# Patient Record
Sex: Female | Born: 1950 | Race: Black or African American | Hispanic: No | Marital: Married | Smoking: Never smoker
Health system: Southern US, Community
[De-identification: ages and names within clinical notes are randomized; demographics above are authoritative.]

## PROBLEM LIST (undated history)

## (undated) DIAGNOSIS — S82899A Other fracture of unspecified lower leg, initial encounter for closed fracture: Secondary | ICD-10-CM

## (undated) DIAGNOSIS — M545 Low back pain, unspecified: Secondary | ICD-10-CM

## (undated) DIAGNOSIS — I10 Essential (primary) hypertension: Secondary | ICD-10-CM

## (undated) DIAGNOSIS — R011 Cardiac murmur, unspecified: Secondary | ICD-10-CM

## (undated) DIAGNOSIS — IMO0001 Reserved for inherently not codable concepts without codable children: Secondary | ICD-10-CM

## (undated) DIAGNOSIS — J189 Pneumonia, unspecified organism: Secondary | ICD-10-CM

## (undated) DIAGNOSIS — R0789 Other chest pain: Secondary | ICD-10-CM

## (undated) DIAGNOSIS — J449 Chronic obstructive pulmonary disease, unspecified: Secondary | ICD-10-CM

## (undated) DIAGNOSIS — M1990 Unspecified osteoarthritis, unspecified site: Secondary | ICD-10-CM

## (undated) DIAGNOSIS — F329 Major depressive disorder, single episode, unspecified: Secondary | ICD-10-CM

## (undated) DIAGNOSIS — K219 Gastro-esophageal reflux disease without esophagitis: Secondary | ICD-10-CM

## (undated) DIAGNOSIS — R053 Chronic cough: Secondary | ICD-10-CM

## (undated) DIAGNOSIS — C801 Malignant (primary) neoplasm, unspecified: Secondary | ICD-10-CM

## (undated) DIAGNOSIS — F32A Depression, unspecified: Secondary | ICD-10-CM

## (undated) DIAGNOSIS — K635 Polyp of colon: Secondary | ICD-10-CM

## (undated) DIAGNOSIS — E669 Obesity, unspecified: Secondary | ICD-10-CM

## (undated) DIAGNOSIS — G4730 Sleep apnea, unspecified: Secondary | ICD-10-CM

## (undated) DIAGNOSIS — D649 Anemia, unspecified: Secondary | ICD-10-CM

## (undated) DIAGNOSIS — M25569 Pain in unspecified knee: Secondary | ICD-10-CM

## (undated) DIAGNOSIS — R05 Cough: Secondary | ICD-10-CM

## (undated) DIAGNOSIS — G4733 Obstructive sleep apnea (adult) (pediatric): Secondary | ICD-10-CM

## (undated) HISTORY — PX: ABDOMINAL HYSTERECTOMY: SHX81

## (undated) HISTORY — DX: Polyp of colon: K63.5

## (undated) HISTORY — DX: Gastro-esophageal reflux disease without esophagitis: K21.9

## (undated) HISTORY — DX: Low back pain: M54.5

## (undated) HISTORY — DX: Other fracture of unspecified lower leg, initial encounter for closed fracture: S82.899A

## (undated) HISTORY — DX: Obesity, unspecified: E66.9

## (undated) HISTORY — DX: Chronic cough: R05.3

## (undated) HISTORY — DX: Other chest pain: R07.89

## (undated) HISTORY — PX: POLYPECTOMY: SHX149

## (undated) HISTORY — PX: APPENDECTOMY: SHX54

## (undated) HISTORY — DX: Pain in unspecified knee: M25.569

## (undated) HISTORY — DX: Cough: R05

## (undated) HISTORY — DX: Obstructive sleep apnea (adult) (pediatric): G47.33

## (undated) HISTORY — DX: Sleep apnea, unspecified: G47.30

## (undated) HISTORY — DX: Low back pain, unspecified: M54.50

## (undated) HISTORY — DX: Essential (primary) hypertension: I10

## (undated) HISTORY — PX: COLONOSCOPY: SHX174

---

## 1980-01-29 HISTORY — PX: ABDOMINAL HYSTERECTOMY W/ PARTIAL VAGINACTOMY: SUR660

## 1981-01-28 HISTORY — PX: CHOLECYSTECTOMY: SHX55

## 1999-10-31 ENCOUNTER — Encounter: Payer: Self-pay | Admitting: Emergency Medicine

## 1999-10-31 ENCOUNTER — Emergency Department (HOSPITAL_COMMUNITY): Admission: EM | Admit: 1999-10-31 | Discharge: 1999-10-31 | Payer: Self-pay | Admitting: Emergency Medicine

## 2000-05-01 ENCOUNTER — Encounter: Payer: Self-pay | Admitting: Orthopedic Surgery

## 2000-05-01 ENCOUNTER — Ambulatory Visit (HOSPITAL_COMMUNITY): Admission: RE | Admit: 2000-05-01 | Discharge: 2000-05-01 | Payer: Self-pay | Admitting: Orthopedic Surgery

## 2000-05-02 ENCOUNTER — Encounter: Payer: Self-pay | Admitting: Orthopedic Surgery

## 2000-05-02 ENCOUNTER — Ambulatory Visit (HOSPITAL_COMMUNITY): Admission: RE | Admit: 2000-05-02 | Discharge: 2000-05-02 | Payer: Self-pay | Admitting: Orthopedic Surgery

## 2000-05-30 ENCOUNTER — Encounter: Admission: RE | Admit: 2000-05-30 | Discharge: 2000-05-30 | Payer: Self-pay | Admitting: Family Medicine

## 2000-05-30 ENCOUNTER — Encounter: Payer: Self-pay | Admitting: Family Medicine

## 2000-09-01 ENCOUNTER — Emergency Department (HOSPITAL_COMMUNITY): Admission: EM | Admit: 2000-09-01 | Discharge: 2000-09-02 | Payer: Self-pay | Admitting: Emergency Medicine

## 2000-09-02 ENCOUNTER — Encounter: Payer: Self-pay | Admitting: Emergency Medicine

## 2001-01-28 HISTORY — PX: WRIST SURGERY: SHX841

## 2001-08-26 ENCOUNTER — Ambulatory Visit (HOSPITAL_BASED_OUTPATIENT_CLINIC_OR_DEPARTMENT_OTHER): Admission: RE | Admit: 2001-08-26 | Discharge: 2001-08-26 | Payer: Self-pay | Admitting: Orthopedic Surgery

## 2001-09-06 ENCOUNTER — Emergency Department (HOSPITAL_COMMUNITY): Admission: EM | Admit: 2001-09-06 | Discharge: 2001-09-07 | Payer: Self-pay | Admitting: Emergency Medicine

## 2003-01-29 HISTORY — PX: SHOULDER SURGERY: SHX246

## 2004-02-10 ENCOUNTER — Ambulatory Visit: Payer: Self-pay | Admitting: Internal Medicine

## 2004-02-13 ENCOUNTER — Ambulatory Visit: Payer: Self-pay | Admitting: Internal Medicine

## 2004-02-17 ENCOUNTER — Ambulatory Visit: Payer: Self-pay | Admitting: Internal Medicine

## 2004-02-20 ENCOUNTER — Ambulatory Visit: Payer: Self-pay | Admitting: Internal Medicine

## 2004-02-23 ENCOUNTER — Ambulatory Visit: Payer: Self-pay | Admitting: Internal Medicine

## 2004-02-27 ENCOUNTER — Ambulatory Visit: Payer: Self-pay | Admitting: Internal Medicine

## 2004-03-02 ENCOUNTER — Ambulatory Visit: Payer: Self-pay | Admitting: Internal Medicine

## 2004-03-07 ENCOUNTER — Ambulatory Visit (HOSPITAL_COMMUNITY): Admission: RE | Admit: 2004-03-07 | Discharge: 2004-03-07 | Payer: Self-pay | Admitting: Internal Medicine

## 2004-03-09 ENCOUNTER — Ambulatory Visit: Payer: Self-pay | Admitting: Internal Medicine

## 2004-03-12 ENCOUNTER — Ambulatory Visit: Payer: Self-pay | Admitting: Internal Medicine

## 2004-03-16 ENCOUNTER — Ambulatory Visit: Payer: Self-pay | Admitting: Internal Medicine

## 2004-03-20 ENCOUNTER — Ambulatory Visit: Payer: Self-pay | Admitting: Internal Medicine

## 2004-03-26 ENCOUNTER — Ambulatory Visit: Payer: Self-pay | Admitting: Internal Medicine

## 2004-03-30 ENCOUNTER — Ambulatory Visit: Payer: Self-pay | Admitting: Internal Medicine

## 2004-04-03 ENCOUNTER — Ambulatory Visit: Payer: Self-pay | Admitting: Internal Medicine

## 2004-04-10 ENCOUNTER — Ambulatory Visit: Payer: Self-pay | Admitting: Internal Medicine

## 2004-04-13 ENCOUNTER — Ambulatory Visit: Payer: Self-pay | Admitting: Internal Medicine

## 2004-04-17 ENCOUNTER — Ambulatory Visit: Payer: Self-pay | Admitting: Internal Medicine

## 2004-04-24 ENCOUNTER — Ambulatory Visit: Payer: Self-pay | Admitting: Internal Medicine

## 2004-05-02 ENCOUNTER — Ambulatory Visit: Payer: Self-pay | Admitting: Internal Medicine

## 2004-05-14 ENCOUNTER — Ambulatory Visit: Payer: Self-pay | Admitting: Internal Medicine

## 2004-05-29 ENCOUNTER — Ambulatory Visit: Payer: Self-pay | Admitting: Internal Medicine

## 2004-05-30 ENCOUNTER — Ambulatory Visit: Payer: Self-pay | Admitting: Internal Medicine

## 2004-06-07 ENCOUNTER — Ambulatory Visit: Payer: Self-pay | Admitting: Internal Medicine

## 2004-06-14 ENCOUNTER — Ambulatory Visit: Payer: Self-pay | Admitting: Internal Medicine

## 2004-06-21 ENCOUNTER — Ambulatory Visit: Payer: Self-pay | Admitting: Internal Medicine

## 2004-06-29 ENCOUNTER — Ambulatory Visit: Payer: Self-pay | Admitting: Internal Medicine

## 2004-06-29 ENCOUNTER — Encounter (INDEPENDENT_AMBULATORY_CARE_PROVIDER_SITE_OTHER): Payer: Self-pay | Admitting: *Deleted

## 2004-07-17 ENCOUNTER — Ambulatory Visit: Payer: Self-pay | Admitting: Internal Medicine

## 2004-08-14 ENCOUNTER — Ambulatory Visit: Payer: Self-pay | Admitting: Internal Medicine

## 2004-08-29 ENCOUNTER — Ambulatory Visit: Payer: Self-pay | Admitting: Internal Medicine

## 2004-09-18 ENCOUNTER — Ambulatory Visit: Payer: Self-pay | Admitting: Internal Medicine

## 2004-11-28 ENCOUNTER — Ambulatory Visit: Payer: Self-pay | Admitting: Internal Medicine

## 2004-12-05 ENCOUNTER — Ambulatory Visit: Payer: Self-pay | Admitting: Internal Medicine

## 2004-12-11 ENCOUNTER — Ambulatory Visit: Payer: Self-pay | Admitting: Internal Medicine

## 2004-12-17 ENCOUNTER — Ambulatory Visit: Payer: Self-pay | Admitting: Internal Medicine

## 2005-01-02 ENCOUNTER — Ambulatory Visit: Payer: Self-pay | Admitting: Internal Medicine

## 2005-01-16 ENCOUNTER — Ambulatory Visit: Payer: Self-pay | Admitting: Internal Medicine

## 2005-01-31 ENCOUNTER — Ambulatory Visit: Payer: Self-pay | Admitting: Internal Medicine

## 2005-02-05 ENCOUNTER — Ambulatory Visit: Payer: Self-pay | Admitting: Internal Medicine

## 2005-02-13 ENCOUNTER — Ambulatory Visit: Payer: Self-pay | Admitting: Internal Medicine

## 2005-02-20 ENCOUNTER — Ambulatory Visit: Payer: Self-pay | Admitting: Internal Medicine

## 2005-02-21 ENCOUNTER — Ambulatory Visit: Payer: Self-pay | Admitting: Internal Medicine

## 2005-02-26 ENCOUNTER — Ambulatory Visit: Payer: Self-pay | Admitting: Internal Medicine

## 2005-03-08 ENCOUNTER — Ambulatory Visit: Payer: Self-pay | Admitting: Internal Medicine

## 2005-03-14 ENCOUNTER — Ambulatory Visit: Payer: Self-pay | Admitting: Internal Medicine

## 2005-03-19 ENCOUNTER — Ambulatory Visit: Payer: Self-pay | Admitting: Internal Medicine

## 2005-03-29 ENCOUNTER — Ambulatory Visit: Payer: Self-pay | Admitting: Internal Medicine

## 2005-04-10 ENCOUNTER — Ambulatory Visit: Payer: Self-pay | Admitting: Internal Medicine

## 2005-04-18 ENCOUNTER — Ambulatory Visit: Payer: Self-pay | Admitting: Internal Medicine

## 2005-04-25 ENCOUNTER — Ambulatory Visit: Payer: Self-pay | Admitting: Internal Medicine

## 2005-05-02 ENCOUNTER — Ambulatory Visit: Payer: Self-pay | Admitting: Internal Medicine

## 2005-05-09 ENCOUNTER — Ambulatory Visit: Payer: Self-pay | Admitting: Internal Medicine

## 2005-05-15 ENCOUNTER — Ambulatory Visit: Payer: Self-pay | Admitting: Internal Medicine

## 2005-05-21 ENCOUNTER — Ambulatory Visit: Payer: Self-pay | Admitting: Internal Medicine

## 2005-05-29 ENCOUNTER — Ambulatory Visit: Payer: Self-pay | Admitting: Internal Medicine

## 2005-06-04 ENCOUNTER — Ambulatory Visit: Payer: Self-pay | Admitting: Internal Medicine

## 2005-06-05 ENCOUNTER — Ambulatory Visit: Payer: Self-pay | Admitting: Internal Medicine

## 2005-06-11 ENCOUNTER — Ambulatory Visit: Payer: Self-pay | Admitting: Internal Medicine

## 2005-06-18 ENCOUNTER — Ambulatory Visit: Payer: Self-pay | Admitting: Internal Medicine

## 2005-07-01 ENCOUNTER — Ambulatory Visit: Payer: Self-pay | Admitting: Internal Medicine

## 2005-07-08 ENCOUNTER — Ambulatory Visit: Payer: Self-pay | Admitting: Internal Medicine

## 2005-07-15 ENCOUNTER — Ambulatory Visit: Payer: Self-pay | Admitting: Internal Medicine

## 2005-08-13 ENCOUNTER — Ambulatory Visit: Payer: Self-pay | Admitting: Internal Medicine

## 2005-08-22 ENCOUNTER — Ambulatory Visit: Payer: Self-pay | Admitting: Internal Medicine

## 2005-08-27 ENCOUNTER — Ambulatory Visit: Payer: Self-pay | Admitting: Internal Medicine

## 2005-09-10 ENCOUNTER — Ambulatory Visit: Payer: Self-pay | Admitting: Internal Medicine

## 2005-09-24 ENCOUNTER — Ambulatory Visit: Payer: Self-pay | Admitting: Internal Medicine

## 2005-10-08 ENCOUNTER — Ambulatory Visit: Payer: Self-pay | Admitting: Internal Medicine

## 2005-10-17 ENCOUNTER — Ambulatory Visit: Payer: Self-pay | Admitting: Internal Medicine

## 2005-10-24 ENCOUNTER — Ambulatory Visit: Payer: Self-pay | Admitting: Internal Medicine

## 2005-10-29 ENCOUNTER — Ambulatory Visit: Payer: Self-pay | Admitting: Internal Medicine

## 2005-11-05 ENCOUNTER — Ambulatory Visit: Payer: Self-pay | Admitting: Internal Medicine

## 2005-11-13 ENCOUNTER — Ambulatory Visit: Payer: Self-pay | Admitting: Internal Medicine

## 2005-11-14 ENCOUNTER — Ambulatory Visit: Payer: Self-pay | Admitting: Internal Medicine

## 2005-11-28 ENCOUNTER — Ambulatory Visit: Payer: Self-pay | Admitting: Internal Medicine

## 2005-12-06 ENCOUNTER — Ambulatory Visit: Payer: Self-pay | Admitting: Internal Medicine

## 2005-12-12 ENCOUNTER — Ambulatory Visit (HOSPITAL_BASED_OUTPATIENT_CLINIC_OR_DEPARTMENT_OTHER): Admission: RE | Admit: 2005-12-12 | Discharge: 2005-12-12 | Payer: Self-pay | Admitting: Internal Medicine

## 2005-12-13 ENCOUNTER — Ambulatory Visit: Payer: Self-pay | Admitting: Internal Medicine

## 2005-12-15 ENCOUNTER — Ambulatory Visit: Payer: Self-pay | Admitting: Internal Medicine

## 2005-12-31 ENCOUNTER — Ambulatory Visit: Payer: Self-pay | Admitting: Internal Medicine

## 2006-01-06 ENCOUNTER — Ambulatory Visit: Payer: Self-pay | Admitting: Internal Medicine

## 2006-01-16 ENCOUNTER — Ambulatory Visit: Payer: Self-pay | Admitting: Internal Medicine

## 2006-01-23 ENCOUNTER — Ambulatory Visit: Payer: Self-pay | Admitting: Internal Medicine

## 2006-02-03 ENCOUNTER — Ambulatory Visit: Payer: Self-pay | Admitting: Internal Medicine

## 2006-02-11 ENCOUNTER — Ambulatory Visit: Payer: Self-pay | Admitting: Internal Medicine

## 2006-02-28 ENCOUNTER — Ambulatory Visit: Payer: Self-pay | Admitting: Internal Medicine

## 2006-03-07 ENCOUNTER — Ambulatory Visit: Payer: Self-pay | Admitting: Internal Medicine

## 2006-03-10 ENCOUNTER — Ambulatory Visit: Payer: Self-pay | Admitting: Internal Medicine

## 2006-03-28 ENCOUNTER — Ambulatory Visit: Payer: Self-pay | Admitting: Internal Medicine

## 2006-04-01 ENCOUNTER — Ambulatory Visit: Payer: Self-pay | Admitting: Internal Medicine

## 2006-04-04 ENCOUNTER — Ambulatory Visit: Payer: Self-pay | Admitting: Internal Medicine

## 2006-04-08 ENCOUNTER — Ambulatory Visit: Payer: Self-pay | Admitting: Internal Medicine

## 2006-04-08 LAB — CONVERTED CEMR LAB
Alkaline Phosphatase: 151 units/L — ABNORMAL HIGH (ref 39–117)
BUN: 12 mg/dL (ref 6–23)
Basophils Relative: 0.7 % (ref 0.0–1.0)
Bilirubin Urine: NEGATIVE
CO2: 32 meq/L (ref 19–32)
Creatinine, Ser: 0.9 mg/dL (ref 0.4–1.2)
Crystals: NEGATIVE
Eosinophils Relative: 2.4 % (ref 0.0–5.0)
Hemoglobin: 12.4 g/dL (ref 12.0–15.0)
Lymphocytes Relative: 26.5 % (ref 12.0–46.0)
Monocytes Absolute: 0.7 10*3/uL (ref 0.2–0.7)
Mucus, UA: NEGATIVE
Neutro Abs: 4.9 10*3/uL (ref 1.4–7.7)
Neutrophils Relative %: 61.4 % (ref 43.0–77.0)
Nitrite: NEGATIVE
Potassium: 3.3 meq/L — ABNORMAL LOW (ref 3.5–5.1)
Total Bilirubin: 0.7 mg/dL (ref 0.3–1.2)
Total Protein, Urine: NEGATIVE mg/dL
Total Protein: 7.1 g/dL (ref 6.0–8.3)
Urine Glucose: NEGATIVE mg/dL
Urobilinogen, UA: 0.2 (ref 0.0–1.0)
VLDL: 15 mg/dL (ref 0–40)
WBC: 8 10*3/uL (ref 4.5–10.5)

## 2006-04-16 ENCOUNTER — Ambulatory Visit: Payer: Self-pay | Admitting: Internal Medicine

## 2006-04-23 ENCOUNTER — Ambulatory Visit: Payer: Self-pay | Admitting: Internal Medicine

## 2006-05-05 ENCOUNTER — Ambulatory Visit: Payer: Self-pay | Admitting: Internal Medicine

## 2006-05-13 ENCOUNTER — Ambulatory Visit: Payer: Self-pay | Admitting: Internal Medicine

## 2006-05-20 ENCOUNTER — Ambulatory Visit: Payer: Self-pay | Admitting: Internal Medicine

## 2006-06-06 ENCOUNTER — Ambulatory Visit: Payer: Self-pay | Admitting: Internal Medicine

## 2006-06-18 ENCOUNTER — Ambulatory Visit: Payer: Self-pay | Admitting: Internal Medicine

## 2006-07-03 ENCOUNTER — Ambulatory Visit: Payer: Self-pay | Admitting: Internal Medicine

## 2006-07-17 ENCOUNTER — Ambulatory Visit: Payer: Self-pay | Admitting: Internal Medicine

## 2006-07-30 ENCOUNTER — Ambulatory Visit: Payer: Self-pay | Admitting: Internal Medicine

## 2006-08-04 ENCOUNTER — Ambulatory Visit: Payer: Self-pay | Admitting: Internal Medicine

## 2006-08-22 ENCOUNTER — Ambulatory Visit: Payer: Self-pay | Admitting: Internal Medicine

## 2006-09-02 ENCOUNTER — Ambulatory Visit: Payer: Self-pay | Admitting: Internal Medicine

## 2006-09-16 ENCOUNTER — Ambulatory Visit: Payer: Self-pay | Admitting: Internal Medicine

## 2006-09-17 ENCOUNTER — Ambulatory Visit: Payer: Self-pay | Admitting: Internal Medicine

## 2006-09-23 ENCOUNTER — Ambulatory Visit: Payer: Self-pay | Admitting: Internal Medicine

## 2006-10-06 ENCOUNTER — Ambulatory Visit: Payer: Self-pay | Admitting: Internal Medicine

## 2006-10-16 ENCOUNTER — Ambulatory Visit: Payer: Self-pay | Admitting: Pulmonary Disease

## 2006-10-16 ENCOUNTER — Ambulatory Visit: Payer: Self-pay | Admitting: Internal Medicine

## 2006-10-17 DIAGNOSIS — G4733 Obstructive sleep apnea (adult) (pediatric): Secondary | ICD-10-CM

## 2006-10-17 DIAGNOSIS — E669 Obesity, unspecified: Secondary | ICD-10-CM

## 2006-10-17 DIAGNOSIS — I10 Essential (primary) hypertension: Secondary | ICD-10-CM

## 2006-10-30 ENCOUNTER — Ambulatory Visit: Payer: Self-pay | Admitting: Internal Medicine

## 2006-11-12 ENCOUNTER — Ambulatory Visit: Payer: Self-pay | Admitting: Internal Medicine

## 2006-11-18 ENCOUNTER — Encounter: Payer: Self-pay | Admitting: Internal Medicine

## 2006-11-18 ENCOUNTER — Ambulatory Visit: Payer: Self-pay | Admitting: Internal Medicine

## 2006-11-18 DIAGNOSIS — Z860100 Personal history of colon polyps, unspecified: Secondary | ICD-10-CM | POA: Insufficient documentation

## 2006-11-26 ENCOUNTER — Ambulatory Visit: Payer: Self-pay | Admitting: Internal Medicine

## 2006-12-10 ENCOUNTER — Ambulatory Visit: Payer: Self-pay | Admitting: Internal Medicine

## 2006-12-17 ENCOUNTER — Ambulatory Visit: Payer: Self-pay | Admitting: Internal Medicine

## 2007-01-02 ENCOUNTER — Ambulatory Visit: Payer: Self-pay | Admitting: Internal Medicine

## 2007-01-15 ENCOUNTER — Ambulatory Visit: Payer: Self-pay | Admitting: Internal Medicine

## 2007-01-20 ENCOUNTER — Ambulatory Visit: Payer: Self-pay | Admitting: Internal Medicine

## 2007-02-04 ENCOUNTER — Ambulatory Visit: Payer: Self-pay | Admitting: Internal Medicine

## 2007-02-04 DIAGNOSIS — M25569 Pain in unspecified knee: Secondary | ICD-10-CM | POA: Insufficient documentation

## 2007-02-04 DIAGNOSIS — S139XXA Sprain of joints and ligaments of unspecified parts of neck, initial encounter: Secondary | ICD-10-CM

## 2007-02-04 DIAGNOSIS — M545 Low back pain: Secondary | ICD-10-CM

## 2007-02-04 DIAGNOSIS — M25559 Pain in unspecified hip: Secondary | ICD-10-CM | POA: Insufficient documentation

## 2007-02-05 ENCOUNTER — Encounter: Payer: Self-pay | Admitting: Internal Medicine

## 2007-02-10 ENCOUNTER — Ambulatory Visit: Payer: Self-pay | Admitting: Internal Medicine

## 2007-02-10 ENCOUNTER — Telehealth: Payer: Self-pay | Admitting: Internal Medicine

## 2007-02-19 ENCOUNTER — Ambulatory Visit: Payer: Self-pay | Admitting: Internal Medicine

## 2007-02-23 ENCOUNTER — Encounter: Payer: Self-pay | Admitting: Internal Medicine

## 2007-02-23 ENCOUNTER — Telehealth (INDEPENDENT_AMBULATORY_CARE_PROVIDER_SITE_OTHER): Payer: Self-pay | Admitting: *Deleted

## 2007-02-24 ENCOUNTER — Ambulatory Visit: Payer: Self-pay | Admitting: Internal Medicine

## 2007-02-25 ENCOUNTER — Ambulatory Visit: Payer: Self-pay | Admitting: Internal Medicine

## 2007-02-25 DIAGNOSIS — R071 Chest pain on breathing: Secondary | ICD-10-CM

## 2007-02-26 ENCOUNTER — Ambulatory Visit: Payer: Self-pay | Admitting: Internal Medicine

## 2007-03-05 ENCOUNTER — Ambulatory Visit: Payer: Self-pay | Admitting: Internal Medicine

## 2007-03-13 ENCOUNTER — Ambulatory Visit: Payer: Self-pay | Admitting: Internal Medicine

## 2007-03-17 ENCOUNTER — Telehealth: Payer: Self-pay | Admitting: Internal Medicine

## 2007-03-25 ENCOUNTER — Ambulatory Visit: Payer: Self-pay | Admitting: Internal Medicine

## 2007-03-31 ENCOUNTER — Ambulatory Visit: Payer: Self-pay | Admitting: Internal Medicine

## 2007-04-05 ENCOUNTER — Encounter: Admission: RE | Admit: 2007-04-05 | Discharge: 2007-04-05 | Payer: Self-pay | Admitting: Orthopedic Surgery

## 2007-04-08 ENCOUNTER — Ambulatory Visit: Payer: Self-pay | Admitting: Internal Medicine

## 2007-04-21 ENCOUNTER — Ambulatory Visit: Payer: Self-pay | Admitting: Internal Medicine

## 2007-04-29 ENCOUNTER — Ambulatory Visit: Payer: Self-pay | Admitting: Internal Medicine

## 2007-04-29 ENCOUNTER — Encounter: Payer: Self-pay | Admitting: Internal Medicine

## 2007-05-06 ENCOUNTER — Ambulatory Visit: Payer: Self-pay | Admitting: Internal Medicine

## 2007-05-08 ENCOUNTER — Encounter: Payer: Self-pay | Admitting: Internal Medicine

## 2007-05-14 ENCOUNTER — Ambulatory Visit: Payer: Self-pay | Admitting: Internal Medicine

## 2007-05-14 ENCOUNTER — Telehealth: Payer: Self-pay | Admitting: Internal Medicine

## 2007-05-19 ENCOUNTER — Ambulatory Visit: Payer: Self-pay | Admitting: Internal Medicine

## 2007-05-19 DIAGNOSIS — R7309 Other abnormal glucose: Secondary | ICD-10-CM

## 2007-05-20 LAB — CONVERTED CEMR LAB
BUN: 21 mg/dL (ref 6–23)
Bilirubin Urine: NEGATIVE
CO2: 31 meq/L (ref 19–32)
Calcium: 9.3 mg/dL (ref 8.4–10.5)
Creatinine, Ser: 1.1 mg/dL (ref 0.4–1.2)
Glucose, Bld: 109 mg/dL — ABNORMAL HIGH (ref 70–99)
Hemoglobin, Urine: NEGATIVE
Ketones, ur: NEGATIVE mg/dL
Nitrite: NEGATIVE
Total Protein, Urine: NEGATIVE mg/dL
Urine Glucose: NEGATIVE mg/dL

## 2007-06-04 ENCOUNTER — Ambulatory Visit: Payer: Self-pay | Admitting: Internal Medicine

## 2007-06-10 ENCOUNTER — Ambulatory Visit: Payer: Self-pay | Admitting: Internal Medicine

## 2007-06-23 ENCOUNTER — Ambulatory Visit: Payer: Self-pay | Admitting: Internal Medicine

## 2007-07-02 ENCOUNTER — Ambulatory Visit: Payer: Self-pay | Admitting: Internal Medicine

## 2007-07-02 DIAGNOSIS — R059 Cough, unspecified: Secondary | ICD-10-CM | POA: Insufficient documentation

## 2007-07-02 DIAGNOSIS — J309 Allergic rhinitis, unspecified: Secondary | ICD-10-CM | POA: Insufficient documentation

## 2007-07-02 DIAGNOSIS — R05 Cough: Secondary | ICD-10-CM

## 2007-07-03 ENCOUNTER — Ambulatory Visit: Payer: Self-pay | Admitting: Internal Medicine

## 2007-07-13 ENCOUNTER — Telehealth (INDEPENDENT_AMBULATORY_CARE_PROVIDER_SITE_OTHER): Payer: Self-pay | Admitting: *Deleted

## 2007-07-13 ENCOUNTER — Ambulatory Visit: Payer: Self-pay | Admitting: Internal Medicine

## 2007-07-16 ENCOUNTER — Ambulatory Visit: Payer: Self-pay | Admitting: Internal Medicine

## 2007-07-22 ENCOUNTER — Ambulatory Visit: Payer: Self-pay | Admitting: Internal Medicine

## 2007-07-23 ENCOUNTER — Ambulatory Visit: Payer: Self-pay | Admitting: Internal Medicine

## 2007-07-27 ENCOUNTER — Ambulatory Visit: Payer: Self-pay | Admitting: Internal Medicine

## 2007-08-12 ENCOUNTER — Ambulatory Visit: Payer: Self-pay | Admitting: Internal Medicine

## 2007-08-19 ENCOUNTER — Ambulatory Visit: Payer: Self-pay | Admitting: Internal Medicine

## 2007-08-27 ENCOUNTER — Ambulatory Visit: Payer: Self-pay | Admitting: Internal Medicine

## 2007-09-08 ENCOUNTER — Ambulatory Visit: Payer: Self-pay | Admitting: Internal Medicine

## 2007-09-23 ENCOUNTER — Ambulatory Visit: Payer: Self-pay | Admitting: Internal Medicine

## 2007-10-02 ENCOUNTER — Ambulatory Visit: Payer: Self-pay | Admitting: Internal Medicine

## 2007-10-13 ENCOUNTER — Ambulatory Visit: Payer: Self-pay | Admitting: Internal Medicine

## 2007-10-19 ENCOUNTER — Encounter: Payer: Self-pay | Admitting: Internal Medicine

## 2007-10-29 ENCOUNTER — Ambulatory Visit: Payer: Self-pay | Admitting: Internal Medicine

## 2007-11-09 ENCOUNTER — Ambulatory Visit: Payer: Self-pay | Admitting: Internal Medicine

## 2007-11-17 ENCOUNTER — Ambulatory Visit: Payer: Self-pay | Admitting: Internal Medicine

## 2007-11-27 ENCOUNTER — Telehealth: Payer: Self-pay | Admitting: Internal Medicine

## 2007-12-09 ENCOUNTER — Ambulatory Visit: Payer: Self-pay | Admitting: Internal Medicine

## 2007-12-18 ENCOUNTER — Ambulatory Visit: Payer: Self-pay | Admitting: Internal Medicine

## 2007-12-21 ENCOUNTER — Ambulatory Visit: Payer: Self-pay | Admitting: Internal Medicine

## 2007-12-30 ENCOUNTER — Ambulatory Visit: Payer: Self-pay | Admitting: Internal Medicine

## 2007-12-30 ENCOUNTER — Encounter: Payer: Self-pay | Admitting: Internal Medicine

## 2008-01-11 ENCOUNTER — Ambulatory Visit: Payer: Self-pay | Admitting: Internal Medicine

## 2008-01-18 ENCOUNTER — Ambulatory Visit: Payer: Self-pay | Admitting: Internal Medicine

## 2008-02-02 ENCOUNTER — Ambulatory Visit: Payer: Self-pay | Admitting: Internal Medicine

## 2008-02-02 DIAGNOSIS — J209 Acute bronchitis, unspecified: Secondary | ICD-10-CM | POA: Insufficient documentation

## 2008-02-10 ENCOUNTER — Ambulatory Visit: Payer: Self-pay | Admitting: Internal Medicine

## 2008-02-16 ENCOUNTER — Ambulatory Visit: Payer: Self-pay | Admitting: Internal Medicine

## 2008-03-02 ENCOUNTER — Ambulatory Visit: Payer: Self-pay | Admitting: Internal Medicine

## 2008-03-08 ENCOUNTER — Ambulatory Visit: Payer: Self-pay | Admitting: Internal Medicine

## 2008-03-10 ENCOUNTER — Telehealth: Payer: Self-pay | Admitting: Internal Medicine

## 2008-03-22 ENCOUNTER — Ambulatory Visit: Payer: Self-pay | Admitting: Internal Medicine

## 2008-03-30 ENCOUNTER — Ambulatory Visit: Payer: Self-pay | Admitting: Internal Medicine

## 2008-04-05 ENCOUNTER — Ambulatory Visit: Payer: Self-pay | Admitting: Internal Medicine

## 2008-04-11 ENCOUNTER — Ambulatory Visit: Payer: Self-pay | Admitting: Internal Medicine

## 2008-04-18 ENCOUNTER — Ambulatory Visit: Payer: Self-pay | Admitting: Internal Medicine

## 2008-05-04 ENCOUNTER — Ambulatory Visit: Payer: Self-pay | Admitting: Internal Medicine

## 2008-05-13 ENCOUNTER — Ambulatory Visit: Payer: Self-pay | Admitting: Internal Medicine

## 2008-05-16 ENCOUNTER — Ambulatory Visit: Payer: Self-pay | Admitting: Internal Medicine

## 2008-05-20 ENCOUNTER — Ambulatory Visit: Payer: Self-pay | Admitting: Internal Medicine

## 2008-06-01 ENCOUNTER — Ambulatory Visit: Payer: Self-pay | Admitting: Internal Medicine

## 2008-06-08 ENCOUNTER — Ambulatory Visit: Payer: Self-pay | Admitting: Internal Medicine

## 2008-06-13 ENCOUNTER — Ambulatory Visit: Payer: Self-pay | Admitting: Internal Medicine

## 2008-06-22 ENCOUNTER — Ambulatory Visit: Payer: Self-pay | Admitting: Internal Medicine

## 2008-06-30 ENCOUNTER — Ambulatory Visit: Payer: Self-pay | Admitting: Internal Medicine

## 2008-07-06 ENCOUNTER — Ambulatory Visit: Payer: Self-pay | Admitting: Internal Medicine

## 2008-07-11 ENCOUNTER — Ambulatory Visit: Payer: Self-pay | Admitting: Internal Medicine

## 2008-07-20 ENCOUNTER — Ambulatory Visit: Payer: Self-pay | Admitting: Internal Medicine

## 2008-07-25 ENCOUNTER — Ambulatory Visit: Payer: Self-pay | Admitting: Internal Medicine

## 2008-08-03 ENCOUNTER — Ambulatory Visit: Payer: Self-pay | Admitting: Internal Medicine

## 2008-08-09 ENCOUNTER — Ambulatory Visit: Payer: Self-pay | Admitting: Internal Medicine

## 2008-08-17 ENCOUNTER — Ambulatory Visit: Payer: Self-pay | Admitting: Internal Medicine

## 2008-08-31 ENCOUNTER — Ambulatory Visit: Payer: Self-pay | Admitting: Internal Medicine

## 2008-09-05 ENCOUNTER — Ambulatory Visit: Payer: Self-pay | Admitting: Internal Medicine

## 2008-09-06 ENCOUNTER — Ambulatory Visit: Payer: Self-pay | Admitting: Internal Medicine

## 2008-09-14 ENCOUNTER — Ambulatory Visit: Payer: Self-pay | Admitting: Internal Medicine

## 2008-09-20 ENCOUNTER — Ambulatory Visit: Payer: Self-pay | Admitting: Internal Medicine

## 2008-10-04 ENCOUNTER — Ambulatory Visit: Payer: Self-pay | Admitting: Internal Medicine

## 2008-10-12 ENCOUNTER — Ambulatory Visit: Payer: Self-pay | Admitting: Internal Medicine

## 2008-10-14 ENCOUNTER — Telehealth: Payer: Self-pay | Admitting: Internal Medicine

## 2008-10-18 ENCOUNTER — Ambulatory Visit: Payer: Self-pay | Admitting: Internal Medicine

## 2008-10-27 ENCOUNTER — Ambulatory Visit: Payer: Self-pay | Admitting: Internal Medicine

## 2008-11-01 ENCOUNTER — Ambulatory Visit: Payer: Self-pay | Admitting: Internal Medicine

## 2008-11-01 ENCOUNTER — Encounter: Payer: Self-pay | Admitting: Internal Medicine

## 2008-11-09 ENCOUNTER — Ambulatory Visit: Payer: Self-pay | Admitting: Internal Medicine

## 2008-11-23 ENCOUNTER — Ambulatory Visit: Payer: Self-pay | Admitting: Internal Medicine

## 2008-11-28 ENCOUNTER — Ambulatory Visit: Payer: Self-pay | Admitting: Internal Medicine

## 2008-12-07 ENCOUNTER — Ambulatory Visit: Payer: Self-pay | Admitting: Internal Medicine

## 2008-12-21 ENCOUNTER — Ambulatory Visit: Payer: Self-pay | Admitting: Internal Medicine

## 2008-12-30 ENCOUNTER — Ambulatory Visit: Payer: Self-pay | Admitting: Internal Medicine

## 2009-01-04 ENCOUNTER — Ambulatory Visit: Payer: Self-pay | Admitting: Internal Medicine

## 2009-01-09 ENCOUNTER — Ambulatory Visit: Payer: Self-pay | Admitting: Internal Medicine

## 2009-01-24 ENCOUNTER — Telehealth (INDEPENDENT_AMBULATORY_CARE_PROVIDER_SITE_OTHER): Payer: Self-pay | Admitting: *Deleted

## 2009-02-01 ENCOUNTER — Ambulatory Visit: Payer: Self-pay | Admitting: Internal Medicine

## 2009-02-02 ENCOUNTER — Ambulatory Visit: Payer: Self-pay | Admitting: Internal Medicine

## 2009-02-06 ENCOUNTER — Ambulatory Visit: Payer: Self-pay | Admitting: Internal Medicine

## 2009-02-23 ENCOUNTER — Ambulatory Visit: Payer: Self-pay | Admitting: Internal Medicine

## 2009-03-02 ENCOUNTER — Ambulatory Visit: Payer: Self-pay | Admitting: Internal Medicine

## 2009-03-20 ENCOUNTER — Ambulatory Visit: Payer: Self-pay | Admitting: Internal Medicine

## 2009-04-06 ENCOUNTER — Inpatient Hospital Stay (HOSPITAL_COMMUNITY): Admission: RE | Admit: 2009-04-06 | Discharge: 2009-04-12 | Payer: Self-pay | Admitting: Orthopedic Surgery

## 2009-04-10 ENCOUNTER — Ambulatory Visit: Payer: Self-pay | Admitting: Physical Medicine & Rehabilitation

## 2009-04-12 ENCOUNTER — Encounter: Payer: Self-pay | Admitting: Family Medicine

## 2009-04-12 DIAGNOSIS — S82899A Other fracture of unspecified lower leg, initial encounter for closed fracture: Secondary | ICD-10-CM

## 2009-04-12 HISTORY — DX: Other fracture of unspecified lower leg, initial encounter for closed fracture: S82.899A

## 2009-04-14 ENCOUNTER — Encounter: Payer: Self-pay | Admitting: Family Medicine

## 2009-04-14 ENCOUNTER — Ambulatory Visit: Payer: Self-pay | Admitting: Family Medicine

## 2009-04-15 ENCOUNTER — Telehealth: Payer: Self-pay | Admitting: Family Medicine

## 2009-04-20 ENCOUNTER — Telehealth (INDEPENDENT_AMBULATORY_CARE_PROVIDER_SITE_OTHER): Payer: Self-pay | Admitting: *Deleted

## 2009-04-28 HISTORY — PX: ANKLE FRACTURE SURGERY: SHX122

## 2009-05-01 ENCOUNTER — Telehealth: Payer: Self-pay | Admitting: Internal Medicine

## 2009-05-01 ENCOUNTER — Encounter: Payer: Self-pay | Admitting: Family Medicine

## 2009-05-17 ENCOUNTER — Ambulatory Visit: Payer: Self-pay | Admitting: Internal Medicine

## 2009-05-18 ENCOUNTER — Telehealth: Payer: Self-pay | Admitting: Internal Medicine

## 2009-05-19 ENCOUNTER — Telehealth (INDEPENDENT_AMBULATORY_CARE_PROVIDER_SITE_OTHER): Payer: Self-pay | Admitting: *Deleted

## 2009-05-22 ENCOUNTER — Encounter: Payer: Self-pay | Admitting: Internal Medicine

## 2009-05-23 ENCOUNTER — Encounter: Payer: Self-pay | Admitting: Internal Medicine

## 2009-05-24 ENCOUNTER — Encounter: Payer: Self-pay | Admitting: Internal Medicine

## 2009-05-24 ENCOUNTER — Ambulatory Visit: Payer: Self-pay | Admitting: Internal Medicine

## 2009-05-25 ENCOUNTER — Telehealth: Payer: Self-pay | Admitting: Internal Medicine

## 2009-05-29 ENCOUNTER — Ambulatory Visit: Payer: Self-pay | Admitting: Internal Medicine

## 2009-06-01 ENCOUNTER — Ambulatory Visit: Payer: Self-pay | Admitting: Internal Medicine

## 2009-06-08 ENCOUNTER — Ambulatory Visit: Payer: Self-pay | Admitting: Internal Medicine

## 2009-06-10 ENCOUNTER — Encounter: Payer: Self-pay | Admitting: Internal Medicine

## 2009-06-15 ENCOUNTER — Ambulatory Visit: Payer: Self-pay | Admitting: Internal Medicine

## 2009-06-28 ENCOUNTER — Encounter: Payer: Self-pay | Admitting: Internal Medicine

## 2009-07-04 ENCOUNTER — Ambulatory Visit: Payer: Self-pay | Admitting: Internal Medicine

## 2009-07-11 ENCOUNTER — Ambulatory Visit: Payer: Self-pay | Admitting: Internal Medicine

## 2009-07-21 ENCOUNTER — Ambulatory Visit: Payer: Self-pay | Admitting: Internal Medicine

## 2009-08-02 ENCOUNTER — Ambulatory Visit: Payer: Self-pay | Admitting: Internal Medicine

## 2009-08-30 ENCOUNTER — Ambulatory Visit: Payer: Self-pay | Admitting: Internal Medicine

## 2009-09-01 ENCOUNTER — Telehealth: Payer: Self-pay | Admitting: Internal Medicine

## 2009-09-05 ENCOUNTER — Telehealth (INDEPENDENT_AMBULATORY_CARE_PROVIDER_SITE_OTHER): Payer: Self-pay | Admitting: *Deleted

## 2009-09-07 ENCOUNTER — Ambulatory Visit: Payer: Self-pay | Admitting: Internal Medicine

## 2009-09-07 LAB — CONVERTED CEMR LAB
ALT: 17 units/L (ref 0–35)
AST: 18 units/L (ref 0–37)
BUN: 16 mg/dL (ref 6–23)
Basophils Relative: 0.8 % (ref 0.0–3.0)
CO2: 30 meq/L (ref 19–32)
Chloride: 103 meq/L (ref 96–112)
Cholesterol: 172 mg/dL (ref 0–200)
Creatinine, Ser: 0.8 mg/dL (ref 0.4–1.2)
Eosinophils Absolute: 0.2 10*3/uL (ref 0.0–0.7)
Eosinophils Relative: 2.9 % (ref 0.0–5.0)
Hemoglobin: 12.5 g/dL (ref 12.0–15.0)
Hgb A1c MFr Bld: 6.2 % (ref 4.6–6.5)
INR: 1 (ref 0.8–1.0)
Ketones, ur: NEGATIVE mg/dL
Lymphocytes Relative: 26.9 % (ref 12.0–46.0)
MCHC: 32.7 g/dL (ref 30.0–36.0)
MCV: 74.2 fL — ABNORMAL LOW (ref 78.0–100.0)
Neutro Abs: 4.2 10*3/uL (ref 1.4–7.7)
Prothrombin Time: 11.2 s (ref 9.7–11.8)
RBC: 5.15 M/uL — ABNORMAL HIGH (ref 3.87–5.11)
Specific Gravity, Urine: 1.015 (ref 1.000–1.030)
Total CHOL/HDL Ratio: 2
Total Protein, Urine: NEGATIVE mg/dL
Total Protein: 7.5 g/dL (ref 6.0–8.3)
Triglycerides: 68 mg/dL (ref 0.0–149.0)
Urine Glucose: NEGATIVE mg/dL
Urobilinogen, UA: 0.2 (ref 0.0–1.0)
WBC: 7 10*3/uL (ref 4.5–10.5)

## 2009-09-08 ENCOUNTER — Telehealth (INDEPENDENT_AMBULATORY_CARE_PROVIDER_SITE_OTHER): Payer: Self-pay | Admitting: *Deleted

## 2009-09-13 ENCOUNTER — Ambulatory Visit: Payer: Self-pay | Admitting: Internal Medicine

## 2009-09-28 ENCOUNTER — Ambulatory Visit: Payer: Self-pay | Admitting: Internal Medicine

## 2009-09-29 ENCOUNTER — Ambulatory Visit: Payer: Self-pay | Admitting: Internal Medicine

## 2009-10-05 ENCOUNTER — Ambulatory Visit: Payer: Self-pay | Admitting: Internal Medicine

## 2009-11-01 ENCOUNTER — Ambulatory Visit: Payer: Self-pay | Admitting: Internal Medicine

## 2009-11-10 ENCOUNTER — Ambulatory Visit: Payer: Self-pay | Admitting: Internal Medicine

## 2009-11-14 ENCOUNTER — Ambulatory Visit: Payer: Self-pay | Admitting: Internal Medicine

## 2009-11-27 ENCOUNTER — Encounter: Payer: Self-pay | Admitting: Internal Medicine

## 2009-11-29 ENCOUNTER — Telehealth: Payer: Self-pay | Admitting: Internal Medicine

## 2009-11-30 ENCOUNTER — Encounter: Payer: Self-pay | Admitting: Internal Medicine

## 2009-12-01 ENCOUNTER — Ambulatory Visit: Payer: Self-pay | Admitting: Internal Medicine

## 2009-12-08 ENCOUNTER — Ambulatory Visit: Payer: Self-pay | Admitting: Internal Medicine

## 2009-12-13 ENCOUNTER — Ambulatory Visit: Payer: Self-pay | Admitting: Internal Medicine

## 2009-12-19 ENCOUNTER — Ambulatory Visit: Payer: Self-pay | Admitting: Internal Medicine

## 2009-12-29 ENCOUNTER — Ambulatory Visit: Payer: Self-pay | Admitting: Internal Medicine

## 2010-01-05 ENCOUNTER — Ambulatory Visit: Payer: Self-pay | Admitting: Internal Medicine

## 2010-01-18 ENCOUNTER — Encounter: Payer: Self-pay | Admitting: Internal Medicine

## 2010-01-23 ENCOUNTER — Ambulatory Visit: Payer: Self-pay | Admitting: Internal Medicine

## 2010-01-30 ENCOUNTER — Ambulatory Visit: Payer: Self-pay | Admitting: Internal Medicine

## 2010-01-31 ENCOUNTER — Ambulatory Visit
Admission: RE | Admit: 2010-01-31 | Discharge: 2010-01-31 | Payer: Self-pay | Source: Home / Self Care | Attending: Internal Medicine | Admitting: Internal Medicine

## 2010-02-10 ENCOUNTER — Ambulatory Visit: Payer: Self-pay | Admitting: Internal Medicine

## 2010-02-15 ENCOUNTER — Ambulatory Visit: Payer: Self-pay | Admitting: Internal Medicine

## 2010-02-16 ENCOUNTER — Ambulatory Visit: Payer: Self-pay | Admitting: Internal Medicine

## 2010-02-28 DIAGNOSIS — J301 Allergic rhinitis due to pollen: Secondary | ICD-10-CM

## 2010-03-01 ENCOUNTER — Ambulatory Visit: Payer: Medicare Other

## 2010-03-01 DIAGNOSIS — J301 Allergic rhinitis due to pollen: Secondary | ICD-10-CM

## 2010-03-01 NOTE — Miscellaneous (Signed)
Summary: Nursing Home Admission   Primary Care Provider:  Plotnikov   History of Present Illness: Admission to [facility] for rehabilitation after ORIF of right ankle 04/06/09 s/p fall at home.  Trimalleolar fracture, now non weight bearing.  Currently anticoagulated for DVT prophylaxis. Discharged from hospital 04/12/09. Pt states that she is doing well, in general. Her pain is about 5/10 and controlled with oral percocet at this time.  PMH significant for morbid obesity, GERD, asthma, sleep apnea on CPAP, allergic rhinitis and HTN.  Current Medications (verified): 1)  Advair Diskus 250-50 Mcg/dose  Misc (Fluticasone-Salmeterol) .... Inhale 1 Puff Two Times A Day 2)  Proair Hfa 108 (90 Base) Mcg/act  Aers (Albuterol Sulfate) .Marland Kitchen.. 1-2 Puffs Every 4-6 Hours As Needed 3)  Albuterol Sulfate (2.5 Mg/56ml) 0.083%  Nebu (Albuterol Sulfate) .... As Needed 4)  Allergy Vaccine 1:10 Gh 5)  Cpap   9 .... At Bedtime 6)  Benicar 20 Mg Tabs (Olmesartan Medoxomil) .... One By Mouth Dail For Blood Pressure 7)  Protonix 40 Mg Tbec (Pantoprazole Sodium) .... One By Mouth Daily For Gerd 8)  Percocet 5-325 Mg Tabs (Oxycodone-Acetaminophen) .Marland Kitchen.. 1-2 Q 4 Hours As Needed Pain 9)  Claritin 10 Mg Tabs (Loratadine) .... One By Mouth Dail For Allergic Rhinitis 10)  Coumadin 5 Mg Tabs (Warfarin Sodium) .... As Directed  Allergies (verified): No Known Drug Allergies  Past History:  Past Surgical History: Appendectomy in 1970s Cholecystectomy 1983 partial Abdominal Hysterectomy (has one remaining ovary) 1982 R wrist surgery 2003 R shoulder surgery 2005  Social History: Occupation: disabled due to shoulder and hand injuries Married with 2 children Never Smoked, no alcohol, no drugs Sons: [name] [phone] [name] [phone]  Review of Systems       denies chest pain, SOB, bowel or bladder problems. No problems with appetite or thirst. Balance of ROS negative as  well.   Physical Exam  General:  obese, alert, pleaseant, talkative.  Eyes:  EOMI, sclerae clear Mouth:  MMM Lungs:  work of breathing unlabored, clear to auscultation bilaterally; no wheezes, rales, or ronchi; good air movement throughout  Heart:  regular rate and rhythm, no murmurs; normal s1/s2  Abdomen:  +BS, soft, non-tender, non-distended; no masses; no rebound or guarding  Extremities:  trace LLE pitting edema (2+ DP pulse)  2+ non-pitting edema LLE; ankle wrapped at time of exam. no erythema, drainage, or discharge.  Neurologic:  alert and oriented. speech normal.  no gross deficitis. converses normal with normal memory and concentration Skin:  warm, good turgor; no rashes or lesions. brisk cap refill  Psych:  alert and oriented. full affect, normally interactive. Good eye contact.    Impression & Recommendations:  Problem # 1:  FRACTURE, ANKLE, RIGHT (ICD-824.8) Assessment New R trimalleolar ankle fracture s/p ORIF 3/10. Here now for rehab. Will continue percocet for pain control. PT/OT for rehab and monitor progress.   Problem # 2:  ENCOUNTER FOR LONG-TERM USE OF ANTICOAGULANTS (ICD-V58.61) Pt d/c'd on coumadin for DVT prophylaxis. No time dictated in d/c summary. Will plan for 4 weeks of therapy. INR today 3.1 on 5 mg coumadin daily. INR at d/c 2.1 (3/16). Will hold today's dose and start 2.5 mg through the weekend. Recheck level on Monday.   Problem # 3:  HYPERTENSION (ICD-401.9) Continue HCTZ and benicar. Check CMP.  Her updated medication list for this problem includes:    Benicar 20 Mg Tabs (Olmesartan medoxomil) ..... One by mouth dail  for blood pressure  Problem # 4:  ASTHMA (ICD-493.90) continue advair and albuterol. Respiratory status stable at this time.   The following medications were removed from the medication list:    Singulair 10 Mg Tabs (Montelukast sodium) ..... Once daily Her updated medication list for this problem includes:    Advair Diskus 250-50  Mcg/dose Misc (Fluticasone-salmeterol) ..... Inhale 1 puff two times a day    Proair Hfa 108 (90 Base) Mcg/act Aers (Albuterol sulfate) .Marland Kitchen... 1-2 puffs every 4-6 hours as needed    Albuterol Sulfate (2.5 Mg/48ml) 0.083% Nebu (Albuterol sulfate) .Marland Kitchen... As needed  Problem # 5:  OBSTRUCTIVE SLEEP APNEA (ICD-327.23) continue CPAP. Complicated by morbid obesity.  Problem # 6:  GERD (ICD-530.81) continue PPI. The following medications were removed from the medication list:    Omeprazole 20 Mg Cpdr (Omeprazole) .Marland Kitchen... Take 1 by mouth once daily Her updated medication list for this problem includes:    Protonix 40 Mg Tbec (Pantoprazole sodium) ..... One by mouth daily for gerd  Problem # 7:  OBESITY (ICD-278.00) check a1c  Complete Medication List: 1)  Advair Diskus 250-50 Mcg/dose Misc (Fluticasone-salmeterol) .... Inhale 1 puff two times a day 2)  Proair Hfa 108 (90 Base) Mcg/act Aers (Albuterol sulfate) .Marland Kitchen.. 1-2 puffs every 4-6 hours as needed 3)  Albuterol Sulfate (2.5 Mg/62ml) 0.083% Nebu (Albuterol sulfate) .... As needed 4)  Allergy Vaccine 1:10 Gh  5)  Cpap 9  .... At bedtime 6)  Benicar 20 Mg Tabs (Olmesartan medoxomil) .... One by mouth dail for blood pressure 7)  Protonix 40 Mg Tbec (Pantoprazole sodium) .... One by mouth daily for gerd 8)  Percocet 5-325 Mg Tabs (Oxycodone-acetaminophen) .Marland Kitchen.. 1-2 q 4 hours as needed pain 9)  Claritin 10 Mg Tabs (Loratadine) .... One by mouth dail for allergic rhinitis 10)  Coumadin 5 Mg Tabs (Warfarin sodium) .... As directed  Appended Document: Nursing Home Admission     Vital Signs:  Patient profile:   60 year old female Height:      62 inches Weight:      238.2 pounds BMI:     43.72 Pulse rate:   82 / minute Resp:     20 per minute BP sitting:   130 / 84  Primary Care Provider:  Plotnikov   History of Present Illness: right ankle fracture was from a slip fall. Had been active walking in the yard and going to church. Home is  trilevel.   Allergy shots weekly at Dr [name] office.   No constipation.   Primary care internist: [name]  Habits & Providers     Allergist: [name] Knee     Pulmonologist: Clint Young  Allergies: No Known Drug Allergies  Social History: Occupation: disabled due to shoulder and hand injuries Married with 2 children Has lived in [place], [place] since 1992 Never Smoked, no alcohol, no drugs Sons: Zyan Mirkin [phone] SABRIEL BORROMEO [phone]  Physical Exam  General:  obese, alert, pleaseant, talkative. overweight-appearing.   Head:  Normocephalic and atraumatic without obvious abnormalities. No apparent alopecia or balding. Eyes:  No corneal or conjunctival inflammation noted. EOMI. Perrla. Funduscopic exam benign, without hemorrhages, exudates or papilledema. Vision grossly normal. Ears:  External ear exam shows no significant lesions or deformities.  Otoscopic examination reveals clear canals, tympanic membranes are intact bilaterally without bulging, retraction, inflammation or discharge. Hearing is grossly normal bilaterally. Nose:  External nasal examination shows no deformity or inflammation. Nasal mucosa are pink  and moist without lesions or exudates. Mouth:  fair dentition.  Large cavity left lower molar Neck:  No deformities, masses, or tenderness noted. Lungs:  Normal respiratory effort, chest expands symmetrically. Lungs are clear to auscultation, no crackles or wheezes. Heart:  regular rate and rhythm, no murmurs; normal s1/s2  Abdomen:  +BS, soft, non-tender, non-distended; no masses; no rebound or guarding. Large well healed LUQ scar Msk:  Reduced movement right ankle Extremities:  trace LLE pitting edema (2+ DP pulse)  2+ non-pitting edema LLE; ankle wrapped at time of exam. no erythema, drainage, or discharge. Clean, long sutured verticle surgical incisions both sides of right ankle Neurologic:  alert & oriented X3, cranial nerves  II-XII intact, and finger-to-nose normal.   Skin:  Intact without suspicious lesions or rashes Cervical Nodes:  No lymphadenopathy noted Psych:  normally interactive, good eye contact, and not depressed appearing.     Impression & Recommendations:  Problem # 1:  FRACTURE, ANKLE, RIGHT (ICD-824.8)  Medically stable and active premorbidly. Would start Calcium to go with the Vitamin D3 1000 International Units that she takes at home.   Orders: Provider Misc Charge- [facility] (Misc)  Complete Medication List: 1)  Advair Diskus 250-50 Mcg/dose Misc (Fluticasone-salmeterol) .... Inhale 1 puff two times a day 2)  Proair Hfa 108 (90 Base) Mcg/act Aers (Albuterol sulfate) .Marland Kitchen.. 1-2 puffs every 4-6 hours as needed 3)  Albuterol Sulfate (2.5 Mg/20ml) 0.083% Nebu (Albuterol sulfate) .... As needed 4)  Allergy Vaccine 1:10 Gh  5)  Cpap 9  .... At bedtime 6)  Benicar 20 Mg Tabs (Olmesartan medoxomil) .... One by mouth dail for blood pressure 7)  Protonix 40 Mg Tbec (Pantoprazole sodium) .... One by mouth daily for gerd 8)  Percocet 5-325 Mg Tabs (Oxycodone-acetaminophen) .Marland Kitchen.. 1-2 q 4 hours as needed pain 9)  Claritin 10 Mg Tabs (Loratadine) .... One by mouth dail for allergic rhinitis 10)  Coumadin 5 Mg Tabs (Warfarin sodium) .... As directed  Patient Instructions: 1)  Plan to change Protonix back to Omeprazole, Benicar and HCTZ back to Diovan HCTZ, and Claritin back to Fenofexidine on discharge.      Habits & Providers     Allergist: Clint Young     Pulmonologist: Clint Young  Appended Document: Nursing Home Admission Medications Added VITAMIN D 1000 UNIT TABS (CHOLECALCIFEROL) one by mouth daily CALCIUM 600 1500 MG TABS (CALCIUM CARBONATE) one by mouth two times a day          Clinical Lists Changes  Medications: Added new medication of VITAMIN D 1000 UNIT TABS (CHOLECALCIFEROL) one by mouth daily Added new medication of CALCIUM 600 1500 MG TABS (CALCIUM CARBONATE) one by mouth two  times a day      Appended Document: Nursing Home Admission     Clinical Lists Changes  Observations: Added new observation of PT ROOM#: 123  (04/14/2009 15:53)

## 2010-03-01 NOTE — Medication Information (Signed)
Summary: [facility]chair [facility]  [facility]   Imported By: Sherian Rein 05/30/2009 08:45:02  _____________________________________________________________________  External Attachment:    Type:   Image     Comment:   External Document

## 2010-03-01 NOTE — Progress Notes (Signed)
Summary: REFILLS  Phone Note Call from Patient   Summary of Call: Patient is requesting refills on klor con and singular. Both removed from med list. Please advise.  Initial call taken by: Lamar Sprinkles, CMA,  May 18, 2009 5:10 PM  Follow-up for Phone Call        ok to ref x 12 months both Thx Follow-up by: Tresa Garter MD,  May 22, 2009 1:30 PM    New/Updated Medications: KLOR-CON 10 10 MEQ CR-TABS (POTASSIUM CHLORIDE) 1 once daily SINGULAIR 10 MG TABS (MONTELUKAST SODIUM) 1 once daily Prescriptions: SINGULAIR 10 MG TABS (MONTELUKAST SODIUM) 1 once daily  #90 x 3   Entered by:   Lamar Sprinkles, CMA   Authorized by:   Tresa Garter MD   Signed by:   Lamar Sprinkles, CMA on 05/22/2009   Method used:   [facility] to        [facility]       [address]       [place], [place]  [address]       Ph: [phone]       Fax: [phone]   RxID:   [id number] KLOR-CON 10 10 MEQ CR-TABS (POTASSIUM CHLORIDE) 1 once daily  #90 x 3   Entered by:   Lamar Sprinkles, CMA   Authorized by:   Tresa Garter MD   Signed by:   Lamar Sprinkles, CMA on 05/22/2009   Method used:   [facility] to        [facility] (retail[facility]       [address]       [place], [place]  [address]       Ph: [phone]       Fax: [phone]   RxID:   [id number]

## 2010-03-01 NOTE — Medication Information (Signed)
Summary: Albuterol/[facility]  Albuterol/[facility]   Imported By: Sherian Rein 12/11/2009 09:27:38  _____________________________________________________________________  External Attachment:    Type:   Image     Comment:   External Document

## 2010-03-01 NOTE — Miscellaneous (Signed)
Summary: Injection record  Injection record   Imported By: Lester Altona 02/09/2010 11:31:25  _____________________________________________________________________  External Attachment:    Type:   Image     Comment:   External Document

## 2010-03-01 NOTE — Miscellaneous (Signed)
Summary: Treatment orders/[facility]  Treatment orders/[facility]   Imported By: Sherian Rein 06/13/2009 14:03:29  _____________________________________________________________________  External Attachment:    Type:   Image     Comment:   External Document

## 2010-03-01 NOTE — Miscellaneous (Signed)
Summary: Injection Record/[facility]  Injection Record/[facility]   Imported By: Sherian Rein 06/01/2009 13:51:17  _____________________________________________________________________  External Attachment:    Type:   Image     Comment:   External Document

## 2010-03-01 NOTE — Progress Notes (Signed)
Summary: nos appt  Phone Note Call from Patient   Caller: [email or web address]  Call For: young Summary of Call: ATC pt to rsc nos from 11/1, both phones disconnected. Initial call taken by: Darletta Moll,  November 29, 2009 10:07 AM
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
## 2010-03-01 NOTE — Miscellaneous (Signed)
Summary: Physician Interim Order/[facility]  Physician Interim Order/[facility]   Imported By: Sherian Rein 06/30/2009 10:36:20  _____________________________________________________________________  External Attachment:    Type:   Image     Comment:   External Document

## 2010-03-01 NOTE — Miscellaneous (Signed)
Summary: Face to Face Encounter/[facility]  Face to Face Encounter/[facility]   Imported By: Sherian Rein 05/30/2009 08:41:52  _____________________________________________________________________  External Attachment:    Type:   Image     Comment:   External Document

## 2010-03-01 NOTE — Progress Notes (Signed)
Summary: calcium  Phone Note Call from Patient Call back at Home Phone [phone]   Summary of Call: Patient is requesting a call regarding rx for calcium.  Initial call taken by: Lamar Sprinkles, CMA,  May 25, 2009 3:17 PM  Follow-up for Phone Call        Spoke with patient who stated that her medication list says calcium 600mg /1500mg  however, she has checked severeal pharmacies and has not been able to find. Please advise Thanks.Marland KitchenMarland KitchenMarland KitchenAlvy Beal Archie CMA  May 26, 2009 11:03 AM   Additional Follow-up for Phone Call Additional follow up Details #1::        Sorry for confusion! Pls just take Vit D and TUMs  2 a day Additional Follow-up by: Tresa Garter MD,  May 26, 2009 1:09 PM    Additional Follow-up for Phone Call Additional follow up Details #2::    Pt informed  Follow-up by: Lamar Sprinkles, CMA,  May 26, 2009 1:54 PM  New/Updated Medications: TUMS 500 MG CHEW (CALCIUM CARBONATE ANTACID) 1 by mouth bid Prescriptions: KLOR-CON 10 10 MEQ CR-TABS (POTASSIUM CHLORIDE) 1 once daily  #90 x 3   Entered by:   Lamar Sprinkles, CMA   Authorized by:   Tresa Garter MD   Signed by:   Lamar Sprinkles, CMA on 05/26/2009   Method used:   [facility] to        [facility]       [address]       [place], [place]  [address]       Ph: [phone]       Fax: [phone]   RxID:   [id number]

## 2010-03-01 NOTE — Medication Information (Signed)
Summary: [facility]ol/[facility]  [facility]   Imported By: Sherian Rein 12/05/2009 08:58:39  _____________________________________________________________________  External Attachment:    Type:   Image     Comment:   External Document
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
## 2010-03-01 NOTE — Progress Notes (Signed)
Summary: waiting on rx  Phone Note Call from Patient   Caller: Patient Call For: young Summary of Call: [facility] for nasonex yet. says nurse should just "call in with a verbal". pls call pt to confirm [phone] Initial call taken by: Tivis Ringer, CNA,  September 08, 2009 12:32 PM  Follow-up for Phone Call        [facility].  Spoke with pt and notifed this done. Follow-up by: Vernie Murders,  September 08, 2009 2:30 PM    Prescriptions: NASONEX 50 MCG/ACT SUSP (MOMETASONE FUROATE) 2 sprays each nostril once daily as needed  #1 x 4   Entered by:   Vernie Murders   Authorized by:   Waymon Budge MD   Signed by:   Vernie Murders on 09/08/2009   Method used:   Telephoned to ...       [facility] ([facility]ail[facility]       [address]       [place], [place]  [address]       Ph: [phone]       Fax: [phone]   RxID:   [id number]

## 2010-03-01 NOTE — Progress Notes (Signed)
   Phone Note Call from Patient   Caller: Nursing Home Details for Reason: Ankle Swelling Summary of Call: Pt s/p ORIF Right ankle 3/10 on coumadin therapy with INR 3.1 on 3/17. Nurse called pt has more swelling in right leg s/p therapy, denies any temp, increase in pain, prurlent discharge from wound staples d/c/i. Elevating leg on  pillows. As no change in pain, no overt signs of infection will monitor with elevation. Pt on anti-coagulation for DVT and therapeutic per notes. No change to orders.  Initial call taken by: Milinda Antis MD,  April 15, 2009 4:54 PM

## 2010-03-01 NOTE — Letter (Signed)
Summary: [facility]   Imported By: Lester Roscoe 06/15/2009 10:18:02  _____________________________________________________________________  External Attachment:    Type:   Image     Comment:   External Document

## 2010-03-01 NOTE — Miscellaneous (Signed)
Summary: Plan/[facility]  Plan/[facility]   Imported By: Lester Gordonville 05/31/2009 08:40:55  _____________________________________________________________________  External Attachment:    Type:   Image     Comment:   External Document
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
## 2010-03-01 NOTE — Miscellaneous (Signed)
Summary: Injection Record / [facility]    Injection Record / [facility]    Imported By: Lennie Odor 09/29/2009 10:09:27  _____________________________________________________________________  External Attachment:    Type:   Image     Comment:   External Document

## 2010-03-01 NOTE — Assessment & Plan Note (Signed)
Summary: flu shot//kcw   Nurse Visit   Allergies: No Known Drug Allergies  Immunizations Administered:  Influenza Vaccine # 1:    Vaccine Type: Fluvax MCR    Site: right deltoid    Mfr: GlaxoSmithKline    Dose: 0.5 ml    Given by: Clarise Cruz    Exp. Date: 07/28/2010    Lot #: [id number]    VIS given: 08/22/09 version given January 08, 2010.  Flu Vaccine Consent Questions:    Do you have a history of severe allergic reactions to this vaccine? no    Any prior history of allergic reactions to egg and/or gelatin? no    Do you have a sensitivity to the preservative Thimersol? no    Do you have a past history of Guillan-Barre Syndrome? no    Do you currently have an acute febrile illness? no    Have you ever had a severe reaction to latex? no    Vaccine information given and explained to patient? yes    Are you currently pregnant? no  Orders Added: 1)  Influenza Vaccine MCR [00025]

## 2010-03-01 NOTE — Progress Notes (Signed)
Summary: medication issue  Phone Note Call from Patient Call back at [phone]   Caller: Patient Call For: young Summary of Call: Pt states that clarinex 5mg  is not covered, needs substitute pls advise. Initial call taken by: Darletta Moll,  September 05, 2009 4:35 PM  Follow-up for Phone Call        Called and spoke to pt who advised pharmacy states Clarinex is not covered. I called and spoke to pharmacist who advised the message they are getting is to try Nasonex, generic Flonase, or generic Allegra 30mg  or 60mg . Please advise. Thanks. Zackery Barefoot CMA  September 05, 2009 4:50 PM   I called and spoke with pt advising her of above, pt states she currently uses Nasonex 2 sprays each nostril as needed with relief when she needs it. Pt states she is okay with whatever option CY orders as long as it is covered by her insurance. Please advise. Thanks. Zackery Barefoot CMA  September 05, 2009 4:58 PM   NKDA  Additional Follow-up for Phone Call Additional follow up Details #1::        She is already on nasonex- should use it every day. During worse times try using it twice daily. Her insurance won't pay for an antihistamine, so she can use an otc generic like fexofenadine or loratadine. Additional Follow-up by: Waymon Budge MD,  September 05, 2009 8:47 PM    Additional Follow-up for Phone Call Additional follow up Details #2::    Called, spoke with pt.  Pt advised of above recs per CY.  She verbalized understanding.  Gweneth Dimitri RN  September 06, 2009 8:48 AM   New/Updated Medications: NASONEX 50 MCG/ACT SUSP (MOMETASONE FUROATE) 2 sprays each nostril once daily as needed

## 2010-03-01 NOTE — Progress Notes (Signed)
Summary: prescription-await for pt tcb  Phone Note Call from Patient Call back at [phone]   Caller: Patient Call For: young Summary of Call: Needs a substitute for allegra that medicare will pay for, re: pharmacy doesn't carry it.//[facility], [phone] Initial call taken by: Darletta Moll,  September 01, 2009 2:04 PM  Follow-up for Phone Call        [email or web address] Randell Loop [facility]  September 01, 2009 2:07 PM   Called, spoke with pt.  Pt states since Allegra is now OTC she cannot afford it.  Would like a Rx for something that medicare will pay for.  Will forward message to CY-pls advise.  Thanks! Follow-up by: Gweneth Dimitri RN,  September 04, 2009 12:07 PM  Additional Follow-up for Phone Call Additional follow up Details #1::        Please have pt inform us of what her insurance will cover as each patients insurance is different and then we can adivse. Thanks.Reynaldo Minium CMA  September 04, 2009 12:29 PM     Additional Follow-up for Phone Call Additional follow up Details #2::    Spoke with pt and advised that she needs to get her drug formulary list so that we will know what med to prescribe for her.  She states that she will request this info and call us back.  Will await a call back Vernie Murders  September 04, 2009 1:48 PM  Spoke with pt again- xyzal and clarinex are covered.  Which med would you like to call in for pt? Pls advise thanks! NKDA Follow-up by: Vernie Murders,  September 04, 2009 2:58 PM  Additional Follow-up for Phone Call Additional follow up Details #3:: Details for Additional Follow-up Action Taken: per CY---call in clarinex 5mg   #30  1 dialy as needed   refill as needed.  this has been sent to the pts pharmacy and pt is aware Randell Loop CMA  September 04, 2009 5:04 PM   New/Updated Medications: CLARINEX 5 MG TABS (DESLORATADINE) take one tablet by mouth daily as needed Prescriptions: CLARINEX 5 MG TABS (DESLORATADINE) take one tablet by mouth daily as needed  #30 x  11   Entered by:   Randell Loop CMA   Authorized by:   Waymon Budge MD   Signed by:   Randell Loop CMA on 09/04/2009   Method used:   Electronically to        [facility] (retail)       [address]       [place], [place]  [address]       Ph: [phone]       Fax: [phone]   RxID:   [id number]

## 2010-03-01 NOTE — Progress Notes (Signed)
  Phone Note From Pharmacy   Reason for Call: Refill Medication Summary of Call: Pt requesting medication refill but has not been seen by CY or primary.  Summary of Call: [facility] [place] [facility] for [facility].  Pt has not been see by Dr. Maple Hudson and has previously been instructed to set up appt for med refills.  Initial call taken by: Canary Brim NP-C,  May 19, 2009 3:56 PM

## 2010-03-01 NOTE — Assessment & Plan Note (Signed)
Summary: NH pt discharge   Primary Care Provider:  Plotnikov   History of Present Illness: 60 yo female s/p ORIF by Dr. August Saucer 3/10 for R trimalleolar ankle fracture. Discharged to rehab 3/16.   Pt states that she is ready for d/c back to home today as she has a ride coming from [place] and doesn't want to go home in the rain tomorrow. Pt has been particpating well with PT/OT since admit to rehab. Apparently insurance monies are running short and thus the pt requests discharge to home with [facility] PT/OT/nursing.    **pt will need anticoagulation monitored upon d/c. Last INR was 1.8 4/1. Next INR check scheduled for 05/02/09. Has been on coumadin 5 mg daily  most recent INRs and coumadin doses:  04/13/09: INR = 3.1 at coumadin 5 mg daily 04/17/09: INR = 2.2 at coumadin 5 mg MWF, 2.5 mg other days 04/21/09: INR = 1.5 at coumadin 5 mg MWF, 2.5 mg other days 04/24/09: INR = 1.4 at coumadin 5 mg MWF, 2.5 mg other days 04/28/09: INR = 1.8 at coumadin 5 mg daily (at this dose currently with recheck scheduled 05/02/09)   Current Medications (verified): 1)  Advair Diskus 250-50 Mcg/dose  Misc (Fluticasone-Salmeterol) .... Inhale 1 Puff Two Times A Day 2)  Proair Hfa 108 (90 Base) Mcg/act  Aers (Albuterol Sulfate) .Marland Kitchen.. 1-2 Puffs Every 4-6 Hours As Needed 3)  Albuterol Sulfate (2.5 Mg/68ml) 0.083%  Nebu (Albuterol Sulfate) .... As Needed 4)  Allergy Vaccine 1:10 Gh 5)  Cpap   9 .... At Bedtime 6)  Diovan Hct 160-25 Mg Tabs (Valsartan-Hydrochlorothiazide) .... One By Mouth Daily For Blood Pressure 7)  Omeprazole 20 Mg Cpdr (Omeprazole) .... One By Mouth Daily 8)  Percocet 5-325 Mg Tabs (Oxycodone-Acetaminophen) .Marland Kitchen.. 1-2 Q 4 Hours As Needed Pain 9)  Fexofenadine Hcl 180 Mg Tabs (Fexofenadine Hcl) .... One By Mouth Daily 10)  Coumadin 5 Mg Tabs (Warfarin Sodium) .... As Directed 11)  Vitamin D 1000 Unit Tabs (Cholecalciferol) .... One By Mouth Daily 12)  Calcium 600 1500 Mg Tabs (Calcium Carbonate) .... One By  Mouth Two Times A Day  Allergies (verified): No Known Drug Allergies  Physical Exam  General:  no acute distress, participating with PT. Verbalizes readiness for d/c to home.    Impression & Recommendations:  Problem # 1:  FRACTURE, ANKLE, RIGHT (ICD-824.8) Assessment Unchanged per Dr. August Saucer. Has follow-up at his office 05/24/09. Continue HH PT/OT/nursing. Script given for wheelchair, 3:1 chair, rolling walker.  d/c to home today.   [facility] for patient's medicines [facility] [place]. Pt should be on medicines corresponding to her med list prior to admission to hospital, with the addition of coumadin.   Problem # 2:  ENCOUNTER FOR LONG-TERM USE OF ANTICOAGULANTS (ICD-V58.61) Assessment: Unchanged Dr. August Saucer recommends anticoagulation for 4 weeks beyond his visit with pt 3/23. Home health to draw INR 05/02/09 and contact his office with results. It will be up to Dr. Diamantina Providence office to arrange for further INR monitoring. He is aware of this plan and agreeable. Dr. Posey Rea is pt's PCP but has not seen the patient since 06/2007. I have asked the patient to make an appointment with Dr. Posey Rea in the next 1-2 weeks. She states that she will.   most recent INRs and coumadin doses:  04/13/09: INR = 3.1 at coumadin 5 mg daily 04/17/09: INR = 2.2 at coumadin 5 mg MWF, 2.5 mg other days 04/21/09: INR = 1.5 at coumadin  5 mg MWF, 2.5 mg other days 04/24/09: INR = 1.4 at coumadin 5 mg MWF, 2.5 mg other days 04/28/09: INR = 1.8 at coumadin 5 mg daily (at this dose currently with recheck scheduled 05/02/09)  Problem # 3:  HYPERTENSION (ICD-401.9) Assessment: Unchanged continue diovan/hct. On benicar hct after discharge from hospital.   Her updated medication list for this problem includes:    Diovan Hct 160-25 Mg Tabs (Valsartan-hydrochlorothiazide) ..... One by mouth daily for blood pressure  Problem # 4:  ALLERGIC RHINITIS (ICD-477.9) Assessment: Unchanged continue fexofenadine (on claritin  after discharge from hospital) Her updated medication list for this problem includes:    Fexofenadine Hcl 180 Mg Tabs (Fexofenadine hcl) ..... One by mouth daily  Problem # 5:  GERD (ICD-530.81) Assessment: Unchanged prilosec (on protonix after discharge from hospital) Her updated medication list for this problem includes:    Omeprazole 20 Mg Cpdr (Omeprazole) ..... One by mouth daily  Problem # 6:  ASTHMA (ICD-493.90) Assessment: Unchanged continue home regimen. Also gets allergy shots once a week.  Her updated medication list for this problem includes:    Advair Diskus 250-50 Mcg/dose Misc (Fluticasone-salmeterol) ..... Inhale 1 puff two times a day    Proair Hfa 108 (90 Base) Mcg/act Aers (Albuterol sulfate) .Marland Kitchen... 1-2 puffs every 4-6 hours as needed    Albuterol Sulfate (2.5 Mg/60ml) 0.083% Nebu (Albuterol sulfate) .Marland Kitchen... As needed  Complete Medication List: 1)  Advair Diskus 250-50 Mcg/dose Misc (Fluticasone-salmeterol) .... Inhale 1 puff two times a day 2)  Proair Hfa 108 (90 Base) Mcg/act Aers (Albuterol sulfate) .Marland Kitchen.. 1-2 puffs every 4-6 hours as needed 3)  Albuterol Sulfate (2.5 Mg/47ml) 0.083% Nebu (Albuterol sulfate) .... As needed 4)  Allergy Vaccine 1:10 Gh  5)  Cpap 9  .... At bedtime 6)  Diovan Hct 160-25 Mg Tabs (Valsartan-hydrochlorothiazide) .... One by mouth daily for blood pressure 7)  Omeprazole 20 Mg Cpdr (Omeprazole) .... One by mouth daily 8)  Percocet 5-325 Mg Tabs (Oxycodone-acetaminophen) .Marland Kitchen.. 1-2 q 4 hours as needed pain 9)  Fexofenadine Hcl 180 Mg Tabs (Fexofenadine hcl) .... One by mouth daily 10)  Coumadin 5 Mg Tabs (Warfarin sodium) .... As directed 11)  Vitamin D 1000 Unit Tabs (Cholecalciferol) .... One by mouth daily 12)  Calcium 600 1500 Mg Tabs (Calcium carbonate) .... One by mouth two times a day Prescriptions: ADVAIR DISKUS 250-50 MCG/DOSE  MISC (FLUTICASONE-SALMETEROL) Inhale 1 puff two times a day  #1 x prn   Entered and Authorized by:   Myrtie Soman  MD   Signed by:   Myrtie Soman  MD on 05/01/2009   Method used:   Electronically to        [facility] ([facility]ail)       [address]       [place], [place]  [address]       Ph: [phone]       Fax: [phone]   RxID:   [id number] COUMADIN 5 MG TABS (WARFARIN SODIUM) as directed  #30 x 0   Entered and Authorized by:   Myrtie Soman  MD   Signed by:   Myrtie Soman  MD on 05/01/2009   Method used:   Electronically to        [facility] (retail)       [address]       [place], [place]  [address]       Ph: [phone]       Fax:  [phone]   RxID:   [id number] FEXOFENADINE HCL 180 MG TABS (FEXOFENADINE HCL) one by mouth daily  #30 x 0   Entered and Authorized by:   Myrtie Soman  MD   Signed by:   Myrtie Soman  MD on 05/01/2009   Method used:   Electronically to        [facility]* (retail)       [address]       [place], [place]  [address]       Ph: [phone]       Fax: [phone]   RxID:   [id number] DIOVAN HCT 160-25 MG TABS (VALSARTAN-HYDROCHLOROTHIAZIDE) one by mouth daily for blood pressure  #30 x 0   Entered and Authorized by:   Myrtie Soman  MD   Signed by:   Myrtie Soman  MD on 05/01/2009   Method used:   Electronically to        [facility] (retail)       [address]       [place], [place]  [address]       Ph: [phone]       Fax: [phone]   RxID:   [id number] OMEPRAZOLE 20 MG CPDR (OMEPRAZOLE) one by mouth daily  #30 x 0   Entered and Authorized by:   Myrtie Soman  MD   Signed by:   Myrtie Soman  MD on 05/01/2009   Method used:   Electronically to        [facility] (retail)       [address]       [place], [place]  [address]       Ph: [phone]       Fax: [phone]   RxID:   [id number]   Appended Document: NH pt discharge flag sent to admin: please fax a copy of today's note to Dr. August Saucer [facility]), Dr. Posey Rea ([place] primary [facility]), and Dr. Fannie Knee (pulmonology)  before the end of the day today.   Appended Document: NH pt discharge     Clinical Lists Changes  Problems: Removed problem of PERSON LIVING IN RESIDENTIAL INSTITUTION (ICD-V60.6) Observations: Removed observation of PT ROOM#: 123  (04/14/2009 15:53)

## 2010-03-01 NOTE — Miscellaneous (Signed)
Summary: Injection Record/[facility]  Injection Record/[facility]   Imported By: Sherian Rein 06/20/2009 13:41:46  _____________________________________________________________________  External Attachment:    Type:   Image     Comment:   External Document

## 2010-03-01 NOTE — Progress Notes (Signed)
Summary: D/C From [facility] Facility  Phone Note From Other Clinic   Summary of Call: Dr Rexene Alberts called: Pt is being d/c'd from rehab facility today. Ortho started pt on coumadin but will not be following this upon discharge. Home health has been set up to check INR tomorrow. They need someone to follow INR. Gave verbal ok that Dr Posey Rea is PCP and will follow. If this is not ok we will need to page Dr Rexene Alberts at [phone]  Pt's last office visit was 07/2007. I advised Dr Rexene Alberts that pt would need f/u w/PCP soon. He called me back later and said since ortho, Dr August Saucer, had seen pt recently they would manage INR initially. If ortho wants to PCP to manage they will contact our office.   Would you like [place] to set pt up for f/u office visit soon?  Pt's contact, Lonia Skinner - [phone] OR son Casimiro Needle - [phone] Initial call taken by: Lamar Sprinkles, CMA,  May 01, 2009 2:26 PM  Follow-up for Phone Call        OK for Dr August Saucer to manage INR. I can see her when she is more mobile. I'll be on   stand by... Follow-up by: Tresa Garter MD,  May 01, 2009 5:10 PM
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
## 2010-03-16 ENCOUNTER — Ambulatory Visit (INDEPENDENT_AMBULATORY_CARE_PROVIDER_SITE_OTHER): Payer: Medicare Other

## 2010-03-16 DIAGNOSIS — J301 Allergic rhinitis due to pollen: Secondary | ICD-10-CM

## 2010-03-19 ENCOUNTER — Ambulatory Visit (INDEPENDENT_AMBULATORY_CARE_PROVIDER_SITE_OTHER): Payer: Medicare Other

## 2010-03-19 DIAGNOSIS — J301 Allergic rhinitis due to pollen: Secondary | ICD-10-CM

## 2010-03-21 ENCOUNTER — Telehealth (INDEPENDENT_AMBULATORY_CARE_PROVIDER_SITE_OTHER): Payer: Self-pay | Admitting: *Deleted

## 2010-03-23 ENCOUNTER — Telehealth (INDEPENDENT_AMBULATORY_CARE_PROVIDER_SITE_OTHER): Payer: Self-pay | Admitting: *Deleted

## 2010-03-27 ENCOUNTER — Other Ambulatory Visit: Payer: Self-pay | Admitting: Internal Medicine

## 2010-03-27 ENCOUNTER — Ambulatory Visit (INDEPENDENT_AMBULATORY_CARE_PROVIDER_SITE_OTHER): Payer: Medicare Other

## 2010-03-27 ENCOUNTER — Ambulatory Visit (INDEPENDENT_AMBULATORY_CARE_PROVIDER_SITE_OTHER): Payer: Medicare Other | Admitting: Internal Medicine

## 2010-03-27 ENCOUNTER — Encounter: Payer: Self-pay | Admitting: Internal Medicine

## 2010-03-27 DIAGNOSIS — Z0000 Encounter for general adult medical examination without abnormal findings: Secondary | ICD-10-CM

## 2010-03-27 DIAGNOSIS — S82899A Other fracture of unspecified lower leg, initial encounter for closed fracture: Secondary | ICD-10-CM

## 2010-03-27 DIAGNOSIS — J301 Allergic rhinitis due to pollen: Secondary | ICD-10-CM

## 2010-03-27 DIAGNOSIS — J45909 Unspecified asthma, uncomplicated: Secondary | ICD-10-CM

## 2010-03-27 DIAGNOSIS — G4733 Obstructive sleep apnea (adult) (pediatric): Secondary | ICD-10-CM

## 2010-03-27 DIAGNOSIS — I10 Essential (primary) hypertension: Secondary | ICD-10-CM

## 2010-03-27 NOTE — Progress Notes (Signed)
Summary: pt needs prescriptions  Phone Note Call from Patient Call back at Home Phone [phone]   Caller: Patient Call For: young Summary of Call: Patient phoned stated that she needs new prescription for her Omeprazole, Diovan, and Klor-con he wrote out the prescriptions for all of her other meds. [facility] in [place] [phone]. Patient can ber reached [phone]  Initial call taken by: Vedia Coffer,  March 21, 2010 2:59 PM  Follow-up for Phone Call        Spoke with pt and advised that she needs to get her rxs for K, diovan and omeprazole from her PCP.  Pt verbalized understanding.  Follow-up by: Vernie Murders,  March 21, 2010 4:14 PM

## 2010-03-27 NOTE — Progress Notes (Signed)
Summary: APPT PER STACEY PER MD--  ---- Converted from flag ---- ---- 02/15/2010 8:48 AM, Lanier Prude, CMA(AAMA) wrote: Please sched pt for OV. She was last seen in 04/2009 ------------------------------  Gave pt appt--phone--:   03/27/10 @ 1115A w/Dr AVP

## 2010-03-28 ENCOUNTER — Telehealth: Payer: Self-pay | Admitting: Internal Medicine

## 2010-03-30 LAB — URINALYSIS
Hgb urine dipstick: NEGATIVE
Nitrite: NEGATIVE
Specific Gravity, Urine: 1.01 (ref 1.000–1.030)
Total Protein, Urine: NEGATIVE
Urine Glucose: NEGATIVE
Urobilinogen, UA: 0.2 (ref 0.0–1.0)

## 2010-03-30 LAB — BASIC METABOLIC PANEL
BUN: 11 mg/dL (ref 6–23)
Chloride: 103 mEq/L (ref 96–112)
GFR: 72.54 mL/min (ref >60.00)
Potassium: 3.6 mEq/L (ref 3.5–5.1)
Sodium: 141 mEq/L (ref 135–145)

## 2010-03-30 LAB — CBC WITH DIFFERENTIAL/PLATELET
Basophils Relative: 0.6 % (ref 0.0–3.0)
Eosinophils Relative: 2.5 % (ref 0.0–5.0)
HCT: 36 % (ref 36.0–46.0)
Hemoglobin: 11.7 g/dL — ABNORMAL LOW (ref 12.0–15.0)
Lymphs Abs: 1.6 10*3/uL (ref 0.7–4.0)
MCV: 72.9 fl — ABNORMAL LOW (ref 78.0–100.0)
Monocytes Absolute: 0.5 10*3/uL (ref 0.1–1.0)
Monocytes Relative: 8 % (ref 3.0–12.0)
RBC: 4.93 Mil/uL (ref 3.87–5.11)
WBC: 6.6 10*3/uL (ref 4.5–10.5)

## 2010-03-30 LAB — LIPID PANEL
Cholesterol: 160 mg/dL (ref 0–200)
LDL Cholesterol: 65 mg/dL (ref 0–99)
Total CHOL/HDL Ratio: 2
Triglycerides: 66 mg/dL (ref 0.0–149.0)

## 2010-03-30 LAB — HEPATIC FUNCTION PANEL
ALT: 23 U/L (ref 0–35)
AST: 19 U/L (ref 0–37)
Total Bilirubin: 0.5 mg/dL (ref 0.3–1.2)
Total Protein: 7.1 g/dL (ref 6.0–8.3)

## 2010-04-05 NOTE — Progress Notes (Signed)
Summary: Omeprazole  Phone Note Call from Patient   Summary of Call: Rx for omeprazole given yesterday had no refills. Should pt continue this med?  Initial call taken by: Lamar Sprinkles, CMA,  March 28, 2010 10:50 AM  Follow-up for Phone Call        6 ref Follow-up by: Tresa Garter MD,  March 28, 2010 6:06 PM  Additional Follow-up for Phone Call Additional follow up Details #1::        Pt informed.Per Pt request #[id number].[facility]. Additional Follow-up by: Burnard Leigh [facility]),  March 29, 2010 9:14 AM    Prescriptions: OMEPRAZOLE 20 MG CPDR (OMEPRAZOLE) one by mouth daily  #90 x 1   Entered by:   Burnard Leigh CMA(AAMA)   Authorized by:   Tresa Garter MD   Signed by:   Burnard Leigh [facility]) on 03/29/2010   Method used:   Electronically to        [facility] (retail)       [address]       [place], [place]  [address]       Ph: [phone]       Fax: [phone]   RxID:   [id number]
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
## 2010-04-09 ENCOUNTER — Ambulatory Visit (INDEPENDENT_AMBULATORY_CARE_PROVIDER_SITE_OTHER): Payer: Medicare Other

## 2010-04-09 ENCOUNTER — Encounter: Payer: Self-pay | Admitting: Internal Medicine

## 2010-04-09 DIAGNOSIS — J301 Allergic rhinitis due to pollen: Secondary | ICD-10-CM

## 2010-04-09 DIAGNOSIS — J302 Other seasonal allergic rhinitis: Secondary | ICD-10-CM | POA: Insufficient documentation

## 2010-04-09 DIAGNOSIS — J3089 Other allergic rhinitis: Secondary | ICD-10-CM

## 2010-04-17 NOTE — Assessment & Plan Note (Signed)
Summary: allergy/cb  Nurse Visit   Allergies: No Known Drug Allergies  Orders Added: 1)  Allergy Injection (1) [95115] 

## 2010-04-20 ENCOUNTER — Ambulatory Visit (INDEPENDENT_AMBULATORY_CARE_PROVIDER_SITE_OTHER): Payer: Medicare Other

## 2010-04-20 DIAGNOSIS — J301 Allergic rhinitis due to pollen: Secondary | ICD-10-CM

## 2010-04-23 LAB — URINALYSIS, ROUTINE W REFLEX MICROSCOPIC
Ketones, ur: NEGATIVE mg/dL
Nitrite: NEGATIVE
Specific Gravity, Urine: 1.016 (ref 1.005–1.030)
pH: 5.5 (ref 5.0–8.0)

## 2010-04-23 LAB — DIFFERENTIAL
Lymphocytes Relative: 15 % (ref 12–46)
Lymphs Abs: 1.4 10*3/uL (ref 0.7–4.0)
Monocytes Absolute: 0.8 10*3/uL (ref 0.1–1.0)
Monocytes Relative: 8 % (ref 3–12)
Neutro Abs: 7.1 10*3/uL (ref 1.7–7.7)
Neutrophils Relative %: 76 % (ref 43–77)

## 2010-04-23 LAB — CBC
Hemoglobin: 10.3 g/dL — ABNORMAL LOW (ref 12.0–15.0)
Hemoglobin: 12.2 g/dL (ref 12.0–15.0)
RBC: 4.15 MIL/uL (ref 3.87–5.11)
RBC: 5 MIL/uL (ref 3.87–5.11)
WBC: 9.4 10*3/uL (ref 4.0–10.5)

## 2010-04-23 LAB — BASIC METABOLIC PANEL
Calcium: 8.9 mg/dL (ref 8.4–10.5)
Creatinine, Ser: 0.79 mg/dL (ref 0.4–1.2)
GFR calc Af Amer: >60 mL/min (ref >60)
Sodium: 138 mEq/L (ref 135–145)

## 2010-04-23 LAB — GLUCOSE, CAPILLARY
Glucose-Capillary: 108 mg/dL — ABNORMAL HIGH (ref 70–99)
Glucose-Capillary: 115 mg/dL — ABNORMAL HIGH (ref 70–99)
Glucose-Capillary: 150 mg/dL — ABNORMAL HIGH (ref 70–99)
Glucose-Capillary: 99 mg/dL (ref 70–99)

## 2010-04-23 LAB — URINE MICROSCOPIC-ADD ON

## 2010-04-23 LAB — PROTIME-INR
INR: 0.97 (ref 0.00–1.49)
INR: 1.8 — ABNORMAL HIGH (ref 0.00–1.49)
INR: 2.08 — ABNORMAL HIGH (ref 0.00–1.49)
INR: 2.33 — ABNORMAL HIGH (ref 0.00–1.49)
Prothrombin Time: 18.1 seconds — ABNORMAL HIGH (ref 11.6–15.2)
Prothrombin Time: 20.7 seconds — ABNORMAL HIGH (ref 11.6–15.2)
Prothrombin Time: 25.4 seconds — ABNORMAL HIGH (ref 11.6–15.2)

## 2010-05-02 ENCOUNTER — Ambulatory Visit (INDEPENDENT_AMBULATORY_CARE_PROVIDER_SITE_OTHER): Payer: Medicare Other

## 2010-05-02 DIAGNOSIS — J301 Allergic rhinitis due to pollen: Secondary | ICD-10-CM

## 2010-05-11 ENCOUNTER — Ambulatory Visit (INDEPENDENT_AMBULATORY_CARE_PROVIDER_SITE_OTHER): Payer: Medicare Other

## 2010-05-11 DIAGNOSIS — J309 Allergic rhinitis, unspecified: Secondary | ICD-10-CM

## 2010-05-14 ENCOUNTER — Ambulatory Visit (INDEPENDENT_AMBULATORY_CARE_PROVIDER_SITE_OTHER): Payer: Medicare Other

## 2010-05-14 DIAGNOSIS — J309 Allergic rhinitis, unspecified: Secondary | ICD-10-CM

## 2010-05-21 ENCOUNTER — Ambulatory Visit (INDEPENDENT_AMBULATORY_CARE_PROVIDER_SITE_OTHER): Payer: Medicare Other

## 2010-05-21 DIAGNOSIS — J309 Allergic rhinitis, unspecified: Secondary | ICD-10-CM

## 2010-05-28 ENCOUNTER — Ambulatory Visit (INDEPENDENT_AMBULATORY_CARE_PROVIDER_SITE_OTHER): Payer: Medicare Other

## 2010-05-28 DIAGNOSIS — J309 Allergic rhinitis, unspecified: Secondary | ICD-10-CM

## 2010-06-06 ENCOUNTER — Ambulatory Visit (INDEPENDENT_AMBULATORY_CARE_PROVIDER_SITE_OTHER): Payer: Medicare Other

## 2010-06-06 DIAGNOSIS — J309 Allergic rhinitis, unspecified: Secondary | ICD-10-CM

## 2010-06-12 NOTE — Assessment & Plan Note (Signed)
[facility]                             PULMONARY OFFICE NOTE   [name], [name]                    MRN:          [id number]  DATE:01/02/2007                            DOB:          06/27/1950    PROBLEMS:  1. Asthmatic bronchitis.  2. Allergic rhinitis.  3. Chronic cough.  4. Obstructive sleep apnea.  5. Esophageal reflux.   HISTORY:  While I was out, she saw Dr. Craige Cotta in September with an acute  asthmatic bronchitis blamed on a viral illness and treated with a Medrol  dose pack. Since then, she says that her breathing has been good. She  notices a little soreness behind her left ear, not very specific. She  sleeps better using CPAP continuously at 9 CWP. Occasional nosebleeds  since indoor heat came on. We discussed humidity. Aciphex prevents  heartburn.   MEDICATIONS:  Her medication list is reviewed without changes noted.   OBJECTIVE:  Weight 253 pounds, blood pressure 110/76, pulse 79, room air  saturation 96%. Ear exam is normal bilaterally. I do not recognize any  tenderness in the mastoid areas and I do not find cervical adenopathy.  Her nose and throat seem clear. Minor hoarseness. No strider. Lungs are  clear. Heart sounds are regular without murmur. She remains overweight.   IMPRESSION:  1. Rhinitis aggravated by dry indoor heat and CPAP airflow with      comfort measures described.  2. Non-specific soreness behind the left ear. She is to watch for      change and report if it persists or gets worse.  3. Note that she has had flu vaccine and previously had pneumococcal      vaccine once a number of years ago. We discussed a booster.   PLAN:  1. Refill Aciphex.  2. Continue allergy vaccine.  3. Pneumococcal vaccine booster.  4. Schedule return 6 months, earlier p.r.n.     Clinton D. Maple Hudson, MD, Tonny Bollman, FACP  Electronically Signed    CDY/MedQ  DD: 01/03/2007  DT: 01/04/2007  Job #: [id number]

## 2010-06-12 NOTE — Assessment & Plan Note (Signed)
[facility]                             PULMONARY OFFICE NOTE   JULIAN, ASKIN                    MRN:          [id number]  DATE:09/02/2006                            DOB:          1950/12/05    PRIMARY CARE PHYSICIAN:  Dr. Posey Rea.   PROBLEM LIST:  1. Asthmatic bronchitis.  2. Allergic rhinitis.  3. Chronic cough.  4. Obstructive sleep apnea.   HISTORY:  For 3 days she has had left earache and eyes have been  watering.  Chest feels comfortable.  She had exactly the same complaints  when last here in April and responded nicely at that time to  amoxicillin.  We also reduced her CPAP to 9 CWP.  She says her ears were  fine through the summer until this acute event.  CPAP at 9 CWP is very  comfortable.  She does not seem to think the CPAP pressure is affecting  her ear.   MEDICATIONS:  1. Advair 250/50.  2. Aciphex 20 mg.  3. Singulair 10 mg.  4. Allegra 180 mg.  5. Nasonex.  6. Diovan HCT 160/25.  7. K-Dur 10.  8. Allergy vaccine.  9. CPAP 9 CWP.  10.She has an albuterol metered inhaler, she also has albuterol by      nebulizer.   No medication allergy.   OBJECTIVE:  Weight 250 pounds, BP 132/76, pulse 99, room air saturation  97%.  Obese, quiet chest.  Some throat clearing without erythema, visible draining, or stridor.  There is minimal clear fluid at the left tympanic membrane without  retraction or erythema.   IMPRESSION:  1. Treat as eustachian dysfunction, possibly with a serous otitis      media.  2. Obstructive sleep apnea.  3. Asthma.   PLAN:  1. We will advance her allergy vaccine to 1:10.  2. Z-Pak.  3. Nasal inhalation with Neo-Synephrine.  4. Sudafed-PE.  5. Schedule return 4 months, earlier p.r.n.     Clinton D. Maple Hudson, MD, Tonny Bollman, FACP  Electronically Signed    CDY/MedQ  DD: 09/02/2006  DT: 09/03/2006  Job #: [id number]

## 2010-06-12 NOTE — Letter (Signed)
October 20, 2008     RE:  [name], [name]  MRN:  [id number]  /  DOB:  November 14, 1950   To Whom It May Concern:   [name] is a patient in my practice.  She utilizes an  electrically powered nebulizer machine four times a day to deliver  necessary medication on a daily basis.   Please consider this in managing her account.    Sincerely,      Clinton D. Maple Hudson, MD, Tonny Bollman, FACP  Electronically Signed    CDY/MedQ  DD: 10/20/2008  DT: 10/20/2008  Job #: [id number]

## 2010-06-12 NOTE — Assessment & Plan Note (Signed)
[facility]                             PULMONARY OFFICE NOTE   [name], [name]                    MRN:          [id number]  DATE:10/16/2006                            DOB:          [birth year]    I saw [name] for evaluation of her acute bronchitis.   She carries a diagnosis of asthmatic bronchitis, allergic rhinitis,  chronic cough and obstructive sleep apnea. She normally sees Dr.  Maple Hudson  for these conditions and is on allergy shots. She says that since for  the last four days she has been having a problem with a sore throat in  addition to having a runny nose and a dry cough. She says that she has  been having wheezing and chest tightness which improves after she uses a  nebulizer treatment. She usually uses her nebulizer once a day, but she  has been using it 3-4 times a day recently. She last used it about two  hours ago. She says that when she coughs she coughs so much that she  gets to the point where she sometimes actually regurgitates. She has  been having chills as well as feeling feverish, but has not actually  checked her temperature. She is not having any chest pains or  palpitations. She denies any dizziness or ringing in her ears. She has  not had any problems swallowing. She has not had any diarrhea, abdominal  pain, or leg swelling.   MEDICATIONS:  List was reviewed.   PHYSICAL EXAMINATION:  She is 253 pounds, temperature 97.8, blood  pressure 110/80, heart rate 68, oxygen saturation is 97% on room air.  GENERAL: He is awake, alert and oriented. Appears to be in good spirits  and does not appear to be in any kind of distress.  HEENT:  There is no sinus tenderness. There is a clear nasal discharge.  There is mild erythema of the posterior pharynx. There is no  lymphadenopathy. No thyromegaly.  HEART: Was S1, S2.  CHEST: There was no wheezing or rales.  ABDOMEN: Obese, soft and nontender.  EXTREMITIES: There was no  edema.   IMPRESSION:  She likely has a viral upper respiratory tract infection  with an acute asthmatic bronchitis. I do not think that she will need  antibiotics at the present time. I will give her a Medrol dose pack and  she is to use this as directed. I have also advised her to try using  nasal irrigation as well as continue on her sinus regimen. She is also  to use saline throat gargles as well as continuing on her Advair,  Singulair, Allegra and nebulized treatments as needed. I did not  appreciate any evidence for pneumonia on her clinical examination and  therefore I do not think she needs a chest x-ray at the present time. I  have advised her however that if her symptoms  were to worsen in any way she should call us for further evaluation,  otherwise, she is due to followup with Dr.  Maple Hudson in December.     Coralyn Helling, MD  Electronically Signed  VS/MedQ  DD: 10/16/2006  DT: 10/16/2006  Job #: [id number]   cc:   Georgina Quint. Plotnikov, MD  Clinton D. Maple Hudson, MD, FCCP, FACP

## 2010-06-15 NOTE — Procedures (Signed)
NAMECATALENA, [name]             ACCOUNT NO.:  [id number]   MEDICAL RECORD NO.:  [id number]          PATIENT TYPE:  OUT   LOCATION:  SLEEP CENTER                 FACILITY:  [facility]   PHYSICIAN:  Clinton D. Maple Hudson, MD, FCCP, FACPDATE OF BIRTH:  04/17/1949   DATE OF STUDY:  12/12/2005                              NOCTURNAL POLYSOMNOGRAM   INDICATIONS FOR STUDY:  Hypersomnia with sleep apnea. Epworth sleepiness  score 3/24, BMI 45, weight 250 pounds. Home medication are listed and  reviewed.   SLEEP ARCHITECTURE:  Short total sleep time 273 minutes with sleep  efficiency 63%. Stage 1 was 4%, stage 2 was 60%, stage 3 and 4 were 17%, REM  19% of total sleep time. Sleep latency 91 minutes, REM latency 86 minutes,  awake after sleep onset 68 minutes, arousable index 3.5. No bedtime  medication was taken.   RESPIRATORY DATA:  Apnea hypopnea index (AHI, RDI) 38.4 obstructive events  per hour, indicating moderate obstructive sleep apnea/hypopnea syndrome.  There were 69 obstructive apnea's and 106 hypopnea's. All sleep and  therefore, all events were recorded while supine. REM AHI 80 per hour. The  technician indicated insufficient early events to permit CPAP titration by  split protocol on this study night and most events appear to have developed  after 3:00 a.m.   OXYGEN DATA:  Mild snoring and mouth breathing with an oxygen desaturation  to a nadir of 66%. Mean oxygen saturation through the study was 92% on room  air.   CARDIAC DATA:  Normal sinus rhythm.   MOVEMENT/PARASOMNIA:  No limb jerks were recorded. Bathroom x1.   IMPRESSION/RECOMMENDATIONS:  1. Moderate obstructive sleep apnea/hypopnea syndrome, AHI 38.4 per hour,      with all events recorded sleeping supine. Mild snoring with oxygen      desaturation to a nadir of 66%. Mouth breathing was noted, suggesting      nasal congestion.  2. Consider return for CPAP titration or evaluate for alternative      therapies as  appropriate.      Clinton D. Maple Hudson, MD, [facility], FACP  Diplomate, Biomedical engineer of Sleep Medicine  Electronically Signed     CDY/MEDQ  D:  12/15/2005 13:53:11  T:  12/15/2005 60:45:40  Job:  [id number]

## 2010-06-15 NOTE — Assessment & Plan Note (Signed)
[facility]                             PULMONARY OFFICE NOTE   [name], [name]                    MRN:          [id number]  DATE:02/03/2006                            DOB:          [birth year]    PRIMARY CARE PHYSICIAN:  Dr. Posey Rea.   PROBLEM LIST:  1. Asthmatic bronchitis.  2. Allergic rhinitis.  3. Chronic cough.  4. Obstructive sleep apnea.   HISTORY OF PRESENT ILLNESS:  She says she feels well today. No recent  flares of her asthma. Breathing has been good. AcipHex does help  heartburn, and she is using it regularly. She can wear her CPAP only two  or three hours a night. Part of the problem is mask leak. She is still  on autotitration and has not turned in her chip. Notes that by the end  of the afternoon and before dictation her download had come in titrating  to 10 CWP.   MEDICATIONS:  1. Advair 500/50.  2. AcipHex 20 mg.  3. Singulair 10 mg.  4. Allegra 180 mg.  5. Nasonex.  6. Diovan HCT 160/25.  7. K-Dur 20 mg.  8. Allergy vaccine continues at 1 to 50 here.  9. Albuterol inhaler, nebulized albuterol.   ALLERGIES:  No medication allergy.   OBJECTIVE:  VITAL SIGNS:  Weight 260 pounds, BP 116/78, pulse regular  86, room air saturation 95%.  GENERAL:  She is obese.  LUNGS:  Lung fields are very clear.  HEART:  Heart sounds regular and normal.  HEENT:  Nasal airway is clear. There are no pressure marks on her face  from the mask.   IMPRESSION:  1. Obstructive sleep apnea needing better control. Weight loss is      important in this as well.  2. Asthmatic bronchitis. Doing well today.  3. Rhinitis.  4. Gastroesophageal reflux disease.   PLAN:  1. We refilled her meds reducing Advair to 250/50.  2. Change CPAP through [facility] to fixed pressure at 10 CWP.  3. Schedule return in three months.     Clinton D. Maple Hudson, MD, Tonny Bollman, FACP  Electronically Signed    CDY/MedQ  DD: 02/03/2006  DT: 02/04/2006  Job  #: [id number]

## 2010-06-15 NOTE — Op Note (Signed)
NAMEARTRICE, [name]                       ACCOUNT NO.:  [id number]   MEDICAL RECORD NO.:  [id number]                    PATIENT TYPE:   LOCATION:                                       FACILITY:   PHYSICIAN:  Nicki Reaper, M.D.                 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   OPERATION:  Arthroscopy, debridement, capsular shrinkage right wrist.   SURGEON:  Nicki Reaper, M.D.   ANESTHESIA:  General.   DATE OF OPERATION:  08/26/2001.   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  She had suffered an injury and now has  mid carpal instability.   DESCRIPTION OF PROCEDURE:  The patient is brought to the operating room  where a general anesthetic was carried out without difficulty.  She was  prepped and draped using Betadine scrubbing solution with the right are free  in the supine position.  She was placed in the arthroscopy tower and the  joint inflated to a 3-4 portal.  The transverse incision was made through  skin only, deepened with a hemostat.  A blunt trocar was used at the joint.  The joint was inspected.  Volar radial wrist ligaments were intact.  The  scapholunate ligament was intact.  Cartilage was intact in the proximal  realm.  The lunotriquetral coalition was easily visualized with no joint  space.  A significant fraying of the volar ulnar ligaments were present.  TFCC was intact.  An irrigation catheter was placed in 6U and 4-5 portal  opened after localization with 22 gauge needle. A probe was introduced.  Palpation of the PFCC was performed.  This showed a good  trampoline affect.  The mid carpal joint was then inspected.  The lunotriquetral fusion was  complete.  The injury to the volar ligaments was easily able to be  visualized after rotation of the proximal row complex.  Mid radial and ulnar  portals were opened.  An Arthrowand was then inserted in the proximal carpal  row and a shrinkage of the ulnar ligaments was performed in the proximal  radiocarpal  joint.  A shrinkage was then performed in the mid carpal joint.  X-rays taken revealed that the mid carpal instability had been corrected  with good alignment, scaphoid lunate and capitate.  It was decided not to  pin the joint in position.  The instruments were removed.  Any debris was  removed from the joint.  The portals closed with interrupted 5-0 nylon  sutures, Sterile  compressive dressing, dorsal palmar splint applied.  The patient tolerated  the procedure well and was taken to the recovery room for observation in  satisfactory condition.  She is discharged home and return to the hand  surgeon in [place] in one week on Vicodin and Keflex.                                                Nicki Reaper, M.D.    GRK/MEDQ  D:  08/26/2001  T:  08/31/2001  Job:  [id number]   cc:   Henry A. Cleotis Nipper, M.D.

## 2010-06-15 NOTE — Assessment & Plan Note (Signed)
[facility]                               PULMONARY OFFICE NOTE   EILA, RUNYAN                    MRN:          [id number]  DATE:11/05/2005                            DOB:          Dec 06, 1950    PRIMARY CARE PHYSICIAN:  Dr. Posey Rea.   PROBLEM LIST:  1. Asthmatic bronchitis.  2. Allergic rhinitis.  3. Chronic cough.   HISTORY OF PRESENT ILLNESS:  She says she has done quite well since she was  last here in June. She wheezes some days early in the morning, and she  admits heartburn is not well-controlled and occasionally wakes her despite  using over-the-counter Pepcid. She is aware that she snores loudly based on  comments repeatedly from her husband and son in the home, and she admits to  being sleepy during the daytime. She is less sure about witnessed apneas.  Allergy vaccine continues at 1 to 50 which she gets here. That dose is held.   MEDICATIONS:  1. Advair 500/50.  2. Singulair 10 mg.  3. Allegra 180 mg.  4. Nasonex.  5. Allergy vaccine.  6. Diovan HCT 160/25.  7. Potassium 20 mEq.  8. Home nebulizer with albuterol.  9. Occasional Endal HD.   ALLERGIES:  No medication allergy.   OBJECTIVE:  VITAL SIGNS:  Weight 265 pounds, BP 116/74, pulse regular 70,  room air saturation 97%.  GENERAL:  She is quite obese. ________spacing is no better than 3/4 without  stridor.  HEENT:  There is no significant nasal congestion today or postnasal  drainage.  NECK:  Neck veins are not distended. I do not feel thyroid.  LUNGS:  Lung fields are quiet but shallow consistent with her body habitus.  HEART:  Unlabored heart sounds are regular without murmur or gallop.  EXTREMITIES:  There is no peripheral edema.   IMPRESSION:  1. Asthma only fairly well-controlled.  2. Allergic rhinitis.  3. Esophageal reflux inadequately controlled on over-the-counter      medications.  4. Probable obstructive sleep apnea. She has the right symptoms  and body      habitus for it, and she is interested in having a sleep study done.   PLAN:  1. We discussed sleep hygiene, weight loss, and responsibility to drive      safely.  2. We are scheduling a nocturnal polysomnogram with split protocol with      the [facility].  3. AcipHex 20 mg daily with reflux precautions.  4. Schedule return after her sleep study.       Clinton D. Maple Hudson, MD, FCCP, FACP      CDY/MedQ  DD:  11/05/2005  DT:  11/07/2005  Job #:  [id number]

## 2010-06-15 NOTE — Assessment & Plan Note (Signed)
[facility]                             PULMONARY OFFICE NOTE   [name], [name]                    MRN:          [id number]  DATE:05/05/2006                            DOB:          Jan 26, 1951    PULMONARY FOLLOW-UP:  Primary physician:  Georgina Quint. Plotnikov, MD   PROBLEM LIST:  1. Asthmatic bronchitis.  2. Allergic rhinitis.  3. Chronic cough.  4. Obstructive sleep apnea.   HISTORY:  She has been doing quite well and has been able to use CPAP at  10 CWP just about every night.  She continues allergy vaccine here with  dose held at 1:50.  She complains of pain in her left ear since the  weekend and maybe very minimal vertigo or lightheadedness but otherwise  no headache.  Hearing is okay.  She points to the mastoid area behind  the left ear.  No chest complaints.  CPAP dries her mouth, though she  admits she feels less daytime tiredness.   MEDICATIONS:  1. Advair 250/50 mcg.  2. AcipHex 20 mg.  3. Singulair 10 mg.  4. Allegra 180 mg.  5. Nasonex.  6. Diovan/HCT 160/25 mg.  7. Potassium 10 mEq.  8. Allergy vaccine.  9. CPAP 10.  10.Albuterol inhaler.  11.Home nebulizer with albuterol.  12.Endal-HD.   No medication allergy.   OBJECTIVE:  VITAL SIGNS:  Weight 261 pounds.  BP 110/78, pulse regular  at 74, room air saturation 97%.  HEENT:  Tympanic membranes are bright, clear.  There is no obvious  tenderness behind her ear, no adenopathy, no joint crepitus.  CHEST:  Lung sounds clear.   IMPRESSION:  Question otitis or mastoiditis versus perhaps a neurogenic  pain.   PLAN:  Amoxicillin 500 mg t.i.d. x7 days, A-B Otic ear drops.  [facility] will [facility] her CPAP to 9 CWP.  Schedule return 4 months,  earlier p.r.n.     Clinton D. Maple Hudson, MD, Tonny Bollman, FACP  Electronically Signed    CDY/MedQ  DD: 05/05/2006  DT: 05/06/2006  Job #: [id number]

## 2010-06-15 NOTE — Assessment & Plan Note (Signed)
[facility]                             PULMONARY OFFICE NOTE   NALDA, SHACKLEFORD                    MRN:          [id number]  DATE:12/31/2005                            DOB:          [birth year]    PRIMARY CARE PHYSICIAN:  Georgina Quint. Plotnikov, MD   PROBLEM:  1. Asthmatic bronchitis.  2. Allergic rhinitis.  3. Chronic cough.  4. Obstructive sleep apnea.   HISTORY OF PRESENT ILLNESS:  She reports Aciphex has helped. She is  coughing less and is experiencing no heartburn. Recent trip to [place],  had a good time, no problems. She returns now to follow up after the  sleep study done because of the history of loud snoring and day time  sleepiness with complaints from her husband and son. Her polysomnogram  on December 12, 2005 confirmed moderate obstructive apnea with an index  of 38.4 per hour while sleeping supine. There was mouth breathing, mild  snoring, and the saturation nadir to 66%. Most studies happened late in  the night and there were insufficient early events to permit time for  CPAP titration. She is comfortable with her allergy vaccine holding  still at 1 to 50. We spent time today discussing her obstructive sleep  apnea, touching on aggravating factors such as, her obesity and nasal  congestion. We discussed available treatments, medical concerns, and her  responsibility to be safe while driving.   OBJECTIVE:  Weight 262 pounds. Blood pressure 112/74, pulse regular at  73, room air saturation 97%. There was mild turbinate edema with no  polyps, no mucous bridging. Her pharynx was clear. Voice quality normal.  LUNGS: Clear.  HEART: Sounds regular.   MEDICATIONS:  We reviewed her list. She remains on Advair 500/50,  Singular, Allegra 180, and Nasonex.   IMPRESSION:  Allergic rhinitis and asthma are stable with chronic mild  nasal stuffiness. She assures me she is using Nasonex. Her new problem  of obstructive sleep apnea should  respond well to CPAP.   PLAN:  We are arranging CPAP titration and will get her established at  home with a fixed pressure. Schedule return one month, come earlier  p.r.n.     Clinton D. Maple Hudson, MD, Tonny Bollman, FACP  Electronically Signed   CDY/MedQ  DD: 12/31/2005  DT: 01/01/2006  Job #: [id number]

## 2010-06-21 ENCOUNTER — Ambulatory Visit (INDEPENDENT_AMBULATORY_CARE_PROVIDER_SITE_OTHER): Payer: Medicare Other

## 2010-06-21 DIAGNOSIS — J309 Allergic rhinitis, unspecified: Secondary | ICD-10-CM

## 2010-06-29 ENCOUNTER — Ambulatory Visit (INDEPENDENT_AMBULATORY_CARE_PROVIDER_SITE_OTHER): Payer: Medicare Other

## 2010-06-29 DIAGNOSIS — J309 Allergic rhinitis, unspecified: Secondary | ICD-10-CM

## 2010-07-02 ENCOUNTER — Encounter: Payer: Self-pay | Admitting: Internal Medicine

## 2010-07-02 ENCOUNTER — Ambulatory Visit (INDEPENDENT_AMBULATORY_CARE_PROVIDER_SITE_OTHER): Payer: Medicare Other

## 2010-07-02 DIAGNOSIS — J309 Allergic rhinitis, unspecified: Secondary | ICD-10-CM

## 2010-07-09 ENCOUNTER — Ambulatory Visit (INDEPENDENT_AMBULATORY_CARE_PROVIDER_SITE_OTHER): Payer: Medicare Other

## 2010-07-09 DIAGNOSIS — J309 Allergic rhinitis, unspecified: Secondary | ICD-10-CM

## 2010-07-10 ENCOUNTER — Ambulatory Visit (INDEPENDENT_AMBULATORY_CARE_PROVIDER_SITE_OTHER): Payer: Medicare Other

## 2010-07-10 DIAGNOSIS — J309 Allergic rhinitis, unspecified: Secondary | ICD-10-CM

## 2010-07-16 ENCOUNTER — Ambulatory Visit (INDEPENDENT_AMBULATORY_CARE_PROVIDER_SITE_OTHER): Payer: Medicare Other

## 2010-07-16 DIAGNOSIS — J309 Allergic rhinitis, unspecified: Secondary | ICD-10-CM

## 2010-07-27 ENCOUNTER — Ambulatory Visit (INDEPENDENT_AMBULATORY_CARE_PROVIDER_SITE_OTHER): Payer: Medicare Other

## 2010-07-27 DIAGNOSIS — J309 Allergic rhinitis, unspecified: Secondary | ICD-10-CM

## 2010-07-30 ENCOUNTER — Telehealth: Payer: Self-pay | Admitting: Internal Medicine

## 2010-07-30 ENCOUNTER — Ambulatory Visit (INDEPENDENT_AMBULATORY_CARE_PROVIDER_SITE_OTHER): Payer: Medicare Other

## 2010-07-30 DIAGNOSIS — J309 Allergic rhinitis, unspecified: Secondary | ICD-10-CM

## 2010-07-30 MED ORDER — AZITHROMYCIN 250 MG PO TABS: ORAL_TABLET | ORAL | Status: AC

## 2010-07-30 NOTE — Telephone Encounter (Signed)
Pt called back again- still wanted to be seen today. Raliegh Scarlet explained that pt wouldn't be seen today but if she felt this was urgent then she should go to urgent care of ED. Hazel Sams

## 2010-07-30 NOTE — Telephone Encounter (Signed)
Per CDY, offer z pak take as directed #1 no refills and mucinex.    Called, spoke with pt. She was informed of CDY's recs to take zpak as directed on package and mucinex.  She is aware z pak sent to [facility].  She verbalized understanding of these instructions.

## 2010-07-30 NOTE — Telephone Encounter (Signed)
Spoke with pt. She is c/o prod cough with yellow sputum x 5 days, cough gets worse at night. She is also having wheezing and slight increase in SOB. Would like to be seen by CDY, no appts available. Please advise, thanks! NKDA

## 2010-08-02 ENCOUNTER — Telehealth: Payer: Self-pay | Admitting: Pulmonary Disease

## 2010-08-02 MED ORDER — PREDNISONE 10 MG PO TABS: ORAL_TABLET | ORAL | Status: DC

## 2010-08-02 MED ORDER — PROMETHAZINE-CODEINE 6.25-10 MG/5ML PO SYRP: 5.0000 mL | ORAL_SOLUTION | Freq: Four times a day (QID) | ORAL | Status: AC | PRN

## 2010-08-02 NOTE — Telephone Encounter (Signed)
rx called in. Pt aware.Carron Curie, CMA

## 2010-08-02 NOTE — Telephone Encounter (Signed)
Per CY-okay to give patient Prednisone 10 mg #20 take 4 x 2 days, 3 x 2 days, 2 x 2 days, 1 x 2 days, then stop no refills; Also, give Phenergan with codeine cough syrup #258ml 1 tsp every 6 hours prn no refills.

## 2010-08-02 NOTE — Telephone Encounter (Signed)
Spoke with patient-she states she has one more day left Zpak abx and would like to have a cough syrup to help with cough(worse at night). She is still coughing pretty hard and productive-yellow in color. Please advise.   NKDA.

## 2010-08-06 ENCOUNTER — Encounter: Payer: Self-pay | Admitting: Internal Medicine

## 2010-08-07 ENCOUNTER — Encounter: Payer: Self-pay | Admitting: Internal Medicine

## 2010-08-07 ENCOUNTER — Ambulatory Visit (INDEPENDENT_AMBULATORY_CARE_PROVIDER_SITE_OTHER): Payer: Medicare Other

## 2010-08-07 ENCOUNTER — Ambulatory Visit (INDEPENDENT_AMBULATORY_CARE_PROVIDER_SITE_OTHER): Payer: Medicare Other | Admitting: Internal Medicine

## 2010-08-07 VITALS — BP 120/76 | HR 51 | Ht 62.0 in | Wt 242.6 lb

## 2010-08-07 DIAGNOSIS — J309 Allergic rhinitis, unspecified: Secondary | ICD-10-CM

## 2010-08-07 DIAGNOSIS — J301 Allergic rhinitis due to pollen: Secondary | ICD-10-CM

## 2010-08-07 DIAGNOSIS — J45909 Unspecified asthma, uncomplicated: Secondary | ICD-10-CM

## 2010-08-07 DIAGNOSIS — J209 Acute bronchitis, unspecified: Secondary | ICD-10-CM

## 2010-08-07 DIAGNOSIS — G4733 Obstructive sleep apnea (adult) (pediatric): Secondary | ICD-10-CM

## 2010-08-07 MED ORDER — METHYLPREDNISOLONE ACETATE 80 MG/ML IJ SUSP
80.0000 mg | Freq: Once | INTRAMUSCULAR | Status: AC
  Administered 2010-08-07: 80 mg via INTRAMUSCULAR

## 2010-08-07 MED ORDER — AZITHROMYCIN 250 MG PO TABS: ORAL_TABLET | ORAL | Status: AC

## 2010-08-07 MED ORDER — PROMETHAZINE HCL 12.5 MG PO TABS: 12.5000 mg | ORAL_TABLET | Freq: Four times a day (QID) | ORAL | Status: AC | PRN

## 2010-08-07 MED ORDER — ARFORMOTEROL TARTRATE 15 MCG/2ML IN NEBU
15.0000 ug | INHALATION_SOLUTION | Freq: Once | RESPIRATORY_TRACT | Status: AC
  Administered 2010-08-07: 15 ug via RESPIRATORY_TRACT

## 2010-08-07 NOTE — Patient Instructions (Addendum)
Brovana tx  Depo 105  Script sent for phenergan and for Z pak

## 2010-08-07 NOTE — Assessment & Plan Note (Addendum)
Exacerbation probably due to heat and air quality. Can't exclude low grade infection because she thinks she had some fever. Will give neb, depo and Z pak. She continues allergy vaccine Her mild nausea may be a gastroenteritis but is mostly from drainage and hard coughing- can give phenergan.

## 2010-08-07 NOTE — Progress Notes (Signed)
  Subjective:    Patient ID: [name], female    DOB: 1950/12/01, 60 y.o.   MRN: [id number]  HPI 08/07/10- 60 yoF never smoker followed for asthma, allergic rhinitis,, OSA, obesity/hypoventilation, complicated by GERD, arthritis, HBP Last here January 31, 2010 CPAP still used every night, works well at [employer]. Allergy vaccine at 1;10, still seems to have helped.  Recent hot weather has been hard on her- coughing more, wheezing more, interfering with sleep. Sputum trace yellow. Had temp 101, no sore throat. Some nausea and vomiting  No chest pain.   Review of Systems Constitutional:   No weight loss, night sweats,   HEENT:   No headaches,  Difficulty swallowing,  Tooth/dental problems,  Sore throat,                No sneezing, itching, ear ache, nasal congestion, post nasal drip,   CV:  No chest pain,  Orthopnea, PND, swelling in lower extremities, anasarca, dizziness, palpitations  GI  No heartburn, indigestion, abdominal pain, nausea, vomiting, diarrhea, change in bowel habits, loss of appetite  Resp:   No coughing up of blood.  No change in color of mucus.  No wheezing.    Skin: no rash or lesions.  GU: no dysuria, change in color of urine, no urgency or frequency.  No flank pain.  MS:  No joint pain or swelling.  No decreased range of motion.  No back pain.  Psych:  No change in mood or affect. No depression or anxiety.  No memory loss.      Objective:   Physical Exam General- Alert, Oriented, Affect-appropriate, Distress- none acute  obese  Skin- rash-none, lesions- none, excoriation- none  Lymphadenopathy- none  Head- atraumatic  Eyes- Gross vision intact, PERRLA, conjunctivae clear secretions  Ears- Hearing, canals, Tm- normal  Nose- Clear, No- Septal dev, mucus, polyps, erosion, perforation   Throat- Mallampati II , mucosa clear , drainage- none, tonsils- atrophic    Voice raspy  Neck- flexible , trachea midline, no stridor , thyroid nl, carotid  no bruit  Chest - symmetrical excursion , unlabored     Heart/CV- RRR , no murmur , no gallop  , no rub, nl s1 s2                     - JVD- none , edema- none, stasis changes- none, varices- none     Lung- clear to P&A, wheeze- none, cough- none , dullness-none, rub- none     Chest wall-   Abd- tender-no, distended-no, bowel sounds-present, HSM- no  Br/ Gen/ Rectal- Not done, not indicated  Extrem- cyanosis- none, clubbing, none, atrophy- none, strength- nl  Neuro- grossly intact to observation         Assessment & Plan:

## 2010-08-07 NOTE — Assessment & Plan Note (Addendum)
Good complance and control on CPAP 9 Advanced Weight loss discussed.

## 2010-08-10 NOTE — Assessment & Plan Note (Signed)
Can't exclude viral tracheobronchitis in the last few days. Have discussed symptomatic therapy.

## 2010-08-10 NOTE — Assessment & Plan Note (Signed)
Continues allergy vaccine. Reminded of seasonal allergen and air pollution environmental controls.

## 2010-08-22 ENCOUNTER — Ambulatory Visit (INDEPENDENT_AMBULATORY_CARE_PROVIDER_SITE_OTHER): Payer: Medicare Other

## 2010-08-22 DIAGNOSIS — J309 Allergic rhinitis, unspecified: Secondary | ICD-10-CM

## 2010-08-31 ENCOUNTER — Ambulatory Visit (INDEPENDENT_AMBULATORY_CARE_PROVIDER_SITE_OTHER): Payer: Medicare Other

## 2010-08-31 DIAGNOSIS — J309 Allergic rhinitis, unspecified: Secondary | ICD-10-CM

## 2010-09-10 ENCOUNTER — Ambulatory Visit (INDEPENDENT_AMBULATORY_CARE_PROVIDER_SITE_OTHER): Payer: Medicare Other

## 2010-09-10 DIAGNOSIS — J309 Allergic rhinitis, unspecified: Secondary | ICD-10-CM

## 2010-09-17 ENCOUNTER — Ambulatory Visit (INDEPENDENT_AMBULATORY_CARE_PROVIDER_SITE_OTHER): Payer: Medicare Other

## 2010-09-17 DIAGNOSIS — J309 Allergic rhinitis, unspecified: Secondary | ICD-10-CM

## 2010-09-25 ENCOUNTER — Ambulatory Visit (INDEPENDENT_AMBULATORY_CARE_PROVIDER_SITE_OTHER): Payer: Medicare Other

## 2010-09-25 ENCOUNTER — Ambulatory Visit (INDEPENDENT_AMBULATORY_CARE_PROVIDER_SITE_OTHER): Payer: Medicare Other | Admitting: Internal Medicine

## 2010-09-25 ENCOUNTER — Encounter: Payer: Self-pay | Admitting: Internal Medicine

## 2010-09-25 VITALS — BP 110/80 | HR 84 | Temp 98.4°F | Resp 16 | Wt 239.0 lb

## 2010-09-25 DIAGNOSIS — M79603 Pain in arm, unspecified: Secondary | ICD-10-CM

## 2010-09-25 DIAGNOSIS — J301 Allergic rhinitis due to pollen: Secondary | ICD-10-CM

## 2010-09-25 DIAGNOSIS — M79609 Pain in unspecified limb: Secondary | ICD-10-CM

## 2010-09-25 DIAGNOSIS — J309 Allergic rhinitis, unspecified: Secondary | ICD-10-CM

## 2010-09-25 DIAGNOSIS — M79602 Pain in left arm: Secondary | ICD-10-CM

## 2010-09-25 DIAGNOSIS — J45909 Unspecified asthma, uncomplicated: Secondary | ICD-10-CM

## 2010-09-25 DIAGNOSIS — K219 Gastro-esophageal reflux disease without esophagitis: Secondary | ICD-10-CM

## 2010-09-25 MED ORDER — MELOXICAM 15 MG PO TABS: 15.0000 mg | ORAL_TABLET | Freq: Every day | ORAL | Status: DC | PRN

## 2010-09-25 NOTE — Progress Notes (Signed)
  Subjective:    Patient ID: [name], female    DOB: 10-07-1950, 60 y.o.   MRN: [id number]  Arm Pain  Pertinent negatives include no chest pain or numbness.  The patient presents for a follow-up of  chronic GERD, asthma controlled with medicines C/o heavy L arm pain at rest off and on (10-20 min) x 1 wk, last time on the wknd.    Review of Systems  Constitutional: Negative.  Negative for fever, chills, diaphoresis, activity change, appetite change, fatigue and unexpected weight change.  HENT: Negative for hearing loss, ear pain, nosebleeds, congestion, sore throat, facial swelling, rhinorrhea, sneezing, mouth sores, trouble swallowing, neck pain, neck stiffness, postnasal drip, sinus pressure and tinnitus.   Eyes: Negative for pain, discharge, redness, itching and visual disturbance.  Respiratory: Negative for cough, chest tightness, shortness of breath, wheezing and stridor.   Cardiovascular: Negative for chest pain, palpitations and leg swelling.  Gastrointestinal: Negative for nausea, diarrhea, constipation, blood in stool, abdominal distention, anal bleeding and rectal pain.  Genitourinary: Negative for dysuria, urgency, frequency, hematuria, flank pain, vaginal bleeding, vaginal discharge, difficulty urinating, genital sores and pelvic pain.  Musculoskeletal: Negative for back pain, joint swelling, arthralgias and gait problem.       L arm in the biceps area as above  Skin: Negative.  Negative for rash.  Neurological: Negative for dizziness, tremors, seizures, syncope, speech difficulty, weakness, numbness and headaches.  Hematological: Negative for adenopathy. Does not bruise/bleed easily.  Psychiatric/Behavioral: Negative for suicidal ideas, behavioral problems, sleep disturbance, dysphoric mood and decreased concentration. The patient is not nervous/anxious.        Objective:   Physical Exam  Constitutional: She appears well-developed and well-nourished. No distress.    HENT:  Head: Normocephalic.  Right Ear: External ear normal.  Left Ear: External ear normal.  Nose: Nose normal.  Mouth/Throat: Oropharynx is clear and moist.  Eyes: Conjunctivae are normal. Pupils are equal, round, and reactive to light. Right eye exhibits no discharge. Left eye exhibits no discharge.  Neck: Normal range of motion. Neck supple. No JVD present. No tracheal deviation present. No thyromegaly present.  Cardiovascular: Normal rate, regular rhythm and normal heart sounds.   Pulmonary/Chest: No stridor. No respiratory distress. She has no wheezes.  Abdominal: Soft. Bowel sounds are normal. She exhibits no distension and no mass. There is no tenderness. There is no rebound and no guarding.  Musculoskeletal: She exhibits no edema and no tenderness.  Lymphadenopathy:    She has no cervical adenopathy.  Neurological: She displays normal reflexes. No cranial nerve deficit. She exhibits normal muscle tone. Coordination normal.  Skin: No rash noted. No erythema.  Psychiatric: She has a normal mood and affect. Her behavior is normal. Judgment and thought content normal.   L mid-biceps is sensitive to palpation. No mass.   Procedure Note :    Procedure :   Point of care (POC) sonography examination   Indication: L biceps pain x 1 wk   Equipment used: Sonosite M-Turbo with HFL38x/13-6 MHz transducer linear probe. The images were stored in the unit and later transferred in storage.  The patient was placed in a sitting position.  This study revealed no abnormalities  in B biceps    Impression: No obvious biceps pathology       Assessment & Plan:

## 2010-09-28 ENCOUNTER — Encounter: Payer: Self-pay | Admitting: Internal Medicine

## 2010-09-28 DIAGNOSIS — M79603 Pain in arm, unspecified: Secondary | ICD-10-CM | POA: Insufficient documentation

## 2010-09-28 NOTE — Assessment & Plan Note (Signed)
Continue with current prescription therapy as reflected on the Med list.  

## 2010-09-28 NOTE — Assessment & Plan Note (Signed)
On Rx 

## 2010-09-28 NOTE — Assessment & Plan Note (Signed)
EKG was nl [place] was OK Meloxicam

## 2010-10-05 ENCOUNTER — Ambulatory Visit (INDEPENDENT_AMBULATORY_CARE_PROVIDER_SITE_OTHER): Payer: Medicare Other

## 2010-10-05 DIAGNOSIS — J309 Allergic rhinitis, unspecified: Secondary | ICD-10-CM

## 2010-10-15 ENCOUNTER — Other Ambulatory Visit (HOSPITAL_COMMUNITY): Payer: Medicare Other | Admitting: Radiology

## 2010-10-27 ENCOUNTER — Other Ambulatory Visit: Payer: Self-pay | Admitting: Internal Medicine

## 2010-10-29 ENCOUNTER — Other Ambulatory Visit: Payer: Self-pay | Admitting: *Deleted

## 2010-10-29 MED ORDER — OMEPRAZOLE 20 MG PO CPDR: 20.0000 mg | DELAYED_RELEASE_CAPSULE | Freq: Every day | ORAL | Status: DC

## 2010-11-06 ENCOUNTER — Ambulatory Visit: Payer: Medicare Other | Admitting: Internal Medicine

## 2010-11-06 ENCOUNTER — Ambulatory Visit (HOSPITAL_COMMUNITY): Payer: Medicare Other | Attending: Internal Medicine | Admitting: Radiology

## 2010-11-06 ENCOUNTER — Other Ambulatory Visit (HOSPITAL_COMMUNITY): Payer: Medicare Other | Admitting: Radiology

## 2010-11-06 ENCOUNTER — Ambulatory Visit (INDEPENDENT_AMBULATORY_CARE_PROVIDER_SITE_OTHER): Payer: Medicare Other

## 2010-11-06 VITALS — Ht 61.25 in | Wt 238.0 lb

## 2010-11-06 DIAGNOSIS — J309 Allergic rhinitis, unspecified: Secondary | ICD-10-CM

## 2010-11-06 DIAGNOSIS — Z23 Encounter for immunization: Secondary | ICD-10-CM

## 2010-11-06 DIAGNOSIS — R5383 Other fatigue: Secondary | ICD-10-CM | POA: Insufficient documentation

## 2010-11-06 DIAGNOSIS — M79609 Pain in unspecified limb: Secondary | ICD-10-CM | POA: Insufficient documentation

## 2010-11-06 DIAGNOSIS — J449 Chronic obstructive pulmonary disease, unspecified: Secondary | ICD-10-CM | POA: Insufficient documentation

## 2010-11-06 DIAGNOSIS — R0609 Other forms of dyspnea: Secondary | ICD-10-CM | POA: Insufficient documentation

## 2010-11-06 DIAGNOSIS — I10 Essential (primary) hypertension: Secondary | ICD-10-CM | POA: Insufficient documentation

## 2010-11-06 DIAGNOSIS — J4489 Other specified chronic obstructive pulmonary disease: Secondary | ICD-10-CM | POA: Insufficient documentation

## 2010-11-06 DIAGNOSIS — Z8249 Family history of ischemic heart disease and other diseases of the circulatory system: Secondary | ICD-10-CM | POA: Insufficient documentation

## 2010-11-06 DIAGNOSIS — R5381 Other malaise: Secondary | ICD-10-CM | POA: Insufficient documentation

## 2010-11-06 DIAGNOSIS — M79602 Pain in left arm: Secondary | ICD-10-CM

## 2010-11-06 DIAGNOSIS — R0989 Other specified symptoms and signs involving the circulatory and respiratory systems: Secondary | ICD-10-CM | POA: Insufficient documentation

## 2010-11-06 MED ORDER — TECHNETIUM TC 99M TETROFOSMIN IV KIT
33.0000 | PACK | Freq: Once | INTRAVENOUS | Status: AC | PRN
  Administered 2010-11-06: 33 via INTRAVENOUS

## 2010-11-06 MED ORDER — REGADENOSON 0.4 MG/5ML IV SOLN
0.4000 mg | Freq: Once | INTRAVENOUS | Status: AC
  Administered 2010-11-06: 0.4 mg via INTRAVENOUS

## 2010-11-06 NOTE — Progress Notes (Signed)
[facility] SITE 3 NUCLEAR MED [address] [place] [address] [phone]  Cardiology Nuclear Med Study  [name] is a 60 y.o. female [id number] 1950-12-02   Nuclear Med Background Indication for Stress Test:  Evaluation for Ischemia History:  Asthma and COPD Cardiac Risk Factors: Family History - CAD and Hypertension  Symptoms:  DOE, Fatigue, Fatigue with Exertion and Heaviness (L) arm intermittent at rest, last noticed on 09/25/10   Nuclear Pre-Procedure Caffeine/Decaff Intake:  None NPO After: 7:00am   Lungs:  Clear IV 0.9% NS with Angio Cath:  22g  IV Site: L Hand x 1, tolerated well  IV Started by:  Irean Hong, RN  Chest Size (in):  44 Cup Size: D  Height: 5' 1.25" (1.556 m)  Weight:  238 lb (107.956 kg)  BMI:  Body mass index is 44.60 kg/(m^2). Tech Comments:Nebulizer treatment early am at home.  ProAir inhaler 2 sprays prior lexiscan    Nuclear Med Study 1 or 2 day study: 2 day  Stress Test Type:  Lexiscan  Reading OZ:HYQMVH Mclean,M.D. Order Authorizing Provider:  Sonda Primes, MD  Resting Radionuclide: Technetium 31m Tetrofosmin  Resting Radionuclide Dose: 33.0 mCi   Stress Radionuclide:  Technetium 29m Tetrofosmin  Stress Radionuclide Dose: 33.0 mCi           Stress Protocol Rest HR: 83 Stress HR: 112  Rest BP: 107/65 Stress BP: 116/56  Exercise Time (min): n/a METS: n/a   Predicted Max HR: 159 bpm % Max HR: 70.44 bpm Rate Pressure Product: 84696   Dose of Adenosine (mg):  n/a Dose of Lexiscan: 0.4 mg  Dose of Atropine (mg): n/a Dose of Dobutamine: n/a mcg/kg/min (at max HR)  Stress Test Technologist: Irean Hong, RN  Nuclear Technologist:  Domenic Polite, CNMT     Rest Procedure:  Myocardial perfusion imaging was performed at rest 45 minutes following the intravenous administration of Technetium 40m Tetrofosmin. Rest ECG: NSR  Stress Procedure:  The patient received IV Lexiscan 0.4 mg over 15-seconds.   Technetium 64m Tetrofosmin injected at 30-seconds.  There were no significant changes with Lexiscan.  Quantitative spect images were obtained after a 45 minute delay. Stress ECG: No significant change from baseline ECG  QPS Raw Data Images:  Normal; no motion artifact; normal heart/lung ratio. Stress Images:  Normal homogeneous uptake in all areas of the myocardium. Rest Images:  Normal homogeneous uptake in all areas of the myocardium. Subtraction (SDS):  There is no evidence of scar or ischemia. Transient Ischemic Dilatation (Normal <1.22):  0.96 Lung/Heart Ratio (Normal <0.45):  0.26  Quantitative Gated Spect Images QGS EDV:  65 ml QGS ESV:  20 ml QGS cine images:  NL LV Function; NL Wall Motion QGS EF: 70%  Impression Exercise Capacity:  Lexiscan with no exercise. BP Response:  Normal blood pressure response. Clinical Symptoms:  Chest tightness ECG Impression:  No significant ST segment change suggestive of ischemia. Comparison with Prior Nuclear Study: No previous nuclear study performed  Overall Impression:  Normal stress nuclear study. [employer]

## 2010-11-07 ENCOUNTER — Ambulatory Visit (HOSPITAL_COMMUNITY): Payer: Medicare Other | Attending: Internal Medicine | Admitting: Radiology

## 2010-11-07 DIAGNOSIS — R0989 Other specified symptoms and signs involving the circulatory and respiratory systems: Secondary | ICD-10-CM

## 2010-11-07 MED ORDER — TECHNETIUM TC 99M TETROFOSMIN IV KIT
33.0000 | PACK | Freq: Once | INTRAVENOUS | Status: AC | PRN
  Administered 2010-11-07: 33 via INTRAVENOUS

## 2010-11-09 ENCOUNTER — Telehealth: Payer: Self-pay | Admitting: Internal Medicine

## 2010-11-09 NOTE — Telephone Encounter (Signed)
[name], please, inform patient that her stress test was ok Thx

## 2010-11-12 NOTE — Telephone Encounter (Signed)
Pt informed

## 2010-11-15 ENCOUNTER — Telehealth: Payer: Self-pay | Admitting: Internal Medicine

## 2010-11-15 MED ORDER — AZITHROMYCIN 250 MG PO TABS: ORAL_TABLET | ORAL | Status: AC

## 2010-11-15 NOTE — Telephone Encounter (Signed)
Per CY-okay to give Zpak #1 take as directed no refills. Use OTC Mucinex may help and OTC delsym if needed.

## 2010-11-15 NOTE — Telephone Encounter (Signed)
Called and spoke with pt and she stated that she is having dry cough and nasal congestion since Tuesday.  Pt is requesting recs for this.  CY please advise.   Pt has no drug allergies.

## 2010-11-15 NOTE — Telephone Encounter (Signed)
Per CY--ok to use otc mucinex and delsym as needed.  zpak has been sent to the pharmacy per pts request.  Called and spoke with pt and she is aware of meds and recs per CY.

## 2010-11-19 ENCOUNTER — Telehealth: Payer: Self-pay | Admitting: Internal Medicine

## 2010-11-19 MED ORDER — PROMETHAZINE-CODEINE 6.25-10 MG/5ML PO SYRP: 5.0000 mL | ORAL_SOLUTION | Freq: Four times a day (QID) | ORAL | Status: AC | PRN

## 2010-11-19 NOTE — Telephone Encounter (Signed)
Spoke with pt and notified of recs per CDY. Pt verbalized understanding. Rx was called to pharm.

## 2010-11-19 NOTE — Telephone Encounter (Signed)
Called and spoke with pt.  Pt states she finished z pak today that was given to her per phone note on 10/18.  Pt has also tried Mucinex and Delsym but states she continues to have a non productive cough throughout the day but is esp worse at night.  States she has difficulty sleeping d/t increased coughing.  Also c/o wheezing at night.  Denies fever or sweats but does c/o chills.  Pt requests rx to help with cough- states she needs something that "medicare will cover."  Cy please advise.  Thanks. No Known Allergies

## 2010-11-19 NOTE — Telephone Encounter (Signed)
Per CY-Ok to give Phenergan with Codeine cough syrup #234ml 1 tsp every 6 hours prn cough no refills.

## 2010-11-30 ENCOUNTER — Ambulatory Visit (INDEPENDENT_AMBULATORY_CARE_PROVIDER_SITE_OTHER): Payer: Medicare Other

## 2010-11-30 DIAGNOSIS — J309 Allergic rhinitis, unspecified: Secondary | ICD-10-CM

## 2010-12-04 ENCOUNTER — Ambulatory Visit (INDEPENDENT_AMBULATORY_CARE_PROVIDER_SITE_OTHER): Payer: Medicare Other

## 2010-12-04 DIAGNOSIS — J309 Allergic rhinitis, unspecified: Secondary | ICD-10-CM

## 2010-12-06 ENCOUNTER — Other Ambulatory Visit: Payer: Self-pay | Admitting: *Deleted

## 2010-12-06 MED ORDER — ALBUTEROL SULFATE (2.5 MG/3ML) 0.083% IN NEBU: 2.5000 mg | INHALATION_SOLUTION | Freq: Four times a day (QID) | RESPIRATORY_TRACT | Status: DC | PRN

## 2010-12-14 ENCOUNTER — Ambulatory Visit (INDEPENDENT_AMBULATORY_CARE_PROVIDER_SITE_OTHER): Payer: Medicare Other

## 2010-12-14 DIAGNOSIS — J309 Allergic rhinitis, unspecified: Secondary | ICD-10-CM

## 2010-12-18 ENCOUNTER — Ambulatory Visit (INDEPENDENT_AMBULATORY_CARE_PROVIDER_SITE_OTHER): Payer: Medicare Other

## 2010-12-18 DIAGNOSIS — J309 Allergic rhinitis, unspecified: Secondary | ICD-10-CM

## 2010-12-19 ENCOUNTER — Ambulatory Visit (INDEPENDENT_AMBULATORY_CARE_PROVIDER_SITE_OTHER): Payer: Medicare Other

## 2010-12-19 DIAGNOSIS — J309 Allergic rhinitis, unspecified: Secondary | ICD-10-CM

## 2010-12-27 ENCOUNTER — Ambulatory Visit (INDEPENDENT_AMBULATORY_CARE_PROVIDER_SITE_OTHER): Payer: Medicare Other

## 2010-12-27 DIAGNOSIS — J309 Allergic rhinitis, unspecified: Secondary | ICD-10-CM

## 2011-01-03 ENCOUNTER — Telehealth: Payer: Self-pay | Admitting: Internal Medicine

## 2011-01-03 ENCOUNTER — Ambulatory Visit: Payer: Medicare Other | Admitting: Internal Medicine

## 2011-01-03 NOTE — Telephone Encounter (Signed)
Per CY-viral---Lots of fluids, Mucinex DM, and Tylenol if needed for pain or fever.

## 2011-01-03 NOTE — Telephone Encounter (Signed)
Called spoke with patient who c/o dry cough mostly at night, sore throat, hoarseness, chills x2days - denies SOB, wheezing, head congestion, PND.  [facility] [address]  [facility].  Dr Maple Hudson please advise, thanks.  Last ov: 07/2010, f/u 6 months.

## 2011-01-03 NOTE — Telephone Encounter (Signed)
Spoke with pt and notified of recs per CDY She verbalized understanding and states nothing further needed 

## 2011-01-11 ENCOUNTER — Ambulatory Visit (INDEPENDENT_AMBULATORY_CARE_PROVIDER_SITE_OTHER): Payer: Medicare Other

## 2011-01-11 DIAGNOSIS — J309 Allergic rhinitis, unspecified: Secondary | ICD-10-CM

## 2011-01-15 ENCOUNTER — Telehealth: Payer: Self-pay | Admitting: Internal Medicine

## 2011-01-15 MED ORDER — PREDNISONE 10 MG PO TABS: ORAL_TABLET | ORAL | Status: DC

## 2011-01-15 NOTE — Telephone Encounter (Signed)
I spoke with [name] and she states she can't get rid of her cough. [name] is getting up yellow phlem during the day but dry at night and is having some wheezing-Denies any fever, nausea, vomiting. [name] states she has tried mucinex but has had no relief. [name] is requesting something be called in for this. [name] last OV 08/07/10 f/u in 6 months. [name] due for OV 02/05/11. Please advise Dr. Maple Hudson, thanks  No Known Allergies

## 2011-01-15 NOTE — Telephone Encounter (Signed)
Per CY---suggest pred taper for cough and wheezing, push fluids and mucinex. Called and spoke with pt and she is aware of meds sent to the pharmacy

## 2011-01-16 ENCOUNTER — Ambulatory Visit (INDEPENDENT_AMBULATORY_CARE_PROVIDER_SITE_OTHER): Payer: Medicare Other

## 2011-01-16 DIAGNOSIS — J309 Allergic rhinitis, unspecified: Secondary | ICD-10-CM

## 2011-02-01 ENCOUNTER — Ambulatory Visit (INDEPENDENT_AMBULATORY_CARE_PROVIDER_SITE_OTHER): Payer: Medicare Other

## 2011-02-01 DIAGNOSIS — J309 Allergic rhinitis, unspecified: Secondary | ICD-10-CM | POA: Diagnosis not present

## 2011-02-05 ENCOUNTER — Ambulatory Visit: Payer: Medicare Other | Admitting: Internal Medicine

## 2011-02-18 ENCOUNTER — Ambulatory Visit (INDEPENDENT_AMBULATORY_CARE_PROVIDER_SITE_OTHER): Payer: Medicare Other

## 2011-02-18 DIAGNOSIS — J309 Allergic rhinitis, unspecified: Secondary | ICD-10-CM | POA: Diagnosis not present

## 2011-02-26 ENCOUNTER — Ambulatory Visit (INDEPENDENT_AMBULATORY_CARE_PROVIDER_SITE_OTHER): Payer: Medicare Other

## 2011-02-26 DIAGNOSIS — J309 Allergic rhinitis, unspecified: Secondary | ICD-10-CM

## 2011-03-07 ENCOUNTER — Ambulatory Visit (INDEPENDENT_AMBULATORY_CARE_PROVIDER_SITE_OTHER): Payer: Medicare Other

## 2011-03-07 DIAGNOSIS — J309 Allergic rhinitis, unspecified: Secondary | ICD-10-CM

## 2011-03-13 ENCOUNTER — Encounter: Payer: Self-pay | Admitting: Internal Medicine

## 2011-03-15 ENCOUNTER — Ambulatory Visit (INDEPENDENT_AMBULATORY_CARE_PROVIDER_SITE_OTHER): Payer: Medicare Other

## 2011-03-15 DIAGNOSIS — J309 Allergic rhinitis, unspecified: Secondary | ICD-10-CM

## 2011-03-20 ENCOUNTER — Telehealth: Payer: Self-pay | Admitting: Internal Medicine

## 2011-03-20 MED ORDER — AZITHROMYCIN 250 MG PO TABS: ORAL_TABLET | ORAL | Status: AC

## 2011-03-20 MED ORDER — PREDNISONE 10 MG PO TABS: ORAL_TABLET | ORAL | Status: DC

## 2011-03-20 NOTE — Telephone Encounter (Signed)
Called, spoke with pt.  She c/o wheezing, "bad cough," unable to sleep at night d/t coughing, some SOB, and some chest tightness - started Sunday.  Fever 101.4 yesterday.  Cough is prod with brown mucus.  She's been using regular maintance inhalers along with albuterol neb with no relief.  No openings today with Dr. Maple Hudson.  Pt would like something called in.  [facility]  [facility] - verified.

## 2011-03-20 NOTE — Telephone Encounter (Signed)
Per CDY: can call in zpak and pred 10 mg 8 day taper. I spoke with pt and is aware of CDY recs. She voiced her understanding and rx has been called into the pharmacy. Nothing further was needed

## 2011-03-22 ENCOUNTER — Ambulatory Visit (INDEPENDENT_AMBULATORY_CARE_PROVIDER_SITE_OTHER): Payer: Medicare Other

## 2011-03-22 DIAGNOSIS — J309 Allergic rhinitis, unspecified: Secondary | ICD-10-CM

## 2011-03-26 ENCOUNTER — Ambulatory Visit (INDEPENDENT_AMBULATORY_CARE_PROVIDER_SITE_OTHER): Payer: Medicare Other

## 2011-03-26 DIAGNOSIS — J309 Allergic rhinitis, unspecified: Secondary | ICD-10-CM

## 2011-04-02 ENCOUNTER — Ambulatory Visit (INDEPENDENT_AMBULATORY_CARE_PROVIDER_SITE_OTHER): Payer: Medicare Other

## 2011-04-02 DIAGNOSIS — J309 Allergic rhinitis, unspecified: Secondary | ICD-10-CM

## 2011-04-04 ENCOUNTER — Telehealth: Payer: Self-pay | Admitting: Internal Medicine

## 2011-04-04 NOTE — Telephone Encounter (Signed)
LMTCB

## 2011-04-05 MED ORDER — PREDNISONE 10 MG PO TABS: ORAL_TABLET | ORAL | Status: DC

## 2011-04-05 NOTE — Telephone Encounter (Signed)
Per CY-okay to give Prednisone 10 mg #20 take 4 x 2 days, 3 x 2 days, 2 x 2 days, 1 x 2 days, then stop no refills. Also use Delsym OTC.

## 2011-04-05 NOTE — Telephone Encounter (Signed)
Pt aware that Rx sent to [facility] [place], [place] and to use Delsym OTC.

## 2011-04-05 NOTE — Telephone Encounter (Signed)
Spoke with pt. She is c/o dry cough, slight increase in SOB and also wheezing x 5 days. She would like something called in to help her cough b.c she states can not sleep at night due to cough. No fever, CP or other complaints. Please advise, thanks! No Known Allergies

## 2011-04-05 NOTE — Telephone Encounter (Signed)
Pt returned triage's call & can be reached at [phone] if unable [phone].  Pt was upset that she did not speak w/ someone yesterday.  [facility] [address] [facility] in [place], [place].  Antionette Fairy

## 2011-04-10 ENCOUNTER — Ambulatory Visit (INDEPENDENT_AMBULATORY_CARE_PROVIDER_SITE_OTHER): Payer: Medicare Other

## 2011-04-10 DIAGNOSIS — J309 Allergic rhinitis, unspecified: Secondary | ICD-10-CM | POA: Diagnosis not present

## 2011-04-16 ENCOUNTER — Ambulatory Visit (INDEPENDENT_AMBULATORY_CARE_PROVIDER_SITE_OTHER): Payer: Medicare Other

## 2011-04-16 DIAGNOSIS — J309 Allergic rhinitis, unspecified: Secondary | ICD-10-CM | POA: Diagnosis not present

## 2011-05-03 ENCOUNTER — Ambulatory Visit (INDEPENDENT_AMBULATORY_CARE_PROVIDER_SITE_OTHER): Payer: Medicare Other

## 2011-05-03 ENCOUNTER — Other Ambulatory Visit: Payer: Self-pay | Admitting: Internal Medicine

## 2011-05-03 DIAGNOSIS — J309 Allergic rhinitis, unspecified: Secondary | ICD-10-CM | POA: Diagnosis not present

## 2011-05-08 ENCOUNTER — Ambulatory Visit (INDEPENDENT_AMBULATORY_CARE_PROVIDER_SITE_OTHER): Payer: Medicare Other

## 2011-05-08 DIAGNOSIS — J309 Allergic rhinitis, unspecified: Secondary | ICD-10-CM | POA: Diagnosis not present

## 2011-05-14 ENCOUNTER — Ambulatory Visit (INDEPENDENT_AMBULATORY_CARE_PROVIDER_SITE_OTHER): Payer: Medicare Other

## 2011-05-14 DIAGNOSIS — J309 Allergic rhinitis, unspecified: Secondary | ICD-10-CM

## 2011-05-17 ENCOUNTER — Other Ambulatory Visit: Payer: Self-pay | Admitting: Internal Medicine

## 2011-05-24 ENCOUNTER — Ambulatory Visit (INDEPENDENT_AMBULATORY_CARE_PROVIDER_SITE_OTHER): Payer: Medicare Other

## 2011-05-24 DIAGNOSIS — J309 Allergic rhinitis, unspecified: Secondary | ICD-10-CM

## 2011-05-26 ENCOUNTER — Other Ambulatory Visit: Payer: Self-pay | Admitting: Internal Medicine

## 2011-05-28 ENCOUNTER — Ambulatory Visit (INDEPENDENT_AMBULATORY_CARE_PROVIDER_SITE_OTHER): Payer: Medicare Other

## 2011-05-28 DIAGNOSIS — J309 Allergic rhinitis, unspecified: Secondary | ICD-10-CM

## 2011-06-03 ENCOUNTER — Ambulatory Visit (INDEPENDENT_AMBULATORY_CARE_PROVIDER_SITE_OTHER): Payer: Medicare Other

## 2011-06-03 DIAGNOSIS — J309 Allergic rhinitis, unspecified: Secondary | ICD-10-CM

## 2011-06-11 ENCOUNTER — Ambulatory Visit (INDEPENDENT_AMBULATORY_CARE_PROVIDER_SITE_OTHER): Payer: Medicare Other

## 2011-06-11 DIAGNOSIS — J309 Allergic rhinitis, unspecified: Secondary | ICD-10-CM

## 2011-06-19 ENCOUNTER — Ambulatory Visit (INDEPENDENT_AMBULATORY_CARE_PROVIDER_SITE_OTHER): Payer: Medicare Other

## 2011-06-19 DIAGNOSIS — J309 Allergic rhinitis, unspecified: Secondary | ICD-10-CM

## 2011-06-26 ENCOUNTER — Other Ambulatory Visit: Payer: Self-pay | Admitting: Internal Medicine

## 2011-07-01 ENCOUNTER — Other Ambulatory Visit: Payer: Self-pay | Admitting: *Deleted

## 2011-07-01 ENCOUNTER — Ambulatory Visit (INDEPENDENT_AMBULATORY_CARE_PROVIDER_SITE_OTHER): Payer: Medicare Other

## 2011-07-01 DIAGNOSIS — J309 Allergic rhinitis, unspecified: Secondary | ICD-10-CM

## 2011-07-01 MED ORDER — VALSARTAN-HYDROCHLOROTHIAZIDE 160-25 MG PO TABS: 1.0000 | ORAL_TABLET | Freq: Every day | ORAL | Status: DC

## 2011-07-09 ENCOUNTER — Encounter: Payer: Self-pay | Admitting: Internal Medicine

## 2011-08-02 ENCOUNTER — Ambulatory Visit (INDEPENDENT_AMBULATORY_CARE_PROVIDER_SITE_OTHER): Payer: Medicare Other

## 2011-08-02 DIAGNOSIS — J309 Allergic rhinitis, unspecified: Secondary | ICD-10-CM

## 2011-08-12 ENCOUNTER — Telehealth: Payer: Self-pay | Admitting: Internal Medicine

## 2011-08-12 NOTE — Telephone Encounter (Signed)
Per pt, [facility] needs documentation that she is CDY pt and still needs nebulizer medications. Pt has been scheduled for follow-up on Thurs., 09/19/11 @ 3:15pm. I called [facility] at [phone] and spoke with the billing dept. I was told that they are needing office notes for this year which are required by Medicare. I informed them that the pt has not been seen yet and will be coming for Ov in August 2013. They will check on this and see if [facility] will pay for the pt's medication until ov. They will call back with this information.

## 2011-08-12 NOTE — Telephone Encounter (Signed)
Spoke with rep from [facility]. She states that the ov notes that we faxed were over 62 yr old and do not mention that the pt needs albuterol nebs and therefore they will not be able to provide this medication for the pt until she is seen here again and we fax her note that must contain information regarding the need for nebs. She states that she also needs pt's med list signed and dated by Dr Maple Hudson. She is aware he is out of the office until next wk. Would like to know if we can get the pt in to see CDY sooner than 09/19/11 so she can get her medicine. Please advise thanks

## 2011-08-12 NOTE — Telephone Encounter (Signed)
Pt states she needs an ov for July for medicare purposes.[name]

## 2011-08-12 NOTE — Telephone Encounter (Signed)
Spoke with pt. She states can come in on 08-23-11 at 11:15. Appt was scheduled.

## 2011-08-12 NOTE — Telephone Encounter (Signed)
Pt can be put on Friday 08-23-11 at 1115am, Monday 08-26-11 at 1115am, or Tuesday 08-27-11 at 1115am. Thanks.

## 2011-08-23 ENCOUNTER — Ambulatory Visit (INDEPENDENT_AMBULATORY_CARE_PROVIDER_SITE_OTHER)
Admission: RE | Admit: 2011-08-23 | Discharge: 2011-08-23 | Disposition: A | Discharge status: Home / Self Care | Payer: Medicare Other | Source: Ambulatory Visit | Attending: Internal Medicine | Admitting: Internal Medicine

## 2011-08-23 ENCOUNTER — Ambulatory Visit (INDEPENDENT_AMBULATORY_CARE_PROVIDER_SITE_OTHER): Payer: Medicare Other

## 2011-08-23 ENCOUNTER — Encounter: Payer: Self-pay | Admitting: Internal Medicine

## 2011-08-23 ENCOUNTER — Telehealth: Payer: Self-pay | Admitting: Internal Medicine

## 2011-08-23 ENCOUNTER — Ambulatory Visit (INDEPENDENT_AMBULATORY_CARE_PROVIDER_SITE_OTHER): Payer: Medicare Other | Admitting: Internal Medicine

## 2011-08-23 VITALS — BP 108/70 | HR 52 | Ht 62.0 in | Wt 235.2 lb

## 2011-08-23 DIAGNOSIS — J301 Allergic rhinitis due to pollen: Secondary | ICD-10-CM

## 2011-08-23 DIAGNOSIS — G4733 Obstructive sleep apnea (adult) (pediatric): Secondary | ICD-10-CM | POA: Diagnosis not present

## 2011-08-23 DIAGNOSIS — J309 Allergic rhinitis, unspecified: Secondary | ICD-10-CM

## 2011-08-23 DIAGNOSIS — J40 Bronchitis, not specified as acute or chronic: Secondary | ICD-10-CM | POA: Diagnosis not present

## 2011-08-23 DIAGNOSIS — J45909 Unspecified asthma, uncomplicated: Secondary | ICD-10-CM

## 2011-08-23 DIAGNOSIS — J209 Acute bronchitis, unspecified: Secondary | ICD-10-CM | POA: Diagnosis not present

## 2011-08-23 DIAGNOSIS — R05 Cough: Secondary | ICD-10-CM | POA: Diagnosis not present

## 2011-08-23 MED ORDER — ALBUTEROL SULFATE HFA 108 (90 BASE) MCG/ACT IN AERS: 2.0000 | INHALATION_SPRAY | Freq: Four times a day (QID) | RESPIRATORY_TRACT | Status: DC | PRN

## 2011-08-23 MED ORDER — FLUTICASONE-SALMETEROL 250-50 MCG/DOSE IN AEPB: INHALATION_SPRAY | RESPIRATORY_TRACT | Status: DC

## 2011-08-23 MED ORDER — ALBUTEROL SULFATE (2.5 MG/3ML) 0.083% IN NEBU: 2.5000 mg | INHALATION_SOLUTION | Freq: Four times a day (QID) | RESPIRATORY_TRACT | Status: DC | PRN

## 2011-08-23 MED ORDER — MOMETASONE FUROATE 50 MCG/ACT NA SUSP: 2.0000 | Freq: Every day | NASAL | Status: DC | PRN

## 2011-08-23 NOTE — Progress Notes (Signed)
Subjective:    Patient ID: [name], female    DOB: [birth year], 61 y.o.   MRN: [id number]  HPI 08/07/10- 60 yoF never smoker followed for asthma, allergic rhinitis,, OSA, obesity/hypoventilation, complicated by GERD, arthritis, HBP Last here January 31, 2010 CPAP still used every night, works well at [employer]. Allergy vaccine at 1;10, still seems to have helped.  Recent hot weather has been hard on her- coughing more, wheezing more, interfering with sleep. Sputum trace yellow. Had temp 101, no sore throat. Some nausea and vomiting  No chest pain.    08/23/11-  61 yoF never smoker followed for asthma, allergic rhinitis,, OSA, obesity/hypoventilation, complicated by GERD, arthritis, HBP Patient states a little better since last visit. c/o dry cough x 1 week.  Denies sob, wheezing, chest pain, and chest tightness.  She likes humid weather that we have had this year better than very dry weather. She reports doing well with her allergy vaccine at 1:10 GH. Using rescue inhaler or nebulizer about once daily. She depends on her nebulizer/ albuterol neb solution especially if she's a little tighter. We discussed appropriate use. Walking regularly 2-3 times per week. She reports good compliance and control with her CPAP 9/ Advanced.  ROS-see HPI Constitutional:   No-   weight loss, night sweats, fevers, chills, fatigue, lassitude. HEENT:   No-  headaches, difficulty swallowing, tooth/dental problems, sore throat,       No-  sneezing, itching, ear ache, nasal congestion, post nasal drip,  CV:  No-   chest pain, orthopnea, PND, swelling in lower extremities, anasarca, dizziness, palpitations Resp: No- acute  shortness of breath with exertion or at rest.              No-   productive cough,  +non-productive cough,  No- coughing up of blood.              No-   change in color of mucus.  No-sustained wheezing.   Skin: No-   rash or lesions. GI:  No-   heartburn, indigestion, abdominal pain,  nausea, vomiting,  GU: No  complaint MS:  No-   joint pain or swelling.   Neuro-     nothing unusual Psych:  No- change in mood or affect. No depression or anxiety.  No memory loss.  OBJ- Physical Exam General- Alert, Oriented, Affect-appropriate, Distress- none acute Skin- rash-none, lesions- none, excoriation- none Lymphadenopathy- none Head- atraumatic            Eyes- Gross vision intact, PERRLA, conjunctivae and secretions clear            Ears- Hearing, canals-normal            Nose- Clear, no-Septal dev, mucus, polyps, erosion, perforation             Throat- Mallampati II , mucosa clear , drainage- none, tonsils- atrophic Neck- flexible , trachea midline, no stridor , thyroid nl, carotid no bruit Chest - symmetrical excursion , unlabored           Heart/CV- RRR , no murmur , no gallop  , no rub, nl s1 s2                           - JVD- none , edema- none, stasis changes- none, varices- none           Lung- clear to P&A, wheeze- none, + mild dry cough , dullness-none, rub- none  Chest wall-  Abd-  Br/ Gen/ Rectal- Not done, not indicated Extrem- cyanosis- none, clubbing, none, atrophy- none, strength- nl Neuro- grossly intact to observation

## 2011-08-23 NOTE — Patient Instructions (Addendum)
Script sent for neb solution to [facility]  Scripts printed for your other medicines  Please call as needed  Order CXR  Dx asthma with bronchitis  Continue allergy vaccine

## 2011-08-23 NOTE — Telephone Encounter (Signed)
Called spoke with Boyd Kerbs w/ [facility] who is the supplier for pt's albuterol neb.  They are currently being audited by Medicare and are unable to send pt her medication until they receive documentation from CY regarding her asthma or bronchitis.  Requesting today's ov note as the last one on file for them is from 2012.  Ov note needs to include please:  That she was seen today for either her asthma or bronchitis Specifically stating that she takes albuterol neb soln for her asthma or bronchitis.  Will forward to CY so that this can be taken care of - again pt will NOT receive her medication until this is taken care of.  The fax has been verified.

## 2011-08-26 NOTE — Telephone Encounter (Signed)
Note completed 

## 2011-08-26 NOTE — Assessment & Plan Note (Signed)
Current dry cough is unremarkable on exam. It may represent a post viral bronchitis in the absence of other explanations. Plan-chest x-ray, medication refills. Consider maintenance controller.

## 2011-08-26 NOTE — Telephone Encounter (Signed)
OV note still open.  Will forward msg to [facility] and Dr. Maple Hudson.

## 2011-08-26 NOTE — Assessment & Plan Note (Signed)
Okay to continue allergy vaccine 

## 2011-08-26 NOTE — Assessment & Plan Note (Signed)
We have discussed cyclical cough with interaction between postnasal drip, esophageal reflux and bronchospasm initiating reflexive coughing.

## 2011-08-26 NOTE — Assessment & Plan Note (Signed)
Good compliance and control. Pressure is appropriate. I encouraged weight loss.

## 2011-08-27 ENCOUNTER — Ambulatory Visit (INDEPENDENT_AMBULATORY_CARE_PROVIDER_SITE_OTHER): Payer: Medicare Other

## 2011-08-27 ENCOUNTER — Encounter: Payer: Self-pay | Admitting: Internal Medicine

## 2011-08-27 ENCOUNTER — Other Ambulatory Visit (INDEPENDENT_AMBULATORY_CARE_PROVIDER_SITE_OTHER): Payer: Medicare Other

## 2011-08-27 ENCOUNTER — Ambulatory Visit (INDEPENDENT_AMBULATORY_CARE_PROVIDER_SITE_OTHER): Payer: Medicare Other | Admitting: Internal Medicine

## 2011-08-27 VITALS — BP 110/70 | HR 80 | Temp 97.2°F | Resp 16 | Wt 237.0 lb

## 2011-08-27 DIAGNOSIS — E785 Hyperlipidemia, unspecified: Secondary | ICD-10-CM

## 2011-08-27 DIAGNOSIS — M545 Low back pain: Secondary | ICD-10-CM

## 2011-08-27 DIAGNOSIS — K219 Gastro-esophageal reflux disease without esophagitis: Secondary | ICD-10-CM

## 2011-08-27 DIAGNOSIS — I10 Essential (primary) hypertension: Secondary | ICD-10-CM | POA: Diagnosis not present

## 2011-08-27 DIAGNOSIS — E669 Obesity, unspecified: Secondary | ICD-10-CM

## 2011-08-27 DIAGNOSIS — Z23 Encounter for immunization: Secondary | ICD-10-CM | POA: Diagnosis not present

## 2011-08-27 DIAGNOSIS — G4733 Obstructive sleep apnea (adult) (pediatric): Secondary | ICD-10-CM

## 2011-08-27 DIAGNOSIS — J309 Allergic rhinitis, unspecified: Secondary | ICD-10-CM

## 2011-08-27 DIAGNOSIS — J45909 Unspecified asthma, uncomplicated: Secondary | ICD-10-CM

## 2011-08-27 LAB — CBC WITH DIFFERENTIAL/PLATELET
Basophils Absolute: 0.1 10*3/uL (ref 0.0–0.1)
Eosinophils Absolute: 0.2 10*3/uL (ref 0.0–0.7)
Hemoglobin: 10.9 g/dL — ABNORMAL LOW (ref 12.0–15.0)
Lymphocytes Relative: 30.7 % (ref 12.0–46.0)
Lymphs Abs: 2.2 10*3/uL (ref 0.7–4.0)
MCHC: 32 g/dL (ref 30.0–36.0)
Monocytes Absolute: 0.5 10*3/uL (ref 0.1–1.0)
Neutro Abs: 4.2 10*3/uL (ref 1.4–7.7)
RDW: 16.1 % — ABNORMAL HIGH (ref 11.5–14.6)

## 2011-08-27 LAB — HEPATIC FUNCTION PANEL
ALT: 19 U/L (ref 0–35)
Alkaline Phosphatase: 127 U/L — ABNORMAL HIGH (ref 39–117)
Bilirubin, Direct: 0.1 mg/dL (ref 0.0–0.3)
Total Protein: 7.3 g/dL (ref 6.0–8.3)

## 2011-08-27 LAB — URINALYSIS, ROUTINE W REFLEX MICROSCOPIC
Hgb urine dipstick: NEGATIVE
Ketones, ur: NEGATIVE
Total Protein, Urine: NEGATIVE
Urine Glucose: NEGATIVE
Urobilinogen, UA: 0.2 (ref 0.0–1.0)

## 2011-08-27 LAB — LIPID PANEL
Cholesterol: 151 mg/dL (ref 0–200)
Triglycerides: 64 mg/dL (ref 0.0–149.0)

## 2011-08-27 LAB — BASIC METABOLIC PANEL
Calcium: 9.4 mg/dL (ref 8.4–10.5)
Chloride: 104 mEq/L (ref 96–112)
Creatinine, Ser: 1 mg/dL (ref 0.4–1.2)
GFR: 71.38 mL/min (ref >60.00)

## 2011-08-27 MED ORDER — OMEPRAZOLE 20 MG PO CPDR: 20.0000 mg | DELAYED_RELEASE_CAPSULE | Freq: Every day | ORAL | Status: DC

## 2011-08-27 MED ORDER — VALSARTAN-HYDROCHLOROTHIAZIDE 160-25 MG PO TABS: 1.0000 | ORAL_TABLET | Freq: Every day | ORAL | Status: DC

## 2011-08-27 MED ORDER — MONTELUKAST SODIUM 10 MG PO TABS: 10.0000 mg | ORAL_TABLET | Freq: Every day | ORAL | Status: DC

## 2011-08-27 MED ORDER — POTASSIUM CHLORIDE ER 10 MEQ PO TBCR: 10.0000 meq | EXTENDED_RELEASE_TABLET | Freq: Every day | ORAL | Status: DC

## 2011-08-27 NOTE — Telephone Encounter (Signed)
OV notes have been faxed to number on file in phone note.

## 2011-08-27 NOTE — Assessment & Plan Note (Signed)
Continue with current prescription therapy as reflected on the Med list.  

## 2011-08-27 NOTE — Addendum Note (Signed)
Addended by: Merrilyn Puma on: 08/27/2011 10:13 AM   Modules accepted: Orders

## 2011-08-27 NOTE — Assessment & Plan Note (Signed)
On CPAP. ?

## 2011-08-27 NOTE — Progress Notes (Signed)
Subjective:    Patient ID: [name], female    DOB: [birth year], 61 y.o.   MRN: [id number]  Hypertension This is a chronic problem. The current episode started more than 1 year ago. The problem is unchanged. Pertinent negatives include no headaches, neck pain, palpitations or shortness of breath. There is no history of kidney disease.  The patient presents for a follow-up of  chronic GERD, asthma controlled with medicines  BP Readings from Last 3 Encounters:  08/27/11 110/70  08/23/11 108/70  09/25/10 110/80   Wt Readings from Last 3 Encounters:  08/27/11 237 lb (107.502 kg)  08/23/11 235 lb 3.2 oz (106.686 kg)  11/06/10 238 lb (107.956 kg)        Review of Systems  Constitutional: Negative.  Negative for fever, chills, diaphoresis, activity change, appetite change and unexpected weight change.  HENT: Negative for hearing loss, ear pain, nosebleeds, congestion, sore throat, facial swelling, rhinorrhea, sneezing, mouth sores, trouble swallowing, neck pain, neck stiffness, postnasal drip, sinus pressure and tinnitus.   Eyes: Negative for pain, discharge, redness, itching and visual disturbance.  Respiratory: Negative for cough, chest tightness, shortness of breath, wheezing and stridor.   Cardiovascular: Negative for palpitations and leg swelling.  Gastrointestinal: Negative for nausea, diarrhea, constipation, blood in stool, abdominal distention, anal bleeding and rectal pain.  Genitourinary: Negative for dysuria, urgency, frequency, hematuria, flank pain, vaginal bleeding, vaginal discharge, difficulty urinating, genital sores and pelvic pain.  Musculoskeletal: Negative for back pain, joint swelling, arthralgias and gait problem.       L arm in the biceps area as above  Skin: Negative.  Negative for rash.  Neurological: Negative for dizziness, tremors, seizures, syncope, speech difficulty, weakness and headaches.  Hematological: Negative for adenopathy. Does not  bruise/bleed easily.  Psychiatric/Behavioral: Negative for suicidal ideas, behavioral problems, disturbed wake/sleep cycle, dysphoric mood and decreased concentration. The patient is not nervous/anxious.        Objective:   Physical Exam  Constitutional: She appears well-developed. No distress.       Obese  HENT:  Head: Normocephalic.  Right Ear: External ear normal.  Left Ear: External ear normal.  Nose: Nose normal.  Mouth/Throat: Oropharynx is clear and moist.  Eyes: Conjunctivae are normal. Pupils are equal, round, and reactive to light. Right eye exhibits no discharge. Left eye exhibits no discharge.  Neck: Normal range of motion. Neck supple. No JVD present. No tracheal deviation present. No thyromegaly present.  Cardiovascular: Normal rate, regular rhythm and normal heart sounds.   Pulmonary/Chest: No stridor. No respiratory distress. She has no wheezes.  Abdominal: Soft. Bowel sounds are normal. She exhibits no distension and no mass. There is no tenderness. There is no rebound and no guarding.  Musculoskeletal: She exhibits no edema and no tenderness.  Lymphadenopathy:    She has no cervical adenopathy.  Neurological: She displays normal reflexes. No cranial nerve deficit. She exhibits normal muscle tone. Coordination normal.  Skin: No rash noted. No erythema.  Psychiatric: She has a normal mood and affect. Her behavior is normal. Judgment and thought content normal.   Lab Results  Component Value Date   WBC 6.6 03/27/2010   HGB 11.7* 03/27/2010   HCT 36.0 03/27/2010   PLT 289.0 03/27/2010   GLUCOSE 85 03/27/2010   CHOL 160 03/27/2010   TRIG 66.0 03/27/2010   HDL 82.20 03/27/2010   LDLCALC 65 03/27/2010   ALT 23 03/27/2010   AST 19 03/27/2010   NA 141 03/27/2010   K 3.6  03/27/2010   CL 103 03/27/2010   CREATININE 1.0 03/27/2010   BUN 11 03/27/2010   CO2 28 03/27/2010   TSH 1.64 03/27/2010   INR 1.0 ratio 09/07/2009   HGBA1C 6.2 09/07/2009         Assessment & Plan:

## 2011-08-27 NOTE — Assessment & Plan Note (Signed)
Better Continue with current prescription therapy as reflected on the Med list.  

## 2011-08-28 ENCOUNTER — Telehealth: Payer: Self-pay | Admitting: Internal Medicine

## 2011-08-28 NOTE — Telephone Encounter (Signed)
Offer benzonatate perles for dry cough  200 mg, # 40,  1 every 8 hours prn cough.

## 2011-08-28 NOTE — Telephone Encounter (Signed)
[name], please, inform patient that all labs are normal except for mild chronic anemia Cbc, tibc in 2 mo thx

## 2011-08-28 NOTE — Telephone Encounter (Signed)
Called left message to call back 

## 2011-08-29 ENCOUNTER — Telehealth: Payer: Self-pay | Admitting: Internal Medicine

## 2011-08-29 MED ORDER — HYDROCODONE-HOMATROPINE 5-1.5 MG/5ML PO SYRP: 5.0000 mL | ORAL_SOLUTION | Freq: Four times a day (QID) | ORAL | Status: DC | PRN

## 2011-08-29 MED ORDER — BENZONATATE 200 MG PO CAPS: 200.0000 mg | ORAL_CAPSULE | Freq: Three times a day (TID) | ORAL | Status: AC | PRN

## 2011-08-29 NOTE — Telephone Encounter (Signed)
Per CY-okay to Rx Hydromet #200 ml 1 tsp every 6 hours prn cough no refills.

## 2011-08-29 NOTE — Telephone Encounter (Signed)
I spoke with the pt and she states that the tessalon tablets are going to cost $65.69 and she cannot afford this. Pt wanted to know if there was anything cheaper. I advised that Medicare does not usually pay for cough syrups or tablets but that I will check with her pharmacy to see what the prices would be. [facility] Hydromet #132ml would be $24.29, which is the cheapest option for the pt. . Dr. Maple Hudson please advise if this is an ok alternative for the pt. Thanks.Carron Curie, CMA No Known Allergies

## 2011-08-29 NOTE — Telephone Encounter (Signed)
LMTCB

## 2011-08-29 NOTE — Telephone Encounter (Signed)
I spoke with the pt and she is aware rx was sent. Only was sent because that is the price the pt wants to pay. Carron Curie, CMA

## 2011-08-29 NOTE — Telephone Encounter (Signed)
Called, spoke with pt.  I informed her of below per Dr. Maple Hudson. She verbalized understanding of this, is aware rx sent to [facility] in [place], and was advised to call back if symptoms do not improve or worsen.  She verbalized understanding and voiced no further questions/concerns at this time.

## 2011-08-29 NOTE — Telephone Encounter (Signed)
Pt returned triage's call.  [name] ° °

## 2011-09-02 ENCOUNTER — Telehealth: Payer: Self-pay | Admitting: Internal Medicine

## 2011-09-02 NOTE — Telephone Encounter (Signed)
OV note from 08/23/11 refaxed to [facility] attn.  She is aware.

## 2011-09-05 NOTE — Telephone Encounter (Signed)
Pt called requesting result of lab. Left message on machine for pt to return call

## 2011-09-10 NOTE — Telephone Encounter (Signed)
No returned called from patient, closing phone note until she calls back

## 2011-09-19 ENCOUNTER — Ambulatory Visit: Payer: Medicare Other | Admitting: Internal Medicine

## 2011-10-01 ENCOUNTER — Ambulatory Visit (INDEPENDENT_AMBULATORY_CARE_PROVIDER_SITE_OTHER): Payer: Medicare Other

## 2011-10-01 DIAGNOSIS — J309 Allergic rhinitis, unspecified: Secondary | ICD-10-CM | POA: Diagnosis not present

## 2011-10-08 ENCOUNTER — Ambulatory Visit (INDEPENDENT_AMBULATORY_CARE_PROVIDER_SITE_OTHER): Payer: Medicare Other

## 2011-10-08 DIAGNOSIS — J309 Allergic rhinitis, unspecified: Secondary | ICD-10-CM

## 2011-11-01 ENCOUNTER — Ambulatory Visit (INDEPENDENT_AMBULATORY_CARE_PROVIDER_SITE_OTHER): Payer: Medicare Other

## 2011-11-01 DIAGNOSIS — Z23 Encounter for immunization: Secondary | ICD-10-CM

## 2011-11-01 DIAGNOSIS — J309 Allergic rhinitis, unspecified: Secondary | ICD-10-CM | POA: Diagnosis not present

## 2011-11-06 DIAGNOSIS — Z23 Encounter for immunization: Secondary | ICD-10-CM

## 2011-11-08 ENCOUNTER — Ambulatory Visit (INDEPENDENT_AMBULATORY_CARE_PROVIDER_SITE_OTHER): Payer: Medicare Other

## 2011-11-08 DIAGNOSIS — J309 Allergic rhinitis, unspecified: Secondary | ICD-10-CM | POA: Diagnosis not present

## 2011-11-29 ENCOUNTER — Ambulatory Visit (INDEPENDENT_AMBULATORY_CARE_PROVIDER_SITE_OTHER): Payer: Medicare Other

## 2011-11-29 DIAGNOSIS — J309 Allergic rhinitis, unspecified: Secondary | ICD-10-CM

## 2011-12-02 ENCOUNTER — Ambulatory Visit (INDEPENDENT_AMBULATORY_CARE_PROVIDER_SITE_OTHER): Payer: Medicare Other

## 2011-12-02 DIAGNOSIS — J309 Allergic rhinitis, unspecified: Secondary | ICD-10-CM | POA: Diagnosis not present

## 2011-12-03 ENCOUNTER — Ambulatory Visit (INDEPENDENT_AMBULATORY_CARE_PROVIDER_SITE_OTHER): Payer: Medicare Other

## 2011-12-03 DIAGNOSIS — J309 Allergic rhinitis, unspecified: Secondary | ICD-10-CM | POA: Diagnosis not present

## 2011-12-09 ENCOUNTER — Ambulatory Visit (INDEPENDENT_AMBULATORY_CARE_PROVIDER_SITE_OTHER): Payer: Medicare Other

## 2011-12-09 DIAGNOSIS — J309 Allergic rhinitis, unspecified: Secondary | ICD-10-CM

## 2011-12-17 ENCOUNTER — Ambulatory Visit: Payer: Medicare Other | Admitting: Internal Medicine

## 2011-12-20 ENCOUNTER — Ambulatory Visit (INDEPENDENT_AMBULATORY_CARE_PROVIDER_SITE_OTHER): Payer: Medicare Other

## 2011-12-20 DIAGNOSIS — J309 Allergic rhinitis, unspecified: Secondary | ICD-10-CM | POA: Diagnosis not present

## 2011-12-23 ENCOUNTER — Ambulatory Visit (INDEPENDENT_AMBULATORY_CARE_PROVIDER_SITE_OTHER): Payer: Medicare Other

## 2011-12-23 DIAGNOSIS — J309 Allergic rhinitis, unspecified: Secondary | ICD-10-CM | POA: Diagnosis not present

## 2011-12-24 ENCOUNTER — Telehealth: Payer: Self-pay | Admitting: *Deleted

## 2011-12-24 NOTE — Telephone Encounter (Signed)
Ok Klor Con M20 - same directions  Thx

## 2011-12-24 NOTE — Telephone Encounter (Signed)
Rec fax requesting her Klor con 10 meq ER to a cheaper alternative like: Klor Con M20 or Pot CL micro 10 meq ER. Please advise

## 2011-12-25 MED ORDER — POTASSIUM CHLORIDE CRYS ER 20 MEQ PO TBCR: 20.0000 meq | EXTENDED_RELEASE_TABLET | Freq: Every day | ORAL | Status: DC

## 2011-12-25 NOTE — Telephone Encounter (Signed)
Done

## 2011-12-27 ENCOUNTER — Encounter: Payer: Self-pay | Admitting: Internal Medicine

## 2012-01-01 ENCOUNTER — Ambulatory Visit (INDEPENDENT_AMBULATORY_CARE_PROVIDER_SITE_OTHER): Payer: Medicare Other

## 2012-01-01 DIAGNOSIS — J309 Allergic rhinitis, unspecified: Secondary | ICD-10-CM | POA: Diagnosis not present

## 2012-01-16 ENCOUNTER — Ambulatory Visit (INDEPENDENT_AMBULATORY_CARE_PROVIDER_SITE_OTHER): Payer: Medicare Other

## 2012-01-16 DIAGNOSIS — J309 Allergic rhinitis, unspecified: Secondary | ICD-10-CM

## 2012-01-20 ENCOUNTER — Ambulatory Visit (INDEPENDENT_AMBULATORY_CARE_PROVIDER_SITE_OTHER): Payer: Medicare Other

## 2012-01-20 DIAGNOSIS — J309 Allergic rhinitis, unspecified: Secondary | ICD-10-CM

## 2012-01-30 ENCOUNTER — Ambulatory Visit (INDEPENDENT_AMBULATORY_CARE_PROVIDER_SITE_OTHER): Payer: Medicare Other

## 2012-01-30 DIAGNOSIS — J309 Allergic rhinitis, unspecified: Secondary | ICD-10-CM

## 2012-02-04 ENCOUNTER — Ambulatory Visit (INDEPENDENT_AMBULATORY_CARE_PROVIDER_SITE_OTHER): Payer: Medicare Other

## 2012-02-04 DIAGNOSIS — J309 Allergic rhinitis, unspecified: Secondary | ICD-10-CM

## 2012-02-10 ENCOUNTER — Ambulatory Visit (INDEPENDENT_AMBULATORY_CARE_PROVIDER_SITE_OTHER): Payer: Medicare Other

## 2012-02-10 DIAGNOSIS — J309 Allergic rhinitis, unspecified: Secondary | ICD-10-CM | POA: Diagnosis not present

## 2012-02-17 ENCOUNTER — Ambulatory Visit (INDEPENDENT_AMBULATORY_CARE_PROVIDER_SITE_OTHER): Payer: Medicare Other

## 2012-02-17 DIAGNOSIS — J309 Allergic rhinitis, unspecified: Secondary | ICD-10-CM | POA: Diagnosis not present

## 2012-02-24 ENCOUNTER — Ambulatory Visit (INDEPENDENT_AMBULATORY_CARE_PROVIDER_SITE_OTHER): Payer: Medicare Other

## 2012-02-24 ENCOUNTER — Ambulatory Visit: Payer: Medicare Other

## 2012-02-24 DIAGNOSIS — J309 Allergic rhinitis, unspecified: Secondary | ICD-10-CM | POA: Diagnosis not present

## 2012-03-02 ENCOUNTER — Ambulatory Visit (INDEPENDENT_AMBULATORY_CARE_PROVIDER_SITE_OTHER): Payer: Medicare Other

## 2012-03-02 ENCOUNTER — Ambulatory Visit: Payer: Medicare Other

## 2012-03-02 DIAGNOSIS — J309 Allergic rhinitis, unspecified: Secondary | ICD-10-CM | POA: Diagnosis not present

## 2012-03-09 ENCOUNTER — Ambulatory Visit: Payer: Medicare Other

## 2012-03-09 ENCOUNTER — Ambulatory Visit (INDEPENDENT_AMBULATORY_CARE_PROVIDER_SITE_OTHER): Payer: Medicare Other

## 2012-03-09 DIAGNOSIS — J309 Allergic rhinitis, unspecified: Secondary | ICD-10-CM | POA: Diagnosis not present

## 2012-03-16 ENCOUNTER — Telehealth: Payer: Self-pay | Admitting: Internal Medicine

## 2012-03-16 ENCOUNTER — Ambulatory Visit (INDEPENDENT_AMBULATORY_CARE_PROVIDER_SITE_OTHER): Payer: Medicare Other

## 2012-03-16 ENCOUNTER — Ambulatory Visit: Payer: Medicare Other

## 2012-03-16 DIAGNOSIS — J309 Allergic rhinitis, unspecified: Secondary | ICD-10-CM | POA: Diagnosis not present

## 2012-03-16 MED ORDER — DOXYCYCLINE HYCLATE 100 MG PO TABS: ORAL_TABLET | ORAL | Status: DC

## 2012-03-16 NOTE — Telephone Encounter (Signed)
Called, spoke with pt.  I explained to her the doxy rx instructions on how to take this medication.  She verbalized understanding and states this was all she needed.  She is [facility] and to call back if symptoms do not improve or worsen.  She verbalized understanding and voiced no further questions or concerns at this time.

## 2012-03-16 NOTE — Telephone Encounter (Signed)
[facility] [place].    lmomtcb to inform pt.

## 2012-03-16 NOTE — Telephone Encounter (Signed)
Last OV 07/2011 and pending OV 07/2012.  Pt c/o severe cough with yellow mucus since Sat., 03/14/12. Pt also c/o wheezing, sore throat, head congestion. Pt states the cough is worse at night and she has been unable to sleep. She was not sure of any fever. CY, pls advise.No Known Allergies

## 2012-03-16 NOTE — Telephone Encounter (Signed)
Per CY-Doxycycline 100 mg #8 take 2 today then 1 daily no refills.

## 2012-03-16 NOTE — Telephone Encounter (Signed)
Pt returned call.  Advised pt that Rx for doxy was sent to her pharmacy.  Pt verbalized understanding, but would like to discuss the med w/ a nurse.  Antionette Fairy

## 2012-03-23 ENCOUNTER — Ambulatory Visit (INDEPENDENT_AMBULATORY_CARE_PROVIDER_SITE_OTHER): Payer: Medicare Other

## 2012-03-23 ENCOUNTER — Ambulatory Visit: Payer: Medicare Other

## 2012-03-23 DIAGNOSIS — J309 Allergic rhinitis, unspecified: Secondary | ICD-10-CM

## 2012-03-28 LAB — HM MAMMOGRAPHY

## 2012-03-30 ENCOUNTER — Ambulatory Visit: Payer: Medicare Other

## 2012-04-01 ENCOUNTER — Ambulatory Visit (INDEPENDENT_AMBULATORY_CARE_PROVIDER_SITE_OTHER): Payer: Medicare Other

## 2012-04-01 DIAGNOSIS — J309 Allergic rhinitis, unspecified: Secondary | ICD-10-CM

## 2012-04-03 ENCOUNTER — Ambulatory Visit (INDEPENDENT_AMBULATORY_CARE_PROVIDER_SITE_OTHER): Payer: Medicare Other

## 2012-04-03 DIAGNOSIS — J309 Allergic rhinitis, unspecified: Secondary | ICD-10-CM | POA: Diagnosis not present

## 2012-04-06 ENCOUNTER — Ambulatory Visit (INDEPENDENT_AMBULATORY_CARE_PROVIDER_SITE_OTHER): Payer: Medicare Other

## 2012-04-06 DIAGNOSIS — J309 Allergic rhinitis, unspecified: Secondary | ICD-10-CM | POA: Diagnosis not present

## 2012-04-13 ENCOUNTER — Ambulatory Visit: Payer: Medicare Other

## 2012-04-20 ENCOUNTER — Ambulatory Visit (INDEPENDENT_AMBULATORY_CARE_PROVIDER_SITE_OTHER): Payer: Medicare Other

## 2012-04-20 DIAGNOSIS — J309 Allergic rhinitis, unspecified: Secondary | ICD-10-CM | POA: Diagnosis not present

## 2012-04-27 ENCOUNTER — Ambulatory Visit (INDEPENDENT_AMBULATORY_CARE_PROVIDER_SITE_OTHER): Payer: Medicare Other

## 2012-04-27 DIAGNOSIS — J309 Allergic rhinitis, unspecified: Secondary | ICD-10-CM

## 2012-05-04 ENCOUNTER — Telehealth: Payer: Self-pay | Admitting: Internal Medicine

## 2012-05-04 ENCOUNTER — Ambulatory Visit (INDEPENDENT_AMBULATORY_CARE_PROVIDER_SITE_OTHER): Payer: Medicare Other

## 2012-05-04 DIAGNOSIS — J309 Allergic rhinitis, unspecified: Secondary | ICD-10-CM

## 2012-05-04 MED ORDER — AMOXICILLIN 500 MG PO TABS: 500.0000 mg | ORAL_TABLET | Freq: Three times a day (TID) | ORAL | Status: DC

## 2012-05-04 NOTE — Telephone Encounter (Signed)
Spoke with pt and notified of recs per CDY She verbalized understanding and states nothing further needed  Rx was sent to pharm

## 2012-05-04 NOTE — Telephone Encounter (Signed)
Per CY-give Amoxicillin 500 mg #21 take 1 po TID x 7 days no refills.

## 2012-05-04 NOTE — Telephone Encounter (Signed)
Last OV 08/23/11, next ov 08/24/12. Pt is c/o sinus congestion, pain, and pressure, sore throat, and a cough that is mostly dry but on occasion she will cough up yellow phlegm all x 3 days. Pt has not tried anything OTC because she states they never work for her. Please advise.[name], CMA No Known Allergies

## 2012-05-13 ENCOUNTER — Ambulatory Visit (INDEPENDENT_AMBULATORY_CARE_PROVIDER_SITE_OTHER): Payer: Medicare Other

## 2012-05-13 DIAGNOSIS — J309 Allergic rhinitis, unspecified: Secondary | ICD-10-CM

## 2012-05-18 ENCOUNTER — Ambulatory Visit (INDEPENDENT_AMBULATORY_CARE_PROVIDER_SITE_OTHER): Payer: Medicare Other

## 2012-05-18 DIAGNOSIS — J309 Allergic rhinitis, unspecified: Secondary | ICD-10-CM | POA: Diagnosis not present

## 2012-05-25 ENCOUNTER — Ambulatory Visit: Payer: Medicare Other

## 2012-05-29 ENCOUNTER — Ambulatory Visit (INDEPENDENT_AMBULATORY_CARE_PROVIDER_SITE_OTHER): Payer: Medicare Other

## 2012-05-29 DIAGNOSIS — J309 Allergic rhinitis, unspecified: Secondary | ICD-10-CM | POA: Diagnosis not present

## 2012-06-01 ENCOUNTER — Ambulatory Visit (INDEPENDENT_AMBULATORY_CARE_PROVIDER_SITE_OTHER): Payer: Medicare Other

## 2012-06-01 DIAGNOSIS — J309 Allergic rhinitis, unspecified: Secondary | ICD-10-CM

## 2012-06-08 ENCOUNTER — Ambulatory Visit (INDEPENDENT_AMBULATORY_CARE_PROVIDER_SITE_OTHER): Payer: Medicare Other

## 2012-06-08 DIAGNOSIS — J309 Allergic rhinitis, unspecified: Secondary | ICD-10-CM

## 2012-06-10 ENCOUNTER — Other Ambulatory Visit: Payer: Self-pay | Admitting: Internal Medicine

## 2012-06-12 ENCOUNTER — Telehealth: Payer: Self-pay | Admitting: Internal Medicine

## 2012-06-12 ENCOUNTER — Encounter: Payer: Self-pay | Admitting: Internal Medicine

## 2012-06-12 MED ORDER — PROMETHAZINE-CODEINE 6.25-10 MG/5ML PO SYRP: 5.0000 mL | ORAL_SOLUTION | Freq: Four times a day (QID) | ORAL | Status: DC | PRN

## 2012-06-12 NOTE — Telephone Encounter (Signed)
Please advise if okay to refill. Thanks.  

## 2012-06-12 NOTE — Telephone Encounter (Signed)
Ok to refill 

## 2012-06-12 NOTE — Telephone Encounter (Signed)
Pt aware rx called in nothing further needed.

## 2012-06-12 NOTE — Telephone Encounter (Signed)
Per CY-give Promethazine codeine #23ml 1 tsp every 6 hours prn cough no refills.

## 2012-06-12 NOTE — Telephone Encounter (Signed)
Spoke with pt .states having a dry hacky cough all day/night,unalbe to get any rest. Also c/o runny nose,and some wheezing.  Pt requesting rx for some cough syrup. No Known Allergies  Dr Maple Hudson please advise Thank you .

## 2012-06-15 ENCOUNTER — Ambulatory Visit (INDEPENDENT_AMBULATORY_CARE_PROVIDER_SITE_OTHER): Payer: Medicare Other

## 2012-06-15 DIAGNOSIS — J309 Allergic rhinitis, unspecified: Secondary | ICD-10-CM

## 2012-06-16 NOTE — Telephone Encounter (Signed)
Rx has been called in per CY. 

## 2012-06-24 ENCOUNTER — Other Ambulatory Visit: Payer: Self-pay | Admitting: Internal Medicine

## 2012-06-29 ENCOUNTER — Other Ambulatory Visit: Payer: Self-pay | Admitting: Internal Medicine

## 2012-07-01 ENCOUNTER — Other Ambulatory Visit: Payer: Self-pay | Admitting: Internal Medicine

## 2012-07-02 ENCOUNTER — Telehealth: Payer: Self-pay | Admitting: *Deleted

## 2012-07-02 ENCOUNTER — Ambulatory Visit (INDEPENDENT_AMBULATORY_CARE_PROVIDER_SITE_OTHER): Payer: Medicare Other

## 2012-07-02 DIAGNOSIS — J309 Allergic rhinitis, unspecified: Secondary | ICD-10-CM

## 2012-07-02 NOTE — Telephone Encounter (Signed)
10 meq po qd Needs OV Thx

## 2012-07-02 NOTE — Telephone Encounter (Signed)
Pharmacy informed. Will have [facility] schedulers contact pt to schedule OV.

## 2012-07-02 NOTE — Telephone Encounter (Signed)
Rec call from pharmacy needing clarification on Klor Con sig. It looks like the dose was changed at 07/2011 OV. Please advise what strength and sig is correct?

## 2012-07-06 ENCOUNTER — Ambulatory Visit: Payer: Medicare Other

## 2012-07-09 ENCOUNTER — Telehealth: Payer: Self-pay | Admitting: *Deleted

## 2012-07-09 DIAGNOSIS — Z0000 Encounter for general adult medical examination without abnormal findings: Secondary | ICD-10-CM

## 2012-07-09 NOTE — Telephone Encounter (Signed)
Pt has Medicare and can not have labs done prior to a CPE.

## 2012-07-09 NOTE — Telephone Encounter (Signed)
Message copied by Merrilyn Puma on Thu Jul 09, 2012  1:51 PM ------      Message from: Livingston Diones      Created: Thu Jul 02, 2012  2:40 PM       She has an appt with [facility] for CPE 07/14/12.       ----- Message -----         From: Merrilyn Puma, CMA         Sent: 07/02/2012   2:19 PM           To: Etheleen Sia, Phetcharat Noitamyae            Please call pt- needs OV per PCP. I refilled her Rx for a 90 day supply with no refills.             Thanks.        ------

## 2012-07-10 ENCOUNTER — Ambulatory Visit (INDEPENDENT_AMBULATORY_CARE_PROVIDER_SITE_OTHER): Payer: Medicare Other

## 2012-07-10 DIAGNOSIS — J309 Allergic rhinitis, unspecified: Secondary | ICD-10-CM | POA: Diagnosis not present

## 2012-07-14 ENCOUNTER — Encounter: Payer: Self-pay | Admitting: Internal Medicine

## 2012-07-14 ENCOUNTER — Ambulatory Visit (INDEPENDENT_AMBULATORY_CARE_PROVIDER_SITE_OTHER): Payer: Medicare Other

## 2012-07-14 ENCOUNTER — Telehealth: Payer: Self-pay | Admitting: Internal Medicine

## 2012-07-14 ENCOUNTER — Ambulatory Visit (INDEPENDENT_AMBULATORY_CARE_PROVIDER_SITE_OTHER): Payer: Medicare Other | Admitting: Internal Medicine

## 2012-07-14 VITALS — BP 112/82 | HR 80 | Temp 97.8°F | Resp 16 | Ht 62.0 in | Wt 242.0 lb

## 2012-07-14 DIAGNOSIS — Z0000 Encounter for general adult medical examination without abnormal findings: Secondary | ICD-10-CM

## 2012-07-14 DIAGNOSIS — M545 Low back pain: Secondary | ICD-10-CM

## 2012-07-14 DIAGNOSIS — J301 Allergic rhinitis due to pollen: Secondary | ICD-10-CM

## 2012-07-14 DIAGNOSIS — J309 Allergic rhinitis, unspecified: Secondary | ICD-10-CM

## 2012-07-14 DIAGNOSIS — E669 Obesity, unspecified: Secondary | ICD-10-CM

## 2012-07-14 DIAGNOSIS — K219 Gastro-esophageal reflux disease without esophagitis: Secondary | ICD-10-CM | POA: Diagnosis not present

## 2012-07-14 DIAGNOSIS — I10 Essential (primary) hypertension: Secondary | ICD-10-CM

## 2012-07-14 DIAGNOSIS — J45909 Unspecified asthma, uncomplicated: Secondary | ICD-10-CM

## 2012-07-14 LAB — URINALYSIS, ROUTINE W REFLEX MICROSCOPIC
Bilirubin Urine: NEGATIVE
Hgb urine dipstick: NEGATIVE
Nitrite: NEGATIVE

## 2012-07-14 LAB — CBC WITH DIFFERENTIAL/PLATELET
Eosinophils Relative: 3.1 % (ref 0.0–5.0)
HCT: 32.3 % — ABNORMAL LOW (ref 36.0–46.0)
Hemoglobin: 10.5 g/dL — ABNORMAL LOW (ref 12.0–15.0)
Lymphs Abs: 1.8 10*3/uL (ref 0.7–4.0)
MCV: 68.3 fl — ABNORMAL LOW (ref 78.0–100.0)
Monocytes Absolute: 0.7 10*3/uL (ref 0.1–1.0)
Neutro Abs: 4.4 10*3/uL (ref 1.4–7.7)
Platelets: 308 10*3/uL (ref 150.0–400.0)
WBC: 7.1 10*3/uL (ref 4.5–10.5)

## 2012-07-14 MED ORDER — FLUTICASONE-SALMETEROL 250-50 MCG/DOSE IN AEPB: INHALATION_SPRAY | RESPIRATORY_TRACT | Status: DC

## 2012-07-14 MED ORDER — VALSARTAN-HYDROCHLOROTHIAZIDE 160-25 MG PO TABS: 1.0000 | ORAL_TABLET | Freq: Every day | ORAL | Status: DC

## 2012-07-14 MED ORDER — MOMETASONE FUROATE 50 MCG/ACT NA SUSP: 2.0000 | Freq: Every day | NASAL | Status: DC | PRN

## 2012-07-14 MED ORDER — PROMETHAZINE-CODEINE 6.25-10 MG/5ML PO SYRP: 5.0000 mL | ORAL_SOLUTION | Freq: Four times a day (QID) | ORAL | Status: DC | PRN

## 2012-07-14 MED ORDER — OMEPRAZOLE 20 MG PO CPDR: 20.0000 mg | DELAYED_RELEASE_CAPSULE | Freq: Every day | ORAL | Status: DC

## 2012-07-14 MED ORDER — HYDROCODONE-ACETAMINOPHEN 7.5-325 MG PO TABS: 1.0000 | ORAL_TABLET | Freq: Four times a day (QID) | ORAL | Status: DC | PRN

## 2012-07-14 MED ORDER — ALBUTEROL SULFATE HFA 108 (90 BASE) MCG/ACT IN AERS: 2.0000 | INHALATION_SPRAY | Freq: Four times a day (QID) | RESPIRATORY_TRACT | Status: DC | PRN

## 2012-07-14 MED ORDER — MONTELUKAST SODIUM 10 MG PO TABS: 10.0000 mg | ORAL_TABLET | Freq: Every day | ORAL | Status: DC

## 2012-07-14 NOTE — Patient Instructions (Signed)
Try Gluten free diet x 6 weeks

## 2012-07-14 NOTE — Assessment & Plan Note (Signed)

## 2012-07-14 NOTE — Assessment & Plan Note (Signed)
Continue with current prescription therapy as reflected on the Med list.  

## 2012-07-14 NOTE — Telephone Encounter (Signed)
Klor con was Rf on 07/01/12. Pt informed

## 2012-07-14 NOTE — Assessment & Plan Note (Signed)
Better  

## 2012-07-14 NOTE — Telephone Encounter (Signed)
Pt called req refill for Klor Con [facility] for 90 day supply. Please advise. Pt stated Dr. Macario Golds forgot to give to pt.

## 2012-07-14 NOTE — Assessment & Plan Note (Signed)
Wt Readings from Last 3 Encounters:  07/14/12 242 lb (109.77 kg)  08/27/11 237 lb (107.502 kg)  08/23/11 235 lb 3.2 oz (106.686 kg)

## 2012-07-14 NOTE — Progress Notes (Signed)
Subjective:   The patient is here for a wellness exam. The patient has been doing well overall without major physical or psychological issues going on lately. C/o R knee pain - may need a TKR per Dr August Saucer   Hypertension This is a chronic problem. The current episode started more than 1 year ago. The problem is unchanged. Pertinent negatives include no headaches, neck pain, palpitations or shortness of breath. There is no history of kidney disease.   The patient presents for a follow-up of  chronic GERD, asthma controlled with medicines  BP Readings from Last 3 Encounters:  07/14/12 112/82  08/27/11 110/70  08/23/11 108/70   Wt Readings from Last 3 Encounters:  07/14/12 242 lb (109.77 kg)  08/27/11 237 lb (107.502 kg)  08/23/11 235 lb 3.2 oz (106.686 kg)        Review of Systems  Constitutional: Negative.  Negative for fever, chills, diaphoresis, activity change, appetite change and unexpected weight change.  HENT: Negative for hearing loss, ear pain, nosebleeds, congestion, sore throat, facial swelling, rhinorrhea, sneezing, mouth sores, trouble swallowing, neck pain, neck stiffness, postnasal drip, sinus pressure and tinnitus.   Eyes: Negative for pain, discharge, redness, itching and visual disturbance.  Respiratory: Negative for cough, chest tightness, shortness of breath, wheezing and stridor.   Cardiovascular: Negative for palpitations and leg swelling.  Gastrointestinal: Negative for nausea, diarrhea, constipation, blood in stool, abdominal distention, anal bleeding and rectal pain.  Genitourinary: Negative for dysuria, urgency, frequency, hematuria, flank pain, vaginal bleeding, vaginal discharge, difficulty urinating, genital sores and pelvic pain.  Musculoskeletal: Negative for back pain, joint swelling, arthralgias and gait problem.       L arm in the biceps area as above  Skin: Negative.  Negative for rash.  Neurological: Negative for dizziness, tremors, seizures,  syncope, speech difficulty, weakness and headaches.  Hematological: Negative for adenopathy. Does not bruise/bleed easily.  Psychiatric/Behavioral: Negative for suicidal ideas, behavioral problems, sleep disturbance, dysphoric mood and decreased concentration. The patient is not nervous/anxious.        Objective:   Physical Exam  Constitutional: She appears well-developed. No distress.  Obese  HENT:  Head: Normocephalic.  Right Ear: External ear normal.  Left Ear: External ear normal.  Nose: Nose normal.  Mouth/Throat: Oropharynx is clear and moist.  Eyes: Conjunctivae are normal. Pupils are equal, round, and reactive to light. Right eye exhibits no discharge. Left eye exhibits no discharge.  Neck: Normal range of motion. Neck supple. No JVD present. No tracheal deviation present. No thyromegaly present.  Cardiovascular: Normal rate, regular rhythm and normal heart sounds.   Pulmonary/Chest: No stridor. No respiratory distress. She has no wheezes.  Abdominal: Soft. Bowel sounds are normal. She exhibits no distension and no mass. There is no tenderness. There is no rebound and no guarding.  Musculoskeletal: She exhibits no edema and no tenderness.  Lymphadenopathy:    She has no cervical adenopathy.  Neurological: She displays normal reflexes. No cranial nerve deficit. She exhibits normal muscle tone. Coordination normal.  Skin: No rash noted. No erythema.  Psychiatric: She has a normal mood and affect. Her behavior is normal. Judgment and thought content normal.   Lab Results  Component Value Date   WBC 7.3 08/27/2011   HGB 10.9* 08/27/2011   HCT 34.2* 08/27/2011   PLT 284.0 08/27/2011   GLUCOSE 103* 08/27/2011   CHOL 151 08/27/2011   TRIG 64.0 08/27/2011   HDL 79.20 08/27/2011   LDLCALC 59 08/27/2011   ALT 19  08/27/2011   AST 16 08/27/2011   NA 142 08/27/2011   K 4.1 08/27/2011   CL 104 08/27/2011   CREATININE 1.0 08/27/2011   BUN 20 08/27/2011   CO2 30 08/27/2011   TSH 1.25 08/27/2011    INR 1.0 ratio 09/07/2009   HGBA1C 6.2 09/07/2009         Assessment & Plan:

## 2012-07-15 LAB — HEPATIC FUNCTION PANEL
ALT: 18 U/L (ref 0–35)
AST: 19 U/L (ref 0–37)
Albumin: 3.2 g/dL — ABNORMAL LOW (ref 3.5–5.2)
Total Protein: 7.1 g/dL (ref 6.0–8.3)

## 2012-07-15 LAB — LIPID PANEL
HDL: 76.6 mg/dL (ref >39.00)
LDL Cholesterol: 56 mg/dL (ref 0–99)
Total CHOL/HDL Ratio: 2
VLDL: 11.8 mg/dL (ref 0.0–40.0)

## 2012-07-15 LAB — BASIC METABOLIC PANEL
BUN: 16 mg/dL (ref 6–23)
Chloride: 106 mEq/L (ref 96–112)
Glucose, Bld: 93 mg/dL (ref 70–99)
Potassium: 3.8 mEq/L (ref 3.5–5.1)
Sodium: 142 mEq/L (ref 135–145)

## 2012-07-20 ENCOUNTER — Ambulatory Visit: Payer: Medicare Other

## 2012-07-28 ENCOUNTER — Ambulatory Visit (INDEPENDENT_AMBULATORY_CARE_PROVIDER_SITE_OTHER): Payer: Medicare Other

## 2012-07-28 DIAGNOSIS — J309 Allergic rhinitis, unspecified: Secondary | ICD-10-CM

## 2012-08-03 ENCOUNTER — Ambulatory Visit (INDEPENDENT_AMBULATORY_CARE_PROVIDER_SITE_OTHER): Payer: Medicare Other

## 2012-08-03 DIAGNOSIS — J309 Allergic rhinitis, unspecified: Secondary | ICD-10-CM | POA: Diagnosis not present

## 2012-08-17 ENCOUNTER — Ambulatory Visit (INDEPENDENT_AMBULATORY_CARE_PROVIDER_SITE_OTHER): Payer: Medicare Other

## 2012-08-17 DIAGNOSIS — J309 Allergic rhinitis, unspecified: Secondary | ICD-10-CM | POA: Diagnosis not present

## 2012-08-24 ENCOUNTER — Encounter: Payer: Self-pay | Admitting: Internal Medicine

## 2012-08-24 ENCOUNTER — Telehealth: Payer: Self-pay | Admitting: Internal Medicine

## 2012-08-24 ENCOUNTER — Ambulatory Visit (INDEPENDENT_AMBULATORY_CARE_PROVIDER_SITE_OTHER): Payer: Medicare Other

## 2012-08-24 ENCOUNTER — Ambulatory Visit (INDEPENDENT_AMBULATORY_CARE_PROVIDER_SITE_OTHER)
Admission: RE | Admit: 2012-08-24 | Discharge: 2012-08-24 | Disposition: A | Discharge status: Home / Self Care | Payer: Medicare Other | Source: Ambulatory Visit | Attending: Internal Medicine | Admitting: Internal Medicine

## 2012-08-24 ENCOUNTER — Ambulatory Visit (INDEPENDENT_AMBULATORY_CARE_PROVIDER_SITE_OTHER): Payer: Medicare Other | Admitting: Internal Medicine

## 2012-08-24 VITALS — BP 120/78 | HR 83 | Ht 62.0 in | Wt 242.4 lb

## 2012-08-24 DIAGNOSIS — J309 Allergic rhinitis, unspecified: Secondary | ICD-10-CM | POA: Diagnosis not present

## 2012-08-24 DIAGNOSIS — G4733 Obstructive sleep apnea (adult) (pediatric): Secondary | ICD-10-CM

## 2012-08-24 DIAGNOSIS — J301 Allergic rhinitis due to pollen: Secondary | ICD-10-CM

## 2012-08-24 DIAGNOSIS — R05 Cough: Secondary | ICD-10-CM | POA: Diagnosis not present

## 2012-08-24 DIAGNOSIS — J42 Unspecified chronic bronchitis: Secondary | ICD-10-CM | POA: Diagnosis not present

## 2012-08-24 DIAGNOSIS — E669 Obesity, unspecified: Secondary | ICD-10-CM | POA: Diagnosis not present

## 2012-08-24 DIAGNOSIS — K219 Gastro-esophageal reflux disease without esophagitis: Secondary | ICD-10-CM | POA: Diagnosis not present

## 2012-08-24 MED ORDER — PROMETHAZINE-CODEINE 6.25-10 MG/5ML PO SYRP: 5.0000 mL | ORAL_SOLUTION | Freq: Four times a day (QID) | ORAL | Status: DC | PRN

## 2012-08-24 NOTE — Telephone Encounter (Signed)
Spoke with patient after her OV today to inform of cxr results Results given to patient and patient now wanted to know if Dr. Maple Hudson was going to call her in a RX Patient c/o dry cough at times with a little yellow phlegm Dr. Maple Hudson please advise, thank you

## 2012-08-24 NOTE — Progress Notes (Signed)
Quick Note:  Spoke with patient, patient verbalized understanding of results. See telephone note for further.  ______

## 2012-08-24 NOTE — Telephone Encounter (Signed)
Per CY- promethazine-codeine cough syrup #200 0 refills-- take 1tsp every 6 hrs PRN cough  Spoke with patient, made her aware that Rx was to be called in to verified pharmacy Patient verbalized understanding and nothing further needed at this time

## 2012-08-24 NOTE — Progress Notes (Signed)
Subjective:    Patient ID: [name], female    DOB: 1950/09/02, 62 y.o.   MRN: [id number]  HPI 08/07/10- 60 yoF never smoker followed for asthma, allergic rhinitis,, OSA, obesity/hypoventilation, complicated by GERD, arthritis, HBP Last here January 31, 2010 CPAP still used every night, works well at [employer]. Allergy vaccine at 1;10, still seems to have helped.  Recent hot weather has been hard on her- coughing more, wheezing more, interfering with sleep. Sputum trace yellow. Had temp 101, no sore throat. Some nausea and vomiting  No chest pain.    08/23/11-  61 yoF never smoker followed for asthma, allergic rhinitis,, OSA, obesity/hypoventilation, complicated by GERD, arthritis, HBP Patient states a little better since last visit. c/o dry cough x 1 week.  Denies sob, wheezing, chest pain, and chest tightness.  She likes humid weather that we have had this year better than very dry weather. She reports doing well with her allergy vaccine at 1:10 GH. Using rescue inhaler or nebulizer about once daily. She depends on her nebulizer/ albuterol neb solution especially if she's a little tighter. We discussed appropriate use. Walking regularly 2-3 times per week. She reports good compliance and control with her CPAP 9/ [facility].  08/24/12- 61 yoF never smoker followed for asthma, allergic rhinitis,, OSA, obesity/hypoventilation, complicated by GERD, arthritis, HBP FOLLOWS FOR: pt reports breathing about the same since last visit-- having a little dry ocugh d/t hot weather-- denies any other concerns at this time  Allergy vaccine 1:10 GH OSA-CPAP 9/ [facility]. She says she uses it all night every night. Minor dry cough. She is aware of mild reflux but no major events.  ROS-see HPI Constitutional:   No-   weight loss, night sweats, fevers, chills, fatigue, lassitude. HEENT:   No-  headaches, difficulty swallowing, tooth/dental problems, sore throat,       No-  sneezing, itching, ear  ache, nasal congestion, post nasal drip,  CV:  No-   chest pain, orthopnea, PND, swelling in lower extremities, anasarca, dizziness, palpitations Resp: No- acute  shortness of breath with exertion or at rest.              No-   productive cough,  +non-productive cough,  No- coughing up of blood.              No-   change in color of mucus.  No-sustained wheezing.   Skin: No-   rash or lesions. GI:  + heartburn, indigestion, no-abdominal pain, nausea, vomiting,  GU: No  complaint MS:  No-   joint pain or swelling.   Neuro-     nothing unusual Psych:  No- change in mood or affect. No depression or anxiety.  No memory loss.  OBJ- Physical Exam General- Alert, Oriented, Affect-appropriate, Distress- none acute, obese Skin- rash-none, lesions- none, excoriation- none Lymphadenopathy- none Head- atraumatic            Eyes- Gross vision intact, PERRLA, conjunctivae and secretions clear            Ears- Hearing, canals-normal            Nose- Clear, no-Septal dev, mucus, polyps, erosion, perforation             Throat- Mallampati II , mucosa clear , drainage- none, tonsils- atrophic Neck- flexible , trachea midline, no stridor , thyroid nl, carotid no bruit Chest - symmetrical excursion , unlabored           Heart/CV- RRR ,  no murmur , no gallop  , no rub, nl s1 s2                           - JVD- none , edema- none, stasis changes- none, varices- none           Lung- clear to P&A, wheeze- none, + mild dry cough , dullness-none, rub- none           Chest wall-  Abd-  Br/ Gen/ Rectal- Not done, not indicated Extrem- cyanosis- none, clubbing, none, atrophy- none, strength- nl Neuro- grossly intact to observation

## 2012-08-24 NOTE — Patient Instructions (Addendum)
Order- CXR  Dx Asthma, cough  We can continue allergy vaccine 1:10 GH  We can continue CPAP 9/ [facility]  Please call as needed

## 2012-08-31 ENCOUNTER — Ambulatory Visit: Payer: Medicare Other

## 2012-09-02 ENCOUNTER — Ambulatory Visit (INDEPENDENT_AMBULATORY_CARE_PROVIDER_SITE_OTHER): Payer: Medicare Other

## 2012-09-02 DIAGNOSIS — J309 Allergic rhinitis, unspecified: Secondary | ICD-10-CM

## 2012-09-05 NOTE — Assessment & Plan Note (Signed)
Aware of mild reflux recently. This may contribute to dry cough. Plan-emphasis on reflux precautions.

## 2012-09-05 NOTE — Assessment & Plan Note (Signed)
Weight loss is encouraged 

## 2012-09-05 NOTE — Assessment & Plan Note (Signed)
Plan-continue allergy vaccine. Seasonal antihistamines as needed.

## 2012-09-05 NOTE — Assessment & Plan Note (Signed)
Good compliance and control. Weight loss and encouraged.

## 2012-09-05 NOTE — Assessment & Plan Note (Signed)
Plan- CXR, emphasis on reflux precautions.

## 2012-09-09 ENCOUNTER — Ambulatory Visit (INDEPENDENT_AMBULATORY_CARE_PROVIDER_SITE_OTHER): Payer: Medicare Other

## 2012-09-09 DIAGNOSIS — J309 Allergic rhinitis, unspecified: Secondary | ICD-10-CM

## 2012-09-16 ENCOUNTER — Ambulatory Visit: Payer: Medicare Other

## 2012-09-30 ENCOUNTER — Ambulatory Visit (INDEPENDENT_AMBULATORY_CARE_PROVIDER_SITE_OTHER): Payer: Medicare Other

## 2012-09-30 DIAGNOSIS — J309 Allergic rhinitis, unspecified: Secondary | ICD-10-CM

## 2012-10-02 ENCOUNTER — Other Ambulatory Visit: Payer: Self-pay | Admitting: Internal Medicine

## 2012-10-07 ENCOUNTER — Ambulatory Visit (INDEPENDENT_AMBULATORY_CARE_PROVIDER_SITE_OTHER): Payer: Medicare Other

## 2012-10-07 DIAGNOSIS — J309 Allergic rhinitis, unspecified: Secondary | ICD-10-CM | POA: Diagnosis not present

## 2012-10-12 ENCOUNTER — Other Ambulatory Visit: Payer: Self-pay | Admitting: Internal Medicine

## 2012-10-12 ENCOUNTER — Ambulatory Visit: Payer: Medicare Other

## 2012-10-30 ENCOUNTER — Ambulatory Visit (INDEPENDENT_AMBULATORY_CARE_PROVIDER_SITE_OTHER): Payer: Medicare Other

## 2012-10-30 DIAGNOSIS — J309 Allergic rhinitis, unspecified: Secondary | ICD-10-CM | POA: Diagnosis not present

## 2012-11-04 ENCOUNTER — Ambulatory Visit (INDEPENDENT_AMBULATORY_CARE_PROVIDER_SITE_OTHER): Payer: Medicare Other

## 2012-11-04 DIAGNOSIS — J309 Allergic rhinitis, unspecified: Secondary | ICD-10-CM | POA: Diagnosis not present

## 2012-11-18 ENCOUNTER — Ambulatory Visit (INDEPENDENT_AMBULATORY_CARE_PROVIDER_SITE_OTHER): Payer: Medicare Other

## 2012-11-18 DIAGNOSIS — J309 Allergic rhinitis, unspecified: Secondary | ICD-10-CM | POA: Diagnosis not present

## 2012-11-30 ENCOUNTER — Ambulatory Visit (INDEPENDENT_AMBULATORY_CARE_PROVIDER_SITE_OTHER): Payer: Medicare Other

## 2012-11-30 DIAGNOSIS — J309 Allergic rhinitis, unspecified: Secondary | ICD-10-CM

## 2012-12-07 ENCOUNTER — Ambulatory Visit: Payer: Medicare Other

## 2012-12-07 ENCOUNTER — Telehealth: Payer: Self-pay | Admitting: Internal Medicine

## 2012-12-07 NOTE — Telephone Encounter (Signed)
Pt was requesting a refill on Hydrocodone.

## 2012-12-08 ENCOUNTER — Ambulatory Visit (INDEPENDENT_AMBULATORY_CARE_PROVIDER_SITE_OTHER): Payer: Medicare Other

## 2012-12-08 DIAGNOSIS — J309 Allergic rhinitis, unspecified: Secondary | ICD-10-CM | POA: Diagnosis not present

## 2012-12-08 MED ORDER — HYDROCODONE-ACETAMINOPHEN 7.5-325 MG PO TABS: 1.0000 | ORAL_TABLET | Freq: Four times a day (QID) | ORAL | Status: DC | PRN

## 2012-12-08 NOTE — Telephone Encounter (Signed)
Rx is ready for p/u. Pt informed

## 2012-12-08 NOTE — Telephone Encounter (Signed)
Ok Pls sch f/u OV Thx

## 2012-12-29 ENCOUNTER — Ambulatory Visit (INDEPENDENT_AMBULATORY_CARE_PROVIDER_SITE_OTHER): Payer: Medicare Other

## 2012-12-29 DIAGNOSIS — J309 Allergic rhinitis, unspecified: Secondary | ICD-10-CM

## 2012-12-29 DIAGNOSIS — Z23 Encounter for immunization: Secondary | ICD-10-CM

## 2013-01-04 ENCOUNTER — Ambulatory Visit: Payer: Medicare Other | Admitting: Internal Medicine

## 2013-01-05 ENCOUNTER — Ambulatory Visit (INDEPENDENT_AMBULATORY_CARE_PROVIDER_SITE_OTHER): Payer: Medicare Other

## 2013-01-05 DIAGNOSIS — J309 Allergic rhinitis, unspecified: Secondary | ICD-10-CM

## 2013-01-07 ENCOUNTER — Encounter: Payer: Self-pay | Admitting: Internal Medicine

## 2013-01-16 ENCOUNTER — Other Ambulatory Visit: Payer: Self-pay | Admitting: Internal Medicine

## 2013-01-19 ENCOUNTER — Other Ambulatory Visit: Payer: Self-pay | Admitting: Internal Medicine

## 2013-01-27 ENCOUNTER — Encounter: Payer: Self-pay | Admitting: Internal Medicine

## 2013-01-27 ENCOUNTER — Other Ambulatory Visit (INDEPENDENT_AMBULATORY_CARE_PROVIDER_SITE_OTHER): Payer: Medicare Other

## 2013-01-27 ENCOUNTER — Ambulatory Visit (INDEPENDENT_AMBULATORY_CARE_PROVIDER_SITE_OTHER): Payer: Medicare Other | Admitting: Internal Medicine

## 2013-01-27 ENCOUNTER — Ambulatory Visit (INDEPENDENT_AMBULATORY_CARE_PROVIDER_SITE_OTHER)
Admission: RE | Admit: 2013-01-27 | Discharge: 2013-01-27 | Disposition: A | Discharge status: Home / Self Care | Payer: Medicare Other | Source: Ambulatory Visit | Attending: Internal Medicine | Admitting: Internal Medicine

## 2013-01-27 VITALS — BP 112/78 | HR 100 | Temp 97.8°F | Resp 16 | Ht 62.0 in | Wt 222.8 lb

## 2013-01-27 DIAGNOSIS — K921 Melena: Secondary | ICD-10-CM | POA: Insufficient documentation

## 2013-01-27 DIAGNOSIS — Z86010 Personal history of colonic polyps: Secondary | ICD-10-CM | POA: Diagnosis not present

## 2013-01-27 DIAGNOSIS — K5733 Diverticulitis of large intestine without perforation or abscess with bleeding: Secondary | ICD-10-CM

## 2013-01-27 DIAGNOSIS — R10814 Left lower quadrant abdominal tenderness: Secondary | ICD-10-CM | POA: Insufficient documentation

## 2013-01-27 DIAGNOSIS — I10 Essential (primary) hypertension: Secondary | ICD-10-CM

## 2013-01-27 DIAGNOSIS — D510 Vitamin B12 deficiency anemia due to intrinsic factor deficiency: Secondary | ICD-10-CM

## 2013-01-27 DIAGNOSIS — R05 Cough: Secondary | ICD-10-CM

## 2013-01-27 DIAGNOSIS — R1032 Left lower quadrant pain: Secondary | ICD-10-CM | POA: Diagnosis not present

## 2013-01-27 LAB — COMPREHENSIVE METABOLIC PANEL
ALT: 19 U/L (ref 0–35)
AST: 22 U/L (ref 0–37)
Alkaline Phosphatase: 155 U/L — ABNORMAL HIGH (ref 39–117)
BUN: 12 mg/dL (ref 6–23)
CO2: 28 mEq/L (ref 19–32)
Calcium: 9.4 mg/dL (ref 8.4–10.5)
Chloride: 101 mEq/L (ref 96–112)
Creatinine, Ser: 0.9 mg/dL (ref 0.4–1.2)
GFR: 82.22 mL/min (ref >60.00)
Glucose, Bld: 106 mg/dL — ABNORMAL HIGH (ref 70–99)
Sodium: 138 mEq/L (ref 135–145)
Total Bilirubin: 0.8 mg/dL (ref 0.3–1.2)

## 2013-01-27 LAB — CBC WITH DIFFERENTIAL/PLATELET
Basophils Relative: 0.3 % (ref 0.0–3.0)
Eosinophils Absolute: 0 10*3/uL (ref 0.0–0.7)
HCT: 35.8 % — ABNORMAL LOW (ref 36.0–46.0)
Hemoglobin: 11.3 g/dL — ABNORMAL LOW (ref 12.0–15.0)
Lymphocytes Relative: 19.6 % (ref 12.0–46.0)
Lymphs Abs: 1.5 10*3/uL (ref 0.7–4.0)
MCHC: 31.6 g/dL (ref 30.0–36.0)
Monocytes Absolute: 0.7 10*3/uL (ref 0.1–1.0)
Monocytes Relative: 8.8 % (ref 3.0–12.0)
Neutro Abs: 5.3 10*3/uL (ref 1.4–7.7)
RBC: 5.35 Mil/uL — ABNORMAL HIGH (ref 3.87–5.11)

## 2013-01-27 LAB — RETICULOCYTES
ABS Retic: 43 10*3/uL (ref 19.0–186.0)
RBC.: 5.38 MIL/uL — ABNORMAL HIGH (ref 3.87–5.11)

## 2013-01-27 LAB — LIPASE: Lipase: 17 U/L (ref 11.0–59.0)

## 2013-01-27 LAB — URINALYSIS, ROUTINE W REFLEX MICROSCOPIC
Hgb urine dipstick: NEGATIVE
Nitrite: NEGATIVE
Urobilinogen, UA: 1 (ref 0.0–1.0)
pH: 6 (ref 5.0–8.0)

## 2013-01-27 LAB — IBC PANEL
Saturation Ratios: 6 % — ABNORMAL LOW (ref 20.0–50.0)
Transferrin: 251.6 mg/dL (ref 212.0–360.0)

## 2013-01-27 LAB — AMYLASE: Amylase: 98 U/L (ref 27–131)

## 2013-01-27 MED ORDER — CIPROFLOXACIN HCL 500 MG PO TABS: 500.0000 mg | ORAL_TABLET | Freq: Two times a day (BID) | ORAL | Status: AC

## 2013-01-27 MED ORDER — PROMETHAZINE-CODEINE 6.25-10 MG/5ML PO SYRP: 5.0000 mL | ORAL_SOLUTION | Freq: Four times a day (QID) | ORAL | Status: DC | PRN

## 2013-01-27 MED ORDER — METRONIDAZOLE 500 MG PO TABS: 500.0000 mg | ORAL_TABLET | Freq: Three times a day (TID) | ORAL | Status: AC

## 2013-01-27 NOTE — Progress Notes (Signed)
Subjective:    Patient ID: [name], female    DOB: 05-28-1950, 62 y.o.   MRN: [id number]  Abdominal Pain This is a new problem. Episode onset: 5 days ago. The onset quality is gradual. The problem occurs intermittently. The problem has been unchanged. The pain is located in the LLQ. The pain is at a severity of 2/10. The pain is mild. The quality of the pain is sharp. The abdominal pain does not radiate. Associated symptoms include constipation. Pertinent negatives include no anorexia, arthralgias, belching, diarrhea, dysuria, fever, flatus, frequency, headaches, hematochezia, hematuria, melena, myalgias, nausea, vomiting or weight loss. Nothing aggravates the pain. The pain is relieved by nothing. She has tried nothing for the symptoms. There is no history of abdominal surgery, colon cancer, Crohn's disease, gallstones, GERD, irritable bowel syndrome, pancreatitis, PUD or ulcerative colitis.      Review of Systems  Constitutional: Negative.  Negative for fever, chills, weight loss, diaphoresis, activity change, appetite change, fatigue and unexpected weight change.  HENT: Negative.   Eyes: Negative.   Respiratory: Positive for cough (chronic NP cough). Negative for apnea, choking, chest tightness, shortness of breath, wheezing and stridor.   Cardiovascular: Negative.  Negative for chest pain, palpitations and leg swelling.  Gastrointestinal: Positive for abdominal pain and constipation. Negative for nausea, vomiting, diarrhea, blood in stool, melena, hematochezia, abdominal distention, anal bleeding, rectal pain, anorexia and flatus.  Endocrine: Negative.   Genitourinary: Negative.  Negative for dysuria, frequency and hematuria.  Musculoskeletal: Negative.  Negative for arthralgias and myalgias.  Skin: Negative.   Allergic/Immunologic: Negative.   Neurological: Negative.  Negative for headaches.  Hematological: Negative.  Negative for adenopathy. Does not bruise/bleed easily.    Psychiatric/Behavioral: Negative.        Objective:   Physical Exam  Vitals reviewed. Constitutional: She appears well-developed and well-nourished.  Non-toxic appearance. She does not have a sickly appearance. She does not appear ill. No distress.  HENT:  Head: Normocephalic and atraumatic.  Mouth/Throat: Oropharynx is clear and moist. No oropharyngeal exudate.  Eyes: Conjunctivae are normal. Right eye exhibits no discharge. Left eye exhibits no discharge. No scleral icterus.  Neck: Normal range of motion. Neck supple. No JVD present. No tracheal deviation present. No thyromegaly present.  Cardiovascular: Normal rate, regular rhythm, normal heart sounds and intact distal pulses.  Exam reveals no gallop and no friction rub.   No murmur heard. Pulmonary/Chest: Effort normal and breath sounds normal. No stridor. No respiratory distress. She has no wheezes. She has no rales. She exhibits no tenderness.  Abdominal: Soft. Normal appearance and bowel sounds are normal. She exhibits no shifting dullness, no distension, no pulsatile liver, no fluid wave, no abdominal bruit, no ascites, no pulsatile midline mass and no mass. There is no hepatosplenomegaly, splenomegaly or hepatomegaly. There is tenderness in the left lower quadrant. There is no rigidity, no rebound, no guarding, no CVA tenderness, no tenderness at McBurney's point and negative Murphy's sign. No hernia. Hernia confirmed negative in the ventral area.  Genitourinary: Rectal exam shows no external hemorrhoid, no internal hemorrhoid, no fissure, no mass, no tenderness and anal tone normal. Guaiac positive stool.  Lymphadenopathy:    She has no cervical adenopathy.  Skin: She is not diaphoretic.     Lab Results  Component Value Date   WBC 7.1 07/14/2012   HGB 10.5* 07/14/2012   HCT 32.3* 07/14/2012   PLT 308.0 07/14/2012   GLUCOSE 93 07/14/2012   CHOL 144 07/14/2012   TRIG 59.0  07/14/2012   HDL 76.60 07/14/2012   LDLCALC 56 07/14/2012    ALT 18 07/14/2012   AST 19 07/14/2012   NA 142 07/14/2012   K 3.8 07/14/2012   CL 106 07/14/2012   CREATININE 1.0 07/14/2012   BUN 16 07/14/2012   CO2 29 07/14/2012   TSH 1.72 07/14/2012   INR 1.0 ratio 09/07/2009   HGBA1C 6.2 09/07/2009       Assessment & Plan:

## 2013-01-27 NOTE — Progress Notes (Signed)
Pre visit review using our clinic review tool, if applicable. No additional management support is needed unless otherwise documented below in the visit note. 

## 2013-01-27 NOTE — Assessment & Plan Note (Addendum)
The software blocked me from ordering a B12 and folate level Today I will recheck her CBC and will look at her vitamin levels

## 2013-01-27 NOTE — Patient Instructions (Signed)

## 2013-01-28 ENCOUNTER — Encounter: Payer: Self-pay | Admitting: Internal Medicine

## 2013-01-28 NOTE — Assessment & Plan Note (Signed)
Her plain films of the abd are normal I have ordered labs to look for other secondary causes Will treat for diverticulitis

## 2013-01-28 NOTE — Assessment & Plan Note (Signed)
I have ordered a CT to see if she has an abscess For now, will treat her with cipro and flagyl

## 2013-01-28 NOTE — Assessment & Plan Note (Signed)
Her CXR is norma today  She will cont taking meds as needed for the cough and will cont to f/up with pulm medicine

## 2013-01-28 NOTE — Assessment & Plan Note (Signed)
She has a history of polyps and is anemic so I have asked her to see GI to consider endoscopy

## 2013-01-29 ENCOUNTER — Ambulatory Visit (INDEPENDENT_AMBULATORY_CARE_PROVIDER_SITE_OTHER): Payer: Medicare Other

## 2013-01-29 ENCOUNTER — Telehealth: Payer: Self-pay

## 2013-01-29 ENCOUNTER — Encounter: Payer: Self-pay | Admitting: Internal Medicine

## 2013-01-29 ENCOUNTER — Other Ambulatory Visit: Payer: Self-pay | Admitting: Internal Medicine

## 2013-01-29 DIAGNOSIS — J309 Allergic rhinitis, unspecified: Secondary | ICD-10-CM

## 2013-01-29 DIAGNOSIS — D509 Iron deficiency anemia, unspecified: Secondary | ICD-10-CM | POA: Insufficient documentation

## 2013-01-29 LAB — TSH: TSH: 1.91 u[IU]/mL (ref 0.35–5.50)

## 2013-01-29 MED ORDER — FERRALET 90 90-1 MG PO TABS: 1.0000 | ORAL_TABLET | Freq: Every day | ORAL | Status: DC

## 2013-01-29 NOTE — Telephone Encounter (Signed)
She is anemic and her iron level is low, the other labs were okay Plain xray was normal, she needs to get the CT scan done asap  T. Ronnald Ramp

## 2013-01-29 NOTE — Telephone Encounter (Signed)
Patient is requesting results from test this week.  States she is still in a lot of pain. Advise please

## 2013-01-29 NOTE — Telephone Encounter (Signed)
Patient informed of results and MD instructions.

## 2013-02-01 ENCOUNTER — Telehealth: Payer: Self-pay

## 2013-02-01 DIAGNOSIS — D509 Iron deficiency anemia, unspecified: Secondary | ICD-10-CM

## 2013-02-01 MED ORDER — FERROUS SULFATE 325 (65 FE) MG PO TABS: 325.0000 mg | ORAL_TABLET | Freq: Two times a day (BID) | ORAL | Status: DC

## 2013-02-01 NOTE — Telephone Encounter (Signed)
Received fax from [facility] stating that ferrealet is a part D plan exlusion and will not be covered. Cash price is $133.45, which pt cannot afford. Please advise

## 2013-02-02 ENCOUNTER — Emergency Department (HOSPITAL_COMMUNITY): Payer: Medicare Other

## 2013-02-02 ENCOUNTER — Inpatient Hospital Stay (HOSPITAL_COMMUNITY)
Admission: EM | Admit: 2013-02-02 | Discharge: 2013-02-17 | DRG: 329 | Disposition: A | Discharge status: Skilled Nursing Facility | Payer: Medicare Other | Attending: Family Medicine | Admitting: Family Medicine

## 2013-02-02 ENCOUNTER — Encounter (HOSPITAL_COMMUNITY): Payer: Self-pay | Admitting: Emergency Medicine

## 2013-02-02 DIAGNOSIS — H359 Unspecified retinal disorder: Secondary | ICD-10-CM | POA: Diagnosis not present

## 2013-02-02 DIAGNOSIS — R109 Unspecified abdominal pain: Secondary | ICD-10-CM

## 2013-02-02 DIAGNOSIS — Z8249 Family history of ischemic heart disease and other diseases of the circulatory system: Secondary | ICD-10-CM | POA: Diagnosis not present

## 2013-02-02 DIAGNOSIS — N179 Acute kidney failure, unspecified: Secondary | ICD-10-CM | POA: Diagnosis not present

## 2013-02-02 DIAGNOSIS — M25569 Pain in unspecified knee: Secondary | ICD-10-CM

## 2013-02-02 DIAGNOSIS — K3189 Other diseases of stomach and duodenum: Secondary | ICD-10-CM | POA: Diagnosis not present

## 2013-02-02 DIAGNOSIS — E861 Hypovolemia: Secondary | ICD-10-CM | POA: Diagnosis not present

## 2013-02-02 DIAGNOSIS — D378 Neoplasm of uncertain behavior of other specified digestive organs: Secondary | ICD-10-CM | POA: Diagnosis not present

## 2013-02-02 DIAGNOSIS — Z6841 Body Mass Index (BMI) 40.0 and over, adult: Secondary | ICD-10-CM | POA: Diagnosis not present

## 2013-02-02 DIAGNOSIS — C185 Malignant neoplasm of splenic flexure: Secondary | ICD-10-CM | POA: Diagnosis not present

## 2013-02-02 DIAGNOSIS — D010 Carcinoma in situ of colon: Secondary | ICD-10-CM | POA: Diagnosis not present

## 2013-02-02 DIAGNOSIS — R0989 Other specified symptoms and signs involving the circulatory and respiratory systems: Secondary | ICD-10-CM | POA: Diagnosis not present

## 2013-02-02 DIAGNOSIS — M79603 Pain in arm, unspecified: Secondary | ICD-10-CM

## 2013-02-02 DIAGNOSIS — M545 Low back pain, unspecified: Secondary | ICD-10-CM | POA: Diagnosis present

## 2013-02-02 DIAGNOSIS — R112 Nausea with vomiting, unspecified: Secondary | ICD-10-CM | POA: Diagnosis not present

## 2013-02-02 DIAGNOSIS — R1084 Generalized abdominal pain: Secondary | ICD-10-CM | POA: Diagnosis not present

## 2013-02-02 DIAGNOSIS — D638 Anemia in other chronic diseases classified elsewhere: Secondary | ICD-10-CM | POA: Diagnosis present

## 2013-02-02 DIAGNOSIS — R634 Abnormal weight loss: Secondary | ICD-10-CM

## 2013-02-02 DIAGNOSIS — E876 Hypokalemia: Secondary | ICD-10-CM | POA: Diagnosis not present

## 2013-02-02 DIAGNOSIS — R579 Shock, unspecified: Secondary | ICD-10-CM | POA: Diagnosis not present

## 2013-02-02 DIAGNOSIS — D62 Acute posthemorrhagic anemia: Secondary | ICD-10-CM | POA: Diagnosis not present

## 2013-02-02 DIAGNOSIS — Z860100 Personal history of colon polyps, unspecified: Secondary | ICD-10-CM

## 2013-02-02 DIAGNOSIS — E669 Obesity, unspecified: Secondary | ICD-10-CM | POA: Diagnosis present

## 2013-02-02 DIAGNOSIS — G8929 Other chronic pain: Secondary | ICD-10-CM | POA: Diagnosis present

## 2013-02-02 DIAGNOSIS — D490 Neoplasm of unspecified behavior of digestive system: Secondary | ICD-10-CM

## 2013-02-02 DIAGNOSIS — K5733 Diverticulitis of large intestine without perforation or abscess with bleeding: Secondary | ICD-10-CM

## 2013-02-02 DIAGNOSIS — E2749 Other adrenocortical insufficiency: Secondary | ICD-10-CM | POA: Diagnosis not present

## 2013-02-02 DIAGNOSIS — K56609 Unspecified intestinal obstruction, unspecified as to partial versus complete obstruction: Secondary | ICD-10-CM

## 2013-02-02 DIAGNOSIS — E872 Acidosis, unspecified: Secondary | ICD-10-CM | POA: Diagnosis not present

## 2013-02-02 DIAGNOSIS — J9819 Other pulmonary collapse: Secondary | ICD-10-CM | POA: Diagnosis not present

## 2013-02-02 DIAGNOSIS — R10814 Left lower quadrant abdominal tenderness: Secondary | ICD-10-CM

## 2013-02-02 DIAGNOSIS — Z5189 Encounter for other specified aftercare: Secondary | ICD-10-CM | POA: Diagnosis not present

## 2013-02-02 DIAGNOSIS — E43 Unspecified severe protein-calorie malnutrition: Secondary | ICD-10-CM | POA: Diagnosis present

## 2013-02-02 DIAGNOSIS — G4733 Obstructive sleep apnea (adult) (pediatric): Secondary | ICD-10-CM | POA: Diagnosis present

## 2013-02-02 DIAGNOSIS — R1013 Epigastric pain: Secondary | ICD-10-CM

## 2013-02-02 DIAGNOSIS — S139XXA Sprain of joints and ligaments of unspecified parts of neck, initial encounter: Secondary | ICD-10-CM

## 2013-02-02 DIAGNOSIS — D509 Iron deficiency anemia, unspecified: Secondary | ICD-10-CM | POA: Diagnosis present

## 2013-02-02 DIAGNOSIS — K219 Gastro-esophageal reflux disease without esophagitis: Secondary | ICD-10-CM | POA: Diagnosis not present

## 2013-02-02 DIAGNOSIS — R1032 Left lower quadrant pain: Secondary | ICD-10-CM | POA: Diagnosis not present

## 2013-02-02 DIAGNOSIS — K6389 Other specified diseases of intestine: Secondary | ICD-10-CM | POA: Diagnosis present

## 2013-02-02 DIAGNOSIS — S82899A Other fracture of unspecified lower leg, initial encounter for closed fracture: Secondary | ICD-10-CM

## 2013-02-02 DIAGNOSIS — I959 Hypotension, unspecified: Secondary | ICD-10-CM | POA: Diagnosis not present

## 2013-02-02 DIAGNOSIS — R059 Cough, unspecified: Secondary | ICD-10-CM

## 2013-02-02 DIAGNOSIS — C7A023 Malignant carcinoid tumor of the transverse colon: Secondary | ICD-10-CM | POA: Diagnosis not present

## 2013-02-02 DIAGNOSIS — Z452 Encounter for adjustment and management of vascular access device: Secondary | ICD-10-CM | POA: Diagnosis not present

## 2013-02-02 DIAGNOSIS — J4520 Mild intermittent asthma, uncomplicated: Secondary | ICD-10-CM | POA: Diagnosis present

## 2013-02-02 DIAGNOSIS — I10 Essential (primary) hypertension: Secondary | ICD-10-CM | POA: Diagnosis present

## 2013-02-02 DIAGNOSIS — J45909 Unspecified asthma, uncomplicated: Secondary | ICD-10-CM | POA: Diagnosis not present

## 2013-02-02 DIAGNOSIS — Z0000 Encounter for general adult medical examination without abnormal findings: Secondary | ICD-10-CM

## 2013-02-02 DIAGNOSIS — G4710 Hypersomnia, unspecified: Secondary | ICD-10-CM | POA: Diagnosis not present

## 2013-02-02 DIAGNOSIS — I369 Nonrheumatic tricuspid valve disorder, unspecified: Secondary | ICD-10-CM | POA: Diagnosis not present

## 2013-02-02 DIAGNOSIS — D371 Neoplasm of uncertain behavior of stomach: Secondary | ICD-10-CM | POA: Diagnosis not present

## 2013-02-02 DIAGNOSIS — R918 Other nonspecific abnormal finding of lung field: Secondary | ICD-10-CM | POA: Diagnosis not present

## 2013-02-02 DIAGNOSIS — D510 Vitamin B12 deficiency anemia due to intrinsic factor deficiency: Secondary | ICD-10-CM

## 2013-02-02 DIAGNOSIS — K921 Melena: Secondary | ICD-10-CM | POA: Diagnosis not present

## 2013-02-02 DIAGNOSIS — M549 Dorsalgia, unspecified: Secondary | ICD-10-CM | POA: Diagnosis not present

## 2013-02-02 DIAGNOSIS — D649 Anemia, unspecified: Secondary | ICD-10-CM | POA: Diagnosis not present

## 2013-02-02 DIAGNOSIS — G4730 Sleep apnea, unspecified: Secondary | ICD-10-CM | POA: Diagnosis not present

## 2013-02-02 DIAGNOSIS — G8918 Other acute postprocedural pain: Secondary | ICD-10-CM | POA: Diagnosis not present

## 2013-02-02 DIAGNOSIS — C19 Malignant neoplasm of rectosigmoid junction: Secondary | ICD-10-CM | POA: Diagnosis not present

## 2013-02-02 DIAGNOSIS — R05 Cough: Secondary | ICD-10-CM

## 2013-02-02 DIAGNOSIS — J301 Allergic rhinitis due to pollen: Secondary | ICD-10-CM

## 2013-02-02 DIAGNOSIS — R509 Fever, unspecified: Secondary | ICD-10-CM | POA: Diagnosis not present

## 2013-02-02 DIAGNOSIS — C801 Malignant (primary) neoplasm, unspecified: Secondary | ICD-10-CM | POA: Diagnosis not present

## 2013-02-02 DIAGNOSIS — Z7982 Long term (current) use of aspirin: Secondary | ICD-10-CM

## 2013-02-02 DIAGNOSIS — Z79899 Other long term (current) drug therapy: Secondary | ICD-10-CM

## 2013-02-02 DIAGNOSIS — R7309 Other abnormal glucose: Secondary | ICD-10-CM

## 2013-02-02 DIAGNOSIS — Z809 Family history of malignant neoplasm, unspecified: Secondary | ICD-10-CM

## 2013-02-02 DIAGNOSIS — E869 Volume depletion, unspecified: Secondary | ICD-10-CM | POA: Diagnosis present

## 2013-02-02 LAB — URINALYSIS, ROUTINE W REFLEX MICROSCOPIC
BILIRUBIN URINE: NEGATIVE
Glucose, UA: NEGATIVE mg/dL
HGB URINE DIPSTICK: NEGATIVE
KETONES UR: NEGATIVE mg/dL
Leukocytes, UA: NEGATIVE
Nitrite: NEGATIVE
PROTEIN: NEGATIVE mg/dL
Specific Gravity, Urine: >1.046 — ABNORMAL HIGH (ref 1.005–1.030)
UROBILINOGEN UA: 0.2 mg/dL (ref 0.0–1.0)
pH: 5 (ref 5.0–8.0)

## 2013-02-02 LAB — CBC WITH DIFFERENTIAL/PLATELET
BASOS PCT: 0 % (ref 0–1)
Basophils Absolute: 0 10*3/uL (ref 0.0–0.1)
EOS ABS: 0 10*3/uL (ref 0.0–0.7)
Eosinophils Relative: 0 % (ref 0–5)
HEMATOCRIT: 38 % (ref 36.0–46.0)
Hemoglobin: 12.5 g/dL (ref 12.0–15.0)
Lymphocytes Relative: 14 % (ref 12–46)
Lymphs Abs: 1.4 10*3/uL (ref 0.7–4.0)
MCH: 21.8 pg — ABNORMAL LOW (ref 26.0–34.0)
MCHC: 32.9 g/dL (ref 30.0–36.0)
MCV: 66.3 fL — ABNORMAL LOW (ref 78.0–100.0)
Monocytes Absolute: 0.8 10*3/uL (ref 0.1–1.0)
Monocytes Relative: 8 % (ref 3–12)
NEUTROS ABS: 8 10*3/uL — AB (ref 1.7–7.7)
Neutrophils Relative %: 78 % — ABNORMAL HIGH (ref 43–77)
Platelets: 416 10*3/uL — ABNORMAL HIGH (ref 150–400)
RBC: 5.73 MIL/uL — ABNORMAL HIGH (ref 3.87–5.11)
RDW: 17.7 % — ABNORMAL HIGH (ref 11.5–15.5)
WBC: 10.2 10*3/uL (ref 4.0–10.5)

## 2013-02-02 LAB — CEA: CEA: <0.5 ng/mL (ref 0.0–5.0)

## 2013-02-02 LAB — COMPREHENSIVE METABOLIC PANEL
ALK PHOS: 143 U/L — AB (ref 39–117)
ALT: 25 U/L (ref 0–35)
AST: 27 U/L (ref 0–37)
Albumin: 3.4 g/dL — ABNORMAL LOW (ref 3.5–5.2)
BILIRUBIN TOTAL: 0.3 mg/dL (ref 0.3–1.2)
BUN: 16 mg/dL (ref 6–23)
CO2: 22 meq/L (ref 19–32)
Calcium: 9.8 mg/dL (ref 8.4–10.5)
Chloride: 101 mEq/L (ref 96–112)
Creatinine, Ser: 1.1 mg/dL (ref 0.50–1.10)
GFR, EST AFRICAN AMERICAN: 61 mL/min — AB (ref >90)
GFR, EST NON AFRICAN AMERICAN: 52 mL/min — AB (ref >90)
GLUCOSE: 119 mg/dL — AB (ref 70–99)
Potassium: 3.2 mEq/L — ABNORMAL LOW (ref 3.7–5.3)
SODIUM: 140 meq/L (ref 137–147)
Total Protein: 7.7 g/dL (ref 6.0–8.3)

## 2013-02-02 LAB — CREATININE, SERUM
Creatinine, Ser: 1.08 mg/dL (ref 0.50–1.10)
GFR calc Af Amer: 62 mL/min — ABNORMAL LOW (ref >90)
GFR calc non Af Amer: 53 mL/min — ABNORMAL LOW (ref >90)

## 2013-02-02 LAB — CBC
HCT: 38 % (ref 36.0–46.0)
Hemoglobin: 12.2 g/dL (ref 12.0–15.0)
MCH: 21.6 pg — AB (ref 26.0–34.0)
MCHC: 32.1 g/dL (ref 30.0–36.0)
MCV: 67.3 fL — AB (ref 78.0–100.0)
PLATELETS: 415 10*3/uL — AB (ref 150–400)
RBC: 5.65 MIL/uL — ABNORMAL HIGH (ref 3.87–5.11)
RDW: 17.9 % — AB (ref 11.5–15.5)
WBC: 11.2 10*3/uL — ABNORMAL HIGH (ref 4.0–10.5)

## 2013-02-02 LAB — PROTIME-INR
INR: 1.07 (ref 0.00–1.49)
PROTHROMBIN TIME: 13.7 s (ref 11.6–15.2)

## 2013-02-02 LAB — LIPASE, BLOOD: Lipase: 22 U/L (ref 11–59)

## 2013-02-02 MED ORDER — ONDANSETRON HCL 4 MG/2ML IJ SOLN
4.0000 mg | Freq: Once | INTRAMUSCULAR | Status: AC
  Administered 2013-02-02: 4 mg via INTRAVENOUS
  Filled 2013-02-02: qty 2

## 2013-02-02 MED ORDER — PEG-KCL-NACL-NASULF-NA ASC-C 100 G PO SOLR: 1.0000 | Freq: Once | ORAL | Status: DC

## 2013-02-02 MED ORDER — KCL IN DEXTROSE-NACL 20-5-0.45 MEQ/L-%-% IV SOLN
INTRAVENOUS | Status: DC
  Administered 2013-02-02: 22:00:00 via INTRAVENOUS
  Administered 2013-02-03: 14:00:00 1000 mL via INTRAVENOUS
  Administered 2013-02-03: 22:00:00 via INTRAVENOUS
  Administered 2013-02-04: 16:00:00 125 mL/h via INTRAVENOUS
  Administered 2013-02-04 – 2013-02-05 (×2): via INTRAVENOUS
  Filled 2013-02-02 (×10): qty 1000

## 2013-02-02 MED ORDER — ONDANSETRON HCL 4 MG/2ML IJ SOLN
4.0000 mg | Freq: Four times a day (QID) | INTRAMUSCULAR | Status: DC | PRN
  Administered 2013-02-03 – 2013-02-09 (×8): 4 mg via INTRAVENOUS
  Filled 2013-02-02 (×9): qty 2

## 2013-02-02 MED ORDER — PANTOPRAZOLE SODIUM 40 MG IV SOLR
40.0000 mg | INTRAVENOUS | Status: DC
Start: 2013-02-02 — End: 2013-02-12
  Administered 2013-02-02 – 2013-02-11 (×10): 40 mg via INTRAVENOUS
  Filled 2013-02-02 (×12): qty 40

## 2013-02-02 MED ORDER — MORPHINE SULFATE 4 MG/ML IJ SOLN
4.0000 mg | Freq: Once | INTRAMUSCULAR | Status: AC
  Administered 2013-02-02: 4 mg via INTRAVENOUS
  Filled 2013-02-02: qty 1

## 2013-02-02 MED ORDER — IOHEXOL 300 MG/ML  SOLN
25.0000 mL | Freq: Once | INTRAMUSCULAR | Status: AC | PRN
  Administered 2013-02-02: 25 mL via ORAL

## 2013-02-02 MED ORDER — ALBUTEROL SULFATE (2.5 MG/3ML) 0.083% IN NEBU
2.5000 mg | INHALATION_SOLUTION | Freq: Four times a day (QID) | RESPIRATORY_TRACT | Status: DC | PRN
  Administered 2013-02-03: 2.5 mg via RESPIRATORY_TRACT

## 2013-02-02 MED ORDER — MONTELUKAST SODIUM 10 MG PO TABS
10.0000 mg | ORAL_TABLET | Freq: Every day | ORAL | Status: DC
  Administered 2013-02-03 – 2013-02-04 (×3): 10 mg via ORAL
  Filled 2013-02-02 (×5): qty 1

## 2013-02-02 MED ORDER — FLUTICASONE PROPIONATE 50 MCG/ACT NA SUSP
2.0000 | Freq: Every day | NASAL | Status: DC | PRN
  Filled 2013-02-02: qty 16

## 2013-02-02 MED ORDER — PEG-KCL-NACL-NASULF-NA ASC-C 100 G PO SOLR
0.5000 | Freq: Once | ORAL | Status: AC
Start: 2013-02-03 — End: 2013-02-03
  Administered 2013-02-03: 07:00:00 100 g via ORAL
  Filled 2013-02-02: qty 1

## 2013-02-02 MED ORDER — IOHEXOL 300 MG/ML  SOLN
100.0000 mL | Freq: Once | INTRAMUSCULAR | Status: AC | PRN
  Administered 2013-02-02: 100 mL via INTRAVENOUS

## 2013-02-02 MED ORDER — ALBUTEROL SULFATE HFA 108 (90 BASE) MCG/ACT IN AERS: 2.0000 | INHALATION_SPRAY | Freq: Four times a day (QID) | RESPIRATORY_TRACT | Status: DC | PRN

## 2013-02-02 MED ORDER — PEG-KCL-NACL-NASULF-NA ASC-C 100 G PO SOLR
0.5000 | Freq: Once | ORAL | Status: AC
  Administered 2013-02-02: 100 g via ORAL
  Filled 2013-02-02: qty 1

## 2013-02-02 MED ORDER — MORPHINE SULFATE 2 MG/ML IJ SOLN
1.0000 mg | INTRAMUSCULAR | Status: DC | PRN
  Administered 2013-02-02 – 2013-02-04 (×9): 2 mg via INTRAVENOUS
  Filled 2013-02-02 (×9): qty 1

## 2013-02-02 MED ORDER — HEPARIN SODIUM (PORCINE) 5000 UNIT/ML IJ SOLN
5000.0000 [IU] | Freq: Three times a day (TID) | INTRAMUSCULAR | Status: DC
  Administered 2013-02-03 (×2): 5000 [IU] via SUBCUTANEOUS
  Filled 2013-02-02 (×5): qty 1

## 2013-02-02 MED ORDER — ONDANSETRON HCL 4 MG PO TABS
4.0000 mg | ORAL_TABLET | Freq: Four times a day (QID) | ORAL | Status: DC | PRN
  Administered 2013-02-05: 4 mg via ORAL

## 2013-02-02 MED ORDER — SODIUM CHLORIDE 0.9 % IV BOLUS (SEPSIS)
1000.0000 mL | Freq: Once | INTRAVENOUS | Status: AC
  Administered 2013-02-02: 1000 mL via INTRAVENOUS

## 2013-02-02 MED ORDER — ASPIRIN 81 MG PO CHEW
81.0000 mg | CHEWABLE_TABLET | Freq: Every day | ORAL | Status: DC
Start: 2013-02-02 — End: 2013-02-04
  Administered 2013-02-02: 81 mg via ORAL
  Filled 2013-02-02 (×3): qty 1

## 2013-02-02 MED ORDER — HEPARIN SODIUM (PORCINE) 5000 UNIT/ML IJ SOLN
5000.0000 [IU] | Freq: Three times a day (TID) | INTRAMUSCULAR | Status: DC
Start: 2013-02-02 — End: 2013-02-02
  Administered 2013-02-02: 5000 [IU] via SUBCUTANEOUS
  Filled 2013-02-02 (×3): qty 1

## 2013-02-02 MED ORDER — ALBUTEROL SULFATE (2.5 MG/3ML) 0.083% IN NEBU
2.5000 mg | INHALATION_SOLUTION | Freq: Four times a day (QID) | RESPIRATORY_TRACT | Status: DC | PRN
  Filled 2013-02-02: qty 3

## 2013-02-02 MED ORDER — ENALAPRILAT 1.25 MG/ML IV SOLN
0.6250 mg | Freq: Four times a day (QID) | INTRAVENOUS | Status: DC
  Administered 2013-02-03 – 2013-02-04 (×4): 0.625 mg via INTRAVENOUS
  Filled 2013-02-02 (×15): qty 0.5

## 2013-02-02 MED ORDER — MOMETASONE FURO-FORMOTEROL FUM 100-5 MCG/ACT IN AERO
2.0000 | INHALATION_SPRAY | Freq: Two times a day (BID) | RESPIRATORY_TRACT | Status: DC
  Administered 2013-02-03 – 2013-02-04 (×2): 2 via RESPIRATORY_TRACT
  Filled 2013-02-02: qty 8.8

## 2013-02-02 NOTE — Consult Note (Signed)
I have seen and examined the pt and agree with PA-Jenning's progress note. Will need c-scope per GI  Will send CEA Will likely need L colectomy on this hospital stay.
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
## 2013-02-02 NOTE — ED Notes (Signed)
Bedside commode placed at bedside for patient use with colonoscopy preparation
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
## 2013-02-02 NOTE — ED Notes (Signed)
Pt in c/o abd pain with vomiting since last week, states she went to her PMD and was given pain medication with no relief, states vomiting is intermittent

## 2013-02-02 NOTE — ED Notes (Signed)
CT made aware pt finished drinking PO contrast. 

## 2013-02-02 NOTE — ED Notes (Signed)
Pt unable to hold moviprep down.  Pt threw up all prep in green emesis bag.

## 2013-02-02 NOTE — ED Notes (Signed)
Report given to [place] on 5W and [place]

## 2013-02-02 NOTE — ED Notes (Signed)
CT scan giving patient oral contrast.

## 2013-02-02 NOTE — ED Notes (Signed)
General Surgery at the bedside

## 2013-02-02 NOTE — Consult Note (Signed)
Reason for Consult:  Abdominal pain with Splenic flexure obstructing mass Referring Physician: Montine Circle PA (ED @ [facility]) PCP:  Walker Kehr, MD Pulmonary:  Dr. Baird Lyons  [name] is an 63 y.o. female.  HPI: This is a fairly healthy woman with some minor medical issues, on disability for shoulder and had issues.  She noted feeling pain LLQ before Christmas.  This was followed by bloating issues.  She just felt like her stomach was blown up.  She has not had nausea, vomiting, or trouble eating. She just has not had an appetite.  She notes a 16 pound weight loss since August 2014, that was not intended. Her symptoms persisted and she was seen at clinic on 01/27/13.  Stool guaiac was positive, she had some mild anemia and was treated for diverticulitis with Cipro and Flagyl.  Over the last 2 days she has had pain with nausea and some vomiting. She has not had nausea prior to this. She just was not eating because she wasn't hungry.   She is having stools but these are loose, not regular BM's.    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 Rough estimation of tumor size 3-4 cm. Colonoscopy likely would be the most appropriate next step in evaluation.  Obstructed, dilated proximal colon. Decompressed distal colon.  No liver mass or distant metastatic disease identified. Labs show a low K+ and elevated Alk Phos, otherwise normal.  We have been ask to see.   Past Medical History  Diagnosis Date  Chronic back pain   Hypertension   OSA (obstructive sleep apnea)   GERD (gastroesophageal reflux disease)   Colon polyp  Colonosocopy 2006  [facility]   Obesity    Asthma   Chronic cough  Allergic rhinitis     . Abnormal glucose     Knee pain  Cervical strain                Past Surgical  History  Procedure Laterality Date  . Appendectomy  1970s  . Cholecystectomy  1983  . Abdominal hysterectomy w/ partial vaginactomy  1982    has one remaining ovary  . Wrist surgery  2003    Right  . Shoulder surgery  2005    Right  . Ankle fracture surgery  4/11    [facility] Dr. Marlou Sa  . Abdominal hysterectomy      Family History  Problem Relation Age of Onset  . Cancer Mother   . Heart disease Father   . Suicidality Other     siblings  . Cancer Other     siblings    Social History:  reports that she has never smoked. She has never used smokeless tobacco. She reports that she does not drink alcohol or use illicit drugs.  Allergies: No Known Allergies  Prior to Admission medications   Medication Sig Start Date End Date Taking? Authorizing Provider  albuterol (PROAIR HFA) 108 (90 BASE) MCG/ACT inhaler Inhale 2 puffs into the lungs every 6 (six) hours as needed for wheezing or shortness of breath. 07/14/12 07/14/13 Yes Evie Lacks Plotnikov, MD  albuterol (PROVENTIL) (2.5 MG/3ML) 0.083% nebulizer solution Take 3 mLs (2.5 mg total) by nebulization every 6 (six) hours as needed for wheezing or shortness of breath. DX:  493.90 08/23/11 02/02/13 Yes Deneise Lever, MD  aspirin 81 MG tablet Take 81 mg by mouth daily.     Yes Historical Provider, MD  Cholecalciferol (VITAMIN D) 1000 UNITS capsule Take 1,000 Units by mouth daily.     Yes Historical Provider, MD  Fluticasone-Salmeterol (ADVAIR DISKUS) 250-50 MCG/DOSE AEPB 1 puff, then rinse mouth, twice daily 07/14/12 07/14/13 Yes Evie Lacks Plotnikov, MD  HYDROcodone-acetaminophen (NORCO) 7.5-325 MG per tablet Take 1 tablet by mouth 4 (four) times daily as needed. 12/08/12  Yes Evie Lacks Plotnikov, MD  mometasone (NASONEX) 50 MCG/ACT nasal spray Place 2 sprays into the nose daily as needed. 07/14/12 07/14/13 Yes Evie Lacks Plotnikov, MD  montelukast (SINGULAIR) 10 MG tablet Take 1 tablet (10 mg total) by mouth at bedtime. 07/14/12  Yes Evie Lacks Plotnikov,  MD  omeprazole (PRILOSEC) 20 MG capsule Take 1 capsule (20 mg total) by mouth daily. 07/14/12  Yes Evie Lacks Plotnikov, MD  potassium chloride (K-DUR) 10 MEQ tablet Take 10 mEq by mouth daily.   Yes Historical Provider, MD  valsartan-hydrochlorothiazide (DIOVAN-HCT) 160-25 MG per tablet Take 1 tablet by mouth daily. 07/14/12  Yes Evie Lacks Plotnikov, MD  ciprofloxacin (CIPRO) 500 MG tablet Take 1 tablet (500 mg total) by mouth 2 (two) times daily. 01/27/13 02/06/13  Janith Lima, MD  ferrous sulfate 325 (65 FE) MG tablet Take 1 tablet (325 mg total) by mouth 2 (two) times daily with a meal. 02/01/13   Janith Lima, MD  metroNIDAZOLE (FLAGYL) 500 MG tablet Take 1 tablet (500 mg total) by mouth 3 (three) times daily. 01/27/13 02/06/13  Janith Lima, MD  promethazine-codeine (PHENERGAN WITH CODEINE) 6.25-10 MG/5ML syrup Take 5 mLs by mouth every 6 (six) hours as needed for cough. 01/27/13   Janith Lima, MD    Anti-infectives   None      Results for orders placed during the hospital encounter of 02/02/13 (from the past 48 hour(s))  CBC WITH DIFFERENTIAL     Status: Abnormal   Collection Time    02/02/13  1:50 AM      Result Value Range   WBC 10.2  4.0 - 10.5 K/uL   RBC 5.73 (*) 3.87 - 5.11 MIL/uL   Hemoglobin 12.5  12.0 - 15.0 g/dL   HCT 38.0  36.0 - 46.0 %   MCV 66.3 (*) 78.0 - 100.0 fL   MCH 21.8 (*) 26.0 - 34.0 pg   MCHC 32.9  30.0 - 36.0 g/dL   RDW 17.7 (*) 11.5 - 15.5 %   Platelets 416 (*) 150 - 400 K/uL   Neutrophils Relative % 78 (*) 43 - 77 %   Lymphocytes Relative 14  12 - 46 %   Monocytes Relative 8  3 - 12 %   Eosinophils Relative 0  0 - 5 %   Basophils Relative 0  0 - 1 %   Neutro Abs 8.0 (*) 1.7 - 7.7 K/uL   Lymphs Abs 1.4  0.7 - 4.0 K/uL   Monocytes Absolute 0.8  0.1 - 1.0 K/uL   Eosinophils Absolute 0.0  0.0 - 0.7 K/uL   Basophils Absolute 0.0  0.0 - 0.1 K/uL   RBC Morphology ELLIPTOCYTES     Comment: TARGET CELLS   WBC Morphology ATYPICAL LYMPHOCYTES     COMPREHENSIVE METABOLIC PANEL     Status: Abnormal   Collection Time    02/02/13  1:50 AM      Result Value Range   Sodium 140  137 - 147 mEq/L  Potassium 3.2 (*) 3.7 - 5.3 mEq/L   Chloride 101  96 - 112 mEq/L   CO2 22  19 - 32 mEq/L   Glucose, Bld 119 (*) 70 - 99 mg/dL   BUN 16  6 - 23 mg/dL   Creatinine, Ser 1.10  0.50 - 1.10 mg/dL   Calcium 9.8  8.4 - 10.5 mg/dL   Total Protein 7.7  6.0 - 8.3 g/dL   Albumin 3.4 (*) 3.5 - 5.2 g/dL   AST 27  0 - 37 U/L   ALT 25  0 - 35 U/L   Alkaline Phosphatase 143 (*) 39 - 117 U/L   Total Bilirubin 0.3  0.3 - 1.2 mg/dL   GFR calc non Af Amer 52 (*) >90 mL/min   GFR calc Af Amer 61 (*) >90 mL/min   Comment: (NOTE)     The eGFR has been calculated using the CKD EPI equation.     This calculation has not been validated in all clinical situations.     eGFR's persistently <90 mL/min signify possible Chronic Kidney     Disease.  LIPASE, BLOOD     Status: None   Collection Time    02/02/13  1:50 AM      Result Value Range   Lipase 22  11 - 59 U/L  URINALYSIS, ROUTINE W REFLEX MICROSCOPIC     Status: Abnormal   Collection Time    02/02/13 10:05 AM      Result Value Range   Color, Urine YELLOW  YELLOW   APPearance CLEAR  CLEAR   Specific Gravity, Urine >1.046 (*) 1.005 - 1.030   pH 5.0  5.0 - 8.0   Glucose, UA NEGATIVE  NEGATIVE mg/dL   Hgb urine dipstick NEGATIVE  NEGATIVE   Bilirubin Urine NEGATIVE  NEGATIVE   Ketones, ur NEGATIVE  NEGATIVE mg/dL   Protein, ur NEGATIVE  NEGATIVE mg/dL   Urobilinogen, UA 0.2  0.0 - 1.0 mg/dL   Nitrite NEGATIVE  NEGATIVE   Leukocytes, UA NEGATIVE  NEGATIVE   Comment: MICROSCOPIC NOT DONE ON URINES WITH NEGATIVE PROTEIN, BLOOD, LEUKOCYTES, NITRITE, OR GLUCOSE <1000 mg/dL.    Ct Abdomen Pelvis W Contrast  02/02/2013   CLINICAL DATA:  63 year old female with abdominal and lower pelvic pain. Vomiting, chills, nausea. Initial encounter.  EXAM: CT ABDOMEN AND PELVIS WITH CONTRAST  TECHNIQUE: Multidetector CT  imaging of the abdomen and pelvis was performed using the standard protocol following bolus administration of intravenous contrast.  CONTRAST:  159m OMNIPAQUE IOHEXOL 300 MG/ML  SOLN  [facility]  FINDINGS: Negative lung bases.  No pericardial or pleural effusion.  No acute osseous abnormality identified. Degenerative changes in the lower lumbar spine including mild spondylolisthesis. Chronic posterior element fusion or ankylosis at the lowest lumbar levels.  Small volume of pelvic free fluid in the central lower pelvis. Uterus surgically absent. At adnexa surgically absent. Decompressed bladder and rectum.  Redundant but decompressed sigmoid colon. The left colon is decompressed. At the splenic flexure there is abrupt tapering of the colon and indistinctness of the colonic wall and lumen (series 2, images 31 -38 and coronal image 82). There are also small but conspicuous adjacent mesenteric lymph nodes individually measuring up to 8 mm short axis (series 2). The margins of this obstructing lesion are indistinct, but estimated size is 3-4 cm (perhaps as large as 6 cm longitudinally).  The upstream colon then is moderately to severely dilated with  gas and fluid (dilated redundant transverse colon up to 65 mm diameter, dilated ascending colon and cecum, the latter up to 81 mm diameter. Mildly prominent distal small bowel. No significantly dilated small bowel. Oral contrast in the stomach and proximal small bowel.  No liver lesion. The gallbladder is surgically absent with associated prominence of the intra and extrahepatic biliary tree. Negative spleen, pancreas and adrenal glands. Negative kidneys. Portal venous system is patent. Major arterial structures in the abdomen and pelvis are patent with no atherosclerosis identified. No abdominal free fluid.  IMPRESSION: 1. 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 Rough estimation of tumor size 3-4 cm. Colonoscopy likely would be the most appropriate next step in evaluation. 2. Obstructed, dilated proximal colon.  Decompressed distal colon. 3. No liver mass or distant metastatic disease identified.   Electronically Signed   By: Lars Pinks M.D.   On: 02/02/2013 09:23    Review of Systems  Constitutional: Positive for weight loss (16 pounds since August 2014). Negative for fever, chills, malaise/fatigue and diaphoresis.  HENT: Negative.   Eyes: Negative.   Respiratory: Positive for cough (dry cough). Negative for hemoptysis, sputum production, shortness of breath and wheezing.        Wears CPAP, she thinks it is at 9 cm/H2O  Cardiovascular: Positive for leg swelling. Negative for chest pain, palpitations, orthopnea, claudication and PND.  Gastrointestinal: Positive for heartburn (chronic, well controlled with PPI), nausea (last 2 days, no vomiting), abdominal pain (pain LLQ, most of the time. she says she also had pressure like discomfort mid abdomen, worse with being up and walking.) and blood in stool (seen on 12/31 by PCP office Dr. Ronnald Ramp  + stool occult). Negative for vomiting, diarrhea, constipation and melena.       She has been having stools, she describes them as being loose stools.  Fairly regular over weekend, small BM yesterday.  Genitourinary: Positive for dysuria. Negative for urgency, frequency, hematuria and flank pain.       Some odor and dark yellow color  Musculoskeletal: Positive for back pain (chronic back pain). Negative for falls, joint pain, myalgias and neck pain.  Skin: Negative.   Neurological: Negative.  Negative for weakness.  Endo/Heme/Allergies: Negative.   Psychiatric/Behavioral: Negative.    Blood pressure 111/65, pulse 77, temperature 98.3 F (36.8 C), temperature source Oral, resp. rate 16, SpO2 96.00%. Physical Exam   Constitutional: She is oriented to person, place, and time. She appears well-developed and well-nourished. No distress.  Obese female, some discomfort.  HENT:  Head: Normocephalic and atraumatic.  Nose: Nose normal.  Eyes: Conjunctivae and EOM are normal. Pupils are equal, round, and reactive to light. Right eye exhibits no discharge. Left eye exhibits no discharge. No scleral icterus.  Neck: Normal range of motion. Neck supple. No JVD present. No tracheal deviation present. No thyromegaly present.  Cardiovascular: Normal rate, regular rhythm, normal heart sounds and intact distal pulses.  Exam reveals no gallop.   No murmur heard. Respiratory: Effort normal and breath sounds normal. No respiratory distress. She has no wheezes. She has no rales. She exhibits no tenderness.  GI: Soft. Bowel sounds are normal. She exhibits distension (mild distension). She exhibits no mass. There is tenderness (she is tender to palpation, but not much, no peritonitis like pain.  her LLQ pain is OK right now.). There is no rebound and no guarding.  Musculoskeletal: She exhibits  no edema.  Lymphadenopathy:    She has no cervical adenopathy.  Neurological: She is alert and oriented to person, place, and time. No cranial nerve deficit.  Skin: Skin is warm and dry. No rash noted. She is not diaphoretic. No erythema. No pallor.  Psychiatric: She has a normal mood and affect. Her behavior is normal. Judgment and thought content normal.    Assessment/Plan: 1.  Colon mass with partial obstruction (still having loose BM)  Stool Occult positive 01/27/13. 2.  Hypertension 3.  Chronic back pain on disability 4.  Obesity with weight loss 5.  GERD/hx of colon polyp, last colonoscopy 2006 (Dr. Carlean Purl ?) 6.  OSA on CPAP followed by Dr. Keturah Barre 7.  S/p  Appendectomy, cholecystectomy and abdominal hysterectomy  Plan:  She needs medicine evaluation and GI will want to do a colonoscopy.  We would then plan resection of  the mass after more information is available. It appears she is still having BM's.  If she can get a good prep she may be able to be done in a single stage procedure.  We will follow with you and Dr. Rosendo Gros will see later today.  Brit Carbonell 02/02/2013, 12:37 PM
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
## 2013-02-03 ENCOUNTER — Encounter (HOSPITAL_COMMUNITY)
Admission: EM | Disposition: A | Discharge status: Skilled Nursing Facility | Payer: Self-pay | Source: Home / Self Care | Attending: Internal Medicine

## 2013-02-03 ENCOUNTER — Encounter (HOSPITAL_COMMUNITY): Payer: Self-pay | Admitting: *Deleted

## 2013-02-03 ENCOUNTER — Other Ambulatory Visit: Payer: Medicare Other

## 2013-02-03 DIAGNOSIS — K6389 Other specified diseases of intestine: Secondary | ICD-10-CM | POA: Diagnosis not present

## 2013-02-03 DIAGNOSIS — K56609 Unspecified intestinal obstruction, unspecified as to partial versus complete obstruction: Secondary | ICD-10-CM | POA: Diagnosis not present

## 2013-02-03 DIAGNOSIS — R634 Abnormal weight loss: Secondary | ICD-10-CM | POA: Diagnosis present

## 2013-02-03 DIAGNOSIS — C185 Malignant neoplasm of splenic flexure: Secondary | ICD-10-CM | POA: Diagnosis not present

## 2013-02-03 DIAGNOSIS — N179 Acute kidney failure, unspecified: Secondary | ICD-10-CM | POA: Diagnosis not present

## 2013-02-03 DIAGNOSIS — G4733 Obstructive sleep apnea (adult) (pediatric): Secondary | ICD-10-CM

## 2013-02-03 DIAGNOSIS — R1084 Generalized abdominal pain: Secondary | ICD-10-CM | POA: Diagnosis not present

## 2013-02-03 DIAGNOSIS — D490 Neoplasm of unspecified behavior of digestive system: Secondary | ICD-10-CM | POA: Diagnosis not present

## 2013-02-03 DIAGNOSIS — R109 Unspecified abdominal pain: Secondary | ICD-10-CM

## 2013-02-03 DIAGNOSIS — E43 Unspecified severe protein-calorie malnutrition: Secondary | ICD-10-CM | POA: Diagnosis not present

## 2013-02-03 DIAGNOSIS — R579 Shock, unspecified: Secondary | ICD-10-CM | POA: Diagnosis not present

## 2013-02-03 DIAGNOSIS — R112 Nausea with vomiting, unspecified: Secondary | ICD-10-CM

## 2013-02-03 DIAGNOSIS — K219 Gastro-esophageal reflux disease without esophagitis: Secondary | ICD-10-CM | POA: Diagnosis not present

## 2013-02-03 HISTORY — PX: COLONOSCOPY: SHX5424

## 2013-02-03 LAB — CBC WITH DIFFERENTIAL/PLATELET
Basophils Absolute: 0 10*3/uL (ref 0.0–0.1)
Basophils Relative: 0 % (ref 0–1)
EOS PCT: 0 % (ref 0–5)
Eosinophils Absolute: 0 10*3/uL (ref 0.0–0.7)
HCT: 33.6 % — ABNORMAL LOW (ref 36.0–46.0)
Hemoglobin: 10.5 g/dL — ABNORMAL LOW (ref 12.0–15.0)
Lymphocytes Relative: 19 % (ref 12–46)
Lymphs Abs: 1.8 10*3/uL (ref 0.7–4.0)
MCH: 21 pg — AB (ref 26.0–34.0)
MCHC: 31.3 g/dL (ref 30.0–36.0)
MCV: 67.3 fL — ABNORMAL LOW (ref 78.0–100.0)
Monocytes Absolute: 1.2 10*3/uL — ABNORMAL HIGH (ref 0.1–1.0)
Monocytes Relative: 12 % (ref 3–12)
NEUTROS PCT: 69 % (ref 43–77)
Neutro Abs: 6.7 10*3/uL (ref 1.7–7.7)
Platelets: 386 10*3/uL (ref 150–400)
RBC: 4.99 MIL/uL (ref 3.87–5.11)
RDW: 17.9 % — AB (ref 11.5–15.5)
WBC: 9.7 10*3/uL (ref 4.0–10.5)

## 2013-02-03 LAB — COMPREHENSIVE METABOLIC PANEL
ALT: 18 U/L (ref 0–35)
AST: 17 U/L (ref 0–37)
Albumin: 2.7 g/dL — ABNORMAL LOW (ref 3.5–5.2)
Alkaline Phosphatase: 111 U/L (ref 39–117)
BILIRUBIN TOTAL: 0.2 mg/dL — AB (ref 0.3–1.2)
BUN: 16 mg/dL (ref 6–23)
CHLORIDE: 105 meq/L (ref 96–112)
CO2: 24 meq/L (ref 19–32)
CREATININE: 1.17 mg/dL — AB (ref 0.50–1.10)
Calcium: 9.1 mg/dL (ref 8.4–10.5)
GFR, EST AFRICAN AMERICAN: 56 mL/min — AB (ref >90)
GFR, EST NON AFRICAN AMERICAN: 49 mL/min — AB (ref >90)
Glucose, Bld: 115 mg/dL — ABNORMAL HIGH (ref 70–99)
Potassium: 3.7 mEq/L (ref 3.7–5.3)
Sodium: 142 mEq/L (ref 137–147)
Total Protein: 6.3 g/dL (ref 6.0–8.3)

## 2013-02-03 LAB — ABO/RH: ABO/RH(D): A NEG

## 2013-02-03 LAB — TYPE AND SCREEN
ABO/RH(D): A NEG
ANTIBODY SCREEN: NEGATIVE

## 2013-02-03 LAB — MAGNESIUM: Magnesium: 2.2 mg/dL (ref 1.5–2.5)

## 2013-02-03 SURGERY — COLONOSCOPY
Anesthesia: Moderate Sedation

## 2013-02-03 MED ORDER — FENTANYL CITRATE 0.05 MG/ML IJ SOLN
INTRAMUSCULAR | Status: DC | PRN
  Administered 2013-02-03 (×3): 25 ug via INTRAVENOUS

## 2013-02-03 MED ORDER — PNEUMOCOCCAL VAC POLYVALENT 25 MCG/0.5ML IJ INJ
0.5000 mL | INJECTION | INTRAMUSCULAR | Status: DC
  Filled 2013-02-03: qty 0.5

## 2013-02-03 MED ORDER — FLEET ENEMA 7-19 GM/118ML RE ENEM
1.0000 | ENEMA | Freq: Two times a day (BID) | RECTAL | Status: AC
  Administered 2013-02-03 (×2): 1 via RECTAL
  Filled 2013-02-03: qty 1

## 2013-02-03 MED ORDER — FENTANYL CITRATE 0.05 MG/ML IJ SOLN
INTRAMUSCULAR | Status: AC
  Filled 2013-02-03: qty 2

## 2013-02-03 MED ORDER — MIDAZOLAM HCL 5 MG/ML IJ SOLN
INTRAMUSCULAR | Status: AC
  Filled 2013-02-03: qty 2

## 2013-02-03 MED ORDER — MIDAZOLAM HCL 5 MG/5ML IJ SOLN
INTRAMUSCULAR | Status: DC | PRN
  Administered 2013-02-03 (×2): 1 mg via INTRAVENOUS
  Administered 2013-02-03: 2 mg via INTRAVENOUS

## 2013-02-03 NOTE — Progress Notes (Signed)
I have seen and examined the pt and agree with NP-Riebock's progress note. Colonoscopy results noted. I discussed the results the pt and the need for surgery. Consented patient for colon resection on Thurs.

## 2013-02-03 NOTE — Progress Notes (Signed)
Subjective: Pt unable to tolerate prep, nausea and vomiting.  Just had an enema with some result.  Plan for flex sig today.    Objective: Vital signs in last 24 hours: Temp:  [97.8 F (36.6 C)-98.5 F (36.9 C)] 97.8 F (36.6 C) (01/07 0446) Pulse Rate:  [57-92] 57 (01/07 0649) Resp:  [16-18] 18 (01/07 0446) BP: (95-139)/(58-83) 106/71 mmHg (01/07 0649) SpO2:  [91 %-100 %] 94 % (01/07 0446) Weight:  [235 lb 0.2 oz (106.6 kg)] 235 lb 0.2 oz (106.6 kg) (01/07 0446)    Intake/Output from previous day: 01/06 0701 - 01/07 0700 In: 1135 [I.V.:1135] Out: -  Intake/Output this shift: Total I/O In: 135 [I.V.:135] Out: 800 [Emesis/NG output:800]  PE General appearance: alert, cooperative and no distress GI: +bs abdomen is soft, ttp epigastric region and llq.    Lab Results:   Recent Labs  02/02/13 2054 02/03/13 0428  WBC 11.2* 9.7  HGB 12.2 10.5*  HCT 38.0 33.6*  PLT 415* 386   BMET  Recent Labs  02/02/13 0150 02/02/13 1516 02/03/13 0428  NA 140  --  142  K 3.2*  --  3.7  CL 101  --  105  CO2 22  --  24  GLUCOSE 119*  --  115*  BUN 16  --  16  CREATININE 1.10 1.08 1.17*  CALCIUM 9.8  --  9.1   PT/INR  Recent Labs  02/02/13 2054  LABPROT 13.7  INR 1.07   ABG No results found for this basename: PHART, PCO2, PO2, HCO3,  in the last 72 hours  Studies/Results: Ct Abdomen Pelvis W Contrast  02/02/2013   CLINICAL DATA:  63 year old female with abdominal and lower pelvic pain. Vomiting, chills, nausea. Initial encounter.  EXAM: CT ABDOMEN AND PELVIS WITH CONTRAST  TECHNIQUE: Multidetector CT imaging of the abdomen and pelvis was performed using the standard protocol following bolus administration of intravenous contrast.  CONTRAST:  123mL OMNIPAQUE IOHEXOL 300 MG/ML  SOLN  COMPARISON:  [facility] DG ABD ACUTE W/CHEST dated 01/27/2013  FINDINGS: Negative lung bases.  No pericardial or pleural effusion.  No acute osseous abnormality identified. Degenerative  changes in the lower lumbar spine including mild spondylolisthesis. Chronic posterior element fusion or ankylosis at the lowest lumbar levels.  Small volume of pelvic free fluid in the central lower pelvis. Uterus surgically absent. At adnexa surgically absent. Decompressed bladder and rectum.  Redundant but decompressed sigmoid colon. The left colon is decompressed. At the splenic flexure there is abrupt tapering of the colon and indistinctness of the colonic wall and lumen (series 2, images 31 -38 and coronal image 82). There are also small but conspicuous adjacent mesenteric lymph nodes individually measuring up to 8 mm short axis (series 2). The margins of this obstructing lesion are indistinct, but estimated size is 3-4 cm (perhaps as large as 6 cm longitudinally).  The upstream colon then is moderately to severely dilated with gas and fluid (dilated redundant transverse colon up to 65 mm diameter, dilated ascending colon and cecum, the latter up to 81 mm diameter. Mildly prominent distal small bowel. No significantly dilated small bowel. Oral contrast in the stomach and proximal small bowel.  No liver lesion. The gallbladder is surgically absent with associated prominence of the intra and extrahepatic biliary tree. Negative spleen, pancreas and adrenal glands. Negative kidneys. Portal venous system is patent. Major arterial structures in the abdomen and pelvis are patent with no atherosclerosis identified. No abdominal free fluid.  IMPRESSION: 1. 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 Rough estimation of tumor size 3-4 cm. Colonoscopy likely would be the most appropriate next step in evaluation. 2. Obstructed, dilated proximal colon.  Decompressed distal colon. 3. No liver mass or distant metastatic disease identified.   Electronically Signed   By: Lars Pinks M.D.   On:  02/02/2013 09:23    Anti-infectives: Anti-infectives   None      Assessment/Plan: 1. Colon mass with obstruction.  Stool Occult positive 01/27/13.  2. Hypertension  3. Chronic back pain on disability  4. Obesity with weight loss  5. GERD/hx of colon polyp, last colonoscopy 2006 (Dr. Carlean Purl ?)  6. OSA on CPAP followed by Dr. Keturah Barre  7. S/p Appendectomy, cholecystectomy and abdominal hysterectomy  Plan: flex sig with GI today. Leave NPO after procedure for probable left colectomy tomorrow morning with Dr. Georgette Dover.  Discussed with the patient.  CEA <5   LOS: 1 day    [name] ANP-BC 02/03/2013 11:31 AM

## 2013-02-03 NOTE — Progress Notes (Signed)
She vomited the prep, no stool output.  Failing prep likely due to colonic obstruction.  I discussed with surgery, we agreed to proceed with flex sig to at least check for distal pathology prior to surgery.  She however initially declined enemas necessary for flex sig but now in room with hospitalist she agrees. Will plan for 2 enemas now, flex sig when ready.   IF she vomits again, NGtube should be placed.
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
## 2013-02-03 NOTE — Progress Notes (Signed)
[facility] PROGRESS NOTE  DRU LAUREL YTK:[id number] DOB: 1950/02/01 DOA: 02/02/2013 PCP: Walker Kehr, MD  HPI/Subjective: Had some vomiting yesterday with bowel prep. Discussed with presence of Dr. Ardis Hughs, patient is okay with flex sig.  Assessment/Plan: Active Problems:   OBSTRUCTIVE SLEEP APNEA   HYPERTENSION   Anemia, iron deficiency   Colon obstruction   Hypokalemia   Generalized abdominal pain   Colonic mass   GERD (gastroesophageal reflux disease)   Asthma   Abnormal weight loss   Colon mass -With partial obstruction, patient still has loose stools. -CT scan suggested malignancy. -Flexible sigmoidoscopy done today by Dr. Ardis Hughs, biopsies pending. -Plan is noted from general surgery for partial hemicolectomy in the morning. -If patient undergone partial hemicolectomy, I will last general surgery to take over as attending.  Anemia -Microcytic anemia likely secondary to the above-mentioned colon mass. -Iron is 21 and ferritin is 13 consistent with iron deficiency anemia likely secondary to chronic blood loss.  Hypertension -Continue home medications.  Chronic back pain -Patient is on chronic narcotics for chronic pain. -Continue, avoid interruption as patient might develop withdrawal symptoms.  OSA -Continue CPAP  Code Status: Full code Family Communication: Plan discussed with the patient. Disposition Plan: Remains inpatient   Consultants:  General surgery  Gastroenterology  Procedures:  Not  Antibiotics:  None   Objective: Filed Vitals:   02/03/13 1512  BP: 133/86  Pulse: 92  Temp: 98.3 F (36.8 C)  Resp: 18    Intake/Output Summary (Last 24 hours) at 02/03/13 1545 Last data filed at 02/03/13 1532  Gross per 24 hour  Intake    270 ml  Output    800 ml  Net   -530 ml   Filed Weights   02/02/13 2037 02/03/13 0446  Weight: 106.6 kg (235 lb 0.2 oz) 106.6 kg (235 lb 0.2 oz)    Exam: General: Alert and awake,  oriented x3, not in any acute distress. HEENT: anicteric sclera, pupils reactive to light and accommodation, EOMI CVS: S1-S2 clear, no murmur rubs or gallops Chest: clear to auscultation bilaterally, no wheezing, rales or rhonchi Abdomen: soft nontender, nondistended, normal bowel sounds, no organomegaly Extremities: no cyanosis, clubbing or edema noted bilaterally Neuro: Cranial nerves II-XII intact, no focal neurological deficits  Data Reviewed: Basic Metabolic Panel:  Recent Labs Lab 02/02/13 0150 02/02/13 1516 02/03/13 0428  NA 140  --  142  K 3.2*  --  3.7  CL 101  --  105  CO2 22  --  24  GLUCOSE 119*  --  115*  BUN 16  --  16  CREATININE 1.10 1.08 1.17*  CALCIUM 9.8  --  9.1  MG  --   --  2.2   Liver Function Tests:  Recent Labs Lab 02/02/13 0150 02/03/13 0428  AST 27 17  ALT 25 18  ALKPHOS 143* 111  BILITOT 0.3 0.2*  PROT 7.7 6.3  ALBUMIN 3.4* 2.7*    Recent Labs Lab 02/02/13 0150  LIPASE 22   No results found for this basename: AMMONIA,  in the last 168 hours CBC:  Recent Labs Lab 02/02/13 0150 02/02/13 2054 02/03/13 0428  WBC 10.2 11.2* 9.7  NEUTROABS 8.0*  --  6.7  HGB 12.5 12.2 10.5*  HCT 38.0 38.0 33.6*  MCV 66.3* 67.3* 67.3*  PLT 416* 415* 386   Cardiac Enzymes: No results found for this basename: CKTOTAL, CKMB, CKMBINDEX, TROPONINI,  in the last 168 hours BNP (last 3 results) No results found  for this basename: PROBNP,  in the last 8760 hours CBG: No results found for this basename: GLUCAP,  in the last 168 hours  Micro No results found for this or any previous visit (from the past 240 hour(s)).   Studies: Ct Abdomen Pelvis W Contrast  02/02/2013   CLINICAL DATA:  63 year old female with abdominal and lower pelvic pain. Vomiting, chills, nausea. Initial encounter.  EXAM: CT ABDOMEN AND PELVIS WITH CONTRAST  TECHNIQUE: Multidetector CT imaging of the abdomen and pelvis was performed using the standard protocol following bolus  administration of intravenous contrast.  CONTRAST:  159mL OMNIPAQUE IOHEXOL 300 MG/ML  SOLN  COMPARISON:  [facility] DG ABD ACUTE W/CHEST dated 01/27/2013  FINDINGS: Negative lung bases.  No pericardial or pleural effusion.  No acute osseous abnormality identified. Degenerative changes in the lower lumbar spine including mild spondylolisthesis. Chronic posterior element fusion or ankylosis at the lowest lumbar levels.  Small volume of pelvic free fluid in the central lower pelvis. Uterus surgically absent. At adnexa surgically absent. Decompressed bladder and rectum.  Redundant but decompressed sigmoid colon. The left colon is decompressed. At the splenic flexure there is abrupt tapering of the colon and indistinctness of the colonic wall and lumen (series 2, images 31 -38 and coronal image 82). There are also small but conspicuous adjacent mesenteric lymph nodes individually measuring up to 8 mm short axis (series 2). The margins of this obstructing lesion are indistinct, but estimated size is 3-4 cm (perhaps as large as 6 cm longitudinally).  The upstream colon then is moderately to severely dilated with gas and fluid (dilated redundant transverse colon up to 65 mm diameter, dilated ascending colon and cecum, the latter up to 81 mm diameter. Mildly prominent distal small bowel. No significantly dilated small bowel. Oral contrast in the stomach and proximal small bowel.  No liver lesion. The gallbladder is surgically absent with associated prominence of the intra and extrahepatic biliary tree. Negative spleen, pancreas and adrenal glands. Negative kidneys. Portal venous system is patent. Major arterial structures in the abdomen and pelvis are patent with no atherosclerosis identified. No abdominal free fluid.  IMPRESSION: 1. 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 Rough  estimation of tumor size 3-4 cm. Colonoscopy likely would be the most appropriate next step in evaluation. 2. Obstructed, dilated proximal colon.  Decompressed distal colon. 3. No liver mass or distant metastatic disease identified.   Electronically Signed   By: Lars Pinks M.D.   On: 02/02/2013 09:23    Scheduled Meds: . aspirin  81 mg Oral Daily  . enalaprilat  0.625 mg Intravenous Q6H  . mometasone-formoterol  2 puff Inhalation BID  . montelukast  10 mg Oral QHS  . pantoprazole (PROTONIX) IV  40 mg Intravenous Q24H  . pneumococcal 23 valent vaccine  0.5 mL Intramuscular Tomorrow-1000   Continuous Infusions: . dextrose 5 % and 0.45 % NaCl with KCl 20 mEq/L 1,000 mL (02/03/13 1341)       Time spent: 35 minutes    [facility]  [facility] Pager [phone] If 7PM-7AM, please contact night-coverage at [email or web address], password [facility] 02/03/2013, 3:45 PM  LOS: 1 day

## 2013-02-03 NOTE — ED Provider Notes (Signed)
Medical screening examination/treatment/procedure(s) were performed by non-physician practitioner and as supervising physician I was immediately available for consultation/collaboration.   Julianne Rice, MD 02/03/13 [phone]

## 2013-02-03 NOTE — Progress Notes (Signed)
UR completed. Login Muckleroy RN CCM Case Mgmt 

## 2013-02-03 NOTE — Interval H&P Note (Signed)
History and Physical Interval Note:  02/03/2013 12:21 PM  [name]  has presented today for surgery, with the diagnosis of colon mass  The various methods of treatment have been discussed with the patient and family. After consideration of risks, benefits and other options for treatment, the patient has consented to  Procedure(s): COLONOSCOPY (N/A) as a surgical intervention .  The patient's history has been reviewed, patient examined, no change in status, stable for surgery.  I have reviewed the patient's chart and labs.  Questions were answered to the patient's satisfaction.     Milus Banister

## 2013-02-03 NOTE — Op Note (Signed)
[facility] [place], [id number]   FLEXIBLE SIGMOIDOSCOPY PROCEDURE REPORT  PATIENT: [name], [name]  MR#: [id number] BIRTHDATE: 1950-03-15 , 66  yrs. old GENDER: Female ENDOSCOPIST: Milus Banister, MD REFERRED BY: [facility] PROCEDURE DATE:  02/03/2013 PROCEDURE:   Sigmoidoscopy with biopsy ASA CLASS:   Class III INDICATIONS:colonic obstruction. MEDICATIONS: Fentanyl 50 mcg IV and Versed 4 mg IV  DESCRIPTION OF PROCEDURE:   After the risks benefits and alternatives of the procedure were thoroughly explained, informed consent was obtained.  revealed no abnormalities of the rectum. The EC-[id number] ([id number])  endoscope was introduced through the anus  and advanced to the splenic flexure , limited by No adverse events experienced.   The quality of the prep was adequate .  The instrument was then slowly withdrawn as the mucosa was fully examined.      COLON FINDINGS: In the region of the splenic flexure there was a large, friable, fungating tumor that is completely obstructing the colon lumen.  I could not find even a pin-point lumen.  The large mass was biopsied and sent to pathology.  The colon distal to the tumor was otherwise normal. Retroflexed views revealed no abnormalities.    The scope was then withdrawn from the patient and the procedure terminated.  COMPLICATIONS: There were no complications.  ENDOSCOPIC IMPRESSION: In the region of the splenic flexure there was a large, friable, fungating tumor that is completely obstructing the colon lumen.  I could not find even a pin-point lumen.  The large mass was biopsied and sent to pathology.  The colon distal to the tumor was otherwise normal.  RECOMMENDATIONS: Surgical resection, timing per general surgery team.  If she vomits today, I suggest NG tube placement. She will need full colonoscopy in next 6-12 months to clear her remaining colon.  Timing will depend on  oncologic care, treatment.   _______________________________ eSigned:  Milus Banister, MD 02/03/2013 12:54 PM   CC:  Silvano Rusk, MD

## 2013-02-03 NOTE — Progress Notes (Signed)
Pt admitted to unit, room 5w10. Pt alert, oriented, and pleasant. Skin is intact. No reports of nausea or vomiting. Pt oriented to room and unit, call bell placed within reach. Will continue to monitor pt per MD orders.
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
## 2013-02-04 ENCOUNTER — Encounter (HOSPITAL_COMMUNITY): Payer: Self-pay | Admitting: Anesthesiology

## 2013-02-04 ENCOUNTER — Inpatient Hospital Stay (HOSPITAL_COMMUNITY): Payer: Medicare Other | Admitting: Anesthesiology

## 2013-02-04 ENCOUNTER — Encounter (HOSPITAL_COMMUNITY): Payer: Medicare Other | Admitting: Anesthesiology

## 2013-02-04 ENCOUNTER — Encounter (HOSPITAL_COMMUNITY)
Admission: EM | Disposition: A | Discharge status: Skilled Nursing Facility | Payer: Self-pay | Source: Home / Self Care | Attending: Internal Medicine

## 2013-02-04 DIAGNOSIS — R579 Shock, unspecified: Secondary | ICD-10-CM | POA: Diagnosis not present

## 2013-02-04 DIAGNOSIS — C185 Malignant neoplasm of splenic flexure: Secondary | ICD-10-CM | POA: Diagnosis not present

## 2013-02-04 DIAGNOSIS — R109 Unspecified abdominal pain: Secondary | ICD-10-CM | POA: Diagnosis not present

## 2013-02-04 DIAGNOSIS — D371 Neoplasm of uncertain behavior of stomach: Secondary | ICD-10-CM | POA: Diagnosis not present

## 2013-02-04 DIAGNOSIS — E43 Unspecified severe protein-calorie malnutrition: Secondary | ICD-10-CM | POA: Insufficient documentation

## 2013-02-04 DIAGNOSIS — K5733 Diverticulitis of large intestine without perforation or abscess with bleeding: Secondary | ICD-10-CM | POA: Diagnosis not present

## 2013-02-04 DIAGNOSIS — M549 Dorsalgia, unspecified: Secondary | ICD-10-CM | POA: Diagnosis not present

## 2013-02-04 DIAGNOSIS — C19 Malignant neoplasm of rectosigmoid junction: Secondary | ICD-10-CM | POA: Diagnosis not present

## 2013-02-04 DIAGNOSIS — K56609 Unspecified intestinal obstruction, unspecified as to partial versus complete obstruction: Secondary | ICD-10-CM | POA: Diagnosis not present

## 2013-02-04 DIAGNOSIS — G4710 Hypersomnia, unspecified: Secondary | ICD-10-CM | POA: Diagnosis not present

## 2013-02-04 DIAGNOSIS — N179 Acute kidney failure, unspecified: Secondary | ICD-10-CM | POA: Diagnosis not present

## 2013-02-04 DIAGNOSIS — K6389 Other specified diseases of intestine: Secondary | ICD-10-CM | POA: Diagnosis not present

## 2013-02-04 HISTORY — PX: COLOSTOMY: SHX63

## 2013-02-04 HISTORY — PX: COLON RESECTION: SHX5231

## 2013-02-04 LAB — SURGICAL PCR SCREEN
MRSA, PCR: NEGATIVE
Staphylococcus aureus: NEGATIVE

## 2013-02-04 LAB — CBC
HCT: 37.8 % (ref 36.0–46.0)
Hemoglobin: 12.1 g/dL (ref 12.0–15.0)
MCH: 21.6 pg — ABNORMAL LOW (ref 26.0–34.0)
MCHC: 32 g/dL (ref 30.0–36.0)
MCV: 67.6 fL — AB (ref 78.0–100.0)
PLATELETS: 384 10*3/uL (ref 150–400)
RBC: 5.59 MIL/uL — ABNORMAL HIGH (ref 3.87–5.11)
RDW: 18.2 % — ABNORMAL HIGH (ref 11.5–15.5)
WBC: 15.9 10*3/uL — ABNORMAL HIGH (ref 4.0–10.5)

## 2013-02-04 LAB — CREATININE, SERUM
CREATININE: 1.12 mg/dL — AB (ref 0.50–1.10)
GFR calc Af Amer: 59 mL/min — ABNORMAL LOW (ref >90)
GFR, EST NON AFRICAN AMERICAN: 51 mL/min — AB (ref >90)

## 2013-02-04 SURGERY — COLON RESECTION
Anesthesia: General | Site: Abdomen | Laterality: Right

## 2013-02-04 MED ORDER — MIDAZOLAM HCL 5 MG/5ML IJ SOLN
INTRAMUSCULAR | Status: DC | PRN
  Administered 2013-02-04 (×2): 1 mg via INTRAVENOUS

## 2013-02-04 MED ORDER — HYDROMORPHONE HCL PF 1 MG/ML IJ SOLN
INTRAMUSCULAR | Status: AC
  Filled 2013-02-04: qty 1

## 2013-02-04 MED ORDER — MORPHINE SULFATE (PF) 1 MG/ML IV SOLN
INTRAVENOUS | Status: AC
  Filled 2013-02-04: qty 25

## 2013-02-04 MED ORDER — DEXTROSE 5 % IV SOLN
1.0000 g | Freq: Two times a day (BID) | INTRAVENOUS | Status: DC
  Administered 2013-02-04: 1 g via INTRAVENOUS
  Filled 2013-02-04 (×2): qty 1

## 2013-02-04 MED ORDER — HYDROMORPHONE HCL PF 1 MG/ML IJ SOLN
0.2500 mg | INTRAMUSCULAR | Status: DC | PRN
  Administered 2013-02-04 (×4): 0.5 mg via INTRAVENOUS

## 2013-02-04 MED ORDER — DIPHENHYDRAMINE HCL 50 MG/ML IJ SOLN: 12.5000 mg | Freq: Four times a day (QID) | INTRAMUSCULAR | Status: DC | PRN

## 2013-02-04 MED ORDER — DIPHENHYDRAMINE HCL 12.5 MG/5ML PO ELIX
12.5000 mg | ORAL_SOLUTION | Freq: Four times a day (QID) | ORAL | Status: DC | PRN
  Filled 2013-02-04: qty 5

## 2013-02-04 MED ORDER — PHENYLEPHRINE HCL 10 MG/ML IJ SOLN
10.0000 mg | INTRAMUSCULAR | Status: DC | PRN
  Administered 2013-02-04: 80 ug/min via INTRAVENOUS

## 2013-02-04 MED ORDER — SODIUM CHLORIDE 0.9 % IV BOLUS (SEPSIS)
500.0000 mL | Freq: Once | INTRAVENOUS | Status: AC
  Administered 2013-02-04: 22:00:00 500 mL via INTRAVENOUS

## 2013-02-04 MED ORDER — ENOXAPARIN SODIUM 40 MG/0.4ML ~~LOC~~ SOLN
40.0000 mg | SUBCUTANEOUS | Status: DC
  Administered 2013-02-04 – 2013-02-17 (×13): 40 mg via SUBCUTANEOUS
  Filled 2013-02-04 (×17): qty 0.4

## 2013-02-04 MED ORDER — SUCCINYLCHOLINE CHLORIDE 20 MG/ML IJ SOLN
INTRAMUSCULAR | Status: DC | PRN
  Administered 2013-02-04: 120 mg via INTRAVENOUS

## 2013-02-04 MED ORDER — SODIUM CHLORIDE 0.9 % IJ SOLN: 9.0000 mL | INTRAMUSCULAR | Status: DC | PRN

## 2013-02-04 MED ORDER — FENTANYL CITRATE 0.05 MG/ML IJ SOLN
INTRAMUSCULAR | Status: DC | PRN
  Administered 2013-02-04 (×2): 100 ug via INTRAVENOUS
  Administered 2013-02-04: 50 ug via INTRAVENOUS

## 2013-02-04 MED ORDER — ALVIMOPAN 12 MG PO CAPS
12.0000 mg | ORAL_CAPSULE | Freq: Two times a day (BID) | ORAL | Status: DC
  Filled 2013-02-04 (×5): qty 1

## 2013-02-04 MED ORDER — ARTIFICIAL TEARS OP OINT
TOPICAL_OINTMENT | OPHTHALMIC | Status: DC | PRN
  Administered 2013-02-04: 1 via OPHTHALMIC

## 2013-02-04 MED ORDER — NEOSTIGMINE METHYLSULFATE 1 MG/ML IJ SOLN
INTRAMUSCULAR | Status: DC | PRN
  Administered 2013-02-04: 5 mg via INTRAVENOUS

## 2013-02-04 MED ORDER — 0.9 % SODIUM CHLORIDE (POUR BTL) OPTIME
TOPICAL | Status: DC | PRN
  Administered 2013-02-04 (×4): 1000 mL

## 2013-02-04 MED ORDER — ONDANSETRON HCL 4 MG/2ML IJ SOLN: INTRAMUSCULAR | Status: DC | PRN

## 2013-02-04 MED ORDER — DEXTROSE 5 % IV SOLN
2.0000 g | INTRAVENOUS | Status: AC
  Administered 2013-02-04: 2 g via INTRAVENOUS
  Filled 2013-02-04: qty 2

## 2013-02-04 MED ORDER — ONDANSETRON HCL 4 MG PO TABS: 4.0000 mg | ORAL_TABLET | Freq: Four times a day (QID) | ORAL | Status: DC | PRN

## 2013-02-04 MED ORDER — OXYCODONE HCL 5 MG PO TABS: 5.0000 mg | ORAL_TABLET | Freq: Once | ORAL | Status: DC | PRN

## 2013-02-04 MED ORDER — ALVIMOPAN 12 MG PO CAPS
12.0000 mg | ORAL_CAPSULE | Freq: Once | ORAL | Status: DC
  Filled 2013-02-04: qty 1

## 2013-02-04 MED ORDER — LIDOCAINE HCL (CARDIAC) 20 MG/ML IV SOLN
INTRAVENOUS | Status: DC | PRN
  Administered 2013-02-04: 100 mg via INTRAVENOUS

## 2013-02-04 MED ORDER — LACTATED RINGERS IV SOLN
INTRAVENOUS | Status: DC | PRN
  Administered 2013-02-04 (×3): via INTRAVENOUS

## 2013-02-04 MED ORDER — LACTATED RINGERS IV SOLN
INTRAVENOUS | Status: DC
  Administered 2013-02-04: 10:00:00 via INTRAVENOUS

## 2013-02-04 MED ORDER — OXYCODONE HCL 5 MG/5ML PO SOLN: 5.0000 mg | Freq: Once | ORAL | Status: DC | PRN

## 2013-02-04 MED ORDER — ONDANSETRON HCL 4 MG/2ML IJ SOLN: 4.0000 mg | Freq: Four times a day (QID) | INTRAMUSCULAR | Status: DC | PRN

## 2013-02-04 MED ORDER — ALBUMIN HUMAN 5 % IV SOLN
INTRAVENOUS | Status: DC | PRN
  Administered 2013-02-04 (×2): via INTRAVENOUS

## 2013-02-04 MED ORDER — ONDANSETRON HCL 4 MG/2ML IJ SOLN
INTRAMUSCULAR | Status: DC | PRN
Start: 2013-02-04 — End: 2013-02-04
  Administered 2013-02-04: 4 mg via INTRAVENOUS

## 2013-02-04 MED ORDER — PROPOFOL 10 MG/ML IV BOLUS
INTRAVENOUS | Status: DC | PRN
  Administered 2013-02-04: 50 mg via INTRAVENOUS
  Administered 2013-02-04: 150 mg via INTRAVENOUS

## 2013-02-04 MED ORDER — ONDANSETRON HCL 4 MG/2ML IJ SOLN
INTRAMUSCULAR | Status: AC
  Filled 2013-02-04: qty 2

## 2013-02-04 MED ORDER — NALOXONE HCL 0.4 MG/ML IJ SOLN: 0.4000 mg | INTRAMUSCULAR | Status: DC | PRN

## 2013-02-04 MED ORDER — DEXTROSE 5 % IV SOLN
INTRAVENOUS | Status: DC | PRN
  Administered 2013-02-04: 11:00:00 via INTRAVENOUS

## 2013-02-04 MED ORDER — GLYCOPYRROLATE 0.2 MG/ML IJ SOLN
INTRAMUSCULAR | Status: DC | PRN
  Administered 2013-02-04: .6 mg via INTRAVENOUS

## 2013-02-04 MED ORDER — MORPHINE SULFATE (PF) 1 MG/ML IV SOLN
INTRAVENOUS | Status: DC
  Administered 2013-02-04: 9 mg via INTRAVENOUS
  Administered 2013-02-04: 4.5 mg via INTRAVENOUS
  Administered 2013-02-04: 14:00:00 via INTRAVENOUS

## 2013-02-04 MED ORDER — ROCURONIUM BROMIDE 100 MG/10ML IV SOLN
INTRAVENOUS | Status: DC | PRN
  Administered 2013-02-04: 20 mg via INTRAVENOUS
  Administered 2013-02-04: 10 mg via INTRAVENOUS
  Administered 2013-02-04: 50 mg via INTRAVENOUS
  Administered 2013-02-04: 10 mg via INTRAVENOUS

## 2013-02-04 SURGICAL SUPPLY — 60 items
BLADE SURG ROTATE 9660 (MISCELLANEOUS) IMPLANT
CANISTER SUCTION 2500CC (MISCELLANEOUS) ×4 IMPLANT
CHLORAPREP W/TINT 26ML (MISCELLANEOUS) ×4 IMPLANT
COVER MAYO STAND STRL (DRAPES) ×8 IMPLANT
DRAPE LAPAROSCOPIC ABDOMINAL (DRAPES) ×4 IMPLANT
DRAPE PROXIMA HALF (DRAPES) ×8 IMPLANT
DRAPE UTILITY 15X26 W/TAPE STR (DRAPE) ×20 IMPLANT
DRAPE WARM FLUID 44X44 (DRAPE) ×4 IMPLANT
DRSG OPSITE POSTOP 4X10 (GAUZE/BANDAGES/DRESSINGS) ×8 IMPLANT
DRSG OPSITE POSTOP 4X8 (GAUZE/BANDAGES/DRESSINGS) IMPLANT
ELECT BLADE 6.5 EXT (BLADE) ×4 IMPLANT
ELECT CAUTERY BLADE 6.4 (BLADE) ×8 IMPLANT
ELECT REM PT RETURN 9FT ADLT (ELECTROSURGICAL) ×4
ELECTRODE REM PT RTRN 9FT ADLT (ELECTROSURGICAL) ×2 IMPLANT
GLOVE BIO SURGEON STRL SZ 6.5 (GLOVE) ×6 IMPLANT
GLOVE BIO SURGEON STRL SZ7 (GLOVE) ×8 IMPLANT
GLOVE BIO SURGEON STRL SZ8 (GLOVE) ×8 IMPLANT
GLOVE BIO SURGEONS STRL SZ 6.5 (GLOVE) ×2
GLOVE BIOGEL PI IND STRL 7.0 (GLOVE) ×6 IMPLANT
GLOVE BIOGEL PI IND STRL 7.5 (GLOVE) ×4 IMPLANT
GLOVE BIOGEL PI INDICATOR 7.0 (GLOVE) ×6
GLOVE BIOGEL PI INDICATOR 7.5 (GLOVE) ×4
GLOVE SURG SS PI 7.0 STRL IVOR (GLOVE) ×8 IMPLANT
GOWN STRL NON-REIN LRG LVL3 (GOWN DISPOSABLE) IMPLANT
GOWN STRL REUS W/ TWL LRG LVL4 (GOWN DISPOSABLE) ×10 IMPLANT
GOWN STRL REUS W/TWL LRG LVL4 (GOWN DISPOSABLE) ×10
GOWN STRL REUS W/TWL XL LVL3 (GOWN DISPOSABLE) ×8 IMPLANT
KIT BASIN OR (CUSTOM PROCEDURE TRAY) ×4 IMPLANT
KIT COLOSTOMY ILEOSTOMY 4 (WOUND CARE) ×4 IMPLANT
KIT ROOM TURNOVER OR (KITS) ×4 IMPLANT
LEGGING LITHOTOMY PAIR STRL (DRAPES) IMPLANT
LIGASURE IMPACT 36 18CM CVD LR (INSTRUMENTS) ×4 IMPLANT
NS IRRIG 1000ML POUR BTL (IV SOLUTION) ×16 IMPLANT
PACK GENERAL/GYN (CUSTOM PROCEDURE TRAY) ×4 IMPLANT
PAD ARMBOARD 7.5X6 YLW CONV (MISCELLANEOUS) ×4 IMPLANT
PENCIL BUTTON HOLSTER BLD 10FT (ELECTRODE) ×4 IMPLANT
RELOAD PROXIMATE 75MM BLUE (ENDOMECHANICALS) ×4 IMPLANT
SEPRAFILM PROCEDURAL PACK 3X5 (MISCELLANEOUS) ×4 IMPLANT
SPECIMEN JAR LARGE (MISCELLANEOUS) ×4 IMPLANT
SPONGE LAP 18X18 X RAY DECT (DISPOSABLE) ×8 IMPLANT
STAPLER PROXIMATE 75MM BLUE (STAPLE) ×4 IMPLANT
STAPLER VISISTAT 35W (STAPLE) ×8 IMPLANT
SUCTION POOLE TIP (SUCTIONS) ×4 IMPLANT
SURGILUBE 2OZ TUBE FLIPTOP (MISCELLANEOUS) IMPLANT
SUT PDS AB 1 TP1 96 (SUTURE) ×8 IMPLANT
SUT PROLENE 2 0 CT2 30 (SUTURE) IMPLANT
SUT PROLENE 2 0 KS (SUTURE) ×4 IMPLANT
SUT SILK 2 0 SH CR/8 (SUTURE) ×4 IMPLANT
SUT SILK 2 0 TIES 10X30 (SUTURE) ×4 IMPLANT
SUT SILK 3 0 SH CR/8 (SUTURE) ×4 IMPLANT
SUT SILK 3 0 TIES 10X30 (SUTURE) ×4 IMPLANT
SUT VIC AB 3-0 SH 18 (SUTURE) ×4 IMPLANT
SYR BULB IRRIGATION 50ML (SYRINGE) IMPLANT
TOWEL OR 17X26 10 PK STRL BLUE (TOWEL DISPOSABLE) ×8 IMPLANT
TRAY FOLEY CATH 14FRSI W/METER (CATHETERS) ×4 IMPLANT
TRAY PROCTOSCOPIC FIBER OPTIC (SET/KITS/TRAYS/PACK) IMPLANT
TUBE CONNECTING 12'X1/4 (SUCTIONS) ×1
TUBE CONNECTING 12X1/4 (SUCTIONS) ×3 IMPLANT
WATER STERILE IRR 1000ML POUR (IV SOLUTION) IMPLANT
YANKAUER SUCT BULB TIP NO VENT (SUCTIONS) ×4 IMPLANT

## 2013-02-04 NOTE — Progress Notes (Signed)
1 Day Post-Op  Subjective: Patient comfortable on CPAP. Abdomen non-tender  Objective: Vital signs in last 24 hours: Temp:  [97.8 F (36.6 C)-98.8 F (37.1 C)] 98.8 F (37.1 C) (01/08 0533) Pulse Rate:  [67-92] 77 (01/08 0533) Resp:  [13-22] 18 (01/08 0533) BP: (91-138)/(49-93) 124/79 mmHg (01/08 0533) SpO2:  [95 %-100 %] 97 % (01/08 0533) Weight:  [233 lb 14.5 oz (106.1 kg)] 233 lb 14.5 oz (106.1 kg) (01/08 0533) Last BM Date: 02/03/13  Intake/Output from previous day: 01/07 0701 - 01/08 0700 In: 135 [I.V.:135] Out: 800 [Emesis/NG output:800] Intake/Output this shift:    General appearance: alert, cooperative and no distress GI: distended; non-tender  Lab Results:   Recent Labs  02/02/13 2054 02/03/13 0428  WBC 11.2* 9.7  HGB 12.2 10.5*  HCT 38.0 33.6*  PLT 415* 386   BMET  Recent Labs  02/02/13 0150 02/02/13 1516 02/03/13 0428  NA 140  --  142  K 3.2*  --  3.7  CL 101  --  105  CO2 22  --  24  GLUCOSE 119*  --  115*  BUN 16  --  16  CREATININE 1.10 1.08 1.17*  CALCIUM 9.8  --  9.1   PT/INR  Recent Labs  02/02/13 2054  LABPROT 13.7  INR 1.07   ABG No results found for this basename: PHART, PCO2, PO2, HCO3,  in the last 72 hours  Studies/Results: Ct Abdomen Pelvis W Contrast  02/02/2013   CLINICAL DATA:  63 year old female with abdominal and lower pelvic pain. Vomiting, chills, nausea. Initial encounter.  EXAM: CT ABDOMEN AND PELVIS WITH CONTRAST  TECHNIQUE: Multidetector CT imaging of the abdomen and pelvis was performed using the standard protocol following bolus administration of intravenous contrast.  CONTRAST:  123mL OMNIPAQUE IOHEXOL 300 MG/ML  SOLN  COMPARISON:  [facility] DG ABD ACUTE W/CHEST dated 01/27/2013  FINDINGS: Negative lung bases.  No pericardial or pleural effusion.  No acute osseous abnormality identified. Degenerative changes in the lower lumbar spine including mild spondylolisthesis. Chronic posterior element fusion or  ankylosis at the lowest lumbar levels.  Small volume of pelvic free fluid in the central lower pelvis. Uterus surgically absent. At adnexa surgically absent. Decompressed bladder and rectum.  Redundant but decompressed sigmoid colon. The left colon is decompressed. At the splenic flexure there is abrupt tapering of the colon and indistinctness of the colonic wall and lumen (series 2, images 31 -38 and coronal image 82). There are also small but conspicuous adjacent mesenteric lymph nodes individually measuring up to 8 mm short axis (series 2). The margins of this obstructing lesion are indistinct, but estimated size is 3-4 cm (perhaps as large as 6 cm longitudinally).  The upstream colon then is moderately to severely dilated with gas and fluid (dilated redundant transverse colon up to 65 mm diameter, dilated ascending colon and cecum, the latter up to 81 mm diameter. Mildly prominent distal small bowel. No significantly dilated small bowel. Oral contrast in the stomach and proximal small bowel.  No liver lesion. The gallbladder is surgically absent with associated prominence of the intra and extrahepatic biliary tree. Negative spleen, pancreas and adrenal glands. Negative kidneys. Portal venous system is patent. Major arterial structures in the abdomen and pelvis are patent with no atherosclerosis identified. No abdominal free fluid.  IMPRESSION: 1. 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 Rough estimation of tumor size 3-4  cm. Colonoscopy likely would be the most appropriate next step in evaluation. 2. Obstructed, dilated proximal colon.  Decompressed distal colon. 3. No liver mass or distant metastatic disease identified.   Electronically Signed   By: Lars Pinks M.D.   On: 02/02/2013 09:23    Anti-infectives: Anti-infectives   None      Assessment/Plan: s/p  Procedure(s): COLONOSCOPY (N/A) Plan surgery today - open left hemicolectomy, hopefully with primary anastomosis The surgical procedure has been discussed with the patient.  Potential risks, benefits, alternative treatments, and expected outcomes have been explained.  All of the patient's questions at this time have been answered.  The likelihood of reaching the patient's treatment goal is good.  The patient understand the proposed surgical procedure and wishes to proceed. Invanz Entereg   LOS: 2 days    Jeanell Mangan K. 02/04/2013

## 2013-02-04 NOTE — Op Note (Signed)
Preop diagnosis: Obstructing colon cancer at the splenic flexure Postop diagnosis: Same Procedure performed: Left hemicolectomy with mobilization of the splenic flexure, creation of end colostomy and Hartman's pouch Surgeon:Sinai Illingworth K. Assistant: Dr. Georganna Skeans Anesthesia: Gen. Endotracheal Indications: This is a 63 year old female who presented with progressively worsening left lower quadrant abdominal pain as well as abdominal distention. She has lost weight recently. A CT scan showed a large mass at the splenic flexure of her colon with proximal obstruction. She was unable to undergo a bowel prep. She had a flexible sigmoidoscopy which showed an obstructing mass at her splenic flexure. She presents now for surgical resection.  Description of procedure: The patient was brought to the operating room and placed in a supine position on the operating room table. After an adequate level of general anesthesia was obtained, a Foley catheter was placed under sterile technique. The patient's abdomen was prepped with ChloraPrep and draped sterile fashion. A timeout was taken to ensure the proper patient proper procedure. We made a vertical midline incision from the xiphoid down below the umbilicus. Dissection was carried down the fascia which was divided vertically. We into the peritoneal cavity sharply. The colon appears to be dilated. We opened the incision widely. The small bowel is moderately dilated. The patient has a fairly tight small bowel mesentery so we were unable to adequately eviscerate the small bowel. The transverse colon is quite dilated. I could palpate a firm mass at the splenic flexure. The descending colon was mobilized by dividing the white line of Toldt with cautery. We bluntly mobilized the descending colon medially. We continued our dissection superiorly. I bluntly dissected the hepatic flexure free and mobilized it inferiorly and medially.We divided the transverse colon distal to  the middle colic artery With a DUK-02 stapler. The mesocolon was divided with the LigaSure device. We continued around the splenic flexure down to the mid descending colon. As we were mobilizing the mesentery of the descending colon the tip of the LigaSure device caused a small full-thickness Brandt to the descending colon. We recognize this immediately close the hole with a 2-0 silk to limit any spillage. We continued dissecting several centimeters distal to this colotomy. We then divided the distal descending colon with a GIA-75 stapler.  The specimen was removed and sent for pathologic examination. The abdomen was thoroughly irrigated and inspected for hemostasis. The proximal transverse colon and descending colon are very dilated and thin-walled. He will be quite difficult to mobilize this down into the pelvis to connect to the sigmoid colon. The colon is also not prepped and there was a fair amount of stool within the dilated proximal colon. Therefore we made the decision to bring out a temporary colostomy and Hartman's pouch. We placed Seprafilm within the pelvis and Hartman's pouch which was marked with a 2-0 Prolene suture. We excised a circle of skin in the right upper quadrant brought out the transverse colon. We placed Seprafilm behind the midline fascia and closed the fascia with double-stranded #1 PDS suture. The subcutaneous tissues were irrigated and staples were used to close skin. The distal end of the colostomy seems mildly ischemic so we excised this back to more viable appearing colon. The colostomy was matured with 3-0 Vicryl. An ostomy appliance was applied. The patient was then extubated and brought to recovery room in stable condition. All sponge, instrument, and needle counts are correct.  [name]. Georgette Dover, MD, [facility] Surgery  General/ Trauma Surgery  02/04/2013 1:16 PM

## 2013-02-04 NOTE — Transfer of Care (Signed)
Immediate Anesthesia Transfer of Care Note  Patient: [name]  Procedure(s) Performed: Procedure(s): PARTIAL COLECTOMY (N/A) COLOSTOMY (Right)  Patient Location: PACU  Anesthesia Type:General  Level of Consciousness: sedated and patient cooperative  Airway & Oxygen Therapy: Patient Spontanous Breathing and Patient connected to face mask oxygen  Post-op Assessment: Report given to PACU RN and Post -op Vital signs reviewed and stable  Post vital signs: Reviewed and stable  Complications: No apparent anesthesia complications

## 2013-02-04 NOTE — Anesthesia Procedure Notes (Signed)
Procedure Name: Intubation Date/Time: 02/04/2013 10:24 AM Performed by: Sherilyn Banker Pre-anesthesia Checklist: Patient identified, Emergency Drugs available, Suction available, Patient being monitored and Timeout performed Patient Re-evaluated:Patient Re-evaluated prior to inductionOxygen Delivery Method: Circle system utilized Preoxygenation: Pre-oxygenation with 100% oxygen Intubation Type: IV induction, Cricoid Pressure applied and Rapid sequence Laryngoscope Size: Mac and 3 Grade View: Grade II Tube type: Oral Tube size: 7.5 mm Number of attempts: 1 Airway Equipment and Method: Stylet Placement Confirmation: ETT inserted through vocal cords under direct vision,  positive ETCO2 and breath sounds checked- equal and bilateral Secured at: 21 cm Tube secured with: Tape Dental Injury: Teeth and Oropharynx as per pre-operative assessment

## 2013-02-04 NOTE — Progress Notes (Signed)
Patient is connected to a PCA- her pulse continues to be above 127. PCA pump continues to beep prohibiting medication from patient. RN contacted NP [name] and received orders for a 500cc bolus and an EKG. EKG is completed and bolus is being hung. Contacting NP now. Will continue to monitor

## 2013-02-04 NOTE — Preoperative (Signed)
Beta Blockers   Reason not to administer Beta Blockers:Not Applicable 

## 2013-02-04 NOTE — Progress Notes (Signed)
[facility] PROGRESS NOTE  [name] GHW:[id number] DOB: [birth year] DOA: 02/02/2013 PCP: Walker Kehr, MD  HPI/Subjective: Seen prior to surgery. I will ask general surgery to take over as I am not providing a lot of care for this patient  Assessment/Plan: Active Problems:   OBSTRUCTIVE SLEEP APNEA   HYPERTENSION   Anemia, iron deficiency   Colon obstruction   Hypokalemia   Generalized abdominal pain   Colonic mass   GERD (gastroesophageal reflux disease)   Asthma   Abnormal weight loss   Protein-calorie malnutrition, severe   Colon mass -With partial obstruction, patient still has loose stools. -CT scan suggested malignancy. -Flexible sigmoidoscopy done today by Dr. Ardis Hughs, biopsies pending. -General surgery performed left hemicolectomy today with Hartman's pouch creation.  Anemia -Microcytic anemia likely secondary to the above-mentioned colon mass. -Iron is 21 and ferritin is 13 consistent with iron deficiency anemia likely secondary to chronic blood loss.  Hypertension -Continue home medications.  Chronic back pain -Patient is on chronic narcotics for chronic pain. -Continue, avoid interruption as patient might develop withdrawal symptoms.  OSA -Continue CPAP  Code Status: Full code Family Communication: Plan discussed with the patient. Disposition Plan: Remains inpatient   Consultants:  General surgery  Gastroenterology  Procedures:  Not  Antibiotics:  None   Objective: Filed Vitals:   02/04/13 1513  BP: 104/67  Pulse: 96  Temp: 98 F (36.7 C)  Resp: 24    Intake/Output Summary (Last 24 hours) at 02/04/13 1701 Last data filed at 02/04/13 1445  Gross per 24 hour  Intake   2975 ml  Output   1125 ml  Net   1850 ml   Filed Weights   02/02/13 2037 02/03/13 0446 02/04/13 0533  Weight: 106.6 kg (235 lb 0.2 oz) 106.6 kg (235 lb 0.2 oz) 106.1 kg (233 lb 14.5 oz)    Exam: General: Alert and awake, oriented x3, not in any  acute distress. HEENT: anicteric sclera, pupils reactive to light and accommodation, EOMI CVS: S1-S2 clear, no murmur rubs or gallops Chest: clear to auscultation bilaterally, no wheezing, rales or rhonchi Abdomen: soft nontender, nondistended, normal bowel sounds, no organomegaly Extremities: no cyanosis, clubbing or edema noted bilaterally Neuro: Cranial nerves II-XII intact, no focal neurological deficits  Data Reviewed: Basic Metabolic Panel:  Recent Labs Lab 02/02/13 0150 02/02/13 1516 02/03/13 0428  NA 140  --  142  K 3.2*  --  3.7  CL 101  --  105  CO2 22  --  24  GLUCOSE 119*  --  115*  BUN 16  --  16  CREATININE 1.10 1.08 1.17*  CALCIUM 9.8  --  9.1  MG  --   --  2.2   Liver Function Tests:  Recent Labs Lab 02/02/13 0150 02/03/13 0428  AST 27 17  ALT 25 18  ALKPHOS 143* 111  BILITOT 0.3 0.2*  PROT 7.7 6.3  ALBUMIN 3.4* 2.7*    Recent Labs Lab 02/02/13 0150  LIPASE 22   No results found for this basename: AMMONIA,  in the last 168 hours CBC:  Recent Labs Lab 02/02/13 0150 02/02/13 2054 02/03/13 0428  WBC 10.2 11.2* 9.7  NEUTROABS 8.0*  --  6.7  HGB 12.5 12.2 10.5*  HCT 38.0 38.0 33.6*  MCV 66.3* 67.3* 67.3*  PLT 416* 415* 386   Cardiac Enzymes: No results found for this basename: CKTOTAL, CKMB, CKMBINDEX, TROPONINI,  in the last 168 hours BNP (last 3 results) No results found for  this basename: PROBNP,  in the last 8760 hours CBG: No results found for this basename: GLUCAP,  in the last 168 hours  Micro Recent Results (from the past 240 hour(s))  SURGICAL PCR SCREEN     Status: None   Collection Time    02/04/13  4:39 AM      Result Value Range Status   MRSA, PCR NEGATIVE  NEGATIVE Final   Staphylococcus aureus NEGATIVE  NEGATIVE Final   Comment:            The Xpert SA Assay (FDA     approved for NASAL specimens     in patients over 7 years of age),     is one component of     a comprehensive surveillance     program.  Test  performance has     been validated by [employer] for patients greater     than or equal to 31 year old.     It is not intended     to diagnose infection nor to     guide or monitor treatment.     Studies: No results found.  Scheduled Meds: . [START ON 02/05/2013] alvimopan  12 mg Oral BID  . cefoTEtan (CEFOTAN) IV  1 g Intravenous Q12H  . enalaprilat  0.625 mg Intravenous Q6H  . [START ON 02/05/2013] enoxaparin (LOVENOX) injection  40 mg Subcutaneous Q24H  . HYDROmorphone      . HYDROmorphone      . mometasone-formoterol  2 puff Inhalation BID  . montelukast  10 mg Oral QHS  . morphine   Intravenous Q4H  . morphine      . ondansetron      . pantoprazole (PROTONIX) IV  40 mg Intravenous Q24H  . pneumococcal 23 valent vaccine  0.5 mL Intramuscular Tomorrow-1000   Continuous Infusions: . dextrose 5 % and 0.45 % NaCl with KCl 20 mEq/L 125 mL/hr (02/04/13 1552)  . lactated ringers 10 mL/hr at 02/04/13 0956       Time spent: 35 minutes    [facility]  [facility] Pager [phone] If 7PM-7AM, please contact night-coverage at [email or web address], password [facility] 02/04/2013, 5:01 PM  LOS: 2 days

## 2013-02-04 NOTE — Care Management Note (Unsigned)
    Page 1 of 1   02/04/2013     4:24:13 PM   CARE MANAGEMENT NOTE 02/04/2013  Patient:  [name], [name]   Account Number:  [id number]  Date Initiated:  02/04/2013  Documentation initiated by:  Tomi Bamberger  Subjective/Objective Assessment:   dx colon obstruction -mass  admit- lives with spouse     Action/Plan:   1/8- left hemicolectomy   Anticipated DC Date:  02/06/2013   Anticipated DC Plan:  [place]  CM consult      Choice offered to / List presented to:             Status of service:  In process, will continue to follow Medicare Important Message given?   (If response is "NO", the following Medicare IM given date fields will be blank) Date Medicare IM given:   Date Additional Medicare IM given:    Discharge Disposition:    Per UR Regulation:  Reviewed for med. necessity/level of care/duration of stay  If discussed at [place] of Stay Meetings, dates discussed:    Comments:  02/04/13 16:22 Tomi Bamberger RN, BSN [phone] patient lives with spouse, patient for  left hemicolectomy today.  NCM will continue to follow for dc needs.

## 2013-02-04 NOTE — Anesthesia Postprocedure Evaluation (Signed)
  Anesthesia Post-op Note  Patient: [name]  Procedure(s) Performed: Procedure(s): PARTIAL COLECTOMY (N/A) COLOSTOMY (Right)  Patient Location: PACU  Anesthesia Type:General  Level of Consciousness: awake and alert   Airway and Oxygen Therapy: Patient Spontanous Breathing  Post-op Pain: mild  Post-op Assessment: Post-op Vital signs reviewed, Patient's Cardiovascular Status Stable and Respiratory Function Stable  Post-op Vital Signs: Reviewed  Filed Vitals:   02/04/13 1419  BP:   Pulse:   Temp:   Resp: 20    Complications: No apparent anesthesia complications

## 2013-02-04 NOTE — Anesthesia Preprocedure Evaluation (Addendum)
Anesthesia Evaluation  Patient identified by MRN, date of birth, ID band Patient awake    Reviewed: Allergy & Precautions, H&P , NPO status , Patient's Chart, lab work & pertinent test results  Airway Mallampati: II TM Distance: >3 FB Neck ROM: Full    Dental no notable dental hx. (+) Teeth Intact and Dental Advisory Given   Pulmonary asthma , sleep apnea and Continuous Positive Airway Pressure Ventilation ,  breath sounds clear to auscultation  Pulmonary exam normal       Cardiovascular hypertension, On Medications Rhythm:Regular Rate:Normal     Neuro/Psych negative neurological ROS  negative psych ROS   GI/Hepatic Neg liver ROS, GERD-  Medicated and Controlled,  Endo/Other  Morbid obesity  Renal/GU negative Renal ROS  negative genitourinary   Musculoskeletal   Abdominal   Peds  Hematology negative hematology ROS (+)   Anesthesia Other Findings   Reproductive/Obstetrics negative OB ROS                          Anesthesia Physical Anesthesia Plan  ASA: III  Anesthesia Plan: General   Post-op Pain Management:    Induction: Intravenous, Rapid sequence and Cricoid pressure planned  Airway Management Planned: Oral ETT  Additional Equipment:   Intra-op Plan:   Post-operative Plan: Extubation in OR  Informed Consent: I have reviewed the patients History and Physical, chart, labs and discussed the procedure including the risks, benefits and alternatives for the proposed anesthesia with the patient or authorized representative who has indicated his/her understanding and acceptance.   Dental advisory given  Plan Discussed with: CRNA  Anesthesia Plan Comments:        Anesthesia Quick Evaluation

## 2013-02-05 ENCOUNTER — Inpatient Hospital Stay (HOSPITAL_COMMUNITY): Payer: Medicare Other

## 2013-02-05 DIAGNOSIS — K56609 Unspecified intestinal obstruction, unspecified as to partial versus complete obstruction: Secondary | ICD-10-CM | POA: Diagnosis not present

## 2013-02-05 DIAGNOSIS — N179 Acute kidney failure, unspecified: Secondary | ICD-10-CM | POA: Diagnosis not present

## 2013-02-05 DIAGNOSIS — R918 Other nonspecific abnormal finding of lung field: Secondary | ICD-10-CM | POA: Diagnosis not present

## 2013-02-05 DIAGNOSIS — R109 Unspecified abdominal pain: Secondary | ICD-10-CM | POA: Diagnosis not present

## 2013-02-05 DIAGNOSIS — R05 Cough: Secondary | ICD-10-CM | POA: Diagnosis not present

## 2013-02-05 DIAGNOSIS — E669 Obesity, unspecified: Secondary | ICD-10-CM

## 2013-02-05 DIAGNOSIS — R7309 Other abnormal glucose: Secondary | ICD-10-CM

## 2013-02-05 DIAGNOSIS — R634 Abnormal weight loss: Secondary | ICD-10-CM

## 2013-02-05 DIAGNOSIS — I959 Hypotension, unspecified: Secondary | ICD-10-CM

## 2013-02-05 DIAGNOSIS — R579 Shock, unspecified: Secondary | ICD-10-CM | POA: Diagnosis not present

## 2013-02-05 DIAGNOSIS — R509 Fever, unspecified: Secondary | ICD-10-CM | POA: Diagnosis not present

## 2013-02-05 DIAGNOSIS — Z452 Encounter for adjustment and management of vascular access device: Secondary | ICD-10-CM | POA: Diagnosis not present

## 2013-02-05 LAB — BASIC METABOLIC PANEL
BUN: 17 mg/dL (ref 6–23)
CO2: 20 mEq/L (ref 19–32)
Calcium: 7.7 mg/dL — ABNORMAL LOW (ref 8.4–10.5)
Chloride: 110 mEq/L (ref 96–112)
Creatinine, Ser: 1.9 mg/dL — ABNORMAL HIGH (ref 0.50–1.10)
GFR, EST AFRICAN AMERICAN: 31 mL/min — AB (ref >90)
GFR, EST NON AFRICAN AMERICAN: 27 mL/min — AB (ref >90)
Glucose, Bld: 123 mg/dL — ABNORMAL HIGH (ref 70–99)
POTASSIUM: 4.6 meq/L (ref 3.7–5.3)
Sodium: 142 mEq/L (ref 137–147)

## 2013-02-05 LAB — CBC
HCT: 33.8 % — ABNORMAL LOW (ref 36.0–46.0)
Hemoglobin: 10.8 g/dL — ABNORMAL LOW (ref 12.0–15.0)
MCH: 21.4 pg — ABNORMAL LOW (ref 26.0–34.0)
MCHC: 32 g/dL (ref 30.0–36.0)
MCV: 66.9 fL — ABNORMAL LOW (ref 78.0–100.0)
PLATELETS: 349 10*3/uL (ref 150–400)
RBC: 5.05 MIL/uL (ref 3.87–5.11)
RDW: 18.1 % — AB (ref 11.5–15.5)
WBC: 23.3 10*3/uL — AB (ref 4.0–10.5)

## 2013-02-05 LAB — GLUCOSE, CAPILLARY: GLUCOSE-CAPILLARY: 101 mg/dL — AB (ref 70–99)

## 2013-02-05 LAB — LACTIC ACID, PLASMA
Lactic Acid, Venous: 0.9 mmol/L (ref 0.5–2.2)
Lactic Acid, Venous: 1 mmol/L (ref 0.5–2.2)

## 2013-02-05 LAB — PROCALCITONIN: Procalcitonin: 5.48 ng/mL

## 2013-02-05 MED ORDER — BUDESONIDE 0.5 MG/2ML IN SUSP
0.5000 mg | Freq: Two times a day (BID) | RESPIRATORY_TRACT | Status: DC
  Administered 2013-02-05 – 2013-02-17 (×23): 0.5 mg via RESPIRATORY_TRACT
  Filled 2013-02-05 (×29): qty 2

## 2013-02-05 MED ORDER — SODIUM CHLORIDE 0.9 % IV BOLUS (SEPSIS)
1000.0000 mL | Freq: Once | INTRAVENOUS | Status: AC
Start: 2013-02-05 — End: 2013-02-05
  Administered 2013-02-05: 03:00:00 1000 mL via INTRAVENOUS

## 2013-02-05 MED ORDER — SODIUM CHLORIDE 0.9 % IV SOLN
INTRAVENOUS | Status: DC
  Administered 2013-02-05: 20 mL/h via INTRAVENOUS
  Administered 2013-02-06 – 2013-02-07 (×3): via INTRAVENOUS

## 2013-02-05 MED ORDER — PHENYLEPHRINE HCL 10 MG/ML IJ SOLN
30.0000 ug/min | INTRAVENOUS | Status: DC
  Administered 2013-02-05: 45 ug/min via INTRAVENOUS
  Administered 2013-02-05: 30 ug/min via INTRAVENOUS
  Filled 2013-02-05 (×2): qty 1

## 2013-02-05 MED ORDER — SODIUM CHLORIDE 0.9 % IV SOLN
2000.0000 mg | Freq: Once | INTRAVENOUS | Status: AC
Start: 2013-02-05 — End: 2013-02-05
  Administered 2013-02-05: 2000 mg via INTRAVENOUS
  Filled 2013-02-05: qty 2000

## 2013-02-05 MED ORDER — ARFORMOTEROL TARTRATE 15 MCG/2ML IN NEBU
15.0000 ug | INHALATION_SOLUTION | Freq: Two times a day (BID) | RESPIRATORY_TRACT | Status: DC
  Administered 2013-02-05 – 2013-02-17 (×24): 15 ug via RESPIRATORY_TRACT
  Filled 2013-02-05 (×29): qty 2

## 2013-02-05 MED ORDER — ALBUTEROL SULFATE (2.5 MG/3ML) 0.083% IN NEBU: 2.5000 mg | INHALATION_SOLUTION | RESPIRATORY_TRACT | Status: DC | PRN

## 2013-02-05 MED ORDER — PIPERACILLIN-TAZOBACTAM 3.375 G IVPB
3.3750 g | Freq: Three times a day (TID) | INTRAVENOUS | Status: DC
  Administered 2013-02-05 – 2013-02-08 (×10): 3.375 g via INTRAVENOUS
  Filled 2013-02-05 (×12): qty 50

## 2013-02-05 MED ORDER — ALBUMIN HUMAN 5 % IV SOLN
12.5000 g | Freq: Once | INTRAVENOUS | Status: AC
  Administered 2013-02-05: 12.5 g via INTRAVENOUS
  Filled 2013-02-05: qty 250

## 2013-02-05 MED ORDER — SODIUM CHLORIDE 0.9 % IV SOLN
750.0000 mL | INTRAVENOUS | Status: DC | PRN
  Administered 2013-02-05 – 2013-02-06 (×2): 750 mL via INTRAVENOUS

## 2013-02-05 MED ORDER — SODIUM CHLORIDE 0.9 % IV BOLUS (SEPSIS)
1000.0000 mL | Freq: Once | INTRAVENOUS | Status: AC
  Administered 2013-02-05: 1000 mL via INTRAVENOUS

## 2013-02-05 MED ORDER — VANCOMYCIN HCL IN DEXTROSE 1-5 GM/200ML-% IV SOLN
1000.0000 mg | INTRAVENOUS | Status: DC
  Administered 2013-02-06: 1000 mg via INTRAVENOUS
  Filled 2013-02-05 (×2): qty 200

## 2013-02-05 MED ORDER — WHITE PETROLATUM GEL
Status: AC
  Administered 2013-02-05: 1
  Filled 2013-02-05: qty 5

## 2013-02-05 MED ORDER — SODIUM CHLORIDE 0.9 % IV BOLUS (SEPSIS)
500.0000 mL | Freq: Once | INTRAVENOUS | Status: AC
  Administered 2013-02-05: 01:00:00 500 mL via INTRAVENOUS

## 2013-02-05 MED ORDER — SODIUM CHLORIDE 0.9 % IV BOLUS (SEPSIS)
500.0000 mL | Freq: Once | INTRAVENOUS | Status: AC
  Administered 2013-02-05: 05:00:00 500 mL via INTRAVENOUS

## 2013-02-05 MED ORDER — SODIUM CHLORIDE 0.9 % IV BOLUS (SEPSIS): 500.0000 mL | Freq: Once | INTRAVENOUS | Status: DC

## 2013-02-05 MED ORDER — FENTANYL CITRATE 0.05 MG/ML IJ SOLN
25.0000 ug | INTRAMUSCULAR | Status: DC | PRN
  Administered 2013-02-05: 25 ug via INTRAVENOUS
  Administered 2013-02-06 (×3): 50 ug via INTRAVENOUS
  Filled 2013-02-05 (×4): qty 2

## 2013-02-05 NOTE — Progress Notes (Signed)
Bp in 90s sys, but dipped to 70s. Paged DR; Criselda Peaches bolus per order. Admin zofran & opened abd binder for comfort in lieu of narcotics until BP stable. Emptied gas & 63ml from ostomy.  Will continue to monitor.

## 2013-02-05 NOTE — Progress Notes (Signed)
Pt transferred uneventfully from 5W, vitals per flow sheet, BP low but stable 90s/40s. A-line placed per Respiratory. Will continue to monitor and assess.

## 2013-02-05 NOTE — Progress Notes (Signed)
[name], NP at bedside. Pt will be transferred to stepdown and an arterial line will be placed.

## 2013-02-05 NOTE — Progress Notes (Signed)
[facility] progress note. Chief complaint. Hypotension. History of present illness. This 63 year old female with a colon mass status post left hemicolectomy performed yesterday. The patient has been hypotensive overnight with systolic blood pressure in the 70-80 range. Aggressively bolused the patient with 2500 cc of normal saline but despite the extra fluid her blood pressures have not improved appreciably. Urine output has only been about 350 cc. Etiology of hypotension is unclear. A.m. labs suggest a relatively stable anemia thus I doubt internal blood loss postoperatively. The patient has no specific complaints other than abdominal pain postoperatively. Vital signs. Temperature 90.8, pulse 120, respiration 24, blood pressure 87/44. O2 sats 97%. General appearance. Obese elderly female who is alert and cooperative, in no distress. Cardiac. Rate and rhythm regular with apical pulse about 95 per minute. No jugular venous distention or significant edema. Lungs. Breath sounds are clear and equal without crackles appreciated. Abdomen. Soft with hypotonic bowel sounds. Dressing appears clean with some blood spotting but no evidence of acute bleeding. Impression/plan. Problem #1. Hypotension. Etiology is unclear. I will move the patient to a step down unit for closer monitoring. I've ordered an arterial line to be placed to better monitor blood pressure. Will continue IV fluids as patient does appear somewhat volume deficient.

## 2013-02-05 NOTE — Progress Notes (Addendum)
Pt to [place] to 2S04. BP back to 70s-80s sys, low urine output. 1L bolus, Albumin, vanc zosyn admin per orders. CXR, lactic acid x2 & procal sent. Zofran x2. Good output from ostomy. Bedside report.
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
## 2013-02-05 NOTE — Progress Notes (Signed)
After receiving the 500cc bolus, the patients BP was 78/54 manually. [name], NP notified. Will administer a 1L bolus over an hour. Will continue to monitor.

## 2013-02-05 NOTE — Progress Notes (Signed)
Report called to Matt.

## 2013-02-05 NOTE — Progress Notes (Signed)
Name: [name] MRN: [id number] DOB: 19-Jun-1950    ADMISSION DATE:  02/02/2013  REFERRING MD :  Shane Crutch  CHIEF COMPLAINT:  Low blood pressure  BRIEF PATIENT DESCRIPTION:  63 yo female admitted with abdominal pain from colon mass.    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  LINES / TUBES: Aline 1/8 >>  CULTURES: Blood 1/9 >> Urine 1/9 >>  ANTIBIOTICS: Cefotetan 1/8 >> 1/9 Zosyn 1/9 >> Vancomycin 1/9 >>  SUBJECTIVE:  Refractory hypotension   VITAL SIGNS: Temp:  [97.6 F (36.4 C)-100.4 F (38 C)] 97.6 F (36.4 C) (01/09 1221) Pulse Rate:  [115-124] 117 (01/09 0730) Resp:  [13-36] 26 (01/09 1200) BP: (76-98)/(38-61) 89/52 mmHg (01/09 1200) SpO2:  [88 %-100 %] 96 % (01/09 1203) Arterial Line BP: (76-92)/(42-46) 84/44 mmHg (01/09 1200) Weight:  [110.4 kg (243 lb 6.2 oz)-110.7 kg (244 lb 0.8 oz)] 110.7 kg (244 lb 0.8 oz) (01/09 0640) 4 liters [place]  INTAKE / OUTPUT: Intake/Output     01/08 0701 - 01/09 0700 01/09 0701 - 01/10 0700   I.V. (mL/kg) 6801.2 (61.4)    IV Piggyback 500 800   Total Intake(mL/kg) 7301.2 (66) 800 (7.2)   Urine (mL/kg/hr) 875 (0.3)    Emesis/NG output     Stool  50 (0.1)   Blood 600 (0.2)    Total Output 1475 50   Net +5826.2 +750        Stool Occurrence 1 x      PHYSICAL EXAMINATION: General:  No distress Neuro:  Sleepy, wakes up easily, normal strength, follows commands HEENT:  Pupils reactive, no sinus tenderness, dry oral mucosa Cardiovascular:  Regular, tachycardic, no murmur Lungs:  No wheeze/rales, no accessory use  Abdomen:  Wound dressing clean, colostomy in place Musculoskeletal:  No edema Skin:  No rashes  LABS:  CBC  Recent Labs Lab  02/03/13 0428 02/04/13 1620 02/05/13 0525  WBC 9.7 15.9* 23.3*  HGB 10.5* 12.1 10.8*  HCT 33.6* 37.8 33.8*  PLT 386 384 349   Coag's  Recent Labs Lab 02/02/13 2054  INR 1.07   BMET  Recent Labs Lab 02/02/13 0150  02/03/13 0428 02/04/13 1620 02/05/13 0525  NA 140  --  142  --  142  K 3.2*  --  3.7  --  4.6  CL 101  --  105  --  110  CO2 22  --  24  --  20  BUN 16  --  16  --  17  CREATININE 1.10  < > 1.17* 1.12* 1.90*  GLUCOSE 119*  --  115*  --  123*  < > = values in this interval not displayed. Electrolytes  Recent Labs Lab 02/02/13 0150 02/03/13 0428 02/05/13 0525  CALCIUM 9.8 9.1 7.7*  MG  --  2.2  --    Sepsis Markers  Recent Labs Lab 02/05/13 0849 02/05/13 0850  LATICACIDVEN  --  0.9  PROCALCITON 5.48  --    ABG No results found for this basename: PHART, PCO2ART, PO2ART,  in the last 168 hours Liver Enzymes  Recent Labs Lab 02/02/13 0150 02/03/13 0428  AST 27 17  ALT 25 18  ALKPHOS 143* 111  BILITOT 0.3 0.2*  ALBUMIN 3.4* 2.7*  Cardiac Enzymes No results found for this basename: TROPONINI, PROBNP,  in the last 168 hours Glucose No results found for this basename: GLUCAP,  in the last 168 hours  Imaging Dg Chest Port 1 View  02/05/2013   CLINICAL DATA:  Weakness, cough  EXAM: PORTABLE CHEST - 1 VIEW  COMPARISON:  DG ABD ACUTE W/CHEST dated 01/27/2013  FINDINGS: There are low lung volumes. There is crowding of the central pulmonary markings secondary to low lung volumes. There is no focal consolidation, pleural effusion or pneumothorax. Stable cardiomediastinal silhouette. Unremarkable osseous structures.  IMPRESSION: Low lung volumes with crowding of the central pulmonary markings. No active disease.   Electronically Signed   By: Kathreen Devoid   On: 02/05/2013 09:28    ASSESSMENT / PLAN:  PULMONARY A: Hx of OSA, Asthma. P:   -CPAP qhs -oxygen to keep SpO2 > 92% -brovana, pulmicort, prn albuterol -hold advair, singular for  now  CARDIOVASCULAR A:  Shock ? Sepsis vs hypovolemia  P:  -monitor hemodynamics -continue IV fluids -d/c IV vasotec  RENAL A:   Acute kidney injury in the setting of persistent hypotension  P:   -monitor renal fx, urine outpt, electrolytes while getting IVf luids -allow positive fluid balance  -check urine studies of scr cont to rise -renal dose meds   GASTROINTESTINAL A:   Colon cancer s/p partial colectomy/colostomy. P:   -post -op care, nutrition per CCS  HEMATOLOGIC A:   Anemia of chronic disease. P:  -f/u CBC -transfuse for Hb < 7 -SCD, lovenox for DVT prevention  INFECTIOUS A:   ?sepsis as cause of hypotension >> no obvious source of infection. P:   -abx changed per primary team -see CV section   ENDOCRINE A:   No issues.   P:   -monitor blood sugars on BMET  NEUROLOGIC A:   Post-op pain. P:   -per primary team and CCS  Updated family at bedside.  CC 51min  Mariel Sleet Beeper  [phone]  Cell  [phone]  If no response or cell goes to voicemail, call beeper [phone]

## 2013-02-05 NOTE — Progress Notes (Signed)
1 Day Post-Op  Subjective: Patient transferred to SDU for hypotension, mild tachycardia Cr elevated, moderate UOP  Patient is awake and alert, complaining mostly of midline abdominal pain  Objective: Vital signs in last 24 hours: Temp:  [97.6 F (36.4 C)-100.4 F (38 C)] 98.8 F (37.1 C) (01/09 0743) Pulse Rate:  [67-124] 117 (01/09 0730) Resp:  [13-36] 33 (01/09 0800) BP: (76-149)/(38-70) 80/52 mmHg (01/09 0800) SpO2:  [88 %-100 %] 94 % (01/09 0730) Arterial Line BP: (76-92)/(42-46) 76/45 mmHg (01/09 0800) FiO2 (%):  [99 %] 99 % (01/08 1419) Weight:  [243 lb 6.2 oz (110.4 kg)-244 lb 0.8 oz (110.7 kg)] 244 lb 0.8 oz (110.7 kg) (01/09 0640) Last BM Date: 02/03/13  Intake/Output from previous day: 01/08 0701 - 01/09 0700 In: 7301.2 [I.V.:6801.2; IV Piggyback:500] Out: 1475 [Urine:875; Blood:600] Intake/Output this shift: Total I/O In: -  Out: 50 [Stool:50]  General appearance: alert, cooperative and no distress GI: soft, minimal bowel sounds; ostomy retracted but functioning with soft stool in bag Midline incision c/d/i  Lab Results:   Recent Labs  02/04/13 1620 02/05/13 0525  WBC 15.9* 23.3*  HGB 12.1 10.8*  HCT 37.8 33.8*  PLT 384 349   BMET  Recent Labs  02/03/13 0428 02/04/13 1620 02/05/13 0525  NA 142  --  142  K 3.7  --  4.6  CL 105  --  110  CO2 24  --  20  GLUCOSE 115*  --  123*  BUN 16  --  17  CREATININE 1.17* 1.12* 1.90*  CALCIUM 9.1  --  7.7*   PT/INR  Recent Labs  02/02/13 2054  LABPROT 13.7  INR 1.07   ABG No results found for this basename: PHART, PCO2, PO2, HCO3,  in the last 72 hours  Studies/Results: Dg Chest Port 1 View  02/05/2013   CLINICAL DATA:  Weakness, cough  EXAM: PORTABLE CHEST - 1 VIEW  COMPARISON:  DG ABD ACUTE W/CHEST dated 01/27/2013  FINDINGS: There are low lung volumes. There is crowding of the central pulmonary markings secondary to low lung volumes. There is no focal consolidation, pleural effusion or  pneumothorax. Stable cardiomediastinal silhouette. Unremarkable osseous structures.  IMPRESSION: Low lung volumes with crowding of the central pulmonary markings. No active disease.   Electronically Signed   By: Kathreen Devoid   On: 02/05/2013 09:28    Anti-infectives: Anti-infectives   Start     Dose/Rate Route Frequency Ordered Stop   02/06/13 1000  vancomycin (VANCOCIN) IVPB 1000 mg/200 mL premix     1,000 mg 200 mL/hr over 60 Minutes Intravenous Every 24 hours 02/05/13 0757     02/05/13 0830  vancomycin (VANCOCIN) 2,000 mg in sodium chloride 0.9 % 500 mL IVPB     2,000 mg 250 mL/hr over 120 Minutes Intravenous  Once 02/05/13 0757     02/05/13 0815  piperacillin-tazobactam (ZOSYN) IVPB 3.375 g     3.375 g 12.5 mL/hr over 240 Minutes Intravenous 3 times per day 02/05/13 0757     02/04/13 1515  cefoTEtan (CEFOTAN) 1 g in dextrose 5 % 50 mL IVPB  Status:  Discontinued     1 g 100 mL/hr over 30 Minutes Intravenous Every 12 hours 02/04/13 1511 02/05/13 0851   02/04/13 1045  cefoTEtan (CEFOTAN) 2 g in dextrose 5 % 50 mL IVPB     2 g 100 mL/hr over 30 Minutes Intravenous On call to O.R. 02/04/13 1039 02/04/13 1030      Assessment/Plan: s/p Procedure(s):  PARTIAL COLECTOMY (N/A) COLOSTOMY (Right) WOCN consult today WBC likely acute phase reactant - continue to follow Volume depletion likely cause of hypotension - CCM managing. Pathology pending  LOS: 3 days    Reinette Cuneo K. 02/05/2013

## 2013-02-05 NOTE — Progress Notes (Signed)
500cc bolus given, BP now 80/38 with a pulse of 124. [name], NP notified. Will give patient another 500cc bolus.

## 2013-02-05 NOTE — Progress Notes (Signed)
Report given to Matt, RN

## 2013-02-05 NOTE — Consult Note (Addendum)
WOC ostomy consult note Stoma type/location: Consult requested for new colostomy from 1/8. Discussed stoma appearance with Dr Georgette Dover prior to consult was performed.  Stomal assessment/size: Stoma to RLQ 50% dusky, 50% red. 1 1/4 inches, below skin level Peristomal assessment: Intact skin surrounding Output  Large amt (100cc) liquid brown stool Ostomy pouching: 1pc.  Education provided: Current pouch with seal which is about to leak. Applied one piece pouch with barrier ring to maintain seal.  May need to apply convex pouch with next change R/T stoma which is below skin level.  Supplies ordered to bedside for staff nurse use.  Husband at bedside during pouch change; he and patient watched pouch change demonstration and asked appropriate questions.  Plan to perform further teaching sessions when pt stable and out of ICU.  Educational materials left at bedside. Julien Girt MSN, RN, [place], [place], [place]

## 2013-02-05 NOTE — Clinical Documentation Improvement (Signed)
Possible Clinical Conditions    - Acute Tubular Necrosis   - Other Condition   - Unable to Clinically Determine   Supporting Information:  Documentation of "Acute kidney injury likely 2nd to hypovolemia." CCM progress note 20/35/5974    Postop Systolic BP's 16'L to 84'T  0.73 mg/dl increase in Creat. In 48 hours   Thank You, Erling Conte ,RN BSN CCDS Certified Clinical Documentation Specialist:  [phone] [place] Information Management

## 2013-02-05 NOTE — Procedures (Signed)
Central Venous Catheter Insertion Procedure Note BRIDEY BROOKOVER [id number] [birth year]  Procedure: Insertion of Central Venous Catheter Indications: Assessment of intravascular volume, Drug and/or fluid administration and Frequent blood sampling  Procedure Details Consent: Risks of procedure as well as the alternatives and risks of each were explained to the (patient/caregiver).  Consent for procedure obtained. Time Out: Verified patient identification, verified procedure, site/side was marked, verified correct patient position, special equipment/implants available, medications/allergies/relevent history reviewed, required imaging and test results available.  Performed  Maximum sterile technique was used including antiseptics, cap, gloves, gown, hand hygiene, mask and sheet. Skin prep: Chlorhexidine; local anesthetic administered A antimicrobial bonded/coated triple lumen catheter was placed in the left internal jugular vein to 18 cm using the Seldinger technique.  Evaluation Blood flow good Complications: No apparent complications Patient did tolerate procedure well. Chest X-ray ordered to verify placement.  CXR: pending.   Procedure performed under direct supervision of Dr. Joya Gaskins and with ultrasound guidance for real time vessel cannulation.     Noe Gens, NP-C [place] [facility] Pgr: [phone] or [phone]   02/05/2013, 4:45 PM  I was present for this procedure Saralyn Pilar WrightMD

## 2013-02-05 NOTE — Progress Notes (Signed)
[facility] PROGRESS NOTE  [name] [id number] DOB: 07-23-1950 DOA: 02/02/2013 PCP: Walker Kehr, MD  HPI/Subjective: After she had a surgery patient developed hypotension and transfer to stepdown. PCCM consulted, patient blood pressure better after multiple fluid boluses. Hopefully she will not develop fluid overload. Now placed on Neo-Synephrine.  Assessment/Plan: Active Problems:   OBSTRUCTIVE SLEEP APNEA   HYPERTENSION   Anemia, iron deficiency   Colon obstruction   Hypokalemia   Generalized abdominal pain   Colonic mass   GERD (gastroesophageal reflux disease)   Asthma   Abnormal weight loss   Protein-calorie malnutrition, severe   Shock   Hypotension/Sepsis -Likely sepsis, procalcitonin of 5.4, patient is tachycardic and febrile. -Patient also developed acute renal failure. -No clear evidence of infection, cannot rule out intra-abdominal infection because of recent surgery. -Patient placed on vancomycin and Zosyn. -PCCM consulted.  Colon Adenocarcinoma -With partial obstruction, patient still has loose stools. -CT scan suggested malignancy. -Flexible sigmoidoscopy done today by Dr. Ardis Hughs, biopsies showed adenocarcinoma and -General surgery performed left hemicolectomy today with Hartman's pouch creation.  Acute renal failure -Likely secondary to hypertension, keep systolic blood pressure above 100. -Hydrate with IV fluids.  Anemia -Microcytic anemia likely secondary to the above-mentioned colon mass. -Iron is 21 and ferritin is 13 consistent with iron deficiency anemia likely secondary to chronic blood loss.  Hypertension -Continue home medications. Blood pressure medications discontinued.  Chronic back pain -Patient is on chronic narcotics for chronic pain. -Continue, avoid interruption as patient might develop withdrawal symptoms.  OSA -Continue CPAP  Code Status: Full code Family Communication: Plan discussed with the  patient. Disposition Plan: Remains inpatient   Consultants:  General surgery  Gastroenterology  Procedures:  Not  Antibiotics:  None   Objective: Filed Vitals:   02/05/13 1815  BP:   Pulse: 99  Temp:   Resp: 28    Intake/Output Summary (Last 24 hours) at 02/05/13 1828 Last data filed at 02/05/13 1600  Gross per 24 hour  Intake    850 ml  Output    700 ml  Net    150 ml   Filed Weights   02/04/13 0533 02/05/13 0458 02/05/13 0640  Weight: 106.1 kg (233 lb 14.5 oz) 110.4 kg (243 lb 6.2 oz) 110.7 kg (244 lb 0.8 oz)    Exam: General: Alert and awake, oriented x3, not in any acute distress. HEENT: anicteric sclera, pupils reactive to light and accommodation, EOMI CVS: S1-S2 clear, no murmur rubs or gallops Chest: clear to auscultation bilaterally, no wheezing, rales or rhonchi Abdomen: soft nontender, nondistended, normal bowel sounds, no organomegaly Extremities: no cyanosis, clubbing or edema noted bilaterally Neuro: Cranial nerves II-XII intact, no focal neurological deficits  Data Reviewed: Basic Metabolic Panel:  Recent Labs Lab 02/02/13 0150 02/02/13 1516 02/03/13 0428 02/04/13 1620 02/05/13 0525  NA 140  --  142  --  142  K 3.2*  --  3.7  --  4.6  CL 101  --  105  --  110  CO2 22  --  24  --  20  GLUCOSE 119*  --  115*  --  123*  BUN 16  --  16  --  17  CREATININE 1.10 1.08 1.17* 1.12* 1.90*  CALCIUM 9.8  --  9.1  --  7.7*  MG  --   --  2.2  --   --    Liver Function Tests:  Recent Labs Lab 02/02/13 0150 02/03/13 0428  AST 27 17  ALT 25 18  ALKPHOS 143* 111  BILITOT 0.3 0.2*  PROT 7.7 6.3  ALBUMIN 3.4* 2.7*    Recent Labs Lab 02/02/13 0150  LIPASE 22   No results found for this basename: AMMONIA,  in the last 168 hours CBC:  Recent Labs Lab 02/02/13 0150 02/02/13 2054 02/03/13 0428 02/04/13 1620 02/05/13 0525  WBC 10.2 11.2* 9.7 15.9* 23.3*  NEUTROABS 8.0*  --  6.7  --   --   HGB 12.5 12.2 10.5* 12.1 10.8*  HCT 38.0  38.0 33.6* 37.8 33.8*  MCV 66.3* 67.3* 67.3* 67.6* 66.9*  PLT 416* 415* 386 384 349   Cardiac Enzymes: No results found for this basename: CKTOTAL, CKMB, CKMBINDEX, TROPONINI,  in the last 168 hours BNP (last 3 results) No results found for this basename: PROBNP,  in the last 8760 hours CBG: No results found for this basename: GLUCAP,  in the last 168 hours  Micro Recent Results (from the past 240 hour(s))  SURGICAL PCR SCREEN     Status: None   Collection Time    02/04/13  4:39 AM      Result Value Range Status   MRSA, PCR NEGATIVE  NEGATIVE Final   Staphylococcus aureus NEGATIVE  NEGATIVE Final   Comment:            The Xpert SA Assay (FDA     approved for NASAL specimens     in patients over 74 years of age),     is one component of     a comprehensive surveillance     program.  Test performance has     been validated by [employer] for patients greater     than or equal to 24 year old.     It is not intended     to diagnose infection nor to     guide or monitor treatment.     Studies: Dg Chest Port 1 View  02/05/2013   CLINICAL DATA:  Line placement.  EXAM: PORTABLE CHEST - 1 VIEW  COMPARISON:  Same day.  FINDINGS: Stable cardiomediastinal silhouette. Interval placement of left internal jugular catheter line with distal tip in expected position of the cavoatrial junction. No pneumothorax is noted. No pleural effusion is noted. No acute pulmonary disease is noted.  IMPRESSION: No pneumothorax seen status post left internal jugular central line placement.   Electronically Signed   By: Sabino Dick M.D.   On: 02/05/2013 17:35   Dg Chest Port 1 View  02/05/2013   CLINICAL DATA:  Weakness, cough  EXAM: PORTABLE CHEST - 1 VIEW  COMPARISON:  DG ABD ACUTE W/CHEST dated 01/27/2013  FINDINGS: There are low lung volumes. There is crowding of the central pulmonary markings secondary to low lung volumes. There is no focal consolidation, pleural effusion or pneumothorax. Stable  cardiomediastinal silhouette. Unremarkable osseous structures.  IMPRESSION: Low lung volumes with crowding of the central pulmonary markings. No active disease.   Electronically Signed   By: Kathreen Devoid   On: 02/05/2013 09:28    Scheduled Meds: . alvimopan  12 mg Oral BID  . arformoterol  15 mcg Nebulization BID  . budesonide (PULMICORT) nebulizer solution  0.5 mg Nebulization BID  . enoxaparin (LOVENOX) injection  40 mg Subcutaneous Q24H  . pantoprazole (PROTONIX) IV  40 mg Intravenous Q24H  . piperacillin-tazobactam (ZOSYN)  IV  3.375 g Intravenous Q8H  . pneumococcal 23 valent vaccine  0.5 mL Intramuscular Tomorrow-1000  .  sodium chloride  500 mL Intravenous Once  . [START ON 02/06/2013] vancomycin  1,000 mg Intravenous Q24H  . white petrolatum       Continuous Infusions: . sodium chloride 20 mL/hr (02/05/13 1800)  . phenylephrine (NEO-SYNEPHRINE) Adult infusion 30 mcg/min (02/05/13 1801)       Time spent: 35 minutes    [facility] A  [facility] Pager [phone] If 7PM-7AM, please contact night-coverage at [email or web address], password [email or web address] 02/05/2013, 6:28 PM  LOS: 3 days

## 2013-02-05 NOTE — Progress Notes (Signed)
Patient received another 500cc bolus, after her BP is 87/44, respirations 24, pulse 120. Pt states she has not passed much gas since her surgery but she has been belching. Pt has had 350 cc output during my shift. NP called, awaiting call back.
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
## 2013-02-06 DIAGNOSIS — I959 Hypotension, unspecified: Secondary | ICD-10-CM | POA: Diagnosis not present

## 2013-02-06 DIAGNOSIS — J45909 Unspecified asthma, uncomplicated: Secondary | ICD-10-CM | POA: Diagnosis not present

## 2013-02-06 DIAGNOSIS — K6389 Other specified diseases of intestine: Secondary | ICD-10-CM | POA: Diagnosis not present

## 2013-02-06 DIAGNOSIS — R579 Shock, unspecified: Secondary | ICD-10-CM

## 2013-02-06 DIAGNOSIS — E43 Unspecified severe protein-calorie malnutrition: Secondary | ICD-10-CM

## 2013-02-06 DIAGNOSIS — N179 Acute kidney failure, unspecified: Secondary | ICD-10-CM | POA: Diagnosis not present

## 2013-02-06 LAB — BASIC METABOLIC PANEL
BUN: 17 mg/dL (ref 6–23)
CALCIUM: 7.8 mg/dL — AB (ref 8.4–10.5)
CHLORIDE: 111 meq/L (ref 96–112)
CO2: 19 meq/L (ref 19–32)
Creatinine, Ser: 1.89 mg/dL — ABNORMAL HIGH (ref 0.50–1.10)
GFR calc non Af Amer: 27 mL/min — ABNORMAL LOW (ref >90)
GFR, EST AFRICAN AMERICAN: 32 mL/min — AB (ref >90)
Glucose, Bld: 98 mg/dL (ref 70–99)
Potassium: 3.8 mEq/L (ref 3.7–5.3)
Sodium: 142 mEq/L (ref 137–147)

## 2013-02-06 LAB — CBC
HCT: 26.9 % — ABNORMAL LOW (ref 36.0–46.0)
Hemoglobin: 8.7 g/dL — ABNORMAL LOW (ref 12.0–15.0)
MCH: 21.3 pg — ABNORMAL LOW (ref 26.0–34.0)
MCHC: 32.3 g/dL (ref 30.0–36.0)
MCV: 65.8 fL — ABNORMAL LOW (ref 78.0–100.0)
PLATELETS: 297 10*3/uL (ref 150–400)
RBC: 4.09 MIL/uL (ref 3.87–5.11)
RDW: 18 % — ABNORMAL HIGH (ref 11.5–15.5)
WBC: 21.6 10*3/uL — AB (ref 4.0–10.5)

## 2013-02-06 LAB — HEMOGLOBIN AND HEMATOCRIT, BLOOD
HEMATOCRIT: 28.6 % — AB (ref 36.0–46.0)
HEMOGLOBIN: 9.3 g/dL — AB (ref 12.0–15.0)

## 2013-02-06 LAB — GLUCOSE, CAPILLARY
GLUCOSE-CAPILLARY: 113 mg/dL — AB (ref 70–99)
Glucose-Capillary: 105 mg/dL — ABNORMAL HIGH (ref 70–99)
Glucose-Capillary: 81 mg/dL (ref 70–99)
Glucose-Capillary: 123 mg/dL — ABNORMAL HIGH (ref 70–99)
Glucose-Capillary: 149 mg/dL — ABNORMAL HIGH (ref 70–99)
Glucose-Capillary: 99 mg/dL (ref 70–99)

## 2013-02-06 LAB — CORTISOL: CORTISOL PLASMA: 16.6 ug/dL

## 2013-02-06 LAB — URINE CULTURE
COLONY COUNT: NO GROWTH
Culture: NO GROWTH

## 2013-02-06 MED ORDER — ALUM & MAG HYDROXIDE-SIMETH 200-200-20 MG/5ML PO SUSP
30.0000 mL | Freq: Four times a day (QID) | ORAL | Status: DC | PRN
  Administered 2013-02-06 – 2013-02-07 (×2): 30 mL via ORAL
  Filled 2013-02-06 (×2): qty 30

## 2013-02-06 MED ORDER — DEXTROSE 5 % IV SOLN
30.0000 ug/min | INTRAVENOUS | Status: DC
  Administered 2013-02-06: 80 ug/min via INTRAVENOUS
  Filled 2013-02-06 (×2): qty 4

## 2013-02-06 MED ORDER — HYDROCORTISONE SOD SUCCINATE 100 MG IJ SOLR
50.0000 mg | Freq: Four times a day (QID) | INTRAMUSCULAR | Status: DC
  Administered 2013-02-06 – 2013-02-08 (×7): 50 mg via INTRAVENOUS
  Filled 2013-02-06 (×11): qty 1

## 2013-02-06 MED ORDER — FENTANYL CITRATE 0.05 MG/ML IJ SOLN
25.0000 ug | INTRAMUSCULAR | Status: DC | PRN
  Administered 2013-02-06 (×2): 75 ug via INTRAVENOUS
  Administered 2013-02-07: 50 ug via INTRAVENOUS
  Administered 2013-02-07 (×2): 75 ug via INTRAVENOUS
  Administered 2013-02-07: 50 ug via INTRAVENOUS
  Administered 2013-02-07: 75 ug via INTRAVENOUS
  Administered 2013-02-08: 50 ug via INTRAVENOUS
  Administered 2013-02-08: 75 ug via INTRAVENOUS
  Administered 2013-02-09 – 2013-02-10 (×6): 50 ug via INTRAVENOUS
  Administered 2013-02-11 (×2): 75 ug via INTRAVENOUS
  Administered 2013-02-12: 50 ug via INTRAVENOUS
  Administered 2013-02-12 – 2013-02-13 (×2): 25 ug via INTRAVENOUS
  Administered 2013-02-13: 50 ug via INTRAVENOUS
  Administered 2013-02-14: 25 ug via INTRAVENOUS
  Filled 2013-02-06 (×22): qty 2

## 2013-02-06 NOTE — Progress Notes (Signed)
Name: [name] MRN: [id number] DOB: [birth year]    ADMISSION DATE:  02/02/2013  REFERRING MD :  Shane Crutch  CHIEF COMPLAINT:  Low blood pressure  BRIEF PATIENT DESCRIPTION:  63 yo female admitted with abdominal pain from colon mass.    SIGNIFICANT EVENTS: 1/6 Admit, GI/CCS consulted 1/8 Left hemicolectomy with mobilization of the splenic flexure, creation of end colostomy and Hartman's pouch 1/9 Hypotension, PCCM consulted, transfer to ICU  STUDIES:  1/6 CT abd/pelvis >> mass splenic flexure 1/7 Sigmoidoscopy >> mass at splenic flexure >> adenocarcinoma  LINES / TUBES: Aline 1/8 >> Lt IJ CVL 1/9 >>  CULTURES: Blood 1/9 >> Urine 1/9 >>  ANTIBIOTICS: Cefotetan 1/8 >> 1/9 Zosyn 1/9 >> Vancomycin 1/9 >>  SUBJECTIVE:  Remains on pressors.  Denies chest pain.  Breathing okay.  Abdominal pain tolerable.  VITAL SIGNS: Temp:  [97.6 F (36.4 C)-100.9 F (38.3 C)] 100.9 F (38.3 C) (01/10 0734) Pulse Rate:  [74-158] 83 (01/10 0800) Resp:  [21-39] 27 (01/10 0800) BP: (77-107)/(35-64) 102/54 mmHg (01/10 0800) SpO2:  [88 %-100 %] 89 % (01/10 0800) Arterial Line BP: (57-116)/(32-58) 108/51 mmHg (01/10 0800) Weight:  [260 lb 2.3 oz (118 kg)] 260 lb 2.3 oz (118 kg) (01/10 0500) CPAP  INTAKE / OUTPUT: Intake/Output     01/09 0701 - 01/10 0700 01/10 0701 - 01/11 0700   I.V. (mL/kg) 3260.2 (27.6) 133.8 (1.1)   IV Piggyback 937.5 12.5   Total Intake(mL/kg) 4197.7 (35.6) 146.3 (1.2)   Urine (mL/kg/hr) 550 (0.2) 100 (0.5)   Stool 1725 (0.6) 400 (2.2)   Blood     Total Output 2275 500   Net +1922.7 -353.7         HEMODYNAMICS CVP:  [4 mmHg-15 mmHg] 13 mmHg PHYSICAL EXAMINATION: General:  No distress Neuro:  Sleepy, wakes up easily, normal strength, follows commands HEENT:  Pupils reactive, no sinus tenderness, dry oral mucosa Cardiovascular:  Regular, tachycardic, no murmur Lungs:  No wheeze/rales Abdomen:  Wound dressing clean, colostomy in  place Musculoskeletal:  No edema Skin:  No rashes  LABS:  CBC  Recent Labs Lab 02/04/13 1620 02/05/13 0525 02/06/13 0515  WBC 15.9* 23.3* 21.6*  HGB 12.1 10.8* 8.7*  HCT 37.8 33.8* 26.9*  PLT 384 349 297   Coag's  Recent Labs Lab 02/02/13 2054  INR 1.07   BMET  Recent Labs Lab 02/03/13 0428 02/04/13 1620 02/05/13 0525 02/06/13 0515  NA 142  --  142 142  K 3.7  --  4.6 3.8  CL 105  --  110 111  CO2 24  --  20 19  BUN 16  --  17 17  CREATININE 1.17* 1.12* 1.90* 1.89*  GLUCOSE 115*  --  123* 98   Electrolytes  Recent Labs Lab 02/03/13 0428 02/05/13 0525 02/06/13 0515  CALCIUM 9.1 7.7* 7.8*  MG 2.2  --   --    Sepsis Markers  Recent Labs Lab 02/05/13 0849 02/05/13 0850 02/05/13 1533  LATICACIDVEN  --  0.9 1.0  PROCALCITON 5.48  --   --    Liver Enzymes  Recent Labs Lab 02/02/13 0150 02/03/13 0428  AST 27 17  ALT 25 18  ALKPHOS 143* 111  BILITOT 0.3 0.2*  ALBUMIN 3.4* 2.7*   Glucose  Recent Labs Lab 02/05/13 1938 02/06/13 0006 02/06/13 0343 02/06/13 0731  GLUCAP 101* 113* 105* 81    Imaging Dg Chest Port 1 View  02/05/2013   CLINICAL DATA:  Line  placement.  EXAM: PORTABLE CHEST - 1 VIEW  COMPARISON:  Same day.  FINDINGS: Stable cardiomediastinal silhouette. Interval placement of left internal jugular catheter line with distal tip in expected position of the cavoatrial junction. No pneumothorax is noted. No pleural effusion is noted. No acute pulmonary disease is noted.  IMPRESSION: No pneumothorax seen status post left internal jugular central line placement.   Electronically Signed   By: Sabino Dick M.D.   On: 02/05/2013 17:35   Dg Chest Port 1 View  02/05/2013   CLINICAL DATA:  Weakness, cough  EXAM: PORTABLE CHEST - 1 VIEW  COMPARISON:  DG ABD ACUTE W/CHEST dated 01/27/2013  FINDINGS: There are low lung volumes. There is crowding of the central pulmonary markings secondary to low lung volumes. There is no focal consolidation,  pleural effusion or pneumothorax. Stable cardiomediastinal silhouette. Unremarkable osseous structures.  IMPRESSION: Low lung volumes with crowding of the central pulmonary markings. No active disease.   Electronically Signed   By: Kathreen Devoid   On: 02/05/2013 09:28    ASSESSMENT / PLAN:  PULMONARY A: Hx of OSA, Asthma. P:   -CPAP qhs -oxygen to keep SpO2 > 92% -brovana, pulmicort, prn albuterol -hold advair, singular for now  CARDIOVASCULAR A:  Hypotension >> likely from hypovolemia, less likely sepsis. P:  -goal CVP 10 to 12 -pressors to keep SBP > 90, MAP > 65 -add solu cortef and check cortisol -check Echo  RENAL A:   Acute kidney injury likely 2nd to hypovolemia. P:   -monitor renal fx, urine outpt, electrolytes while getting IV fluids  GASTROINTESTINAL A:   Colon cancer s/p Lt hemicolectomy, end colostomy and Hartman's pouch. Nutrition. P:   -post -op care, nutrition per CCS  HEMATOLOGIC A:   Anemia of chronic disease >> likely also hemodiluational. P:  -f/u CBC -transfuse for Hb < 7 -SCD, lovenox for DVT prevention  INFECTIOUS A:   ?sepsis as cause of hypotension >> no obvious source of infection. P:   -continue vancomycin, zosyn for now  ENDOCRINE A:   No issues.   P:   -monitor blood sugars on BMET  NEUROLOGIC A:   Post-op pain. P:   -per primary team and CCS  D/w Dr. Georgette Dover.  CC time 35 minutes.  Chesley Mires, MD [facility] Pulmonary/Critical Care 02/06/2013, 8:35 AM Pager:  [phone] After 3pm call: [phone]

## 2013-02-06 NOTE — Progress Notes (Signed)
2 Days Post-Op  Subjective: Patient less hypotensive - responding to volume No significant abdominal pain Large amount of stool in colostomy bag  Objective: Vital signs in last 24 hours: Temp:  [97.6 F (36.4 C)-100.9 F (38.3 C)] 100.9 F (38.3 C) (01/10 0734) Pulse Rate:  [74-158] 83 (01/10 0800) Resp:  [21-39] 27 (01/10 0800) BP: (77-107)/(35-64) 102/54 mmHg (01/10 0800) SpO2:  [88 %-100 %] 89 % (01/10 0800) Arterial Line BP: (57-116)/(32-58) 108/51 mmHg (01/10 0800) Weight:  [260 lb 2.3 oz (118 kg)] 260 lb 2.3 oz (118 kg) (01/10 0500) Last BM Date: 02/05/13  Intake/Output from previous day: 01/09 0701 - 01/10 0700 In: 4163.9 [I.V.:3226.4; IV Piggyback:937.5] Out: 2150 [Urine:425; Stool:1725] Intake/Output this shift: Total I/O In: 4096 [I.V.:4096] Out: -   General appearance: alert, cooperative and no distress Resp: clear to auscultation bilaterally Cardio: regular rate and rhythm, S1, S2 normal, no murmur, click, rub or gallop GI: soft, ostomy retracted some but functioning quite well; visible mucosa is purple but viable Midline wound - staple line c/d/i through honeycomb dressing.  Lab Results:   Recent Labs  02/05/13 0525 02/06/13 0515  WBC 23.3* 21.6*  HGB 10.8* 8.7*  HCT 33.8* 26.9*  PLT 349 297   BMET  Recent Labs  02/05/13 0525 02/06/13 0515  NA 142 142  K 4.6 3.8  CL 110 111  CO2 20 19  GLUCOSE 123* 98  BUN 17 17  CREATININE 1.90* 1.89*  CALCIUM 7.7* 7.8*   PT/INR No results found for this basename: LABPROT, INR,  in the last 72 hours ABG No results found for this basename: PHART, PCO2, PO2, HCO3,  in the last 72 hours  Studies/Results: Dg Chest Port 1 View  02/05/2013   CLINICAL DATA:  Line placement.  EXAM: PORTABLE CHEST - 1 VIEW  COMPARISON:  Same day.  FINDINGS: Stable cardiomediastinal silhouette. Interval placement of left internal jugular catheter line with distal tip in expected position of the cavoatrial junction. No  pneumothorax is noted. No pleural effusion is noted. No acute pulmonary disease is noted.  IMPRESSION: No pneumothorax seen status post left internal jugular central line placement.   Electronically Signed   By: Sabino Dick M.D.   On: 02/05/2013 17:35   Dg Chest Port 1 View  02/05/2013   CLINICAL DATA:  Weakness, cough  EXAM: PORTABLE CHEST - 1 VIEW  COMPARISON:  DG ABD ACUTE W/CHEST dated 01/27/2013  FINDINGS: There are low lung volumes. There is crowding of the central pulmonary markings secondary to low lung volumes. There is no focal consolidation, pleural effusion or pneumothorax. Stable cardiomediastinal silhouette. Unremarkable osseous structures.  IMPRESSION: Low lung volumes with crowding of the central pulmonary markings. No active disease.   Electronically Signed   By: Kathreen Devoid   On: 02/05/2013 09:28    Anti-infectives: Anti-infectives   Start     Dose/Rate Route Frequency Ordered Stop   02/06/13 1000  vancomycin (VANCOCIN) IVPB 1000 mg/200 mL premix     1,000 mg 200 mL/hr over 60 Minutes Intravenous Every 24 hours 02/05/13 0757     02/05/13 0830  vancomycin (VANCOCIN) 2,000 mg in sodium chloride 0.9 % 500 mL IVPB     2,000 mg 250 mL/hr over 120 Minutes Intravenous  Once 02/05/13 0757 02/05/13 1100   02/05/13 0815  piperacillin-tazobactam (ZOSYN) IVPB 3.375 g     3.375 g 12.5 mL/hr over 240 Minutes Intravenous 3 times per day 02/05/13 0757     02/04/13 1515  cefoTEtan (  CEFOTAN) 1 g in dextrose 5 % 50 mL IVPB  Status:  Discontinued     1 g 100 mL/hr over 30 Minutes Intravenous Every 12 hours 02/04/13 1511 02/05/13 0851   02/04/13 1045  cefoTEtan (CEFOTAN) 2 g in dextrose 5 % 50 mL IVPB     2 g 100 mL/hr over 30 Minutes Intravenous On call to O.R. 02/04/13 1039 02/04/13 1030      Assessment/Plan: s/p Procedure(s): PARTIAL COLECTOMY (N/A) COLOSTOMY (Right) Advance diet OOB to chair Will watch colostomy - if it retracts further, she may need revision. Transfusion decision  per CCM.   LOS: 4 days    Melissa Klee K. 02/06/2013

## 2013-02-06 NOTE — Progress Notes (Signed)
RT visited pt to place CPAP qHS.  RT asked to come back later for placement.  RT will monitor and place CPAP at later time.  Pt currently doing well with 2LNC.

## 2013-02-07 ENCOUNTER — Inpatient Hospital Stay (HOSPITAL_COMMUNITY): Payer: Medicare Other

## 2013-02-07 DIAGNOSIS — R109 Unspecified abdominal pain: Secondary | ICD-10-CM | POA: Diagnosis not present

## 2013-02-07 DIAGNOSIS — K219 Gastro-esophageal reflux disease without esophagitis: Secondary | ICD-10-CM | POA: Diagnosis not present

## 2013-02-07 DIAGNOSIS — I369 Nonrheumatic tricuspid valve disorder, unspecified: Secondary | ICD-10-CM

## 2013-02-07 DIAGNOSIS — J45909 Unspecified asthma, uncomplicated: Secondary | ICD-10-CM | POA: Diagnosis not present

## 2013-02-07 DIAGNOSIS — R112 Nausea with vomiting, unspecified: Secondary | ICD-10-CM | POA: Diagnosis not present

## 2013-02-07 DIAGNOSIS — N179 Acute kidney failure, unspecified: Secondary | ICD-10-CM | POA: Diagnosis not present

## 2013-02-07 DIAGNOSIS — J9819 Other pulmonary collapse: Secondary | ICD-10-CM | POA: Diagnosis not present

## 2013-02-07 DIAGNOSIS — K6389 Other specified diseases of intestine: Secondary | ICD-10-CM | POA: Diagnosis not present

## 2013-02-07 LAB — CBC
HEMATOCRIT: 25.1 % — AB (ref 36.0–46.0)
Hemoglobin: 8.5 g/dL — ABNORMAL LOW (ref 12.0–15.0)
MCH: 22 pg — ABNORMAL LOW (ref 26.0–34.0)
MCHC: 33.9 g/dL (ref 30.0–36.0)
MCV: 65 fL — ABNORMAL LOW (ref 78.0–100.0)
Platelets: 261 10*3/uL (ref 150–400)
RBC: 3.86 MIL/uL — ABNORMAL LOW (ref 3.87–5.11)
RDW: 18.3 % — AB (ref 11.5–15.5)
WBC: 14.9 10*3/uL — ABNORMAL HIGH (ref 4.0–10.5)

## 2013-02-07 LAB — GLUCOSE, CAPILLARY
GLUCOSE-CAPILLARY: 118 mg/dL — AB (ref 70–99)
GLUCOSE-CAPILLARY: 110 mg/dL — AB (ref 70–99)
Glucose-Capillary: 90 mg/dL (ref 70–99)
Glucose-Capillary: 111 mg/dL — ABNORMAL HIGH (ref 70–99)
Glucose-Capillary: 102 mg/dL — ABNORMAL HIGH (ref 70–99)
Glucose-Capillary: 112 mg/dL — ABNORMAL HIGH (ref 70–99)

## 2013-02-07 LAB — BASIC METABOLIC PANEL
BUN: 17 mg/dL (ref 6–23)
CHLORIDE: 113 meq/L — AB (ref 96–112)
CO2: 21 mEq/L (ref 19–32)
CREATININE: 1.63 mg/dL — AB (ref 0.50–1.10)
Calcium: 8.6 mg/dL (ref 8.4–10.5)
GFR calc Af Amer: 38 mL/min — ABNORMAL LOW (ref >90)
GFR calc non Af Amer: 33 mL/min — ABNORMAL LOW (ref >90)
Glucose, Bld: 110 mg/dL — ABNORMAL HIGH (ref 70–99)
Potassium: 3.3 mEq/L — ABNORMAL LOW (ref 3.7–5.3)
Sodium: 146 mEq/L (ref 137–147)

## 2013-02-07 LAB — PROCALCITONIN: Procalcitonin: 3.8 ng/mL

## 2013-02-07 MED ORDER — PROMETHAZINE HCL 25 MG/ML IJ SOLN: 12.5000 mg | Freq: Four times a day (QID) | INTRAMUSCULAR | Status: DC | PRN

## 2013-02-07 MED ORDER — BIOTENE DRY MOUTH MT LIQD
15.0000 mL | Freq: Two times a day (BID) | OROMUCOSAL | Status: DC
  Administered 2013-02-07 – 2013-02-11 (×9): 15 mL via OROMUCOSAL

## 2013-02-07 MED ORDER — POTASSIUM CHLORIDE 10 MEQ/50ML IV SOLN
10.0000 meq | INTRAVENOUS | Status: AC
  Administered 2013-02-07 (×6): 10 meq via INTRAVENOUS
  Filled 2013-02-07 (×4): qty 50

## 2013-02-07 MED ORDER — CHLORHEXIDINE GLUCONATE 0.12 % MT SOLN
15.0000 mL | Freq: Two times a day (BID) | OROMUCOSAL | Status: DC
  Administered 2013-02-07 – 2013-02-08 (×4): 15 mL via OROMUCOSAL
  Filled 2013-02-07 (×4): qty 15

## 2013-02-07 MED ORDER — PROMETHAZINE HCL 25 MG/ML IJ SOLN
12.5000 mg | Freq: Four times a day (QID) | INTRAMUSCULAR | Status: DC | PRN
  Administered 2013-02-07 (×2): 12.5 mg via INTRAVENOUS
  Filled 2013-02-07 (×2): qty 1

## 2013-02-07 MED ORDER — VANCOMYCIN HCL 10 G IV SOLR
1500.0000 mg | INTRAVENOUS | Status: DC
  Filled 2013-02-07: qty 1500

## 2013-02-07 NOTE — Plan of Care (Signed)
Problem: Problem: Diet/Nutrition Progression Goal: ADEQUATE NUTRITION/ABSENCE OF UNCONTROLLED N/V Outcome: Not Progressing NG tube placed
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
## 2013-02-07 NOTE — Progress Notes (Signed)
[place] Progress Note Patient Name: [name] DOB: July 27, 1950 MRN: [id number]  Date of Service  02/07/2013   HPI/Events of Note  Hypokalemia  eICU Interventions  Potassium replaced   Intervention Category Intermediate Interventions: Electrolyte abnormality - evaluation and management  Samer Dutton 02/07/2013, 5:56 AM

## 2013-02-07 NOTE — Progress Notes (Signed)
Still vomiting.  Stoma is putting out lots.  Will follow.  X-rays show possible closed loop.  Kathryne Eriksson. Dahlia Bailiff, MD, [place] [phone] [facility] Surgery

## 2013-02-07 NOTE — Plan of Care (Signed)
Problem: Phase I Progression Outcomes Goal: Voiding-avoid urinary catheter unless indicated Outcome: Not Met (add Reason) Pt has Foley Goal: Vital signs/hemodynamically stable Outcome: Progressing Off pressors tonight, BP stable

## 2013-02-07 NOTE — Progress Notes (Signed)
Patient ID: [name], female   DOB: 01/23/1951, 63 y.o.   MRN: [id number]  Subjective: Vomit x2, nausea this AM since starting clears.    Objective:  Vital signs:  Filed Vitals:   02/07/13 0729 02/07/13 0800 02/07/13 0835 02/07/13 0900  BP:  112/63    Pulse:  98  105  Temp: 98.2 F (36.8 C)     TempSrc: Oral     Resp:  29  31  Height:      Weight:      SpO2:  95% 97% 91%    Last BM Date: 02/06/13  Intake/Output   Yesterday:  01/10 0701 - 01/11 0700 In: 3389.4 [P.O.:295; I.V.:2656.9; IV Piggyback:437.5] Out: 4155 [Urine:2180; Emesis/NG output:500; Stool:1475] This shift:  Total I/O In: 75 [IV Piggyback:75] Out: 275 [Urine:125; Stool:150]   Physical Exam: General: Pt awake/alert/oriented x3 in no acute distress Abdomen: Soft.  Nondistended. Tender to epigastric region.  Right sided colostomy, stoma is retracted, there is semi solid stool in bag.  No evidence of peritonitis.  No incarcerated hernias.   Problem List:   Active Problems:   OBSTRUCTIVE SLEEP APNEA   HYPERTENSION   Anemia, iron deficiency   Colon obstruction   Hypokalemia   Generalized abdominal pain   Colonic mass   GERD (gastroesophageal reflux disease)   Asthma   Abnormal weight loss   Protein-calorie malnutrition, severe   Shock    Results:   Labs: Results for orders placed during the hospital encounter of 02/02/13 (from the past 48 hour(s))  LACTIC ACID, PLASMA     Status: None   Collection Time    02/05/13  3:33 PM      Result Value Range   Lactic Acid, Venous 1.0  0.5 - 2.2 mmol/L  GLUCOSE, CAPILLARY     Status: Abnormal   Collection Time    02/05/13  7:38 PM      Result Value Range   Glucose-Capillary 101 (*) 70 - 99 mg/dL   Comment 1 Documented in Chart     Comment 2 Notify RN    GLUCOSE, CAPILLARY     Status: Abnormal   Collection Time    02/06/13 12:06 AM      Result Value Range   Glucose-Capillary 113 (*) 70 - 99 mg/dL   Comment 1 Documented in Chart     Comment 2 Notify RN    GLUCOSE, CAPILLARY     Status: Abnormal   Collection Time    02/06/13  3:43 AM      Result Value Range   Glucose-Capillary 105 (*) 70 - 99 mg/dL   Comment 1 Documented in Chart     Comment 2 Notify RN    CBC     Status: Abnormal   Collection Time    02/06/13  5:15 AM      Result Value Range   WBC 21.6 (*) 4.0 - 10.5 K/uL   RBC 4.09  3.87 - 5.11 MIL/uL   Hemoglobin 8.7 (*) 12.0 - 15.0 g/dL   Comment: DELTA CHECK NOTED   HCT 26.9 (*) 36.0 - 46.0 %   MCV 65.8 (*) 78.0 - 100.0 fL   MCH 21.3 (*) 26.0 - 34.0 pg   MCHC 32.3  30.0 - 36.0 g/dL   RDW 18.0 (*) 11.5 - 15.5 %   Platelets 297  150 - 400 K/uL  BASIC METABOLIC PANEL     Status: Abnormal   Collection Time    02/06/13  5:15 AM        Result Value Range   Sodium 142  137 - 147 mEq/L   Potassium 3.8  3.7 - 5.3 mEq/L   Comment: DELTA CHECK NOTED   Chloride 111  96 - 112 mEq/L   CO2 19  19 - 32 mEq/L   Glucose, Bld 98  70 - 99 mg/dL   BUN 17  6 - 23 mg/dL   Creatinine, Ser 1.89 (*) 0.50 - 1.10 mg/dL   Calcium 7.8 (*) 8.4 - 10.5 mg/dL   GFR calc non Af Amer 27 (*) >90 mL/min   GFR calc Af Amer 32 (*) >90 mL/min   Comment: (NOTE)     The eGFR has been calculated using the CKD EPI equation.     This calculation has not been validated in all clinical situations.     eGFR's persistently <90 mL/min signify possible Chronic Kidney     Disease.  GLUCOSE, CAPILLARY     Status: None   Collection Time    02/06/13  7:31 AM      Result Value Range   Glucose-Capillary 81  70 - 99 mg/dL  CORTISOL     Status: None   Collection Time    02/06/13  8:41 AM      Result Value Range   Cortisol, Plasma 16.6     Comment: (NOTE)     AM:  4.3 - 22.4 ug/dL     PM:  3.1 - 16.7 ug/dL     Performed at [facility]  GLUCOSE, CAPILLARY     Status: Abnormal   Collection Time    02/06/13 11:40 AM      Result Value Range   Glucose-Capillary 123 (*) 70 - 99 mg/dL   Comment 1 Notify RN    GLUCOSE, CAPILLARY     Status:  Abnormal   Collection Time    02/06/13  3:33 PM      Result Value Range   Glucose-Capillary 149 (*) 70 - 99 mg/dL   Comment 1 Notify RN    HEMOGLOBIN AND HEMATOCRIT, BLOOD     Status: Abnormal   Collection Time    02/06/13  4:36 PM      Result Value Range   Hemoglobin 9.3 (*) 12.0 - 15.0 g/dL   HCT 28.6 (*) 36.0 - 46.0 %  GLUCOSE, CAPILLARY     Status: None   Collection Time    02/06/13  7:53 PM      Result Value Range   Glucose-Capillary 99  70 - 99 mg/dL   Comment 1 Documented in Chart     Comment 2 Notify RN    GLUCOSE, CAPILLARY     Status: None   Collection Time    02/06/13 11:39 PM      Result Value Range   Glucose-Capillary 90  70 - 99 mg/dL   Comment 1 Documented in Chart     Comment 2 Notify RN    BASIC METABOLIC PANEL     Status: Abnormal   Collection Time    02/07/13  4:42 AM      Result Value Range   Sodium 146  137 - 147 mEq/L   Potassium 3.3 (*) 3.7 - 5.3 mEq/L   Chloride 113 (*) 96 - 112 mEq/L   CO2 21  19 - 32 mEq/L   Glucose, Bld 110 (*) 70 - 99 mg/dL   BUN 17  6 - 23 mg/dL   Creatinine, Ser 1.63 (*) 0.50 - 1.10 mg/dL   Calcium 8.6    8.4 - 10.5 mg/dL   GFR calc non Af Amer 33 (*) >90 mL/min   GFR calc Af Amer 38 (*) >90 mL/min   Comment: (NOTE)     The eGFR has been calculated using the CKD EPI equation.     This calculation has not been validated in all clinical situations.     eGFR's persistently <90 mL/min signify possible Chronic Kidney     Disease.  CBC     Status: Abnormal   Collection Time    02/07/13  4:42 AM      Result Value Range   WBC 14.9 (*) 4.0 - 10.5 K/uL   RBC 3.86 (*) 3.87 - 5.11 MIL/uL   Hemoglobin 8.5 (*) 12.0 - 15.0 g/dL   HCT 25.1 (*) 36.0 - 46.0 %   MCV 65.0 (*) 78.0 - 100.0 fL   MCH 22.0 (*) 26.0 - 34.0 pg   MCHC 33.9  30.0 - 36.0 g/dL   RDW 18.3 (*) 11.5 - 15.5 %   Platelets 261  150 - 400 K/uL  PROCALCITONIN     Status: None   Collection Time    02/07/13  4:42 AM      Result Value Range   Procalcitonin 3.80      Comment:            Interpretation:     PCT > 2 ng/mL:     Systemic infection (sepsis) is likely,     unless other causes are known.     (NOTE)             ICU PCT Algorithm               Non ICU PCT Algorithm        ----------------------------     ------------------------------             PCT < 0.25 ng/mL                 PCT < 0.1 ng/mL         Stopping of antibiotics            Stopping of antibiotics           strongly encouraged.               strongly encouraged.        ----------------------------     ------------------------------           PCT level decrease by               PCT < 0.25 ng/mL           >= 80% from peak PCT           OR PCT 0.25 - 0.5 ng/mL          Stopping of antibiotics                                                 encouraged.         Stopping of antibiotics               encouraged.        ----------------------------     ------------------------------           PCT level decrease by  PCT >= 0.25 ng/mL           < 80% from peak PCT            AND PCT >= 0.5 ng/mL            Continuing antibiotics                                                  encouraged.           Continuing antibiotics                encouraged.        ----------------------------     ------------------------------         PCT level increase compared          PCT > 0.5 ng/mL             with peak PCT AND              PCT >= 0.5 ng/mL             Escalation of antibiotics                                              strongly encouraged.          Escalation of antibiotics            strongly encouraged.  GLUCOSE, CAPILLARY     Status: Abnormal   Collection Time    02/07/13  7:27 AM      Result Value Range   Glucose-Capillary 111 (*) 70 - 99 mg/dL   Comment 1 Notify RN      Imaging / Studies: Dg Chest Port 1 View  02/07/2013   CLINICAL DATA:  Atelectasis and status post partial colectomy.  EXAM: PORTABLE CHEST - 1 VIEW  COMPARISON:  02/05/2013  FINDINGS: Stable low  lung volumes with bibasilar atelectasis. No overt edema, consolidation or pleural fluid is seen. The heart size is stable. There is stable positioning of a left jugular central line in the lower SVC.  IMPRESSION: Stable low volumes with bibasilar atelectasis.   Electronically Signed   By: Aletta Edouard M.D.   On: 02/07/2013 08:50   Dg Chest Port 1 View  02/05/2013   CLINICAL DATA:  Line placement.  EXAM: PORTABLE CHEST - 1 VIEW  COMPARISON:  Same day.  FINDINGS: Stable cardiomediastinal silhouette. Interval placement of left internal jugular catheter line with distal tip in expected position of the cavoatrial junction. No pneumothorax is noted. No pleural effusion is noted. No acute pulmonary disease is noted.  IMPRESSION: No pneumothorax seen status post left internal jugular central line placement.   Electronically Signed   By: Sabino Dick M.D.   On: 02/05/2013 17:35    Medications / Allergies: per chart  Antibiotics: Anti-infectives   Start     Dose/Rate Route Frequency Ordered Stop   02/07/13 1000  vancomycin (VANCOCIN) 1,500 mg in sodium chloride 0.9 % 500 mL IVPB  Status:  Discontinued     1,500 mg 250 mL/hr over 120 Minutes Intravenous Every 24 hours 02/07/13 0659 02/07/13 0911   02/06/13 1000  vancomycin (VANCOCIN) IVPB 1000 mg/200 mL premix  Status:  Discontinued  1,000 mg 200 mL/hr over 60 Minutes Intravenous Every 24 hours 02/05/13 0757 02/07/13 0659   02/05/13 0830  vancomycin (VANCOCIN) 2,000 mg in sodium chloride 0.9 % 500 mL IVPB     2,000 mg 250 mL/hr over 120 Minutes Intravenous  Once 02/05/13 0757 02/05/13 1100   02/05/13 0815  piperacillin-tazobactam (ZOSYN) IVPB 3.375 g     3.375 g 12.5 mL/hr over 240 Minutes Intravenous 3 times per day 02/05/13 0757     02/04/13 1515  cefoTEtan (CEFOTAN) 1 g in dextrose 5 % 50 mL IVPB  Status:  Discontinued     1 g 100 mL/hr over 30 Minutes Intravenous Every 12 hours 02/04/13 1511 02/05/13 0851   02/04/13 1045  cefoTEtan (CEFOTAN)  2 g in dextrose 5 % 50 mL IVPB     2 g 100 mL/hr over 30 Minutes Intravenous On call to O.R. 02/04/13 1039 02/04/13 1030      Assessment/Plan Sigmoid colon mass S/p partial colectomy with right colostomy(Dr. Tsuei 02/04/13) Back of clears, zofran prn for nausea.  Abdomen is soft, ostomy with 1475ml recorded yesterday 150ml today. Does not appear to have an ileus/obstruction. Ostomy may need revision  Emina Riebock, ANP-BC [facility] Pager [phone] Office [phone]  02/07/2013 9:36 AM    

## 2013-02-07 NOTE — Progress Notes (Signed)
Name: [name] MRN: [id number] DOB: 1950/06/06    ADMISSION DATE:  02/02/2013  REFERRING MD :  Shane Crutch  CHIEF COMPLAINT:  Low blood pressure  BRIEF PATIENT DESCRIPTION:  63 yo female admitted with abdominal pain from colon mass.    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  C/o nausea/vomiting x 2, heartburn.  Denies chest pain.  Breathing okay.  VITAL SIGNS: Temp:  [98 F (36.7 C)-99.7 F (37.6 C)] 98.2 F (36.8 C) (01/11 0729) Pulse Rate:  [86-118] 103 (01/11 0700) Resp:  [20-34] 28 (01/11 0700) BP: (91-118)/(45-88) 110/58 mmHg (01/11 0400) SpO2:  [87 %-100 %] 97 % (01/11 0835) Arterial Line BP: (85-127)/(45-77) 92/56 mmHg (01/11 0700) Weight:  [245 lb 13 oz (111.5 kg)] 245 lb 13 oz (111.5 kg) (01/11 0500)   INTAKE / OUTPUT: Intake/Output     01/10 0701 - 01/11 0700 01/11 0701 - 01/12 0700   P.O. 295    I.V. (mL/kg) 2656.9 (23.8)    IV Piggyback 437.5    Total Intake(mL/kg) 3389.4 (30.4)    Urine (mL/kg/hr) 2180 (0.8)    Emesis/NG output 500 (0.2)    Stool 1475 (0.6)    Total Output 4155     Net -765.7           HEMODYNAMICS CVP:  [7 mmHg-17 mmHg] 9 mmHg  PHYSICAL EXAMINATION: General: appears ill Neuro: normal strength, follows commands HEENT: no sinus tenderness, dry oral mucosa Cardiovascular:  Regular, tachycardic, no murmur Lungs:  No wheeze/rales Abdomen:  Wound dressing clean, colostomy in place Musculoskeletal:  No edema Skin:  No  rashes  LABS:  CBC  Recent Labs Lab 02/05/13 0525 02/06/13 0515 02/06/13 1636 02/07/13 0442  WBC 23.3* 21.6*  --  14.9*  HGB 10.8* 8.7* 9.3* 8.5*  HCT 33.8* 26.9* 28.6* 25.1*  PLT 349 297  --  261   Coag's  Recent Labs Lab 02/02/13 2054  INR 1.07   BMET  Recent Labs Lab 02/05/13 0525 02/06/13 0515 02/07/13 0442  NA 142 142 146  K 4.6 3.8 3.3*  CL 110 111 113*  CO2 20 19 21   BUN 17 17 17   CREATININE 1.90* 1.89* 1.63*  GLUCOSE 123* 98 110*   Electrolytes  Recent Labs Lab 02/03/13 0428 02/05/13 0525 02/06/13 0515 02/07/13 0442  CALCIUM 9.1 7.7* 7.8* 8.6  MG 2.2  --   --   --    Sepsis Markers  Recent Labs Lab 02/05/13 0849 02/05/13 0850 02/05/13 1533 02/07/13 0442  LATICACIDVEN  --  0.9 1.0  --   PROCALCITON 5.48  --   --  3.80   Liver Enzymes  Recent Labs Lab 02/02/13 0150 02/03/13 0428  AST 27 17  ALT 25 18  ALKPHOS 143* 111  BILITOT 0.3 0.2*  ALBUMIN 3.4* 2.7*   Glucose  Recent Labs Lab 02/06/13 0731 02/06/13 1140 02/06/13 1533 02/06/13 1953 02/06/13 2339 02/07/13 0727  GLUCAP 81 123* 149* 99 90 111*    Imaging Dg Chest  Port 1 View  02/07/2013   CLINICAL DATA:  Atelectasis and status post partial colectomy.  EXAM: PORTABLE CHEST - 1 VIEW  COMPARISON:  02/05/2013  FINDINGS: Stable low lung volumes with bibasilar atelectasis. No overt edema, consolidation or pleural fluid is seen. The heart size is stable. There is stable positioning of a left jugular central line in the lower SVC.  IMPRESSION: Stable low volumes with bibasilar atelectasis.   Electronically Signed   By: Aletta Edouard M.D.   On: 02/07/2013 08:50   Dg Chest Port 1 View  02/05/2013   CLINICAL DATA:  Line placement.  EXAM: PORTABLE CHEST - 1 VIEW  COMPARISON:  Same day.  FINDINGS: Stable cardiomediastinal silhouette. Interval placement of left internal jugular catheter line with distal tip in expected position of the cavoatrial junction. No pneumothorax is noted.  No pleural effusion is noted. No acute pulmonary disease is noted.  IMPRESSION: No pneumothorax seen status post left internal jugular central line placement.   Electronically Signed   By: Sabino Dick M.D.   On: 02/05/2013 17:35   Dg Chest Port 1 View  02/05/2013   CLINICAL DATA:  Weakness, cough  EXAM: PORTABLE CHEST - 1 VIEW  COMPARISON:  DG ABD ACUTE W/CHEST dated 01/27/2013  FINDINGS: There are low lung volumes. There is crowding of the central pulmonary markings secondary to low lung volumes. There is no focal consolidation, pleural effusion or pneumothorax. Stable cardiomediastinal silhouette. Unremarkable osseous structures.  IMPRESSION: Low lung volumes with crowding of the central pulmonary markings. No active disease.   Electronically Signed   By: Kathreen Devoid   On: 02/05/2013 09:28    ASSESSMENT / PLAN:  PULMONARY A: Hx of OSA, Asthma. P:   -CPAP qhs -oxygen to keep SpO2 > 92% -brovana, pulmicort, prn albuterol -hold advair, singular for now  CARDIOVASCULAR A:  Hypotension >> likely from hypovolemia, less likely sepsis >> off pressors 1/10. P:  -continue IV fluids -d/c a line -check Echo  RENAL A:   Acute kidney injury likely 2nd to hypovolemia >> improving. P:   -monitor renal fx, urine outpt, electrolytes while getting IV fluids  GASTROINTESTINAL A:   Colon cancer s/p Lt hemicolectomy, end colostomy and Hartman's pouch. Nutrition. Nausea/vomiting/dyspepsia. P:   -post -op care, nutrition per CCS -continue protonix -prn zofran, phenergan  HEMATOLOGIC A:   Anemia of chronic disease >> likely also hemodiluational. P:  -f/u CBC -transfuse for Hb < 7 -SCD, lovenox for DVT prevention  INFECTIOUS A:   ?sepsis as cause of hypotension >> no obvious source of infection. P:   -continue zosyn for now -d/c vancomycin  ENDOCRINE A:   Relative adrenal insufficiency >> cortisol 16.6 from 1/10.   P:   -started solu cortef 1/10 >> plan to start weaning off  1/12  NEUROLOGIC A:   Post-op pain. P:   -prn pain meds  Keep in ICU today until nausea/vomiting issues resolved.  Chesley Mires, MD [facility] Pulmonary/Critical Care 02/07/2013, 9:04 AM Pager:  [phone] After 3pm call: [phone]

## 2013-02-07 NOTE — Progress Notes (Signed)
  Echocardiogram 2D Echocardiogram has been performed.  [name] FRANCES 02/07/2013, 11:43 AM

## 2013-02-07 NOTE — Progress Notes (Signed)
Dr. Tamala Julian (elink) and Dr.Wyatt called regarding vomiting and stoma appearance. Orders received.

## 2013-02-08 ENCOUNTER — Encounter (HOSPITAL_COMMUNITY): Payer: Self-pay | Admitting: Surgery

## 2013-02-08 DIAGNOSIS — N179 Acute kidney failure, unspecified: Secondary | ICD-10-CM | POA: Diagnosis not present

## 2013-02-08 DIAGNOSIS — I959 Hypotension, unspecified: Secondary | ICD-10-CM | POA: Diagnosis not present

## 2013-02-08 DIAGNOSIS — R109 Unspecified abdominal pain: Secondary | ICD-10-CM | POA: Diagnosis not present

## 2013-02-08 DIAGNOSIS — K56609 Unspecified intestinal obstruction, unspecified as to partial versus complete obstruction: Secondary | ICD-10-CM | POA: Diagnosis not present

## 2013-02-08 LAB — GLUCOSE, CAPILLARY
Glucose-Capillary: 100 mg/dL — ABNORMAL HIGH (ref 70–99)
Glucose-Capillary: 106 mg/dL — ABNORMAL HIGH (ref 70–99)
Glucose-Capillary: 123 mg/dL — ABNORMAL HIGH (ref 70–99)

## 2013-02-08 LAB — BASIC METABOLIC PANEL
BUN: 24 mg/dL — ABNORMAL HIGH (ref 6–23)
CO2: 20 mEq/L (ref 19–32)
Calcium: 9 mg/dL (ref 8.4–10.5)
Chloride: 115 mEq/L — ABNORMAL HIGH (ref 96–112)
Creatinine, Ser: 1.46 mg/dL — ABNORMAL HIGH (ref 0.50–1.10)
GFR calc Af Amer: 43 mL/min — ABNORMAL LOW (ref >90)
GFR, EST NON AFRICAN AMERICAN: 37 mL/min — AB (ref >90)
Glucose, Bld: 108 mg/dL — ABNORMAL HIGH (ref 70–99)
Potassium: 3.1 mEq/L — ABNORMAL LOW (ref 3.7–5.3)
SODIUM: 147 meq/L (ref 137–147)

## 2013-02-08 LAB — CBC
HCT: 22.8 % — ABNORMAL LOW (ref 36.0–46.0)
Hemoglobin: 7.8 g/dL — ABNORMAL LOW (ref 12.0–15.0)
MCH: 22 pg — ABNORMAL LOW (ref 26.0–34.0)
MCHC: 34.2 g/dL (ref 30.0–36.0)
MCV: 64.4 fL — ABNORMAL LOW (ref 78.0–100.0)
PLATELETS: 271 10*3/uL (ref 150–400)
RBC: 3.54 MIL/uL — AB (ref 3.87–5.11)
RDW: 18.1 % — AB (ref 11.5–15.5)
WBC: 12.8 10*3/uL — ABNORMAL HIGH (ref 4.0–10.5)

## 2013-02-08 LAB — POCT I-STAT 4, (NA,K, GLUC, HGB,HCT)
Glucose, Bld: 133 mg/dL — ABNORMAL HIGH (ref 70–99)
HEMATOCRIT: 31 % — AB (ref 36.0–46.0)
Hemoglobin: 10.5 g/dL — ABNORMAL LOW (ref 12.0–15.0)
Potassium: 4 mEq/L (ref 3.7–5.3)
Sodium: 140 mEq/L (ref 137–147)

## 2013-02-08 LAB — CLOSTRIDIUM DIFFICILE BY PCR: Toxigenic C. Difficile by PCR: NEGATIVE

## 2013-02-08 MED ORDER — POTASSIUM CHLORIDE 10 MEQ/100ML IV SOLN
10.0000 meq | INTRAVENOUS | Status: AC
  Administered 2013-02-08 (×3): 10 meq via INTRAVENOUS
  Filled 2013-02-08 (×3): qty 100

## 2013-02-08 MED ORDER — BOOST / RESOURCE BREEZE PO LIQD
1.0000 | Freq: Three times a day (TID) | ORAL | Status: DC
  Administered 2013-02-08 – 2013-02-10 (×4): 1 via ORAL

## 2013-02-08 MED ORDER — HYDROCORTISONE SOD SUCCINATE 100 MG IJ SOLR
25.0000 mg | Freq: Four times a day (QID) | INTRAMUSCULAR | Status: DC
  Administered 2013-02-08 – 2013-02-09 (×4): 25 mg via INTRAVENOUS
  Filled 2013-02-08 (×8): qty 0.5

## 2013-02-08 MED ORDER — SODIUM CHLORIDE 0.45 % IV SOLN
INTRAVENOUS | Status: DC
  Administered 2013-02-08 (×2): via INTRAVENOUS
  Administered 2013-02-09: 1 mL via INTRAVENOUS
  Administered 2013-02-11: 10 mL/h via INTRAVENOUS

## 2013-02-08 NOTE — Consult Note (Signed)
WOC ostomy follow up Stoma type/location: Colostomy to RLQ Stomal assessment/size: Stoma red and viable, 1 1/2 inches, slightly below skin level Peristomal assessment: intact skin surrounding Output 50cc green liquid, no stool or flatus Ostomy pouching: 1pc. Education provided: No family at bedside for pouch change demonstration.  Pt watched and asked appropriate questions.  Applied one piece convex pouch.  She is able to open and close velcro to empty.  Supplies at bedside for staff nurse use.  [place] team will plan to follow and continue teaching sessions when stable and out of ICU. Thank-you, Julien Girt MSN, [place], [place], [place], [place]

## 2013-02-08 NOTE — Progress Notes (Signed)
Name: [name] MRN: [id number] DOB: June 30, 1950    ADMISSION DATE:  02/02/2013  REFERRING MD :  Shane Crutch  CHIEF COMPLAINT:  Low blood pressure  BRIEF PATIENT DESCRIPTION:  62 yo female admitted with abdominal pain from colon mass.    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 Doppler parameters are consistent with abnormal left ventricular relaxation (grade 1 diastolic dysfunction). - Pulmonary arteries: PASP is bordeline elevated. PA peak pressure: 16mm Hg (S).    LINES / TUBES: Aline 1/8 >> 1/11 Lt IJ CVL 1/9 >>  CULTURES: Blood 1/9 >> Urine 1/9 >> negative  ANTIBIOTICS: Cefotetan 1/8 >> 1/9 Zosyn 1/9 >> Vancomycin 1/9 >> 1/11  SUBJECTIVE:  Off pressors, output from NGT noted  VITAL SIGNS: Temp:  [97.8 F (36.6 C)-98.2 F (36.8 C)] 98.1 F (36.7 C) (01/12 0809) Pulse Rate:  [71-119] 71 (01/12 0900) Resp:  [12-29] 25 (01/12 0900) BP: (92-127)/(42-82) 114/58 mmHg (01/12 0900) SpO2:  [90 %-100 %] 96 % (01/12 0900) Weight:  [109.453 kg (241 lb 4.8 oz)] 109.453 kg (241 lb 4.8 oz) (01/12 0500)   INTAKE / OUTPUT: Intake/Output     01/11 0701 - 01/12 0700 01/12 0701 - 01/13 0700   P.O.     I.V. (mL/kg) 2020 (18.5) 200 (1.8)   NG/GT 60 30   IV Piggyback 312.5 25   Total Intake(mL/kg) 2392.5 (21.9) 255 (2.3)   Urine (mL/kg/hr) 1315 (0.5) 100 (0.3)   Emesis/NG output 750 (0.3)    Stool 1600 (0.6) 225 (0.7)   Total Output 3665 325   Net -1272.5 -70        Stool Occurrence 1 x     HEMODYNAMICS CVP:  [1 mmHg-14 mmHg] 10 mmHg  PHYSICAL EXAMINATION: General: in chair Neuro: normal  strength, follows commands HEENT: dry jvd low Cardiovascular:S1 s2 rrr Lungs:  CTA Abdomen:  Wound clean, colostomy in place Musculoskeletal:  No edema Skin:  No rashes  LABS:  CBC  Recent Labs Lab 02/06/13 0515 02/06/13 1636 02/07/13 0442 02/08/13 0500  WBC 21.6*  --  14.9* 12.8*  HGB 8.7* 9.3* 8.5* 7.8*  HCT 26.9* 28.6* 25.1* 22.8*  PLT 297  --  261 271   Coag's  Recent Labs Lab 02/02/13 2054  INR 1.07   BMET  Recent Labs Lab 02/06/13 0515 02/07/13 0442 02/08/13 0500  NA 142 146 147  K 3.8 3.3* 3.1*  CL 111 113* 115*  CO2 19 21 20   BUN 17 17 24*  CREATININE 1.89* 1.63* 1.46*  GLUCOSE 98 110* 108*   Electrolytes  Recent Labs Lab 02/03/13 0428  02/06/13 0515 02/07/13 0442 02/08/13 0500  CALCIUM 9.1  < > 7.8* 8.6 9.0  MG 2.2  --   --   --   --   < > = values in this interval not displayed. Sepsis Markers  Recent Labs Lab 02/05/13 0849 02/05/13 0850 02/05/13 1533 02/07/13 0442  LATICACIDVEN  --  0.9 1.0  --   PROCALCITON 5.48  --   --  3.80   Liver Enzymes  Recent Labs Lab 02/02/13 0150 02/03/13 0428  AST 27 17  ALT 25 18  ALKPHOS 143* 111  BILITOT 0.3 0.2*  ALBUMIN 3.4* 2.7*   Glucose  Recent Labs Lab 02/07/13 0339 02/07/13 0727 02/07/13 1205 02/07/13 1545 02/07/13 1941 02/08/13 0806  GLUCAP 102* 111* 118* 110* 112* 100*    Imaging Dg Chest Port 1 View  02/07/2013   CLINICAL DATA:  Atelectasis and status post partial colectomy.  EXAM: PORTABLE CHEST - 1 VIEW  COMPARISON:  02/05/2013  FINDINGS: Stable low lung volumes with bibasilar atelectasis. No overt edema, consolidation or pleural fluid is seen. The heart size is stable. There is stable positioning of a left jugular central line in the lower SVC.  IMPRESSION: Stable low volumes with bibasilar atelectasis.   Electronically Signed   By: Aletta Edouard M.D.   On: 02/07/2013 08:50   Dg Abd Portable 1v  02/07/2013   CLINICAL DATA:  Abdominal pain, nausea and vomiting.  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  EXAM: PORTABLE ABDOMEN - 1 VIEW  COMPARISON:  CT on 02/02/2013  FINDINGS: Mildly prominent left-sided small bowel loops may be consistent with focal ileus. No colonic dilatation is identified.  IMPRESSION: Possible mild small bowel ileus.   Electronically Signed   By: Aletta Edouard M.D.   On: 02/07/2013 12:41    ASSESSMENT / PLAN:  PULMONARY A: Hx of OSA, Asthma. P:   -CPAP qhs -oxygen to keep SpO2 > 92% -brovana, pulmicort, prn albuterol -holding  advair, singular for now -no overt failure on pcxr, continued pos balance as goals  CARDIOVASCULAR A:  Hypotension >> likely from hypovolemia Rel Ai present P:  -continue IV fluids and increase as output was extensive from NGT / stool and neg 1.2 liters last 24 hrs -d/c line likely in am  -check Echo - reviewed, will need pa pressures re evaluation and follow up -off pressors, reduce steroids over 4-7 days  RENAL A:   Acute kidney injury likely 2nd to hypovolemia >> improving. NON AG acidosis from saline P:   -increase and change fluids to 1/2 NS -avoid saline further -no role bicarb  GASTROINTESTINAL A:   Colon cancer s/p Lt hemicolectomy, end colostomy and Hartman's pouch. Nutrition. Nausea/vomiting/dyspepsia. P:   -post -op care, nutrition per CCS -continue protonix -prn zofran, phenergan -NGt to remain   HEMATOLOGIC A:   Anemia of chronic disease >> likely also hemodiluational. P:  -f/u CBC as with pos balance may drop further -transfuse for Hb < 7 -SCD, lovenox for DVT prevention, crt in am   INFECTIOUS A:   No evidence infection  P:   -consider dc zosyn , appears as hypovolemia, remains culture neg -d/c vancomycin  ENDOCRINE A:   Relative adrenal insufficiency >> cortisol 16.6 from 1/10.   P:   -started solu cortef 1/10 >> reduce, see cvs  NEUROLOGIC A:   Post-op pain. P:   -prn pain meds  To sdu, back to triad, change  fluids 1/2 NS, increase  Lavon Paganini. Titus Mould, MD, [place] Pgr: [facility]
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
## 2013-02-08 NOTE — Progress Notes (Signed)
4 Days Post-Op  Subjective: Pt awake.  Some N/V last night Ostomy has stool  Objective: Vital signs in last 24 hours: Temp:  [97.8 F (36.6 C)-98.2 F (36.8 C)] 98.1 F (36.7 C) (01/12 0809) Pulse Rate:  [71-119] 71 (01/12 0800) Resp:  [12-31] 20 (01/12 0800) BP: (92-127)/(42-82) 121/60 mmHg (01/12 0800) SpO2:  [89 %-100 %] 99 % (01/12 0800) Arterial Line BP: (87)/(71) 87/71 mmHg (01/11 0900) Weight:  [241 lb 4.8 oz (109.453 kg)] 241 lb 4.8 oz (109.453 kg) (01/12 0500) Last BM Date: 02/08/13 (colostomy)  Intake/Output from previous day: 01/11 0701 - 01/12 0700 In: 2392.5 [I.V.:2020; NG/GT:60; IV Piggyback:312.5] Out: 3665 [Urine:1315; Emesis/NG output:750; Stool:1600] Intake/Output this shift: Total I/O In: 142.5 [I.V.:100; NG/GT:30; IV Piggyback:12.5] Out: 325 [Urine:100; Stool:225]  Incision/Wound:C/D/I  Ostomy retracted but viable.   Lab Results:   Recent Labs  02/07/13 0442 02/08/13 0500  WBC 14.9* 12.8*  HGB 8.5* 7.8*  HCT 25.1* 22.8*  PLT 261 271   BMET  Recent Labs  02/07/13 0442 02/08/13 0500  NA 146 147  K 3.3* 3.1*  CL 113* 115*  CO2 21 20  GLUCOSE 110* 108*  BUN 17 24*  CREATININE 1.63* 1.46*  CALCIUM 8.6 9.0   PT/INR No results found for this basename: LABPROT, INR,  in the last 72 hours ABG No results found for this basename: PHART, PCO2, PO2, HCO3,  in the last 72 hours  Studies/Results: Dg Chest Port 1 View  02/07/2013   CLINICAL DATA:  Atelectasis and status post partial colectomy.  EXAM: PORTABLE CHEST - 1 VIEW  COMPARISON:  02/05/2013  FINDINGS: Stable low lung volumes with bibasilar atelectasis. No overt edema, consolidation or pleural fluid is seen. The heart size is stable. There is stable positioning of a left jugular central line in the lower SVC.  IMPRESSION: Stable low volumes with bibasilar atelectasis.   Electronically Signed   By: Aletta Edouard M.D.   On: 02/07/2013 08:50   Dg Abd Portable 1v  02/07/2013   CLINICAL DATA:   Abdominal pain, nausea and vomiting. 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  EXAM: PORTABLE ABDOMEN - 1 VIEW  COMPARISON:  CT on 02/02/2013  FINDINGS: Mildly prominent left-sided small bowel loops may be consistent with focal ileus. No colonic dilatation is identified.  IMPRESSION: Possible mild small bowel ileus.   Electronically Signed   By: Aletta Edouard M.D.   On: 02/07/2013 12:41    Anti-infectives: Anti-infectives   Start     Dose/Rate Route Frequency Ordered Stop   02/07/13 1000  vancomycin (VANCOCIN) 1,500 mg in sodium chloride 0.9 % 500 mL IVPB  Status:  Discontinued     1,500 mg 250 mL/hr over 120 Minutes Intravenous Every 24 hours 02/07/13 0659 02/07/13 0911   02/06/13 1000  vancomycin (VANCOCIN) IVPB 1000 mg/200 mL premix  Status:  Discontinued     1,000 mg 200 mL/hr over 60 Minutes Intravenous Every 24 hours 02/05/13 0757 02/07/13 0659   02/05/13 0830  vancomycin (VANCOCIN) 2,000 mg in sodium chloride 0.9 % 500 mL IVPB     2,000 mg 250 mL/hr over 120 Minutes Intravenous  Once 02/05/13 0757 02/05/13 1100   02/05/13 0815  piperacillin-tazobactam (ZOSYN) IVPB 3.375 g     3.375 g 12.5 mL/hr over 240 Minutes Intravenous 3 times per day 02/05/13 0757     02/04/13 1515  cefoTEtan (CEFOTAN) 1 g in dextrose 5 % 50 mL IVPB  Status:  Discontinued  1 g 100 mL/hr over 30 Minutes Intravenous Every 12 hours 02/04/13 1511 02/05/13 0851   02/04/13 1045  cefoTEtan (CEFOTAN) 2 g in dextrose 5 % 50 mL IVPB     2 g 100 mL/hr over 30 Minutes Intravenous On call to O.R. 02/04/13 1039 02/04/13 1030      Assessment/Plan: s/p Procedure(s): PARTIAL COLECTOMY (N/A) COLOSTOMY (Right) D/C foley Replace K Hopefully NGT out Tuesday Ostomy viable but retracted.  Will follow.  oob ABL anemia  Follow for now  LOS: 6 days    [name] A. 02/08/2013

## 2013-02-09 ENCOUNTER — Inpatient Hospital Stay (HOSPITAL_COMMUNITY): Payer: Medicare Other

## 2013-02-09 DIAGNOSIS — R0989 Other specified symptoms and signs involving the circulatory and respiratory systems: Secondary | ICD-10-CM | POA: Diagnosis not present

## 2013-02-09 DIAGNOSIS — K56609 Unspecified intestinal obstruction, unspecified as to partial versus complete obstruction: Secondary | ICD-10-CM

## 2013-02-09 LAB — COMPREHENSIVE METABOLIC PANEL
ALT: 14 U/L (ref 0–35)
AST: 15 U/L (ref 0–37)
Albumin: 2 g/dL — ABNORMAL LOW (ref 3.5–5.2)
Alkaline Phosphatase: 124 U/L — ABNORMAL HIGH (ref 39–117)
BILIRUBIN TOTAL: 0.5 mg/dL (ref 0.3–1.2)
BUN: 23 mg/dL (ref 6–23)
CO2: 19 meq/L (ref 19–32)
CREATININE: 1.36 mg/dL — AB (ref 0.50–1.10)
Calcium: 9.3 mg/dL (ref 8.4–10.5)
Chloride: 116 mEq/L — ABNORMAL HIGH (ref 96–112)
GFR calc non Af Amer: 40 mL/min — ABNORMAL LOW (ref >90)
GFR, EST AFRICAN AMERICAN: 47 mL/min — AB (ref >90)
GLUCOSE: 92 mg/dL (ref 70–99)
Potassium: 2.6 mEq/L — CL (ref 3.7–5.3)
Sodium: 149 mEq/L — ABNORMAL HIGH (ref 137–147)
Total Protein: 6.1 g/dL (ref 6.0–8.3)

## 2013-02-09 LAB — CBC WITH DIFFERENTIAL/PLATELET
Basophils Absolute: 0 10*3/uL (ref 0.0–0.1)
Basophils Relative: 0 % (ref 0–1)
Eosinophils Absolute: 0 10*3/uL (ref 0.0–0.7)
Eosinophils Relative: 0 % (ref 0–5)
HCT: 26.2 % — ABNORMAL LOW (ref 36.0–46.0)
Hemoglobin: 8.8 g/dL — ABNORMAL LOW (ref 12.0–15.0)
Lymphocytes Relative: 12 % (ref 12–46)
Lymphs Abs: 1.5 10*3/uL (ref 0.7–4.0)
MCH: 21.5 pg — AB (ref 26.0–34.0)
MCHC: 33.6 g/dL (ref 30.0–36.0)
MCV: 63.9 fL — ABNORMAL LOW (ref 78.0–100.0)
MONO ABS: 1.9 10*3/uL — AB (ref 0.1–1.0)
MONOS PCT: 15 % — AB (ref 3–12)
NEUTROS PCT: 73 % (ref 43–77)
Neutro Abs: 9.2 10*3/uL — ABNORMAL HIGH (ref 1.7–7.7)
Platelets: 333 10*3/uL (ref 150–400)
RBC: 4.1 MIL/uL (ref 3.87–5.11)
RDW: 18.3 % — ABNORMAL HIGH (ref 11.5–15.5)
WBC: 12.6 10*3/uL — AB (ref 4.0–10.5)

## 2013-02-09 LAB — GLUCOSE, CAPILLARY: Glucose-Capillary: 101 mg/dL — ABNORMAL HIGH (ref 70–99)

## 2013-02-09 LAB — PROCALCITONIN: Procalcitonin: 1.66 ng/mL

## 2013-02-09 MED ORDER — HYDROCORTISONE SOD SUCCINATE 100 MG IJ SOLR
25.0000 mg | Freq: Three times a day (TID) | INTRAMUSCULAR | Status: DC
  Administered 2013-02-09 – 2013-02-10 (×4): 25 mg via INTRAVENOUS
  Filled 2013-02-09 (×6): qty 0.5

## 2013-02-09 MED ORDER — POTASSIUM CHLORIDE 10 MEQ/50ML IV SOLN
10.0000 meq | INTRAVENOUS | Status: AC
  Administered 2013-02-09 (×6): 10 meq via INTRAVENOUS
  Filled 2013-02-09 (×6): qty 50

## 2013-02-09 NOTE — Progress Notes (Signed)
Placed patient on CPAP auto titrate with 2 L of O2 bled in to apparatus. RT added sterile water to the CPAP for the patient. Patient is tolerating CPAP and vitals and Sats are within normal limits. RT will continue to monitor.

## 2013-02-09 NOTE — Progress Notes (Signed)
[facility] Progress Note Patient Name: DENETRIA LUEVANOS DOB: 10-22-1950 MRN: [id number]  Date of Service  02/09/2013   HPI/Events of Note     eICU Interventions  K replaced   Intervention Category Minor Interventions: Electrolytes abnormality - evaluation and management  Jericca Russett S. 02/09/2013, 5:22 AM

## 2013-02-09 NOTE — Progress Notes (Signed)
CRITICAL VALUE ALERT  Critical value received: K 2.6  Date of notification:  02/09/13  Time of notification:  0510  Critical value read back:yes  Nurse who received alert:  Candyce Churn RN  MD notified (1st page):  Dr. Lamonte Sakai (elink) called  Time of first page:  0510  MD notified (2nd page):  Time of second page:  Responding MD:  n/a  Time MD responded:  n/a

## 2013-02-09 NOTE — Progress Notes (Signed)
[facility] ICU Electrolyte Replacement Protocol  Patient Name: [name] DOB: [birth year] MRN: [id number]  Date of Service  02/09/2013   HPI/Events of Note    Recent Labs Lab 02/03/13 0428  02/05/13 0525 02/06/13 0515 02/07/13 0442 02/08/13 0500 02/09/13 0400  NA 142  < > 142 142 146 147 149*  K 3.7  < > 4.6 3.8 3.3* 3.1* 2.6*  CL 105  --  110 111 113* 115* 116*  CO2 24  --  $R'20 19 21 20 19  'kD$ GLUCOSE 115*  < > 123* 98 110* 108* 92  BUN 16  --  $R'17 17 17 'bP$ 24* 23  CREATININE 1.17*  < > 1.90* 1.89* 1.63* 1.46* 1.36*  CALCIUM 9.1  --  7.7* 7.8* 8.6 9.0 9.3  MG 2.2  --   --   --   --   --   --   < > = values in this interval not displayed.  Estimated Creatinine Clearance: 49.4 ml/min (by C-G formula based on Cr of 1.36).  Intake/Output     01/12 0701 - 01/13 0700   P.O. 470   I.V. (mL/kg) 2300 (21)   NG/GT 60   IV Piggyback 175   Total Intake(mL/kg) 3005 (27.5)   Urine (mL/kg/hr) 850 (0.3)   Emesis/NG output 800 (0.3)   Stool 1125 (0.4)   Total Output 2775   Net +230       Urine Occurrence 1 x    - I/O DETAILED x24h    Total I/O In: 1700 [P.O.:350; I.V.:1350] Out: 1250 [Urine:500; Emesis/NG output:500; Stool:250] - I/O THIS SHIFT    ASSESSMENT   eICURN Interventions  Electrolyte protocol criteria met not due to inadequate urine output.  K+ replaced by MD     ASSESSMENT: [place], Ranessa Kosta Nicole 02/09/2013, 5:18 AM

## 2013-02-09 NOTE — Progress Notes (Signed)
5 Days Post-Op  Subjective: Doing ok.    Objective: Vital signs in last 24 hours: Temp:  [97.6 F (36.4 C)-98.8 F (37.1 C)] 98.8 F (37.1 C) (01/13 0000) Pulse Rate:  [64-108] 106 (01/13 0700) Resp:  [20-31] 31 (01/13 0700) BP: (102-126)/(50-96) 102/56 mmHg (01/13 0400) SpO2:  [93 %-100 %] 97 % (01/13 0700) Weight:  [242 lb 15.2 oz (110.2 kg)] 242 lb 15.2 oz (110.2 kg) (01/13 0500) Last BM Date: 02/08/13 (colostomy)  Intake/Output from previous day: 01/12 0701 - 01/13 0700 In: 3815 [P.O.:670; I.V.:2750; NG/GT:120; IV Piggyback:275] Out: 7893 [Urine:1075; Emesis/NG output:800; YBOFB:5102] Intake/Output this shift:    Incision/Wound:C/D/I  Ostomy retracted but functioning. Viable.    Lab Results:   Recent Labs  02/08/13 0500 02/09/13 0400  WBC 12.8* 12.6*  HGB 7.8* 8.8*  HCT 22.8* 26.2*  PLT 271 333   BMET  Recent Labs  02/08/13 0500 02/09/13 0400  NA 147 149*  K 3.1* 2.6*  CL 115* 116*  CO2 20 19  GLUCOSE 108* 92  BUN 24* 23  CREATININE 1.46* 1.36*  CALCIUM 9.0 9.3   PT/INR No results found for this basename: LABPROT, INR,  in the last 72 hours ABG No results found for this basename: PHART, PCO2, PO2, HCO3,  in the last 72 hours  Studies/Results: Dg Chest Port 1 View  02/09/2013   CLINICAL DATA:  Post colostomy  EXAM: PORTABLE CHEST - 1 VIEW  COMPARISON:  02/07/2013; 02/05/2013; 08/24/2012  FINDINGS: Grossly unchanged enlarged cardiac silhouette and mediastinal contours. Interval placement of enteric tube with tip and side port projecting below the left hemidiaphragm. Otherwise, stable positioning of remaining support apparatus. No pneumothorax. Overall improved aeration of the lungs with persistent perihilar and bilateral medial basilar opacities. Mild pulmonary venous congestion without frank evidence of edema. Trace bilateral effusions are not excluded. Grossly unchanged bones.  IMPRESSION: 1. Enteric tube tip and side port projects below the left  hemidiaphragm. Otherwise, stable position of remaining support apparatus. 2. Improved aeration of the lungs with persistent perihilar and medial basilar opacities, likely atelectasis. 3. Pulmonary venous congestion without frank evidence of edema.   Electronically Signed   By: Sandi Mariscal M.D.   On: 02/09/2013 07:46   Dg Abd Portable 1v  02/07/2013   CLINICAL DATA:  Abdominal pain, nausea and vomiting. 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  EXAM: PORTABLE ABDOMEN - 1 VIEW  COMPARISON:  CT on 02/02/2013  FINDINGS: Mildly prominent left-sided small bowel loops may be consistent with focal ileus. No colonic dilatation is identified.  IMPRESSION: Possible mild small bowel ileus.   Electronically Signed   By: Aletta Edouard M.D.   On: 02/07/2013 12:41    Anti-infectives: Anti-infectives   Start     Dose/Rate Route Frequency Ordered Stop   02/07/13 1000  vancomycin (VANCOCIN) 1,500 mg in sodium chloride 0.9 % 500 mL IVPB  Status:  Discontinued     1,500 mg 250 mL/hr over 120 Minutes Intravenous Every 24 hours 02/07/13 0659 02/07/13 0911   02/06/13 1000  vancomycin (VANCOCIN) IVPB 1000 mg/200 mL premix  Status:  Discontinued     1,000 mg 200 mL/hr over 60 Minutes Intravenous Every 24 hours 02/05/13 0757 02/07/13 0659   02/05/13 0830  vancomycin (VANCOCIN) 2,000 mg in sodium chloride 0.9 % 500 mL IVPB     2,000 mg 250 mL/hr over 120 Minutes Intravenous  Once 02/05/13 0757 02/05/13 1100   02/05/13 0815  piperacillin-tazobactam (ZOSYN) IVPB 3.375 g  Status:  Discontinued     3.375 g 12.5 mL/hr over 240 Minutes Intravenous 3 times per day 02/05/13 0757 02/08/13 1016   02/04/13 1515  cefoTEtan (CEFOTAN) 1 g in dextrose 5 % 50 mL IVPB  Status:  Discontinued     1 g 100 mL/hr over 30 Minutes Intravenous Every 12 hours 02/04/13 1511 02/05/13 0851   02/04/13 1045  cefoTEtan (CEFOTAN) 2 g in dextrose 5 % 50 mL IVPB     2 g 100 mL/hr over 30 Minutes Intravenous On call  to O.R. 02/04/13 1039 02/04/13 1030      Assessment/Plan: s/p Procedure(s): PARTIAL COLECTOMY (N/A) COLOSTOMY (Right) D/C NGT KEEP ON CLEARS FOR TODAY Ostomy retracted but viable.  Follow for now.   LOS: 7 days    Rodneisha Bonnet A. 02/09/2013

## 2013-02-09 NOTE — Progress Notes (Addendum)
Name: [name] MRN: [id number] DOB: 1950-02-06    ADMISSION DATE:  02/02/2013  REFERRING MD :  Shane Crutch PCP Walker Kehr, MD  CHIEF COMPLAINT:  Low blood pressure  BRIEF PATIENT DESCRIPTION:  63 yo female admitted with abdominal pain from colon mass.    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 Doppler parameters are consistent with abnormal left ventricular relaxation (grade 1 diastolic dysfunction). - Pulmonary arteries: PASP is bordeline elevated. PA peak pressure: 36mm Hg (S).    LINES / TUBES: Aline 1/8 >> 1/11 Lt IJ CVL 1/9 >>  CULTURES: Blood 1/9 >> Urine 1/9 >> negative  ANTIBIOTICS: Cefotetan 1/8 >> 1/9 Zosyn 1/9 >> Vancomycin 1/9 >> 1/11  SUBJECTIVE:  02/08/13: Off pressors, output from NGT noted  02/09/13: CCS feel patient doing okay. NG tube dc/ed. And being kept in clears. Patient feels ok. RN says no problems other than K  VITAL SIGNS: Temp:  [97.6 F (36.4 C)-98.8 F (37.1 C)] 97.7 F (36.5 C) (01/13 0805) Pulse Rate:  [64-108] 106 (01/13 0700) Resp:  [20-31] 31 (01/13 0700) BP: (102-126)/(50-96) 102/56 mmHg (01/13 0400) SpO2:  [93 %-100 %] 97 % (01/13 0700) Weight:  [110.2 kg (242 lb 15.2 oz)] 110.2 kg (242 lb 15.2 oz) (01/13 0500)   INTAKE / OUTPUT: Intake/Output     01/12 0701 - 01/13 0700 01/13 0701 - 01/14 0700   P.O. 670    I.V. (mL/kg) 2750 (25)    NG/GT 120    IV Piggyback 275    Total Intake(mL/kg) 3815 (34.6)    Urine (mL/kg/hr) 1075 (0.4)    Emesis/NG output 800 (0.3)    Stool 1275 (0.5)    Total Output 3150     Net +665          Urine Occurrence 1 x      HEMODYNAMICS CVP:  [9 mmHg-17 mmHg] 17 mmHg  PHYSICAL EXAMINATION: General: in chair Neuro: normal strength, follows commands HEENT: dry jvd low Cardiovascular:S1 s2 rrr Lungs:  CTA Abdomen:  Wound clean, colostomy in place Musculoskeletal:  No edema Skin:  No rashes  LABS:  CBC  Recent Labs Lab 02/07/13 0442 02/08/13 0500 02/09/13 0400  WBC 14.9* 12.8* 12.6*  HGB 8.5* 7.8* 8.8*  HCT 25.1* 22.8* 26.2*  PLT 261 271 333   Coag's  Recent Labs Lab 02/02/13 2054  INR 1.07   BMET  Recent Labs Lab 02/07/13 0442 02/08/13 0500 02/09/13 0400  NA 146 147 149*  K 3.3* 3.1* 2.6*  CL 113* 115* 116*  CO2 21 20 19   BUN 17 24* 23  CREATININE 1.63* 1.46* 1.36*  GLUCOSE 110* 108* 92   Electrolytes  Recent Labs Lab 02/03/13 0428  02/07/13 0442 02/08/13 0500 02/09/13 0400  CALCIUM 9.1  < > 8.6 9.0 9.3  MG 2.2  --   --   --   --   < > = values in this interval not displayed. Sepsis Markers  Recent Labs Lab 02/05/13 0849 02/05/13 0850 02/05/13 1533 02/07/13 0442 02/09/13 0400  LATICACIDVEN  --  0.9 1.0  --   --   PROCALCITON 5.48  --   --  3.80 1.66   Liver Enzymes  Recent Labs Lab 02/03/13 0428 02/09/13 0400  AST 17 15  ALT 18 14  ALKPHOS 111 124*  BILITOT 0.2* 0.5  ALBUMIN 2.7* 2.0*   Glucose  Recent Labs Lab 02/07/13 1545 02/07/13 1941 02/08/13 0806 02/08/13 1152 02/08/13 1721 02/08/13 1949  GLUCAP 110* 112* 100* 106* 101* 123*    Imaging Dg Chest Port 1 View  02/09/2013   CLINICAL DATA:  Post colostomy  EXAM: PORTABLE CHEST - 1 VIEW  COMPARISON:  02/07/2013; 02/05/2013; 08/24/2012  FINDINGS: Grossly unchanged enlarged cardiac silhouette and mediastinal contours. Interval placement of enteric tube with tip and side port projecting below the left hemidiaphragm. Otherwise, stable positioning of remaining support apparatus. No pneumothorax. Overall improved aeration of the lungs with persistent perihilar and bilateral medial basilar  opacities. Mild pulmonary venous congestion without frank evidence of edema. Trace bilateral effusions are not excluded. Grossly unchanged bones.  IMPRESSION: 1. Enteric tube tip and side port projects below the left hemidiaphragm. Otherwise, stable position of remaining support apparatus. 2. Improved aeration of the lungs with persistent perihilar and medial basilar opacities, likely atelectasis. 3. Pulmonary venous congestion without frank evidence of edema.   Electronically Signed   By: Sandi Mariscal M.D.   On: 02/09/2013 07:46   Dg Abd Portable 1v  02/07/2013   CLINICAL DATA:  Abdominal pain, nausea and vomiting. 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  EXAM: PORTABLE ABDOMEN - 1 VIEW  COMPARISON:  CT on 02/02/2013  FINDINGS: Mildly prominent left-sided small bowel loops may be consistent with focal ileus. No colonic dilatation is identified.  IMPRESSION: Possible mild small bowel ileus.   Electronically Signed   By: Aletta Edouard M.D.   On: 02/07/2013 12:41    ASSESSMENT / PLAN:  PULMONARY A: Hx of OSA, Asthma.  02/09/13: No overt failure on cxr. Improving atelectatiss  P:   -CPAP qhs -oxygen to keep SpO2 > 92% -brovana, pulmicort, prn albuterol -holding  advair, singular for now -aggressuve pulm toilet  CARDIOVASCULAR A:  Hypotension >> likely from hypovolemia Rel Ai present ECHO 02/07/13: Normal  02/09/13: Off pressors x > 48h P:  -continue IV fluids (half normal saline) 0- Increase fluids if more hypernatermiec depending on  NGT / stool output that can be extensives -off pressors, reduce steroids over 4-7 days and end by 02/14/13  RENAL A:   Acute kidney injury likely 2nd to hypovolemia >> improving. NON AG acidosis from saline P:   -fluids to half normal saline -avoid saline further -no role bicarb  GASTROINTESTINAL A:   Colon cancer s/p Lt hemicolectomy, end colostomy and Hartman's  pouch. Nutrition. Nausea/vomiting/dyspepsia.  02/11/13: Off NG tube and on clears P:   -post -op care, nutrition per CCS -continue protonix -prn zofran, phenergan   HEMATOLOGIC A:   Anemia of chronic disease >> likely also hemodiluational. P:  -f/u CBC as with pos balance may drop further -transfuse for Hb < 7 -SCD, lovenox for DVT prevention, crt in am   INFECTIOUS A:   No evidence infection  P:   -consider dc zosyn , appears as hypovolemia, remains culture neg on 02/10/13 -d/c vancomycin  ENDOCRINE A:   Relative adrenal insufficiency >> cortisol 16.6 from 1/10.   P:   -started solu cortef 1/10 >> reduce, see cvs  NEUROLOGIC A:   Post-op pain. P:   -prn pain meds   GLOBAL 1/13: No family at bedside. Tx to floor. [facility] pick up 02/10/13  Dr. Brand Males, M.D., [facility].C.P Pulmonary and Critical Care Medicine Staff Physician [place] Pager: [phone], If no answer or between  15:00h - 7:00h: call [phone]  02/09/2013 8:53 AM

## 2013-02-09 NOTE — Progress Notes (Signed)
Placed pt on CPAP with 2 L of O2 bled in and on auto titrate for the pressure.  Patient is tolerating CPAP just fine and vitals and sats are stable.  RT will continue to monitor.

## 2013-02-10 DIAGNOSIS — K56609 Unspecified intestinal obstruction, unspecified as to partial versus complete obstruction: Secondary | ICD-10-CM | POA: Diagnosis not present

## 2013-02-10 LAB — CBC WITH DIFFERENTIAL/PLATELET
BASOS ABS: 0 10*3/uL (ref 0.0–0.1)
Basophils Relative: 0 % (ref 0–1)
EOS ABS: 0 10*3/uL (ref 0.0–0.7)
Eosinophils Relative: 0 % (ref 0–5)
HCT: 24.6 % — ABNORMAL LOW (ref 36.0–46.0)
HEMOGLOBIN: 8.1 g/dL — AB (ref 12.0–15.0)
Lymphocytes Relative: 10 % — ABNORMAL LOW (ref 12–46)
Lymphs Abs: 1.2 10*3/uL (ref 0.7–4.0)
MCH: 21.3 pg — ABNORMAL LOW (ref 26.0–34.0)
MCHC: 32.9 g/dL (ref 30.0–36.0)
MCV: 64.7 fL — ABNORMAL LOW (ref 78.0–100.0)
Monocytes Absolute: 1.4 10*3/uL — ABNORMAL HIGH (ref 0.1–1.0)
Monocytes Relative: 12 % (ref 3–12)
NEUTROS PCT: 78 % — AB (ref 43–77)
Neutro Abs: 9.1 10*3/uL — ABNORMAL HIGH (ref 1.7–7.7)
PLATELETS: 297 10*3/uL (ref 150–400)
RBC: 3.8 MIL/uL — ABNORMAL LOW (ref 3.87–5.11)
RDW: 18.1 % — AB (ref 11.5–15.5)
WBC: 11.7 10*3/uL — ABNORMAL HIGH (ref 4.0–10.5)

## 2013-02-10 LAB — BASIC METABOLIC PANEL
BUN: 18 mg/dL (ref 6–23)
CALCIUM: 9 mg/dL (ref 8.4–10.5)
CO2: 19 mEq/L (ref 19–32)
CREATININE: 1.13 mg/dL — AB (ref 0.50–1.10)
Chloride: 109 mEq/L (ref 96–112)
GFR calc non Af Amer: 51 mL/min — ABNORMAL LOW (ref >90)
GFR, EST AFRICAN AMERICAN: 59 mL/min — AB (ref >90)
Glucose, Bld: 101 mg/dL — ABNORMAL HIGH (ref 70–99)
POTASSIUM: 3 meq/L — AB (ref 3.7–5.3)
Sodium: 141 mEq/L (ref 137–147)

## 2013-02-10 LAB — PHOSPHORUS: Phosphorus: 2.8 mg/dL (ref 2.3–4.6)

## 2013-02-10 LAB — MAGNESIUM: Magnesium: 1.9 mg/dL (ref 1.5–2.5)

## 2013-02-10 MED ORDER — POTASSIUM CHLORIDE 10 MEQ/50ML IV SOLN
10.0000 meq | INTRAVENOUS | Status: DC
  Administered 2013-02-10 (×2): 10 meq via INTRAVENOUS
  Filled 2013-02-10 (×5): qty 50

## 2013-02-10 MED ORDER — POTASSIUM CHLORIDE CRYS ER 20 MEQ PO TBCR
40.0000 meq | EXTENDED_RELEASE_TABLET | Freq: Three times a day (TID) | ORAL | Status: AC
  Administered 2013-02-10 (×3): 40 meq via ORAL
  Filled 2013-02-10 (×2): qty 2
  Filled 2013-02-10: qty 4
  Filled 2013-02-10: qty 2

## 2013-02-10 MED ORDER — HYDROCORTISONE SOD SUCCINATE 100 MG IJ SOLR
25.0000 mg | Freq: Two times a day (BID) | INTRAMUSCULAR | Status: DC
  Administered 2013-02-11 (×2): 25 mg via INTRAVENOUS
  Administered 2013-02-12: 18:00:00 via INTRAVENOUS
  Administered 2013-02-13 – 2013-02-16 (×7): 25 mg via INTRAVENOUS
  Administered 2013-02-16: 14:00:00 via INTRAVENOUS
  Administered 2013-02-17: 25 mg via INTRAVENOUS
  Filled 2013-02-10 (×16): qty 0.5

## 2013-02-10 NOTE — Progress Notes (Signed)
Placed patient on CPAP (auto titrate) with 2 L of O2 bled in.  Patient is tolerating CPAP and no complications noted. Vitals are within normal limits . RT  Will continue to monitor.

## 2013-02-10 NOTE — Progress Notes (Signed)
6 Days Post-Op  Subjective: Sleeping but arousable.   Objective: Vital signs in last 24 hours: Temp:  [97.1 F (36.2 C)-98.6 F (37 C)] 97.1 F (36.2 C) (01/14 0730) Pulse Rate:  [86] 86 (01/13 0900) Resp:  [21-32] 21 (01/14 0800) BP: (91-120)/(55-74) 98/57 mmHg (01/14 0629) SpO2:  [95 %-98 %] 96 % (01/14 0800) Weight:  [247 lb 3.2 oz (112.129 kg)] 247 lb 3.2 oz (112.129 kg) (01/14 0500) Last BM Date: 02/09/13  Intake/Output from previous day: 01/13 0701 - 01/14 0700 In: 3390 [P.O.:840; I.V.:2350; IV Piggyback:200] Out: 2375 [Urine:1750; Emesis/NG output:50; Stool:575] Intake/Output this shift:    Incision/Wound:C/D/I ostomy retracted but  Functional.   Lab Results:   Recent Labs  02/09/13 0400 02/10/13 0400  WBC 12.6* 11.7*  HGB 8.8* 8.1*  HCT 26.2* 24.6*  PLT 333 297   BMET  Recent Labs  02/09/13 0400 02/10/13 0400  NA 149* 141  K 2.6* 3.0*  CL 116* 109  CO2 19 19  GLUCOSE 92 101*  BUN 23 18  CREATININE 1.36* 1.13*  CALCIUM 9.3 9.0   PT/INR No results found for this basename: LABPROT, INR,  in the last 72 hours ABG No results found for this basename: PHART, PCO2, PO2, HCO3,  in the last 72 hours  Studies/Results: Dg Chest Port 1 View  02/09/2013   CLINICAL DATA:  Post colostomy  EXAM: PORTABLE CHEST - 1 VIEW  COMPARISON:  02/07/2013; 02/05/2013; 08/24/2012  FINDINGS: Grossly unchanged enlarged cardiac silhouette and mediastinal contours. Interval placement of enteric tube with tip and side port projecting below the left hemidiaphragm. Otherwise, stable positioning of remaining support apparatus. No pneumothorax. Overall improved aeration of the lungs with persistent perihilar and bilateral medial basilar opacities. Mild pulmonary venous congestion without frank evidence of edema. Trace bilateral effusions are not excluded. Grossly unchanged bones.  IMPRESSION: 1. Enteric tube tip and side port projects below the left hemidiaphragm. Otherwise, stable  position of remaining support apparatus. 2. Improved aeration of the lungs with persistent perihilar and medial basilar opacities, likely atelectasis. 3. Pulmonary venous congestion without frank evidence of edema.   Electronically Signed   By: Sandi Mariscal M.D.   On: 02/09/2013 07:46    Anti-infectives: Anti-infectives   Start     Dose/Rate Route Frequency Ordered Stop   02/07/13 1000  vancomycin (VANCOCIN) 1,500 mg in sodium chloride 0.9 % 500 mL IVPB  Status:  Discontinued     1,500 mg 250 mL/hr over 120 Minutes Intravenous Every 24 hours 02/07/13 0659 02/07/13 0911   02/06/13 1000  vancomycin (VANCOCIN) IVPB 1000 mg/200 mL premix  Status:  Discontinued     1,000 mg 200 mL/hr over 60 Minutes Intravenous Every 24 hours 02/05/13 0757 02/07/13 0659   02/05/13 0830  vancomycin (VANCOCIN) 2,000 mg in sodium chloride 0.9 % 500 mL IVPB     2,000 mg 250 mL/hr over 120 Minutes Intravenous  Once 02/05/13 0757 02/05/13 1100   02/05/13 0815  piperacillin-tazobactam (ZOSYN) IVPB 3.375 g  Status:  Discontinued     3.375 g 12.5 mL/hr over 240 Minutes Intravenous 3 times per day 02/05/13 0757 02/08/13 1016   02/04/13 1515  cefoTEtan (CEFOTAN) 1 g in dextrose 5 % 50 mL IVPB  Status:  Discontinued     1 g 100 mL/hr over 30 Minutes Intravenous Every 12 hours 02/04/13 1511 02/05/13 0851   02/04/13 1045  cefoTEtan (CEFOTAN) 2 g in dextrose 5 % 50 mL IVPB     2 g 100  mL/hr over 30 Minutes Intravenous On call to O.R. 02/04/13 1039 02/04/13 1030      Assessment/Plan: s/p Procedure(s): PARTIAL COLECTOMY (N/A) COLOSTOMY (Right) Replace electrolytes.  Ostomy retracted but functional.   Needs PT/OT  LOS: 8 days    [name] A. 02/10/2013

## 2013-02-10 NOTE — Progress Notes (Signed)
Patient in sinus brady rhythm with heart rate in the mid 40s and BP 98/57 with MAP of 66.  Warren Lacy MD notified. No further orders provided. Will continue to monitor patient carefully.

## 2013-02-10 NOTE — Plan of Care (Signed)
Persistent hypokalemia- note pt on clears so will dc IV KCL and instead will give PO KCL 40 meq TID x 3 doses.  Erin Hearing, ANP  I have personally examined this patient and reviewed the entire database. I have reviewed the above note, made any necessary editorial changes, and agree with its content.  Cherene Altes, MD [facility]

## 2013-02-10 NOTE — Progress Notes (Signed)
Progress Note [place] TEAM 1 - Stepdown/ICU TEAM   MAVEL ARRINGTON [id number] DOB: 12/08/1950 DOA: 02/02/2013 PCP: Walker Kehr, MD  Admit HPI / Brief Narrative: 63 yo female admitted with abdominal pain, wgt loss, N/V found to have a partially obstructing colon mass.   SIGNIFICANT EVENTS:  1/6 Admit by [facility] - GI/CCS consulted  1/8 Left hemicolectomy with mobilization of the splenic flexure, creation of end colostomy and Hartman's pouch  1/9 Hypotension, PCCM consulted, transfer to ICU  1/10 Off pressors  1/11- Nausea relived by NGT placement 1/11 TTE - EF 60% to 65% - grade 1 diastolic dysfunction - PASP bordeline elevated. PA peak pressure: 82mm Hg 1/14 - To [facility] Service   HPI/Subjective: The pt has no complaints.  She is getting up to the bathroom w/o difficulty.  She denies cp, sob, n/v, or signif abdom pain.   Assessment/Plan:  Colon cancer s/p Lt hemicolectomy, end colostomy and Hartman's pouch Ongoing care as per Gen Surgery   Hypokalemia Mg 1.9 - cont to replace - goal is K of 4.0  Acute kidney injury  likely 2nd to hypovolemia > improving steadily   Anemia of chronic disease  Recheck Hgb in AM - transfuse as need for Hgb <7.0  Relative adrenal insufficiency cortisol 16.6 1/10 - slow steroid taper ongoing   Hx of OSA Does not appear to use home CPAP - outpt study once stabilized would be indicated   NON AG acidosis  from saline - resolved   Chronic back pain  on disability   Obesity - Body mass index is 45.2 kg/(m^2).  Code Status: FULL Family Communication: no family present at time of exam Disposition Plan: transfer to med bed  Consultants: GI Gen Surgery TRH >> PCCM >> TRH  Antibiotics: Cefotetan 1/8 >> 1/9  Zosyn 1/9 >>  Vancomycin 1/9 >> 1/11  DVT prophylaxis: lovenox  Objective: Blood pressure 94/74, pulse 86, temperature 97.1 F (36.2 C), temperature source Axillary, resp. rate 19, height 5\' 2"  (1.575 m), weight 112.129 kg  (247 lb 3.2 oz), SpO2 96.00%.  Intake/Output Summary (Last 24 hours) at 02/10/13 1119 Last data filed at 02/10/13 0900  Gross per 24 hour  Intake   2900 ml  Output   2375 ml  Net    525 ml     Exam: General: No acute respiratory distress Lungs: Clear to auscultation bilaterally without wheezes or crackles Cardiovascular: Regular rate and rhythm without murmur gallop or rub normal S1 and S2 Abdomen: Nondistended, soft, bowel sounds hypoactive, no rebound, no ascites, no appreciable mass Extremities: No significant cyanosis, clubbing, or edema bilateral lower extremities  Data Reviewed: Basic Metabolic Panel:  Recent Labs Lab 02/06/13 0515 02/07/13 0442 02/08/13 0500 02/09/13 0400 02/10/13 0400  NA 142 146 147 149* 141  K 3.8 3.3* 3.1* 2.6* 3.0*  CL 111 113* 115* 116* 109  CO2 19 21 20 19 19   GLUCOSE 98 110* 108* 92 101*  BUN 17 17 24* 23 18  CREATININE 1.89* 1.63* 1.46* 1.36* 1.13*  CALCIUM 7.8* 8.6 9.0 9.3 9.0  MG  --   --   --   --  1.9  PHOS  --   --   --   --  2.8   Liver Function Tests:  Recent Labs Lab 02/09/13 0400  AST 15  ALT 14  ALKPHOS 124*  BILITOT 0.5  PROT 6.1  ALBUMIN 2.0*   CBC:  Recent Labs Lab 02/06/13 0515 02/06/13 1636 02/07/13 0442 02/08/13 0500  02/09/13 0400 02/10/13 0400  WBC 21.6*  --  14.9* 12.8* 12.6* 11.7*  NEUTROABS  --   --   --   --  9.2* 9.1*  HGB 8.7* 9.3* 8.5* 7.8* 8.8* 8.1*  HCT 26.9* 28.6* 25.1* 22.8* 26.2* 24.6*  MCV 65.8*  --  65.0* 64.4* 63.9* 64.7*  PLT 297  --  261 271 333 297   CBG:  Recent Labs Lab 02/07/13 1941 02/08/13 0806 02/08/13 1152 02/08/13 1721 02/08/13 1949  GLUCAP 112* 100* 106* 101* 123*    Recent Results (from the past 240 hour(s))  SURGICAL PCR SCREEN     Status: None   Collection Time    02/04/13  4:39 AM      Result Value Range Status   MRSA, PCR NEGATIVE  NEGATIVE Final   Staphylococcus aureus NEGATIVE  NEGATIVE Final   Comment:            The Xpert SA Assay (FDA      approved for NASAL specimens     in patients over 20 years of age),     is one component of     a comprehensive surveillance     program.  Test performance has     been validated by [employer] for patients greater     than or equal to 74 year old.     It is not intended     to diagnose infection nor to     guide or monitor treatment.  URINE CULTURE     Status: None   Collection Time    02/05/13  8:41 AM      Result Value Range Status   Specimen Description URINE, CATHETERIZED   Final   Special Requests NONE   Final   Culture  Setup Time     Final   Value: 02/05/2013 09:14     Performed at [place]     Final   Value: NO GROWTH     Performed at [facility]   Culture     Final   Value: NO GROWTH     Performed at [facility]   Report Status 02/06/2013 FINAL   Final  CULTURE, BLOOD (ROUTINE X 2)     Status: None   Collection Time    02/05/13  9:00 AM      Result Value Range Status   Specimen Description BLOOD LEFT ANTECUBITAL   Final   Special Requests BOTTLES DRAWN AEROBIC ONLY 5CCS   Final   Culture  Setup Time     Final   Value: 02/05/2013 13:15     Performed at [facility]   Culture     Final   Value:        BLOOD CULTURE RECEIVED NO GROWTH TO DATE CULTURE WILL BE HELD FOR 5 DAYS BEFORE ISSUING A FINAL NEGATIVE REPORT     Performed at [facility]   Report Status PENDING   Incomplete  CULTURE, BLOOD (ROUTINE X 2)     Status: None   Collection Time    02/05/13  9:10 AM      Result Value Range Status   Specimen Description BLOOD LEFT HAND   Final   Special Requests BOTTLES DRAWN AEROBIC ONLY 5CCS   Final   Culture  Setup Time     Final   Value: 02/05/2013 13:14     Performed at [facility]  Culture     Final   Value:        BLOOD CULTURE RECEIVED NO GROWTH TO DATE CULTURE WILL BE HELD FOR 5 DAYS BEFORE ISSUING A FINAL NEGATIVE REPORT     Performed at [facility]   Report Status  PENDING   Incomplete  CLOSTRIDIUM DIFFICILE BY PCR     Status: None   Collection Time    02/07/13 10:55 PM      Result Value Range Status   C difficile by pcr NEGATIVE  NEGATIVE Final     Studies:  Recent x-ray studies have been reviewed in detail by the Attending Physician  Scheduled Meds:  Scheduled Meds: . antiseptic oral rinse  15 mL Mouth Rinse q12n4p  . arformoterol  15 mcg Nebulization BID  . budesonide (PULMICORT) nebulizer solution  0.5 mg Nebulization BID  . enoxaparin (LOVENOX) injection  40 mg Subcutaneous Q24H  . feeding supplement (RESOURCE BREEZE)  1 Container Oral TID BM  . hydrocortisone sod succinate (SOLU-CORTEF) inj  25 mg Intravenous Q8H  . pantoprazole (PROTONIX) IV  40 mg Intravenous Q24H  . pneumococcal 23 valent vaccine  0.5 mL Intramuscular Tomorrow-1000  . potassium chloride  40 mEq Oral TID  . sodium chloride  500 mL Intravenous Once    Time spent on care of this patient: 35 mins   [place]  [phone] Pager - Text Page per Shea Evans as per below:  On-Call/Text Page:      [email or web address]      password [email or web address]  If 7PM-7AM, please contact night-coverage [email or web address] Password [email or web address] 02/10/2013, 11:19 AM   LOS: 8 days

## 2013-02-10 NOTE — Progress Notes (Signed)
[place] Progress Note Patient Name: [name] DOB: 12-26-1950 MRN: [id number]  Date of Service  02/10/2013   HPI/Events of Note     eICU Interventions  K replaced   Intervention Category Intermediate Interventions: Electrolyte abnormality - evaluation and management  Pat Sires S. 02/10/2013, 5:56 AM

## 2013-02-11 DIAGNOSIS — J45909 Unspecified asthma, uncomplicated: Secondary | ICD-10-CM | POA: Diagnosis not present

## 2013-02-11 DIAGNOSIS — K56609 Unspecified intestinal obstruction, unspecified as to partial versus complete obstruction: Secondary | ICD-10-CM | POA: Diagnosis not present

## 2013-02-11 DIAGNOSIS — N179 Acute kidney failure, unspecified: Secondary | ICD-10-CM | POA: Diagnosis not present

## 2013-02-11 LAB — BASIC METABOLIC PANEL
BUN: 16 mg/dL (ref 6–23)
CALCIUM: 9 mg/dL (ref 8.4–10.5)
CO2: 18 meq/L — AB (ref 19–32)
CREATININE: 1.01 mg/dL (ref 0.50–1.10)
Chloride: 114 mEq/L — ABNORMAL HIGH (ref 96–112)
GFR calc Af Amer: 67 mL/min — ABNORMAL LOW (ref >90)
GFR calc non Af Amer: 58 mL/min — ABNORMAL LOW (ref >90)
Glucose, Bld: 98 mg/dL (ref 70–99)
Potassium: 3.7 mEq/L (ref 3.7–5.3)
Sodium: 145 mEq/L (ref 137–147)

## 2013-02-11 LAB — CULTURE, BLOOD (ROUTINE X 2)
Culture: NO GROWTH
Culture: NO GROWTH

## 2013-02-11 LAB — CBC
HCT: 26.1 % — ABNORMAL LOW (ref 36.0–46.0)
Hemoglobin: 8.5 g/dL — ABNORMAL LOW (ref 12.0–15.0)
MCH: 21.3 pg — ABNORMAL LOW (ref 26.0–34.0)
MCHC: 32.6 g/dL (ref 30.0–36.0)
MCV: 65.4 fL — AB (ref 78.0–100.0)
PLATELETS: 295 10*3/uL (ref 150–400)
RBC: 3.99 MIL/uL (ref 3.87–5.11)
RDW: 18 % — AB (ref 11.5–15.5)
WBC: 12.5 10*3/uL — AB (ref 4.0–10.5)

## 2013-02-11 MED ORDER — MONTELUKAST SODIUM 10 MG PO TABS
10.0000 mg | ORAL_TABLET | Freq: Every day | ORAL | Status: DC
  Administered 2013-02-11 – 2013-02-16 (×6): 10 mg via ORAL
  Filled 2013-02-11 (×13): qty 1

## 2013-02-11 MED ORDER — BOOST / RESOURCE BREEZE PO LIQD
1.0000 | Freq: Two times a day (BID) | ORAL | Status: DC
  Administered 2013-02-12 – 2013-02-17 (×9): 1 via ORAL

## 2013-02-11 NOTE — Progress Notes (Addendum)
Clinical Social Work Department CLINICAL SOCIAL WORK PLACEMENT NOTE 02/11/2013  Patient:  [name], [name]  Account Number:  [id number] Admit date:  02/02/2013  Clinical Social Worker:  Megan Salon  Date/time:  02/11/2013 03:18 PM  Clinical Social Work is seeking post-discharge placement for this patient at the following level of care:   [place]   (*CSW will update this form in Epic as items are completed)   02/11/2013  Patient/family provided with [place] Department of Clinical Social Work's list of facilities offering this level of care within the geographic area requested by the patient (or if unable, by the patient's family).  02/11/2013  Patient/family informed of their freedom to choose among providers that offer the needed level of care, that participate in Medicare, Medicaid or managed care program needed by the patient, have an available bed and are willing to accept the patient.  02/11/2013  Patient/family informed of [facility]' ownership interest in [facility], as well as of the fact that they are under no obligation to receive care at this facility.  PASARR submitted to EDS on 02/11/2013 PASARR number received from EDS on   FL2 transmitted to all facilities in geographic area requested by pt/family on  02/11/2013 FL2 transmitted to all facilities within larger geographic area on   Patient informed that his/her managed care company has contracts with or will negotiate with  certain facilities, including the following:     Patient/family informed of bed offers received:   Patient chooses bed at  Physician recommends and patient chooses bed at    Patient to be transferred to  on   Patient to be transferred to facility by   The following physician request were entered in Epic:   Additional Comments:  Jeanette Caprice, MSW, [place]

## 2013-02-11 NOTE — Progress Notes (Signed)
7 Days Post-Op  Subjective: PT WITHOUT COMPLAINTS.   Objective: Vital signs in last 24 hours: Temp:  [97.8 F (36.6 C)-98.5 F (36.9 C)] 98.5 F (36.9 C) (01/15 0733) Pulse Rate:  [50-71] 71 (01/15 0700) Resp:  [18-34] 25 (01/15 0700) BP: (105-112)/(52-62) 105/52 mmHg (01/15 0400) SpO2:  [95 %-98 %] 97 % (01/15 0700) Weight:  [246 lb 14.6 oz (112 kg)] 246 lb 14.6 oz (112 kg) (01/15 0500) Last BM Date: 02/10/13  Intake/Output from previous day: 01/14 0701 - 01/15 0700 In: 5400 [P.O.:755; I.V.:520] Out: 3200 [Urine:2000; Stool:1200] Intake/Output this shift:    Incision/Wound:C/D/I OSTOMY WITH RETRACTION BUT VIABLE AND FUNCTIONING.   Lab Results:   Recent Labs  02/10/13 0400 02/11/13 0316  WBC 11.7* 12.5*  HGB 8.1* 8.5*  HCT 24.6* 26.1*  PLT 297 295   BMET  Recent Labs  02/10/13 0400 02/11/13 0316  NA 141 145  K 3.0* 3.7  CL 109 114*  CO2 19 18*  GLUCOSE 101* 98  BUN 18 16  CREATININE 1.13* 1.01  CALCIUM 9.0 9.0   PT/INR No results found for this basename: LABPROT, INR,  in the last 72 hours ABG No results found for this basename: PHART, PCO2, PO2, HCO3,  in the last 72 hours  Studies/Results: No results found.  Anti-infectives: Anti-infectives   Start     Dose/Rate Route Frequency Ordered Stop   02/07/13 1000  vancomycin (VANCOCIN) 1,500 mg in sodium chloride 0.9 % 500 mL IVPB  Status:  Discontinued     1,500 mg 250 mL/hr over 120 Minutes Intravenous Every 24 hours 02/07/13 0659 02/07/13 0911   02/06/13 1000  vancomycin (VANCOCIN) IVPB 1000 mg/200 mL premix  Status:  Discontinued     1,000 mg 200 mL/hr over 60 Minutes Intravenous Every 24 hours 02/05/13 0757 02/07/13 0659   02/05/13 0830  vancomycin (VANCOCIN) 2,000 mg in sodium chloride 0.9 % 500 mL IVPB     2,000 mg 250 mL/hr over 120 Minutes Intravenous  Once 02/05/13 0757 02/05/13 1100   02/05/13 0815  piperacillin-tazobactam (ZOSYN) IVPB 3.375 g  Status:  Discontinued     3.375 g 12.5  mL/hr over 240 Minutes Intravenous 3 times per day 02/05/13 0757 02/08/13 1016   02/04/13 1515  cefoTEtan (CEFOTAN) 1 g in dextrose 5 % 50 mL IVPB  Status:  Discontinued     1 g 100 mL/hr over 30 Minutes Intravenous Every 12 hours 02/04/13 1511 02/05/13 0851   02/04/13 1045  cefoTEtan (CEFOTAN) 2 g in dextrose 5 % 50 mL IVPB     2 g 100 mL/hr over 30 Minutes Intravenous On call to O.R. 02/04/13 1039 02/04/13 1030      Assessment/Plan: s/p Procedure(s): PARTIAL COLECTOMY (N/A) COLOSTOMY (Right) TO FLOOR WHEN BED AVAILABLE.   SURGICALLY STABLE.   STAPLES OUT Monday.   LOS: 9 days    Jaceon Heiberger A. 02/11/2013
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
## 2013-02-11 NOTE — Consult Note (Signed)
WOC ostomy follow up Stoma type/location: RLQ ascending colostomy.  Oval, red, retracted and functioning.  62mls in pouch.  Pouch leaking at 9 o'clock.  Pouch change due today.  No family in room; patient OOB in chair and talking of plan to move her from ICU. Stomal assessment/size: 1 inch x 1 and 1/2 inch oval Peristomal assessment: intact with early erythema at 9 o'clock where leakage was beginning to occur. Treatment options for stomal/peristomal skin:  Output: 50 ml in old ostomy pouch.  Ostomy pouching: 1pc.convex pouch Kellie Simmering [phone]) with skin barrier ring Kellie Simmering [phone]) Education provided: Patient not able to view stoma while sitting.  Discussed that our goal was to find the pouching system that will last so that she can perform changes on a planned schedule. Patient pleasant and conversive, asking appropriate questions such as if there would be modifications to her diet at home.  Encourage to eat food as desired, but to select those with protein for tissue repair. Enrolled patient in [place] Start Discharge program: No WOC nursing team will follow, and will remain available to this patient, the nursing, surgical and medical teams.  Please re-consult if needed between visits. Thanks, Maudie Flakes, MSN, RN, [place], [place], [place] [phone])

## 2013-02-11 NOTE — Progress Notes (Signed)
Progress Note [place] TEAM 1 - Stepdown/ICU TEAM   [name] [id number] DOB: 1950-05-09 DOA: 02/02/2013 PCP: Walker Kehr, MD  Admit HPI / Brief Narrative: 63 yo female admitted with abdominal pain, wgt loss, N/V found to have a partially obstructing colon mass.   SIGNIFICANT EVENTS:  1/6 Admit by [facility] - GI/CCS consulted  1/8 Left hemicolectomy with mobilization of the splenic flexure, creation of end colostomy and Hartman's pouch  1/9 Hypotension, PCCM consulted, transfer to ICU  1/10 Off pressors  1/11- Nausea relived by NGT placement 1/11 TTE - EF 60% to 65% - grade 1 diastolic dysfunction - PASP bordeline elevated. PA peak pressure: 82mm Hg 1/14 - To TRH Service   HPI/Subjective: The pt has no complaints.  She is getting up to the bathroom w/o difficulty.  She denies cp, sob, n/v, or signif abdom pain.   Assessment/Plan:  Colon cancer s/p Lt hemicolectomy, end colostomy and Hartman's pouch Ongoing care as per Gen Surgery   Hypokalemia Mg 1.9 - cont to replace - goal is K of 4.0  Acute kidney injury  likely 2nd to hypovolemia > improving steadily   Anemia of chronic disease  Recheck Hgb in AM - transfuse as need for Hgb <7.0  Relative adrenal insufficiency cortisol 16.6 1/10 - slow steroid taper ongoing   Hx of OSA Does not appear to use home CPAP - outpt study once stabilized would be indicated   NON AG acidosis  from saline - resolved   Chronic back pain  on disability   Obesity - Body mass index is 45.15 kg/(m^2).  Code Status: FULL Family Communication: no family present at time of exam Disposition Plan: transfer to med bed- SNF eventually - social work consult  Consultants: GI Gen Surgery TRH >> PCCM >> TRH  Antibiotics: Cefotetan 1/8 >> 1/9  Zosyn 1/9 >>  Vancomycin 1/9 >> 1/11  DVT prophylaxis: lovenox  Objective: Blood pressure 110/61, pulse 66, temperature 98.3 F (36.8 C), temperature source Oral, resp. rate 28, height  5\' 2"  (1.575 m), weight 112 kg (246 lb 14.6 oz), SpO2 99.00%.  Intake/Output Summary (Last 24 hours) at 02/11/13 1359 Last data filed at 02/11/13 1000  Gross per 24 hour  Intake    720 ml  Output   2750 ml  Net  -2030 ml     Exam: General: No acute respiratory distress Lungs: Clear to auscultation bilaterally without wheezes or crackles Cardiovascular: Regular rate and rhythm without murmur gallop or rub normal S1 and S2 Abdomen: Nondistended, soft, bowel sounds hypoactive, no rebound, no ascites, no appreciable mass Extremities: No significant cyanosis, clubbing, or edema bilateral lower extremities  Data Reviewed: Basic Metabolic Panel:  Recent Labs Lab 02/07/13 0442 02/08/13 0500 02/09/13 0400 02/10/13 0400 02/11/13 0316  NA 146 147 149* 141 145  K 3.3* 3.1* 2.6* 3.0* 3.7  CL 113* 115* 116* 109 114*  CO2 21 20 19 19  18*  GLUCOSE 110* 108* 92 101* 98  BUN 17 24* 23 18 16   CREATININE 1.63* 1.46* 1.36* 1.13* 1.01  CALCIUM 8.6 9.0 9.3 9.0 9.0  MG  --   --   --  1.9  --   PHOS  --   --   --  2.8  --    Liver Function Tests:  Recent Labs Lab 02/09/13 0400  AST 15  ALT 14  ALKPHOS 124*  BILITOT 0.5  PROT 6.1  ALBUMIN 2.0*   CBC:  Recent Labs Lab 02/07/13 0442 02/08/13  0500 02/09/13 0400 02/10/13 0400 02/11/13 0316  WBC 14.9* 12.8* 12.6* 11.7* 12.5*  NEUTROABS  --   --  9.2* 9.1*  --   HGB 8.5* 7.8* 8.8* 8.1* 8.5*  HCT 25.1* 22.8* 26.2* 24.6* 26.1*  MCV 65.0* 64.4* 63.9* 64.7* 65.4*  PLT 261 271 333 297 295   CBG:  Recent Labs Lab 02/07/13 1941 02/08/13 0806 02/08/13 1152 02/08/13 1721 02/08/13 1949  GLUCAP 112* 100* 106* 101* 123*    Recent Results (from the past 240 hour(s))  SURGICAL PCR SCREEN     Status: None   Collection Time    02/04/13  4:39 AM      Result Value Range Status   MRSA, PCR NEGATIVE  NEGATIVE Final   Staphylococcus aureus NEGATIVE  NEGATIVE Final   Comment:            The Xpert SA Assay (FDA     approved for NASAL  specimens     in patients over 54 years of age),     is one component of     a comprehensive surveillance     program.  Test performance has     been validated by [employer] for patients greater     than or equal to 76 year old.     It is not intended     to diagnose infection nor to     guide or monitor treatment.  URINE CULTURE     Status: None   Collection Time    02/05/13  8:41 AM      Result Value Range Status   Specimen Description URINE, CATHETERIZED   Final   Special Requests NONE   Final   Culture  Setup Time     Final   Value: 02/05/2013 09:14     Performed at [place]     Final   Value: NO GROWTH     Performed at [facility]   Culture     Final   Value: NO GROWTH     Performed at [facility]   Report Status 02/06/2013 FINAL   Final  CULTURE, BLOOD (ROUTINE X 2)     Status: None   Collection Time    02/05/13  9:00 AM      Result Value Range Status   Specimen Description BLOOD LEFT ANTECUBITAL   Final   Special Requests BOTTLES DRAWN AEROBIC ONLY 5CCS   Final   Culture  Setup Time     Final   Value: 02/05/2013 13:15     Performed at [facility]   Culture     Final   Value: NO GROWTH 5 DAYS     Performed at [facility]   Report Status 02/11/2013 FINAL   Final  CULTURE, BLOOD (ROUTINE X 2)     Status: None   Collection Time    02/05/13  9:10 AM      Result Value Range Status   Specimen Description BLOOD LEFT HAND   Final   Special Requests BOTTLES DRAWN AEROBIC ONLY 5CCS   Final   Culture  Setup Time     Final   Value: 02/05/2013 13:14     Performed at [facility]   Culture     Final   Value: NO GROWTH 5 DAYS     Performed at [facility]   Report Status 02/11/2013 FINAL   Final  CLOSTRIDIUM DIFFICILE BY PCR     Status: None   Collection Time    02/07/13 10:55 PM      Result Value Range Status   C difficile by pcr NEGATIVE  NEGATIVE Final     Studies:  Recent x-ray  studies have been reviewed in detail by the Attending Physician  Scheduled Meds:  Scheduled Meds: . antiseptic oral rinse  15 mL Mouth Rinse q12n4p  . arformoterol  15 mcg Nebulization BID  . budesonide (PULMICORT) nebulizer solution  0.5 mg Nebulization BID  . enoxaparin (LOVENOX) injection  40 mg Subcutaneous Q24H  . feeding supplement (RESOURCE BREEZE)  1 Container Oral BID BM  . hydrocortisone sod succinate (SOLU-CORTEF) inj  25 mg Intravenous Q12H  . montelukast  10 mg Oral QHS  . pantoprazole (PROTONIX) IV  40 mg Intravenous Q24H  . pneumococcal 23 valent vaccine  0.5 mL Intramuscular Tomorrow-1000    Time spent on care of this patient: 34 mins   Debbe Odea, MD  [facility] Office  [phone] Pager - Text Page per Shea Evans as per below:  On-Call/Text Page:      [email or web address]      password [email or web address]  If 7PM-7AM, please contact night-coverage [email or web address] Password [email or web address] 02/11/2013, 1:59 PM   LOS: 9 days

## 2013-02-11 NOTE — Progress Notes (Signed)
Patient not ready for CPAP at this time. Stated they would be ready at 2130 or 2200. Explained that I or someone from RT would return as close that time as possible and if she changed her mind to just have the RN call us.

## 2013-02-11 NOTE — Evaluation (Signed)
Physical Therapy Evaluation Patient Details Name: AIJAH LATTNER MRN: [id number] DOB: 1950-11-16 Today's Date: 02/11/2013 Time: 1157-2620 PT Time Calculation (min): 35 min  PT Assessment / Plan / Recommendation History of Present Illness  This is a 63 year old female who presented with progressively worsening left lower quadrant abdominal pain as well as abdominal distention. She has lost weight recently. A CT scan showed a large mass at the splenic flexure of her colon with proximal obstruction. She was unable to undergo a bowel prep. She had a flexible sigmoidoscopy which showed an obstructing mass at her splenic flexure. She presents now for surgical resection.  Clinical Impression  Pt admitted with lower quadrant abdominal pain found to be a colonic mass, s/p hemicolectomy. Pt currently with functional limitations due to the deficits listed below (see PT Problem List).  Pt will benefit from skilled PT to increase their independence and safety with mobility to allow discharge to the venue listed below.       PT Assessment  Patient needs continued PT services    Follow Up Recommendations  SNF    Does the patient have the potential to tolerate intense rehabilitation      Barriers to Discharge  (expect stairs to be a barrier)      Equipment Recommendations   (TBA at next venue)    Recommendations for Other Services     Frequency Min 3X/week    Precautions / Restrictions Restrictions Weight Bearing Restrictions: No   Pertinent Vitals/Pain sats on RA during gait 95-97%,  EHR b/w mid 100's during standing rests to lower 120's with max of 127bpm.       Mobility  Bed Mobility Overal bed mobility: Needs Assistance Bed Mobility: Sit to Supine Sit to supine: Min assist General bed mobility comments: supine via L elbow and good bridging to position herself Transfers Overall transfer level: Needs assistance Transfers: Sit to/from Stand Sit to Stand: Min assist General  transfer comment: extraordinarily slow with lifting/stability assist given Ambulation/Gait Ambulation/Gait assistance: Min guard Ambulation Distance (Feet): 300 Feet (with 5 standing rests over 30 minutes) Assistive device: Rolling walker (2 wheeled) Gait Pattern/deviations: Step-through pattern;Decreased step length - right;Decreased step length - left;Decreased stride length;Wide base of support Gait velocity: very slow if self monitored, but can be coerced to go a little faster Gait velocity interpretation: Below normal speed for age/gender    Exercises     PT Diagnosis: Other (comment);Generalized weakness (decr activity tolerance)  PT Problem List: Decreased strength;Decreased activity tolerance;Decreased mobility;Decreased knowledge of use of DME;Decreased safety awareness;Cardiopulmonary status limiting activity;Pain PT Treatment Interventions: Gait training;DME instruction;Stair training;Functional mobility training;Therapeutic activities;Patient/family education     PT Goals(Current goals can be found in the care plan section) Acute Rehab PT Goals Patient Stated Goal: be able to do for myself, get out to run errands PT Goal Formulation: With patient Time For Goal Achievement: 02/25/13 Potential to Achieve Goals: Good  Visit Information  Last PT Received On: 02/11/13 Assistance Needed: +1 History of Present Illness: This is a 63 year old female who presented with progressively worsening left lower quadrant abdominal pain as well as abdominal distention. She has lost weight recently. A CT scan showed a large mass at the splenic flexure of her colon with proximal obstruction. She was unable to undergo a bowel prep. She had a flexible sigmoidoscopy which showed an obstructing mass at her splenic flexure. She presents now for surgical resection.       Prior Functioning  Home Living Family/patient expects to be discharged  to:: Private residence Living Arrangements:  (husband and  son) Available Help at Discharge: Family;Available 24 hours/day Type of Home: House Home Access: Stairs to enter [employer] of Steps: 6 Entrance Stairs-Rails: Right Home Layout: One level Home Equipment: Walker - 2 wheels;Cane - single point Prior Function Level of Independence: Independent;Independent with assistive device(s) Comments: used a cane at times Communication Communication: No difficulties    Cognition  Cognition Arousal/Alertness: Awake/alert Behavior During Therapy: WFL for tasks assessed/performed Overall Cognitive Status: Within Functional Limits for tasks assessed    Extremity/Trunk Assessment Upper Extremity Assessment Upper Extremity Assessment: Generalized weakness Lower Extremity Assessment Lower Extremity Assessment: Generalized weakness Cervical / Trunk Assessment Cervical / Trunk Assessment: Normal   Balance Balance Overall balance assessment: No apparent balance deficits (not formally assessed)  End of Session PT - End of Session Activity Tolerance: Patient tolerated treatment well Patient left: in bed;with call bell/phone within reach Nurse Communication: Mobility status  GP     Donnita Farina, Tessie Fass 02/11/2013, 11:01 AM 02/11/2013  Donnella Sham, Candelaria Arenas [phone]  (pager)

## 2013-02-11 NOTE — Progress Notes (Signed)
Arrived to 6n03 in wc, a/ox4,ambulated to BR, oriented to room and surroundings. Denies nausea/pain at this time. Will continue to monitor and follow POC.

## 2013-02-11 NOTE — Progress Notes (Signed)
Clinical Social Work Department BRIEF PSYCHOSOCIAL ASSESSMENT 02/11/2013  Patient:  MONCHEL, POLLITT     Account Number:  [id number]     Admit date:  02/02/2013  Clinical Social Worker:  Megan Salon  Date/Time:  02/11/2013 03:07 PM  Referred by:  Physician  Date Referred:  02/11/2013 Referred for  SNF Placement   Other Referral:   Interview type:  Patient Other interview type:    PSYCHOSOCIAL DATA Living Status:  HUSBAND Admitted from facility:   Level of care:   Primary support name:  Johann Gascoigne Primary support relationship to patient:  CHILD, ADULT Degree of support available:   Good    CURRENT CONCERNS Current Concerns  Post-Acute Placement   Other Concerns:    SOCIAL WORK ASSESSMENT / PLAN Clinical Social Worker received referral for SNF placement at d/c. CSW introduced self and explained reason for visit. CSW explained SNF process to patient. Patient reported she is agreeable for SNF placement and has been to [facility] before and liked the care there and would prefer to go there. Patient states she lives with her husband and has a supportive family. CSW will complete FL2 for MD's signature and will update patient when bed offers are received.   Assessment/plan status:  Psychosocial Support/Ongoing Assessment of Needs Other assessment/ plan:   Information/referral to community resources:   snf information    PATIENT'S/FAMILY'S RESPONSE TO PLAN OF CARE: Patient was very pleasant and was agreeable to SNF placement. Patient looking forward to rehab so she can return back home eventually.        Jeanette Caprice, MSW, [place]

## 2013-02-11 NOTE — Progress Notes (Signed)
Report called to Illene Silver- RN. Pt to transfer to 6N-04 via wheelchair, belongings at bedside. Meds in chart, Family notified of new room #. VS stable at time of transfer. No current questions or complaints at this time.  [name] L

## 2013-02-11 NOTE — Progress Notes (Signed)
Patient transferred to 6N04, CSW reported off to the social worker on that unit who will follow patient. This Education officer, museum signing off.  Jeanette Caprice, MSW, [place]

## 2013-02-12 DIAGNOSIS — D509 Iron deficiency anemia, unspecified: Secondary | ICD-10-CM | POA: Diagnosis not present

## 2013-02-12 DIAGNOSIS — K56609 Unspecified intestinal obstruction, unspecified as to partial versus complete obstruction: Secondary | ICD-10-CM | POA: Diagnosis not present

## 2013-02-12 DIAGNOSIS — R109 Unspecified abdominal pain: Secondary | ICD-10-CM | POA: Diagnosis not present

## 2013-02-12 DIAGNOSIS — K6389 Other specified diseases of intestine: Secondary | ICD-10-CM | POA: Diagnosis not present

## 2013-02-12 MED ORDER — PANTOPRAZOLE SODIUM 40 MG PO TBEC
40.0000 mg | DELAYED_RELEASE_TABLET | Freq: Every day | ORAL | Status: DC
  Administered 2013-02-12 – 2013-02-16 (×5): 40 mg via ORAL
  Filled 2013-02-12 (×5): qty 1

## 2013-02-12 MED ORDER — HYDROCODONE-ACETAMINOPHEN 7.5-325 MG PO TABS: 1.0000 | ORAL_TABLET | Freq: Four times a day (QID) | ORAL | Status: DC | PRN

## 2013-02-12 MED ORDER — ALUM & MAG HYDROXIDE-SIMETH 200-200-20 MG/5ML PO SUSP: 30.0000 mL | Freq: Four times a day (QID) | ORAL | Status: DC | PRN

## 2013-02-12 MED ORDER — HYDROCORTISONE SOD SUCCINATE 100 MG IJ SOLR: 25.0000 mg | Freq: Every day | INTRAMUSCULAR | Status: DC

## 2013-02-12 MED ORDER — BOOST / RESOURCE BREEZE PO LIQD: 1.0000 | Freq: Two times a day (BID) | ORAL | Status: DC

## 2013-02-12 NOTE — Progress Notes (Signed)
CSW spoke with [facility] MUST rep and they are requesting a 30 day note from the MD and for CSW to fax Pt clinicals and FL2 in to [facility] MUST and they will evaluate for a PASRR.   CSW will continue to follow Pt for d/c planning.    [facility]  4N 1-16;  [phone] Phone: [phone]  \

## 2013-02-12 NOTE — Progress Notes (Signed)
Called to patient room for CPAP not working. [name]  RRT,RCP checked for me and machine wasn't working. Changed machine and it continued not to work on new machine. Upon further inspection filter was clogged with H2O. When moved to new room yesterday the water must have gotten into the filter and into her tubing blocking all air movement. Problem solved and patient placed on CPAP and doing well. Old machine cleaned and back in department

## 2013-02-12 NOTE — Progress Notes (Signed)
Stoma working. Patient examined and I agree with the assessment and plan  Georganna Skeans, MD, MPH, FACS Pager: [phone]  02/12/2013 11:20 AM

## 2013-02-12 NOTE — Progress Notes (Signed)
CSW spoke with [name] at [phone] concerning PASRR determination and [name] would like to know if the Pt can go back to [facility], as there are two facilities that she would be able to approve in that area.   Weekend CSW will be assisting with PASRR assistance and possible d/c.    [facility]  4N 1-16;  [phone] Phone: [phone]

## 2013-02-12 NOTE — Progress Notes (Signed)
RT called to check pts CPAP machine. Pt stating there was no pressure. RT checked settings, tubing, machine and mask. At this time RT is unable to get machine to work correctly. Will replace machine as soon as possible.

## 2013-02-12 NOTE — Progress Notes (Signed)
Patient ID: [name], female   DOB: 1950/11/25, 63 y.o.   MRN: [id number]   LOS: 10 days  POD#8  Subjective: Doing well, denies N/V. Tolerating diet.   Objective: Vital signs in last 24 hours: Temp:  [97.9 F (36.6 C)-98.3 F (36.8 C)] 98 F (36.7 C) (01/16 9390) Pulse Rate:  [64-78] 78 (01/16 0633) Resp:  [18-28] 20 (01/16 0633) BP: (110-130)/(58-71) 130/71 mmHg (01/16 0633) SpO2:  [99 %-100 %] 100 % (01/16 3009) Weight:  [244 lb 6.4 oz (110.859 kg)-253 lb 1.4 oz (114.8 kg)] 244 lb 6.4 oz (110.859 kg) (01/16 2330) Last BM Date: 02/12/13   Physical Exam General appearance: alert and no distress Resp: clear to auscultation bilaterally Cardio: regular rate and rhythm GI: Soft, +BS, stoma pink, retracted, functional   Assessment/Plan: S/p colectomy/colostomy POD#8 -- Doing well. Staples out Monday. Ready for SNF when bed available.    Lisette Abu, PA-C Pager: [phone] 02/12/2013

## 2013-02-12 NOTE — Progress Notes (Signed)
Patient wants to wear O2 tonight instead of CPAP.

## 2013-02-12 NOTE — Discharge Summary (Addendum)
Physician Discharge Summary  [name] [id number] DOB: 1950/08/12 DOA: 02/02/2013  PCP: Walker Kehr, MD  Admit date: 02/02/2013 Discharge date: 02/12/2013  Time spent: 35 minutes  Recommendations for Outpatient Follow-up:  1. Patient will be discharged to nursing facility.  2. She is to followup with her primary care physician within one week of discharge as well as Dr.Tsuei at the specified date. 3.  Patient continue physical therapy as well as occupational therapy as recommended by the nursing home 4.  She should continue her medications as prescribed.  5. Patient should also continue dysphagia 3 diet. 6. Needs Bid wet-dry dressings to top part of colostomy wound till seen by gen surg 7. Recommend CMEt and CBC in 1 week 8. Consider Sleep study as OP 9. Stopn date for steroids 02/22/13   Discharge Diagnoses:  Primary: Colon cancer status post left hemicolectomy, end colostomy and Hartmann's pouch Active Problems:   OBSTRUCTIVE SLEEP APNEA   HYPERTENSION   Anemia, iron deficiency   Colon obstruction   Hypokalemia   Generalized abdominal pain   Colonic mass   GERD (gastroesophageal reflux disease)   Asthma   Abnormal weight loss   Protein-calorie malnutrition, severe   Shock   Discharge Condition: Stable  Diet recommendation: Dysphagia 3  Filed Weights   02/11/13 0500 02/11/13 1838 02/12/13 [id number]  Weight: 112 kg (246 lb 14.6 oz) 114.8 kg (253 lb 1.4 oz) 110.859 kg (244 lb 6.4 oz)    History of present illness:  [name] is a 63 y.o. female with history of colon polyps, hypertension, OSA, Asthma and GERD, chronic back pain on disability presented to the ED with a chief complaint of abdominal pain. She states that the pain started on the 29th of December. It has been a progressive, severe left lower quadrant abdominal pain that has started to involve the entire abdomen. She states that it is mostly located in her lower left abdomen. She has had  progressive distension of the abdomen over the last several days. She reports having associated diarrhea and vomiting. She has lost 15 pounds weight recently. She denies fevers. She states that she was seen by her PCP recently for this and was told she had some blood in her stool and was treated for diverticulitis. She was told that her blood counts were normal. She was also given some pain medicine. In the ER she was evaluated and determined to have a mass in the colon suspicious for adenocarcinoma. 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 Rough estimation of tumor size 3-4 cm. Also, they reported an obstructed, dilated proximal colon. GI was consulted and recommended admission. Also, surgery was consulted and felt that patient may require colectomy for treatment of this problem. She has a surgical hx of appendectomy, cholecystectomy, and hysterectomy. She states her last colonoscopy was in 2006, and they found a few polyps. She ultimately underwent L hemicolectomy, but then became hypotensive and was transferred to Critical care and stabilized as below   Hospital Course:  Colon cancer s/p Lt hemicolectomy, end colostomy and Hartman's pouch  Ongoing care as per Gen Surgery  She had an area of her wound supra-umbilically which opened and will need to be packed with wet-dry dressings Staples will need to be removed on Monday Patient will be d/c with Oral opiates  Hypokalemia  Mg 1.9 - cont to replace Resolving,  K 3.7  Acute kidney injury  likely 2nd to hypovolemia  Improved and resolved Cr 1.01   Anemia of chronic disease/Iron def Stable, Hb 7.9 on d/c Will need to be monitored  Relative adrenal insufficiency  cortisol 16.6 1/10  - slow steroid taper to be completed 02/22/13  Hx of OSA  Does not appear to use home CPAP -  outpt study once stabilized would be indicated   NON AG acidosis  from saline - resolved   Chronic back pain  on disability, continue pain control   Obesity - Body mass index is 45.15 kg/(m^2).   Procedures/Events 1/8 Left hemicolectomy with mobilization of the splenic flexure, creation of end colostomy and Hartman's pouch  1/9 Hypotension, PCCM consulted, transfer to ICU  1/10 Off pressors  1/11- Nausea relived by NGT placement  1/11 TTE - EF 60% to 65% - grade 1 diastolic dysfunction - PASP bordeline elevated. PA peak pressure: 2mm Hg  Consultations: Gastroenterology General surgery PCCM  Discharge Exam: Filed Vitals:   02/12/13 0633  BP: 130/71  Pulse: 78  Temp: 98 F (36.7 C)  Resp: 20     General: Well developed, well nourished, NAD, appears stated age  HEENT: NCAT, PERRLA, EOMI, Anicteic Sclera, mucous membranes moist. No pharyngeal erythema or exudates  Neck: Supple, no JVD, no masses  Cardiovascular: S1 S2 auscultated, no rubs, murmurs or gallops. Regular rate and rhythm.  Respiratory: Clear to auscultation bilaterally with equal chest rise  Abdomen: Soft, obese, nontender, nondistended, + bowel sounds, Ostomy pink, midline incision clean, staples in place  Extremities: warm dry without cyanosis clubbing. +2 edema in LE B/L  Neuro: AAOx3, cranial nerves grossly intact.   Skin: Without rashes exudates or nodules  Psych: Normal affect and demeanor with intact judgement and insight  Discharge Instructions   Future Appointments Provider Department Dept Phone   03/05/2013 9:00 AM Imogene Burn. [place], [facility], [place]   08/24/2013 10:15 AM Deneise Lever, MD [facility] [phone]       Medication List    ASK your doctor about these medications       albuterol (2.5 MG/3ML) 0.083% nebulizer solution  Commonly known as:  PROVENTIL  Take 3 mLs (2.5 mg total) by nebulization every 6 (six) hours as needed for  wheezing or shortness of breath. DX:  493.90     albuterol 108 (90 BASE) MCG/ACT inhaler  Commonly known as:  PROAIR HFA  Inhale 2 puffs into the lungs every 6 (six) hours as needed for wheezing or shortness of breath.     aspirin 81 MG tablet  Take 81 mg by mouth daily.     ciprofloxacin 500 MG tablet  Commonly known as:  CIPRO  Take 1 tablet (500 mg total) by mouth 2 (two) times daily.     ferrous sulfate 325 (65 FE) MG tablet  Take 1 tablet (325 mg total) by mouth 2 (two) times daily with a meal.     Fluticasone-Salmeterol 250-50 MCG/DOSE Aepb  Commonly known as:  ADVAIR DISKUS  1 puff, then rinse mouth, twice daily     HYDROcodone-acetaminophen 7.5-325 MG per tablet  Commonly known as:  NORCO  Take 1 tablet by mouth 4 (four) times daily as needed.     metroNIDAZOLE 500 MG tablet  Commonly known as:  FLAGYL  Take 1 tablet (500 mg total) by mouth 3 (three) times daily.     mometasone 50 MCG/ACT nasal spray  Commonly known as:  NASONEX  Place 2 sprays into the nose daily as needed.     montelukast 10 MG tablet  Commonly known as:  SINGULAIR  Take 1 tablet (10 mg total) by mouth at bedtime.     omeprazole 20 MG capsule  Commonly known as:  PRILOSEC  Take 1 capsule (20 mg total) by mouth daily.     potassium chloride 10 MEQ tablet  Commonly known as:  K-DUR  Take 10 mEq by mouth daily.     promethazine-codeine 6.25-10 MG/5ML syrup  Commonly known as:  PHENERGAN with CODEINE  Take 5 mLs by mouth every 6 (six) hours as needed for cough.     valsartan-hydrochlorothiazide 160-25 MG per tablet  Commonly known as:  DIOVAN-HCT  Take 1 tablet by mouth daily.     Vitamin D 1000 UNITS capsule  Take 1,000 Units by mouth daily.       No Known Allergies     Follow-up Information   Follow up with Maia Petties., MD On 03/05/2013. (9:00am, arrive by 8:40am)    Specialty:  General Surgery   Contact information:   [address] [place]  [address] [phone]        The results of significant diagnostics from this hospitalization (including imaging, microbiology, ancillary and laboratory) are listed below for reference.    Significant Diagnostic Studies: Ct Abdomen Pelvis W Contrast  02/02/2013   CLINICAL DATA:  63 year old female with abdominal and lower pelvic pain. Vomiting, chills, nausea. Initial encounter.  EXAM: CT ABDOMEN AND PELVIS WITH CONTRAST  TECHNIQUE: Multidetector CT imaging of the abdomen and pelvis was performed using the standard protocol following bolus administration of intravenous contrast.  CONTRAST:  165mL OMNIPAQUE IOHEXOL 300 MG/ML  SOLN  COMPARISON:  [facility] DG ABD ACUTE W/CHEST dated 01/27/2013  FINDINGS: Negative lung bases.  No pericardial or pleural effusion.  No acute osseous abnormality identified. Degenerative changes in the lower lumbar spine including mild spondylolisthesis. Chronic posterior element fusion or ankylosis at the lowest lumbar levels.  Small volume of pelvic free fluid in the central lower pelvis. Uterus surgically absent. At adnexa surgically absent. Decompressed bladder and rectum.  Redundant but decompressed sigmoid colon. The left colon is decompressed. At the splenic flexure there is abrupt tapering of the colon and indistinctness of the colonic wall and lumen (series 2, images 31 -38 and coronal image 82). There are also small but conspicuous adjacent mesenteric lymph nodes individually measuring up to 8 mm short axis (series 2). The margins of this obstructing lesion are indistinct, but estimated size is 3-4 cm (perhaps as large as 6 cm longitudinally).  The upstream colon then is moderately to severely dilated with gas and fluid (dilated redundant transverse colon up to 65 mm diameter, dilated ascending colon and cecum, the latter up to 81 mm diameter. Mildly prominent distal small bowel. No significantly dilated small bowel. Oral contrast in the stomach and proximal small  bowel.  No liver lesion. The gallbladder is surgically absent with associated prominence of the intra and extrahepatic biliary tree. Negative spleen, pancreas and adrenal glands. Negative kidneys. Portal venous system is patent. Major arterial structures in the abdomen and pelvis are patent with no atherosclerosis identified. No abdominal free fluid.  IMPRESSION: 1. 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 Rough estimation of tumor size 3-4 cm. Colonoscopy likely would be the most appropriate next step in  evaluation. 2. Obstructed, dilated proximal colon.  Decompressed distal colon. 3. No liver mass or distant metastatic disease identified.   Electronically Signed   By: Lars Pinks M.D.   On: 02/02/2013 09:23   Dg Chest Port 1 View  02/09/2013   CLINICAL DATA:  Post colostomy  EXAM: PORTABLE CHEST - 1 VIEW  COMPARISON:  02/07/2013; 02/05/2013; 08/24/2012  FINDINGS: Grossly unchanged enlarged cardiac silhouette and mediastinal contours. Interval placement of enteric tube with tip and side port projecting below the left hemidiaphragm. Otherwise, stable positioning of remaining support apparatus. No pneumothorax. Overall improved aeration of the lungs with persistent perihilar and bilateral medial basilar opacities. Mild pulmonary venous congestion without frank evidence of edema. Trace bilateral effusions are not excluded. Grossly unchanged bones.  IMPRESSION: 1. Enteric tube tip and side port projects below the left hemidiaphragm. Otherwise, stable position of remaining support apparatus. 2. Improved aeration of the lungs with persistent perihilar and medial basilar opacities, likely atelectasis. 3. Pulmonary venous congestion without frank evidence of edema.   Electronically Signed   By: Sandi Mariscal M.D.   On: 02/09/2013 07:46   Dg Chest Port 1 View  02/07/2013   CLINICAL  DATA:  Atelectasis and status post partial colectomy.  EXAM: PORTABLE CHEST - 1 VIEW  COMPARISON:  02/05/2013  FINDINGS: Stable low lung volumes with bibasilar atelectasis. No overt edema, consolidation or pleural fluid is seen. The heart size is stable. There is stable positioning of a left jugular central line in the lower SVC.  IMPRESSION: Stable low volumes with bibasilar atelectasis.   Electronically Signed   By: Aletta Edouard M.D.   On: 02/07/2013 08:50   Dg Chest Port 1 View  02/05/2013   CLINICAL DATA:  Line placement.  EXAM: PORTABLE CHEST - 1 VIEW  COMPARISON:  Same day.  FINDINGS: Stable cardiomediastinal silhouette. Interval placement of left internal jugular catheter line with distal tip in expected position of the cavoatrial junction. No pneumothorax is noted. No pleural effusion is noted. No acute pulmonary disease is noted.  IMPRESSION: No pneumothorax seen status post left internal jugular central line placement.   Electronically Signed   By: Sabino Dick M.D.   On: 02/05/2013 17:35   Dg Chest Port 1 View  02/05/2013   CLINICAL DATA:  Weakness, cough  EXAM: PORTABLE CHEST - 1 VIEW  COMPARISON:  DG ABD ACUTE W/CHEST dated 01/27/2013  FINDINGS: There are low lung volumes. There is crowding of the central pulmonary markings secondary to low lung volumes. There is no focal consolidation, pleural effusion or pneumothorax. Stable cardiomediastinal silhouette. Unremarkable osseous structures.  IMPRESSION: Low lung volumes with crowding of the central pulmonary markings. No active disease.   Electronically Signed   By: Kathreen Devoid   On: 02/05/2013 09:28   Dg Abd Acute W/chest  01/27/2013   CLINICAL DATA:  Left lower quadrant pain  EXAM: ACUTE ABDOMEN SERIES (ABDOMEN 2 VIEW & CHEST 1 VIEW)  COMPARISON:  08/16/2012  FINDINGS: Cardiomediastinal silhouette is stable. Dextroscoliosis thoracic spine again noted. No acute infiltrate or pulmonary edema.  Postcholecystectomy surgical clips are noted.  There is nonspecific nonobstructive bowel gas pattern. No free abdominal air.  IMPRESSION: Negative abdominal radiographs.  No acute cardiopulmonary disease.   Electronically Signed   By: Lahoma Crocker M.D.   On: 01/27/2013 14:55   Dg Abd Portable 1v  02/07/2013   CLINICAL DATA:  Abdominal pain, nausea and vomiting. 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  EXAM: PORTABLE  ABDOMEN - 1 VIEW  COMPARISON:  CT on 02/02/2013  FINDINGS: Mildly prominent left-sided small bowel loops may be consistent with focal ileus. No colonic dilatation is identified.  IMPRESSION: Possible mild small bowel ileus.   Electronically Signed   By: Aletta Edouard M.D.   On: 02/07/2013 12:41    Microbiology: Recent Results (from the past 240 hour(s))  SURGICAL PCR SCREEN     Status: None   Collection Time    02/04/13  4:39 AM      Result Value Range Status   MRSA, PCR NEGATIVE  NEGATIVE Final   Staphylococcus aureus NEGATIVE  NEGATIVE Final   Comment:            The Xpert SA Assay (FDA     approved for NASAL specimens     in patients over 77 years of age),     is one component of     a comprehensive surveillance     program.  Test performance has     been validated by [employer] for patients greater     than or equal to 87 year old.     It is not intended     to diagnose infection nor to     guide or monitor treatment.  URINE CULTURE     Status: None   Collection Time    02/05/13  8:41 AM      Result Value Range Status   Specimen Description URINE, CATHETERIZED   Final   Special Requests NONE   Final   Culture  Setup Time     Final   Value: 02/05/2013 09:14     Performed at [facility]     Final   Value: NO GROWTH     Performed at [facility]   Culture     Final   Value: NO GROWTH     Performed at [facility]   Report Status 02/06/2013 FINAL   Final  CULTURE, BLOOD (ROUTINE X 2)     Status: None   Collection Time     02/05/13  9:00 AM      Result Value Range Status   Specimen Description BLOOD LEFT ANTECUBITAL   Final   Special Requests BOTTLES DRAWN AEROBIC ONLY 5CCS   Final   Culture  Setup Time     Final   Value: 02/05/2013 13:15     Performed at [facility]   Culture     Final   Value: NO GROWTH 5 DAYS     Performed at [facility]   Report Status 02/11/2013 FINAL   Final  CULTURE, BLOOD (ROUTINE X 2)     Status: None   Collection Time    02/05/13  9:10 AM      Result Value Range Status   Specimen Description BLOOD LEFT HAND   Final   Special Requests BOTTLES DRAWN AEROBIC ONLY 5CCS   Final   Culture  Setup Time     Final   Value: 02/05/2013 13:14     Performed at [facility]   Culture     Final   Value: NO GROWTH 5 DAYS     Performed at [facility]   Report Status 02/11/2013 FINAL   Final  CLOSTRIDIUM DIFFICILE BY PCR     Status: None   Collection Time    02/07/13 10:55 PM      Result  Value Range Status   C difficile by pcr NEGATIVE  NEGATIVE Final     Labs: Basic Metabolic Panel:  Recent Labs Lab 02/07/13 0442 02/08/13 0500 02/09/13 0400 02/10/13 0400 02/11/13 0316  NA 146 147 149* 141 145  K 3.3* 3.1* 2.6* 3.0* 3.7  CL 113* 115* 116* 109 114*  CO2 21 20 19 19  18*  GLUCOSE 110* 108* 92 101* 98  BUN 17 24* 23 18 16   CREATININE 1.63* 1.46* 1.36* 1.13* 1.01  CALCIUM 8.6 9.0 9.3 9.0 9.0  MG  --   --   --  1.9  --   PHOS  --   --   --  2.8  --    Liver Function Tests:  Recent Labs Lab 02/09/13 0400  AST 15  ALT 14  ALKPHOS 124*  BILITOT 0.5  PROT 6.1  ALBUMIN 2.0*   No results found for this basename: LIPASE, AMYLASE,  in the last 168 hours No results found for this basename: AMMONIA,  in the last 168 hours CBC:  Recent Labs Lab 02/07/13 0442 02/08/13 0500 02/09/13 0400 02/10/13 0400 02/11/13 0316  WBC 14.9* 12.8* 12.6* 11.7* 12.5*  NEUTROABS  --   --  9.2* 9.1*  --   HGB 8.5* 7.8* 8.8* 8.1* 8.5*  HCT 25.1* 22.8*  26.2* 24.6* 26.1*  MCV 65.0* 64.4* 63.9* 64.7* 65.4*  PLT 261 271 333 297 295   Cardiac Enzymes: No results found for this basename: CKTOTAL, CKMB, CKMBINDEX, TROPONINI,  in the last 168 hours BNP: BNP (last 3 results) No results found for this basename: PROBNP,  in the last 8760 hours CBG:  Recent Labs Lab 02/07/13 1941 02/08/13 0806 02/08/13 1152 02/08/13 1721 02/08/13 1949  GLUCAP 112* 100* 106* 101* 123*       Signed:  MIKHAIL, MARYANN  [facility] 02/12/2013, 11:22 AM

## 2013-02-12 NOTE — Progress Notes (Signed)
OT Cancellation Note  Patient Details Name: [name] MRN: [id number] DOB: [birth year]   Cancelled Treatment:    Reason Eval/Treat Not Completed: Other (pt plans to go to SNF when bed available).  Will defer OT services to next venue of care.  02/12/2013 Darrol Jump OTR/L Pager [phone] Office [phone]

## 2013-02-12 NOTE — Progress Notes (Signed)
CSW faxed Pt information into [facility] for a PASRR determination and placement d/c planning.   Weekend CSW to followup for PASSR and placement if possible.    [facility]  [address];  [id number] Phone: [phone]

## 2013-02-13 DIAGNOSIS — D509 Iron deficiency anemia, unspecified: Secondary | ICD-10-CM | POA: Diagnosis not present

## 2013-02-13 DIAGNOSIS — N179 Acute kidney failure, unspecified: Secondary | ICD-10-CM | POA: Diagnosis not present

## 2013-02-13 DIAGNOSIS — I10 Essential (primary) hypertension: Secondary | ICD-10-CM

## 2013-02-13 DIAGNOSIS — R109 Unspecified abdominal pain: Secondary | ICD-10-CM | POA: Diagnosis not present

## 2013-02-13 DIAGNOSIS — K56609 Unspecified intestinal obstruction, unspecified as to partial versus complete obstruction: Secondary | ICD-10-CM | POA: Diagnosis not present

## 2013-02-13 NOTE — Progress Notes (Signed)
Clinical Education officer, museum (CSW) received weekday handoff that patient is medically stable for D/C. Patient's PASARR is currently under review from [facility]. Weekday CSW faxed the necessary paper work to [facility] as evidenced by CSW note dated 02/12/13. Weekend CSW sent a message in [facility] confirming that the requested information was sent. Weekend CSW attempted to contact [facility] however they are closed on the weekends. Weekday CSW will follow up Monday.   Blima Rich, [place] Weekend CSW [phone]

## 2013-02-13 NOTE — Progress Notes (Signed)
Patient not ready for CPAP at this time. Stated they would be ready at 2200. Explained that I or someone from [facility] would return as close that time as possible and if they changed their minds to just have the RN call us.

## 2013-02-13 NOTE — Progress Notes (Addendum)
[facility]                                                                              Patient Demographics  [name], is a 63 y.o. female, DOB - [birth year], [id number]  Admit date - 02/02/2013   Admitting Physician Murlean Iba, MD  Outpatient Primary MD for the patient is Walker Kehr, MD  LOS - 11   Chief Complaint  Patient presents with  . Abdominal Pain        Assessment & Plan   Colon cancer s/p Lt hemicolectomy, end colostomy and Hartman's pouch  -Ongoing care as per Gen Surgery  -Staples will need to be removed on Monday  -Encourage incentive spirometry, out of bed  Hypokalemia  -Mg 1.9 -Resolving, K 3.7, will continue to monitor   Acute kidney injury  -likely secondary to hypovolemia -Improved and resolved, Cr 1.01   Anemia of chronic disease  -Stable, H/h 8.5/26.1  -Will continue to monitor  Relative adrenal insufficiency  -cortisol 16.6 1/10  -slow steroid taper ongoing   Hx of OSA  -Does not appear to use home CPAP  -outpt study once stabilized would be indicated   NON AG acidosis  -from saline - resolved   Chronic back pain  -on disability, continue pain control   Obesity - Body mass index is 45.15 kg/(m^2).  Code Status: Full  Family Communication: None at bedside  Disposition Plan: Admitted.  Discharge still stands, however, pending PASARR and under review.    Time Spent in minutes   20 minutes  Procedures/Events 1/8 Left hemicolectomy with mobilization of the splenic flexure, creation of end colostomy and Hartman's pouch  1/9 Hypotension, PCCM consulted, transfer to ICU  1/10 Off pressors  1/11- Nausea relived by NGT placement  1/11 TTE - EF 60% to 65% - grade 1 diastolic dysfunction - PASP bordeline elevated. PA peak pressure: 25mm Hg  Consults   Gastroenterology General surgery PC CM  DVT Prophylaxis  Lovenox   Lab Results  Component Value Date   PLT 295 02/11/2013    Medications  Scheduled  Meds: . arformoterol  15 mcg Nebulization BID  . budesonide (PULMICORT) nebulizer solution  0.5 mg Nebulization BID  . enoxaparin (LOVENOX) injection  40 mg Subcutaneous Q24H  . feeding supplement (RESOURCE BREEZE)  1 Container Oral BID BM  . hydrocortisone sod succinate (SOLU-CORTEF) inj  25 mg Intravenous Q12H  . montelukast  10 mg Oral QHS  . pantoprazole  40 mg Oral QHS  . pneumococcal 23 valent vaccine  0.5 mL Intramuscular Tomorrow-1000   Continuous Infusions: . sodium chloride 10 mL/hr (02/11/13 2100)   PRN Meds:.albuterol, alum & mag hydroxide-simeth, fentaNYL, fluticasone, ondansetron (ZOFRAN) IV, promethazine  Antibiotics    Anti-infectives   Start     Dose/Rate Route Frequency Ordered Stop   02/07/13 1000  vancomycin (VANCOCIN) 1,500 mg in sodium chloride 0.9 % 500 mL IVPB  Status:  Discontinued     1,500 mg 250 mL/hr over 120 Minutes Intravenous Every 24 hours 02/07/13 0659 02/07/13 0911   02/06/13 1000  vancomycin (VANCOCIN) IVPB 1000 mg/200 mL premix  Status:  Discontinued     1,000 mg 200  mL/hr over 60 Minutes Intravenous Every 24 hours 02/05/13 0757 02/07/13 0659   02/05/13 0830  vancomycin (VANCOCIN) 2,000 mg in sodium chloride 0.9 % 500 mL IVPB     2,000 mg 250 mL/hr over 120 Minutes Intravenous  Once 02/05/13 0757 02/05/13 1100   02/05/13 0815  piperacillin-tazobactam (ZOSYN) IVPB 3.375 g  Status:  Discontinued     3.375 g 12.5 mL/hr over 240 Minutes Intravenous 3 times per day 02/05/13 0757 02/08/13 1016   02/04/13 1515  cefoTEtan (CEFOTAN) 1 g in dextrose 5 % 50 mL IVPB  Status:  Discontinued     1 g 100 mL/hr over 30 Minutes Intravenous Every 12 hours 02/04/13 1511 02/05/13 0851   02/04/13 1045  cefoTEtan (CEFOTAN) 2 g in dextrose 5 % 50 mL IVPB     2 g 100 mL/hr over 30 Minutes Intravenous On call to O.R. 02/04/13 1039 02/04/13 1030        Subjective:   [name] seen and examined today.  Patient has no complaints today.  Objective:    Filed Vitals:   02/12/13 [id number] 02/12/13 1408 02/12/13 2104 02/13/13 0644  BP: 130/71 127/74 134/58 114/50  Pulse: 78 89 61 46  Temp: 98 F (36.7 C) 97.9 F (36.6 C) 98.1 F (36.7 C) 97.6 F (36.4 C)  TempSrc: Oral Oral Oral Oral  Resp: 20 18 20 20   Height:      Weight: 110.859 kg (244 lb 6.4 oz)   110.36 kg (243 lb 4.8 oz)  SpO2: 100% 100% 97% 99%    Wt Readings from Last 3 Encounters:  02/13/13 110.36 kg (243 lb 4.8 oz)  02/13/13 110.36 kg (243 lb 4.8 oz)  02/13/13 110.36 kg (243 lb 4.8 oz)     Intake/Output Summary (Last 24 hours) at 02/13/13 0815 Last data filed at 02/13/13 0646  Gross per 24 hour  Intake    480 ml  Output   3050 ml  Net  -2570 ml    Exam General: Well developed, well nourished, NAD, appears stated age  HEENT: NCAT, PERRLA, EOMI, Anicteic Sclera, mucous membranes moist. No pharyngeal erythema or exudates  Neck: Supple, no JVD, no masses  Cardiovascular: S1 S2 auscultated, no rubs, murmurs or gallops. Regular rate and rhythm.  Respiratory: Clear to auscultation bilaterally with equal chest rise  Abdomen: Soft, obese, nontender, nondistended, + bowel sounds, Ostomy pink, midline incision clean, staples in place  Extremities: warm dry without cyanosis clubbing. +2 edema in LE B/L  Neuro: AAOx3, cranial nerves grossly intact.  Skin: Without rashes exudates or nodules  Psych: Normal affect and demeanor with intact judgement and insight  Data Review   Micro Results Recent Results (from the past 240 hour(s))  SURGICAL PCR SCREEN     Status: None   Collection Time    02/04/13  4:39 AM      Result Value Range Status   MRSA, PCR NEGATIVE  NEGATIVE Final   Staphylococcus aureus NEGATIVE  NEGATIVE Final   Comment:            The Xpert SA Assay (FDA     approved for NASAL specimens     in patients over 67 years of age),     is one component of     a comprehensive surveillance     program.  Test performance has     been validated by [employer] for patients greater     than or equal to 1  year old.     It is not intended     to diagnose infection nor to     guide or monitor treatment.  URINE CULTURE     Status: None   Collection Time    02/05/13  8:41 AM      Result Value Range Status   Specimen Description URINE, CATHETERIZED   Final   Special Requests NONE   Final   Culture  Setup Time     Final   Value: 02/05/2013 09:14     Performed at [place]     Final   Value: NO GROWTH     Performed at [facility]   Culture     Final   Value: NO GROWTH     Performed at [facility]   Report Status 02/06/2013 FINAL   Final  CULTURE, BLOOD (ROUTINE X 2)     Status: None   Collection Time    02/05/13  9:00 AM      Result Value Range Status   Specimen Description BLOOD LEFT ANTECUBITAL   Final   Special Requests BOTTLES DRAWN AEROBIC ONLY 5CCS   Final   Culture  Setup Time     Final   Value: 02/05/2013 13:15     Performed at [facility]   Culture     Final   Value: NO GROWTH 5 DAYS     Performed at [facility]   Report Status 02/11/2013 FINAL   Final  CULTURE, BLOOD (ROUTINE X 2)     Status: None   Collection Time    02/05/13  9:10 AM      Result Value Range Status   Specimen Description BLOOD LEFT HAND   Final   Special Requests BOTTLES DRAWN AEROBIC ONLY 5CCS   Final   Culture  Setup Time     Final   Value: 02/05/2013 13:14     Performed at [facility]   Culture     Final   Value: NO GROWTH 5 DAYS     Performed at [facility]   Report Status 02/11/2013 FINAL   Final  CLOSTRIDIUM DIFFICILE BY PCR     Status: None   Collection Time    02/07/13 10:55 PM      Result Value Range Status   C difficile by pcr NEGATIVE  NEGATIVE Final    Radiology Reports Ct Abdomen Pelvis W Contrast  02/02/2013   CLINICAL DATA:  63 year old female with abdominal and lower pelvic pain. Vomiting, chills, nausea. Initial encounter.  EXAM: CT ABDOMEN AND  PELVIS WITH CONTRAST  TECHNIQUE: Multidetector CT imaging of the abdomen and pelvis was performed using the standard protocol following bolus administration of intravenous contrast.  CONTRAST:  120mL OMNIPAQUE IOHEXOL 300 MG/ML  SOLN  COMPARISON:  [place] DG ABD ACUTE W/CHEST dated 01/27/2013  FINDINGS: Negative lung bases.  No pericardial or pleural effusion.  No acute osseous abnormality identified. Degenerative changes in the lower lumbar spine including mild spondylolisthesis. Chronic posterior element fusion or ankylosis at the lowest lumbar levels.  Small volume of pelvic free fluid in the central lower pelvis. Uterus surgically absent. At adnexa surgically absent. Decompressed bladder and rectum.  Redundant but decompressed sigmoid colon. The left colon is decompressed. At the splenic flexure there is abrupt tapering of the colon and indistinctness of the colonic wall and lumen (series 2, images 31 -38 and coronal image 82). There are  also small but conspicuous adjacent mesenteric lymph nodes individually measuring up to 8 mm short axis (series 2). The margins of this obstructing lesion are indistinct, but estimated size is 3-4 cm (perhaps as large as 6 cm longitudinally).  The upstream colon then is moderately to severely dilated with gas and fluid (dilated redundant transverse colon up to 65 mm diameter, dilated ascending colon and cecum, the latter up to 81 mm diameter. Mildly prominent distal small bowel. No significantly dilated small bowel. Oral contrast in the stomach and proximal small bowel.  No liver lesion. The gallbladder is surgically absent with associated prominence of the intra and extrahepatic biliary tree. Negative spleen, pancreas and adrenal glands. Negative kidneys. Portal venous system is patent. Major arterial structures in the abdomen and pelvis are patent with no atherosclerosis identified. No abdominal free fluid.  IMPRESSION: 1. 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 Rough estimation of tumor size 3-4 cm. Colonoscopy likely would be the most appropriate next step in evaluation. 2. Obstructed, dilated proximal colon.  Decompressed distal colon. 3. No liver mass or distant metastatic disease identified.   Electronically Signed   By: Lars Pinks M.D.   On: 02/02/2013 09:23   Dg Chest Port 1 View  02/09/2013   CLINICAL DATA:  Post colostomy  EXAM: PORTABLE CHEST - 1 VIEW  COMPARISON:  02/07/2013; 02/05/2013; 08/24/2012  FINDINGS: Grossly unchanged enlarged cardiac silhouette and mediastinal contours. Interval placement of enteric tube with tip and side port projecting below the left hemidiaphragm. Otherwise, stable positioning of remaining support apparatus. No pneumothorax. Overall improved aeration of the lungs with persistent perihilar and bilateral medial basilar opacities. Mild pulmonary venous congestion without frank evidence of edema. Trace bilateral effusions are not excluded. Grossly unchanged bones.  IMPRESSION: 1. Enteric tube tip and side port projects below the left hemidiaphragm. Otherwise, stable position of remaining support apparatus. 2. Improved aeration of the lungs with persistent perihilar and medial basilar opacities, likely atelectasis. 3. Pulmonary venous congestion without frank evidence of edema.   Electronically Signed   By: Sandi Mariscal M.D.   On: 02/09/2013 07:46   Dg Chest Port 1 View  02/07/2013   CLINICAL DATA:  Atelectasis and status post partial colectomy.  EXAM: PORTABLE CHEST - 1 VIEW  COMPARISON:  02/05/2013  FINDINGS: Stable low lung volumes with bibasilar atelectasis. No overt edema, consolidation or pleural fluid is seen. The heart size is stable. There is stable positioning of a left jugular central line in the lower SVC.  IMPRESSION: Stable low volumes with bibasilar atelectasis.   Electronically  Signed   By: Aletta Edouard M.D.   On: 02/07/2013 08:50   Dg Chest Port 1 View  02/05/2013   CLINICAL DATA:  Line placement.  EXAM: PORTABLE CHEST - 1 VIEW  COMPARISON:  Same day.  FINDINGS: Stable cardiomediastinal silhouette. Interval placement of left internal jugular catheter line with distal tip in expected position of the cavoatrial junction. No pneumothorax is noted. No pleural effusion is noted. No acute pulmonary disease is noted.  IMPRESSION: No pneumothorax seen status post left internal jugular central line placement.   Electronically Signed   By: Sabino Dick M.D.   On: 02/05/2013 17:35   Dg Chest Port 1 View  02/05/2013   CLINICAL DATA:  Weakness, cough  EXAM: PORTABLE CHEST - 1 VIEW  COMPARISON:  DG ABD ACUTE W/CHEST dated 01/27/2013  FINDINGS:  There are low lung volumes. There is crowding of the central pulmonary markings secondary to low lung volumes. There is no focal consolidation, pleural effusion or pneumothorax. Stable cardiomediastinal silhouette. Unremarkable osseous structures.  IMPRESSION: Low lung volumes with crowding of the central pulmonary markings. No active disease.   Electronically Signed   By: Kathreen Devoid   On: 02/05/2013 09:28   Dg Abd Acute W/chest  01/27/2013   CLINICAL DATA:  Left lower quadrant pain  EXAM: ACUTE ABDOMEN SERIES (ABDOMEN 2 VIEW & CHEST 1 VIEW)  COMPARISON:  08/16/2012  FINDINGS: Cardiomediastinal silhouette is stable. Dextroscoliosis thoracic spine again noted. No acute infiltrate or pulmonary edema.  Postcholecystectomy surgical clips are noted. There is nonspecific nonobstructive bowel gas pattern. No free abdominal air.  IMPRESSION: Negative abdominal radiographs.  No acute cardiopulmonary disease.   Electronically Signed   By: Lahoma Crocker M.D.   On: 01/27/2013 14:55   Dg Abd Portable 1v  02/07/2013   CLINICAL DATA:  Abdominal pain, nausea and vomiting. 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  EXAM: PORTABLE  ABDOMEN - 1 VIEW  COMPARISON:  CT on 02/02/2013  FINDINGS: Mildly prominent left-sided small bowel loops may be consistent with focal ileus. No colonic dilatation is identified.  IMPRESSION: Possible mild small bowel ileus.   Electronically Signed   By: Aletta Edouard M.D.   On: 02/07/2013 12:41    CBC  Recent Labs Lab 02/07/13 0442 02/08/13 0500 02/09/13 0400 02/10/13 0400 02/11/13 0316  WBC 14.9* 12.8* 12.6* 11.7* 12.5*  HGB 8.5* 7.8* 8.8* 8.1* 8.5*  HCT 25.1* 22.8* 26.2* 24.6* 26.1*  PLT 261 271 333 297 295  MCV 65.0* 64.4* 63.9* 64.7* 65.4*  MCH 22.0* 22.0* 21.5* 21.3* 21.3*  MCHC 33.9 34.2 33.6 32.9 32.6  RDW 18.3* 18.1* 18.3* 18.1* 18.0*  LYMPHSABS  --   --  1.5 1.2  --   MONOABS  --   --  1.9* 1.4*  --   EOSABS  --   --  0.0 0.0  --   BASOSABS  --   --  0.0 0.0  --     Chemistries   Recent Labs Lab 02/07/13 0442 02/08/13 0500 02/09/13 0400 02/10/13 0400 02/11/13 0316  NA 146 147 149* 141 145  K 3.3* 3.1* 2.6* 3.0* 3.7  CL 113* 115* 116* 109 114*  CO2 21 20 19 19  18*  GLUCOSE 110* 108* 92 101* 98  BUN 17 24* 23 18 16   CREATININE 1.63* 1.46* 1.36* 1.13* 1.01  CALCIUM 8.6 9.0 9.3 9.0 9.0  MG  --   --   --  1.9  --   AST  --   --  15  --   --   ALT  --   --  14  --   --   ALKPHOS  --   --  124*  --   --   BILITOT  --   --  0.5  --   --    ------------------------------------------------------------------------------------------------------------------ estimated creatinine clearance is 66.8 ml/min (by C-G formula based on Cr of 1.01). ------------------------------------------------------------------------------------------------------------------ No results found for this basename: HGBA1C,  in the last 72 hours ------------------------------------------------------------------------------------------------------------------ No results found for this basename: CHOL, HDL, LDLCALC, TRIG, CHOLHDL, LDLDIRECT,  in the last 72  hours ------------------------------------------------------------------------------------------------------------------ No results found for this basename: TSH, T4TOTAL, FREET3, T3FREE, THYROIDAB,  in the last 72 hours ------------------------------------------------------------------------------------------------------------------ No results found for this basename: VITAMINB12, FOLATE, FERRITIN, TIBC, IRON, RETICCTPCT,  in the  last 72 hours  Coagulation profile No results found for this basename: INR, PROTIME,  in the last 168 hours  No results found for this basename: DDIMER,  in the last 72 hours  Cardiac Enzymes No results found for this basename: CK, CKMB, TROPONINI, MYOGLOBIN,  in the last 168 hours ------------------------------------------------------------------------------------------------------------------ No components found with this basename: POCBNP,     Giankarlo Leamer D.O. on 02/13/2013 at 8:15 AM  Between 7am to 7pm - Pager - [phone]  After 7pm go to [email or web address] - password [email or web address]  And look for the night coverage person covering for me after hours  [facility] Office  [phone]

## 2013-02-13 NOTE — Progress Notes (Signed)
9 Days Post-Op  Subjective: No complaints.   Objective: Vital signs in last 24 hours: Temp:  [97.6 F (36.4 C)-98.1 F (36.7 C)] 97.6 F (36.4 C) (01/17 0644) Pulse Rate:  [46-89] 46 (01/17 0644) Resp:  [18-20] 20 (01/17 0644) BP: (114-134)/(50-74) 114/50 mmHg (01/17 0644) SpO2:  [97 %-100 %] 99 % (01/17 0644) Weight:  [243 lb 4.8 oz (110.36 kg)] 243 lb 4.8 oz (110.36 kg) (01/17 0644) Last BM Date: 02/12/13  Intake/Output from previous day: 01/16 0701 - 01/17 0700 In: 480 [P.O.:480] Out: 3050 [Urine:2300; Stool:750] Intake/Output this shift:    Resp: clear to auscultation bilaterally Cardio: regular rate and rhythm GI: soft, nontender. incision looks good. ostomy retracted but functional  Lab Results:   Recent Labs  02/11/13 0316  WBC 12.5*  HGB 8.5*  HCT 26.1*  PLT 295   BMET  Recent Labs  02/11/13 0316  NA 145  K 3.7  CL 114*  CO2 18*  GLUCOSE 98  BUN 16  CREATININE 1.01  CALCIUM 9.0   PT/INR No results found for this basename: LABPROT, INR,  in the last 72 hours ABG No results found for this basename: PHART, PCO2, PO2, HCO3,  in the last 72 hours  Studies/Results: No results found.  Anti-infectives: Anti-infectives   Start     Dose/Rate Route Frequency Ordered Stop   02/07/13 1000  vancomycin (VANCOCIN) 1,500 mg in sodium chloride 0.9 % 500 mL IVPB  Status:  Discontinued     1,500 mg 250 mL/hr over 120 Minutes Intravenous Every 24 hours 02/07/13 0659 02/07/13 0911   02/06/13 1000  vancomycin (VANCOCIN) IVPB 1000 mg/200 mL premix  Status:  Discontinued     1,000 mg 200 mL/hr over 60 Minutes Intravenous Every 24 hours 02/05/13 0757 02/07/13 0659   02/05/13 0830  vancomycin (VANCOCIN) 2,000 mg in sodium chloride 0.9 % 500 mL IVPB     2,000 mg 250 mL/hr over 120 Minutes Intravenous  Once 02/05/13 0757 02/05/13 1100   02/05/13 0815  piperacillin-tazobactam (ZOSYN) IVPB 3.375 g  Status:  Discontinued     3.375 g 12.5 mL/hr over 240 Minutes  Intravenous 3 times per day 02/05/13 0757 02/08/13 1016   02/04/13 1515  cefoTEtan (CEFOTAN) 1 g in dextrose 5 % 50 mL IVPB  Status:  Discontinued     1 g 100 mL/hr over 30 Minutes Intravenous Every 12 hours 02/04/13 1511 02/05/13 0851   02/04/13 1045  cefoTEtan (CEFOTAN) 2 g in dextrose 5 % 50 mL IVPB     2 g 100 mL/hr over 30 Minutes Intravenous On call to O.R. 02/04/13 1039 02/04/13 1030      Assessment/Plan: s/p Procedure(s): PARTIAL COLECTOMY (N/A) COLOSTOMY (Right) Continue diet Ambulate Await SNF placement  LOS: 11 days    [name],Trulee Hamstra S 02/13/2013

## 2013-02-14 DIAGNOSIS — K56609 Unspecified intestinal obstruction, unspecified as to partial versus complete obstruction: Secondary | ICD-10-CM | POA: Diagnosis not present

## 2013-02-14 DIAGNOSIS — R109 Unspecified abdominal pain: Secondary | ICD-10-CM | POA: Diagnosis not present

## 2013-02-14 DIAGNOSIS — N179 Acute kidney failure, unspecified: Secondary | ICD-10-CM | POA: Diagnosis not present

## 2013-02-14 DIAGNOSIS — D509 Iron deficiency anemia, unspecified: Secondary | ICD-10-CM | POA: Diagnosis not present

## 2013-02-14 LAB — CBC
HCT: 23 % — ABNORMAL LOW (ref 36.0–46.0)
HEMOGLOBIN: 7.5 g/dL — AB (ref 12.0–15.0)
MCH: 21.2 pg — ABNORMAL LOW (ref 26.0–34.0)
MCHC: 32.6 g/dL (ref 30.0–36.0)
MCV: 65 fL — ABNORMAL LOW (ref 78.0–100.0)
PLATELETS: 341 10*3/uL (ref 150–400)
RBC: 3.54 MIL/uL — ABNORMAL LOW (ref 3.87–5.11)
RDW: 18.5 % — AB (ref 11.5–15.5)
WBC: 13 10*3/uL — ABNORMAL HIGH (ref 4.0–10.5)

## 2013-02-14 LAB — BASIC METABOLIC PANEL
BUN: 12 mg/dL (ref 6–23)
CALCIUM: 8.5 mg/dL (ref 8.4–10.5)
CO2: 20 mEq/L (ref 19–32)
Chloride: 111 mEq/L (ref 96–112)
Creatinine, Ser: 1.04 mg/dL (ref 0.50–1.10)
GFR calc non Af Amer: 56 mL/min — ABNORMAL LOW (ref >90)
GFR, EST AFRICAN AMERICAN: 65 mL/min — AB (ref >90)
Glucose, Bld: 98 mg/dL (ref 70–99)
POTASSIUM: 3.4 meq/L — AB (ref 3.7–5.3)
SODIUM: 145 meq/L (ref 137–147)

## 2013-02-14 MED ORDER — POTASSIUM CHLORIDE CRYS ER 20 MEQ PO TBCR
40.0000 meq | EXTENDED_RELEASE_TABLET | Freq: Once | ORAL | Status: AC
  Administered 2013-02-14: 40 meq via ORAL
  Filled 2013-02-14: qty 2

## 2013-02-14 MED ORDER — HYDROCODONE-ACETAMINOPHEN 5-325 MG PO TABS
1.0000 | ORAL_TABLET | Freq: Four times a day (QID) | ORAL | Status: DC | PRN
  Administered 2013-02-14 – 2013-02-16 (×5): 1 via ORAL
  Filled 2013-02-14 (×5): qty 1

## 2013-02-14 NOTE — Progress Notes (Signed)
Patient ID: [name], female   DOB: 1950-02-22, 63 y.o.   MRN: [id number] 10 Days Post-Op  Subjective: No complaints.   Objective: Vital signs in last 24 hours: Temp:  [98.6 F (37 C)-98.7 F (37.1 C)] 98.7 F (37.1 C) (01/18 0520) Pulse Rate:  [56-62] 60 (01/18 0520) Resp:  [18-20] 18 (01/18 0520) BP: (98-119)/(50-79) 104/50 mmHg (01/18 0520) SpO2:  [98 %-100 %] 98 % (01/18 0955) Weight:  [242 lb 15.2 oz (110.2 kg)] 242 lb 15.2 oz (110.2 kg) (01/18 0520) Last BM Date: 02/13/13  Intake/Output from previous day: 01/17 0701 - 01/18 0700 In: -  Out: 2120 [Urine:1295; Stool:825] Intake/Output this shift: Total I/O In: -  Out: 300 [Urine:250; Stool:50]  Resp: clear to auscultation bilaterally Cardio: regular rate and rhythm GI: soft, nontender. incision looks good. ostomy retracted but functional  Lab Results:   Recent Labs  02/14/13 0550  WBC 13.0*  HGB 7.5*  HCT 23.0*  PLT 341   BMET  Recent Labs  02/14/13 0550  NA 145  K 3.4*  CL 111  CO2 20  GLUCOSE 98  BUN 12  CREATININE 1.04  CALCIUM 8.5   PT/INR No results found for this basename: LABPROT, INR,  in the last 72 hours ABG No results found for this basename: PHART, PCO2, PO2, HCO3,  in the last 72 hours  Studies/Results: No results found.  Anti-infectives: Anti-infectives   Start     Dose/Rate Route Frequency Ordered Stop   02/07/13 1000  vancomycin (VANCOCIN) 1,500 mg in sodium chloride 0.9 % 500 mL IVPB  Status:  Discontinued     1,500 mg 250 mL/hr over 120 Minutes Intravenous Every 24 hours 02/07/13 0659 02/07/13 0911   02/06/13 1000  vancomycin (VANCOCIN) IVPB 1000 mg/200 mL premix  Status:  Discontinued     1,000 mg 200 mL/hr over 60 Minutes Intravenous Every 24 hours 02/05/13 0757 02/07/13 0659   02/05/13 0830  vancomycin (VANCOCIN) 2,000 mg in sodium chloride 0.9 % 500 mL IVPB     2,000 mg 250 mL/hr over 120 Minutes Intravenous  Once 02/05/13 0757 02/05/13 1100   02/05/13 0815   piperacillin-tazobactam (ZOSYN) IVPB 3.375 g  Status:  Discontinued     3.375 g 12.5 mL/hr over 240 Minutes Intravenous 3 times per day 02/05/13 0757 02/08/13 1016   02/04/13 1515  cefoTEtan (CEFOTAN) 1 g in dextrose 5 % 50 mL IVPB  Status:  Discontinued     1 g 100 mL/hr over 30 Minutes Intravenous Every 12 hours 02/04/13 1511 02/05/13 0851   02/04/13 1045  cefoTEtan (CEFOTAN) 2 g in dextrose 5 % 50 mL IVPB     2 g 100 mL/hr over 30 Minutes Intravenous On call to O.R. 02/04/13 1039 02/04/13 1030      Assessment/Plan: s/p Procedure(s): PARTIAL COLECTOMY (N/A) COLOSTOMY (Right) Continue diet Ambulate Await SNF placement  LOS: 12 days    [name],Anushree Dorsi S 02/14/2013

## 2013-02-14 NOTE — Progress Notes (Signed)
[facility]                                                                              Patient Demographics  [name], is a 63 y.o. female, DOB - 21-Mar-1950, SVX:[id number]  Admit date - 02/02/2013   Admitting Physician Murlean Iba, MD  Outpatient Primary MD for the patient is Walker Kehr, MD  LOS - 12   Chief Complaint  Patient presents with  . Abdominal Pain        Assessment & Plan   Colon cancer s/p Lt hemicolectomy, end colostomy and Hartman's pouch  -Ongoing care as per Gen Surgery  -Staples will need to be removed on Monday  -Encourage incentive spirometry, out of bed  Hypokalemia  -Mg 1.9 -K3.4, will replaec and continue to monitor  Acute kidney injury  -likely secondary to hypovolemia -Improved and resolved, Cr 1.04  Anemia of chronic disease  -Stable, H/h 7.5/23 -Will continue to monitor  Relative adrenal insufficiency  -cortisol 16.6 1/10  -slow steroid taper ongoing   Hx of OSA  -Does not appear to use home CPAP  -outpt study once stabilized would be indicated   NON AG acidosis  -from saline - resolved   Chronic back pain  -on disability, continue pain control   Obesity - Body mass index is 45.15 kg/(m^2).  Code Status: Full  Family Communication: None at bedside  Disposition Plan: Admitted.  Discharge still stands, however, pending PASARR and under review.    Time Spent in minutes   20 minutes  Procedures/Events 1/8 Left hemicolectomy with mobilization of the splenic flexure, creation of end colostomy and Hartman's pouch  1/9 Hypotension, PCCM consulted, transfer to ICU  1/10 Off pressors  1/11- Nausea relived by NGT placement  1/11 TTE - EF 60% to 65% - grade 1 diastolic dysfunction - PASP bordeline elevated. PA peak pressure: 67mm Hg  Consults   Gastroenterology General surgery PCCM  DVT Prophylaxis  Lovenox   Lab Results  Component Value Date   PLT 341 02/14/2013    Medications  Scheduled  Meds: . arformoterol  15 mcg Nebulization BID  . budesonide (PULMICORT) nebulizer solution  0.5 mg Nebulization BID  . enoxaparin (LOVENOX) injection  40 mg Subcutaneous Q24H  . feeding supplement (RESOURCE BREEZE)  1 Container Oral BID BM  . hydrocortisone sod succinate (SOLU-CORTEF) inj  25 mg Intravenous Q12H  . montelukast  10 mg Oral QHS  . pantoprazole  40 mg Oral QHS  . pneumococcal 23 valent vaccine  0.5 mL Intramuscular Tomorrow-1000   Continuous Infusions: . sodium chloride 10 mL/hr (02/11/13 2100)   PRN Meds:.albuterol, alum & mag hydroxide-simeth, fentaNYL, fluticasone, ondansetron (ZOFRAN) IV, promethazine  Antibiotics    Anti-infectives   Start     Dose/Rate Route Frequency Ordered Stop   02/07/13 1000  vancomycin (VANCOCIN) 1,500 mg in sodium chloride 0.9 % 500 mL IVPB  Status:  Discontinued     1,500 mg 250 mL/hr over 120 Minutes Intravenous Every 24 hours 02/07/13 0659 02/07/13 0911   02/06/13 1000  vancomycin (VANCOCIN) IVPB 1000 mg/200 mL premix  Status:  Discontinued     1,000 mg 200 mL/hr over 60 Minutes  Intravenous Every 24 hours 02/05/13 0757 02/07/13 0659   02/05/13 0830  vancomycin (VANCOCIN) 2,000 mg in sodium chloride 0.9 % 500 mL IVPB     2,000 mg 250 mL/hr over 120 Minutes Intravenous  Once 02/05/13 0757 02/05/13 1100   02/05/13 0815  piperacillin-tazobactam (ZOSYN) IVPB 3.375 g  Status:  Discontinued     3.375 g 12.5 mL/hr over 240 Minutes Intravenous 3 times per day 02/05/13 0757 02/08/13 1016   02/04/13 1515  cefoTEtan (CEFOTAN) 1 g in dextrose 5 % 50 mL IVPB  Status:  Discontinued     1 g 100 mL/hr over 30 Minutes Intravenous Every 12 hours 02/04/13 1511 02/05/13 0851   02/04/13 1045  cefoTEtan (CEFOTAN) 2 g in dextrose 5 % 50 mL IVPB     2 g 100 mL/hr over 30 Minutes Intravenous On call to O.R. 02/04/13 1039 02/04/13 1030        Subjective:   [name] seen and examined today.  Patient has no complaints today.  She wishes to bath.    Objective:   Filed Vitals:   02/13/13 1313 02/13/13 2106 02/14/13 0003 02/14/13 0520  BP: 119/79 98/52  104/50  Pulse: 56 59 62 60  Temp: 98.6 F (37 C) 98.7 F (37.1 C)  98.7 F (37.1 C)  TempSrc: Oral Oral  Oral  Resp: 20 18 20 18   Height:      Weight:    110.2 kg (242 lb 15.2 oz)  SpO2: 99% 99% 99% 100%    Wt Readings from Last 3 Encounters:  02/14/13 110.2 kg (242 lb 15.2 oz)  02/14/13 110.2 kg (242 lb 15.2 oz)  02/14/13 110.2 kg (242 lb 15.2 oz)     Intake/Output Summary (Last 24 hours) at 02/14/13 0818 Last data filed at 02/14/13 0403  Gross per 24 hour  Intake      0 ml  Output   2120 ml  Net  -2120 ml    Exam General: Well developed, well nourished, NAD, appears stated age  HEENT: NCAT, PERRLA, EOMI, Anicteic Sclera, mucous membranes moist. Neck: Supple, no JVD, no masses  Cardiovascular: S1 S2 auscultated, no rubs, murmurs or gallops. Regular rate and rhythm.  Respiratory: Clear to auscultation bilaterally with equal chest rise  Abdomen: Soft, obese, nontender, nondistended, + bowel sounds, Ostomy pink, midline incision clean, staples in place  Extremities: warm dry without cyanosis clubbing. +2 edema in LE B/L  Neuro: AAOx3, cranial nerves grossly intact.  Skin: Without rashes exudates or nodules  Psych: Normal affect and demeanor with intact judgement and insight  Data Review   Micro Results Recent Results (from the past 240 hour(s))  URINE CULTURE     Status: None   Collection Time    02/05/13  8:41 AM      Result Value Range Status   Specimen Description URINE, CATHETERIZED   Final   Special Requests NONE   Final   Culture  Setup Time     Final   Value: 02/05/2013 09:14     Performed at [facility]     Final   Value: NO GROWTH     Performed at [facility]   Culture     Final   Value: NO GROWTH     Performed at [facility]   Report Status 02/06/2013 FINAL   Final  CULTURE, BLOOD (ROUTINE X 2)      Status: None   Collection Time  02/05/13  9:00 AM      Result Value Range Status   Specimen Description BLOOD LEFT ANTECUBITAL   Final   Special Requests BOTTLES DRAWN AEROBIC ONLY 5CCS   Final   Culture  Setup Time     Final   Value: 02/05/2013 13:15     Performed at [facility]   Culture     Final   Value: NO GROWTH 5 DAYS     Performed at [facility]   Report Status 02/11/2013 FINAL   Final  CULTURE, BLOOD (ROUTINE X 2)     Status: None   Collection Time    02/05/13  9:10 AM      Result Value Range Status   Specimen Description BLOOD LEFT HAND   Final   Special Requests BOTTLES DRAWN AEROBIC ONLY 5CCS   Final   Culture  Setup Time     Final   Value: 02/05/2013 13:14     Performed at [facility]   Culture     Final   Value: NO GROWTH 5 DAYS     Performed at [facility]   Report Status 02/11/2013 FINAL   Final  CLOSTRIDIUM DIFFICILE BY PCR     Status: None   Collection Time    02/07/13 10:55 PM      Result Value Range Status   C difficile by pcr NEGATIVE  NEGATIVE Final    Radiology Reports Ct Abdomen Pelvis W Contrast  02/02/2013   CLINICAL DATA:  64 year old female with abdominal and lower pelvic pain. Vomiting, chills, nausea. Initial encounter.  EXAM: CT ABDOMEN AND PELVIS WITH CONTRAST  TECHNIQUE: Multidetector CT imaging of the abdomen and pelvis was performed using the standard protocol following bolus administration of intravenous contrast.  CONTRAST:  122mL OMNIPAQUE IOHEXOL 300 MG/ML  SOLN  COMPARISON:  [facility] DG ABD ACUTE W/CHEST dated 01/27/2013  FINDINGS: Negative lung bases.  No pericardial or pleural effusion.  No acute osseous abnormality identified. Degenerative changes in the lower lumbar spine including mild spondylolisthesis. Chronic posterior element fusion or ankylosis at the lowest lumbar levels.  Small volume of pelvic free fluid in the central lower pelvis. Uterus surgically absent. At adnexa surgically  absent. Decompressed bladder and rectum.  Redundant but decompressed sigmoid colon. The left colon is decompressed. At the splenic flexure there is abrupt tapering of the colon and indistinctness of the colonic wall and lumen (series 2, images 31 -38 and coronal image 82). There are also small but conspicuous adjacent mesenteric lymph nodes individually measuring up to 8 mm short axis (series 2). The margins of this obstructing lesion are indistinct, but estimated size is 3-4 cm (perhaps as large as 6 cm longitudinally).  The upstream colon then is moderately to severely dilated with gas and fluid (dilated redundant transverse colon up to 65 mm diameter, dilated ascending colon and cecum, the latter up to 81 mm diameter. Mildly prominent distal small bowel. No significantly dilated small bowel. Oral contrast in the stomach and proximal small bowel.  No liver lesion. The gallbladder is surgically absent with associated prominence of the intra and extrahepatic biliary tree. Negative spleen, pancreas and adrenal glands. Negative kidneys. Portal venous system is patent. Major arterial structures in the abdomen and pelvis are patent with no atherosclerosis identified. No abdominal free fluid.  IMPRESSION: 1. 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 Rough estimation  of tumor size 3-4 cm. Colonoscopy likely would be the most appropriate next step in evaluation. 2. Obstructed, dilated proximal colon.  Decompressed distal colon. 3. No liver mass or distant metastatic disease identified.   Electronically Signed   By: Lars Pinks M.D.   On: 02/02/2013 09:23   Dg Chest Port 1 View  02/09/2013   CLINICAL DATA:  Post colostomy  EXAM: PORTABLE CHEST - 1 VIEW  COMPARISON:  02/07/2013; 02/05/2013; 08/24/2012  FINDINGS: Grossly unchanged enlarged cardiac silhouette and mediastinal contours.  Interval placement of enteric tube with tip and side port projecting below the left hemidiaphragm. Otherwise, stable positioning of remaining support apparatus. No pneumothorax. Overall improved aeration of the lungs with persistent perihilar and bilateral medial basilar opacities. Mild pulmonary venous congestion without frank evidence of edema. Trace bilateral effusions are not excluded. Grossly unchanged bones.  IMPRESSION: 1. Enteric tube tip and side port projects below the left hemidiaphragm. Otherwise, stable position of remaining support apparatus. 2. Improved aeration of the lungs with persistent perihilar and medial basilar opacities, likely atelectasis. 3. Pulmonary venous congestion without frank evidence of edema.   Electronically Signed   By: Sandi Mariscal M.D.   On: 02/09/2013 07:46   Dg Chest Port 1 View  02/07/2013   CLINICAL DATA:  Atelectasis and status post partial colectomy.  EXAM: PORTABLE CHEST - 1 VIEW  COMPARISON:  02/05/2013  FINDINGS: Stable low lung volumes with bibasilar atelectasis. No overt edema, consolidation or pleural fluid is seen. The heart size is stable. There is stable positioning of a left jugular central line in the lower SVC.  IMPRESSION: Stable low volumes with bibasilar atelectasis.   Electronically Signed   By: Aletta Edouard M.D.   On: 02/07/2013 08:50   Dg Chest Port 1 View  02/05/2013   CLINICAL DATA:  Line placement.  EXAM: PORTABLE CHEST - 1 VIEW  COMPARISON:  Same day.  FINDINGS: Stable cardiomediastinal silhouette. Interval placement of left internal jugular catheter line with distal tip in expected position of the cavoatrial junction. No pneumothorax is noted. No pleural effusion is noted. No acute pulmonary disease is noted.  IMPRESSION: No pneumothorax seen status post left internal jugular central line placement.   Electronically Signed   By: Sabino Dick M.D.   On: 02/05/2013 17:35   Dg Chest Port 1 View  02/05/2013   CLINICAL DATA:  Weakness, cough   EXAM: PORTABLE CHEST - 1 VIEW  COMPARISON:  DG ABD ACUTE W/CHEST dated 01/27/2013  FINDINGS: There are low lung volumes. There is crowding of the central pulmonary markings secondary to low lung volumes. There is no focal consolidation, pleural effusion or pneumothorax. Stable cardiomediastinal silhouette. Unremarkable osseous structures.  IMPRESSION: Low lung volumes with crowding of the central pulmonary markings. No active disease.   Electronically Signed   By: Kathreen Devoid   On: 02/05/2013 09:28   Dg Abd Acute W/chest  01/27/2013   CLINICAL DATA:  Left lower quadrant pain  EXAM: ACUTE ABDOMEN SERIES (ABDOMEN 2 VIEW & CHEST 1 VIEW)  COMPARISON:  08/16/2012  FINDINGS: Cardiomediastinal silhouette is stable. Dextroscoliosis thoracic spine again noted. No acute infiltrate or pulmonary edema.  Postcholecystectomy surgical clips are noted. There is nonspecific nonobstructive bowel gas pattern. No free abdominal air.  IMPRESSION: Negative abdominal radiographs.  No acute cardiopulmonary disease.   Electronically Signed   By: Lahoma Crocker M.D.   On: 01/27/2013 14:55   Dg Abd Portable 1v  02/07/2013   CLINICAL DATA:  Abdominal pain,  nausea and vomiting. 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  EXAM: PORTABLE ABDOMEN - 1 VIEW  COMPARISON:  CT on 02/02/2013  FINDINGS: Mildly prominent left-sided small bowel loops may be consistent with focal ileus. No colonic dilatation is identified.  IMPRESSION: Possible mild small bowel ileus.   Electronically Signed   By: Aletta Edouard M.D.   On: 02/07/2013 12:41    CBC  Recent Labs Lab 02/08/13 0500 02/09/13 0400 02/10/13 0400 02/11/13 0316 02/14/13 0550  WBC 12.8* 12.6* 11.7* 12.5* 13.0*  HGB 7.8* 8.8* 8.1* 8.5* 7.5*  HCT 22.8* 26.2* 24.6* 26.1* 23.0*  PLT 271 333 297 295 341  MCV 64.4* 63.9* 64.7* 65.4* 65.0*  MCH 22.0* 21.5* 21.3* 21.3* 21.2*  MCHC 34.2 33.6 32.9 32.6 32.6  RDW 18.1* 18.3* 18.1* 18.0* 18.5*  LYMPHSABS   --  1.5 1.2  --   --   MONOABS  --  1.9* 1.4*  --   --   EOSABS  --  0.0 0.0  --   --   BASOSABS  --  0.0 0.0  --   --     Chemistries   Recent Labs Lab 02/08/13 0500 02/09/13 0400 02/10/13 0400 02/11/13 0316  NA 147 149* 141 145  K 3.1* 2.6* 3.0* 3.7  CL 115* 116* 109 114*  CO2 20 19 19  18*  GLUCOSE 108* 92 101* 98  BUN 24* 23 18 16   CREATININE 1.46* 1.36* 1.13* 1.01  CALCIUM 9.0 9.3 9.0 9.0  MG  --   --  1.9  --   AST  --  15  --   --   ALT  --  14  --   --   ALKPHOS  --  124*  --   --   BILITOT  --  0.5  --   --    ------------------------------------------------------------------------------------------------------------------ estimated creatinine clearance is 66.7 ml/min (by C-G formula based on Cr of 1.01). ------------------------------------------------------------------------------------------------------------------ No results found for this basename: HGBA1C,  in the last 72 hours ------------------------------------------------------------------------------------------------------------------ No results found for this basename: CHOL, HDL, LDLCALC, TRIG, CHOLHDL, LDLDIRECT,  in the last 72 hours ------------------------------------------------------------------------------------------------------------------ No results found for this basename: TSH, T4TOTAL, FREET3, T3FREE, THYROIDAB,  in the last 72 hours ------------------------------------------------------------------------------------------------------------------ No results found for this basename: VITAMINB12, FOLATE, FERRITIN, TIBC, IRON, RETICCTPCT,  in the last 72 hours  Coagulation profile No results found for this basename: INR, PROTIME,  in the last 168 hours  No results found for this basename: DDIMER,  in the last 72 hours  Cardiac Enzymes No results found for this basename: CK, CKMB, TROPONINI, MYOGLOBIN,  in the last 168  hours ------------------------------------------------------------------------------------------------------------------ No components found with this basename: POCBNP,     Lyndsi Altic D.O. on 02/14/2013 at 8:18 AM  Between 7am to 7pm - Pager - [phone]  After 7pm go to [email or web address] - password [email or web address]  And look for the night coverage person covering for me after hours  [facility] Office  [phone]

## 2013-02-14 NOTE — Progress Notes (Signed)
RT Note: Pt not ready for CPAP at this time I told her to call when ready.

## 2013-02-15 ENCOUNTER — Telehealth: Payer: Self-pay | Admitting: Internal Medicine

## 2013-02-15 DIAGNOSIS — N179 Acute kidney failure, unspecified: Secondary | ICD-10-CM | POA: Diagnosis not present

## 2013-02-15 DIAGNOSIS — K56609 Unspecified intestinal obstruction, unspecified as to partial versus complete obstruction: Secondary | ICD-10-CM | POA: Diagnosis not present

## 2013-02-15 DIAGNOSIS — R109 Unspecified abdominal pain: Secondary | ICD-10-CM | POA: Diagnosis not present

## 2013-02-15 DIAGNOSIS — D509 Iron deficiency anemia, unspecified: Secondary | ICD-10-CM | POA: Diagnosis not present

## 2013-02-15 LAB — CBC
HCT: 23.3 % — ABNORMAL LOW (ref 36.0–46.0)
HEMOGLOBIN: 7.8 g/dL — AB (ref 12.0–15.0)
MCH: 21.8 pg — ABNORMAL LOW (ref 26.0–34.0)
MCHC: 33.5 g/dL (ref 30.0–36.0)
MCV: 65.1 fL — ABNORMAL LOW (ref 78.0–100.0)
Platelets: 332 10*3/uL (ref 150–400)
RBC: 3.58 MIL/uL — AB (ref 3.87–5.11)
RDW: 19 % — ABNORMAL HIGH (ref 11.5–15.5)
WBC: 12.6 10*3/uL — AB (ref 4.0–10.5)

## 2013-02-15 LAB — POTASSIUM: Potassium: 3.4 mEq/L — ABNORMAL LOW (ref 3.7–5.3)

## 2013-02-15 LAB — MAGNESIUM: MAGNESIUM: 1.7 mg/dL (ref 1.5–2.5)

## 2013-02-15 MED ORDER — POTASSIUM CHLORIDE CRYS ER 20 MEQ PO TBCR
40.0000 meq | EXTENDED_RELEASE_TABLET | Freq: Once | ORAL | Status: AC
  Administered 2013-02-15: 40 meq via ORAL
  Filled 2013-02-15: qty 2

## 2013-02-15 NOTE — Discharge Instructions (Signed)
[name], [place] [phone]  OPEN ABDOMINAL SURGERY: POST OP INSTRUCTIONS  Always review your discharge instruction sheet given to you by the facility where your surgery was performed.  IF YOU HAVE DISABILITY OR FAMILY LEAVE FORMS, YOU MUST BRING THEM TO THE OFFICE FOR PROCESSING.  PLEASE DO NOT GIVE THEM TO YOUR DOCTOR.  1. A prescription for pain medication may be given to you upon discharge.  Take your pain medication as prescribed, if needed.  If narcotic pain medicine is not needed, then you may take acetaminophen (Tylenol) or ibuprofen (Advil) as needed. 2. Take your usually prescribed medications unless otherwise directed. 3. If you need a refill on your pain medication, please contact your pharmacy. They will contact our office to request authorization.  Prescriptions will not be filled after 5pm or on week-ends. 4. You should follow a light diet the first few days after arrival home, such as soup and crackers, pudding, etc.unless your doctor has advised otherwise. A high-fiber, low fat diet can be resumed as tolerated.   Be sure to include lots of fluids daily. Most patients will experience some swelling and bruising on the chest and neck area.  Ice packs will help.  Swelling and bruising can take several days to resolve 5. Most patients will experience some swelling and bruising in the area of the incision. Ice pack will help. Swelling and bruising can take several days to resolve..  6. It is common to experience some constipation if taking pain medication after surgery.  Increasing fluid intake and taking a stool softener will usually help or prevent this problem from occurring.  A mild laxative (Milk of Magnesia or Miralax) should be taken according to package directions if there are no bowel movements after 48 hours. 7.  You may have steri-strips (small skin tapes) in place directly over the incision.  These strips should be left on the skin for 7-10 days.  If  your surgeon used skin glue on the incision, you may shower in 24 hours.  The glue will flake off over the next 2-3 weeks.  Any sutures or staples will be removed at the office during your follow-up visit. You may find that a light gauze bandage over your incision may keep your staples from being rubbed or pulled. You may shower and replace the bandage daily. 8. ACTIVITIES:  You may resume regular (light) daily activities beginning the next day--such as daily self-care, walking, climbing stairs--gradually increasing activities as tolerated.  You may have sexual intercourse when it is comfortable.  Refrain from any heavy lifting or straining until approved by your doctor. a. You may drive when you no longer are taking prescription pain medication, you can comfortably wear a seatbelt, and you can safely maneuver your car and apply brakes b. Return to Work: ___________________________________ 37. You should see your doctor in the office for a follow-up appointment approximately two weeks after your surgery.  Make sure that you call for this appointment within a day or two after you arrive home to insure a convenient appointment time. OTHER INSTRUCTIONS:  _____________________________________________________________ _____________________________________________________________  WHEN TO CALL YOUR DOCTOR: 1. Fever over 101.0 2. Inability to urinate 3. Nausea and/or vomiting 4. Extreme swelling or bruising 5. Continued bleeding from incision. 6. Increased pain, redness, or drainage from the incision. 7. Difficulty swallowing or breathing 8. Muscle cramping or spasms. 9. Numbness or tingling in hands or feet or around lips.  The clinic staff is available to  answer your questions during regular business hours.  Please dont hesitate to call and ask to speak to one of the nurses if you have concerns.  For further questions, please visit [email or web address]  Colostomy Home Guide A colostomy  is an opening for stool to leave your body when a medical condition prevents it from leaving through the usual opening (rectum). During a surgery, a piece of large intestine (colon) is brought through a hole in the abdominal wall. The new opening is called a stoma or ostomy. A bag or pouch fits over the stoma to catch stool and gas. Your stool may be liquid, somewhat pasty, or formed. CARING FOR YOUR STOMA  Normally, the stoma looks a lot like the inside of your cheek: pink, red, and moist. At first it may be swollen, but this swelling will decrease within 6 weeks. Keep the skin around your stoma clean and dry. You can gently wash your stoma and the skin around your stoma in the shower with a clean, soft washcloth. If you develop any skin irritation, your caregiver may give you a stoma powder or ointment to help heal the area. Do not use any products other than those specifically given to you by your caregiver.  Your stoma should not be uncomfortable. If you notice any stinging or burning, your pouch may be leaking, and the skin around your stoma may be coming into contact with stool. This can cause skin irritation. If you notice stinging, replace your pouch with a new one and discard the old one. OSTOMY POUCHES  The pouch that fits over the ostomy can be made up of either 1 or 2 pieces. A one-piece pouch has a skin barrier piece and the pouch itself in one unit. A two-piece pouch has a skin barrier with a separate pouch that snaps on and off of the skin barrier. Either way, you should empty the pouch when it is only  to  full. Do not let more stool or gas build up. This could cause the pouch to leak. Some ostomy bags have a built-in gas release valve. Ostomy deodorizer (5 drops) can be put into the pouch to prevent odor. Some people use ostomy lubricant drops inside the pouch to help the stool slide out of the bag more easily and completely.  EMPTYING YOUR OSTOMY POUCH  You may get lessons on how to empty  your pouch from a wound-ostomy nurse before you leave the hospital. Here are the basic steps:  Wash your hands with soap and water.  Sit far back on the toilet.  Put several pieces of toilet paper into the toilet water. This will prevent splashing as you empty the stool into the toilet bowl.  Unclip or unvelcro the tail end of the pouch.  Unroll the tail and empty stool into the toilet.  Clean the tail with toilet paper.  Reroll the tail, and clip or velcro it closed.  Wash your hands again. CHANGING YOUR OSTOMY POUCH  Change your ostomy pouch about every 3 to 4 days for the first 6 weeks, then every 5 to7 days. Always change the bag sooner if there is any leakage or you begin to notice any discomfort or irritation of the skin around the stoma. When possible, plan to change your ostomy pouch before eating or drinking as this will lessen the chance of stool coming out during the pouch change. A wound-ostomy nurse may teach you how to change your pouch before you leave the hospital. Here are  the basic steps:  Hoyle Barr out your supplies.  Wash your hands with soap and water.  Carefully remove the old pouch.  Wash the stoma and allow it to dry. Men may be advised to shave any hair around the stoma very carefully. This will make the adhesive stick better.  Use the stoma measuring guide that comes with your pouch set to decide what size hole you will need to cut in the skin barrier piece. Choose the smallest possible size that will hold the stoma but will not touch it.  Use the guide to trace the circle on the back of the skin barrier piece. Cut out the hole.  Hold the skin barrier piece over the stoma to make sure the hole is the correct size.  Remove the adhesive paper backing from the skin barrier piece.  Squeeze stoma paste around the opening of the skin barrier piece.  Clean and dry the skin around the stoma again.  Carefully fit the skin barrier piece over your stoma.  If you are  using a two-piece pouch, snap the pouch onto the skin barrier piece.  Close the tail of the pouch.  Put your hand over the top of the skin barrier piece to help warm it for about 5 minutes, so that it conforms to your body better.  Wash your hands again. DIET TIPS   Continue to follow your usual diet.  Drink about eight 8 oz glasses of water each day.  You can prevent gas by eating slowly and chewing your food thoroughly.  If you feel concerned that you have too much gas, you can cut back on gas-producing foods, such as:  Spicy foods.  Onions and garlic.  Cruciferous vegetables (cabbage, broccoli, cauliflower, Brussels sprouts).  Beans and legumes.  Some cheeses.  Eggs.  Fish.  Bubbly (carbonated) drinks.  Chewing gum. GENERAL TIPS   You can shower with or without the bag in place.  Always keep the bag on if you are bathing or swimming.  If your bag gets wet, you can dry it with a blow-dryer set to cool.  Avoid wearing tight clothing directly over your stoma so that it does not become irritated or bleed. Tight clothing can also prevent stool from draining into the pouch.  It is helpful to always have an extra skin barrier and pouch with you when traveling. Do not leave them anywhere too warm, as parts of them can melt.  Do not let your seat belt rest on your stoma. Try to keep the seat belt either above or below your stoma, or use a tiny pillow to cushion it.  You can still participate in sports, but you should avoid activities in which there is a risk of getting hit in the abdomen.  You can still have sex. It is a good idea to empty your pouch prior to sex. Some people and their partners feel very comfortable seeing the pouch during sex. Others choose to wear lingerie or a T-shirt that covers the device. SEEK IMMEDIATE MEDICAL CARE IF:  You notice a change in the size or color of the stoma, especially if it becomes very red, purple, black, or pale white.  You  have bloody stools or bleeding from the stoma.  You have abdominal pain, nausea, vomiting, or bloating.  There is anything unusual protruding from the stoma.  You have irritation or red skin around the stoma.  No stool is passing from the stoma.  You have diarrhea (requiring more frequent  than normal pouch emptying). Document Released: 01/17/2003 Document Revised: 04/08/2011 Document Reviewed: 06/13/2010 [facility] Patient Information 2014 [place], [place].

## 2013-02-15 NOTE — Consult Note (Signed)
WOC ostomy follow up Stoma type/location:  RLQ, end colostomy.   Stomal assessment/size:  Oval, pink, moist, retracted below skin level.  Peristomal assessment: intact Treatment options for stomal/peristomal skin: added 2" skin barrier ring to aid in seal of retracted stoma Output liquid and pasty yellow in pouch, mucous Ostomy pouching: 1pc.convex placed with 2" barrier ring per my partner last Thursday, this seems to be holding well. Will place her on a M/Th schedule for pouch changes. Education provided: Pt removed old pouch today, she has a difficult time seeing the stoma, therefore we did her pouch change with he sitting up in the chair. She could better visualize the site sitting up with towel used to hold her breast out of the way.   I demonstrated measurement of her stoma and cutting new wafer, placement of barrier ring today and placement of pouch. She is independent with lock and roll open and closure after my visit today. Encouraged her to empty pouch when needed.  Will benefit from [facility] at home to continue to support ostomy care.    Enrolled patient in [place] Start Discharge program: Yes  [facility] team will follow along with your for ostomy care and teaching  Kellyn Mccary [place] [phone]

## 2013-02-15 NOTE — Progress Notes (Signed)
11 Days Post-Op  Subjective: No complaints Tolerating po  Objective: Vital signs in last 24 hours: Temp:  [97.9 F (36.6 C)-99.1 F (37.3 C)] 98.5 F (36.9 C) (01/19 0514) Pulse Rate:  [53-62] 60 (01/19 0514) Resp:  [17-18] 17 (01/19 0514) BP: (105-125)/(53-61) 105/53 mmHg (01/19 0514) SpO2:  [93 %-99 %] 93 % (01/19 0937) Weight:  [245 lb 11.2 oz (111.449 kg)] 245 lb 11.2 oz (111.449 kg) (01/19 0514) Last BM Date: 02/14/13  Intake/Output from previous day: 01/18 0701 - 01/19 0700 In: 700 [P.O.:700] Out: 1700 [Urine:1400; Stool:300] Intake/Output this shift: Total I/O In: -  Out: 350 [Urine:300; Stool:50]  Abdomen soft, incision clean Ostomy working well  Lab Results:   Recent Labs  02/14/13 0550 02/15/13 0550  WBC 13.0* 12.6*  HGB 7.5* 7.8*  HCT 23.0* 23.3*  PLT 341 332   BMET  Recent Labs  02/14/13 0550 02/15/13 0550  NA 145  --   K 3.4* 3.4*  CL 111  --   CO2 20  --   GLUCOSE 98  --   BUN 12  --   CREATININE 1.04  --   CALCIUM 8.5  --    PT/INR No results found for this basename: LABPROT, INR,  in the last 72 hours ABG No results found for this basename: PHART, PCO2, PO2, HCO3,  in the last 72 hours  Studies/Results: No results found.  Anti-infectives: Anti-infectives   Start     Dose/Rate Route Frequency Ordered Stop   02/07/13 1000  vancomycin (VANCOCIN) 1,500 mg in sodium chloride 0.9 % 500 mL IVPB  Status:  Discontinued     1,500 mg 250 mL/hr over 120 Minutes Intravenous Every 24 hours 02/07/13 0659 02/07/13 0911   02/06/13 1000  vancomycin (VANCOCIN) IVPB 1000 mg/200 mL premix  Status:  Discontinued     1,000 mg 200 mL/hr over 60 Minutes Intravenous Every 24 hours 02/05/13 0757 02/07/13 0659   02/05/13 0830  vancomycin (VANCOCIN) 2,000 mg in sodium chloride 0.9 % 500 mL IVPB     2,000 mg 250 mL/hr over 120 Minutes Intravenous  Once 02/05/13 0757 02/05/13 1100   02/05/13 0815  piperacillin-tazobactam (ZOSYN) IVPB 3.375 g  Status:   Discontinued     3.375 g 12.5 mL/hr over 240 Minutes Intravenous 3 times per day 02/05/13 0757 02/08/13 1016   02/04/13 1515  cefoTEtan (CEFOTAN) 1 g in dextrose 5 % 50 mL IVPB  Status:  Discontinued     1 g 100 mL/hr over 30 Minutes Intravenous Every 12 hours 02/04/13 1511 02/05/13 0851   02/04/13 1045  cefoTEtan (CEFOTAN) 2 g in dextrose 5 % 50 mL IVPB     2 g 100 mL/hr over 30 Minutes Intravenous On call to O.R. 02/04/13 1039 02/04/13 1030      Assessment/Plan: s/p Procedure(s): PARTIAL COLECTOMY (N/A) COLOSTOMY (Right)  D/c skin staples Awaiting SNF placement  LOS: 13 days    Maston Wight A 02/15/2013

## 2013-02-15 NOTE — Telephone Encounter (Signed)
[name] called me this morning. She has been in the hosp. Since 02/03/2013. She had a colonoscopy 02/03/13. [id number] she had to have emergency surgery. The surgeon had to remove part of her colon. She's not sure how much longer she's going to be in the hosp.Marland Kitchen She would like to cont. Her vac. While she's in there. Her last allergy shot was 01/29/13 1:10 at 0.5. Please advise.

## 2013-02-15 NOTE — Progress Notes (Signed)
[facility]                                                                              Patient Demographics  [name], is a 63 y.o. female, DOB - [birth year], ELF:[id number]  Admit date - 02/02/2013   Admitting Physician Cleora Fleet, MD  Outpatient Primary MD for the patient is Sonda Primes, MD  LOS - 13   Chief Complaint  Patient presents with  . Abdominal Pain        Assessment & Plan   Colon cancer s/p Lt hemicolectomy, end colostomy and Hartman's pouch  -Ongoing care as per Gen Surgery  -Staples will need to be removed today -Encourage incentive spirometry, out of bed  Hypokalemia  -Mg 1.9 -K3.4, will replace and continue to monitor  Acute kidney injury  -likely secondary to hypovolemia -Improved and resolved, Cr 1.04  Anemia of chronic disease  -Stable, H/h 7.5/23 -Will continue to monitor  Relative adrenal insufficiency  -cortisol 16.6 1/10  -slow steroid taper ongoing   Hx of OSA  -Does not appear to use home CPAP  -outpt study once stabilized would be indicated   NON AG acidosis  -from saline - resolved   Chronic back pain  -on disability, continue pain control   Obesity - Body mass index is 45.15 kg/(m^2).  Code Status: Full  Family Communication: None at bedside  Disposition Plan: Admitted.  Discharge still stands, however, pending PASARR and under review.    Time Spent in minutes   20 minutes  Procedures/Events 1/8 Left hemicolectomy with mobilization of the splenic flexure, creation of end colostomy and Hartman's pouch  1/9 Hypotension, PCCM consulted, transfer to ICU  1/10 Off pressors  1/11- Nausea relived by NGT placement  1/11 TTE - EF 60% to 65% - grade 1 diastolic dysfunction - PASP bordeline elevated. PA peak pressure: 27mm Hg  Consults   Gastroenterology General surgery PCCM  DVT Prophylaxis  Lovenox   Lab Results  Component Value Date   PLT 332 02/15/2013    Medications  Scheduled Meds: .  arformoterol  15 mcg Nebulization BID  . budesonide (PULMICORT) nebulizer solution  0.5 mg Nebulization BID  . enoxaparin (LOVENOX) injection  40 mg Subcutaneous Q24H  . feeding supplement (RESOURCE BREEZE)  1 Container Oral BID BM  . hydrocortisone sod succinate (SOLU-CORTEF) inj  25 mg Intravenous Q12H  . montelukast  10 mg Oral QHS  . pantoprazole  40 mg Oral QHS  . pneumococcal 23 valent vaccine  0.5 mL Intramuscular Tomorrow-1000   Continuous Infusions: . sodium chloride 10 mL/hr (02/11/13 2100)   PRN Meds:.albuterol, alum & mag hydroxide-simeth, fentaNYL, fluticasone, HYDROcodone-acetaminophen, ondansetron (ZOFRAN) IV, promethazine  Antibiotics    Anti-infectives   Start     Dose/Rate Route Frequency Ordered Stop   02/07/13 1000  vancomycin (VANCOCIN) 1,500 mg in sodium chloride 0.9 % 500 mL IVPB  Status:  Discontinued     1,500 mg 250 mL/hr over 120 Minutes Intravenous Every 24 hours 02/07/13 0659 02/07/13 0911   02/06/13 1000  vancomycin (VANCOCIN) IVPB 1000 mg/200 mL premix  Status:  Discontinued     1,000 mg 200 mL/hr over 60 Minutes Intravenous  Every 24 hours 02/05/13 0757 02/07/13 0659   02/05/13 0830  vancomycin (VANCOCIN) 2,000 mg in sodium chloride 0.9 % 500 mL IVPB     2,000 mg 250 mL/hr over 120 Minutes Intravenous  Once 02/05/13 0757 02/05/13 1100   02/05/13 0815  piperacillin-tazobactam (ZOSYN) IVPB 3.375 g  Status:  Discontinued     3.375 g 12.5 mL/hr over 240 Minutes Intravenous 3 times per day 02/05/13 0757 02/08/13 1016   02/04/13 1515  cefoTEtan (CEFOTAN) 1 g in dextrose 5 % 50 mL IVPB  Status:  Discontinued     1 g 100 mL/hr over 30 Minutes Intravenous Every 12 hours 02/04/13 1511 02/05/13 0851   02/04/13 1045  cefoTEtan (CEFOTAN) 2 g in dextrose 5 % 50 mL IVPB     2 g 100 mL/hr over 30 Minutes Intravenous On call to O.R. 02/04/13 1039 02/04/13 1030        Subjective:   [name] seen and examined today.  Patient has no complaints today.  She  denies pain, chest pain, SOB.  Objective:   Filed Vitals:   02/14/13 2103 02/15/13 0000 02/15/13 0514 02/15/13 0937  BP: 108/55  105/53   Pulse: 60 62 60   Temp: 99.1 F (37.3 C)  98.5 F (36.9 C)   TempSrc: Oral     Resp: 18 18 17    Height:      Weight:   111.449 kg (245 lb 11.2 oz)   SpO2: 99%  99% 93%    Wt Readings from Last 3 Encounters:  02/15/13 111.449 kg (245 lb 11.2 oz)  02/15/13 111.449 kg (245 lb 11.2 oz)  02/15/13 111.449 kg (245 lb 11.2 oz)     Intake/Output Summary (Last 24 hours) at 02/15/13 1038 Last data filed at 02/15/13 0845  Gross per 24 hour  Intake    460 ml  Output   1600 ml  Net  -1140 ml    Exam General: Well developed, well nourished, NAD, appears stated age  HEENT: NCAT, PERRLA, EOMI, Anicteic Sclera, mucous membranes moist. Neck: Supple, no JVD, no masses  Cardiovascular: S1 S2 auscultated, no rubs, murmurs or gallops. Regular rate and rhythm.  Respiratory: Clear to auscultation bilaterally with equal chest rise  Abdomen: Soft, obese, nontender, nondistended, + bowel sounds, Ostomy pink, midline incision clean, staples in place  Extremities: warm dry without cyanosis clubbing. +2 edema in LE B/L  Neuro: AAOx3, cranial nerves grossly intact.  Skin: Without rashes exudates or nodules  Psych: Normal affect and demeanor with intact judgement and insight  Data Review   Micro Results Recent Results (from the past 240 hour(s))  CLOSTRIDIUM DIFFICILE BY PCR     Status: None   Collection Time    02/07/13 10:55 PM      Result Value Range Status   C difficile by pcr NEGATIVE  NEGATIVE Final    Radiology Reports Ct Abdomen Pelvis W Contrast  02/02/2013   CLINICAL DATA:  63 year old female with abdominal and lower pelvic pain. Vomiting, chills, nausea. Initial encounter.  EXAM: CT ABDOMEN AND PELVIS WITH CONTRAST  TECHNIQUE: Multidetector CT imaging of the abdomen and pelvis was performed using the standard protocol following bolus  administration of intravenous contrast.  CONTRAST:  186mL OMNIPAQUE IOHEXOL 300 MG/ML  SOLN  COMPARISON:  [facility] DG ABD ACUTE W/CHEST dated 01/27/2013  FINDINGS: Negative lung bases.  No pericardial or pleural effusion.  No acute osseous abnormality identified. Degenerative changes in the lower lumbar spine including mild spondylolisthesis.  Chronic posterior element fusion or ankylosis at the lowest lumbar levels.  Small volume of pelvic free fluid in the central lower pelvis. Uterus surgically absent. At adnexa surgically absent. Decompressed bladder and rectum.  Redundant but decompressed sigmoid colon. The left colon is decompressed. At the splenic flexure there is abrupt tapering of the colon and indistinctness of the colonic wall and lumen (series 2, images 31 -38 and coronal image 82). There are also small but conspicuous adjacent mesenteric lymph nodes individually measuring up to 8 mm short axis (series 2). The margins of this obstructing lesion are indistinct, but estimated size is 3-4 cm (perhaps as large as 6 cm longitudinally).  The upstream colon then is moderately to severely dilated with gas and fluid (dilated redundant transverse colon up to 65 mm diameter, dilated ascending colon and cecum, the latter up to 81 mm diameter. Mildly prominent distal small bowel. No significantly dilated small bowel. Oral contrast in the stomach and proximal small bowel.  No liver lesion. The gallbladder is surgically absent with associated prominence of the intra and extrahepatic biliary tree. Negative spleen, pancreas and adrenal glands. Negative kidneys. Portal venous system is patent. Major arterial structures in the abdomen and pelvis are patent with no atherosclerosis identified. No abdominal free fluid.  IMPRESSION: 1. 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 Rough  estimation of tumor size 3-4 cm. Colonoscopy likely would be the most appropriate next step in evaluation. 2. Obstructed, dilated proximal colon.  Decompressed distal colon. 3. No liver mass or distant metastatic disease identified.   Electronically Signed   By: Lars Pinks M.D.   On: 02/02/2013 09:23   Dg Chest Port 1 View  02/09/2013   CLINICAL DATA:  Post colostomy  EXAM: PORTABLE CHEST - 1 VIEW  COMPARISON:  02/07/2013; 02/05/2013; 08/24/2012  FINDINGS: Grossly unchanged enlarged cardiac silhouette and mediastinal contours. Interval placement of enteric tube with tip and side port projecting below the left hemidiaphragm. Otherwise, stable positioning of remaining support apparatus. No pneumothorax. Overall improved aeration of the lungs with persistent perihilar and bilateral medial basilar opacities. Mild pulmonary venous congestion without frank evidence of edema. Trace bilateral effusions are not excluded. Grossly unchanged bones.  IMPRESSION: 1. Enteric tube tip and side port projects below the left hemidiaphragm. Otherwise, stable position of remaining support apparatus. 2. Improved aeration of the lungs with persistent perihilar and medial basilar opacities, likely atelectasis. 3. Pulmonary venous congestion without frank evidence of edema.   Electronically Signed   By: Sandi Mariscal M.D.   On: 02/09/2013 07:46   Dg Chest Port 1 View  02/07/2013   CLINICAL DATA:  Atelectasis and status post partial colectomy.  EXAM: PORTABLE CHEST - 1 VIEW  COMPARISON:  02/05/2013  FINDINGS: Stable low lung volumes with bibasilar atelectasis. No overt edema, consolidation or pleural fluid is seen. The heart size is stable. There is stable positioning of a left jugular central line in the lower SVC.  IMPRESSION: Stable low volumes with bibasilar atelectasis.   Electronically Signed   By: Aletta Edouard M.D.   On: 02/07/2013 08:50   Dg Chest Port 1 View  02/05/2013   CLINICAL DATA:  Line placement.  EXAM: PORTABLE CHEST  - 1 VIEW  COMPARISON:  Same day.  FINDINGS: Stable cardiomediastinal silhouette. Interval placement of left internal jugular catheter line with distal tip in expected position of the cavoatrial junction. No pneumothorax is  noted. No pleural effusion is noted. No acute pulmonary disease is noted.  IMPRESSION: No pneumothorax seen status post left internal jugular central line placement.   Electronically Signed   By: Sabino Dick M.D.   On: 02/05/2013 17:35   Dg Chest Port 1 View  02/05/2013   CLINICAL DATA:  Weakness, cough  EXAM: PORTABLE CHEST - 1 VIEW  COMPARISON:  DG ABD ACUTE W/CHEST dated 01/27/2013  FINDINGS: There are low lung volumes. There is crowding of the central pulmonary markings secondary to low lung volumes. There is no focal consolidation, pleural effusion or pneumothorax. Stable cardiomediastinal silhouette. Unremarkable osseous structures.  IMPRESSION: Low lung volumes with crowding of the central pulmonary markings. No active disease.   Electronically Signed   By: Kathreen Devoid   On: 02/05/2013 09:28   Dg Abd Acute W/chest  01/27/2013   CLINICAL DATA:  Left lower quadrant pain  EXAM: ACUTE ABDOMEN SERIES (ABDOMEN 2 VIEW & CHEST 1 VIEW)  COMPARISON:  08/16/2012  FINDINGS: Cardiomediastinal silhouette is stable. Dextroscoliosis thoracic spine again noted. No acute infiltrate or pulmonary edema.  Postcholecystectomy surgical clips are noted. There is nonspecific nonobstructive bowel gas pattern. No free abdominal air.  IMPRESSION: Negative abdominal radiographs.  No acute cardiopulmonary disease.   Electronically Signed   By: Lahoma Crocker M.D.   On: 01/27/2013 14:55   Dg Abd Portable 1v  02/07/2013   CLINICAL DATA:  Abdominal pain, nausea and vomiting. 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  EXAM: PORTABLE ABDOMEN - 1 VIEW  COMPARISON:  CT on 02/02/2013  FINDINGS: Mildly prominent left-sided small bowel loops may be consistent with focal ileus. No colonic  dilatation is identified.  IMPRESSION: Possible mild small bowel ileus.   Electronically Signed   By: Aletta Edouard M.D.   On: 02/07/2013 12:41    CBC  Recent Labs Lab 02/09/13 0400 02/10/13 0400 02/11/13 0316 02/14/13 0550 02/15/13 0550  WBC 12.6* 11.7* 12.5* 13.0* 12.6*  HGB 8.8* 8.1* 8.5* 7.5* 7.8*  HCT 26.2* 24.6* 26.1* 23.0* 23.3*  PLT 333 297 295 341 332  MCV 63.9* 64.7* 65.4* 65.0* 65.1*  MCH 21.5* 21.3* 21.3* 21.2* 21.8*  MCHC 33.6 32.9 32.6 32.6 33.5  RDW 18.3* 18.1* 18.0* 18.5* 19.0*  LYMPHSABS 1.5 1.2  --   --   --   MONOABS 1.9* 1.4*  --   --   --   EOSABS 0.0 0.0  --   --   --   BASOSABS 0.0 0.0  --   --   --     Chemistries   Recent Labs Lab 02/09/13 0400 02/10/13 0400 02/11/13 0316 02/14/13 0550 02/15/13 0550  NA 149* 141 145 145  --   K 2.6* 3.0* 3.7 3.4* 3.4*  CL 116* 109 114* 111  --   CO2 19 19 18* 20  --   GLUCOSE 92 101* 98 98  --   BUN 23 18 16 12   --   CREATININE 1.36* 1.13* 1.01 1.04  --   CALCIUM 9.3 9.0 9.0 8.5  --   MG  --  1.9  --   --   --   AST 15  --   --   --   --   ALT 14  --   --   --   --   ALKPHOS 124*  --   --   --   --   BILITOT 0.5  --   --   --   --    ------------------------------------------------------------------------------------------------------------------  estimated creatinine clearance is 65.2 ml/min (by C-G formula based on Cr of 1.04). ------------------------------------------------------------------------------------------------------------------ No results found for this basename: HGBA1C,  in the last 72 hours ------------------------------------------------------------------------------------------------------------------ No results found for this basename: CHOL, HDL, LDLCALC, TRIG, CHOLHDL, LDLDIRECT,  in the last 72 hours ------------------------------------------------------------------------------------------------------------------ No results found for this basename: TSH, T4TOTAL, FREET3, T3FREE,  THYROIDAB,  in the last 72 hours ------------------------------------------------------------------------------------------------------------------ No results found for this basename: VITAMINB12, FOLATE, FERRITIN, TIBC, IRON, RETICCTPCT,  in the last 72 hours  Coagulation profile No results found for this basename: INR, PROTIME,  in the last 168 hours  No results found for this basename: DDIMER,  in the last 72 hours  Cardiac Enzymes No results found for this basename: CK, CKMB, TROPONINI, MYOGLOBIN,  in the last 168 hours ------------------------------------------------------------------------------------------------------------------ No components found with this basename: POCBNP,     Lovell Roe D.O. on 02/15/2013 at 10:38 AM  Between 7am to 7pm - Pager - [phone]  After 7pm go to [email or web address] - password [email or web address]  And look for the night coverage person covering for me after hours  [facility] Office  [phone]

## 2013-02-15 NOTE — Progress Notes (Signed)
Physical Therapy Treatment Patient Details Name: JULEE STOLL MRN: [id number] DOB: Oct 28, 1950 Today's Date: 02/15/2013 Time: 7741-2878 PT Time Calculation (min): 26 min  PT Assessment / Plan / Recommendation  History of Present Illness This is a 63 year old female who presented with progressively worsening left lower quadrant abdominal pain as well as abdominal distention. She has lost weight recently. A CT scan showed a large mass at the splenic flexure of her colon with proximal obstruction. She was unable to undergo a bowel prep. She had a flexible sigmoidoscopy which showed an obstructing mass at her splenic flexure. She presents now for surgical resection.   PT Comments   Patient very motivated to walk this morning. Still continues to required increased time for all activities. Patient states husband cannot assist at home. Continue to recommend SNF for ongoing Physical Therapy.     Follow Up Recommendations  SNF     Does the patient have the potential to tolerate intense rehabilitation     Barriers to Discharge        Equipment Recommendations       Recommendations for Other Services    Frequency Min 3X/week   Progress towards PT Goals Progress towards PT goals: Progressing toward goals  Plan Current plan remains appropriate    Precautions / Restrictions Precautions Precautions: None   Pertinent Vitals/Pain no apparent distress     Mobility  Transfers Overall transfer level: Needs assistance Sit to Stand: Min guard General transfer comment: Minguard for safety. Cues for technique Ambulation/Gait Ambulation/Gait assistance: Min guard Ambulation Distance (Feet): 300 Feet Assistive device: Rolling walker (2 wheeled) Gait Pattern/deviations: Wide base of support;Step-through pattern;Decreased stride length Gait velocity: decreased Gait velocity interpretation: Below normal speed for age/gender    Exercises     PT Diagnosis:    PT Problem List:   PT Treatment  Interventions:     PT Goals (current goals can now be found in the care plan section)    Visit Information  Last PT Received On: 02/15/13 Assistance Needed: +1 History of Present Illness: This is a 63 year old female who presented with progressively worsening left lower quadrant abdominal pain as well as abdominal distention. She has lost weight recently. A CT scan showed a large mass at the splenic flexure of her colon with proximal obstruction. She was unable to undergo a bowel prep. She had a flexible sigmoidoscopy which showed an obstructing mass at her splenic flexure. She presents now for surgical resection.    Subjective Data      Cognition  Cognition Arousal/Alertness: Awake/alert Behavior During Therapy: WFL for tasks assessed/performed Overall Cognitive Status: Within Functional Limits for tasks assessed    Balance     End of Session PT - End of Session Activity Tolerance: Patient tolerated treatment well Patient left: in bed;with call bell/phone within reach Nurse Communication: Mobility status   GP     Jacqualyn Posey 02/15/2013, 11:23 AM 02/15/2013 Jacqualyn Posey PTA [phone] pager [phone] office

## 2013-02-15 NOTE — Progress Notes (Signed)
Pt states she will place CPAP herself prior to sleeping.  CPAP setup and humidification chamber filled prior to leaving pt room.  CPAP ready for patient use.

## 2013-02-15 NOTE — Progress Notes (Signed)
CSW contacted [facility] and they are also closed and CSW will not be able to obtain a PASRR until normal business hours resume tomorrow.    [facility]  [address];  [phone] Phone: [phone]

## 2013-02-15 NOTE — Progress Notes (Signed)
CSW attempted to contact representative from [place] Murriel Hopper at [phone]) concerning PASRR.  Representative is not in the office today and CSW will contact [facility] [facility] to see if a PASRR can be obtained today as it is a [place].    [facility]  [address];  [phone] Phone: [phone]

## 2013-02-16 ENCOUNTER — Encounter (HOSPITAL_COMMUNITY): Payer: Self-pay

## 2013-02-16 ENCOUNTER — Telehealth: Payer: Self-pay | Admitting: *Deleted

## 2013-02-16 DIAGNOSIS — D509 Iron deficiency anemia, unspecified: Secondary | ICD-10-CM | POA: Diagnosis not present

## 2013-02-16 DIAGNOSIS — N179 Acute kidney failure, unspecified: Secondary | ICD-10-CM | POA: Diagnosis not present

## 2013-02-16 DIAGNOSIS — R109 Unspecified abdominal pain: Secondary | ICD-10-CM | POA: Diagnosis not present

## 2013-02-16 DIAGNOSIS — K56609 Unspecified intestinal obstruction, unspecified as to partial versus complete obstruction: Secondary | ICD-10-CM | POA: Diagnosis not present

## 2013-02-16 LAB — POTASSIUM: Potassium: 3.7 mEq/L (ref 3.7–5.3)

## 2013-02-16 MED ORDER — MAGNESIUM SULFATE IN D5W 10-5 MG/ML-% IV SOLN
1.0000 g | Freq: Once | INTRAVENOUS | Status: AC
  Administered 2013-02-16: 1 g via INTRAVENOUS
  Filled 2013-02-16: qty 100

## 2013-02-16 NOTE — Telephone Encounter (Signed)
Received Microsatellite Instability results on splenic flexure tumor tissue [id number]: Microsatellite Instability High. Results to Dr. Gearldine Shown desk for review.

## 2013-02-16 NOTE — Progress Notes (Signed)
[facility] [facility] is still stating they did not receive information, however information was sent. CSW sent information again and will contact hem back shortly for disposition for d/c.    CSW will continue to follow Pt for d/c planning.    [facility]  [address];  [phone] Phone: [phone]

## 2013-02-16 NOTE — Progress Notes (Addendum)
[facility]                                                                              Patient Demographics  [name], is a 63 y.o. female, DOB - 06/10/1950, FO:[id number]  Admit date - 02/02/2013   Admitting Physician Murlean Iba, MD  Outpatient Primary MD for the patient is Walker Kehr, MD  LOS - 40   Chief Complaint  Patient presents with  . Abdominal Pain      Interim History 63 year old female found to have colon cancer status post left humeral colectomy. Currently he discharge since  02/12/2013, however pending PASSR number.  Assessment & Plan   Colon cancer s/p Lt hemicolectomy, end colostomy and Hartman's pouch  -Ongoing care as per Gen Surgery  -Staples will need to be removed 02/15/13 -Encourage incentive spirometry, out of bed  Hypokalemia  -Mg 1.7 -K 3.7, will continue to monitor  Acute kidney injury  -likely secondary to hypovolemia -Improved and resolved, Cr 1.04  Anemia of chronic disease  -Stable, H/h 7.8/23.3 -Will continue to monitor  Relative adrenal insufficiency  -cortisol 16.6 1/10  -slow steroid taper ongoing   Hx of OSA  -Does not appear to use home CPAP  -outpt study once stabilized would be indicated   NON AG acidosis  -from saline - resolved   Chronic back pain  -on disability, continue pain control   Obesity - Body mass index is 45.15 kg/(m^2).  Code Status: Full  Family Communication: None at bedside  Disposition Plan: Admitted.  Discharge still stands, however, pending PASARR and under review.    Time Spent in minutes   20 minutes  Procedures/Events 1/8 Left hemicolectomy with mobilization of the splenic flexure, creation of end colostomy and Hartman's pouch  1/9 Hypotension, PCCM consulted, transfer to ICU  1/10 Off pressors  1/11- Nausea relived by NGT placement  1/11 TTE - EF 60% to 65% - grade 1 diastolic dysfunction - PASP bordeline elevated. PA peak pressure: 14mm Hg 119 Staples  removed  Consults   Gastroenterology General surgery PCCM  DVT Prophylaxis  Lovenox   Lab Results  Component Value Date   PLT 332 02/15/2013    Medications  Scheduled Meds: . arformoterol  15 mcg Nebulization BID  . budesonide (PULMICORT) nebulizer solution  0.5 mg Nebulization BID  . enoxaparin (LOVENOX) injection  40 mg Subcutaneous Q24H  . feeding supplement (RESOURCE BREEZE)  1 Container Oral BID BM  . hydrocortisone sod succinate (SOLU-CORTEF) inj  25 mg Intravenous Q12H  . montelukast  10 mg Oral QHS  . pantoprazole  40 mg Oral QHS  . pneumococcal 23 valent vaccine  0.5 mL Intramuscular Tomorrow-1000   Continuous Infusions: . sodium chloride 10 mL/hr (02/11/13 2100)   PRN Meds:.albuterol, alum & mag hydroxide-simeth, fentaNYL, fluticasone, HYDROcodone-acetaminophen, ondansetron (ZOFRAN) IV, promethazine  Antibiotics    Anti-infectives   Start     Dose/Rate Route Frequency Ordered Stop   02/07/13 1000  vancomycin (VANCOCIN) 1,500 mg in sodium chloride 0.9 % 500 mL IVPB  Status:  Discontinued     1,500 mg 250 mL/hr over 120 Minutes Intravenous Every 24 hours 02/07/13 0659 02/07/13 0911   02/06/13  1000  vancomycin (VANCOCIN) IVPB 1000 mg/200 mL premix  Status:  Discontinued     1,000 mg 200 mL/hr over 60 Minutes Intravenous Every 24 hours 02/05/13 0757 02/07/13 0659   02/05/13 0830  vancomycin (VANCOCIN) 2,000 mg in sodium chloride 0.9 % 500 mL IVPB     2,000 mg 250 mL/hr over 120 Minutes Intravenous  Once 02/05/13 0757 02/05/13 1100   02/05/13 0815  piperacillin-tazobactam (ZOSYN) IVPB 3.375 g  Status:  Discontinued     3.375 g 12.5 mL/hr over 240 Minutes Intravenous 3 times per day 02/05/13 0757 02/08/13 1016   02/04/13 1515  cefoTEtan (CEFOTAN) 1 g in dextrose 5 % 50 mL IVPB  Status:  Discontinued     1 g 100 mL/hr over 30 Minutes Intravenous Every 12 hours 02/04/13 1511 02/05/13 0851   02/04/13 1045  cefoTEtan (CEFOTAN) 2 g in dextrose 5 % 50 mL IVPB     2  g 100 mL/hr over 30 Minutes Intravenous On call to O.R. 02/04/13 1039 02/04/13 1030        Subjective:   [name] seen and examined today.  Patient has no complaints today.  She denies pain, chest pain, SOB.  Objective:   Filed Vitals:   02/15/13 2205 02/16/13 0300 02/16/13 0646 02/16/13 0742  BP:   100/43   Pulse:   56   Temp:   98 F (36.7 C)   TempSrc:   Oral   Resp:   20   Height:      Weight:  110.768 kg (244 lb 3.2 oz)    SpO2: 96%  95% 92%    Wt Readings from Last 3 Encounters:  02/16/13 110.768 kg (244 lb 3.2 oz)  02/16/13 110.768 kg (244 lb 3.2 oz)  02/16/13 110.768 kg (244 lb 3.2 oz)     Intake/Output Summary (Last 24 hours) at 02/16/13 1014 Last data filed at 02/16/13 3614  Gross per 24 hour  Intake      0 ml  Output   2250 ml  Net  -2250 ml    Exam General: Well developed, well nourished, NAD, appears stated age  HEENT: NCAT, PERRLA, EOMI, Anicteic Sclera, mucous membranes moist. Neck: Supple, no JVD, no masses  Cardiovascular: S1 S2 auscultated, no rubs, murmurs or gallops. Regular rate and rhythm.  Respiratory: Clear to auscultation bilaterally with equal chest rise  Abdomen: Soft, obese, nontender, nondistended, + bowel sounds, Ostomy pink, midline incision clean, staples in place  Extremities: warm dry without cyanosis clubbing. + edema in LE B/L  Neuro: AAOx3, cranial nerves grossly intact.  Skin: Without rashes exudates or nodules  Psych: Normal affect and demeanor with intact judgement and insight  Data Review   Micro Results Recent Results (from the past 240 hour(s))  CLOSTRIDIUM DIFFICILE BY PCR     Status: None   Collection Time    02/07/13 10:55 PM      Result Value Range Status   C difficile by pcr NEGATIVE  NEGATIVE Final    Radiology Reports Ct Abdomen Pelvis W Contrast  02/02/2013   CLINICAL DATA:  63 year old female with abdominal and lower pelvic pain. Vomiting, chills, nausea. Initial encounter.  EXAM: CT ABDOMEN  AND PELVIS WITH CONTRAST  TECHNIQUE: Multidetector CT imaging of the abdomen and pelvis was performed using the standard protocol following bolus administration of intravenous contrast.  CONTRAST:  126mL OMNIPAQUE IOHEXOL 300 MG/ML  SOLN  COMPARISON:  [facility] W/CHEST dated 01/27/2013  FINDINGS: Negative  lung bases.  No pericardial or pleural effusion.  No acute osseous abnormality identified. Degenerative changes in the lower lumbar spine including mild spondylolisthesis. Chronic posterior element fusion or ankylosis at the lowest lumbar levels.  Small volume of pelvic free fluid in the central lower pelvis. Uterus surgically absent. At adnexa surgically absent. Decompressed bladder and rectum.  Redundant but decompressed sigmoid colon. The left colon is decompressed. At the splenic flexure there is abrupt tapering of the colon and indistinctness of the colonic wall and lumen (series 2, images 31 -38 and coronal image 82). There are also small but conspicuous adjacent mesenteric lymph nodes individually measuring up to 8 mm short axis (series 2). The margins of this obstructing lesion are indistinct, but estimated size is 3-4 cm (perhaps as large as 6 cm longitudinally).  The upstream colon then is moderately to severely dilated with gas and fluid (dilated redundant transverse colon up to 65 mm diameter, dilated ascending colon and cecum, the latter up to 81 mm diameter. Mildly prominent distal small bowel. No significantly dilated small bowel. Oral contrast in the stomach and proximal small bowel.  No liver lesion. The gallbladder is surgically absent with associated prominence of the intra and extrahepatic biliary tree. Negative spleen, pancreas and adrenal glands. Negative kidneys. Portal venous system is patent. Major arterial structures in the abdomen and pelvis are patent with no atherosclerosis identified. No abdominal free fluid.  IMPRESSION: 1. 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 Rough estimation of tumor size 3-4 cm. Colonoscopy likely would be the most appropriate next step in evaluation. 2. Obstructed, dilated proximal colon.  Decompressed distal colon. 3. No liver mass or distant metastatic disease identified.   Electronically Signed   By: Lars Pinks M.D.   On: 02/02/2013 09:23   Dg Chest Port 1 View  02/09/2013   CLINICAL DATA:  Post colostomy  EXAM: PORTABLE CHEST - 1 VIEW  COMPARISON:  02/07/2013; 02/05/2013; 08/24/2012  FINDINGS: Grossly unchanged enlarged cardiac silhouette and mediastinal contours. Interval placement of enteric tube with tip and side port projecting below the left hemidiaphragm. Otherwise, stable positioning of remaining support apparatus. No pneumothorax. Overall improved aeration of the lungs with persistent perihilar and bilateral medial basilar opacities. Mild pulmonary venous congestion without frank evidence of edema. Trace bilateral effusions are not excluded. Grossly unchanged bones.  IMPRESSION: 1. Enteric tube tip and side port projects below the left hemidiaphragm. Otherwise, stable position of remaining support apparatus. 2. Improved aeration of the lungs with persistent perihilar and medial basilar opacities, likely atelectasis. 3. Pulmonary venous congestion without frank evidence of edema.   Electronically Signed   By: Sandi Mariscal M.D.   On: 02/09/2013 07:46   Dg Chest Port 1 View  02/07/2013   CLINICAL DATA:  Atelectasis and status post partial colectomy.  EXAM: PORTABLE CHEST - 1 VIEW  COMPARISON:  02/05/2013  FINDINGS: Stable low lung volumes with bibasilar atelectasis. No overt edema, consolidation or pleural fluid is seen. The heart size is stable. There is stable positioning of a left jugular central line in the lower SVC.  IMPRESSION: Stable low volumes with bibasilar atelectasis.   Electronically  Signed   By: Aletta Edouard M.D.   On: 02/07/2013 08:50   Dg Chest Port 1 View  02/05/2013   CLINICAL DATA:  Line placement.  EXAM: PORTABLE CHEST - 1 VIEW  COMPARISON:  Same day.  FINDINGS:  Stable cardiomediastinal silhouette. Interval placement of left internal jugular catheter line with distal tip in expected position of the cavoatrial junction. No pneumothorax is noted. No pleural effusion is noted. No acute pulmonary disease is noted.  IMPRESSION: No pneumothorax seen status post left internal jugular central line placement.   Electronically Signed   By: Sabino Dick M.D.   On: 02/05/2013 17:35   Dg Chest Port 1 View  02/05/2013   CLINICAL DATA:  Weakness, cough  EXAM: PORTABLE CHEST - 1 VIEW  COMPARISON:  DG ABD ACUTE W/CHEST dated 01/27/2013  FINDINGS: There are low lung volumes. There is crowding of the central pulmonary markings secondary to low lung volumes. There is no focal consolidation, pleural effusion or pneumothorax. Stable cardiomediastinal silhouette. Unremarkable osseous structures.  IMPRESSION: Low lung volumes with crowding of the central pulmonary markings. No active disease.   Electronically Signed   By: Kathreen Devoid   On: 02/05/2013 09:28   Dg Abd Acute W/chest  01/27/2013   CLINICAL DATA:  Left lower quadrant pain  EXAM: ACUTE ABDOMEN SERIES (ABDOMEN 2 VIEW & CHEST 1 VIEW)  COMPARISON:  08/16/2012  FINDINGS: Cardiomediastinal silhouette is stable. Dextroscoliosis thoracic spine again noted. No acute infiltrate or pulmonary edema.  Postcholecystectomy surgical clips are noted. There is nonspecific nonobstructive bowel gas pattern. No free abdominal air.  IMPRESSION: Negative abdominal radiographs.  No acute cardiopulmonary disease.   Electronically Signed   By: Lahoma Crocker M.D.   On: 01/27/2013 14:55   Dg Abd Portable 1v  02/07/2013   CLINICAL DATA:  Abdominal pain, nausea and vomiting. 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  EXAM: PORTABLE  ABDOMEN - 1 VIEW  COMPARISON:  CT on 02/02/2013  FINDINGS: Mildly prominent left-sided small bowel loops may be consistent with focal ileus. No colonic dilatation is identified.  IMPRESSION: Possible mild small bowel ileus.   Electronically Signed   By: Aletta Edouard M.D.   On: 02/07/2013 12:41    CBC  Recent Labs Lab 02/10/13 0400 02/11/13 0316 02/14/13 0550 02/15/13 0550  WBC 11.7* 12.5* 13.0* 12.6*  HGB 8.1* 8.5* 7.5* 7.8*  HCT 24.6* 26.1* 23.0* 23.3*  PLT 297 295 341 332  MCV 64.7* 65.4* 65.0* 65.1*  MCH 21.3* 21.3* 21.2* 21.8*  MCHC 32.9 32.6 32.6 33.5  RDW 18.1* 18.0* 18.5* 19.0*  LYMPHSABS 1.2  --   --   --   MONOABS 1.4*  --   --   --   EOSABS 0.0  --   --   --   BASOSABS 0.0  --   --   --     Chemistries   Recent Labs Lab 02/10/13 0400 02/11/13 0316 02/14/13 0550 02/15/13 0550 02/15/13 0650 02/16/13 0425  NA 141 145 145  --   --   --   K 3.0* 3.7 3.4* 3.4*  --  3.7  CL 109 114* 111  --   --   --   CO2 19 18* 20  --   --   --   GLUCOSE 101* 98 98  --   --   --   BUN 18 16 12   --   --   --   CREATININE 1.13* 1.01 1.04  --   --   --   CALCIUM 9.0 9.0 8.5  --   --   --   MG 1.9  --   --   --  1.7  --    ------------------------------------------------------------------------------------------------------------------ estimated creatinine  clearance is 65 ml/min (by C-G formula based on Cr of 1.04). ------------------------------------------------------------------------------------------------------------------ No results found for this basename: HGBA1C,  in the last 72 hours ------------------------------------------------------------------------------------------------------------------ No results found for this basename: CHOL, HDL, LDLCALC, TRIG, CHOLHDL, LDLDIRECT,  in the last 72 hours ------------------------------------------------------------------------------------------------------------------ No results found for this basename: TSH, T4TOTAL, FREET3,  T3FREE, THYROIDAB,  in the last 72 hours ------------------------------------------------------------------------------------------------------------------ No results found for this basename: VITAMINB12, FOLATE, FERRITIN, TIBC, IRON, RETICCTPCT,  in the last 72 hours  Coagulation profile No results found for this basename: INR, PROTIME,  in the last 168 hours  No results found for this basename: DDIMER,  in the last 72 hours  Cardiac Enzymes No results found for this basename: CK, CKMB, TROPONINI, MYOGLOBIN,  in the last 168 hours ------------------------------------------------------------------------------------------------------------------ No components found with this basename: POCBNP,     Gryffin Altice D.O. on 02/16/2013 at 10:14 AM  Between 7am to 7pm - Pager - [phone]  After 7pm go to [email or web address] - password [email or web address]  And look for the night coverage person covering for me after hours  [facility] Office  [phone]

## 2013-02-16 NOTE — Progress Notes (Signed)
12 Days Post-Op  Subjective: Tolerating po and working with PT  Objective: Vital signs in last 24 hours: Temp:  [98 F (36.7 C)-98.4 F (36.9 C)] 98 F (36.7 C) (01/20 0646) Pulse Rate:  [56-80] 56 (01/20 0646) Resp:  [19-20] 20 (01/20 0646) BP: (100-128)/(43-54) 100/43 mmHg (01/20 0646) SpO2:  [92 %-100 %] 92 % (01/20 0742) Weight:  [244 lb 3.2 oz (110.768 kg)] 244 lb 3.2 oz (110.768 kg) (01/20 0300) Last BM Date: 02/15/13  Intake/Output from previous day: 01/19 0701 - 01/20 0700 In: 360 [P.O.:360] Out: 2600 [Urine:2425; Stool:175] Intake/Output this shift:    Abdomen stable Ostomy stable, productive  Lab Results:   Recent Labs  02/14/13 0550 02/15/13 0550  WBC 13.0* 12.6*  HGB 7.5* 7.8*  HCT 23.0* 23.3*  PLT 341 332   BMET  Recent Labs  02/14/13 0550 02/15/13 0550 02/16/13 0425  NA 145  --   --   K 3.4* 3.4* 3.7  CL 111  --   --   CO2 20  --   --   GLUCOSE 98  --   --   BUN 12  --   --   CREATININE 1.04  --   --   CALCIUM 8.5  --   --    PT/INR No results found for this basename: LABPROT, INR,  in the last 72 hours ABG No results found for this basename: PHART, PCO2, PO2, HCO3,  in the last 72 hours  Studies/Results: No results found.  Anti-infectives: Anti-infectives   Start     Dose/Rate Route Frequency Ordered Stop   02/07/13 1000  vancomycin (VANCOCIN) 1,500 mg in sodium chloride 0.9 % 500 mL IVPB  Status:  Discontinued     1,500 mg 250 mL/hr over 120 Minutes Intravenous Every 24 hours 02/07/13 0659 02/07/13 0911   02/06/13 1000  vancomycin (VANCOCIN) IVPB 1000 mg/200 mL premix  Status:  Discontinued     1,000 mg 200 mL/hr over 60 Minutes Intravenous Every 24 hours 02/05/13 0757 02/07/13 0659   02/05/13 0830  vancomycin (VANCOCIN) 2,000 mg in sodium chloride 0.9 % 500 mL IVPB     2,000 mg 250 mL/hr over 120 Minutes Intravenous  Once 02/05/13 0757 02/05/13 1100   02/05/13 0815  piperacillin-tazobactam (ZOSYN) IVPB 3.375 g  Status:   Discontinued     3.375 g 12.5 mL/hr over 240 Minutes Intravenous 3 times per day 02/05/13 0757 02/08/13 1016   02/04/13 1515  cefoTEtan (CEFOTAN) 1 g in dextrose 5 % 50 mL IVPB  Status:  Discontinued     1 g 100 mL/hr over 30 Minutes Intravenous Every 12 hours 02/04/13 1511 02/05/13 0851   02/04/13 1045  cefoTEtan (CEFOTAN) 2 g in dextrose 5 % 50 mL IVPB     2 g 100 mL/hr over 30 Minutes Intravenous On call to O.R. 02/04/13 1039 02/04/13 1030      Assessment/Plan: s/p Procedure(s): PARTIAL COLECTOMY (N/A) COLOSTOMY (Right)  Continuing current care Awaiting placement  LOS: 14 days    Kallie Depolo A 02/16/2013

## 2013-02-16 NOTE — Progress Notes (Signed)
Pt asked if RT could just plug the CPAP up and move closer to her due to she would put it on when she was ready to go to sleep.  RT checked the water and moved the CPAP closer to the patient as asked.  Advised that RT would continue to monitor.

## 2013-02-16 NOTE — Telephone Encounter (Signed)
Dr.Young came in and told me yesterday afternoon to tell Mrs.Christofferson to just wait and we'll adjust her dosage accordingly when she is able to come back. I tried to reach pt. today at the hosp. or a family member at home with no success. I will let her know what Dr.Young advised when she calls back. She has my direct # and also I left it on her machine at home.

## 2013-02-17 DIAGNOSIS — H359 Unspecified retinal disorder: Secondary | ICD-10-CM | POA: Diagnosis not present

## 2013-02-17 DIAGNOSIS — Z5189 Encounter for other specified aftercare: Secondary | ICD-10-CM | POA: Diagnosis not present

## 2013-02-17 DIAGNOSIS — K219 Gastro-esophageal reflux disease without esophagitis: Secondary | ICD-10-CM | POA: Diagnosis not present

## 2013-02-17 DIAGNOSIS — C185 Malignant neoplasm of splenic flexure: Secondary | ICD-10-CM | POA: Diagnosis not present

## 2013-02-17 DIAGNOSIS — R5383 Other fatigue: Secondary | ICD-10-CM | POA: Diagnosis not present

## 2013-02-17 DIAGNOSIS — R634 Abnormal weight loss: Secondary | ICD-10-CM | POA: Diagnosis not present

## 2013-02-17 DIAGNOSIS — D649 Anemia, unspecified: Secondary | ICD-10-CM | POA: Diagnosis not present

## 2013-02-17 DIAGNOSIS — I10 Essential (primary) hypertension: Secondary | ICD-10-CM | POA: Diagnosis not present

## 2013-02-17 DIAGNOSIS — D509 Iron deficiency anemia, unspecified: Secondary | ICD-10-CM | POA: Diagnosis not present

## 2013-02-17 DIAGNOSIS — C7A023 Malignant carcinoid tumor of the transverse colon: Secondary | ICD-10-CM | POA: Diagnosis not present

## 2013-02-17 DIAGNOSIS — E876 Hypokalemia: Secondary | ICD-10-CM | POA: Diagnosis not present

## 2013-02-17 DIAGNOSIS — J309 Allergic rhinitis, unspecified: Secondary | ICD-10-CM | POA: Diagnosis not present

## 2013-02-17 DIAGNOSIS — C189 Malignant neoplasm of colon, unspecified: Secondary | ICD-10-CM | POA: Diagnosis not present

## 2013-02-17 DIAGNOSIS — D010 Carcinoma in situ of colon: Secondary | ICD-10-CM | POA: Diagnosis not present

## 2013-02-17 DIAGNOSIS — R5381 Other malaise: Secondary | ICD-10-CM | POA: Diagnosis not present

## 2013-02-17 DIAGNOSIS — J45909 Unspecified asthma, uncomplicated: Secondary | ICD-10-CM | POA: Diagnosis not present

## 2013-02-17 DIAGNOSIS — C801 Malignant (primary) neoplasm, unspecified: Secondary | ICD-10-CM | POA: Diagnosis not present

## 2013-02-17 DIAGNOSIS — C772 Secondary and unspecified malignant neoplasm of intra-abdominal lymph nodes: Secondary | ICD-10-CM | POA: Diagnosis not present

## 2013-02-17 LAB — CBC
HCT: 24.7 % — ABNORMAL LOW (ref 36.0–46.0)
Hemoglobin: 7.9 g/dL — ABNORMAL LOW (ref 12.0–15.0)
MCH: 21.3 pg — ABNORMAL LOW (ref 26.0–34.0)
MCHC: 32 g/dL (ref 30.0–36.0)
MCV: 66.6 fL — ABNORMAL LOW (ref 78.0–100.0)
PLATELETS: 335 10*3/uL (ref 150–400)
RBC: 3.71 MIL/uL — AB (ref 3.87–5.11)
RDW: 19 % — ABNORMAL HIGH (ref 11.5–15.5)
WBC: 11.5 10*3/uL — AB (ref 4.0–10.5)

## 2013-02-17 LAB — BASIC METABOLIC PANEL
BUN: 10 mg/dL (ref 6–23)
CALCIUM: 8.4 mg/dL (ref 8.4–10.5)
CO2: 21 mEq/L (ref 19–32)
Chloride: 111 mEq/L (ref 96–112)
Creatinine, Ser: 1.18 mg/dL — ABNORMAL HIGH (ref 0.50–1.10)
GFR, EST AFRICAN AMERICAN: 56 mL/min — AB (ref >90)
GFR, EST NON AFRICAN AMERICAN: 48 mL/min — AB (ref >90)
Glucose, Bld: 99 mg/dL (ref 70–99)
Potassium: 3.4 mEq/L — ABNORMAL LOW (ref 3.7–5.3)
SODIUM: 147 meq/L (ref 137–147)

## 2013-02-17 MED ORDER — HYDROCORTISONE 20 MG PO TABS: 20.0000 mg | ORAL_TABLET | Freq: Every day | ORAL | Status: DC

## 2013-02-17 MED ORDER — HYDROCODONE-ACETAMINOPHEN 5-325 MG PO TABS: 1.0000 | ORAL_TABLET | Freq: Four times a day (QID) | ORAL | Status: DC | PRN

## 2013-02-17 NOTE — Progress Notes (Signed)
I have seen and examined the patient and agree with the assessment and plans. Wound care started.  Vergil Burby A. Ninfa Linden  MD, FACS

## 2013-02-17 NOTE — Progress Notes (Signed)
Report called to Evans at [facility].  

## 2013-02-17 NOTE — Progress Notes (Signed)
Physical Therapy Treatment Patient Details Name: [name] MRN: [id number] DOB: 1950-03-31 Today's Date: 02/17/2013 Time: 1607-3710 PT Time Calculation (min): 23 min  PT Assessment / Plan / Recommendation  History of Present Illness This is a 63 year old female who presented with progressively worsening left lower quadrant abdominal pain as well as abdominal distention. She has lost weight recently. A CT scan showed a large mass at the splenic flexure of her colon with proximal obstruction. She was unable to undergo a bowel prep. She had a flexible sigmoidoscopy which showed an obstructing mass at her splenic flexure. She presents now for surgical resection.   PT Comments   Patient progressing with mobility. She continues to fatigue very quickly and requires several resting break but remains highly motivated to progress with therapy.   Follow Up Recommendations  SNF     Does the patient have the potential to tolerate intense rehabilitation     Barriers to Discharge        Equipment Recommendations       Recommendations for Other Services    Frequency Min 3X/week   Progress towards PT Goals Progress towards PT goals: Progressing toward goals  Plan Current plan remains appropriate    Precautions / Restrictions Precautions Precautions: None Restrictions Weight Bearing Restrictions: No   Pertinent Vitals/Pain Denies pain   Mobility  Transfers Overall transfer level: Needs assistance Sit to Stand: Min guard General transfer comment: Minguard for safety. Cues for technique Ambulation/Gait Ambulation/Gait assistance: Supervision Ambulation Distance (Feet): 300 Feet Assistive device: Rolling walker (2 wheeled) Gait Pattern/deviations: Step-through pattern;Decreased stride length;Wide base of support Gait velocity: decreased Gait velocity interpretation: Below normal speed for age/gender General Gait Details: Patient continues to require several rest breaks during  ambulation    Exercises     PT Diagnosis:    PT Problem List:   PT Treatment Interventions:     PT Goals (current goals can now be found in the care plan section)    Visit Information  Last PT Received On: 02/17/13 Assistance Needed: +1 History of Present Illness: This is a 63 year old female who presented with progressively worsening left lower quadrant abdominal pain as well as abdominal distention. She has lost weight recently. A CT scan showed a large mass at the splenic flexure of her colon with proximal obstruction. She was unable to undergo a bowel prep. She had a flexible sigmoidoscopy which showed an obstructing mass at her splenic flexure. She presents now for surgical resection.    Subjective Data      Cognition  Cognition Arousal/Alertness: Awake/alert Behavior During Therapy: WFL for tasks assessed/performed Overall Cognitive Status: Within Functional Limits for tasks assessed    Balance  Balance Overall balance assessment: Needs assistance Sitting balance-Leahy Scale: Good Standing balance-Leahy Scale: Good  End of Session PT - End of Session Activity Tolerance: Patient tolerated treatment well Patient left: with call bell/phone within reach;in chair Nurse Communication: Mobility status   GP     Jacqualyn Posey 02/17/2013, 9:37 AM  02/17/2013 Jacqualyn Posey PTA [phone] pager [phone] office

## 2013-02-17 NOTE — Progress Notes (Signed)
Pt to be d/c today to [facility].   Pt and family agreeable. Confirmed plans with facility.  Plan transfer via EMS.    [facility]  [address];  [phone] Phone: [phone]

## 2013-02-17 NOTE — Progress Notes (Signed)
13 Days Post-Op  Subjective: No complaints, up in room.  Tolerating diet.  Objective: Vital signs in last 24 hours: Temp:  [98.1 F (36.7 C)-98.5 F (36.9 C)] 98.3 F (36.8 C) (01/21 0611) Pulse Rate:  [67-78] 67 (01/21 0611) Resp:  [18-19] 19 (01/21 0611) BP: (100-120)/(49-64) 118/57 mmHg (01/21 0611) SpO2:  [96 %-99 %] 96 % (01/21 0611) Weight:  [242 lb 4.6 oz (109.9 kg)] 242 lb 4.6 oz (109.9 kg) (01/21 0337) Last BM Date: 02/17/13  Intake/Output from previous day: 01/20 0701 - 01/21 0700 In: 600 [P.O.:600] Out: 2375 [Urine:2075; Stool:300] Intake/Output this shift:    PE Abd: soft, appropriately tender.  Rt colostomy with adequate output.  Draining wound opened.    Lab Results:   Recent Labs  02/15/13 0550 02/17/13 0355  WBC 12.6* 11.5*  HGB 7.8* 7.9*  HCT 23.3* 24.7*  PLT 332 335   BMET  Recent Labs  02/16/13 0425 02/17/13 0355  NA  --  147  K 3.7 3.4*  CL  --  111  CO2  --  21  GLUCOSE  --  99  BUN  --  10  CREATININE  --  1.18*  CALCIUM  --  8.4   PT/INR No results found for this basename: LABPROT, INR,  in the last 72 hours ABG No results found for this basename: PHART, PCO2, PO2, HCO3,  in the last 72 hours  Studies/Results: No results found.  Anti-infectives: Anti-infectives   Start     Dose/Rate Route Frequency Ordered Stop   02/07/13 1000  vancomycin (VANCOCIN) 1,500 mg in sodium chloride 0.9 % 500 mL IVPB  Status:  Discontinued     1,500 mg 250 mL/hr over 120 Minutes Intravenous Every 24 hours 02/07/13 0659 02/07/13 0911   02/06/13 1000  vancomycin (VANCOCIN) IVPB 1000 mg/200 mL premix  Status:  Discontinued     1,000 mg 200 mL/hr over 60 Minutes Intravenous Every 24 hours 02/05/13 0757 02/07/13 0659   02/05/13 0830  vancomycin (VANCOCIN) 2,000 mg in sodium chloride 0.9 % 500 mL IVPB     2,000 mg 250 mL/hr over 120 Minutes Intravenous  Once 02/05/13 0757 02/05/13 1100   02/05/13 0815  piperacillin-tazobactam (ZOSYN) IVPB 3.375 g   Status:  Discontinued     3.375 g 12.5 mL/hr over 240 Minutes Intravenous 3 times per day 02/05/13 0757 02/08/13 1016   02/04/13 1515  cefoTEtan (CEFOTAN) 1 g in dextrose 5 % 50 mL IVPB  Status:  Discontinued     1 g 100 mL/hr over 30 Minutes Intravenous Every 12 hours 02/04/13 1511 02/05/13 0851   02/04/13 1045  cefoTEtan (CEFOTAN) 2 g in dextrose 5 % 50 mL IVPB     2 g 100 mL/hr over 30 Minutes Intravenous On call to O.R. 02/04/13 1039 02/04/13 1030      Assessment/Plan: Partial colectomy with colostomy (Dr. 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 POD #13 She has a follow up scheduled with Dr. Georgette Dover for further management. She has a protracted stoma, but functional.   i opened a part of her wound, supraumbilical area which had purulent output, this was packed.  Start bid wet to dry dressing changes. Stable for discharge to SNF, awaiting bed placement.   LOS: 15 days    Rumi Taras ANP-BC 02/17/2013 10:50 AM

## 2013-02-17 NOTE — Progress Notes (Addendum)
CSW spoke with [name] [phone]) concerning d/c planning and PASRR.   At this time the representative will not issue a PASRR for [name] even though we have offers. [name] would like CSW to search in [place] and send her there. CSW will speak with the Pt and discuss this.   CSW spoke with Pt and Pt stated that all of her medical doctors are here in [place]. Pt would like to remain in this area for SNF and then return home.   CSW awaiting a call back from facilities for placement.   [facility] will accept Pt and has a private room, however is concerned about having to transport the Pt back and forth to receive medical treatment for her cancer.   CSW contacted [name] back and is awaiting a return call. Pt is aware of bed offer.    [facility]  [address];  [phone] Phone: [phone]

## 2013-02-17 NOTE — Progress Notes (Signed)
DC by ambulance at this time

## 2013-02-18 ENCOUNTER — Other Ambulatory Visit: Payer: Self-pay | Admitting: *Deleted

## 2013-02-18 DIAGNOSIS — R05 Cough: Secondary | ICD-10-CM

## 2013-02-18 DIAGNOSIS — R059 Cough, unspecified: Secondary | ICD-10-CM

## 2013-02-18 MED ORDER — HYDROCODONE-ACETAMINOPHEN 5-325 MG PO TABS: ORAL_TABLET | ORAL | Status: DC

## 2013-02-18 MED ORDER — PROMETHAZINE-CODEINE 6.25-10 MG/5ML PO SYRP: ORAL_SOLUTION | ORAL | Status: DC

## 2013-02-19 ENCOUNTER — Non-Acute Institutional Stay (SKILLED_NURSING_FACILITY): Payer: Medicare Other | Admitting: Internal Medicine

## 2013-02-19 DIAGNOSIS — J45909 Unspecified asthma, uncomplicated: Secondary | ICD-10-CM | POA: Diagnosis not present

## 2013-02-19 DIAGNOSIS — R609 Edema, unspecified: Secondary | ICD-10-CM

## 2013-02-19 DIAGNOSIS — E2740 Unspecified adrenocortical insufficiency: Secondary | ICD-10-CM

## 2013-02-19 DIAGNOSIS — R5383 Other fatigue: Secondary | ICD-10-CM

## 2013-02-19 DIAGNOSIS — K219 Gastro-esophageal reflux disease without esophagitis: Secondary | ICD-10-CM

## 2013-02-19 DIAGNOSIS — D509 Iron deficiency anemia, unspecified: Secondary | ICD-10-CM | POA: Diagnosis not present

## 2013-02-19 DIAGNOSIS — C189 Malignant neoplasm of colon, unspecified: Secondary | ICD-10-CM

## 2013-02-19 DIAGNOSIS — R5381 Other malaise: Secondary | ICD-10-CM | POA: Diagnosis not present

## 2013-02-19 DIAGNOSIS — E876 Hypokalemia: Secondary | ICD-10-CM

## 2013-02-19 DIAGNOSIS — E2749 Other adrenocortical insufficiency: Secondary | ICD-10-CM

## 2013-02-19 DIAGNOSIS — R531 Weakness: Secondary | ICD-10-CM

## 2013-02-19 DIAGNOSIS — D72829 Elevated white blood cell count, unspecified: Secondary | ICD-10-CM
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
## 2013-03-03 ENCOUNTER — Telehealth: Payer: Self-pay | Admitting: Internal Medicine

## 2013-03-03 NOTE — Telephone Encounter (Signed)
[name] has another Dr.'s appt. and was hoping to come by and get a shot Fri.. I explained to her her vac. had exp. and ask her to call and leave a mess. for you. Anyway, she said she should be going home this wk. From [facility]. and her husband would be able to bring her within two wks.. Please advise. Her last shot was 01/29/13 1:10 at 0.5. Please let me know by tomorrow if you can so if you decide you want me to make her vac. I'll have plenty of time. Thank you! [name]

## 2013-03-03 NOTE — Telephone Encounter (Signed)
Ok to resume allergy vaccine at 0.4/ 1:10 and rebuild to 0.5/ week as tolerated.

## 2013-03-04 ENCOUNTER — Non-Acute Institutional Stay (SKILLED_NURSING_FACILITY): Payer: Medicare Other | Admitting: Internal Medicine

## 2013-03-04 DIAGNOSIS — D509 Iron deficiency anemia, unspecified: Secondary | ICD-10-CM

## 2013-03-04 DIAGNOSIS — R5383 Other fatigue: Secondary | ICD-10-CM

## 2013-03-04 DIAGNOSIS — R531 Weakness: Secondary | ICD-10-CM

## 2013-03-04 DIAGNOSIS — R5381 Other malaise: Secondary | ICD-10-CM
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
## 2013-03-04 NOTE — Telephone Encounter (Signed)
I will call pt. to give her your advice and put a note on her allergy record.

## 2013-03-05 ENCOUNTER — Encounter (INDEPENDENT_AMBULATORY_CARE_PROVIDER_SITE_OTHER): Payer: Self-pay | Admitting: Surgery

## 2013-03-05 ENCOUNTER — Ambulatory Visit (INDEPENDENT_AMBULATORY_CARE_PROVIDER_SITE_OTHER): Payer: Medicare Other

## 2013-03-05 ENCOUNTER — Ambulatory Visit (INDEPENDENT_AMBULATORY_CARE_PROVIDER_SITE_OTHER): Payer: Medicare Other | Admitting: Surgery

## 2013-03-05 ENCOUNTER — Telehealth: Payer: Self-pay | Admitting: *Deleted

## 2013-03-05 VITALS — BP 134/82 | HR 96 | Temp 98.1°F | Resp 14 | Ht 62.0 in | Wt 226.4 lb

## 2013-03-05 DIAGNOSIS — R531 Weakness: Secondary | ICD-10-CM | POA: Insufficient documentation

## 2013-03-05 DIAGNOSIS — J309 Allergic rhinitis, unspecified: Secondary | ICD-10-CM | POA: Diagnosis not present

## 2013-03-05 DIAGNOSIS — Z85038 Personal history of other malignant neoplasm of large intestine: Secondary | ICD-10-CM | POA: Insufficient documentation

## 2013-03-05 DIAGNOSIS — C184 Malignant neoplasm of transverse colon: Secondary | ICD-10-CM

## 2013-03-05 DIAGNOSIS — D509 Iron deficiency anemia, unspecified: Secondary | ICD-10-CM | POA: Insufficient documentation

## 2013-03-05 NOTE — Telephone Encounter (Signed)
Received referral in [facility] and I emailed [name] to schedule her due to the diagnosis and sent Dr. Georgette Dover an email to make him aware.

## 2013-03-05 NOTE — Progress Notes (Signed)
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 She had a prolonged postoperative course but was discharged to a skilled nursing facility a couple of weeks ago. She is working with physical therapy. Overall she seems to be doing better. Her appetite is good. Her colostomy is functioning well. Part of her wound was open prior to discharge and they are  Packing the wound daily.  Filed Vitals:   03/05/13 0903  BP: 134/82  Pulse: 96  Temp: 98.1 F (36.7 C)  Resp: 14   Her abdomen is soft and nontender. The right sided colostomy is functioning well with gas and stool within the bag. There is no prolapse. Her midline incision is well approximated. The lower half of the wound shows a 2 cm area that is barely open superficially. This area is clean. This can be treated with Neosporin and dry gauze. The upper part of her incision has a 2 cm opening that tunnels down to about 3 cm. This was packed with a saline moistened 2 x 2 gauze. This should be changed daily.  TcN1Mx adenocarcinoma of the splenic flexure.  Imp:   Continue dressing changes. Reevaluate in 3-4 weeks. We will refer her to oncology for possible adjuvant therapy.  We will wait at least 3-6 months before reversing her colostomy.  Imogene Burn. Georgette Dover, MD, [facility]  General/ Trauma Surgery  03/05/2013 10:56 AM

## 2013-03-08 ENCOUNTER — Telehealth: Payer: Self-pay | Admitting: *Deleted

## 2013-03-08 NOTE — Telephone Encounter (Signed)
Spoke with patient and her nurse by phone re: appointment with [name] on 03/12/13.  Patient is currently residing at [facility] [phone])  SNF will provide transportation to the 03/12/13 MD visit. Contact name and numbers were provided.

## 2013-03-12 ENCOUNTER — Ambulatory Visit: Payer: Medicare Other

## 2013-03-12 ENCOUNTER — Ambulatory Visit (HOSPITAL_BASED_OUTPATIENT_CLINIC_OR_DEPARTMENT_OTHER): Payer: Medicare Other

## 2013-03-12 ENCOUNTER — Encounter: Payer: Self-pay | Admitting: Oncology

## 2013-03-12 ENCOUNTER — Telehealth: Payer: Self-pay | Admitting: *Deleted

## 2013-03-12 ENCOUNTER — Telehealth: Payer: Self-pay | Admitting: Oncology

## 2013-03-12 ENCOUNTER — Ambulatory Visit (HOSPITAL_BASED_OUTPATIENT_CLINIC_OR_DEPARTMENT_OTHER): Payer: Medicare Other | Admitting: Oncology

## 2013-03-12 ENCOUNTER — Ambulatory Visit (INDEPENDENT_AMBULATORY_CARE_PROVIDER_SITE_OTHER): Payer: Medicare Other

## 2013-03-12 VITALS — BP 134/79 | HR 111 | Temp 97.9°F | Resp 21 | Ht 62.0 in | Wt 217.0 lb

## 2013-03-12 DIAGNOSIS — J309 Allergic rhinitis, unspecified: Secondary | ICD-10-CM | POA: Diagnosis not present

## 2013-03-12 DIAGNOSIS — C185 Malignant neoplasm of splenic flexure: Secondary | ICD-10-CM | POA: Diagnosis not present

## 2013-03-12 DIAGNOSIS — D509 Iron deficiency anemia, unspecified: Secondary | ICD-10-CM

## 2013-03-12 DIAGNOSIS — D649 Anemia, unspecified: Secondary | ICD-10-CM

## 2013-03-12 DIAGNOSIS — I10 Essential (primary) hypertension: Secondary | ICD-10-CM

## 2013-03-12 DIAGNOSIS — C184 Malignant neoplasm of transverse colon: Secondary | ICD-10-CM

## 2013-03-12 DIAGNOSIS — C772 Secondary and unspecified malignant neoplasm of intra-abdominal lymph nodes: Secondary | ICD-10-CM

## 2013-03-12 LAB — CBC WITH DIFFERENTIAL/PLATELET
BASO%: 0.8 % (ref 0.0–2.0)
BASOS ABS: 0.1 10*3/uL (ref 0.0–0.1)
EOS%: 0.1 % (ref 0.0–7.0)
Eosinophils Absolute: 0 10*3/uL (ref 0.0–0.5)
HCT: 26.2 % — ABNORMAL LOW (ref 34.8–46.6)
HEMOGLOBIN: 8.1 g/dL — AB (ref 11.6–15.9)
LYMPH#: 1.9 10*3/uL (ref 0.9–3.3)
LYMPH%: 17.7 % (ref 14.0–49.7)
MCH: 20.2 pg — AB (ref 25.1–34.0)
MCHC: 31 g/dL — ABNORMAL LOW (ref 31.5–36.0)
MCV: 65.2 fL — ABNORMAL LOW (ref 79.5–101.0)
MONO#: 1.1 10*3/uL — ABNORMAL HIGH (ref 0.1–0.9)
MONO%: 10.6 % (ref 0.0–14.0)
NEUT%: 70.8 % (ref 38.4–76.8)
NEUTROS ABS: 7.4 10*3/uL — AB (ref 1.5–6.5)
Platelets: 379 10*3/uL (ref 145–400)
RBC: 4.02 10*6/uL (ref 3.70–5.45)
RDW: 18.2 % — ABNORMAL HIGH (ref 11.2–14.5)
WBC: 10.5 10*3/uL — ABNORMAL HIGH (ref 3.9–10.3)

## 2013-03-12 NOTE — Telephone Encounter (Signed)
Called order for ferrous sulfate 325 mg TID to [facility]. Spoke with pt as well, informed her that lab will be rechecked at next office visit.

## 2013-03-12 NOTE — Progress Notes (Signed)
Met with [name] and family. Explained role of nurse navigator. Educational information provided on colorectal cancer  Referral made to dietician for diet education and she will be seen at next visit. [facility] resources provided to patient, including SW service information.  She is currently residing in [facility] and is being taught ostomy/wound care.  She plans to return home soon with her husband.  Contact names and phone numbers were provided for entire [facility] team.  Teach back method was used.  No barriers to care identified at present time.  Will continue to follow as needed.

## 2013-03-12 NOTE — Progress Notes (Signed)
[facility] Patient Consult   Referring MD: Xoie Kreuser 63 y.o.  09-04-1950    Reason for Referral: Colon cancer     HPI: She presented to the emergency room on 02/02/2013 with abdominal pain and nausea/vomiting. She reports an approximate 2 week history of progressive pain. A CT of the abdomen and pelvis revealed abrupt tapering at the splenic flexure with small but conspicuous adjacent mesenteric lymph nodes. The upper screen colon was dilated to the distal small bowel. No liver lesion. Negative lung bases.  She was referred to Dr. Ardis Hughs and underwent a sigmoidoscopy on 02/03/2013. A large friable fungating tumor completely obstructing the lumen was noted at the splenic flexure. A biopsy was positive for adenocarcinoma.  She was taken to the operating room by Dr. Georgette Dover on 02/04/2013. A mass was palpated splenic flexure. The transverse colon and mid descending colon were divided. A temporary colostomy was created secondary to the dilated colon and lack of a bowel prep.  The pathology([id number]) confirmed an ulcerated invasive adenocarcinoma. No lymphovascular or perineural invasion. Tumor extended into the pericolonic tissue. One of 15 lymph nodes was positive for metastatic adenocarcinoma. No macroscopic tumor perforation. The proximal,distal, and mesenteric margins were negative. No additional polyps. The tumor was submitted for mismatch repair protein testing and there is loss of MLH1 and PMS2 expression. The tumor returned microsatellite instability high.  She was discharged to a skilled nursing facility on 02/19/2013. The abdominal wound became infected in the postoperative setting. There wound is being packed at the skilled nursing facility.  She reports regaining strength since discharge from the hospital. She is now ambulatory.  Past Medical History  Diagnosis Date  . Chronic cough   . Allergic rhinitis   . Abnormal glucose   .  G2 P2     .  colon cancer stage III (T3 N1)  02/04/2013   .    .    .    . Colon polyp   . OSA (obstructive sleep apnea)     on cpap  . Obesity   . Hypertension   . GERD (gastroesophageal reflux disease)   . Asthma     Past Surgical History  Procedure Laterality Date  . Appendectomy  1970s  . Cholecystectomy  1983  . Abdominal hysterectomy w/ partial vaginactomy  1982    has one remaining ovary  . Wrist surgery  2003    Right  . Shoulder surgery  2005    Right  . Ankle fracture surgery  4/11    ORIF Dr. Marlou Sa  . Abdominal hysterectomy    . Colonoscopy N/A 02/03/2013    Procedure: COLONOSCOPY;  Surgeon: Milus Banister, MD;  Location: [place];  Service: Endoscopy;  Laterality: N/A;  . Colon resection N/A 02/04/2013    Procedure: PARTIAL COLECTOMY;  Surgeon: Imogene Burn. Georgette Dover, MD;  Location: [place];  Service: General;  Laterality: N/A;  . 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 Georgette Dover, MD;  Location: [place] [facility];  Service: General;  Laterality: Right;    Family History  Problem Relation Age of Onset  . Cancer Mother   . Heart disease Father   . Suicidality Other     siblings  . Cancer Other     siblings  7 siblings. A brother had prostate cancer. A sister had breast cancer. No family history of colon or rectal cancer.  Current outpatient prescriptions:albuterol (PROAIR HFA) 108 (90  BASE) MCG/ACT inhaler, Inhale 2 puffs into the lungs every 6 (six) hours as needed for wheezing or shortness of breath., Disp: 3 Inhaler, Rfl: 3;  aspirin 81 MG tablet, Take 81 mg by mouth daily.  , Disp: , Rfl: ;  Cholecalciferol (VITAMIN D) 1000 UNITS capsule, Take 1,000 Units by mouth daily.  , Disp: , Rfl:  ferrous sulfate 325 (65 FE) MG tablet, Take 325 mg by mouth 2 (two) times daily with a meal. , Disp: , Rfl: ;  Fluticasone-Salmeterol (ADVAIR DISKUS) 250-50 MCG/DOSE AEPB, 1 puff, then rinse mouth, twice daily, Disp: 180 each, Rfl: 3;  furosemide (LASIX) 20 MG tablet, Take  20 mg by mouth., Disp: , Rfl: ;  HYDROcodone-acetaminophen (NORCO/VICODIN) 5-325 MG per tablet, Take one tablet by mouth every 6 hours as needed for pain, Disp: 120 tablet, Rfl: 0 mometasone (NASONEX) 50 MCG/ACT nasal spray, Place 2 sprays into the nose daily as needed., Disp: 51 g, Rfl: 3;  montelukast (SINGULAIR) 10 MG tablet, Take 1 tablet (10 mg total) by mouth at bedtime., Disp: 90 tablet, Rfl: 3;  omeprazole (PRILOSEC) 20 MG capsule, Take 1 capsule (20 mg total) by mouth daily., Disp: 90 capsule, Rfl: 3;  potassium chloride (K-DUR) 10 MEQ tablet, Take 10 mEq by mouth daily., Disp: , Rfl:  promethazine-codeine (PHENERGAN WITH CODEINE) 6.25-10 MG/5ML syrup, Take 30m by mouth every 6 hours as needed for cough, Disp: 120 mL, Rfl: 0;  alum & mag hydroxide-simeth (MAALOX/MYLANTA) 200-200-20 MG/5ML suspension, Take 30 mLs by mouth every 6 (six) hours as needed for indigestion or heartburn., Disp: 355 mL, Rfl: 0;  tuberculin (APLISOL) 5 UNIT/0.1ML injection, Inject into the skin once., Disp: , Rfl:   Allergies: No Known Allergies  Social History: [place] She does not use tobacco or alcohol. She believes that she receive a Red cell transfusion when she underwent an appendectomy. No risk factor for HIV or hepatitis.   ROS:   Positives include: Early satiety prior to surgery, constipation prior to surgery, left abdominal pain prior to surgery, cough when she has a flare of asthma  A complete ROS was otherwise negative.  Physical Exam:  Blood pressure 134/79, pulse 111, temperature 97.9 F (36.6 C), temperature source Oral, resp. rate 21, height _0  (1.575 m), weight 217 lb (98.431 kg), SpO2 96.00%. repeat manual pulse 92  HEENT: Oropharynx without visible mass, neck without mass Lungs: Clear bilaterally Cardiac: Regular rate and rhythm Abdomen: No hepatosplenomegaly, nontender, no mass, right abdomen colostomy. Approximate 1 cm opening at the upper portion  of the midline wound with a packing in place.  Vascular: No leg edema-support stockings in place Lymph nodes: No cervical, supraclavicular, axillary, or inguinal nodes Neurologic: Alert and oriented, the motor exam appears intact in the upper and lower extremities Skin: No rash Musculoskeletal: No spine tenderness   LAB:  CBC  Lab Results  Component Value Date   WBC 10.5* 03/12/2013   HGB 8.1* 03/12/2013   HCT 26.2* 03/12/2013   MCV 65.2* 03/12/2013   PLT 379 03/12/2013   NEUTROABS 7.4* 03/12/2013     CMP      Component Value Date/Time   NA 147 02/17/2013 0355   K 3.4* 02/17/2013 0355   CL 111 02/17/2013 0355   CO2 21 02/17/2013 0355   GLUCOSE 99 02/17/2013 0355   BUN 10 02/17/2013 0355   CREATININE 1.18* 02/17/2013 0355   CALCIUM 8.4 02/17/2013 0355   PROT 6.1 02/09/2013 0400  ALBUMIN 2.0* 02/09/2013 0400   AST 15 02/09/2013 0400   ALT 14 02/09/2013 0400   ALKPHOS 124* 02/09/2013 0400   BILITOT 0.5 02/09/2013 0400   GFRNONAA 48* 02/17/2013 0355   GFRAA 56* 02/17/2013 0355   ferritin 13.3 on 01/27/2013  CEA less than 0.5 02/02/2013  Radiology: Chest x-ray 02/05/2013-no focal consolidation or pleural effusion.    Assessment/Plan:   1. 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 Iron deficiency anemia  3. Asthma  4. Healing abdominal wound    Disposition:   Ms. Boroff has been diagnosed with stage III colon cancer. I discussed the prognosis and adjuvant treatment options with Ms. Chicas and her husband. We reviewed the details of the surgical pathology report. She appears to have a good prognosis relative to the average stage  III patient, but there is a significant chance of developing recurrent disease over the next several years. We discussed the benefit associated with adjuvant 5-fluorouracil chemotherapy. We also discussed the expected benefit with the addition of oxaliplatin. She has a borderline performance status and comorbid conditions. I think it would be difficult for her to tolerate oxaliplatin in her present condition.  I recommended she consider adjuvant treatment with capecitabine. We reviewed the specific toxicities associated with capecitabine including the chance for mucositis, diarrhea, and hematologic toxicity. We discussed the rash, hyperpigmentation, and hand/foot syndrome associated with capecitabine.  She remains in a skilled nursing facility with an open abdominal wound. She will return for an office visit and chemotherapy teaching class on 03/25/2013. We will ask her to increase the ferrous sulfate to 3 times daily.  The loss of mismatch repair protein expression is likely related to the motor hypermethylation. We submitted BRAF testing.    [place], [place] 03/12/2013, 5:51 PM

## 2013-03-12 NOTE — Progress Notes (Signed)
BRAF testing requested per [name] on Accession: [id number].  Spoke with J. Epps in pathology.

## 2013-03-12 NOTE — Telephone Encounter (Signed)
Message copied by Brien Few on Fri Mar 12, 2013  5:32 PM ------      Message from: Ladell Pier      Created: Fri Mar 12, 2013  5:16 PM       Please call patient, increase ferrous sulfate to tid, check cbc next visit ------

## 2013-03-12 NOTE — Telephone Encounter (Signed)
gv adn printed appt sched anda vs for pt for Feb °

## 2013-03-12 NOTE — Telephone Encounter (Signed)
Talked to pt and gave her alb for 03/25/12

## 2013-03-15 ENCOUNTER — Encounter: Payer: Self-pay | Admitting: Internal Medicine

## 2013-03-15 ENCOUNTER — Non-Acute Institutional Stay (SKILLED_NURSING_FACILITY): Payer: Medicare Other | Admitting: Internal Medicine

## 2013-03-15 DIAGNOSIS — K219 Gastro-esophageal reflux disease without esophagitis: Secondary | ICD-10-CM

## 2013-03-15 DIAGNOSIS — C189 Malignant neoplasm of colon, unspecified: Secondary | ICD-10-CM | POA: Diagnosis not present

## 2013-03-15 DIAGNOSIS — E2740 Unspecified adrenocortical insufficiency: Secondary | ICD-10-CM

## 2013-03-15 DIAGNOSIS — J45909 Unspecified asthma, uncomplicated: Secondary | ICD-10-CM

## 2013-03-15 DIAGNOSIS — R5381 Other malaise: Secondary | ICD-10-CM

## 2013-03-15 DIAGNOSIS — E2749 Other adrenocortical insufficiency: Secondary | ICD-10-CM

## 2013-03-15 DIAGNOSIS — R5383 Other fatigue: Secondary | ICD-10-CM | POA: Diagnosis not present

## 2013-03-15 DIAGNOSIS — D509 Iron deficiency anemia, unspecified: Secondary | ICD-10-CM | POA: Diagnosis not present

## 2013-03-15 DIAGNOSIS — R531 Weakness: Secondary | ICD-10-CM
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
## 2013-03-16 ENCOUNTER — Other Ambulatory Visit (HOSPITAL_COMMUNITY)
Admission: RE | Admit: 2013-03-16 | Discharge: 2013-03-16 | Disposition: A | Discharge status: Home / Self Care | Payer: Medicare Other | Source: Ambulatory Visit | Attending: Oncology | Admitting: Oncology

## 2013-03-16 DIAGNOSIS — C189 Malignant neoplasm of colon, unspecified: Secondary | ICD-10-CM | POA: Insufficient documentation

## 2013-03-17 ENCOUNTER — Ambulatory Visit (INDEPENDENT_AMBULATORY_CARE_PROVIDER_SITE_OTHER): Payer: Medicare Other

## 2013-03-17 DIAGNOSIS — J309 Allergic rhinitis, unspecified: Secondary | ICD-10-CM

## 2013-03-22 ENCOUNTER — Encounter (HOSPITAL_COMMUNITY): Payer: Self-pay

## 2013-03-22 ENCOUNTER — Telehealth: Payer: Self-pay | Admitting: *Deleted

## 2013-03-22 NOTE — Telephone Encounter (Signed)
Yes. Thx.

## 2013-03-22 NOTE — Telephone Encounter (Signed)
RN with [facility] in [place], [place] phoned inquiring if PCP would continue to be attending physician for patient and for orders (that originally were sent to them by [facility]) for Skilled Nursing for wound care and for physical therapy.  Please advise.  CB# [phone]

## 2013-03-22 NOTE — Telephone Encounter (Signed)
Received fax from Pathology. Colon-Splenic Flexure Tissue [id number]: BRAF mutation detected. Results to [name] for review.

## 2013-03-23 NOTE — Telephone Encounter (Signed)
Phoned & gave verbal orders to [facility] in [place], [place] per MD orders.

## 2013-03-24 DIAGNOSIS — Z433 Encounter for attention to colostomy: Secondary | ICD-10-CM | POA: Diagnosis not present

## 2013-03-24 DIAGNOSIS — M6281 Muscle weakness (generalized): Secondary | ICD-10-CM | POA: Diagnosis not present

## 2013-03-24 DIAGNOSIS — S31109A Unspecified open wound of abdominal wall, unspecified quadrant without penetration into peritoneal cavity, initial encounter: Secondary | ICD-10-CM | POA: Diagnosis not present

## 2013-03-24 DIAGNOSIS — I10 Essential (primary) hypertension: Secondary | ICD-10-CM | POA: Diagnosis not present

## 2013-03-24 DIAGNOSIS — E785 Hyperlipidemia, unspecified: Secondary | ICD-10-CM | POA: Diagnosis not present

## 2013-03-24 DIAGNOSIS — J45909 Unspecified asthma, uncomplicated: Secondary | ICD-10-CM | POA: Diagnosis not present

## 2013-03-24 DIAGNOSIS — C19 Malignant neoplasm of rectosigmoid junction: Secondary | ICD-10-CM | POA: Diagnosis not present

## 2013-03-25 ENCOUNTER — Encounter: Payer: Medicare Other | Admitting: Nutrition

## 2013-03-25 ENCOUNTER — Other Ambulatory Visit: Payer: Medicare Other

## 2013-03-25 ENCOUNTER — Ambulatory Visit: Payer: Medicare Other | Admitting: Nurse Practitioner

## 2013-03-25 ENCOUNTER — Telehealth: Payer: Self-pay | Admitting: Oncology

## 2013-03-25 NOTE — Telephone Encounter (Signed)
Pt called and r/s 4 appt s to 04/01/13 lab,ml , chemo class and nutrition consult pt aware of all appts

## 2013-03-26 DIAGNOSIS — I10 Essential (primary) hypertension: Secondary | ICD-10-CM | POA: Diagnosis not present

## 2013-03-26 DIAGNOSIS — C19 Malignant neoplasm of rectosigmoid junction: Secondary | ICD-10-CM | POA: Diagnosis not present

## 2013-03-26 DIAGNOSIS — J45909 Unspecified asthma, uncomplicated: Secondary | ICD-10-CM | POA: Diagnosis not present

## 2013-03-26 DIAGNOSIS — Z433 Encounter for attention to colostomy: Secondary | ICD-10-CM | POA: Diagnosis not present

## 2013-03-26 DIAGNOSIS — M6281 Muscle weakness (generalized): Secondary | ICD-10-CM | POA: Diagnosis not present

## 2013-03-26 DIAGNOSIS — S31109A Unspecified open wound of abdominal wall, unspecified quadrant without penetration into peritoneal cavity, initial encounter: Secondary | ICD-10-CM | POA: Diagnosis not present

## 2013-03-27 DIAGNOSIS — S31109A Unspecified open wound of abdominal wall, unspecified quadrant without penetration into peritoneal cavity, initial encounter: Secondary | ICD-10-CM | POA: Diagnosis not present

## 2013-03-27 DIAGNOSIS — J45909 Unspecified asthma, uncomplicated: Secondary | ICD-10-CM | POA: Diagnosis not present

## 2013-03-27 DIAGNOSIS — M6281 Muscle weakness (generalized): Secondary | ICD-10-CM | POA: Diagnosis not present

## 2013-03-27 DIAGNOSIS — I10 Essential (primary) hypertension: Secondary | ICD-10-CM | POA: Diagnosis not present

## 2013-03-27 DIAGNOSIS — C19 Malignant neoplasm of rectosigmoid junction: Secondary | ICD-10-CM | POA: Diagnosis not present

## 2013-03-27 DIAGNOSIS — Z433 Encounter for attention to colostomy: Secondary | ICD-10-CM | POA: Diagnosis not present

## 2013-03-28 DIAGNOSIS — Z433 Encounter for attention to colostomy: Secondary | ICD-10-CM | POA: Diagnosis not present

## 2013-03-28 DIAGNOSIS — C19 Malignant neoplasm of rectosigmoid junction: Secondary | ICD-10-CM | POA: Diagnosis not present

## 2013-03-28 DIAGNOSIS — S31109A Unspecified open wound of abdominal wall, unspecified quadrant without penetration into peritoneal cavity, initial encounter: Secondary | ICD-10-CM | POA: Diagnosis not present

## 2013-03-28 DIAGNOSIS — J45909 Unspecified asthma, uncomplicated: Secondary | ICD-10-CM | POA: Diagnosis not present

## 2013-03-28 DIAGNOSIS — I10 Essential (primary) hypertension: Secondary | ICD-10-CM | POA: Diagnosis not present

## 2013-03-28 DIAGNOSIS — M6281 Muscle weakness (generalized): Secondary | ICD-10-CM | POA: Diagnosis not present

## 2013-03-29 ENCOUNTER — Encounter (INDEPENDENT_AMBULATORY_CARE_PROVIDER_SITE_OTHER): Payer: Self-pay | Admitting: Surgery

## 2013-03-29 ENCOUNTER — Ambulatory Visit (INDEPENDENT_AMBULATORY_CARE_PROVIDER_SITE_OTHER): Payer: Medicare Other

## 2013-03-29 ENCOUNTER — Ambulatory Visit (INDEPENDENT_AMBULATORY_CARE_PROVIDER_SITE_OTHER): Payer: Medicare Other | Admitting: Surgery

## 2013-03-29 DIAGNOSIS — J309 Allergic rhinitis, unspecified: Secondary | ICD-10-CM | POA: Diagnosis not present

## 2013-03-29 DIAGNOSIS — C184 Malignant neoplasm of transverse colon: Secondary | ICD-10-CM

## 2013-03-29 NOTE — Progress Notes (Signed)
Status post left hemicolectomy and a Hartman's pouch with a right-sided colostomy on 02/04/13 for an obstructing cancer the splenic flexure. The patient is scheduled to begin chemotherapy under the care of Dr. Julieanne Manson.  Her treatment has been delayed slightly by the increment weather last week. Overall she is feeling much better. She is regaining energy. She still uses a walker but is much more independent. Her ostomy is functioning well. She has some mild skin irritation around the ostomy but I think that the hole in her wafer is too large. She also has fairly firm stool from her right colostomy. Her midline incision is almost completely healed. There is a very superficial 5 mm opening that is well granulated. She can treat this area with Neosporin and a dry dressing.  Overall she seems to be improving.  We will see her back in 3 months to see how she is doing. We will wait until she has completed her adjuvant therapy before considering colostomy reversal.  [name]. Melissa Dover, MD, [facility]  General/ Trauma Surgery  03/29/2013 1:57 PM

## 2013-03-30 ENCOUNTER — Telehealth: Payer: Self-pay | Admitting: *Deleted

## 2013-03-30 DIAGNOSIS — C19 Malignant neoplasm of rectosigmoid junction: Secondary | ICD-10-CM | POA: Diagnosis not present

## 2013-03-30 DIAGNOSIS — J45909 Unspecified asthma, uncomplicated: Secondary | ICD-10-CM | POA: Diagnosis not present

## 2013-03-30 DIAGNOSIS — S31109A Unspecified open wound of abdominal wall, unspecified quadrant without penetration into peritoneal cavity, initial encounter: Secondary | ICD-10-CM | POA: Diagnosis not present

## 2013-03-30 DIAGNOSIS — Z433 Encounter for attention to colostomy: Secondary | ICD-10-CM | POA: Diagnosis not present

## 2013-03-30 DIAGNOSIS — M6281 Muscle weakness (generalized): Secondary | ICD-10-CM | POA: Diagnosis not present

## 2013-03-30 DIAGNOSIS — I10 Essential (primary) hypertension: Secondary | ICD-10-CM | POA: Diagnosis not present

## 2013-03-30 NOTE — Telephone Encounter (Signed)
Janalyn Rouse, PT, phoned requesting verbal order for physical therapy for transition for twice a week for 2 weeks.  Patient also requesting shower chair.  Order can be faxed to [phone]  CB# [place], [place]

## 2013-03-30 NOTE — Telephone Encounter (Signed)
Phoned and left verbal order for PT services and shower chair on PT's voicemail.

## 2013-03-30 NOTE — Telephone Encounter (Signed)
Ok Thx 

## 2013-03-31 ENCOUNTER — Encounter: Payer: Self-pay | Admitting: Internal Medicine

## 2013-03-31 ENCOUNTER — Ambulatory Visit (INDEPENDENT_AMBULATORY_CARE_PROVIDER_SITE_OTHER): Payer: Medicare Other | Admitting: Internal Medicine

## 2013-03-31 VITALS — BP 110/74 | HR 80 | Temp 98.5°F | Resp 16 | Wt 212.0 lb

## 2013-03-31 DIAGNOSIS — I10 Essential (primary) hypertension: Secondary | ICD-10-CM

## 2013-03-31 DIAGNOSIS — E669 Obesity, unspecified: Secondary | ICD-10-CM

## 2013-03-31 DIAGNOSIS — D510 Vitamin B12 deficiency anemia due to intrinsic factor deficiency: Secondary | ICD-10-CM

## 2013-03-31 DIAGNOSIS — J45909 Unspecified asthma, uncomplicated: Secondary | ICD-10-CM

## 2013-03-31 DIAGNOSIS — R112 Nausea with vomiting, unspecified: Secondary | ICD-10-CM

## 2013-03-31 DIAGNOSIS — C184 Malignant neoplasm of transverse colon: Secondary | ICD-10-CM

## 2013-03-31 MED ORDER — VITAMIN B-12 1000 MCG SL SUBL: 1.0000 | SUBLINGUAL_TABLET | Freq: Every day | SUBLINGUAL | Status: DC

## 2013-03-31 MED ORDER — VALSARTAN 160 MG PO TABS: 160.0000 mg | ORAL_TABLET | Freq: Every day | ORAL | Status: DC

## 2013-03-31 MED ORDER — CYANOCOBALAMIN 1000 MCG/ML IJ SOLN
1000.0000 ug | Freq: Once | INTRAMUSCULAR | Status: AC
  Administered 2013-04-01: 1000 ug via INTRAMUSCULAR

## 2013-03-31 NOTE — Assessment & Plan Note (Signed)
S/p resection in 1/15 - 02/04/13 Chemo - pending

## 2013-03-31 NOTE — Assessment & Plan Note (Signed)
Wt Readings from Last 3 Encounters:  03/31/13 212 lb (96.163 kg)  03/12/13 217 lb (98.431 kg)  03/05/13 226 lb 6.4 oz (102.694 kg)

## 2013-03-31 NOTE — Assessment & Plan Note (Signed)
Continue with current prescription therapy as reflected on the Med list.  

## 2013-03-31 NOTE — Progress Notes (Signed)
Subjective:  Pt presented to the emergency room on 02/02/2013 with abdominal pain and nausea/vomiting. She reports an approximate 2 week history of progressive pain. A CT of the abdomen and pelvis revealed abrupt tapering at the splenic flexure with small but conspicuous adjacent mesenteric lymph nodes. The upper screen colon was dilated to the distal small bowel. No liver lesion. Negative lung bases.  She was referred to Dr. Ardis Hughs and underwent a sigmoidoscopy on 02/03/2013. A large friable fungating tumor completely obstructing the lumen was noted at the splenic flexure. A biopsy was positive for adenocarcinoma.  She was taken to the operating room by Dr. Georgette Dover on 02/04/2013. A mass was palpated splenic flexure. The transverse colon and mid descending colon were divided. A temporary colostomy was created secondary to the dilated colon and lack of a bowel prep.  The pathology([id number]) confirmed an ulcerated invasive adenocarcinoma. No lymphovascular or perineural invasion. Tumor extended into the pericolonic tissue. One of 15 lymph nodes was positive for metastatic adenocarcinoma. No macroscopic tumor perforation. The proximal,distal, and mesenteric margins were negative. No additional polyps. The tumor was submitted for mismatch repair protein testing and there is loss of MLH1 and PMS2 expression. The tumor returned microsatellite instability high.  She was discharged to a skilled nursing facility on 02/19/2013. The abdominal wound became infected in the postoperative setting. There wound is being packed at the skilled nursing facility.  She reports regaining strength since discharge from the hospital. She is now ambulatory.    F/u  R knee pain - may need a TKR per Dr Marlou Sa   Hypertension This is a chronic problem. The current episode started more than 1 year ago. The problem is unchanged. Pertinent negatives include no headaches, neck pain, palpitations or shortness of breath. There is no  history of kidney disease.   The patient presents for a follow-up of  chronic GERD, asthma controlled with medicines  BP Readings from Last 3 Encounters:  03/31/13 110/74  03/12/13 134/79  03/05/13 134/82   Wt Readings from Last 3 Encounters:  03/31/13 212 lb (96.163 kg)  03/12/13 217 lb (98.431 kg)  03/05/13 226 lb 6.4 oz (102.694 kg)        Review of Systems  Constitutional: Negative.  Negative for fever, chills, diaphoresis, activity change, appetite change and unexpected weight change.  HENT: Negative for congestion, ear pain, facial swelling, hearing loss, mouth sores, nosebleeds, postnasal drip, rhinorrhea, sinus pressure, sneezing, sore throat, tinnitus and trouble swallowing.   Eyes: Negative for pain, discharge, redness, itching and visual disturbance.  Respiratory: Negative for cough, chest tightness, shortness of breath, wheezing and stridor.   Cardiovascular: Negative for palpitations and leg swelling.  Gastrointestinal: Negative for nausea, diarrhea, constipation, blood in stool, abdominal distention, anal bleeding and rectal pain.  Genitourinary: Negative for dysuria, urgency, frequency, hematuria, flank pain, vaginal bleeding, vaginal discharge, difficulty urinating, genital sores and pelvic pain.  Musculoskeletal: Negative for arthralgias, back pain, gait problem, joint swelling, neck pain and neck stiffness.       L arm in the biceps area as above  Skin: Negative.  Negative for rash.  Neurological: Negative for dizziness, tremors, seizures, syncope, speech difficulty, weakness and headaches.  Hematological: Negative for adenopathy. Does not bruise/bleed easily.  Psychiatric/Behavioral: Negative for suicidal ideas, behavioral problems, sleep disturbance, dysphoric mood and decreased concentration. The patient is not nervous/anxious.        Objective:   Physical Exam  Constitutional: She appears well-developed. No distress.  Obese  HENT:  Head:  Normocephalic.   Right Ear: External ear normal.  Left Ear: External ear normal.  Nose: Nose normal.  Mouth/Throat: Oropharynx is clear and moist.  Eyes: Conjunctivae are normal. Pupils are equal, round, and reactive to light. Right eye exhibits no discharge. Left eye exhibits no discharge.  Neck: Normal range of motion. Neck supple. No JVD present. No tracheal deviation present. No thyromegaly present.  Cardiovascular: Normal rate, regular rhythm and normal heart sounds.   Pulmonary/Chest: No stridor. No respiratory distress. She has no wheezes.  Abdominal: Soft. Bowel sounds are normal. She exhibits no distension and no mass. There is no tenderness. There is no rebound and no guarding.  Musculoskeletal: She exhibits no edema and no tenderness.  Lymphadenopathy:    She has no cervical adenopathy.  Neurological: She displays normal reflexes. No cranial nerve deficit. She exhibits normal muscle tone. Coordination normal.  Skin: No rash noted. No erythema.  Psychiatric: She has a normal mood and affect. Her behavior is normal. Judgment and thought content normal.  Walker Abd scars and R colostomy  Lab Results  Component Value Date   WBC 10.5* 03/12/2013   HGB 8.1* 03/12/2013   HCT 26.2* 03/12/2013   PLT 379 03/12/2013   GLUCOSE 99 02/17/2013   CHOL 144 07/14/2012   TRIG 59.0 07/14/2012   HDL 76.60 07/14/2012   LDLCALC 56 07/14/2012   ALT 14 02/09/2013   AST 15 02/09/2013   NA 147 02/17/2013   K 3.4* 02/17/2013   CL 111 02/17/2013   CREATININE 1.18* 02/17/2013   BUN 10 02/17/2013   CO2 21 02/17/2013   TSH 1.91 01/27/2013   INR 1.07 02/02/2013   HGBA1C 6.2 09/07/2009         Assessment & Plan:

## 2013-03-31 NOTE — Assessment & Plan Note (Signed)
Vit B12 

## 2013-03-31 NOTE — Progress Notes (Signed)
Pre visit review using our clinic review tool, if applicable. No additional management support is needed unless otherwise documented below in the visit note. 

## 2013-03-31 NOTE — Patient Instructions (Signed)
Do not take Valsartan if your blood pressure stays low

## 2013-03-31 NOTE — Assessment & Plan Note (Signed)
Zofran po

## 2013-04-01 ENCOUNTER — Telehealth: Payer: Self-pay | Admitting: Oncology

## 2013-04-01 ENCOUNTER — Other Ambulatory Visit: Payer: Medicare Other

## 2013-04-01 ENCOUNTER — Encounter: Payer: Self-pay | Admitting: *Deleted

## 2013-04-01 ENCOUNTER — Ambulatory Visit (HOSPITAL_BASED_OUTPATIENT_CLINIC_OR_DEPARTMENT_OTHER): Payer: Medicare Other | Admitting: Nurse Practitioner

## 2013-04-01 ENCOUNTER — Encounter: Payer: Self-pay | Admitting: Oncology

## 2013-04-01 ENCOUNTER — Ambulatory Visit: Payer: Medicare Other | Admitting: Nutrition

## 2013-04-01 ENCOUNTER — Other Ambulatory Visit (HOSPITAL_BASED_OUTPATIENT_CLINIC_OR_DEPARTMENT_OTHER): Payer: Medicare Other

## 2013-04-01 VITALS — BP 109/65 | HR 89 | Temp 97.8°F | Resp 21 | Ht 62.0 in | Wt 213.1 lb

## 2013-04-01 DIAGNOSIS — D510 Vitamin B12 deficiency anemia due to intrinsic factor deficiency: Secondary | ICD-10-CM | POA: Diagnosis not present

## 2013-04-01 DIAGNOSIS — D509 Iron deficiency anemia, unspecified: Secondary | ICD-10-CM | POA: Diagnosis not present

## 2013-04-01 DIAGNOSIS — C185 Malignant neoplasm of splenic flexure: Secondary | ICD-10-CM | POA: Diagnosis not present

## 2013-04-01 DIAGNOSIS — J45909 Unspecified asthma, uncomplicated: Secondary | ICD-10-CM | POA: Diagnosis not present

## 2013-04-01 DIAGNOSIS — C184 Malignant neoplasm of transverse colon: Secondary | ICD-10-CM

## 2013-04-01 LAB — CBC WITH DIFFERENTIAL/PLATELET
BASO%: 0.5 % (ref 0.0–2.0)
BASOS ABS: 0 10*3/uL (ref 0.0–0.1)
EOS ABS: 0.1 10*3/uL (ref 0.0–0.5)
EOS%: 0.9 % (ref 0.0–7.0)
HCT: 28.8 % — ABNORMAL LOW (ref 34.8–46.6)
HEMOGLOBIN: 8.9 g/dL — AB (ref 11.6–15.9)
LYMPH#: 2.2 10*3/uL (ref 0.9–3.3)
LYMPH%: 29.2 % (ref 14.0–49.7)
MCH: 20.3 pg — ABNORMAL LOW (ref 25.1–34.0)
MCHC: 30.9 g/dL — ABNORMAL LOW (ref 31.5–36.0)
MCV: 65.6 fL — AB (ref 79.5–101.0)
MONO#: 0.7 10*3/uL (ref 0.1–0.9)
MONO%: 8.6 % (ref 0.0–14.0)
NEUT%: 60.8 % (ref 38.4–76.8)
NEUTROS ABS: 4.7 10*3/uL (ref 1.5–6.5)
Platelets: 379 10*3/uL (ref 145–400)
RBC: 4.39 10*6/uL (ref 3.70–5.45)
RDW: 18.2 % — ABNORMAL HIGH (ref 11.2–14.5)
WBC: 7.7 10*3/uL (ref 3.9–10.3)

## 2013-04-01 NOTE — Progress Notes (Signed)
No treatment date in notes as of 04/01/13 I recommended she consider adjuvant treatment with capecitabine----- per Dr.
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
## 2013-04-01 NOTE — Telephone Encounter (Signed)
gv pt appt schedule for march °

## 2013-04-01 NOTE — Progress Notes (Addendum)
OFFICE PROGRESS NOTE  Interval history:  [name] returns for followup of colon cancer. She has returned home from the nursing facility. She is gaining strength. She is walking with a walker. Abdominal wound has nearly healed. Colostomy is functioning. Stools are dark since beginning oral iron.   Objective: Filed Vitals:   04/01/13 1455  BP: 109/65  Pulse: 89  Temp: 97.8 F (36.6 C)  Resp: 21   Oropharynx is without thrush or ulceration. Lungs clear. Regular cardiac rhythm. Abdomen soft and nontender. Right abdomen colostomy. Thickened liquid stool in the collection bag. Stool is dark. Small superficial opening at the upper portion of the midline wound. Trace lower leg edema bilaterally.   Lab Results: Lab Results  Component Value Date   WBC 7.7 04/01/2013   HGB 8.9* 04/01/2013   HCT 28.8* 04/01/2013   MCV 65.6* 04/01/2013   PLT 379 04/01/2013   NEUTROABS 4.7 04/01/2013    Chemistry:    Chemistry      Component Value Date/Time   NA 147 02/17/2013 0355   K 3.4* 02/17/2013 0355   CL 111 02/17/2013 0355   CO2 21 02/17/2013 0355   BUN 10 02/17/2013 0355   CREATININE 1.18* 02/17/2013 0355      Component Value Date/Time   CALCIUM 8.4 02/17/2013 0355   ALKPHOS 124* 02/09/2013 0400   AST 15 02/09/2013 0400   ALT 14 02/09/2013 0400   BILITOT 0.5 02/09/2013 0400       Studies/Results: No results found.  Medications: I have reviewed the patient's current medications.  Assessment/Plan: 1. 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   Presentation to the emergency room 02/02/2013 with a colonic obstruction secondary to tumor at the splenic flexure.  Baseline CEA on 02/02/2013 less than 0.5. 2. Iron deficiency  anemia. 3. Asthma. 4. Healing abdominal wound. Improved.  Disposition-she appears stable. She would like to proceed with adjuvant Xeloda. She has attended the chemotherapy education class. We again reviewed potential toxicities associated with Xeloda including mouth sores, nausea, diarrhea, hand-foot syndrome, skin rash, skin hyperpigmentation and conjunctivitis-like symptoms.  We anticipate she will begin cycle 1 around 04/07/2013. We will see her in followup on 04/22/2013. She will contact the office in the interim with any problems.   Patient seen with [name].   Ned Card ANP/GNP-BC   This was a shared visit with Ned Card. She has returned home and is ready to begin adjuvant chemotherapy. The plan is to begin capecitabine 04/07/2013.  Julieanne Manson, M.D.

## 2013-04-02 ENCOUNTER — Telehealth: Payer: Self-pay | Admitting: Oncology

## 2013-04-02 ENCOUNTER — Other Ambulatory Visit: Payer: Self-pay | Admitting: *Deleted

## 2013-04-02 ENCOUNTER — Encounter: Payer: Self-pay | Admitting: Oncology

## 2013-04-02 DIAGNOSIS — S31109A Unspecified open wound of abdominal wall, unspecified quadrant without penetration into peritoneal cavity, initial encounter: Secondary | ICD-10-CM | POA: Diagnosis not present

## 2013-04-02 DIAGNOSIS — C19 Malignant neoplasm of rectosigmoid junction: Secondary | ICD-10-CM | POA: Diagnosis not present

## 2013-04-02 DIAGNOSIS — I10 Essential (primary) hypertension: Secondary | ICD-10-CM | POA: Diagnosis not present

## 2013-04-02 DIAGNOSIS — Z433 Encounter for attention to colostomy: Secondary | ICD-10-CM | POA: Diagnosis not present

## 2013-04-02 DIAGNOSIS — M6281 Muscle weakness (generalized): Secondary | ICD-10-CM | POA: Diagnosis not present

## 2013-04-02 DIAGNOSIS — J45909 Unspecified asthma, uncomplicated: Secondary | ICD-10-CM | POA: Diagnosis not present

## 2013-04-02 MED ORDER — CAPECITABINE 500 MG PO TABS: 2000.0000 mg | ORAL_TABLET | Freq: Two times a day (BID) | ORAL | Status: DC

## 2013-04-02 NOTE — Progress Notes (Signed)
Faxed xeloda prescription to [facility] °

## 2013-04-02 NOTE — Addendum Note (Signed)
Addended by: Brien Few on: 04/02/2013 04:12 PM   Modules accepted: Orders

## 2013-04-02 NOTE — Telephone Encounter (Signed)
Called pt, will need CMET prior to beginning Xeloda. She voiced understanding. Would like lab scheduled in this office. Order to scheduler.

## 2013-04-02 NOTE — Telephone Encounter (Signed)
GAve pt appt for lab on 3/10

## 2013-04-06 ENCOUNTER — Other Ambulatory Visit (HOSPITAL_BASED_OUTPATIENT_CLINIC_OR_DEPARTMENT_OTHER): Payer: Medicare Other

## 2013-04-06 ENCOUNTER — Telehealth: Payer: Self-pay | Admitting: *Deleted

## 2013-04-06 ENCOUNTER — Ambulatory Visit (INDEPENDENT_AMBULATORY_CARE_PROVIDER_SITE_OTHER): Payer: Medicare Other

## 2013-04-06 DIAGNOSIS — C185 Malignant neoplasm of splenic flexure: Secondary | ICD-10-CM

## 2013-04-06 DIAGNOSIS — D509 Iron deficiency anemia, unspecified: Secondary | ICD-10-CM

## 2013-04-06 DIAGNOSIS — C184 Malignant neoplasm of transverse colon: Secondary | ICD-10-CM

## 2013-04-06 DIAGNOSIS — S31109A Unspecified open wound of abdominal wall, unspecified quadrant without penetration into peritoneal cavity, initial encounter: Secondary | ICD-10-CM | POA: Diagnosis not present

## 2013-04-06 DIAGNOSIS — C19 Malignant neoplasm of rectosigmoid junction: Secondary | ICD-10-CM | POA: Diagnosis not present

## 2013-04-06 DIAGNOSIS — J45909 Unspecified asthma, uncomplicated: Secondary | ICD-10-CM | POA: Diagnosis not present

## 2013-04-06 DIAGNOSIS — Z433 Encounter for attention to colostomy: Secondary | ICD-10-CM | POA: Diagnosis not present

## 2013-04-06 DIAGNOSIS — J309 Allergic rhinitis, unspecified: Secondary | ICD-10-CM | POA: Diagnosis not present

## 2013-04-06 DIAGNOSIS — M6281 Muscle weakness (generalized): Secondary | ICD-10-CM | POA: Diagnosis not present

## 2013-04-06 DIAGNOSIS — I10 Essential (primary) hypertension: Secondary | ICD-10-CM | POA: Diagnosis not present

## 2013-04-06 LAB — COMPREHENSIVE METABOLIC PANEL (CC13)
ALBUMIN: 2.9 g/dL — AB (ref 3.5–5.0)
ALT: 8 U/L (ref 0–55)
AST: 11 U/L (ref 5–34)
Alkaline Phosphatase: 109 U/L (ref 40–150)
Anion Gap: 8 mEq/L (ref 3–11)
BUN: 10.6 mg/dL (ref 7.0–26.0)
CO2: 23 meq/L (ref 22–29)
Calcium: 9.5 mg/dL (ref 8.4–10.4)
Chloride: 109 mEq/L (ref 98–109)
Creatinine: 0.8 mg/dL (ref 0.6–1.1)
Glucose: 102 mg/dl (ref 70–140)
Potassium: 3.7 mEq/L (ref 3.5–5.1)
SODIUM: 140 meq/L (ref 136–145)
Total Bilirubin: 0.26 mg/dL (ref 0.20–1.20)
Total Protein: 7.1 g/dL (ref 6.4–8.3)

## 2013-04-06 LAB — CBC WITH DIFFERENTIAL/PLATELET
BASO%: 1.3 % (ref 0.0–2.0)
Basophils Absolute: 0.1 10*3/uL (ref 0.0–0.1)
EOS ABS: 0.2 10*3/uL (ref 0.0–0.5)
EOS%: 3.1 % (ref 0.0–7.0)
HEMATOCRIT: 26.9 % — AB (ref 34.8–46.6)
HEMOGLOBIN: 8.4 g/dL — AB (ref 11.6–15.9)
LYMPH%: 28.1 % (ref 14.0–49.7)
MCH: 20.5 pg — ABNORMAL LOW (ref 25.1–34.0)
MCHC: 31 g/dL — ABNORMAL LOW (ref 31.5–36.0)
MCV: 66 fL — AB (ref 79.5–101.0)
MONO#: 0.8 10*3/uL (ref 0.1–0.9)
MONO%: 10.7 % (ref 0.0–14.0)
NEUT%: 56.8 % (ref 38.4–76.8)
NEUTROS ABS: 4.2 10*3/uL (ref 1.5–6.5)
PLATELETS: 341 10*3/uL (ref 145–400)
RBC: 4.08 10*6/uL (ref 3.70–5.45)
RDW: 19.3 % — ABNORMAL HIGH (ref 11.2–14.5)
WBC: 7.3 10*3/uL (ref 3.9–10.3)
lymph#: 2.1 10*3/uL (ref 0.9–3.3)

## 2013-04-06 NOTE — Telephone Encounter (Signed)
Called and confirmed with patient that she is taking ferrous sulfate twice daily with meal.  Per Elby Showers. Thomas.

## 2013-04-07 ENCOUNTER — Encounter: Payer: Self-pay | Admitting: Oncology

## 2013-04-07 ENCOUNTER — Telehealth: Payer: Self-pay | Admitting: *Deleted

## 2013-04-07 NOTE — Telephone Encounter (Signed)
Notified patient that script had to be re-faxed. Gave her phone # for [facility] to check on med in couple days. Told her to let representative know if her co pay is too high for her and request to have patient assistance program initiated.

## 2013-04-07 NOTE — Telephone Encounter (Signed)
Called to report she was told that her Xeloda script was forwarded to [facility] and gave nurse [phone] to call to follow up. Called [facility]-they have no record of this script yet. RN will call tomorrow to follow up.

## 2013-04-07 NOTE — Telephone Encounter (Signed)
Reports she has not heard from pharmacy about her Xeloda yet. Will follow up and let her know status of her script. Called [facility] and they have no record of the script being faxed on 04/02/13 as charted. Called Carmelina Noun in managed care and she will refax and mark as "rush".

## 2013-04-07 NOTE — Progress Notes (Signed)
[facility] called and needed card# info from [place] [employer] [place] for patient- can not read copy. I called and the patient could not find at 1st but then she did and I called [facility] back and gave info. Need asst with xeloda.

## 2013-04-08 ENCOUNTER — Telehealth: Payer: Self-pay | Admitting: *Deleted

## 2013-04-08 DIAGNOSIS — J45909 Unspecified asthma, uncomplicated: Secondary | ICD-10-CM | POA: Diagnosis not present

## 2013-04-08 DIAGNOSIS — M6281 Muscle weakness (generalized): Secondary | ICD-10-CM | POA: Diagnosis not present

## 2013-04-08 DIAGNOSIS — C19 Malignant neoplasm of rectosigmoid junction: Secondary | ICD-10-CM | POA: Diagnosis not present

## 2013-04-08 DIAGNOSIS — Z433 Encounter for attention to colostomy: Secondary | ICD-10-CM | POA: Diagnosis not present

## 2013-04-08 DIAGNOSIS — S31109A Unspecified open wound of abdominal wall, unspecified quadrant without penetration into peritoneal cavity, initial encounter: Secondary | ICD-10-CM | POA: Diagnosis not present

## 2013-04-08 DIAGNOSIS — I10 Essential (primary) hypertension: Secondary | ICD-10-CM | POA: Diagnosis not present

## 2013-04-08 NOTE — Telephone Encounter (Signed)
Confirmed with [name] that [facility] called her and she should receive her medication tomorrow. She will start therapy on 3/13 or 3/14. Reports that swelling has increased in her left foot compared to her right foot. Does improve at night. Denies any heat/reddness or pain. Instructed her to call if swelling begins to extend up her leg in to her calf or she has pain with ambulation. Was told by [facility] that she has qualified for a grant for her Xeloda and will require no co pay.

## 2013-04-08 NOTE — Telephone Encounter (Signed)
Spoke with [name] at [facility] : They are running her script through insurance verification after they had to confirm quantity of #112 per fill. She has no record of forwarding script to [facility] and currently sees no reason why they can't service the patient. She will put a rush on this script and contact patient. Will offer co pay assist if this is deemed necessary by the patient. [facility] will call after it is processed and patient is notified.

## 2013-04-09 ENCOUNTER — Telehealth: Payer: Self-pay | Admitting: *Deleted

## 2013-04-09 ENCOUNTER — Encounter: Payer: Self-pay | Admitting: Oncology

## 2013-04-09 DIAGNOSIS — I10 Essential (primary) hypertension: Secondary | ICD-10-CM | POA: Diagnosis not present

## 2013-04-09 DIAGNOSIS — Z433 Encounter for attention to colostomy: Secondary | ICD-10-CM | POA: Diagnosis not present

## 2013-04-09 DIAGNOSIS — C19 Malignant neoplasm of rectosigmoid junction: Secondary | ICD-10-CM | POA: Diagnosis not present

## 2013-04-09 DIAGNOSIS — S31109A Unspecified open wound of abdominal wall, unspecified quadrant without penetration into peritoneal cavity, initial encounter: Secondary | ICD-10-CM | POA: Diagnosis not present

## 2013-04-09 DIAGNOSIS — M6281 Muscle weakness (generalized): Secondary | ICD-10-CM | POA: Diagnosis not present

## 2013-04-09 DIAGNOSIS — J45909 Unspecified asthma, uncomplicated: Secondary | ICD-10-CM | POA: Diagnosis not present

## 2013-04-09 MED ORDER — FUROSEMIDE 20 MG PO TABS: 20.0000 mg | ORAL_TABLET | Freq: Every day | ORAL | Status: DC | PRN

## 2013-04-09 MED ORDER — POTASSIUM CHLORIDE ER 10 MEQ PO TBCR: 10.0000 meq | EXTENDED_RELEASE_TABLET | Freq: Every day | ORAL | Status: DC

## 2013-04-09 NOTE — Telephone Encounter (Signed)
Use Furosemide prn  w/KCl  ROV in 1 mo /BMET, CBC Thx

## 2013-04-09 NOTE — Telephone Encounter (Signed)
Phoned and notified patient of MD med orders and instructions for f/u OV and labs.  Patient verbalized understanding & appreciation.

## 2013-04-09 NOTE — Telephone Encounter (Signed)
VM that PCP has put her on Lasix for her legs and feet swelling. Asking if OK with [name]? Called back and made her this is OK via VM

## 2013-04-09 NOTE — Telephone Encounter (Signed)
[facility] called to inquire about insurance coverage for her Xeloda. Made them aware she is getting chemo thru [facility] and has been approved for grant for no copy.

## 2013-04-09 NOTE — Telephone Encounter (Signed)
DISCUSSED THE CAPECITABINE DIRECTIONS WITH PT. SHE WILL START HER CAPECITABINE ON 04/10/12.

## 2013-04-09 NOTE — Telephone Encounter (Signed)
Patient phoned stating that since bp med has been d/c'ed, her lower extremities are swelling and "big".  OV for follow up?  Please advise.  CB# [phone]

## 2013-04-09 NOTE — Progress Notes (Signed)
Per PAN patient approved for Xeloda 04/08/13-04/08/14  7500.00. I will send to billing and medical records.

## 2013-04-12 ENCOUNTER — Ambulatory Visit: Payer: Medicare Other

## 2013-04-13 ENCOUNTER — Ambulatory Visit (INDEPENDENT_AMBULATORY_CARE_PROVIDER_SITE_OTHER): Payer: Medicare Other

## 2013-04-13 DIAGNOSIS — J309 Allergic rhinitis, unspecified: Secondary | ICD-10-CM | POA: Diagnosis not present

## 2013-04-14 ENCOUNTER — Telehealth: Payer: Self-pay

## 2013-04-14 ENCOUNTER — Telehealth (INDEPENDENT_AMBULATORY_CARE_PROVIDER_SITE_OTHER): Payer: Self-pay | Admitting: General Surgery

## 2013-04-14 NOTE — Telephone Encounter (Signed)
Pt called to be sure its okay to (1) drive a little now, (2) shower, and (3) get her hair washed at the beauty shop.  She is home from rehab finally.  Surgery was in January.  Gave her the OK to shower and get her hair done.  Can drive as long as she is no longer taking any narcotics and must be able to wear her seatbelt.  She understands all.

## 2013-04-14 NOTE — Telephone Encounter (Signed)
The patient called and stated her left leg is swelling.  I asked the patient if she would come in for an ov, but she stated she did not want to come in. She is hoping to get advice on if she needed to increase her lasix prescription and use the ted hose.    Callback - [phone]

## 2013-04-15 DIAGNOSIS — C19 Malignant neoplasm of rectosigmoid junction: Secondary | ICD-10-CM | POA: Diagnosis not present

## 2013-04-15 DIAGNOSIS — M6281 Muscle weakness (generalized): Secondary | ICD-10-CM

## 2013-04-15 DIAGNOSIS — Z433 Encounter for attention to colostomy: Secondary | ICD-10-CM | POA: Diagnosis not present

## 2013-04-15 DIAGNOSIS — I10 Essential (primary) hypertension: Secondary | ICD-10-CM | POA: Diagnosis not present

## 2013-04-15 DIAGNOSIS — J45909 Unspecified asthma, uncomplicated: Secondary | ICD-10-CM | POA: Diagnosis not present

## 2013-04-15 DIAGNOSIS — S31109A Unspecified open wound of abdominal wall, unspecified quadrant without penetration into peritoneal cavity, initial encounter: Secondary | ICD-10-CM | POA: Diagnosis not present

## 2013-04-15 NOTE — Telephone Encounter (Signed)
Please advise 

## 2013-04-16 NOTE — Telephone Encounter (Signed)
Discussed with pt. She states that the swelling has gone down in her leg & the pain has decreased.

## 2013-04-16 NOTE — Telephone Encounter (Signed)
Elevate leg If the swelling is bad or painful: OV today or go to ER Thx

## 2013-04-21 ENCOUNTER — Other Ambulatory Visit: Payer: Self-pay | Admitting: Nurse Practitioner

## 2013-04-21 DIAGNOSIS — C184 Malignant neoplasm of transverse colon: Secondary | ICD-10-CM

## 2013-04-22 ENCOUNTER — Ambulatory Visit (INDEPENDENT_AMBULATORY_CARE_PROVIDER_SITE_OTHER): Payer: Medicare Other

## 2013-04-22 ENCOUNTER — Other Ambulatory Visit: Payer: Self-pay | Admitting: *Deleted

## 2013-04-22 ENCOUNTER — Ambulatory Visit (HOSPITAL_BASED_OUTPATIENT_CLINIC_OR_DEPARTMENT_OTHER): Payer: Medicare Other | Admitting: Nurse Practitioner

## 2013-04-22 ENCOUNTER — Other Ambulatory Visit (HOSPITAL_BASED_OUTPATIENT_CLINIC_OR_DEPARTMENT_OTHER): Payer: Medicare Other

## 2013-04-22 ENCOUNTER — Telehealth: Payer: Self-pay | Admitting: Oncology

## 2013-04-22 VITALS — BP 118/79 | HR 86 | Temp 97.9°F | Resp 18 | Ht 62.0 in | Wt 219.6 lb

## 2013-04-22 DIAGNOSIS — J309 Allergic rhinitis, unspecified: Secondary | ICD-10-CM | POA: Diagnosis not present

## 2013-04-22 DIAGNOSIS — D509 Iron deficiency anemia, unspecified: Secondary | ICD-10-CM | POA: Diagnosis not present

## 2013-04-22 DIAGNOSIS — C184 Malignant neoplasm of transverse colon: Secondary | ICD-10-CM

## 2013-04-22 DIAGNOSIS — C185 Malignant neoplasm of splenic flexure: Secondary | ICD-10-CM

## 2013-04-22 LAB — COMPREHENSIVE METABOLIC PANEL (CC13)
ALBUMIN: 3.2 g/dL — AB (ref 3.5–5.0)
ALT: 8 U/L (ref 0–55)
ANION GAP: 11 meq/L (ref 3–11)
AST: 11 U/L (ref 5–34)
Alkaline Phosphatase: 116 U/L (ref 40–150)
BUN: 12.3 mg/dL (ref 7.0–26.0)
CO2: 24 mEq/L (ref 22–29)
Calcium: 9.6 mg/dL (ref 8.4–10.4)
Chloride: 107 mEq/L (ref 98–109)
Creatinine: 0.8 mg/dL (ref 0.6–1.1)
Glucose: 97 mg/dl (ref 70–140)
Potassium: 3.7 mEq/L (ref 3.5–5.1)
SODIUM: 142 meq/L (ref 136–145)
TOTAL PROTEIN: 7.4 g/dL (ref 6.4–8.3)
Total Bilirubin: 0.38 mg/dL (ref 0.20–1.20)

## 2013-04-22 LAB — CBC WITH DIFFERENTIAL/PLATELET
BASO%: 1.1 % (ref 0.0–2.0)
Basophils Absolute: 0.1 10*3/uL (ref 0.0–0.1)
EOS ABS: 0.2 10*3/uL (ref 0.0–0.5)
EOS%: 2.9 % (ref 0.0–7.0)
HEMATOCRIT: 29.5 % — AB (ref 34.8–46.6)
HGB: 9.2 g/dL — ABNORMAL LOW (ref 11.6–15.9)
LYMPH%: 29.4 % (ref 14.0–49.7)
MCH: 21.2 pg — ABNORMAL LOW (ref 25.1–34.0)
MCHC: 31 g/dL — AB (ref 31.5–36.0)
MCV: 68.4 fL — ABNORMAL LOW (ref 79.5–101.0)
MONO#: 0.8 10*3/uL (ref 0.1–0.9)
MONO%: 12.3 % (ref 0.0–14.0)
NEUT%: 54.3 % (ref 38.4–76.8)
NEUTROS ABS: 3.5 10*3/uL (ref 1.5–6.5)
Platelets: 366 10*3/uL (ref 145–400)
RBC: 4.31 10*6/uL (ref 3.70–5.45)
RDW: 21.7 % — ABNORMAL HIGH (ref 11.2–14.5)
WBC: 6.4 10*3/uL (ref 3.9–10.3)
lymph#: 1.9 10*3/uL (ref 0.9–3.3)

## 2013-04-22 MED ORDER — CAPECITABINE 500 MG PO TABS: 2000.0000 mg | ORAL_TABLET | Freq: Two times a day (BID) | ORAL | Status: DC

## 2013-04-22 NOTE — Progress Notes (Signed)
  [place] OFFICE PROGRESS NOTE   Diagnosis:  Colon cancer.  INTERVAL HISTORY:   [name] returns for followup of colon cancer. She began cycle 1 adjuvant Xeloda beginning 04/10/2013. She denies nausea/vomiting. No mouth sores. No diarrhea. No hand or foot pain or redness. She reports a good energy level. Her appetite varies. She denies abdominal pain. Leg swelling is better since beginning Lasix.  Objective:  Vital signs in last 24 hours:  Blood pressure 118/79, pulse 86, temperature 97.9 F (36.6 C), temperature source Oral, resp. rate 18, height _0  (1.575 m), weight 219 lb 9.6 oz (99.61 kg), SpO2 98.00%.    HEENT: No thrush or ulcerations. Resp: Lungs clear. Cardio: Regular cardiac rhythm. GI: Abdomen soft and nontender. No hepatomegaly. Right abdomen colostomy. Thick liquid stool in the collection bag. Vascular: Trace lower leg edema bilaterally.  Skin: Palms without erythema.    Lab Results:  Lab Results  Component Value Date   WBC 6.4 04/22/2013   HGB 9.2* 04/22/2013   HCT 29.5* 04/22/2013   MCV 68.4* 04/22/2013   PLT 366 04/22/2013   NEUTROABS 3.5 04/22/2013    Lab Results  Component Value Date   NA 142 04/22/2013    Lab Results  Component Value Date   CEA <0.5 02/02/2013    Imaging:  No results found.  Medications: I have reviewed the patient's current medications.  Assessment/Plan:  1. 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  Presentation to the emergency room 02/02/2013 with a colonic obstruction secondary to tumor at the splenic flexure.  Baseline CEA on 02/02/2013 less than 0.5. Initiation of adjuvant Xeloda 04/10/2013. 2. Iron deficiency anemia. She continues oral  iron. 3. Asthma. 4. Healing abdominal wound. Improved.   Disposition:  She appears stable. She is completing cycle 1 adjuvant Xeloda. She will begin cycle 2 on 05/01/2013. We will see her in followup on 05/14/2013. She will contact the office in the interim with any problems.  Plan reviewed with [name].  Ned Card ANP/GNP-BC   04/22/2013  11:32 AM

## 2013-04-22 NOTE — Telephone Encounter (Signed)
Called patient to confirm pharmacy for Xeloda prescription.  Patient stated she receives prescription from [facility].  Start Date of 05/01/13 per [name]

## 2013-04-22 NOTE — Telephone Encounter (Signed)
gv adn printed appts sched adn avs for pt for April adn May

## 2013-04-23 NOTE — Telephone Encounter (Signed)
RECEIVED A FAX FROM [facility] CONCERNING A CONFIRMATION OF FACSIMILE RECEIPT FOR PT. REFERRAL. 

## 2013-04-29 NOTE — Telephone Encounter (Signed)
RECEIVED A FAX FROM [facility] CONCERNING A CONFIRMATION OF PRESCRIPTION SHIPMENT FOR CAPECITABINE ON 04/28/13. 

## 2013-05-03 ENCOUNTER — Ambulatory Visit (INDEPENDENT_AMBULATORY_CARE_PROVIDER_SITE_OTHER): Payer: Medicare Other

## 2013-05-03 DIAGNOSIS — J309 Allergic rhinitis, unspecified: Secondary | ICD-10-CM | POA: Diagnosis not present

## 2013-05-14 ENCOUNTER — Other Ambulatory Visit (HOSPITAL_BASED_OUTPATIENT_CLINIC_OR_DEPARTMENT_OTHER): Payer: Medicare Other

## 2013-05-14 ENCOUNTER — Telehealth: Payer: Self-pay | Admitting: Oncology

## 2013-05-14 ENCOUNTER — Encounter: Payer: Self-pay | Admitting: Internal Medicine

## 2013-05-14 ENCOUNTER — Other Ambulatory Visit: Payer: Self-pay | Admitting: *Deleted

## 2013-05-14 ENCOUNTER — Ambulatory Visit (HOSPITAL_BASED_OUTPATIENT_CLINIC_OR_DEPARTMENT_OTHER): Payer: Medicare Other | Admitting: Nurse Practitioner

## 2013-05-14 ENCOUNTER — Ambulatory Visit (INDEPENDENT_AMBULATORY_CARE_PROVIDER_SITE_OTHER): Payer: Medicare Other

## 2013-05-14 ENCOUNTER — Ambulatory Visit (INDEPENDENT_AMBULATORY_CARE_PROVIDER_SITE_OTHER): Payer: Medicare Other | Admitting: Internal Medicine

## 2013-05-14 VITALS — BP 130/58 | HR 52 | Temp 98.1°F | Resp 19 | Ht 62.0 in | Wt 221.6 lb

## 2013-05-14 VITALS — BP 120/62 | HR 68 | Temp 97.5°F | Resp 16 | Wt 221.0 lb

## 2013-05-14 DIAGNOSIS — E669 Obesity, unspecified: Secondary | ICD-10-CM

## 2013-05-14 DIAGNOSIS — C184 Malignant neoplasm of transverse colon: Secondary | ICD-10-CM

## 2013-05-14 DIAGNOSIS — C185 Malignant neoplasm of splenic flexure: Secondary | ICD-10-CM

## 2013-05-14 DIAGNOSIS — J45909 Unspecified asthma, uncomplicated: Secondary | ICD-10-CM

## 2013-05-14 DIAGNOSIS — J309 Allergic rhinitis, unspecified: Secondary | ICD-10-CM | POA: Diagnosis not present

## 2013-05-14 DIAGNOSIS — D509 Iron deficiency anemia, unspecified: Secondary | ICD-10-CM

## 2013-05-14 DIAGNOSIS — I10 Essential (primary) hypertension: Secondary | ICD-10-CM

## 2013-05-14 LAB — CBC WITH DIFFERENTIAL/PLATELET
BASO%: 0.3 % (ref 0.0–2.0)
Basophils Absolute: 0 10*3/uL (ref 0.0–0.1)
EOS%: 1.9 % (ref 0.0–7.0)
Eosinophils Absolute: 0.1 10*3/uL (ref 0.0–0.5)
HCT: 32.4 % — ABNORMAL LOW (ref 34.8–46.6)
HGB: 10 g/dL — ABNORMAL LOW (ref 11.6–15.9)
LYMPH#: 2 10*3/uL (ref 0.9–3.3)
LYMPH%: 33.1 % (ref 14.0–49.7)
MCH: 22.1 pg — AB (ref 25.1–34.0)
MCHC: 30.9 g/dL — AB (ref 31.5–36.0)
MCV: 71.6 fL — ABNORMAL LOW (ref 79.5–101.0)
MONO#: 0.7 10*3/uL (ref 0.1–0.9)
MONO%: 12 % (ref 0.0–14.0)
NEUT#: 3.2 10*3/uL (ref 1.5–6.5)
NEUT%: 52.7 % (ref 38.4–76.8)
Platelets: 288 10*3/uL (ref 145–400)
RBC: 4.53 10*6/uL (ref 3.70–5.45)
RDW: 25.7 % — AB (ref 11.2–14.5)
WBC: 6.1 10*3/uL (ref 3.9–10.3)

## 2013-05-14 LAB — COMPREHENSIVE METABOLIC PANEL (CC13)
ALT: 11 U/L (ref 0–55)
AST: 16 U/L (ref 5–34)
Albumin: 3.4 g/dL — ABNORMAL LOW (ref 3.5–5.0)
Alkaline Phosphatase: 133 U/L (ref 40–150)
Anion Gap: 9 mEq/L (ref 3–11)
BUN: 15.9 mg/dL (ref 7.0–26.0)
CALCIUM: 9.6 mg/dL (ref 8.4–10.4)
CHLORIDE: 109 meq/L (ref 98–109)
CO2: 25 mEq/L (ref 22–29)
Creatinine: 1 mg/dL (ref 0.6–1.1)
Glucose: 91 mg/dl (ref 70–140)
POTASSIUM: 3.9 meq/L (ref 3.5–5.1)
SODIUM: 143 meq/L (ref 136–145)
Total Bilirubin: 0.57 mg/dL (ref 0.20–1.20)
Total Protein: 7.3 g/dL (ref 6.4–8.3)

## 2013-05-14 MED ORDER — BENZONATATE 200 MG PO CAPS: 200.0000 mg | ORAL_CAPSULE | Freq: Two times a day (BID) | ORAL | Status: DC | PRN

## 2013-05-14 MED ORDER — HYDROCODONE-ACETAMINOPHEN 5-325 MG PO TABS: ORAL_TABLET | ORAL | Status: DC

## 2013-05-14 MED ORDER — CAPECITABINE 500 MG PO TABS: 2000.0000 mg | ORAL_TABLET | Freq: Two times a day (BID) | ORAL | Status: DC

## 2013-05-14 NOTE — Progress Notes (Signed)
Subjective:  F/u colon ca - feeling better. On Chemo po  F/u  R knee pain - may need a TKR per Dr Marlou Sa   Hypertension This is a chronic problem. The current episode started more than 1 year ago. The problem is unchanged. Pertinent negatives include no headaches, neck pain, palpitations or shortness of breath. There is no history of kidney disease.   The patient presents for a follow-up of  chronic GERD, asthma controlled with medicines  BP Readings from Last 3 Encounters:  05/14/13 130/58  04/22/13 118/79  04/01/13 109/65   Wt Readings from Last 3 Encounters:  05/14/13 221 lb 9.6 oz (100.517 kg)  04/22/13 219 lb 9.6 oz (99.61 kg)  04/01/13 213 lb 1.6 oz (96.662 kg)    Review of Systems  Constitutional: Negative.  Negative for fever, chills, diaphoresis, activity change, appetite change and unexpected weight change.  HENT: Negative for congestion, ear pain, facial swelling, hearing loss, mouth sores, nosebleeds, postnasal drip, rhinorrhea, sinus pressure, sneezing, sore throat, tinnitus and trouble swallowing.   Eyes: Negative for pain, discharge, redness, itching and visual disturbance.  Respiratory: Negative for cough, chest tightness, shortness of breath, wheezing and stridor.   Cardiovascular: Negative for palpitations and leg swelling.  Gastrointestinal: Negative for nausea, diarrhea, constipation, blood in stool, abdominal distention, anal bleeding and rectal pain.  Genitourinary: Negative for dysuria, urgency, frequency, hematuria, flank pain, vaginal bleeding, vaginal discharge, difficulty urinating, genital sores and pelvic pain.  Musculoskeletal: Negative for arthralgias, back pain, gait problem, joint swelling, neck pain and neck stiffness.       L arm in the biceps area as above  Skin: Negative.  Negative for rash.  Neurological: Negative for dizziness, tremors, seizures, syncope, speech difficulty, weakness and headaches.  Hematological: Negative for adenopathy. Does  not bruise/bleed easily.  Psychiatric/Behavioral: Negative for suicidal ideas, behavioral problems, sleep disturbance, dysphoric mood and decreased concentration. The patient is not nervous/anxious.        Objective:   Physical Exam  Constitutional: She appears well-developed. No distress.  Obese  HENT:  Head: Normocephalic.  Right Ear: External ear normal.  Left Ear: External ear normal.  Nose: Nose normal.  Mouth/Throat: Oropharynx is clear and moist.  Eyes: Conjunctivae are normal. Pupils are equal, round, and reactive to light. Right eye exhibits no discharge. Left eye exhibits no discharge.  Neck: Normal range of motion. Neck supple. No JVD present. No tracheal deviation present. No thyromegaly present.  Cardiovascular: Normal rate, regular rhythm and normal heart sounds.   Pulmonary/Chest: No stridor. No respiratory distress. She has no wheezes.  Abdominal: Soft. Bowel sounds are normal. She exhibits no distension and no mass. There is no tenderness. There is no rebound and no guarding.  Musculoskeletal: She exhibits no edema and no tenderness.  Lymphadenopathy:    She has no cervical adenopathy.  Neurological: She displays normal reflexes. No cranial nerve deficit. She exhibits normal muscle tone. Coordination normal.  Skin: No rash noted. No erythema.  Psychiatric: She has a normal mood and affect. Her behavior is normal. Judgment and thought content normal.  cane  Abd scars and R colostomy  Lab Results  Component Value Date   WBC 6.1 05/14/2013   HGB 10.0* 05/14/2013   HCT 32.4* 05/14/2013   PLT 288 05/14/2013   GLUCOSE 91 05/14/2013   CHOL 144 07/14/2012   TRIG 59.0 07/14/2012   HDL 76.60 07/14/2012   LDLCALC 56 07/14/2012   ALT 11 05/14/2013   AST 16 05/14/2013  NA 143 05/14/2013   K 3.9 05/14/2013   CL 111 02/17/2013   CREATININE 1.0 05/14/2013   BUN 15.9 05/14/2013   CO2 25 05/14/2013   TSH 1.91 01/27/2013   INR 1.07 02/02/2013   HGBA1C 6.2 09/07/2009          Assessment & Plan:

## 2013-05-14 NOTE — Assessment & Plan Note (Signed)
BP Readings from Last 3 Encounters:  05/14/13 130/58  04/22/13 118/79  04/01/13 109/65

## 2013-05-14 NOTE — Progress Notes (Signed)
Pre visit review using our clinic review tool, if applicable. No additional management support is needed unless otherwise documented below in the visit note. 

## 2013-05-14 NOTE — Assessment & Plan Note (Signed)
Per Dr Benay Spice

## 2013-05-14 NOTE — Telephone Encounter (Signed)
RECEIVED A FAX FROM [facility] CONCERNING A CONFIRMATION OF FACSIMILE RECEIPT FOR PT. REFERRAL. 

## 2013-05-14 NOTE — Telephone Encounter (Signed)
THIS REFILL REQUEST FOR CAPECITABINE WAS GIVEN TO DR.SHERRILL'S NURSE, TANYA WHITLOCK,RN. 

## 2013-05-14 NOTE — Progress Notes (Signed)
  [place] OFFICE PROGRESS NOTE   Diagnosis:  Colon cancer.  INTERVAL HISTORY:   She returns as scheduled. She began cycle 2 adjuvant Xeloda on 05/01/2013. She denies nausea/vomiting. No mouth sores. No diarrhea. No hand or foot pain or redness. She has noted some swelling of the left foot over the past 2-3 days. She denies calf pain. She has had swelling of the left foot intermittently in the past.  Objective:  Vital signs in last 24 hours:  Blood pressure 130/58, pulse 52, temperature 98.1 F (36.7 C), temperature source Oral, resp. rate 19, height _0  (1.575 m), weight 221 lb 9.6 oz (100.517 kg).    HEENT: No thrush or ulcerations. Resp: Lungs clear. Cardio: Regular cardiac rhythm. GI: Abdomen soft and nontender. No hepatomegaly. Right abdomen colostomy. Vascular: Trace lower leg edema bilaterally. Dorsal aspect left foot appears mildly edematous. Calves nontender. No erythema. Skin: Palms and soles with mild erythema. No skin breakdown.    Lab Results:  Lab Results  Component Value Date   WBC 6.1 05/14/2013   HGB 10.0* 05/14/2013   HCT 32.4* 05/14/2013   MCV 71.6* 05/14/2013   PLT 288 05/14/2013   NEUTROABS 3.2 05/14/2013    Imaging:  No results found.  Medications: I have reviewed the patient's current medications.  Assessment/Plan: 1. 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  Presentation to the emergency room 02/02/2013 with a colonic obstruction secondary to tumor at the splenic flexure.  Baseline CEA on 02/02/2013 less than 0.5.  Initiation of adjuvant Xeloda 04/10/2013. 2. Iron deficiency anemia. She continues oral iron. 3. Asthma.   Disposition: She appears stable. She is completing  cycle 2 adjuvant Xeloda. She will begin cycle 3 on 05/22/2013. She is scheduled to return for followup on 06/04/2013. She will contact the office in the interim with any problems. We specifically discussed progressive edema of the left leg, erythema or pain.    Owens Shark ANP/GNP-BC   05/14/2013  12:35 PM

## 2013-05-14 NOTE — Assessment & Plan Note (Signed)
Doing well 

## 2013-05-14 NOTE — Assessment & Plan Note (Signed)
Wt Readings from Last 3 Encounters:  05/14/13 221 lb (100.245 kg)  05/14/13 221 lb 9.6 oz (100.517 kg)  04/22/13 219 lb 9.6 oz (99.61 kg)

## 2013-05-14 NOTE — Telephone Encounter (Signed)
gave pt appt calendar for May 2015

## 2013-05-15 ENCOUNTER — Telehealth: Payer: Self-pay | Admitting: *Deleted

## 2013-05-15 DIAGNOSIS — R05 Cough: Secondary | ICD-10-CM

## 2013-05-15 DIAGNOSIS — R059 Cough, unspecified: Secondary | ICD-10-CM

## 2013-05-15 MED ORDER — PROMETHAZINE-CODEINE 6.25-10 MG/5ML PO SYRP: ORAL_SOLUTION | ORAL | Status: DC

## 2013-05-15 NOTE — Addendum Note (Signed)
Addended by: Lucille Passy on: 05/15/2013 12:57 PM   Modules accepted: Orders

## 2013-05-15 NOTE — Telephone Encounter (Signed)
Closed in error-see below

## 2013-05-15 NOTE — Telephone Encounter (Signed)
Yes ok to call in rx as entered.

## 2013-05-15 NOTE — Telephone Encounter (Signed)
Received a call from [facility] stating that the Ladona Ridgel that were Rx'd for patient yesterday by Dr. Alain Marion was too expensive. Wanted to know if there was a cheaper alternative to them. I spoke with Dr. Cherrie Gauze while at [facility] clinic & she states that all Rx cough medications are pricey. The best/cheapest solution would be Delsym or Tussin OTC.

## 2013-05-15 NOTE — Telephone Encounter (Signed)
Pt states that she has taken the Promethazine cough syrup before that helped & only cost $14

## 2013-05-15 NOTE — Telephone Encounter (Signed)
Rx was phoned to pharmacy & pt notified

## 2013-05-19 ENCOUNTER — Encounter: Payer: Self-pay | Admitting: *Deleted

## 2013-05-20 NOTE — Telephone Encounter (Signed)
RECEIVED A FAX FROM [facility] CONCERNING A CONFIRMATION OF PRESCRIPTION SHIPMENT FOR CAPECITABINE ON 05/19/13. 

## 2013-05-25 ENCOUNTER — Ambulatory Visit (INDEPENDENT_AMBULATORY_CARE_PROVIDER_SITE_OTHER): Payer: Medicare Other

## 2013-05-25 DIAGNOSIS — J309 Allergic rhinitis, unspecified: Secondary | ICD-10-CM | POA: Diagnosis not present

## 2013-06-04 ENCOUNTER — Other Ambulatory Visit: Payer: Self-pay | Admitting: *Deleted

## 2013-06-04 ENCOUNTER — Other Ambulatory Visit (HOSPITAL_BASED_OUTPATIENT_CLINIC_OR_DEPARTMENT_OTHER): Payer: Medicare Other

## 2013-06-04 ENCOUNTER — Telehealth: Payer: Self-pay | Admitting: Oncology

## 2013-06-04 ENCOUNTER — Ambulatory Visit (INDEPENDENT_AMBULATORY_CARE_PROVIDER_SITE_OTHER): Payer: Medicare Other

## 2013-06-04 ENCOUNTER — Ambulatory Visit (HOSPITAL_BASED_OUTPATIENT_CLINIC_OR_DEPARTMENT_OTHER): Payer: Medicare Other | Admitting: Oncology

## 2013-06-04 VITALS — BP 123/64 | HR 60 | Temp 97.8°F | Resp 18 | Ht 62.0 in | Wt 221.9 lb

## 2013-06-04 DIAGNOSIS — D509 Iron deficiency anemia, unspecified: Secondary | ICD-10-CM

## 2013-06-04 DIAGNOSIS — C184 Malignant neoplasm of transverse colon: Secondary | ICD-10-CM

## 2013-06-04 DIAGNOSIS — J309 Allergic rhinitis, unspecified: Secondary | ICD-10-CM

## 2013-06-04 DIAGNOSIS — J45909 Unspecified asthma, uncomplicated: Secondary | ICD-10-CM

## 2013-06-04 LAB — CBC WITH DIFFERENTIAL/PLATELET
BASO%: 1 % (ref 0.0–2.0)
Basophils Absolute: 0.1 10*3/uL (ref 0.0–0.1)
EOS%: 2.3 % (ref 0.0–7.0)
Eosinophils Absolute: 0.1 10*3/uL (ref 0.0–0.5)
HEMATOCRIT: 35.3 % (ref 34.8–46.6)
HGB: 10.9 g/dL — ABNORMAL LOW (ref 11.6–15.9)
LYMPH%: 29.7 % (ref 14.0–49.7)
MCH: 22.9 pg — AB (ref 25.1–34.0)
MCHC: 30.9 g/dL — AB (ref 31.5–36.0)
MCV: 74.1 fL — AB (ref 79.5–101.0)
MONO#: 0.7 10*3/uL (ref 0.1–0.9)
MONO%: 10.8 % (ref 0.0–14.0)
NEUT#: 3.6 10*3/uL (ref 1.5–6.5)
NEUT%: 56.2 % (ref 38.4–76.8)
PLATELETS: 244 10*3/uL (ref 145–400)
RBC: 4.77 10*6/uL (ref 3.70–5.45)
RDW: 27.4 % — ABNORMAL HIGH (ref 11.2–14.5)
WBC: 6.5 10*3/uL (ref 3.9–10.3)
lymph#: 1.9 10*3/uL (ref 0.9–3.3)

## 2013-06-04 LAB — COMPREHENSIVE METABOLIC PANEL (CC13)
ALK PHOS: 144 U/L (ref 40–150)
ALT: 15 U/L (ref 0–55)
ANION GAP: 9 meq/L (ref 3–11)
AST: 17 U/L (ref 5–34)
Albumin: 3.5 g/dL (ref 3.5–5.0)
BILIRUBIN TOTAL: 0.43 mg/dL (ref 0.20–1.20)
BUN: 14.9 mg/dL (ref 7.0–26.0)
CO2: 26 meq/L (ref 22–29)
CREATININE: 1 mg/dL (ref 0.6–1.1)
Calcium: 9.7 mg/dL (ref 8.4–10.4)
Chloride: 107 mEq/L (ref 98–109)
Glucose: 102 mg/dl (ref 70–140)
Potassium: 3.8 mEq/L (ref 3.5–5.1)
Sodium: 142 mEq/L (ref 136–145)
Total Protein: 7.3 g/dL (ref 6.4–8.3)

## 2013-06-04 MED ORDER — CAPECITABINE 500 MG PO TABS: 2000.0000 mg | ORAL_TABLET | Freq: Two times a day (BID) | ORAL | Status: DC

## 2013-06-04 NOTE — Progress Notes (Signed)
  [name] OFFICE PROGRESS NOTE   Diagnosis: Colon cancer  INTERVAL HISTORY:   She will complete a third cycle of Xeloda today. No mouth sores, nausea, or diarrhea. Mild erythema at the soles. No pain. She reports erythema beneath the ostomy wafer.  Objective:  Vital signs in last 24 hours:  Blood pressure 123/64, pulse 60, temperature 97.8 F (36.6 C), temperature source Oral, resp. rate 18, height $RemoveBe'5\' 2"'YKmhwqHgX$  (1.575 m), weight 221 lb 14.4 oz (100.653 kg), SpO2 99.00%.    HEENT: No thrush or ulcers Resp: Lungs clear bilaterally Cardio: Regular rate and rhythm GI: No hepatomegaly, right lower quadrant colostomy with Owens Shark stool Vascular: No leg edema  Skin: Mild erythema and hyperpigmentation at the palms and soles    Lab Results:  Lab Results  Component Value Date   WBC 6.5 06/04/2013   HGB 10.9* 06/04/2013   HCT 35.3 06/04/2013   MCV 74.1* 06/04/2013   PLT 244 06/04/2013   NEUTROABS 3.6 06/04/2013    Medications: I have reviewed the patient's current medications.  Assessment/Plan: 1. 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  Presentation to the emergency room 02/02/2013 with a colonic obstruction secondary to tumor at the splenic flexure.  Baseline CEA on 02/02/2013 less than 0.5.  Initiation of adjuvant Xeloda 04/10/2013. 2. Iron deficiency anemia. She continues oral iron. 3. Asthma.   Disposition:  She has completed 3 cycles of adjuvant Xeloda. She is tolerating the Xeloda well. The plan is to proceed with cycle 4 06/12/2013. [name] will return for an office visit 06/28/2013.  We will arrange for an appointment with the ostomy nurse.  Ladell Pier, MD  06/04/2013  12:18 PM

## 2013-06-04 NOTE — Telephone Encounter (Signed)
gv adn printed appt sched and avs for pt for June °

## 2013-06-10 ENCOUNTER — Ambulatory Visit (INDEPENDENT_AMBULATORY_CARE_PROVIDER_SITE_OTHER): Payer: Medicare Other

## 2013-06-10 DIAGNOSIS — J309 Allergic rhinitis, unspecified: Secondary | ICD-10-CM

## 2013-06-10 NOTE — Telephone Encounter (Signed)
RECEIVED A FAX FROM [facility] CONCERNING A CONFIRMATION OF PRESCRIPTION SHIPMENT FOR CAPECITABINE ON 06/09/13.

## 2013-06-18 ENCOUNTER — Encounter: Payer: Self-pay | Admitting: Internal Medicine

## 2013-06-18 ENCOUNTER — Ambulatory Visit: Payer: Medicare Other | Admitting: Internal Medicine

## 2013-06-18 ENCOUNTER — Ambulatory Visit (INDEPENDENT_AMBULATORY_CARE_PROVIDER_SITE_OTHER): Payer: Medicare Other

## 2013-06-18 ENCOUNTER — Telehealth (INDEPENDENT_AMBULATORY_CARE_PROVIDER_SITE_OTHER): Payer: Self-pay

## 2013-06-18 ENCOUNTER — Ambulatory Visit (INDEPENDENT_AMBULATORY_CARE_PROVIDER_SITE_OTHER): Payer: Medicare Other | Admitting: Internal Medicine

## 2013-06-18 VITALS — BP 120/80 | HR 76 | Temp 98.2°F | Resp 16 | Wt 223.0 lb

## 2013-06-18 DIAGNOSIS — I10 Essential (primary) hypertension: Secondary | ICD-10-CM

## 2013-06-18 DIAGNOSIS — M79609 Pain in unspecified limb: Secondary | ICD-10-CM

## 2013-06-18 DIAGNOSIS — J309 Allergic rhinitis, unspecified: Secondary | ICD-10-CM | POA: Diagnosis not present

## 2013-06-18 DIAGNOSIS — R7309 Other abnormal glucose: Secondary | ICD-10-CM

## 2013-06-18 DIAGNOSIS — C184 Malignant neoplasm of transverse colon: Secondary | ICD-10-CM

## 2013-06-18 DIAGNOSIS — M79671 Pain in right foot: Secondary | ICD-10-CM | POA: Insufficient documentation

## 2013-06-18 DIAGNOSIS — R059 Cough, unspecified: Secondary | ICD-10-CM

## 2013-06-18 DIAGNOSIS — M79672 Pain in left foot: Principal | ICD-10-CM

## 2013-06-18 DIAGNOSIS — R05 Cough: Secondary | ICD-10-CM

## 2013-06-18 DIAGNOSIS — D510 Vitamin B12 deficiency anemia due to intrinsic factor deficiency: Secondary | ICD-10-CM

## 2013-06-18 MED ORDER — MOMETASONE FUROATE 50 MCG/ACT NA SUSP: 2.0000 | Freq: Every day | NASAL | Status: DC | PRN

## 2013-06-18 MED ORDER — PROMETHAZINE-CODEINE 6.25-10 MG/5ML PO SYRP: ORAL_SOLUTION | ORAL | Status: DC

## 2013-06-18 MED ORDER — CETIRIZINE HCL 10 MG PO TABS: 10.0000 mg | ORAL_TABLET | Freq: Every day | ORAL | Status: DC

## 2013-06-18 NOTE — Assessment & Plan Note (Signed)
Continue with current prescription therapy as reflected on the Med list.  

## 2013-06-18 NOTE — Progress Notes (Signed)
Subjective:   C/o allergies C/o feet pain/burning w/chemo  F/u colon ca - feeling better. On Chemo po  F/u  R knee pain - may need a TKR per Dr Marlou Sa   Hypertension This is a chronic problem. The current episode started more than 1 year ago. The problem is unchanged. Pertinent negatives include no headaches, neck pain, palpitations or shortness of breath. There is no history of kidney disease.   The patient presents for a follow-up of  chronic GERD, asthma controlled with medicines  BP Readings from Last 3 Encounters:  06/18/13 120/80  06/04/13 123/64  05/14/13 120/62   Wt Readings from Last 3 Encounters:  06/18/13 223 lb (101.152 kg)  06/04/13 221 lb 14.4 oz (100.653 kg)  05/14/13 221 lb (100.245 kg)    Review of Systems  Constitutional: Negative.  Negative for fever, chills, diaphoresis, activity change, appetite change and unexpected weight change.  HENT: Negative for congestion, ear pain, facial swelling, hearing loss, mouth sores, nosebleeds, postnasal drip, rhinorrhea, sinus pressure, sneezing, sore throat, tinnitus and trouble swallowing.   Eyes: Negative for pain, discharge, redness, itching and visual disturbance.  Respiratory: Negative for cough, chest tightness, shortness of breath, wheezing and stridor.   Cardiovascular: Negative for palpitations and leg swelling.  Gastrointestinal: Negative for nausea, diarrhea, constipation, blood in stool, abdominal distention, anal bleeding and rectal pain.  Genitourinary: Negative for dysuria, urgency, frequency, hematuria, flank pain, vaginal bleeding, vaginal discharge, difficulty urinating, genital sores and pelvic pain.  Musculoskeletal: Negative for arthralgias, back pain, gait problem, joint swelling, neck pain and neck stiffness.       L arm in the biceps area as above  Skin: Negative.  Negative for rash.  Neurological: Negative for dizziness, tremors, seizures, syncope, speech difficulty, weakness and headaches.   Hematological: Negative for adenopathy. Does not bruise/bleed easily.  Psychiatric/Behavioral: Negative for suicidal ideas, behavioral problems, sleep disturbance, dysphoric mood and decreased concentration. The patient is not nervous/anxious.        Objective:   Physical Exam  Constitutional: She appears well-developed. No distress.  Obese  HENT:  Head: Normocephalic.  Right Ear: External ear normal.  Left Ear: External ear normal.  Nose: Nose normal.  Mouth/Throat: Oropharynx is clear and moist.  Eyes: Conjunctivae are normal. Pupils are equal, round, and reactive to light. Right eye exhibits no discharge. Left eye exhibits no discharge.  Neck: Normal range of motion. Neck supple. No JVD present. No tracheal deviation present. No thyromegaly present.  Cardiovascular: Normal rate, regular rhythm and normal heart sounds.   Pulmonary/Chest: No stridor. No respiratory distress. She has no wheezes.  Abdominal: Soft. Bowel sounds are normal. She exhibits no distension and no mass. There is no tenderness. There is no rebound and no guarding.  Musculoskeletal: She exhibits no edema and no tenderness.  Lymphadenopathy:    She has no cervical adenopathy.  Neurological: She displays normal reflexes. No cranial nerve deficit. She exhibits normal muscle tone. Coordination normal.  Skin: No rash noted. No erythema.  Psychiatric: She has a normal mood and affect. Her behavior is normal. Judgment and thought content normal.   Abd scars and R colostomy B feet and ankles deformed, no rash  Lab Results  Component Value Date   WBC 6.5 06/04/2013   HGB 10.9* 06/04/2013   HCT 35.3 06/04/2013   PLT 244 06/04/2013   GLUCOSE 102 06/04/2013   CHOL 144 07/14/2012   TRIG 59.0 07/14/2012   HDL 76.60 07/14/2012   LDLCALC 56 07/14/2012  ALT 15 06/04/2013   AST 17 06/04/2013   NA 142 06/04/2013   K 3.8 06/04/2013   CL 111 02/17/2013   CREATININE 1.0 06/04/2013   BUN 14.9 06/04/2013   CO2 26 06/04/2013   TSH 1.91 01/27/2013    INR 1.07 02/02/2013   HGBA1C 6.2 09/07/2009         Assessment & Plan:

## 2013-06-18 NOTE — Telephone Encounter (Signed)
Pt has a colostomy.  She has run out of paste remover and is calling to ask if we have any alternative suggestions.  She states her supplies will not be delivered until next week.  I suggested she either call a local pharmacy for suggestions or a medical supply store.  Pt agreed to try these options.

## 2013-06-18 NOTE — Assessment & Plan Note (Signed)
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

## 2013-06-18 NOTE — Assessment & Plan Note (Signed)
On chemo 

## 2013-06-18 NOTE — Assessment & Plan Note (Signed)
Labs

## 2013-06-18 NOTE — Progress Notes (Signed)
Pre visit review using our clinic review tool, if applicable. No additional management support is needed unless otherwise documented below in the visit note. 

## 2013-06-22 ENCOUNTER — Telehealth: Payer: Self-pay | Admitting: Internal Medicine

## 2013-06-22 NOTE — Telephone Encounter (Signed)
Relevant patient education mailed to patient.  

## 2013-06-25 ENCOUNTER — Telehealth: Payer: Self-pay | Admitting: *Deleted

## 2013-06-25 NOTE — Telephone Encounter (Signed)
Refill request for xeloda from [facility] given to Dr Benay Spice desk RN to review.

## 2013-06-28 ENCOUNTER — Ambulatory Visit (INDEPENDENT_AMBULATORY_CARE_PROVIDER_SITE_OTHER): Payer: Medicare Other

## 2013-06-28 ENCOUNTER — Ambulatory Visit (HOSPITAL_BASED_OUTPATIENT_CLINIC_OR_DEPARTMENT_OTHER): Payer: Medicare Other | Admitting: Nurse Practitioner

## 2013-06-28 ENCOUNTER — Other Ambulatory Visit (HOSPITAL_BASED_OUTPATIENT_CLINIC_OR_DEPARTMENT_OTHER): Payer: Medicare Other

## 2013-06-28 ENCOUNTER — Telehealth: Payer: Self-pay | Admitting: Oncology

## 2013-06-28 VITALS — BP 146/75 | HR 77 | Temp 97.7°F | Resp 20 | Ht 62.0 in | Wt 222.1 lb

## 2013-06-28 DIAGNOSIS — R7309 Other abnormal glucose: Secondary | ICD-10-CM | POA: Diagnosis not present

## 2013-06-28 DIAGNOSIS — D510 Vitamin B12 deficiency anemia due to intrinsic factor deficiency: Secondary | ICD-10-CM | POA: Diagnosis not present

## 2013-06-28 DIAGNOSIS — D509 Iron deficiency anemia, unspecified: Secondary | ICD-10-CM

## 2013-06-28 DIAGNOSIS — C185 Malignant neoplasm of splenic flexure: Secondary | ICD-10-CM | POA: Diagnosis not present

## 2013-06-28 DIAGNOSIS — J309 Allergic rhinitis, unspecified: Secondary | ICD-10-CM | POA: Diagnosis not present

## 2013-06-28 DIAGNOSIS — C184 Malignant neoplasm of transverse colon: Secondary | ICD-10-CM

## 2013-06-28 LAB — COMPREHENSIVE METABOLIC PANEL (CC13)
ALK PHOS: 137 U/L (ref 40–150)
ALT: 17 U/L (ref 0–55)
AST: 18 U/L (ref 5–34)
Albumin: 3.3 g/dL — ABNORMAL LOW (ref 3.5–5.0)
Anion Gap: 14 mEq/L — ABNORMAL HIGH (ref 3–11)
BUN: 13 mg/dL (ref 7.0–26.0)
CO2: 22 mEq/L (ref 22–29)
Calcium: 9.1 mg/dL (ref 8.4–10.4)
Chloride: 109 mEq/L (ref 98–109)
Creatinine: 0.9 mg/dL (ref 0.6–1.1)
GLUCOSE: 104 mg/dL (ref 70–140)
Potassium: 3.7 mEq/L (ref 3.5–5.1)
SODIUM: 144 meq/L (ref 136–145)
TOTAL PROTEIN: 6.9 g/dL (ref 6.4–8.3)
Total Bilirubin: 0.76 mg/dL (ref 0.20–1.20)

## 2013-06-28 LAB — CBC WITH DIFFERENTIAL/PLATELET
BASO%: 1.6 % (ref 0.0–2.0)
Basophils Absolute: 0.1 10*3/uL (ref 0.0–0.1)
EOS ABS: 0.2 10*3/uL (ref 0.0–0.5)
EOS%: 3 % (ref 0.0–7.0)
HCT: 37 % (ref 34.8–46.6)
HGB: 11.7 g/dL (ref 11.6–15.9)
LYMPH%: 28.3 % (ref 14.0–49.7)
MCH: 24.2 pg — AB (ref 25.1–34.0)
MCHC: 31.6 g/dL (ref 31.5–36.0)
MCV: 76.6 fL — AB (ref 79.5–101.0)
MONO#: 0.6 10*3/uL (ref 0.1–0.9)
MONO%: 10.3 % (ref 0.0–14.0)
NEUT%: 56.8 % (ref 38.4–76.8)
NEUTROS ABS: 3.3 10*3/uL (ref 1.5–6.5)
PLATELETS: 224 10*3/uL (ref 145–400)
RBC: 4.84 10*6/uL (ref 3.70–5.45)
RDW: 28.1 % — ABNORMAL HIGH (ref 11.2–14.5)
WBC: 5.8 10*3/uL (ref 3.9–10.3)
lymph#: 1.6 10*3/uL (ref 0.9–3.3)

## 2013-06-28 MED ORDER — CAPECITABINE 500 MG PO TABS: 1500.0000 mg | ORAL_TABLET | Freq: Two times a day (BID) | ORAL | Status: DC

## 2013-06-28 NOTE — Telephone Encounter (Signed)
gv adn printed appt sched and avs for pt for June °

## 2013-06-28 NOTE — Progress Notes (Addendum)
  [place] OFFICE PROGRESS NOTE   Diagnosis:  Colon cancer.  INTERVAL HISTORY:   [name] returns as scheduled. She completed cycle 4 adjuvant Xeloda beginning 06/12/2013. She had a single episode of nausea with vomiting soon followed by a single episode of diarrhea last week. She thinks symptoms were due to something she ate or possibly the heat. She denies any mouth sores. She has developed some calluses on the feet. She notes her hands and feet are red. No associated pain.  Objective:  Vital signs in last 24 hours:  Blood pressure 146/75, pulse 77, temperature 97.7 F (36.5 C), temperature source Oral, resp. rate 20, height 5' 2" (1.575 m), weight 222 lb 1.6 oz (100.744 kg), SpO2 100.00%.    HEENT: No thrush or ulcerations. Resp: Lungs clear. Cardio: Regular cardiac rhythm. GI: Soft and nontender. No hepatomegaly. Right abdomen colostomy. Vascular: No leg edema. Skin: Palms and soles with erythema (soles greater than palms). Skin thickening present. No skin breakdown.    Lab Results:  Lab Results  Component Value Date   WBC 5.8 06/28/2013   HGB 11.7 06/28/2013   HCT 37.0 06/28/2013   MCV 76.6* 06/28/2013   PLT 224 06/28/2013   NEUTROABS 3.3 06/28/2013    Imaging:  No results found.  Medications: I have reviewed the patient's current medications.  Assessment/Plan: 1. 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  Presentation to the emergency room 02/02/2013 with a colonic obstruction secondary to tumor at the splenic flexure.  Baseline CEA on 02/02/2013 less than 0.5.  Initiation of adjuvant Xeloda 04/10/2013. Cycle 2 adjuvant Xeloda 05/01/2013. Cycle 3 adjuvant Xeloda 05/22/2013. Cycle 4 adjuvant Xeloda  06/12/2013. 2. Iron deficiency anemia. She continues oral iron. Improved. 3. Asthma. 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   Disposition: [name] has completed 4 cycles of adjuvant Xeloda. She has developed hand-foot syndrome with erythema and skin thickening. Plan to proceed with cycle 5 adjuvant Xeloda as schedule beginning 07/03/2013 at a reduced dose of 1500 mg twice daily for 14 days. She will return for a followup visit in 3 weeks. She understands to discontinue Xeloda and contact the office with progressive hand-foot symptoms.  Patient seen with [name].    Owens Shark ANP/GNP-BC   06/28/2013  12:14 PM  This was a shared with Ned Card. [name] has developed hand-foot syndrome. The Xeloda will be dose reduced beginning with the next cycle 07/03/2013.  Julieanne Manson, M.D.

## 2013-06-29 ENCOUNTER — Encounter: Payer: Self-pay | Admitting: Internal Medicine

## 2013-06-29 NOTE — Telephone Encounter (Signed)
RECEIVED A FAX FROM [facility] CONCERNING A CONFIRMATION OF FACSIMILE RECEIPT FOR PT. REFERRAL. 

## 2013-06-30 ENCOUNTER — Telehealth: Payer: Self-pay | Admitting: *Deleted

## 2013-06-30 NOTE — Telephone Encounter (Signed)
Received confirmation of capecitabine shipment on 06/29/13 for delivery on next business day from [facility].

## 2013-07-02 ENCOUNTER — Telehealth: Payer: Self-pay | Admitting: *Deleted

## 2013-07-02 NOTE — Telephone Encounter (Signed)
PCP has referred her to [facility] for her red/callosed feet from chemotherapy. She reports they are no more red than on 06/28/13. No sores or blisters and can ambulate without pain. Is using lotion several times day. Asking if she really needs to go to podiatrist. This RN suggested she cancel the appointment for now. Her feet status is from chemo and podiatrist is unlikely to add any advice that would be of significant benefit. She is not diabetic. She understands to stop her Xeloda if feet get worse and confirms dose reduction with the cycle starting on Saturday.

## 2013-07-09 ENCOUNTER — Ambulatory Visit: Payer: Medicare Other

## 2013-07-09 ENCOUNTER — Ambulatory Visit (INDEPENDENT_AMBULATORY_CARE_PROVIDER_SITE_OTHER): Payer: Medicare Other | Admitting: Surgery

## 2013-07-09 ENCOUNTER — Ambulatory Visit (INDEPENDENT_AMBULATORY_CARE_PROVIDER_SITE_OTHER): Payer: Medicare Other

## 2013-07-09 ENCOUNTER — Encounter (INDEPENDENT_AMBULATORY_CARE_PROVIDER_SITE_OTHER): Payer: Self-pay | Admitting: Surgery

## 2013-07-09 VITALS — BP 130/80 | HR 78 | Temp 97.6°F | Ht 62.0 in | Wt 226.0 lb

## 2013-07-09 DIAGNOSIS — J309 Allergic rhinitis, unspecified: Secondary | ICD-10-CM | POA: Diagnosis not present

## 2013-07-09 DIAGNOSIS — C184 Malignant neoplasm of transverse colon: Secondary | ICD-10-CM

## 2013-07-09 NOTE — Progress Notes (Signed)
Status post left hemicolectomy and a Hartman's pouch with a right-sided colostomy on 02/04/13 for an obstructing cancer the splenic flexure. The patient is taking Xeloda under the care of Dr. Julieanne Manson. She is in her fourth cycle out of six cycles. Overall she is feeling much better. She is regaining energy. She still uses a walker but is much more independent. Her ostomy is functioning well. No sign of parastomal hernia. She also has fairly firm stool from her right colostomy. Her midline incision is completely healed.  Overall she seems to be improving. We will see her back in 3 months to see how she is doing. We will wait until she has completed her adjuvant therapy before considering colostomy reversal.  Imogene Burn. Georgette Dover, MD, [facility]  General/ Trauma Surgery  07/09/2013 10:24 AM

## 2013-07-16 ENCOUNTER — Other Ambulatory Visit: Payer: Self-pay | Admitting: *Deleted

## 2013-07-16 NOTE — Telephone Encounter (Addendum)
THIS REFILL REQUEST FOR CAPECITABINE WAS PLACED ON DR.SHERRILL'S NURSE'S DESK.

## 2013-07-19 ENCOUNTER — Other Ambulatory Visit: Payer: Self-pay | Admitting: *Deleted

## 2013-07-19 DIAGNOSIS — C184 Malignant neoplasm of transverse colon: Secondary | ICD-10-CM

## 2013-07-19 MED ORDER — CAPECITABINE 500 MG PO TABS: 1500.0000 mg | ORAL_TABLET | Freq: Two times a day (BID) | ORAL | Status: DC

## 2013-07-20 ENCOUNTER — Telehealth: Payer: Self-pay | Admitting: Oncology

## 2013-07-20 ENCOUNTER — Other Ambulatory Visit (HOSPITAL_BASED_OUTPATIENT_CLINIC_OR_DEPARTMENT_OTHER): Payer: Medicare Other

## 2013-07-20 ENCOUNTER — Ambulatory Visit (HOSPITAL_BASED_OUTPATIENT_CLINIC_OR_DEPARTMENT_OTHER): Payer: Medicare Other | Admitting: Nurse Practitioner

## 2013-07-20 ENCOUNTER — Ambulatory Visit (INDEPENDENT_AMBULATORY_CARE_PROVIDER_SITE_OTHER): Payer: Medicare Other

## 2013-07-20 VITALS — BP 140/76 | HR 64 | Temp 97.7°F | Resp 20 | Ht 62.0 in | Wt 228.3 lb

## 2013-07-20 DIAGNOSIS — D509 Iron deficiency anemia, unspecified: Secondary | ICD-10-CM

## 2013-07-20 DIAGNOSIS — C185 Malignant neoplasm of splenic flexure: Secondary | ICD-10-CM | POA: Diagnosis not present

## 2013-07-20 DIAGNOSIS — C184 Malignant neoplasm of transverse colon: Secondary | ICD-10-CM

## 2013-07-20 DIAGNOSIS — J309 Allergic rhinitis, unspecified: Secondary | ICD-10-CM

## 2013-07-20 LAB — CBC WITH DIFFERENTIAL/PLATELET
BASO%: 1.1 % (ref 0.0–2.0)
Basophils Absolute: 0.1 10*3/uL (ref 0.0–0.1)
EOS%: 3 % (ref 0.0–7.0)
Eosinophils Absolute: 0.2 10*3/uL (ref 0.0–0.5)
HCT: 38.6 % (ref 34.8–46.6)
HGB: 12.1 g/dL (ref 11.6–15.9)
LYMPH#: 1.9 10*3/uL (ref 0.9–3.3)
LYMPH%: 25.8 % (ref 14.0–49.7)
MCH: 24.9 pg — AB (ref 25.1–34.0)
MCHC: 31.2 g/dL — AB (ref 31.5–36.0)
MCV: 79.8 fL (ref 79.5–101.0)
MONO#: 0.7 10*3/uL (ref 0.1–0.9)
MONO%: 10.1 % (ref 0.0–14.0)
NEUT#: 4.4 10*3/uL (ref 1.5–6.5)
NEUT%: 60 % (ref 38.4–76.8)
Platelets: 224 10*3/uL (ref 145–400)
RBC: 4.84 10*6/uL (ref 3.70–5.45)
RDW: 24.6 % — AB (ref 11.2–14.5)
WBC: 7.3 10*3/uL (ref 3.9–10.3)

## 2013-07-20 LAB — COMPREHENSIVE METABOLIC PANEL (CC13)
ALBUMIN: 3.3 g/dL — AB (ref 3.5–5.0)
ALT: 20 U/L (ref 0–55)
AST: 18 U/L (ref 5–34)
Alkaline Phosphatase: 165 U/L — ABNORMAL HIGH (ref 40–150)
Anion Gap: 8 mEq/L (ref 3–11)
BUN: 15.7 mg/dL (ref 7.0–26.0)
CALCIUM: 9.5 mg/dL (ref 8.4–10.4)
CHLORIDE: 110 meq/L — AB (ref 98–109)
CO2: 26 mEq/L (ref 22–29)
Creatinine: 1.2 mg/dL — ABNORMAL HIGH (ref 0.6–1.1)
Glucose: 94 mg/dl (ref 70–140)
POTASSIUM: 3.9 meq/L (ref 3.5–5.1)
SODIUM: 144 meq/L (ref 136–145)
Total Bilirubin: 0.55 mg/dL (ref 0.20–1.20)
Total Protein: 7.2 g/dL (ref 6.4–8.3)

## 2013-07-20 MED ORDER — CAPECITABINE 500 MG PO TABS: 1500.0000 mg | ORAL_TABLET | Freq: Two times a day (BID) | ORAL | Status: DC

## 2013-07-20 NOTE — Telephone Encounter (Signed)
RECEIVED A FAX FROM [facility] CONCERNING A CONFIRMATION OF FACSIMILE RECEIPT FOR PT. REFERRAL. 

## 2013-07-20 NOTE — Progress Notes (Signed)
Faxed Xeloda prescription to [facility]. Start Date 07/24/13

## 2013-07-20 NOTE — Telephone Encounter (Signed)
Gave pt appt for lab,MD and ML for July and August

## 2013-07-20 NOTE — Progress Notes (Signed)
  [place] OFFICE PROGRESS NOTE   Diagnosis:  Colon cancer.  INTERVAL HISTORY:   [name] returns as scheduled. She completed cycle 5 adjuvant Xeloda beginning 07/03/2013. She denies nausea/vomiting. No mouth sores. No diarrhea. She denies hand or foot pain. She notes some redness over the hands and feet. No skin breakdown. She continues to have a good appetite and reports that overall she feels well. She denies pain.  Objective:  Vital signs in last 24 hours:  Blood pressure 140/76, pulse 64, temperature 97.7 F (36.5 C), temperature source Oral, resp. rate 20, height $RemoveBe'5\' 2"'jDeUglJpb$  (1.575 m), weight 228 lb 4.8 oz (103.556 kg), SpO2 100.00%.    HEENT: No thrush or ulcerations. Resp: Lungs clear. Cardio: Regular cardiac rhythm. GI: Abdomen soft and nontender. No hepatomegaly. Right lower quadrant colostomy. Vascular: Trace lower leg edema bilaterally. Calves nontender. Skin: Erythema and skin thickening over the palms and soles. No skin breakdown.    Lab Results:  Lab Results  Component Value Date   WBC 7.3 07/20/2013   HGB 12.1 07/20/2013   HCT 38.6 07/20/2013   MCV 79.8 07/20/2013   PLT 224 07/20/2013   NEUTROABS 4.4 07/20/2013    Imaging:  No results found.  Medications: I have reviewed the patient's current medications.  Assessment/Plan: 1. 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  Presentation to the emergency room 02/02/2013 with a colonic obstruction secondary to tumor at the splenic flexure.  Baseline CEA on 02/02/2013 less than 0.5.  Initiation of adjuvant Xeloda 04/10/2013.  Cycle 2 adjuvant Xeloda 05/01/2013.  Cycle 3 adjuvant Xeloda 05/22/2013.  Cycle 4 adjuvant Xeloda 06/12/2013. Cycle 5  adjuvant Xeloda 07/03/2013 (Xeloda dose reduced due to hand foot syndrome). 2. Iron deficiency anemia. She continues oral iron. Improved. 3. Asthma. 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 Stable.   Disposition: [name] appears well. She has completed 5 cycles of adjuvant Xeloda. Plan to proceed with cycle 6 as scheduled on 07/24/2013. She will return for a followup visit in 3 weeks. She understands to contact the office with signs/symptoms suggestive of progressive hand-foot syndrome.    Ned Card ANP/GNP-BC   07/20/2013  1:11 PM

## 2013-07-22 NOTE — Telephone Encounter (Signed)
RECEIVED A FAX FROM [facility] CONCERNING A CONFIRMATION OF PRESCRIPTION SHIPMENT FOR CAPECITABINE ON 07/21/13.

## 2013-07-28 ENCOUNTER — Ambulatory Visit (INDEPENDENT_AMBULATORY_CARE_PROVIDER_SITE_OTHER): Payer: Medicare Other

## 2013-07-28 DIAGNOSIS — J309 Allergic rhinitis, unspecified: Secondary | ICD-10-CM

## 2013-08-04 ENCOUNTER — Ambulatory Visit (INDEPENDENT_AMBULATORY_CARE_PROVIDER_SITE_OTHER): Payer: Medicare Other

## 2013-08-04 DIAGNOSIS — J309 Allergic rhinitis, unspecified: Secondary | ICD-10-CM

## 2013-08-06 ENCOUNTER — Ambulatory Visit (INDEPENDENT_AMBULATORY_CARE_PROVIDER_SITE_OTHER): Payer: Medicare Other

## 2013-08-06 DIAGNOSIS — J309 Allergic rhinitis, unspecified: Secondary | ICD-10-CM

## 2013-08-10 ENCOUNTER — Other Ambulatory Visit (HOSPITAL_BASED_OUTPATIENT_CLINIC_OR_DEPARTMENT_OTHER): Payer: Medicare Other

## 2013-08-10 ENCOUNTER — Ambulatory Visit (HOSPITAL_BASED_OUTPATIENT_CLINIC_OR_DEPARTMENT_OTHER): Payer: Medicare Other | Admitting: Nurse Practitioner

## 2013-08-10 ENCOUNTER — Other Ambulatory Visit: Payer: Self-pay | Admitting: *Deleted

## 2013-08-10 ENCOUNTER — Ambulatory Visit (INDEPENDENT_AMBULATORY_CARE_PROVIDER_SITE_OTHER): Payer: Medicare Other

## 2013-08-10 ENCOUNTER — Ambulatory Visit: Payer: Medicare Other | Admitting: Internal Medicine

## 2013-08-10 VITALS — BP 133/74 | HR 63 | Temp 98.0°F | Resp 18 | Ht 62.0 in | Wt 229.8 lb

## 2013-08-10 DIAGNOSIS — C185 Malignant neoplasm of splenic flexure: Secondary | ICD-10-CM

## 2013-08-10 DIAGNOSIS — C184 Malignant neoplasm of transverse colon: Secondary | ICD-10-CM

## 2013-08-10 DIAGNOSIS — L270 Generalized skin eruption due to drugs and medicaments taken internally: Secondary | ICD-10-CM | POA: Diagnosis not present

## 2013-08-10 DIAGNOSIS — J309 Allergic rhinitis, unspecified: Secondary | ICD-10-CM | POA: Diagnosis not present

## 2013-08-10 DIAGNOSIS — D509 Iron deficiency anemia, unspecified: Secondary | ICD-10-CM

## 2013-08-10 LAB — COMPREHENSIVE METABOLIC PANEL (CC13)
ALBUMIN: 3.3 g/dL — AB (ref 3.5–5.0)
ALK PHOS: 138 U/L (ref 40–150)
ALT: 17 U/L (ref 0–55)
AST: 18 U/L (ref 5–34)
Anion Gap: 8 mEq/L (ref 3–11)
BILIRUBIN TOTAL: 0.5 mg/dL (ref 0.20–1.20)
BUN: 14.2 mg/dL (ref 7.0–26.0)
CO2: 25 mEq/L (ref 22–29)
Calcium: 9.4 mg/dL (ref 8.4–10.4)
Chloride: 110 mEq/L — ABNORMAL HIGH (ref 98–109)
Creatinine: 0.9 mg/dL (ref 0.6–1.1)
GLUCOSE: 96 mg/dL (ref 70–140)
Potassium: 3.9 mEq/L (ref 3.5–5.1)
Sodium: 143 mEq/L (ref 136–145)
Total Protein: 7 g/dL (ref 6.4–8.3)

## 2013-08-10 LAB — CBC WITH DIFFERENTIAL/PLATELET
BASO%: 1.3 % (ref 0.0–2.0)
BASOS ABS: 0.1 10*3/uL (ref 0.0–0.1)
EOS ABS: 0.1 10*3/uL (ref 0.0–0.5)
EOS%: 2.2 % (ref 0.0–7.0)
HCT: 37.6 % (ref 34.8–46.6)
HGB: 11.8 g/dL (ref 11.6–15.9)
LYMPH%: 27.7 % (ref 14.0–49.7)
MCH: 25.5 pg (ref 25.1–34.0)
MCHC: 31.4 g/dL — ABNORMAL LOW (ref 31.5–36.0)
MCV: 81.1 fL (ref 79.5–101.0)
MONO#: 0.7 10*3/uL (ref 0.1–0.9)
MONO%: 11 % (ref 0.0–14.0)
NEUT%: 57.8 % (ref 38.4–76.8)
NEUTROS ABS: 3.5 10*3/uL (ref 1.5–6.5)
Platelets: 204 10*3/uL (ref 145–400)
RBC: 4.64 10*6/uL (ref 3.70–5.45)
RDW: 22.4 % — AB (ref 11.2–14.5)
WBC: 6 10*3/uL (ref 3.9–10.3)
lymph#: 1.6 10*3/uL (ref 0.9–3.3)

## 2013-08-10 MED ORDER — CAPECITABINE 500 MG PO TABS: 1500.0000 mg | ORAL_TABLET | Freq: Two times a day (BID) | ORAL | Status: DC

## 2013-08-10 NOTE — Telephone Encounter (Signed)
THIS REFILL REQUEST FOR CAPECITABINE WAS GIVEN TO DR.SHERRILL.

## 2013-08-10 NOTE — Progress Notes (Addendum)
  [place] OFFICE PROGRESS NOTE   Diagnosis:  Colon cancer.  INTERVAL HISTORY:   [name] returns as scheduled. She completed cycle 6 of adjuvant Xeloda beginning 07/24/2013. She denies nausea/vomiting. No mouth sores. She has intermittent loose stools. She notes the feet are more red. No hand or foot pain or skin breakdown.  Objective:  Vital signs in last 24 hours:  Blood pressure 133/74, pulse 63, temperature 98 F (36.7 C), temperature source Oral, resp. rate 18, height $RemoveBe'5\' 2"'oQdIkAWKF$  (1.575 m), weight 229 lb 12.8 oz (104.237 kg), SpO2 100.00%.    HEENT: No thrush or ulceration. Resp: Lungs clear. Cardio: Regular cardiac rhythm. GI: Abdomen soft and nontender. Right lower quadrant colostomy. Vascular: No leg edema.  Skin: Palms with mild erythema. No skin breakdown. Soles with erythema and skin thickening as well as callus formation at the heels.   Lab Results:  Lab Results  Component Value Date   WBC 6.0 08/10/2013   HGB 11.8 08/10/2013   HCT 37.6 08/10/2013   MCV 81.1 08/10/2013   PLT 204 08/10/2013   NEUTROABS 3.5 08/10/2013    Imaging:  No results found.  Medications: I have reviewed the patient's current medications.  Assessment/Plan: 1. 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  Presentation to the emergency room 02/02/2013 with a colonic obstruction secondary to tumor at the splenic flexure.  Baseline CEA on 02/02/2013 less than 0.5.  Initiation of adjuvant Xeloda 04/10/2013.  Cycle 2 adjuvant Xeloda 05/01/2013.  Cycle 3 adjuvant Xeloda 05/22/2013.  Cycle 4 adjuvant Xeloda 06/12/2013.  Cycle 5 adjuvant Xeloda 07/03/2013 (Xeloda dose reduced due to hand foot syndrome). Cycle 6 adjuvant Xeloda  07/24/2013. 2. Iron deficiency anemia. She continues oral iron. Improved. 3. Asthma. 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   Disposition: [name] appears stable. She has completed 6 cycles of adjuvant Xeloda. She will proceed with cycle 7 as scheduled on 08/14/2013. We will see her in followup in 3 weeks. She will contact the office in the interim with worsening hand/foot symptoms.  Patient seen with [name].    Ned Card ANP/GNP-BC   08/10/2013  12:20 PM   This was a shared visit with Ned Card. She appears to have mild to moderate hand-and-foot syndrome.  She will proceed with cycle 7 adjuvant Xeloda 08/14/2013. She knows to contact us for hand or foot pain.  Julieanne Manson, M.D.

## 2013-08-11 NOTE — Telephone Encounter (Signed)
Fax rcvd from [facility] dtd 08/11/13 confirming referral receipt for Xeloda.  Sent to scan.

## 2013-08-12 NOTE — Telephone Encounter (Signed)
RECEIVED A FAX FROM [facility] CONCERNING A CONFIRMATION OF PRESCRIPTION SHIPMENT FOR CAPECITABINE ON 08/11/13.

## 2013-08-16 ENCOUNTER — Telehealth: Payer: Self-pay | Admitting: *Deleted

## 2013-08-16 ENCOUNTER — Telehealth: Payer: Self-pay | Admitting: Oncology

## 2013-08-16 NOTE — Telephone Encounter (Signed)
appt w/BC for 7/21 per 7/20 pof already on schedule. appt added by Amy M.

## 2013-08-16 NOTE — Telephone Encounter (Signed)
Pt called requesting appt "My right big toe is bruised on top"  Pt reports no skin breakdown, no other bruises/bleeding anywhere; she did call this Saturday "call-a-nurse" and Dr. Marin Olp advised pt to hold Xeloda and get appt to see MD this week.  Per [name]; notified pt to come in tomorrow 7/21 @ 11 to see Selena Lesser, NP re: foot.  Pt verbalized understanding

## 2013-08-17 ENCOUNTER — Ambulatory Visit (HOSPITAL_BASED_OUTPATIENT_CLINIC_OR_DEPARTMENT_OTHER): Payer: Medicare Other | Admitting: Nurse Practitioner

## 2013-08-17 ENCOUNTER — Ambulatory Visit (INDEPENDENT_AMBULATORY_CARE_PROVIDER_SITE_OTHER): Payer: Medicare Other

## 2013-08-17 ENCOUNTER — Encounter: Payer: Self-pay | Admitting: Nurse Practitioner

## 2013-08-17 VITALS — BP 153/84 | HR 64 | Temp 98.2°F | Resp 18 | Ht 62.0 in | Wt 229.6 lb

## 2013-08-17 DIAGNOSIS — L271 Localized skin eruption due to drugs and medicaments taken internally: Secondary | ICD-10-CM

## 2013-08-17 DIAGNOSIS — C185 Malignant neoplasm of splenic flexure: Secondary | ICD-10-CM

## 2013-08-17 DIAGNOSIS — L270 Generalized skin eruption due to drugs and medicaments taken internally: Secondary | ICD-10-CM

## 2013-08-17 DIAGNOSIS — J309 Allergic rhinitis, unspecified: Secondary | ICD-10-CM | POA: Diagnosis not present

## 2013-08-17 DIAGNOSIS — C184 Malignant neoplasm of transverse colon: Secondary | ICD-10-CM

## 2013-08-17 NOTE — Assessment & Plan Note (Addendum)
Patient is status post 6 cycles of adjuvant Xeloda.  She just started her next cycle of the lower this past Saturday, 08/14/2013.  Cycle 5 of her Xeloda had been reduced to 2 some worsening issues with hand/foot syndrome.  It does appear that her hand/foot syndrome issues are slowly resolving; with the exception of some darkening of the skin around her right great toe.  Patient was advised to hold her Xeloda over the weekend I told further evaluation and followup.  Advised patient that she should restart her as a low to his previously directed.  She has an appointment for further followup and labs on 08/31/2013.
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
## 2013-08-17 NOTE — Assessment & Plan Note (Signed)
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  It does appear that patient's hands are slowly healing.  Patient does have some very mild discoloration to the soles of her feet; as well as some right great toe discoloration.  Patient's right great toe is nontender, nonerythematous, is not warm.  Patient is observed with full range of motion with her right great toe as well.  Patient was encouraged to keep her hand and feet very remoisturize; and protected from extremes in temperature.  Patient was advised to call if she has any worsening symptoms whatsoever.

## 2013-08-24 ENCOUNTER — Ambulatory Visit (INDEPENDENT_AMBULATORY_CARE_PROVIDER_SITE_OTHER): Payer: Medicare Other

## 2013-08-24 ENCOUNTER — Encounter: Payer: Self-pay | Admitting: Internal Medicine

## 2013-08-24 ENCOUNTER — Ambulatory Visit (INDEPENDENT_AMBULATORY_CARE_PROVIDER_SITE_OTHER): Payer: Medicare Other | Admitting: Internal Medicine

## 2013-08-24 VITALS — BP 124/66 | HR 54 | Ht 62.0 in | Wt 231.0 lb

## 2013-08-24 DIAGNOSIS — G4733 Obstructive sleep apnea (adult) (pediatric): Secondary | ICD-10-CM

## 2013-08-24 DIAGNOSIS — J309 Allergic rhinitis, unspecified: Secondary | ICD-10-CM | POA: Diagnosis not present

## 2013-08-24 DIAGNOSIS — J301 Allergic rhinitis due to pollen: Secondary | ICD-10-CM

## 2013-08-24 MED ORDER — MONTELUKAST SODIUM 10 MG PO TABS: 10.0000 mg | ORAL_TABLET | Freq: Every day | ORAL | Status: DC

## 2013-08-24 NOTE — Patient Instructions (Signed)
We can continue CPAP. You can turn the humidifier up for dryness. Ask your home care company for help with this if needed.  We can continue allergy vaccine 1:10 GH  Try otc Biotene for dry mouth. You can ask for it at your drug store.

## 2013-08-24 NOTE — Progress Notes (Signed)
Subjective:    Patient ID: [name], female    DOB: 1950-11-20, 63 y.o.   MRN: [id number]  HPI 08/07/10- 60 yoF never smoker followed for asthma, allergic rhinitis,, OSA, obesity/hypoventilation, complicated by GERD, arthritis, HBP Last here January 31, 2010 CPAP still used every night, works well at [employer]. Allergy vaccine at 1;10, still seems to have helped.  Recent hot weather has been hard on her- coughing more, wheezing more, interfering with sleep. Sputum trace yellow. Had temp 101, no sore throat. Some nausea and vomiting  No chest pain.    08/23/11-  61 yoF never smoker followed for asthma, allergic rhinitis,, OSA, obesity/hypoventilation, complicated by GERD, arthritis, HBP Patient states a little better since last visit. c/o dry cough x 1 week.  Denies sob, wheezing, chest pain, and chest tightness.  She likes humid weather that we have had this year better than very dry weather. She reports doing well with her allergy vaccine at 1:10 GH. Using rescue inhaler or nebulizer about once daily. She depends on her nebulizer/ albuterol neb solution especially if she's a little tighter. We discussed appropriate use. Walking regularly 2-3 times per week. She reports good compliance and control with her CPAP 9/ [facility].  08/24/12- 61 yoF never smoker followed for asthma, allergic rhinitis,, OSA, obesity/hypoventilation, complicated by GERD, arthritis, HBP FOLLOWS FOR: pt reports breathing about the same since last visit-- having a little dry ocugh d/t hot weather-- denies any other concerns at this time  Allergy vaccine 1:10 GH OSA-CPAP 9/ [facility]. She says she uses it all night every night. Minor dry cough. She is aware of mild reflux but no major events.  08/24/13- 63 yoF never smoker followed for asthma, allergic rhinitis,, OSA, obesity/hypoventilation, complicated by GERD, arthritis, HBP, Colon Ca/ L hemicolectomy FOLLOWS FOR: c/o mouth dryness in mornings after using  CPAP 9/ [facility], nonprod cough after being in the heat.  Pt wears cpap 6-8 hours nightly, no problems with mask/supplies.  Tolerating allergy injections well.  Allergy vaccine 1:10 GH Had colon cancer surgery in January and pending reversal of colostomy in September with no respiratory problems so far. CXR 02/09/13 IMPRESSION:  1. Enteric tube tip and side port projects below the left  hemidiaphragm. Otherwise, stable position of remaining support  apparatus.  2. Improved aeration of the lungs with persistent perihilar and  medial basilar opacities, likely atelectasis.  3. Pulmonary venous congestion without frank evidence of edema.  Electronically Signed  By: Sandi Mariscal M.D.  On: 02/09/2013 07:46  ROS-see HPI Constitutional:   No-   weight loss, night sweats, fevers, chills, fatigue, lassitude. HEENT:   No-  headaches, difficulty swallowing, tooth/dental problems, sore throat,       No-  sneezing, itching, ear ache, nasal congestion, post nasal drip,  CV:  No-   chest pain, orthopnea, PND, swelling in lower extremities, anasarca, dizziness, palpitations Resp: No- acute  shortness of breath with exertion or at rest.              No-   productive cough,  +non-productive cough,  No- coughing up of blood.              No-   change in color of mucus.  No-sustained wheezing.   Skin: No-   rash or lesions. GI:  + heartburn, indigestion, no-abdominal pain, nausea, vomiting,  GU: No  complaint MS:  No-   joint pain or swelling.   Neuro-     nothing unusual Psych:  No- change in mood or affect. No depression or anxiety.  No memory loss.  OBJ- Physical Exam General- Alert, Oriented, Affect-appropriate, Distress- none acute, obese Skin- rash-none, lesions- none, excoriation- none Lymphadenopathy- none Head- atraumatic            Eyes- Gross vision intact, PERRLA, conjunctivae and secretions clear            Ears- Hearing, canals-normal            Nose- Clear, no-Septal dev, mucus, polyps,  erosion, perforation             Throat- Mallampati II , mucosa clear,not dry , drainage- none, tonsils- atrophic, + hoarse Neck- flexible , trachea midline, no stridor , thyroid nl, carotid no bruit Chest - symmetrical excursion , unlabored           Heart/CV- RRR , no murmur , no gallop  , no rub, nl s1 s2                           - JVD- none , edema- none, stasis changes- none, varices- none           Lung- clear to P&A, wheeze- none, + mild dry cough , dullness-none, rub- none           Chest wall-  Abd-  Br/ Gen/ Rectal- Not done, not indicated Extrem- cyanosis- none, clubbing, none, atrophy- none, strength- nl Neuro- grossly intact to observation

## 2013-08-27 ENCOUNTER — Other Ambulatory Visit: Payer: Self-pay | Admitting: *Deleted

## 2013-08-27 NOTE — Telephone Encounter (Signed)
THIS REFILL REQUEST FOR CAPECITABINE WAS GIVEN TO DR.SHERRILL'S NURSE, SUSAN COWARD,RN. 

## 2013-08-31 ENCOUNTER — Telehealth: Payer: Self-pay | Admitting: Oncology

## 2013-08-31 ENCOUNTER — Other Ambulatory Visit: Payer: Self-pay | Admitting: *Deleted

## 2013-08-31 ENCOUNTER — Ambulatory Visit (INDEPENDENT_AMBULATORY_CARE_PROVIDER_SITE_OTHER): Payer: Medicare Other

## 2013-08-31 ENCOUNTER — Ambulatory Visit: Payer: Medicare Other

## 2013-08-31 ENCOUNTER — Other Ambulatory Visit (HOSPITAL_BASED_OUTPATIENT_CLINIC_OR_DEPARTMENT_OTHER): Payer: Medicare Other

## 2013-08-31 ENCOUNTER — Ambulatory Visit (HOSPITAL_BASED_OUTPATIENT_CLINIC_OR_DEPARTMENT_OTHER): Payer: Medicare Other | Admitting: Oncology

## 2013-08-31 VITALS — BP 123/74 | HR 61 | Temp 98.1°F | Resp 18 | Ht 62.0 in | Wt 229.6 lb

## 2013-08-31 DIAGNOSIS — C184 Malignant neoplasm of transverse colon: Secondary | ICD-10-CM

## 2013-08-31 DIAGNOSIS — J309 Allergic rhinitis, unspecified: Secondary | ICD-10-CM

## 2013-08-31 DIAGNOSIS — D509 Iron deficiency anemia, unspecified: Secondary | ICD-10-CM

## 2013-08-31 DIAGNOSIS — C185 Malignant neoplasm of splenic flexure: Secondary | ICD-10-CM

## 2013-08-31 LAB — CBC WITH DIFFERENTIAL/PLATELET
BASO%: 1.3 % (ref 0.0–2.0)
Basophils Absolute: 0.1 10*3/uL (ref 0.0–0.1)
EOS ABS: 0.1 10*3/uL (ref 0.0–0.5)
EOS%: 2.1 % (ref 0.0–7.0)
HEMATOCRIT: 38.2 % (ref 34.8–46.6)
HGB: 12 g/dL (ref 11.6–15.9)
LYMPH#: 1.7 10*3/uL (ref 0.9–3.3)
LYMPH%: 30.6 % (ref 14.0–49.7)
MCH: 26 pg (ref 25.1–34.0)
MCHC: 31.5 g/dL (ref 31.5–36.0)
MCV: 82.6 fL (ref 79.5–101.0)
MONO#: 0.5 10*3/uL (ref 0.1–0.9)
MONO%: 9.5 % (ref 0.0–14.0)
NEUT%: 56.5 % (ref 38.4–76.8)
NEUTROS ABS: 3.2 10*3/uL (ref 1.5–6.5)
Platelets: 218 10*3/uL (ref 145–400)
RBC: 4.63 10*6/uL (ref 3.70–5.45)
RDW: 20.2 % — ABNORMAL HIGH (ref 11.2–14.5)
WBC: 5.7 10*3/uL (ref 3.9–10.3)

## 2013-08-31 LAB — COMPREHENSIVE METABOLIC PANEL (CC13)
ALT: 13 U/L (ref 0–55)
ANION GAP: 9 meq/L (ref 3–11)
AST: 15 U/L (ref 5–34)
Albumin: 3.3 g/dL — ABNORMAL LOW (ref 3.5–5.0)
Alkaline Phosphatase: 146 U/L (ref 40–150)
BILIRUBIN TOTAL: 0.61 mg/dL (ref 0.20–1.20)
BUN: 14.8 mg/dL (ref 7.0–26.0)
CALCIUM: 9.4 mg/dL (ref 8.4–10.4)
CHLORIDE: 110 meq/L — AB (ref 98–109)
CO2: 25 meq/L (ref 22–29)
Creatinine: 0.9 mg/dL (ref 0.6–1.1)
Glucose: 95 mg/dl (ref 70–140)
Potassium: 4.1 mEq/L (ref 3.5–5.1)
SODIUM: 143 meq/L (ref 136–145)
TOTAL PROTEIN: 7.2 g/dL (ref 6.4–8.3)

## 2013-08-31 MED ORDER — CAPECITABINE 500 MG PO TABS: 1500.0000 mg | ORAL_TABLET | Freq: Two times a day (BID) | ORAL | Status: DC

## 2013-08-31 NOTE — Telephone Encounter (Signed)
gv and printed appt sched and avs for pt for SEpt...s.w. [facility] and pt still had balance. I gv pt billing # and she will contact office once paid they will sched appt

## 2013-08-31 NOTE — Progress Notes (Signed)
  [facility] OFFICE PROGRESS NOTE   Diagnosis: Colon cancer  INTERVAL HISTORY:   [name] returns as scheduled. She began cycle 7 of adjuvant Xeloda on 08/14/2013. She was seen on 08/17/2013 with increased discoloration at the right great toe. Xeloda have been placed on hold when she called with increased discoloration. Xeloda was resumed on 08/17/2013 and completed 08/30/2013. She denies mouth sores, nausea, diarrhea, and hand/foot pain. The hands and feet remain discolored.  Objective:  Vital signs in last 24 hours:  Blood pressure 123/74, pulse 61, temperature 98.1 F (36.7 C), temperature source Oral, resp. rate 18, height $RemoveBe'5\' 2"'bAxcJnkcI$  (1.575 m), weight 229 lb 9.6 oz (104.146 kg), SpO2 98.00%.    HEENT: No thrush or ulcers Resp: Lungs clear bilaterally Cardio: Regular rate and rhythm GI: No hepatomegaly, nontender, right lower quadrant colostomy with formed stool Vascular: No leg edema  Skin: Hyperpigmentation of the hands and feet. Skin thickening and dry desquamation at the soles. No ulcers or erythema.     Medications: I have reviewed the patient's current medications.  Laboratory: Hemoglobin 12, MCV 82.6, ANC 3.2, platelets 218 pounds  Assessment/Plan: 1. 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  Presentation to the emergency room 02/02/2013 with a colonic obstruction secondary to tumor at the splenic flexure.  Baseline CEA on 02/02/2013 less than 0.5.  Initiation of adjuvant Xeloda 04/10/2013.  Cycle 2 adjuvant Xeloda 05/01/2013.  Cycle 3 adjuvant Xeloda 05/22/2013.  Cycle 4 adjuvant Xeloda 06/12/2013.  Cycle 5 adjuvant Xeloda 07/03/2013 (Xeloda dose reduced due to hand foot syndrome).  Cycle 6 adjuvant  Xeloda 07/24/2013. Cycle 7 adjuvant Xeloda 08/14/2013 Cycle 8 adjuvant Xeloda 11 2015 2. Iron deficiency anemia. She continues oral iron. Improved. 3. Asthma. 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 Partially improved.   Disposition:  [name] appears stable. The hand/foot syndrome has improved. She will begin the final cycle of adjuvant Xeloda on 09/07/2013. We will refer her to Dr.Tsuei for reversal of the colostomy. She will discontinue Xeloda and contact us for increased hand or foot pain. [name] will return for an office visit in 3-4 weeks.  Betsy Coder, MD  08/31/2013  12:17 PM

## 2013-09-06 NOTE — Telephone Encounter (Signed)
RECEIVED A FAX FROM [facility] CONCERNING A CONFIRMATION OF PRESCRIPTION SHIPMENT FOR CAPECITABINE ON 09/03/13.

## 2013-09-07 ENCOUNTER — Ambulatory Visit: Payer: Medicare Other

## 2013-09-08 ENCOUNTER — Ambulatory Visit (INDEPENDENT_AMBULATORY_CARE_PROVIDER_SITE_OTHER): Payer: Medicare Other

## 2013-09-08 DIAGNOSIS — J309 Allergic rhinitis, unspecified: Secondary | ICD-10-CM

## 2013-09-15 ENCOUNTER — Ambulatory Visit: Payer: Medicare Other

## 2013-09-20 ENCOUNTER — Ambulatory Visit (INDEPENDENT_AMBULATORY_CARE_PROVIDER_SITE_OTHER): Payer: Medicare Other | Admitting: Surgery

## 2013-09-20 ENCOUNTER — Encounter: Payer: Self-pay | Admitting: Internal Medicine

## 2013-09-20 ENCOUNTER — Encounter (INDEPENDENT_AMBULATORY_CARE_PROVIDER_SITE_OTHER): Payer: Self-pay | Admitting: Surgery

## 2013-09-20 ENCOUNTER — Ambulatory Visit (INDEPENDENT_AMBULATORY_CARE_PROVIDER_SITE_OTHER): Payer: Medicare Other

## 2013-09-20 ENCOUNTER — Other Ambulatory Visit (INDEPENDENT_AMBULATORY_CARE_PROVIDER_SITE_OTHER): Payer: Medicare Other

## 2013-09-20 ENCOUNTER — Ambulatory Visit (INDEPENDENT_AMBULATORY_CARE_PROVIDER_SITE_OTHER): Payer: Medicare Other | Admitting: Internal Medicine

## 2013-09-20 VITALS — BP 120/72 | HR 76 | Temp 98.5°F | Resp 16 | Wt 231.0 lb

## 2013-09-20 VITALS — BP 130/82 | HR 78 | Temp 99.0°F | Ht 62.0 in | Wt 231.3 lb

## 2013-09-20 DIAGNOSIS — E669 Obesity, unspecified: Secondary | ICD-10-CM

## 2013-09-20 DIAGNOSIS — M545 Low back pain, unspecified: Secondary | ICD-10-CM | POA: Diagnosis not present

## 2013-09-20 DIAGNOSIS — J309 Allergic rhinitis, unspecified: Secondary | ICD-10-CM

## 2013-09-20 DIAGNOSIS — C184 Malignant neoplasm of transverse colon: Secondary | ICD-10-CM

## 2013-09-20 DIAGNOSIS — D510 Vitamin B12 deficiency anemia due to intrinsic factor deficiency: Secondary | ICD-10-CM

## 2013-09-20 DIAGNOSIS — E559 Vitamin D deficiency, unspecified: Secondary | ICD-10-CM | POA: Diagnosis not present

## 2013-09-20 DIAGNOSIS — R7309 Other abnormal glucose: Secondary | ICD-10-CM

## 2013-09-20 DIAGNOSIS — I10 Essential (primary) hypertension: Secondary | ICD-10-CM

## 2013-09-20 DIAGNOSIS — J45909 Unspecified asthma, uncomplicated: Secondary | ICD-10-CM

## 2013-09-20 DIAGNOSIS — Z23 Encounter for immunization: Secondary | ICD-10-CM | POA: Diagnosis not present

## 2013-09-20 DIAGNOSIS — R634 Abnormal weight loss: Secondary | ICD-10-CM

## 2013-09-20 DIAGNOSIS — J4540 Moderate persistent asthma, uncomplicated: Secondary | ICD-10-CM

## 2013-09-20 LAB — VITAMIN B12: VITAMIN B 12: 1230 pg/mL — AB (ref 211–911)

## 2013-09-20 LAB — HEMOGLOBIN A1C: Hgb A1c MFr Bld: 5.5 % (ref 4.6–6.5)

## 2013-09-20 LAB — BASIC METABOLIC PANEL
BUN: 15 mg/dL (ref 6–23)
CHLORIDE: 106 meq/L (ref 96–112)
CO2: 28 mEq/L (ref 19–32)
Calcium: 9.5 mg/dL (ref 8.4–10.5)
Creatinine, Ser: 1.1 mg/dL (ref 0.4–1.2)
GFR: 62.93 mL/min (ref >60.00)
Glucose, Bld: 88 mg/dL (ref 70–99)
POTASSIUM: 3.9 meq/L (ref 3.5–5.1)
SODIUM: 141 meq/L (ref 135–145)

## 2013-09-20 LAB — HEPATIC FUNCTION PANEL
ALT: 20 U/L (ref 0–35)
AST: 23 U/L (ref 0–37)
Albumin: 3.6 g/dL (ref 3.5–5.2)
Alkaline Phosphatase: 135 U/L — ABNORMAL HIGH (ref 39–117)
BILIRUBIN TOTAL: 0.6 mg/dL (ref 0.2–1.2)
Bilirubin, Direct: 0.1 mg/dL (ref 0.0–0.3)
Total Protein: 7.5 g/dL (ref 6.0–8.3)

## 2013-09-20 LAB — VITAMIN D 25 HYDROXY (VIT D DEFICIENCY, FRACTURES): VITD: 33.17 ng/mL (ref 30.00–100.00)

## 2013-09-20 MED ORDER — METRONIDAZOLE 500 MG PO TABS: 500.0000 mg | ORAL_TABLET | Freq: Three times a day (TID) | ORAL | Status: DC

## 2013-09-20 MED ORDER — METRONIDAZOLE 500 MG PO TABS: 500.0000 mg | ORAL_TABLET | Freq: Three times a day (TID) | ORAL | Status: AC

## 2013-09-20 MED ORDER — HYDROCODONE-ACETAMINOPHEN 5-325 MG PO TABS: ORAL_TABLET | ORAL | Status: DC

## 2013-09-20 MED ORDER — NEOMYCIN SULFATE 500 MG PO TABS: 1000.0000 mg | ORAL_TABLET | ORAL | Status: DC

## 2013-09-20 NOTE — Patient Instructions (Signed)
COLON BOWEL PREP                                                                          °Please follow the instructions carefully. It is important to clean out your bowels & take the prescribed antibiotic pills to lower your chances of a wound infection or abscess.  ° °FIVE DAYS PRIOR TO YOUR SURGERY ° °Stop eating any nuts, popcorn, or fruit with seeds. Stop all fiber supplements such as Metamucil, Citrucel, etc.   °Hold taking any blood thinning anticoagulation medication (ex: aspirin, warfarin/Coumadin, Plavix, Xarelto, Eliquis, Pradaxa, etc) as recommended by your medical/cardiology doctor ° °Obtain what you need at a pharmacy of your choice: °-Filled out prescriptions for your oral antibiotics (Neomycin & Metronidazole)  °-A bottle of MiraLax / Glycolax (288g) - no prescription required  °-A large bottle of Gatorade / Powerade (64oz)  °-Dulcolax tablets (4 tabs) - no prescription required  ° ° °DAY PRIOR TO SURGERY  ° °7:00am °Swallow 4 Dulcolax tablets with some water °Drink plenty of clear liquids all day to avoid getting dehydrated (Water, juice, soda, coffee, tea, bouillon, jello, etc.) ° °10:00am °Mix the bottle of MiraLax with the 64-oz bottle of Gatorade.  Drink the Gatorade mixture gradually over the next few hours (8oz glass every 15-30 minutes) until gone. You should finish by 2pm. ° °2:00pm °Take 2 Neomycin 500mg tablets & 2 Metronidazole 500mg tablets ° °3:00pm °Take 2 Neomycin 500mg tablets & 2 Metronidazole 500mg tablets  °Drink plenty of clear liquids all evening to avoid getting dehydrated ° °10:00pm °Take 2 Neomycin 500mg tablets & 2 Metronidazole 500mg tablets  °Do not eat or drink anything after bedtime (midnight) the night before your surgery. ° ° °MORNING OF SURGERY °Remember to not to drink or eat anything that morning  °Hold or take medications as recommended by the hospital staff at your Preoperative visit ° °If you have questions or concerns, please call [facility] [phone] during business hours to speak to the clinical staff for advice. °

## 2013-09-20 NOTE — Progress Notes (Signed)
Pre visit review using our clinic review tool, if applicable. No additional management support is needed unless otherwise documented below in the visit note. 

## 2013-09-20 NOTE — Assessment & Plan Note (Signed)
Continue with current prescription therapy as reflected on the Med list.  

## 2013-09-20 NOTE — Assessment & Plan Note (Signed)
A1c
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
## 2013-09-20 NOTE — Assessment & Plan Note (Signed)
Labs  Continue with current prescription therapy as reflected on the Med list.  

## 2013-09-20 NOTE — Assessment & Plan Note (Addendum)
Doing well  Dr Georgette Dover has recommend colostomy reversal with connection of her possible transverse colon to the distal descending colon

## 2013-09-20 NOTE — Assessment & Plan Note (Signed)
Wt Readings from Last 3 Encounters:  09/20/13 231 lb (104.781 kg)  09/20/13 231 lb 5 oz (104.923 kg)  08/31/13 229 lb 9.6 oz (104.146 kg)

## 2013-09-22 ENCOUNTER — Telehealth: Payer: Self-pay | Admitting: *Deleted

## 2013-09-22 ENCOUNTER — Ambulatory Visit: Payer: Medicare Other | Admitting: Internal Medicine

## 2013-09-22 ENCOUNTER — Other Ambulatory Visit: Payer: Self-pay | Admitting: *Deleted

## 2013-09-22 NOTE — Telephone Encounter (Signed)
Faxed refill request back to [facility]- Notified them "course completed"

## 2013-09-22 NOTE — Telephone Encounter (Signed)
Refill request for Capecitbine received from [facility]. Request taken to Dr Benay Spice

## 2013-09-23 ENCOUNTER — Telehealth: Payer: Self-pay | Admitting: Geriatric Medicine

## 2013-09-23 NOTE — Progress Notes (Signed)
Subjective:    F/u feet pain/burning w/chemo - better  F/u colon ca - feeling better.   F/u  R knee pain - may need a TKR per Dr Marlou Sa   Hypertension This is a chronic problem. The current episode started more than 1 year ago. The problem is unchanged. Pertinent negatives include no headaches, neck pain, palpitations or shortness of breath. There is no history of kidney disease.   The patient presents for a follow-up of  chronic GERD, asthma controlled with medicines  BP Readings from Last 3 Encounters:  09/20/13 120/72  09/20/13 130/82  08/31/13 123/74   Wt Readings from Last 3 Encounters:  09/20/13 231 lb (104.781 kg)  09/20/13 231 lb 5 oz (104.923 kg)  08/31/13 229 lb 9.6 oz (104.146 kg)    Review of Systems  Constitutional: Negative.  Negative for fever, chills, diaphoresis, activity change, appetite change and unexpected weight change.  HENT: Negative for congestion, ear pain, facial swelling, hearing loss, mouth sores, nosebleeds, postnasal drip, rhinorrhea, sinus pressure, sneezing, sore throat, tinnitus and trouble swallowing.   Eyes: Negative for pain, discharge, redness, itching and visual disturbance.  Respiratory: Negative for cough, chest tightness, shortness of breath, wheezing and stridor.   Cardiovascular: Negative for palpitations and leg swelling.  Gastrointestinal: Negative for nausea, diarrhea, constipation, blood in stool, abdominal distention, anal bleeding and rectal pain.  Genitourinary: Negative for dysuria, urgency, frequency, hematuria, flank pain, vaginal bleeding, vaginal discharge, difficulty urinating, genital sores and pelvic pain.  Musculoskeletal: Negative for arthralgias, back pain, gait problem, joint swelling, neck pain and neck stiffness.       L arm in the biceps area as above  Skin: Negative.  Negative for rash.  Neurological: Negative for dizziness, tremors, seizures, syncope, speech difficulty, weakness and headaches.  Hematological:  Negative for adenopathy. Does not bruise/bleed easily.  Psychiatric/Behavioral: Negative for suicidal ideas, behavioral problems, sleep disturbance, dysphoric mood and decreased concentration. The patient is not nervous/anxious.        Objective:   Physical Exam  Constitutional: She appears well-developed. No distress.  Obese  HENT:  Head: Normocephalic.  Right Ear: External ear normal.  Left Ear: External ear normal.  Nose: Nose normal.  Mouth/Throat: Oropharynx is clear and moist.  Eyes: Conjunctivae are normal. Pupils are equal, round, and reactive to light. Right eye exhibits no discharge. Left eye exhibits no discharge.  Neck: Normal range of motion. Neck supple. No JVD present. No tracheal deviation present. No thyromegaly present.  Cardiovascular: Normal rate, regular rhythm and normal heart sounds.   Pulmonary/Chest: No stridor. No respiratory distress. She has no wheezes.  Abdominal: Soft. Bowel sounds are normal. She exhibits no distension and no mass. There is no tenderness. There is no rebound and no guarding.  Musculoskeletal: She exhibits no edema and no tenderness.  Lymphadenopathy:    She has no cervical adenopathy.  Neurological: She displays normal reflexes. No cranial nerve deficit. She exhibits normal muscle tone. Coordination normal.  Skin: No rash noted. No erythema.  Psychiatric: She has a normal mood and affect. Her behavior is normal. Judgment and thought content normal.   Abd scars and R colostomy B feet and ankles deformed, no rash  Lab Results  Component Value Date   WBC 5.7 08/31/2013   HGB 12.0 08/31/2013   HCT 38.2 08/31/2013   PLT 218 08/31/2013   GLUCOSE 88 09/20/2013   CHOL 144 07/14/2012   TRIG 59.0 07/14/2012   HDL 76.60 07/14/2012   LDLCALC 56 07/14/2012  ALT 20 09/20/2013   AST 23 09/20/2013   NA 141 09/20/2013   K 3.9 09/20/2013   CL 106 09/20/2013   CREATININE 1.1 09/20/2013   BUN 15 09/20/2013   CO2 28 09/20/2013   TSH 1.91 01/27/2013   INR 1.07  02/02/2013   HGBA1C 5.5 09/20/2013         Assessment & Plan:

## 2013-09-23 NOTE — Telephone Encounter (Signed)
Spoke with patient and informed her of normal lab results.

## 2013-09-23 NOTE — Telephone Encounter (Signed)
Message copied by Alger Memos on Thu Sep 23, 2013  2:23 PM ------      Message from: Cassandria Anger      Created: Wed Sep 22, 2013  7:50 AM       Erline Levine, please, inform patient that all labs are OK      Thank you!       ------

## 2013-09-28 ENCOUNTER — Other Ambulatory Visit (INDEPENDENT_AMBULATORY_CARE_PROVIDER_SITE_OTHER): Payer: Self-pay | Admitting: Surgery

## 2013-09-28 ENCOUNTER — Telehealth: Payer: Self-pay | Admitting: Oncology

## 2013-09-28 ENCOUNTER — Ambulatory Visit (HOSPITAL_BASED_OUTPATIENT_CLINIC_OR_DEPARTMENT_OTHER): Payer: Medicare Other | Admitting: Nurse Practitioner

## 2013-09-28 ENCOUNTER — Ambulatory Visit (HOSPITAL_BASED_OUTPATIENT_CLINIC_OR_DEPARTMENT_OTHER): Payer: Medicare Other

## 2013-09-28 ENCOUNTER — Ambulatory Visit (INDEPENDENT_AMBULATORY_CARE_PROVIDER_SITE_OTHER): Payer: Medicare Other

## 2013-09-28 VITALS — BP 131/54 | HR 57 | Temp 97.0°F | Resp 18 | Ht 62.0 in | Wt 233.2 lb

## 2013-09-28 DIAGNOSIS — D509 Iron deficiency anemia, unspecified: Secondary | ICD-10-CM | POA: Diagnosis not present

## 2013-09-28 DIAGNOSIS — C184 Malignant neoplasm of transverse colon: Secondary | ICD-10-CM

## 2013-09-28 DIAGNOSIS — J309 Allergic rhinitis, unspecified: Secondary | ICD-10-CM

## 2013-09-28 DIAGNOSIS — C185 Malignant neoplasm of splenic flexure: Secondary | ICD-10-CM | POA: Diagnosis not present

## 2013-09-28 LAB — CEA: CEA: 0.8 ng/mL (ref 0.0–5.0)

## 2013-09-28 NOTE — Telephone Encounter (Signed)
[facility] ADN PRINTED APPT SCHED AND AVS FOR PT FOR jAN 2016....[facility] PT BARIUM....SENT PT TO LAB

## 2013-09-28 NOTE — Progress Notes (Signed)
  [facility] OFFICE PROGRESS NOTE   Diagnosis:  Colon cancer.  INTERVAL HISTORY:   [name] returns as scheduled. She completed cycle 8 of adjuvant Xeloda beginning 09/07/2013. She denies nausea/vomiting. No mouth sores. No diarrhea. No hand pain or redness. She notes redness over the feet. No associated pain. She reports a cough for 3 weeks, initially productive, now nonproductive. No fever or shortness of breath.  Objective:  Vital signs in last 24 hours:  Blood pressure 131/54, pulse 57, temperature 97 F (36.1 C), temperature source Oral, resp. rate 18, height _0  (1.575 m), weight 233 lb 3.2 oz (105.779 kg), SpO2 100.00%.    HEENT: No thrush or ulcers. Lymphatics: No palpable cervical, supraclavicular, axillary or inguinal lymph nodes. Resp: Lungs clear bilaterally. Cardio: Regular rate and rhythm. GI: Abdomen soft and nontender. No hepatomegaly. Right lower quadrant colostomy. Vascular: No leg edema.  Skin: Palms and soles with hyperpigmentation. Palms without erythema. Soles erythematous with dry desquamation at the heels.    Lab Results:  Lab Results  Component Value Date   WBC 5.7 08/31/2013   HGB 12.0 08/31/2013   HCT 38.2 08/31/2013   MCV 82.6 08/31/2013   PLT 218 08/31/2013   NEUTROABS 3.2 08/31/2013    Imaging:  No results found.  Medications: I have reviewed the patient's current medications.  Assessment/Plan: 1. 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  Presentation to the emergency room 02/02/2013 with a colonic obstruction secondary to tumor at the splenic flexure.  Baseline CEA on 02/02/2013 less than 0.5.  Initiation of adjuvant Xeloda 04/10/2013.  Cycle 2 adjuvant Xeloda 05/01/2013.    Cycle 3 adjuvant Xeloda 05/22/2013.  Cycle 4 adjuvant Xeloda 06/12/2013.  Cycle 5 adjuvant Xeloda 07/03/2013 (Xeloda dose reduced due to hand foot syndrome).  Cycle 6 adjuvant Xeloda 07/24/2013.  Cycle 7 adjuvant Xeloda 08/14/2013  Cycle 8 adjuvant Xeloda 09/07/2013. 2. Iron deficiency anemia. She continues oral iron. Improved. 3. Asthma. 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    Disposition: [name] appears stable. She has completed the course of adjuvant chemotherapy. We will obtain a baseline CEA today.  [name] recommends CT scans of the chest/abdomen/pelvis annually for 3 years beginning one year from diagnosis. We will schedule her for labs and CT scans in January 2016 with a followup visit several days later to review the results.  She did not have a full colonoscopy at diagnosis. We will contact Dr. Gershon Crane to see if he would like to have this done prior to the ostomy reversal surgery. Otherwise we will make a referral to Dr. Ardis Hughs to have this scheduled in the next 2-3 months.  She will contact the office prior to her next visit with any problems.  Plan reviewed with [name]. 25 minutes were spent face-to-face at today's visit with the majority of the time involved in counseling/coordination of care.    Ned Card ANP/GNP-BC   09/28/2013  11:45 AM

## 2013-09-29 ENCOUNTER — Telehealth: Payer: Self-pay | Admitting: Internal Medicine

## 2013-09-29 ENCOUNTER — Other Ambulatory Visit: Payer: Self-pay | Admitting: Nurse Practitioner

## 2013-09-29 DIAGNOSIS — C184 Malignant neoplasm of transverse colon: Secondary | ICD-10-CM

## 2013-09-29 NOTE — Telephone Encounter (Signed)
Called spoke with pt. She c/o nasal cong, blowing clear phlem out, PND, prod cough-yellowphlem, sneezing x 2 weeks. Pt has not tried anything OTC. Please advise CDY thanks  No Known Allergies   Current Outpatient Prescriptions on File Prior to Visit  Medication Sig Dispense Refill  . albuterol (PROAIR HFA) 108 (90 BASE) MCG/ACT inhaler Inhale 2 puffs into the lungs every 6 (six) hours as needed for wheezing or shortness of breath.  3 Inhaler  3  . aspirin 81 MG tablet Take 81 mg by mouth daily.        . cetirizine (ZYRTEC) 10 MG tablet Take 1 tablet (10 mg total) by mouth daily.  100 tablet  3  . Cholecalciferol (VITAMIN D) 1000 UNITS capsule Take 1,000 Units by mouth daily.        . Cyanocobalamin (VITAMIN B-12) 1000 MCG SUBL Place 1 tablet (1,000 mcg total) under the tongue daily.  100 tablet  3  . ferrous sulfate 325 (65 FE) MG tablet Take 325 mg by mouth 2 (two) times daily with a meal.       . Fluticasone-Salmeterol (ADVAIR DISKUS) 250-50 MCG/DOSE AEPB 1 puff, then rinse mouth, twice daily  180 each  3  . furosemide (LASIX) 20 MG tablet Take 1-2 tablets (20-40 mg total) by mouth daily as needed.  180 tablet  3  . HYDROcodone-acetaminophen (NORCO/VICODIN) 5-325 MG per tablet Take one tablet by mouth every 6 hours as needed for pain  120 tablet  0  . mometasone (NASONEX) 50 MCG/ACT nasal spray Place 2 sprays into the nose daily as needed.  17 g  3  . montelukast (SINGULAIR) 10 MG tablet Take 1 tablet (10 mg total) by mouth at bedtime.  90 tablet  3  . neomycin (MYCIFRADIN) 500 MG tablet Take 2 tablets (1,000 mg total) by mouth as directed. Take 2 pills (=1000mg ) by mouth at 2pm, 3pm, and 10pm the day before your colorectal operation as discussed in [place] office.  Call [phone] with questions  6 tablet  0  . omeprazole (PRILOSEC) 20 MG capsule Take 1 capsule (20 mg total) by mouth daily.  90 capsule  3  . potassium chloride (K-DUR) 10 MEQ tablet Take 1 tablet (10 mEq total) by mouth daily.   90 tablet  3  . promethazine-codeine (PHENERGAN WITH CODEINE) 6.25-10 MG/5ML syrup Take 6ml by mouth every 6 hours as needed for cough  300 mL  0   No current facility-administered medications on file prior to visit.

## 2013-09-29 NOTE — Telephone Encounter (Signed)
Suggest allegra 180, 1 daily                Sudfed-PE otc, one each morning                Mucinex-DM

## 2013-09-29 NOTE — Telephone Encounter (Signed)
Called and spoke with pt and she is aware of CY recs.  She stated that she will try the sudafed PE and mucinex.  She stated that she cannot afford the allegra but she will try the other meds for now.

## 2013-10-01 ENCOUNTER — Encounter: Payer: Self-pay | Admitting: Internal Medicine

## 2013-10-01 ENCOUNTER — Telehealth: Payer: Self-pay | Admitting: Oncology

## 2013-10-01 ENCOUNTER — Telehealth: Payer: Self-pay | Admitting: Internal Medicine

## 2013-10-01 NOTE — Telephone Encounter (Signed)
I have scheduled the colonoscopy with the patient for 11/17/13 3:30 and pre-visit for 11/01/13 2:30 I have notified Shameka of the appt date and time for both.

## 2013-10-01 NOTE — Telephone Encounter (Signed)
Pt sched with Dr Carlean Purl on 10.5 @ 2:30am..the patient aware

## 2013-10-01 NOTE — Telephone Encounter (Signed)
[name] and she is a pt of Dr. Carlean Purl and the nurse will call me back with appt

## 2013-10-04 ENCOUNTER — Encounter: Payer: Self-pay | Admitting: Internal Medicine

## 2013-10-05 ENCOUNTER — Ambulatory Visit (INDEPENDENT_AMBULATORY_CARE_PROVIDER_SITE_OTHER): Payer: Medicare Other

## 2013-10-05 DIAGNOSIS — J309 Allergic rhinitis, unspecified: Secondary | ICD-10-CM

## 2013-10-07 ENCOUNTER — Other Ambulatory Visit (INDEPENDENT_AMBULATORY_CARE_PROVIDER_SITE_OTHER): Payer: Self-pay | Admitting: Surgery

## 2013-10-07 ENCOUNTER — Encounter (HOSPITAL_COMMUNITY): Payer: Self-pay

## 2013-10-07 NOTE — Progress Notes (Signed)
Please put orders in Epic surgery 10-12-13 pre op 10-11-13 Thanks

## 2013-10-08 ENCOUNTER — Encounter (HOSPITAL_COMMUNITY): Payer: Self-pay

## 2013-10-08 ENCOUNTER — Encounter (HOSPITAL_COMMUNITY): Payer: Self-pay | Admitting: Pharmacy Technician

## 2013-10-08 ENCOUNTER — Ambulatory Visit (HOSPITAL_COMMUNITY)
Admission: RE | Admit: 2013-10-08 | Discharge: 2013-10-08 | Disposition: A | Discharge status: Home / Self Care | Payer: Medicare Other | Source: Ambulatory Visit | Attending: Anesthesiology | Admitting: Anesthesiology

## 2013-10-08 ENCOUNTER — Other Ambulatory Visit (HOSPITAL_COMMUNITY): Payer: Self-pay | Admitting: Anesthesiology

## 2013-10-08 ENCOUNTER — Encounter (HOSPITAL_COMMUNITY)
Admission: RE | Admit: 2013-10-08 | Discharge: 2013-10-08 | Disposition: A | Discharge status: Home / Self Care | Payer: Medicare Other | Source: Ambulatory Visit | Attending: Surgery | Admitting: Surgery

## 2013-10-08 DIAGNOSIS — Z01818 Encounter for other preprocedural examination: Secondary | ICD-10-CM | POA: Diagnosis not present

## 2013-10-08 DIAGNOSIS — Z01811 Encounter for preprocedural respiratory examination: Secondary | ICD-10-CM | POA: Diagnosis not present

## 2013-10-08 LAB — BASIC METABOLIC PANEL
Anion gap: 12 (ref 5–15)
BUN: 17 mg/dL (ref 6–23)
CO2: 27 mEq/L (ref 19–32)
CREATININE: 1.01 mg/dL (ref 0.50–1.10)
Calcium: 9.7 mg/dL (ref 8.4–10.5)
Chloride: 104 mEq/L (ref 96–112)
GFR, EST AFRICAN AMERICAN: 67 mL/min — AB (ref >90)
GFR, EST NON AFRICAN AMERICAN: 58 mL/min — AB (ref >90)
Glucose, Bld: 91 mg/dL (ref 70–99)
POTASSIUM: 3.6 meq/L — AB (ref 3.7–5.3)
Sodium: 143 mEq/L (ref 137–147)

## 2013-10-08 LAB — CBC
HCT: 39.7 % (ref 36.0–46.0)
Hemoglobin: 12.9 g/dL (ref 12.0–15.0)
MCH: 27 pg (ref 26.0–34.0)
MCHC: 32.5 g/dL (ref 30.0–36.0)
MCV: 83.1 fL (ref 78.0–100.0)
Platelets: 211 10*3/uL (ref 150–400)
RBC: 4.78 MIL/uL (ref 3.87–5.11)
RDW: 15.8 % — AB (ref 11.5–15.5)
WBC: 5.3 10*3/uL (ref 4.0–10.5)

## 2013-10-08 NOTE — Progress Notes (Signed)
Chest xray 02-09-13 abnormal, will repeat with pre op 10-08-13 Echo 02-07-13 epic ekg 02-03-13 epic bmet and hepatic function panel 09-20-13 epic

## 2013-10-08 NOTE — Patient Instructions (Addendum)
20 [name]  10/08/2013   Your procedure is scheduled on: Tuesday September 15th, 2015  Report to [facility] Main Entrance and follow signs to  [place] at 1015 AM.  Call this number if you have problems the morning of surgery [phone]              BRING CPAP MASK AND TUBING   Remember: follow all bowel pre instructions from dr Georgette Dover  Do not eat food:After Midnight Sunday night clear liquids all day Monday 10-11-13, no clear liquids after midnight Monday night     Take these medicines the morning of surgery with A SIP OF WATER: ALBUTEROL INHALER IF NEEDED AND BRING INHALER AND LEAVE WITH YOUR FAMILY, ADVAIR, NASONEX NASAL SPRAY IF NEEDED, OMEPRAZOLE, ALBUTEROL NEBULIZER.                               You may not have any metal on your body including hair pins and piercings  Do not wear jewelry, make-up, lotions, powders, or deodorant.   Men may shave face and neck.  Do not bring valuables to the hospital. [place].  Contacts, dentures or bridgework may not be worn into surgery.  Leave suitcase in the car. After surgery it may be brought to your room.  For patients admitted to the hospital, checkout time is 11:00 AM the day of discharge.   Patients discharged the day of surgery will not be allowed to drive home.  Name and phone number of your driver:  Special Instructions: N/A ________________________________________________________________________  [facility] - Preparing for Surgery Before surgery, you can play an important role.  Because skin is not sterile, your skin needs to be as free of germs as possible.  You can reduce the number of germs on your skin by washing with CHG (chlorahexidine gluconate) soap before surgery.  CHG is an antiseptic cleaner which kills germs and bonds with the skin to continue killing germs even after washing. Please DO NOT use if you have an allergy to CHG or antibacterial soaps.  If your  skin becomes reddened/irritated stop using the CHG and inform your nurse when you arrive at Short Stay. Do not shave (including legs and underarms) for at least 48 hours prior to the first CHG shower.  You may shave your face/neck. Please follow these instructions carefully:  1.  Shower with CHG Soap the night before surgery and the  morning of Surgery.  2.  If you choose to wash your hair, wash your hair first as usual with your  normal  shampoo.  3.  After you shampoo, rinse your hair and body thoroughly to remove the  shampoo.                           4.  Use CHG as you would any other liquid soap.  You can apply chg directly  to the skin and wash                       Gently with a scrungie or clean washcloth.  5.  Apply the CHG Soap to your body ONLY FROM THE NECK DOWN.   Do not use on face/ open  Wound or open sores. Avoid contact with eyes, ears mouth and genitals (private parts).                       Wash face,  Genitals (private parts) with your normal soap.             6.  Wash thoroughly, paying special attention to the area where your surgery  will be performed.  7.  Thoroughly rinse your body with warm water from the neck down.  8.  DO NOT shower/wash with your normal soap after using and rinsing off  the CHG Soap.                9.  Pat yourself dry with a clean towel.            10.  Wear clean pajamas.            11.  Place clean sheets on your bed the night of your first shower and do not  sleep with pets. Day of Surgery : Do not apply any lotions/deodorants the morning of surgery.  Please wear clean clothes to the hospital/surgery center.  FAILURE TO FOLLOW THESE INSTRUCTIONS MAY RESULT IN THE CANCELLATION OF YOUR SURGERY PATIENT SIGNATURE_________________________________  NURSE SIGNATURE__________________________________  ________________________________________________________________________    CLEAR LIQUID DIET   Foods Allowed                                                                      Foods Excluded  Coffee and tea, regular and decaf                             liquids that you cannot  Plain Jell-O in any flavor                                             see through such as: Fruit ices (not with fruit pulp)                                     milk, soups, orange juice  Iced Popsicles                                    All solid food Carbonated beverages, regular and diet                                    Cranberry, grape and apple juices Sports drinks like Gatorade Lightly seasoned clear broth or consume(fat free) Sugar, honey syrup  Sample Menu Breakfast                                Lunch  Supper Cranberry juice                    Beef broth                            Chicken broth Jell-O                                     Grape juice                           Apple juice Coffee or tea                        Jell-O                                      Popsicle                                                Coffee or tea                        Coffee or tea  _____________________________________________________________________

## 2013-10-11 ENCOUNTER — Inpatient Hospital Stay (HOSPITAL_COMMUNITY): Admission: RE | Admit: 2013-10-11 | Payer: Medicare Other | Source: Ambulatory Visit

## 2013-10-12 ENCOUNTER — Ambulatory Visit: Payer: Medicare Other

## 2013-10-12 ENCOUNTER — Other Ambulatory Visit (INDEPENDENT_AMBULATORY_CARE_PROVIDER_SITE_OTHER): Payer: Self-pay | Admitting: Surgery

## 2013-10-12 ENCOUNTER — Encounter (HOSPITAL_COMMUNITY): Admission: RE | Payer: Self-pay | Source: Ambulatory Visit

## 2013-10-12 ENCOUNTER — Inpatient Hospital Stay (HOSPITAL_COMMUNITY): Admission: RE | Admit: 2013-10-12 | Payer: Medicare Other | Source: Ambulatory Visit | Admitting: Surgery

## 2013-10-12 SURGERY — COLOSTOMY REVERSAL
Anesthesia: General

## 2013-10-12 NOTE — Consult Note (Signed)
  In reviewing the patient's records for today, it appears that she has not yet had her colonoscopy to clear the right and proximal transverse colon.  She is scheduled for colonoscopy by Dr. Carlean Purl on 10/21.  We will postpone the procedure until after her colonoscopy.  I spoke with the patient and she understands.  [name]. Georgette Dover, MD, [facility] Surgery  General/ Trauma Surgery  10/12/2013 7:20 AM

## 2013-10-13 ENCOUNTER — Ambulatory Visit (INDEPENDENT_AMBULATORY_CARE_PROVIDER_SITE_OTHER): Payer: Medicare Other

## 2013-10-13 DIAGNOSIS — J309 Allergic rhinitis, unspecified: Secondary | ICD-10-CM | POA: Diagnosis not present

## 2013-10-19 ENCOUNTER — Ambulatory Visit: Payer: Medicare Other

## 2013-10-20 ENCOUNTER — Ambulatory Visit: Payer: Medicare Other

## 2013-10-29 ENCOUNTER — Ambulatory Visit (INDEPENDENT_AMBULATORY_CARE_PROVIDER_SITE_OTHER): Payer: Medicare Other

## 2013-10-29 DIAGNOSIS — J309 Allergic rhinitis, unspecified: Secondary | ICD-10-CM | POA: Diagnosis not present

## 2013-10-30 ENCOUNTER — Other Ambulatory Visit: Payer: Self-pay | Admitting: Internal Medicine

## 2013-11-01 ENCOUNTER — Ambulatory Visit (AMBULATORY_SURGERY_CENTER): Payer: Self-pay

## 2013-11-01 ENCOUNTER — Ambulatory Visit (INDEPENDENT_AMBULATORY_CARE_PROVIDER_SITE_OTHER): Payer: Medicare Other

## 2013-11-01 VITALS — Ht 62.0 in | Wt 235.0 lb

## 2013-11-01 DIAGNOSIS — Z85038 Personal history of other malignant neoplasm of large intestine: Secondary | ICD-10-CM

## 2013-11-01 DIAGNOSIS — J309 Allergic rhinitis, unspecified: Secondary | ICD-10-CM

## 2013-11-01 NOTE — Progress Notes (Signed)
No allergies to eggs or soy No home oxygen No diet/weight loss meds No past problems with anesthesia  No email  Emmi instructions given for colonoscopy 

## 2013-11-04 ENCOUNTER — Encounter: Payer: Self-pay | Admitting: Internal Medicine

## 2013-11-11 ENCOUNTER — Encounter (HOSPITAL_COMMUNITY): Payer: Self-pay

## 2013-11-11 ENCOUNTER — Encounter (HOSPITAL_COMMUNITY): Payer: Self-pay | Admitting: Pharmacy Technician

## 2013-11-11 ENCOUNTER — Encounter (HOSPITAL_COMMUNITY)
Admission: RE | Admit: 2013-11-11 | Discharge: 2013-11-11 | Disposition: A | Discharge status: Home / Self Care | Payer: Medicare Other | Source: Ambulatory Visit | Attending: Surgery | Admitting: Surgery

## 2013-11-11 ENCOUNTER — Telehealth (INDEPENDENT_AMBULATORY_CARE_PROVIDER_SITE_OTHER): Payer: Self-pay

## 2013-11-11 ENCOUNTER — Ambulatory Visit (INDEPENDENT_AMBULATORY_CARE_PROVIDER_SITE_OTHER): Payer: Medicare Other

## 2013-11-11 ENCOUNTER — Other Ambulatory Visit (HOSPITAL_COMMUNITY): Payer: Self-pay | Admitting: *Deleted

## 2013-11-11 DIAGNOSIS — Z01812 Encounter for preprocedural laboratory examination: Secondary | ICD-10-CM | POA: Insufficient documentation

## 2013-11-11 DIAGNOSIS — J309 Allergic rhinitis, unspecified: Secondary | ICD-10-CM | POA: Diagnosis not present

## 2013-11-11 HISTORY — DX: Malignant (primary) neoplasm, unspecified: C80.1

## 2013-11-11 HISTORY — DX: Pneumonia, unspecified organism: J18.9

## 2013-11-11 HISTORY — DX: Chronic obstructive pulmonary disease, unspecified: J44.9

## 2013-11-11 HISTORY — DX: Cardiac murmur, unspecified: R01.1

## 2013-11-11 HISTORY — DX: Unspecified osteoarthritis, unspecified site: M19.90

## 2013-11-11 HISTORY — DX: Anemia, unspecified: D64.9

## 2013-11-11 LAB — BASIC METABOLIC PANEL
Anion gap: 13 (ref 5–15)
BUN: 17 mg/dL (ref 6–23)
CO2: 25 mEq/L (ref 19–32)
CREATININE: 0.93 mg/dL (ref 0.50–1.10)
Calcium: 9.5 mg/dL (ref 8.4–10.5)
Chloride: 105 mEq/L (ref 96–112)
GFR calc non Af Amer: 64 mL/min — ABNORMAL LOW (ref >90)
GFR, EST AFRICAN AMERICAN: 74 mL/min — AB (ref >90)
Glucose, Bld: 104 mg/dL — ABNORMAL HIGH (ref 70–99)
Potassium: 3.9 mEq/L (ref 3.7–5.3)
Sodium: 143 mEq/L (ref 137–147)

## 2013-11-11 LAB — CBC
HCT: 39.6 % (ref 36.0–46.0)
Hemoglobin: 12.8 g/dL (ref 12.0–15.0)
MCH: 26 pg (ref 26.0–34.0)
MCHC: 32.3 g/dL (ref 30.0–36.0)
MCV: 80.3 fL (ref 78.0–100.0)
Platelets: 234 10*3/uL (ref 150–400)
RBC: 4.93 MIL/uL (ref 3.87–5.11)
RDW: 14.7 % (ref 11.5–15.5)
WBC: 5.2 10*3/uL (ref 4.0–10.5)

## 2013-11-11 NOTE — Pre-Procedure Instructions (Signed)
[name]  11/11/2013   Your procedure is scheduled on:  Thursday, November 25, 2013 at 7:45 AM.   Report to [facility] Entrance "A" Admitting Office at 5:45 AM.   Call this number if you have problems the morning of surgery: [phone]   Remember:   Do not eat food or drink liquids after midnight Wednesday, 11/24/13.   Take these medicines the morning of surgery with A SIP OF WATER: cetirizine (ZYRTEC), omeprazole (PRILOSEC), albuterol (2.5 MG/3ML) 0.083% NEBU 3 mL, albuterol (5 MG/ML) 0.5% NEBU, albuterol (PROAIR HFA) inhaler - if needed, HYDROcodone-acetaminophen (NORCO/VICODIN) - if needed, mometasone (NASONEX) - if needed, Fluticasone-Salmeterol (ADVAIR DISKUS) - if needed  Stop Aspirin as of Thursday, 11/18/13.   Do not wear jewelry, make-up or nail polish.  Do not wear lotions, powders, or perfumes. You may wear deodorant.  Do not shave 48 hours prior to surgery.   Do not bring valuables to the hospital.  [facility] is not responsible                  for any belongings or valuables.               Contacts, dentures or bridgework may not be worn into surgery.  Leave suitcase in the car. After surgery it may be brought to your room.  For patients admitted to the hospital, discharge time is determined by your                treatment team.                   Special Instructions: [place] - Preparing for Surgery  Before surgery, you can play an important role.  Because skin is not sterile, your skin needs to be as free of germs as possible.  You can reduce the number of germs on you skin by washing with CHG (chlorahexidine gluconate) soap before surgery.  CHG is an antiseptic cleaner which kills germs and bonds with the skin to continue killing germs even after washing.  Please DO NOT use if you have an allergy to CHG or antibacterial soaps.  If your skin becomes reddened/irritated stop using the CHG and inform your nurse when you arrive at Short Stay.  Do  not shave (including legs and underarms) for at least 48 hours prior to the first CHG shower.  You may shave your face.  Please follow these instructions carefully:   1.  Shower with CHG Soap the night before surgery and the                                morning of Surgery.  2.  If you choose to wash your hair, wash your hair first as usual with your       normal shampoo.  3.  After you shampoo, rinse your hair and body thoroughly to remove the                      Shampoo.  4.  Use CHG as you would any other liquid soap.  You can apply chg directly       to the skin and wash gently with scrungie or a clean washcloth.  5.  Apply the CHG Soap to your body ONLY FROM THE NECK DOWN.        Do not use on open wounds or open sores.  Avoid contact with your eyes, ears, mouth and genitals (private parts).  Wash genitals (private parts) with your normal soap.  6.  Wash thoroughly, paying special attention to the area where your surgery        will be performed.  7.  Thoroughly rinse your body with warm water from the neck down.  8.  DO NOT shower/wash with your normal soap after using and rinsing off       the CHG Soap.  9.  Pat yourself dry with a clean towel.            10.  Wear clean pajamas.            11.  Place clean sheets on your bed the night of your first shower and do not        sleep with pets.  Day of Surgery  Do not apply any lotions the morning of surgery.  Please wear clean clothes to the hospital.     Please read over the following fact sheets that you were given: Pain Booklet, Coughing and Deep Breathing and Surgical Site Infection Prevention

## 2013-11-11 NOTE — Progress Notes (Signed)
Pt's surgery was originally scheduled for [facility], but now scheduled for here at [facility]. Order for consent had been released, called Dr. Georgette Dover to verify that there were no changes to the consent order. After reading the order to him, he confirmed that there were no changes.

## 2013-11-11 NOTE — Telephone Encounter (Signed)
Pt has been rescheduled for colostomy reversal on 10/29.  Do same bowel prep instructions apply?  If so, she will need new Rx for antibiotics.  She has directions for the prep.  Please advise so we can get Rx to her.

## 2013-11-11 NOTE — Telephone Encounter (Signed)
Yes  Neomycin and Flagyl

## 2013-11-12 NOTE — Telephone Encounter (Signed)
Made the patient aware the Rx has been e-prescribed to her pharmacy.

## 2013-11-17 ENCOUNTER — Ambulatory Visit (INDEPENDENT_AMBULATORY_CARE_PROVIDER_SITE_OTHER): Payer: Medicare Other

## 2013-11-17 ENCOUNTER — Ambulatory Visit (AMBULATORY_SURGERY_CENTER): Payer: Medicare Other | Admitting: Internal Medicine

## 2013-11-17 ENCOUNTER — Encounter: Payer: Self-pay | Admitting: Internal Medicine

## 2013-11-17 VITALS — BP 103/66 | HR 51 | Temp 97.5°F | Resp 19 | Ht 62.0 in | Wt 235.0 lb

## 2013-11-17 DIAGNOSIS — J309 Allergic rhinitis, unspecified: Secondary | ICD-10-CM

## 2013-11-17 DIAGNOSIS — J449 Chronic obstructive pulmonary disease, unspecified: Secondary | ICD-10-CM | POA: Diagnosis not present

## 2013-11-17 DIAGNOSIS — Z1211 Encounter for screening for malignant neoplasm of colon: Secondary | ICD-10-CM | POA: Diagnosis not present

## 2013-11-17 DIAGNOSIS — G4733 Obstructive sleep apnea (adult) (pediatric): Secondary | ICD-10-CM | POA: Diagnosis not present

## 2013-11-17 DIAGNOSIS — Z85038 Personal history of other malignant neoplasm of large intestine: Secondary | ICD-10-CM | POA: Insufficient documentation

## 2013-11-17 MED ORDER — SODIUM CHLORIDE 0.9 % IV SOLN: 500.0000 mL | INTRAVENOUS | Status: DC

## 2013-11-17 NOTE — Progress Notes (Signed)
A/ox3 pleased with MAC, report to Robbin RN 

## 2013-11-17 NOTE — Patient Instructions (Addendum)
No polyps or tumors seen.  Good luck with colostomy takedown!  Next routine colonoscopy in 3 years - 2018  I appreciate the opportunity to care for you. Gatha Mayer, MD, [facility]  Resume current medications. Call us with any questions or concerns. Thank you!  YOU HAD AN ENDOSCOPIC PROCEDURE TODAY AT [facility]: Refer to the procedure report that was given to you for any specific questions about what was found during the examination.  If the procedure report does not answer your questions, please call your gastroenterologist to clarify.  If you requested that your care partner not be given the details of your procedure findings, then the procedure report has been included in a sealed envelope for you to review at your convenience later.  YOU SHOULD EXPECT: Some feelings of bloating in the abdomen. Passage of more gas than usual.  Walking can help get rid of the air that was put into your GI tract during the procedure and reduce the bloating. If you had a lower endoscopy (such as a colonoscopy or flexible sigmoidoscopy) you may notice spotting of blood in your stool or on the toilet paper. If you underwent a bowel prep for your procedure, then you may not have a normal bowel movement for a few days.  DIET: Your first meal following the procedure should be a light meal and then it is ok to progress to your normal diet.  A half-sandwich or bowl of soup is an example of a good first meal.  Heavy or fried foods are harder to digest and may make you feel nauseous or bloated.  Likewise meals heavy in dairy and vegetables can cause extra gas to form and this can also increase the bloating.  Drink plenty of fluids but you should avoid alcoholic beverages for 24 hours.  ACTIVITY: Your care partner should take you home directly after the procedure.  You should plan to take it easy, moving slowly for the rest of the day.  You can resume normal activity the day after the procedure  however you should NOT DRIVE or use heavy machinery for 24 hours (because of the sedation medicines used during the test).    SYMPTOMS TO REPORT IMMEDIATELY: A gastroenterologist can be reached at any hour.  During normal business hours, 8:30 AM to 5:00 PM Monday through Friday, call [phone].  After hours and on weekends, please call the GI answering service at [phone] who will take a message and have the physician on call contact you.   Following lower endoscopy (colonoscopy or flexible sigmoidoscopy):  Excessive amounts of blood in the stool  Significant tenderness or worsening of abdominal pains  Swelling of the abdomen that is new, acute  Fever of 100F or higher  Following upper endoscopy (EGD)  Vomiting of blood or coffee ground material  New chest pain or pain under the shoulder blades  Painful or persistently difficult swallowing  New shortness of breath  Fever of 100F or higher  Black, tarry-looking stools  FOLLOW UP: If any biopsies were taken you will be contacted by phone or by letter within the next 1-3 weeks.  Call your gastroenterologist if you have not heard about the biopsies in 3 weeks.  Our staff will call the home number listed on your records the next business day following your procedure to check on you and address any questions or concerns that you may have at that time regarding the information given to you following your procedure.  This is a courtesy call and so if there is no answer at the home number and we have not heard from you through the emergency physician on call, we will assume that you have returned to your regular daily activities without incident.  SIGNATURES/CONFIDENTIALITY: You and/or your care partner have signed paperwork which will be entered into your electronic medical record.  These signatures attest to the fact that that the information above on your After Visit Summary has been reviewed and is understood.  Full responsibility of  the confidentiality of this discharge information lies with you and/or your care-partner.

## 2013-11-17 NOTE — Op Note (Signed)
[facility]  [address] [place], [id number]   COLONOSCOPY PROCEDURE REPORT  PATIENT: [name], [name]  MR#: [id number] BIRTHDATE: 02/25/1950 , 77  yrs. old GENDER: female ENDOSCOPIST: Gatha Mayer, MD, [facility] PROCEDURE DATE:  11/17/2013 PROCEDURE:   Colonoscopy, surveillance First Screening Colonoscopy - Avg.  risk and is 50 yrs.  old or older - No.  Prior Negative Screening - Now for repeat screening. N/A  History of Adenoma - Now for follow-up colonoscopy & has been > or = to 3 yrs.  N/A History of Adenoma - Now for follow-up colonoscopy & has been > or = to 3 yrs.  Polyps Removed Today? No. Polyps Removed Today? No.  Recommend repeat exam, <10 yrs? Polyps Removed Today? No.  Recommend repeat exam, <10 yrs? Yes.  Polyps Removed Today? No.  Recommend repeat exam, <10 yrs? Yes.  High risk (family or personal hx). ASA CLASS:   Class III INDICATIONS:high risk personal history of colon cancer. MEDICATIONS: Propofol 150 mg IV and Monitored anesthesia care  DESCRIPTION OF PROCEDURE:   After the risks benefits and alternatives of the procedure were thoroughly explained, informed consent was obtained.  The digital rectal exam was not performed. The LB PFC-H190 [id number]  endoscope was introduced through the transverse colostomy and advanced to the cecum, which was identified by both the appendix and ileocecal valve. No adverse events experienced.   The quality of the prep was good, using MiraLax  The instrument was then slowly withdrawn as the colon was fully examined.      COLON FINDINGS: The colonic mucosa appeared normal in the ascending colon, transverse colon, at the appendiceal orifice, and cecum. Retroflexion was not performed. The time to cecum=not measured     .  Withdrawal time=not measured     .  The scope was withdrawn and the procedure completed. COMPLICATIONS: There were no immediate complications.  ENDOSCOPIC IMPRESSION: The colonic mucosa  appeared normal in the ascending colon, transverse colon, at the appendiceal orifice, and cecum Note exam to splenic flexure earlier this year so did not need Hartmann's puch examined.  RECOMMENDATIONS: Repeat Colonoscopy in 3 years. Proceed to colostomy reversal  eSigned:  Gatha Mayer, MD, [facility] 11/17/2013 4:11 PM   cc: Verita Lamb, MD and The Patient

## 2013-11-18 ENCOUNTER — Telehealth: Payer: Self-pay | Admitting: *Deleted

## 2013-11-18 NOTE — Telephone Encounter (Signed)
  Follow up Call-  Call back number 11/17/2013  Post procedure Call Back phone  # [phone] or [phone]  Permission to leave phone message Yes     Patient questions:  Do you have a fever, pain , or abdominal swelling? No. Pain Score  0 *  Have you tolerated food without any problems? Yes.    Have you been able to return to your normal activities? Yes.    Do you have any questions about your discharge instructions: Diet   No. Medications  No. Follow up visit  No.  Do you have questions or concerns about your Care? No.  Actions: * If pain score is 4 or above: No action needed, pain <4.

## 2013-11-24 MED ORDER — DEXTROSE 5 % IV SOLN
2.0000 g | INTRAVENOUS | Status: AC
  Administered 2013-11-25: 2 g via INTRAVENOUS
  Filled 2013-11-24: qty 2

## 2013-11-25 ENCOUNTER — Encounter (HOSPITAL_COMMUNITY): Payer: Medicare Other | Admitting: Certified Registered Nurse Anesthetist

## 2013-11-25 ENCOUNTER — Inpatient Hospital Stay (HOSPITAL_COMMUNITY)
Admission: RE | Admit: 2013-11-25 | Discharge: 2013-12-06 | DRG: 336 | Disposition: A | Discharge status: Home Health | Payer: Medicare Other | Source: Ambulatory Visit | Attending: Surgery | Admitting: Surgery

## 2013-11-25 ENCOUNTER — Inpatient Hospital Stay (HOSPITAL_COMMUNITY): Payer: Medicare Other | Admitting: Certified Registered Nurse Anesthetist

## 2013-11-25 ENCOUNTER — Encounter (HOSPITAL_COMMUNITY)
Admission: RE | Disposition: A | Discharge status: Home Health | Payer: Self-pay | Source: Ambulatory Visit | Attending: Surgery

## 2013-11-25 DIAGNOSIS — M1990 Unspecified osteoarthritis, unspecified site: Secondary | ICD-10-CM | POA: Diagnosis not present

## 2013-11-25 DIAGNOSIS — K660 Peritoneal adhesions (postprocedural) (postinfection): Secondary | ICD-10-CM | POA: Diagnosis not present

## 2013-11-25 DIAGNOSIS — Z9049 Acquired absence of other specified parts of digestive tract: Secondary | ICD-10-CM | POA: Diagnosis present

## 2013-11-25 DIAGNOSIS — Z9889 Other specified postprocedural states: Secondary | ICD-10-CM

## 2013-11-25 DIAGNOSIS — Z7982 Long term (current) use of aspirin: Secondary | ICD-10-CM | POA: Diagnosis not present

## 2013-11-25 DIAGNOSIS — Y838 Other surgical procedures as the cause of abnormal reaction of the patient, or of later complication, without mention of misadventure at the time of the procedure: Secondary | ICD-10-CM | POA: Diagnosis not present

## 2013-11-25 DIAGNOSIS — Z7951 Long term (current) use of inhaled steroids: Secondary | ICD-10-CM | POA: Diagnosis not present

## 2013-11-25 DIAGNOSIS — K567 Ileus, unspecified: Secondary | ICD-10-CM | POA: Diagnosis not present

## 2013-11-25 DIAGNOSIS — M793 Panniculitis, unspecified: Secondary | ICD-10-CM | POA: Diagnosis not present

## 2013-11-25 DIAGNOSIS — Z6841 Body Mass Index (BMI) 40.0 and over, adult: Secondary | ICD-10-CM | POA: Diagnosis not present

## 2013-11-25 DIAGNOSIS — Z9221 Personal history of antineoplastic chemotherapy: Secondary | ICD-10-CM | POA: Diagnosis not present

## 2013-11-25 DIAGNOSIS — C184 Malignant neoplasm of transverse colon: Secondary | ICD-10-CM | POA: Diagnosis not present

## 2013-11-25 DIAGNOSIS — Z79899 Other long term (current) drug therapy: Secondary | ICD-10-CM | POA: Diagnosis not present

## 2013-11-25 DIAGNOSIS — Z433 Encounter for attention to colostomy: Secondary | ICD-10-CM | POA: Diagnosis present

## 2013-11-25 DIAGNOSIS — J45909 Unspecified asthma, uncomplicated: Secondary | ICD-10-CM | POA: Diagnosis present

## 2013-11-25 DIAGNOSIS — L7622 Postprocedural hemorrhage and hematoma of skin and subcutaneous tissue following other procedure: Secondary | ICD-10-CM | POA: Diagnosis not present

## 2013-11-25 DIAGNOSIS — Z85038 Personal history of other malignant neoplasm of large intestine: Secondary | ICD-10-CM

## 2013-11-25 DIAGNOSIS — I10 Essential (primary) hypertension: Secondary | ICD-10-CM | POA: Diagnosis present

## 2013-11-25 DIAGNOSIS — K913 Postprocedural intestinal obstruction: Secondary | ICD-10-CM | POA: Diagnosis not present

## 2013-11-25 DIAGNOSIS — G4733 Obstructive sleep apnea (adult) (pediatric): Secondary | ICD-10-CM | POA: Diagnosis present

## 2013-11-25 DIAGNOSIS — K219 Gastro-esophageal reflux disease without esophagitis: Secondary | ICD-10-CM | POA: Diagnosis present

## 2013-11-25 HISTORY — PX: LAPAROSCOPIC LYSIS OF ADHESIONS: SHX5905

## 2013-11-25 HISTORY — PX: COLOSTOMY REVERSAL: SHX5782

## 2013-11-25 LAB — CBC
HCT: 41.4 % (ref 36.0–46.0)
Hemoglobin: 13.7 g/dL (ref 12.0–15.0)
MCH: 26.2 pg (ref 26.0–34.0)
MCHC: 33.1 g/dL (ref 30.0–36.0)
MCV: 79.3 fL (ref 78.0–100.0)
PLATELETS: 188 10*3/uL (ref 150–400)
RBC: 5.22 MIL/uL — AB (ref 3.87–5.11)
RDW: 14.5 % (ref 11.5–15.5)
WBC: 15.5 10*3/uL — ABNORMAL HIGH (ref 4.0–10.5)

## 2013-11-25 LAB — CREATININE, SERUM
CREATININE: 0.72 mg/dL (ref 0.50–1.10)
GFR calc Af Amer: >90 mL/min (ref >90)
GFR calc non Af Amer: 89 mL/min — ABNORMAL LOW (ref >90)

## 2013-11-25 SURGERY — COLOSTOMY REVERSAL
Anesthesia: General | Site: Abdomen

## 2013-11-25 MED ORDER — ALBUTEROL SULFATE (2.5 MG/3ML) 0.083% IN NEBU: 2.5000 mg | INHALATION_SOLUTION | Freq: Four times a day (QID) | RESPIRATORY_TRACT | Status: DC | PRN

## 2013-11-25 MED ORDER — SCOPOLAMINE 1 MG/3DAYS TD PT72
1.0000 | MEDICATED_PATCH | TRANSDERMAL | Status: DC
  Administered 2013-11-25: 1.5 mg via TRANSDERMAL
  Filled 2013-11-25: qty 1

## 2013-11-25 MED ORDER — GLYCOPYRROLATE 0.2 MG/ML IJ SOLN
INTRAMUSCULAR | Status: AC
  Filled 2013-11-25: qty 4

## 2013-11-25 MED ORDER — NALOXONE HCL 0.4 MG/ML IJ SOLN
0.4000 mg | INTRAMUSCULAR | Status: DC | PRN
Start: 2013-11-25 — End: 2013-11-28

## 2013-11-25 MED ORDER — STERILE WATER FOR INJECTION IJ SOLN
INTRAMUSCULAR | Status: AC
  Filled 2013-11-25: qty 10

## 2013-11-25 MED ORDER — HYDROMORPHONE HCL 1 MG/ML IJ SOLN
0.2500 mg | INTRAMUSCULAR | Status: DC | PRN
  Administered 2013-11-25 (×2): 0.5 mg via INTRAVENOUS

## 2013-11-25 MED ORDER — LACTATED RINGERS IV SOLN
INTRAVENOUS | Status: DC | PRN
  Administered 2013-11-25 (×2): via INTRAVENOUS

## 2013-11-25 MED ORDER — LORATADINE 10 MG PO TABS
10.0000 mg | ORAL_TABLET | Freq: Every day | ORAL | Status: DC
  Administered 2013-11-26 – 2013-12-06 (×11): 10 mg via ORAL
  Filled 2013-11-25 (×12): qty 1

## 2013-11-25 MED ORDER — ONDANSETRON HCL 4 MG PO TABS: 4.0000 mg | ORAL_TABLET | Freq: Four times a day (QID) | ORAL | Status: DC | PRN

## 2013-11-25 MED ORDER — DEXAMETHASONE SODIUM PHOSPHATE 4 MG/ML IJ SOLN
INTRAMUSCULAR | Status: DC | PRN
  Administered 2013-11-25: 4 mg via INTRAVENOUS

## 2013-11-25 MED ORDER — PROMETHAZINE HCL 25 MG/ML IJ SOLN
6.2500 mg | INTRAMUSCULAR | Status: DC | PRN
  Administered 2013-11-25: 12.5 mg via INTRAVENOUS

## 2013-11-25 MED ORDER — DIPHENHYDRAMINE HCL 50 MG/ML IJ SOLN: 12.5000 mg | Freq: Four times a day (QID) | INTRAMUSCULAR | Status: DC | PRN

## 2013-11-25 MED ORDER — MONTELUKAST SODIUM 10 MG PO TABS
10.0000 mg | ORAL_TABLET | Freq: Every day | ORAL | Status: DC
  Administered 2013-11-25 – 2013-12-05 (×11): 10 mg via ORAL
  Filled 2013-11-25 (×19): qty 1

## 2013-11-25 MED ORDER — OXYCODONE HCL 5 MG PO TABS: 5.0000 mg | ORAL_TABLET | Freq: Once | ORAL | Status: DC | PRN

## 2013-11-25 MED ORDER — MIDAZOLAM HCL 5 MG/5ML IJ SOLN
INTRAMUSCULAR | Status: DC | PRN
  Administered 2013-11-25: 2 mg via INTRAVENOUS

## 2013-11-25 MED ORDER — FENTANYL CITRATE 0.05 MG/ML IJ SOLN
INTRAMUSCULAR | Status: DC | PRN
  Administered 2013-11-25: 25 ug via INTRAVENOUS
  Administered 2013-11-25: 50 ug via INTRAVENOUS
  Administered 2013-11-25: 100 ug via INTRAVENOUS
  Administered 2013-11-25 (×4): 50 ug via INTRAVENOUS

## 2013-11-25 MED ORDER — ENOXAPARIN SODIUM 40 MG/0.4ML ~~LOC~~ SOLN
40.0000 mg | SUBCUTANEOUS | Status: DC
  Administered 2013-11-26 – 2013-12-06 (×10): 40 mg via SUBCUTANEOUS
  Filled 2013-11-25 (×13): qty 0.4

## 2013-11-25 MED ORDER — PROPOFOL 10 MG/ML IV BOLUS
INTRAVENOUS | Status: AC
  Filled 2013-11-25: qty 20

## 2013-11-25 MED ORDER — VECURONIUM BROMIDE 10 MG IV SOLR
INTRAVENOUS | Status: DC | PRN
  Administered 2013-11-25 (×4): 2 mg via INTRAVENOUS

## 2013-11-25 MED ORDER — MOMETASONE FURO-FORMOTEROL FUM 100-5 MCG/ACT IN AERO
2.0000 | INHALATION_SPRAY | Freq: Two times a day (BID) | RESPIRATORY_TRACT | Status: DC
  Administered 2013-11-25 – 2013-12-06 (×21): 2 via RESPIRATORY_TRACT
  Filled 2013-11-25 (×2): qty 8.8

## 2013-11-25 MED ORDER — POTASSIUM CHLORIDE IN NACL 20-0.9 MEQ/L-% IV SOLN
INTRAVENOUS | Status: DC
  Administered 2013-11-25 – 2013-11-29 (×6): via INTRAVENOUS
  Filled 2013-11-25 (×10): qty 1000

## 2013-11-25 MED ORDER — HYDROMORPHONE HCL 1 MG/ML IJ SOLN
INTRAMUSCULAR | Status: AC
  Administered 2013-11-25: 0.5 mg via INTRAVENOUS
  Filled 2013-11-25: qty 1

## 2013-11-25 MED ORDER — NEOSTIGMINE METHYLSULFATE 10 MG/10ML IV SOLN
INTRAVENOUS | Status: DC | PRN
  Administered 2013-11-25: 5 mg via INTRAVENOUS

## 2013-11-25 MED ORDER — MIDAZOLAM HCL 2 MG/2ML IJ SOLN
INTRAMUSCULAR | Status: AC
  Filled 2013-11-25: qty 2

## 2013-11-25 MED ORDER — GLYCOPYRROLATE 0.2 MG/ML IJ SOLN
INTRAMUSCULAR | Status: DC | PRN
  Administered 2013-11-25: .8 mg via INTRAVENOUS

## 2013-11-25 MED ORDER — ROCURONIUM BROMIDE 50 MG/5ML IV SOLN
INTRAVENOUS | Status: AC
  Filled 2013-11-25: qty 1

## 2013-11-25 MED ORDER — ROCURONIUM BROMIDE 100 MG/10ML IV SOLN
INTRAVENOUS | Status: DC | PRN
Start: 2013-11-25 — End: 2013-11-25
  Administered 2013-11-25: 10 mg via INTRAVENOUS
  Administered 2013-11-25: 40 mg via INTRAVENOUS

## 2013-11-25 MED ORDER — SUCCINYLCHOLINE CHLORIDE 20 MG/ML IJ SOLN
INTRAMUSCULAR | Status: AC
  Filled 2013-11-25: qty 1

## 2013-11-25 MED ORDER — DIPHENHYDRAMINE HCL 12.5 MG/5ML PO ELIX
12.5000 mg | ORAL_SOLUTION | Freq: Four times a day (QID) | ORAL | Status: DC | PRN
Start: 2013-11-25 — End: 2013-11-28

## 2013-11-25 MED ORDER — ONDANSETRON HCL 4 MG/2ML IJ SOLN
INTRAMUSCULAR | Status: DC | PRN
  Administered 2013-11-25: 4 mg via INTRAVENOUS

## 2013-11-25 MED ORDER — SODIUM CHLORIDE 0.9 % IJ SOLN: 9.0000 mL | INTRAMUSCULAR | Status: DC | PRN

## 2013-11-25 MED ORDER — FENTANYL CITRATE 0.05 MG/ML IJ SOLN
INTRAMUSCULAR | Status: AC
  Filled 2013-11-25: qty 5

## 2013-11-25 MED ORDER — ONDANSETRON HCL 4 MG/2ML IJ SOLN: 4.0000 mg | Freq: Four times a day (QID) | INTRAMUSCULAR | Status: DC | PRN

## 2013-11-25 MED ORDER — PROPOFOL 10 MG/ML IV BOLUS
INTRAVENOUS | Status: DC | PRN
  Administered 2013-11-25: 120 mg via INTRAVENOUS

## 2013-11-25 MED ORDER — VECURONIUM BROMIDE 10 MG IV SOLR
INTRAVENOUS | Status: AC
  Filled 2013-11-25: qty 10

## 2013-11-25 MED ORDER — 0.9 % SODIUM CHLORIDE (POUR BTL) OPTIME
TOPICAL | Status: DC | PRN
  Administered 2013-11-25 (×2): 2000 mL

## 2013-11-25 MED ORDER — FLUTICASONE PROPIONATE 50 MCG/ACT NA SUSP
1.0000 | Freq: Every day | NASAL | Status: DC
  Administered 2013-11-27 – 2013-12-06 (×10): 1 via NASAL
  Filled 2013-11-25: qty 16

## 2013-11-25 MED ORDER — ONDANSETRON HCL 4 MG/2ML IJ SOLN
INTRAMUSCULAR | Status: AC
  Filled 2013-11-25: qty 2

## 2013-11-25 MED ORDER — MORPHINE SULFATE (PF) 1 MG/ML IV SOLN
INTRAVENOUS | Status: DC
  Administered 2013-11-25: 17:00:00 via INTRAVENOUS
  Administered 2013-11-26 (×2): 6 mg via INTRAVENOUS
  Administered 2013-11-26: 3 mg via INTRAVENOUS
  Administered 2013-11-26: 4.5 mg via INTRAVENOUS
  Administered 2013-11-26: 3 mg via INTRAVENOUS
  Administered 2013-11-26: 16:00:00 via INTRAVENOUS
  Administered 2013-11-27: 6 mg via INTRAVENOUS
  Administered 2013-11-27: 3 mg via INTRAVENOUS
  Administered 2013-11-27 (×2): 4.5 mg via INTRAVENOUS
  Administered 2013-11-27: 20:00:00 via INTRAVENOUS
  Administered 2013-11-28: 4.5 mg via INTRAVENOUS
  Administered 2013-11-28: 1.5 mg via INTRAVENOUS
  Administered 2013-11-28: 3 mg via INTRAVENOUS
  Administered 2013-11-28: 7.25 mg via INTRAVENOUS
  Filled 2013-11-25 (×3): qty 25

## 2013-11-25 MED ORDER — DEXTROSE 5 % IV SOLN
1.0000 g | Freq: Four times a day (QID) | INTRAVENOUS | Status: AC
  Administered 2013-11-25 – 2013-11-26 (×3): 1 g via INTRAVENOUS
  Filled 2013-11-25 (×3): qty 1

## 2013-11-25 MED ORDER — LIDOCAINE HCL (CARDIAC) 20 MG/ML IV SOLN
INTRAVENOUS | Status: AC
  Filled 2013-11-25: qty 5

## 2013-11-25 MED ORDER — NEOSTIGMINE METHYLSULFATE 10 MG/10ML IV SOLN
INTRAVENOUS | Status: AC
  Filled 2013-11-25: qty 1

## 2013-11-25 MED ORDER — PHENYLEPHRINE 40 MCG/ML (10ML) SYRINGE FOR IV PUSH (FOR BLOOD PRESSURE SUPPORT)
PREFILLED_SYRINGE | INTRAVENOUS | Status: AC
  Filled 2013-11-25: qty 10

## 2013-11-25 MED ORDER — PROMETHAZINE HCL 25 MG/ML IJ SOLN
INTRAMUSCULAR | Status: AC
  Administered 2013-11-25: 12.5 mg via INTRAVENOUS
  Filled 2013-11-25: qty 1

## 2013-11-25 MED ORDER — OXYCODONE HCL 5 MG/5ML PO SOLN: 5.0000 mg | Freq: Once | ORAL | Status: DC | PRN

## 2013-11-25 MED ORDER — DEXAMETHASONE SODIUM PHOSPHATE 4 MG/ML IJ SOLN
INTRAMUSCULAR | Status: AC
  Filled 2013-11-25: qty 1

## 2013-11-25 MED ORDER — ONDANSETRON HCL 4 MG/2ML IJ SOLN
4.0000 mg | Freq: Four times a day (QID) | INTRAMUSCULAR | Status: DC | PRN
  Administered 2013-11-26 – 2013-11-30 (×3): 4 mg via INTRAVENOUS
  Filled 2013-11-25 (×3): qty 2

## 2013-11-25 MED ORDER — LIDOCAINE HCL (CARDIAC) 20 MG/ML IV SOLN
INTRAVENOUS | Status: DC | PRN
  Administered 2013-11-25: 20 mg via INTRAVENOUS
  Administered 2013-11-25: 60 mg via INTRAVENOUS

## 2013-11-25 SURGICAL SUPPLY — 57 items
BLADE SURG ROTATE 9660 (MISCELLANEOUS) IMPLANT
BNDG GAUZE ELAST 4 BULKY (GAUZE/BANDAGES/DRESSINGS) ×3 IMPLANT
CANISTER SUCTION 2500CC (MISCELLANEOUS) ×3 IMPLANT
CHLORAPREP W/TINT 26ML (MISCELLANEOUS) ×3 IMPLANT
COVER MAYO STAND STRL (DRAPES) ×6 IMPLANT
DRAPE LAPAROSCOPIC ABDOMINAL (DRAPES) ×3 IMPLANT
DRAPE PROXIMA HALF (DRAPES) ×6 IMPLANT
DRAPE UTILITY 15X26 W/TAPE STR (DRAPE) ×15 IMPLANT
DRAPE WARM FLUID 44X44 (DRAPE) ×3 IMPLANT
DRSG OPSITE POSTOP 4X10 (GAUZE/BANDAGES/DRESSINGS) ×3 IMPLANT
DRSG OPSITE POSTOP 4X8 (GAUZE/BANDAGES/DRESSINGS) IMPLANT
ELECT BLADE 6.5 EXT (BLADE) ×3 IMPLANT
ELECT CAUTERY BLADE 6.4 (BLADE) ×6 IMPLANT
ELECT REM PT RETURN 9FT ADLT (ELECTROSURGICAL) ×3
ELECTRODE REM PT RTRN 9FT ADLT (ELECTROSURGICAL) ×1 IMPLANT
GEL ULTRASOUND 20GR AQUASONIC (MISCELLANEOUS) IMPLANT
GLOVE BIO SURGEON STRL SZ7 (GLOVE) ×15 IMPLANT
GLOVE BIO SURGEON STRL SZ7.5 (GLOVE) ×3 IMPLANT
GLOVE BIOGEL PI IND STRL 7.0 (GLOVE) ×3 IMPLANT
GLOVE BIOGEL PI IND STRL 7.5 (GLOVE) ×5 IMPLANT
GLOVE BIOGEL PI INDICATOR 7.0 (GLOVE) ×6
GLOVE BIOGEL PI INDICATOR 7.5 (GLOVE) ×10
GLOVE SURG SS PI 7.0 STRL IVOR (GLOVE) ×6 IMPLANT
GOWN STRL REUS W/ TWL LRG LVL3 (GOWN DISPOSABLE) ×7 IMPLANT
GOWN STRL REUS W/TWL LRG LVL3 (GOWN DISPOSABLE) ×14
KIT BASIN OR (CUSTOM PROCEDURE TRAY) ×3 IMPLANT
KIT ROOM TURNOVER OR (KITS) ×3 IMPLANT
LIGASURE IMPACT 36 18CM CVD LR (INSTRUMENTS) IMPLANT
NS IRRIG 1000ML POUR BTL (IV SOLUTION) ×6 IMPLANT
PACK GENERAL/GYN (CUSTOM PROCEDURE TRAY) ×3 IMPLANT
PAD ARMBOARD 7.5X6 YLW CONV (MISCELLANEOUS) ×3 IMPLANT
PENCIL BUTTON HOLSTER BLD 10FT (ELECTRODE) ×3 IMPLANT
RELOAD PROXIMATE 75MM BLUE (ENDOMECHANICALS) ×3 IMPLANT
SPONGE GAUZE 4X4 12PLY STER LF (GAUZE/BANDAGES/DRESSINGS) ×3 IMPLANT
SPONGE LAP 18X18 X RAY DECT (DISPOSABLE) ×6 IMPLANT
STAPLER GUN LINEAR PROX 60 (STAPLE) ×3 IMPLANT
STAPLER PROXIMATE 75MM BLUE (STAPLE) ×3 IMPLANT
STAPLER VISISTAT 35W (STAPLE) ×3 IMPLANT
SUCTION POOLE TIP (SUCTIONS) ×3 IMPLANT
SURGILUBE 2OZ TUBE FLIPTOP (MISCELLANEOUS) IMPLANT
SUT PDS AB 1 TP1 96 (SUTURE) ×6 IMPLANT
SUT PROLENE 2 0 CT2 30 (SUTURE) IMPLANT
SUT PROLENE 2 0 KS (SUTURE) IMPLANT
SUT SILK 2 0 SH CR/8 (SUTURE) ×3 IMPLANT
SUT SILK 2 0 TIES 10X30 (SUTURE) ×3 IMPLANT
SUT SILK 3 0 SH CR/8 (SUTURE) ×3 IMPLANT
SUT SILK 3 0 TIES 10X30 (SUTURE) ×3 IMPLANT
TOWEL OR 17X24 6PK STRL BLUE (TOWEL DISPOSABLE) ×3 IMPLANT
TOWEL OR 17X26 10 PK STRL BLUE (TOWEL DISPOSABLE) ×3 IMPLANT
TRAY FOLEY CATH 16FRSI W/METER (SET/KITS/TRAYS/PACK) ×3 IMPLANT
TRAY PROCTOSCOPIC FIBER OPTIC (SET/KITS/TRAYS/PACK) IMPLANT
TUBE CONNECTING 12'X1/4 (SUCTIONS) ×1
TUBE CONNECTING 12X1/4 (SUCTIONS) ×2 IMPLANT
TUBE CONNECTING 20'X1/4 (TUBING) ×1
TUBE CONNECTING 20X1/4 (TUBING) ×2 IMPLANT
WATER STERILE IRR 1000ML POUR (IV SOLUTION) IMPLANT
YANKAUER SUCT BULB TIP NO VENT (SUCTIONS) ×3 IMPLANT

## 2013-11-25 NOTE — Anesthesia Postprocedure Evaluation (Signed)
  Anesthesia Post-op Note  Patient: [name]  Procedure(s) Performed: Procedure(s): COLOSTOMY REVERSAL (N/A) LAPAROSCOPIC LYSIS OF ADHESIONS 49min (N/A)  Patient Location: PACU  Anesthesia Type:General  Level of Consciousness: awake, alert  and oriented  Airway and Oxygen Therapy: Patient Spontanous Breathing  Post-op Pain: none  Post-op Assessment: Post-op Vital signs reviewed  Post-op Vital Signs: Reviewed  Last Vitals:  Filed Vitals:   11/25/13 1240  BP: 134/78  Pulse: 74  Temp: 36.3 C  Resp: 16    Complications: No apparent anesthesia complications

## 2013-11-25 NOTE — Progress Notes (Signed)
Spoke with dr r fitzgerald re vs , specifically hr 40's -50's and maintaining bp with no issues rr12- 16 asleep, roouses to name call/follows commands / ok to transport per him

## 2013-11-25 NOTE — Op Note (Signed)
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 Asst: Dr. Autumn Messing Anesthesia Gen. endotracheal Indications: This is a 63 year old female who presented in January 2015 with a nearly obstructing colon cancer at her splenic flexure. We were unable to prep her prior surgery. She had resection of the distal transverse colon and most of the descending colon. She was given a transverse colostomy in the right upper quadrant. She has a Hartman's pouch reaching to her distal descending colon.  She received chemotherapy and has done quite well. She presents now for colostomy reversal.  Description of procedure:  The patient brought to the operating room and placed in supine position on the operating room table. After an adequate level of general anesthesia was obtained, Foley catheter was placed in a sterile technique. The patient's legs were placed in lithotomy position in yellowfin stirrups. Her abdomen was prepped with ChloraPrep and her perineum was prepped with Betadine. We draped sterile fashion. A timeout was taken to ensure the proper patient and proper procedure. Her colostomy had been closed with a purse string suture of 2-0 silk. We open her previous midline incision. Dissection was carried down through a considerable amount of adipose tissue to the fascia. There is very thick scar at the fascia. We're able to bluntly dissected into the peritoneal cavity. There are a lot of adhesions which abdominal wall. We able to slowly dissect these away bluntly. We continued opening the fascia through the length of the incision. Once we had open the entire fascial incision we spent the next 90 minutes taking down adhesions and mobilizing small bowel the pelvis. We were able to pack the small bowel  into the upper part of the abdomen. The distal stump was identified by the blue Prolene sutures. We mobilized the distal. Away from the pelvis. This is relatively long and extends to the distal descending colon. Once we completed mobilization of the distal stump we took down the colostomy by excising an ellipse of skin around the colostomy. The colostomy was mobilized from the surrounding tissues and away from the fascia. We able to reduce this into the abdomen. This is in the proximal mid transverse colon. We then mobilized the entire right colon by taking down the hepatic flexure. This allowed full mobilization of the right colon down into the pelvis.  Initially, we consider a EEA anastomosis. I went below and inserted the rigid sigmoidoscope.  There is a considerable amount of old mucus in the still stop. We evacuated some of this. However it seemed that there was a very long length of distal colon. We decided to do a side-to-side anastomosis within the abdomen. I changed gown and gloves and we mobilized the distal stump as well as the proximal colon.  We amputated about 4 cm of the colostomy with GIA 75 stapler. We created a side-to-side anastomosis with a GIA-75 stapler. The lumen of the anastomosis was inspected and seemed to be hemostatic. We closed the common enterotomy with a TA 60 stapler. A crotch suture of 3-0 silk had been placed. We then tested the anastomosis by inserting the rigid sigmoidoscope and insufflating air. The pelvis had been filled with saline. With full distention of the distal colon but no air bubbles were noted. We released the air within the rectum. We irrigated the abdomen thoroughly after following the: Protocol. The fascia of the colostomy site was  closed with a running #1 PDS suture. We again inspected for hemostasis and showed that her sponge count was correct. We then closed the fascia with double-stranded #1 PDS suture. Subcutaneous tissues were irrigated and staples were used  to close the skin. The colostomy site was packed with saline moistened gauze. Dry dressings were applied. The honeycomb dressing was used for the midline incision. The patient was then extubated and brought to the recovery room in stable condition. All sponge, instrument, and needle counts are correct.   Imogene Burn. Georgette Dover, MD, [facility] Surgery  General/ Trauma Surgery  11/25/2013 11:09 AM

## 2013-11-25 NOTE — Transfer of Care (Signed)
Immediate Anesthesia Transfer of Care Note  Patient: [name]  Procedure(s) Performed: Procedure(s): COLOSTOMY REVERSAL (N/A) LAPAROSCOPIC LYSIS OF ADHESIONS 39min (N/A)  Patient Location: PACU  Anesthesia Type:General  Level of Consciousness: awake, alert , oriented and patient cooperative  Airway & Oxygen Therapy: Patient Spontanous Breathing and Patient connected to nasal cannula oxygen  Post-op Assessment: Report given to PACU RN, Post -op Vital signs reviewed and stable and Patient moving all extremities  Post vital signs: Reviewed and stable  Complications: No apparent anesthesia complications
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
## 2013-11-25 NOTE — Anesthesia Preprocedure Evaluation (Addendum)
Anesthesia Evaluation  Patient identified by MRN, date of birth, ID band Patient awake    Reviewed: Allergy & Precautions, H&P , NPO status , Patient's Chart, lab work & pertinent test results  Airway Mallampati: I  TM Distance: >3 FB Neck ROM: Full    Dental  (+) Teeth Intact, Dental Advisory Given   Pulmonary asthma , sleep apnea , COPD breath sounds clear to auscultation        Cardiovascular hypertension, Pt. on medications Rhythm:Regular Rate:Normal     Neuro/Psych negative neurological ROS  negative psych ROS   GI/Hepatic Neg liver ROS, GERD-  ,  Endo/Other  Morbid obesity  Renal/GU negative Renal ROS     Musculoskeletal  (+) Arthritis -,   Abdominal   Peds  Hematology negative hematology ROS (+)   Anesthesia Other Findings   Reproductive/Obstetrics                            Anesthesia Physical Anesthesia Plan  ASA: III  Anesthesia Plan: General   Post-op Pain Management:    Induction: Intravenous  Airway Management Planned: Oral ETT  Additional Equipment:   Intra-op Plan:   Post-operative Plan: Extubation in OR  Informed Consent: I have reviewed the patients History and Physical, chart, labs and discussed the procedure including the risks, benefits and alternatives for the proposed anesthesia with the patient or authorized representative who has indicated his/her understanding and acceptance.   Dental advisory given  Plan Discussed with: CRNA and Surgeon  Anesthesia Plan Comments:         Anesthesia Quick Evaluation

## 2013-11-25 NOTE — Anesthesia Procedure Notes (Signed)
Procedure Name: Intubation Date/Time: 11/25/2013 7:27 AM Performed by: Shirlyn Goltz Pre-anesthesia Checklist: Patient identified, Emergency Drugs available, Suction available and Patient being monitored Patient Re-evaluated:Patient Re-evaluated prior to inductionOxygen Delivery Method: Circle system utilized Preoxygenation: Pre-oxygenation with 100% oxygen Intubation Type: IV induction Ventilation: Oral airway inserted - appropriate to patient size and Two handed mask ventilation required Laryngoscope Size: Mac and 4 Grade View: Grade I Tube type: Oral Tube size: 7.0 mm Number of attempts: 1 (placed by traci little paramedic) Airway Equipment and Method: Stylet Placement Confirmation: ETT inserted through vocal cords under direct vision,  positive ETCO2 and breath sounds checked- equal and bilateral Secured at: 22 cm Tube secured with: Tape Dental Injury: Teeth and Oropharynx as per pre-operative assessment

## 2013-11-26 ENCOUNTER — Encounter (HOSPITAL_COMMUNITY): Payer: Self-pay | Admitting: General Practice

## 2013-11-26 LAB — CBC
HCT: 37.8 % (ref 36.0–46.0)
Hemoglobin: 12.4 g/dL (ref 12.0–15.0)
MCH: 25.9 pg — ABNORMAL LOW (ref 26.0–34.0)
MCHC: 32.8 g/dL (ref 30.0–36.0)
MCV: 78.9 fL (ref 78.0–100.0)
Platelets: 228 10*3/uL (ref 150–400)
RBC: 4.79 MIL/uL (ref 3.87–5.11)
RDW: 14.5 % (ref 11.5–15.5)
WBC: 11 10*3/uL — ABNORMAL HIGH (ref 4.0–10.5)

## 2013-11-26 LAB — BASIC METABOLIC PANEL
Anion gap: 9 (ref 5–15)
BUN: 7 mg/dL (ref 6–23)
CO2: 27 mEq/L (ref 19–32)
Calcium: 8.7 mg/dL (ref 8.4–10.5)
Chloride: 109 mEq/L (ref 96–112)
Creatinine, Ser: 0.9 mg/dL (ref 0.50–1.10)
GFR calc non Af Amer: 66 mL/min — ABNORMAL LOW (ref >90)
GFR, EST AFRICAN AMERICAN: 77 mL/min — AB (ref >90)
Glucose, Bld: 109 mg/dL — ABNORMAL HIGH (ref 70–99)
Potassium: 4.2 mEq/L (ref 3.7–5.3)
Sodium: 145 mEq/L (ref 137–147)

## 2013-11-26 MED ORDER — WHITE PETROLATUM GEL
Status: AC
  Administered 2013-11-26: 19:00:00
  Filled 2013-11-26: qty 5

## 2013-11-26 NOTE — Progress Notes (Signed)
1 Day Post-Op  Subjective: "I'm sore" - using PCA well Nausea has improved from last night  -tolerating clears   Objective: Vital signs in last 24 hours: Temp:  [96.3 F (35.7 C)-98.3 F (36.8 C)] 98.1 F (36.7 C) (10/30 0519) Pulse Rate:  [46-87] 77 (10/30 0519) Resp:  [15-24] 17 (10/30 0753) BP: (122-141)/(63-78) 127/68 mmHg (10/30 0519) SpO2:  [98 %-100 %] 99 % (10/30 0753)    Intake/Output from previous day: 10/29 0701 - 10/30 0700 In: 2411.3 [I.V.:2411.3] Out: 8413 [Urine:1695; Blood:100] Intake/Output this shift:    General appearance: alert, cooperative and no distress Resp: clear to auscultation bilaterally Cardio: regular rate and rhythm, S1, S2 normal, no murmur, click, rub or gallop GI: hypoactive bowel sounds, mildly distended Honeycomb dressing dry; RUQ ostomy site recently changed - reportedly clean  Lab Results:   Recent Labs  11/25/13 1440 11/26/13 0430  WBC 15.5* 11.0*  HGB 13.7 12.4  HCT 41.4 37.8  PLT 188 228   BMET  Recent Labs  11/25/13 1440 11/26/13 0430  NA  --  145  K  --  4.2  CL  --  109  CO2  --  27  GLUCOSE  --  109*  BUN  --  7  CREATININE 0.72 0.90  CALCIUM  --  8.7   PT/INR No results found for this basename: LABPROT, INR,  in the last 72 hours ABG No results found for this basename: PHART, PCO2, PO2, HCO3,  in the last 72 hours  Studies/Results: No results found.  Anti-infectives: Anti-infectives   Start     Dose/Rate Route Frequency Ordered Stop   11/25/13 1400  cefOXitin (MEFOXIN) 1 g in dextrose 5 % 50 mL IVPB     1 g 100 mL/hr over 30 Minutes Intravenous Every 6 hours 11/25/13 1258 11/26/13 0131   11/25/13 0600  cefOXitin (MEFOXIN) 2 g in dextrose 5 % 50 mL IVPB     2 g 100 mL/hr over 30 Minutes Intravenous On call to O.R. 11/24/13 1427 11/25/13 0745      Assessment/Plan: s/p Procedure(s): COLOSTOMY REVERSAL (N/A) LAPAROSCOPIC LYSIS OF ADHESIONS 35min (N/A) d/c foley Encourage ambulation Await  return of bowel function   LOS: 1 day    Taviana Westergren K. 11/26/2013

## 2013-11-26 NOTE — Progress Notes (Signed)
Orthopedic Tech Progress Note Patient Details:  ANEKA FAGERSTROM Mar 26, 1950 [id number] 2 abdominal binders delivered to nursing station. Patient ID: [name], female   DOB: September 20, 1950, 63 y.o.   MRN: [id number]   Fenton Foy 11/26/2013, 9:26 AM

## 2013-11-27 LAB — CBC
HCT: 39.1 % (ref 36.0–46.0)
HEMOGLOBIN: 12.5 g/dL (ref 12.0–15.0)
MCH: 25.8 pg — AB (ref 26.0–34.0)
MCHC: 32 g/dL (ref 30.0–36.0)
MCV: 80.6 fL (ref 78.0–100.0)
PLATELETS: 235 10*3/uL (ref 150–400)
RBC: 4.85 MIL/uL (ref 3.87–5.11)
RDW: 14.6 % (ref 11.5–15.5)
WBC: 10.4 10*3/uL (ref 4.0–10.5)

## 2013-11-27 LAB — BASIC METABOLIC PANEL
Anion gap: 12 (ref 5–15)
BUN: 6 mg/dL (ref 6–23)
CALCIUM: 8.9 mg/dL (ref 8.4–10.5)
CO2: 25 mEq/L (ref 19–32)
Chloride: 109 mEq/L (ref 96–112)
Creatinine, Ser: 0.89 mg/dL (ref 0.50–1.10)
GFR calc non Af Amer: 67 mL/min — ABNORMAL LOW (ref >90)
GFR, EST AFRICAN AMERICAN: 78 mL/min — AB (ref >90)
GLUCOSE: 109 mg/dL — AB (ref 70–99)
Potassium: 3.8 mEq/L (ref 3.7–5.3)
SODIUM: 146 meq/L (ref 137–147)

## 2013-11-27 NOTE — Progress Notes (Signed)
Patient to self-administer CPAP.  Patient is familiar with equipment and procedure as patient wears CPAP at home.  Machine setup and placed within patient's reach.  Patient assured that RT would be available if needed. Autotitrate.

## 2013-11-27 NOTE — Progress Notes (Signed)
Pt expelled a small amount of blood from her rectum when she voided today. Reports flatus. Up in the chair. Up to the bathroom using the walker with assistance. Presenter, broadcasting

## 2013-11-27 NOTE — Progress Notes (Signed)
Patient ID: [name], female   DOB: August 11, 1950, 63 y.o.   MRN: [id number]  General Surgery - [facility] - Progress Note  POD# 2  Subjective: Patient up in chair.  Ambulated yesterday.  Tolerating clear liquid diet.  No flatus or BM.  Objective: Vital signs in last 24 hours: Temp:  [97.8 F (36.6 C)-99.1 F (37.3 C)] 97.9 F (36.6 C) (10/31 0553) Pulse Rate:  [77-107] 89 (10/31 0553) Resp:  [18-34] 20 (10/31 0800) BP: (122-155)/(65-91) 122/68 mmHg (10/31 0553) SpO2:  [94 %-100 %] 97 % (10/31 0819)    Intake/Output from previous day: 10/30 0701 - 10/31 0700 In: 1211 [P.O.:536; I.V.:675] Out: 2 [Urine:2]  Exam: HEENT - clear, not icteric Neck - soft Chest - clear bilaterally Cor - RRR, no murmur Abd - soft, binder on; dressings intact; BS present Ext - no significant edema Neuro - grossly intact, no focal deficits  Lab Results:   Recent Labs  11/26/13 0430 11/27/13 0655  WBC 11.0* 10.4  HGB 12.4 12.5  HCT 37.8 39.1  PLT 228 235     Recent Labs  11/26/13 0430 11/27/13 0655  NA 145 146  K 4.2 3.8  CL 109 109  CO2 27 25  GLUCOSE 109* 109*  BUN 7 6  CREATININE 0.90 0.89  CALCIUM 8.7 8.9    Studies/Results: No results found.  Assessment / Plan: 1.  Status post colostomy closure, LOA  Clear liquid diet  Encouraged OOB, ambulation  Wound care  Earnstine Regal, MD, [facility] Office: [phone]  11/27/2013

## 2013-11-27 NOTE — Progress Notes (Signed)
Patient will not be able to wear CPAP tonight due to PCA pump with endtidal CO2 monitor.  CPAP would impede the monitors ability to read C02.  Patient is aware, will continue CPAP once PCA is discontinued.  RN aware.

## 2013-11-28 MED ORDER — MORPHINE SULFATE 2 MG/ML IJ SOLN: 2.0000 mg | INTRAMUSCULAR | Status: DC | PRN

## 2013-11-28 MED ORDER — OXYCODONE-ACETAMINOPHEN 5-325 MG PO TABS
1.0000 | ORAL_TABLET | ORAL | Status: DC | PRN
  Administered 2013-11-28: 1 via ORAL
  Administered 2013-11-28 – 2013-11-30 (×8): 2 via ORAL
  Administered 2013-12-01: 1 via ORAL
  Administered 2013-12-01: 2 via ORAL
  Administered 2013-12-01: 1 via ORAL
  Administered 2013-12-01 – 2013-12-06 (×14): 2 via ORAL
  Filled 2013-11-28 (×5): qty 2
  Filled 2013-11-28: qty 1
  Filled 2013-11-28 (×10): qty 2
  Filled 2013-11-28: qty 1
  Filled 2013-11-28 (×3): qty 2
  Filled 2013-11-28: qty 1
  Filled 2013-11-28 (×5): qty 2

## 2013-11-28 NOTE — Assessment & Plan Note (Signed)
Good compliance and control. Weight loss would help.  

## 2013-11-28 NOTE — Progress Notes (Signed)
3 Days Post-Op  Subjective: Patient not using PCA much Passed some flatus this morning Ambulating very slowly  Objective: Vital signs in last 24 hours: Temp:  [97.9 F (36.6 C)-98.8 F (37.1 C)] 97.9 F (36.6 C) (11/01 0702) Pulse Rate:  [79-98] 79 (11/01 0702) Resp:  [18-28] 22 (11/01 0702) BP: (109-130)/(49-73) 117/49 mmHg (11/01 0702) SpO2:  [95 %-100 %] 96 % (11/01 0702) FiO2 (%):  [94 %-98 %] 94 % (11/01 0755)    Intake/Output from previous day: 10/31 0701 - 11/01 0700 In: 1920 [P.O.:1320; I.V.:600] Out: 3 [Urine:3] Intake/Output this shift: Total I/O In: -  Out: 200 [Urine:200]  General appearance: alert, cooperative and no distress Resp: clear to auscultation bilaterally Cardio: regular rate and rhythm, S1, S2 normal, no murmur, click, rub or gallop GI: + BS; soft; minimally tender Midline incision c/d/i  RLQ wound  - clean, granulating well.   Lab Results:   Recent Labs  11/26/13 0430 11/27/13 0655  WBC 11.0* 10.4  HGB 12.4 12.5  HCT 37.8 39.1  PLT 228 235   BMET  Recent Labs  11/26/13 0430 11/27/13 0655  NA 145 146  K 4.2 3.8  CL 109 109  CO2 27 25  GLUCOSE 109* 109*  BUN 7 6  CREATININE 0.90 0.89  CALCIUM 8.7 8.9   PT/INR No results for input(s): LABPROT, INR in the last 72 hours. ABG No results for input(s): PHART, HCO3 in the last 72 hours.  Invalid input(s): PCO2, PO2  Studies/Results: No results found.  Anti-infectives: Anti-infectives    Start     Dose/Rate Route Frequency Ordered Stop   11/25/13 1400  cefOXitin (MEFOXIN) 1 g in dextrose 5 % 50 mL IVPB     1 g100 mL/hr over 30 Minutes Intravenous Every 6 hours 11/25/13 1258 11/26/13 0131   11/25/13 0600  cefOXitin (MEFOXIN) 2 g in dextrose 5 % 50 mL IVPB     2 g100 mL/hr over 30 Minutes Intravenous On call to O.R. 11/24/13 1427 11/25/13 0745      Assessment/Plan: s/p Procedure(s): COLOSTOMY REVERSAL (N/A) LAPAROSCOPIC LYSIS OF ADHESIONS 65min (N/A) Advance diet D/C  PCA; use PO pain meds with IV backup  LOS: 3 days    Keygan Dumond K. 11/28/2013

## 2013-11-28 NOTE — Assessment & Plan Note (Signed)
Okay to continue allergy vaccine and refill Singulair

## 2013-11-29 NOTE — Care Management Note (Signed)
  Page 2 of 2   12/06/2013     9:43:14 AM CARE MANAGEMENT NOTE 12/06/2013  Patient:  [name], [name]   Account Number:  [id number]  Date Initiated:  11/29/2013  Documentation initiated by:  Magdalen Spatz  Subjective/Objective Assessment:     Action/Plan:   Anticipated DC Date:  12/06/2013   Anticipated DC Plan:  [place]         Choice offered to / List presented to:  C-1 Patient        [place] arranged  HH-1 RN      [place]   Status of service:  Completed, signed off Medicare Important Message given?  YES (If response is "NO", the following Medicare IM given date fields will be blank) Date Medicare IM given:  12/03/2013 Medicare IM given by:  Magdalen Spatz Date Additional Medicare IM given:  12/06/2013 Additional Medicare IM given by:  Magdalen Spatz  Discharge Disposition:  [place]  Per UR Regulation:  Reviewed for med. necessity/level of care/duration of stay  If discussed at [place] of Stay Meetings, dates discussed:   11/30/2013  12/02/2013    Comments:  12-06-13 Patient discharging today . Spoke to [place] at [facility], discharge summary faxed to [place] . Patient will have [place] visit tomorrow 12-07-13 . Magdalen Spatz RN BSN [phone]   12-02-13 Updated [facility] , regarding possible discharge tomorrow 12-03-13 . If patient is discharged tomorrow , first home health visit would not be until Monday Dec 06, 2013 . Patient aware and would like to [facility] , because of prior use .  Discharge date unsure at present.  Bedside nurse aware and will start teaching with patient and husband. Magdalen Spatz RN BSN  12-01-13 Updated Gay Filler at [facility] regarding discharge plan. Updated clinical faxed to [facility]. Magdalen Spatz RN BSN [phone]   11-30-13 Gay Filler at [facility] aware discharge delayed , planning for 12-01-13 . Today's progress note faxed to [facility] .  Magdalen Spatz RN BSN [phone]   11-29-13  Confirmed face sheet information with patient. Her husband Reyne Dumas ) cell is [phone] . Patient would like to use [facility] . Spoke to [place] at [facility] , accepted referral and can see patient this Wednesday ( plan to to discharge Tuesday from hospital ) . Required information faxed to [facility] ) at [phone] .  Magdalen Spatz RN BSN [phone]

## 2013-11-29 NOTE — Progress Notes (Signed)
CPAP set up at patient bedside. Patient is able to self administer on/off as needed.

## 2013-11-30 LAB — CBC
HCT: 33.7 % — ABNORMAL LOW (ref 36.0–46.0)
HEMOGLOBIN: 10.7 g/dL — AB (ref 12.0–15.0)
MCH: 25 pg — ABNORMAL LOW (ref 26.0–34.0)
MCHC: 31.8 g/dL (ref 30.0–36.0)
MCV: 78.7 fL (ref 78.0–100.0)
Platelets: 282 10*3/uL (ref 150–400)
RBC: 4.28 MIL/uL (ref 3.87–5.11)
RDW: 14.5 % (ref 11.5–15.5)
WBC: 9 10*3/uL (ref 4.0–10.5)

## 2013-11-30 LAB — BASIC METABOLIC PANEL
Anion gap: 12 (ref 5–15)
BUN: 6 mg/dL (ref 6–23)
CHLORIDE: 105 meq/L (ref 96–112)
CO2: 26 mEq/L (ref 19–32)
Calcium: 9 mg/dL (ref 8.4–10.5)
Creatinine, Ser: 0.96 mg/dL (ref 0.50–1.10)
GFR calc non Af Amer: 61 mL/min — ABNORMAL LOW (ref >90)
GFR, EST AFRICAN AMERICAN: 71 mL/min — AB (ref >90)
GLUCOSE: 118 mg/dL — AB (ref 70–99)
POTASSIUM: 3.6 meq/L — AB (ref 3.7–5.3)
Sodium: 143 mEq/L (ref 137–147)

## 2013-11-30 MED ORDER — AMOXICILLIN-POT CLAVULANATE 875-125 MG PO TABS
1.0000 | ORAL_TABLET | Freq: Two times a day (BID) | ORAL | Status: DC
  Administered 2013-11-30 (×2): 1 via ORAL
  Filled 2013-11-30 (×4): qty 1

## 2013-11-30 MED ORDER — CIPROFLOXACIN HCL 500 MG PO TABS: 500.0000 mg | ORAL_TABLET | Freq: Two times a day (BID) | ORAL | Status: DC

## 2013-11-30 MED ORDER — OXYCODONE-ACETAMINOPHEN 5-325 MG PO TABS: 1.0000 | ORAL_TABLET | ORAL | Status: DC | PRN

## 2013-11-30 NOTE — Discharge Instructions (Signed)
[facility]      [facility] [phone]  OPEN ABDOMINAL SURGERY: POST OP INSTRUCTIONS  Always review your discharge instruction sheet given to you by the facility where your surgery was performed.  IF YOU HAVE DISABILITY OR FAMILY LEAVE FORMS, YOU MUST BRING THEM TO THE OFFICE FOR PROCESSING.  PLEASE DO NOT GIVE THEM TO YOUR DOCTOR.  1. A prescription for pain medication may be given to you upon discharge.  Take your pain medication as prescribed, if needed.  If narcotic pain medicine is not needed, then you may take acetaminophen (Tylenol) or ibuprofen (Advil) as needed. 2. Take your usually prescribed medications unless otherwise directed. 3. If you need a refill on your pain medication, please contact your pharmacy. They will contact our office to request authorization.  Prescriptions will not be filled after 5pm or on week-ends. 4. You should follow a light diet the first few days after arrival home, such as soup and crackers, pudding, etc.unless your doctor has advised otherwise. A high-fiber, low fat diet can be resumed as tolerated.   Be sure to include lots of fluids daily. Most patients will experience some swelling and bruising on the chest and neck area.  Ice packs will help.  Swelling and bruising can take several days to resolve 5. Most patients will experience some swelling and bruising in the area of the incision. Ice pack will help. Swelling and bruising can take several days to resolve..  6. It is common to experience some constipation if taking pain medication after surgery.  Increasing fluid intake and taking a stool softener will usually help or prevent this problem from occurring.  A mild laxative (Milk of Magnesia or Miralax) should be taken according to package directions if there are no bowel movements after 48 hours. 7.  You may have steri-strips (small skin tapes) in place directly over the incision.  These strips should be left on the skin for 7-10 days.  If your  surgeon used skin glue on the incision, you may shower in 24 hours.  The glue will flake off over the next 2-3 weeks.  Any sutures or staples will be removed at the office during your follow-up visit. You may find that a light gauze bandage over your incision may keep your staples from being rubbed or pulled. You may shower and replace the bandage daily. 8. ACTIVITIES:  You may resume regular (light) daily activities beginning the next day--such as daily self-care, walking, climbing stairs--gradually increasing activities as tolerated.  You may have sexual intercourse when it is comfortable.  Refrain from any heavy lifting or straining until approved by your doctor. a. You may drive when you no longer are taking prescription pain medication, you can comfortably wear a seatbelt, and you can safely maneuver your car and apply brakes b. Return to Work: ___________________________________ 9. You should see your doctor in the office for a follow-up appointment approximately two weeks after your surgery.  Make sure that you call for this appointment within a day or two after you arrive home to insure a convenient appointment time. OTHER INSTRUCTIONS:  _____________________________________________________________ _____________________________________________________________  WHEN TO CALL YOUR DOCTOR: 1. Fever over 101.0 2. Inability to urinate 3. Nausea and/or vomiting 4. Extreme swelling or bruising 5. Continued bleeding from incision. 6. Increased pain, redness, or drainage from the incision. 7. Difficulty swallowing or breathing 8. Muscle cramping or spasms. 9. Numbness or tingling in hands or feet or around lips.  The clinic staff is available to   answer your questions during regular business hours.  Please don't hesitate to call and ask to speak to one of the nurses if you have concerns.  For further questions, please visit [email or web address]   

## 2013-11-30 NOTE — Progress Notes (Signed)
5 Days Post-Op  Subjective: Complaining mostly of pain below lower end of incision Still having bowel movements Tolerating regular diet  Objective: Vital signs in last 24 hours: Temp:  [98.7 F (37.1 C)-99.1 F (37.3 C)] 98.7 F (37.1 C) (11/03 0541) Pulse Rate:  [89-100] 92 (11/03 0541) Resp:  [18-20] 19 (11/03 0541) BP: (129-131)/(67-76) 129/74 mmHg (11/03 0541) SpO2:  [96 %-99 %] 99 % (11/03 0541) FiO2 (%):  [95 %] 95 % (11/02 2133) Last BM Date: 11/29/13 (soft)  Intake/Output from previous day: 11/02 0701 - 11/03 0700 In: 360 [P.O.:360] Out: 1651 [Urine:1650; Stool:1] Intake/Output this shift: Total I/O In: -  Out: 400 [Urine:400]  General appearance: alert, cooperative and no distress Resp: clear to auscultation bilaterally Cardio: regular rate and rhythm, S1, S2 normal, no murmur, click, rub or gallop GI: soft, + bowel sounds Incision c/d/i.   In her pannus below the incision, there is some induration and erythema.  This area is quite tender.  I removed the last four staples and opened the incision.  I probed the wound with cotton swabs.  No purulence is noted.  No seroma noted.  No hematoma noted.  This appears to be cellulitis/ panniculitis.  Lab Results:  No results for input(s): WBC, HGB, HCT, PLT in the last 72 hours. BMET No results for input(s): NA, K, CL, CO2, GLUCOSE, BUN, CREATININE, CALCIUM in the last 72 hours. PT/INR No results for input(s): LABPROT, INR in the last 72 hours. ABG No results for input(s): PHART, HCO3 in the last 72 hours.  Invalid input(s): PCO2, PO2  Studies/Results: No results found.  Anti-infectives:  Assessment/Plan: s/p Procedure(s): COLOSTOMY REVERSAL (N/A) LAPAROSCOPIC LYSIS OF ADHESIONS 59min (N/A) Will hold off discharge today  Start PO Augmentin to try to treat this cellulitis  LOS: 5 days    [name] K. 11/30/2013

## 2013-12-01 MED ORDER — FLUCONAZOLE IN SODIUM CHLORIDE 400-0.9 MG/200ML-% IV SOLN
400.0000 mg | INTRAVENOUS | Status: DC
  Administered 2013-12-01 – 2013-12-05 (×5): 400 mg via INTRAVENOUS
  Filled 2013-12-01 (×5): qty 200

## 2013-12-01 MED ORDER — PIPERACILLIN-TAZOBACTAM 3.375 G IVPB
3.3750 g | Freq: Three times a day (TID) | INTRAVENOUS | Status: DC
  Administered 2013-12-01 – 2013-12-05 (×13): 3.375 g via INTRAVENOUS
  Filled 2013-12-01 (×17): qty 50

## 2013-12-01 MED ORDER — SODIUM CHLORIDE 0.45 % IV SOLN
INTRAVENOUS | Status: DC
  Administered 2013-12-01 – 2013-12-05 (×5): via INTRAVENOUS

## 2013-12-01 NOTE — Progress Notes (Signed)
Checked with pt about wearing CPAP tonight. Pt states she will place herself on later tonight. Humidifier chamber filled with sterile water. Machine and mask placed near pt for easy access. Told pt to notify for RT if any further assistance is needed throughout the night.

## 2013-12-01 NOTE — Plan of Care (Signed)
Problem: Phase I Progression Outcomes Goal: OOB as tolerated unless otherwise ordered Outcome: Completed/Met Date Met:  12/01/13

## 2013-12-01 NOTE — Progress Notes (Signed)
6 Days Post-Op  Subjective: Still with some abdominal wall tenderness in pannus. Still having bowel movements, tolerating regular diet  Objective: Vital signs in last 24 hours: Temp:  [98.1 F (36.7 C)-98.7 F (37.1 C)] 98.7 F (37.1 C) (11/04 0645) Pulse Rate:  [84-97] 84 (11/04 0645) Resp:  [19-20] 19 (11/04 0645) BP: (118-142)/(64-82) 124/69 mmHg (11/04 0645) SpO2:  [93 %-98 %] 98 % (11/04 0645) Last BM Date: 11/30/13  Intake/Output from previous day: 11/03 0701 - 11/04 0700 In: 460 [P.O.:460] Out: 400 [Urine:400] Intake/Output this shift:    General appearance: alert, cooperative and no distress Resp: clear to auscultation bilaterally Cardio: regular rate and rhythm, S1, S2 normal, no murmur, click, rub or gallop GI: soft, + bowel sounds, incisional tenderness Moderate erythema in midline under edge of pannus; skin is intact with no drainage; adipose tissue feels thickened and firm RLQ and lower midline wounds - clean; granulating Lab Results:   Recent Labs  11/30/13 1500  WBC 9.0  HGB 10.7*  HCT 33.7*  PLT 282   BMET  Recent Labs  11/30/13 1500  NA 143  K 3.6*  CL 105  CO2 26  GLUCOSE 118*  BUN 6  CREATININE 0.96  CALCIUM 9.0   PT/INR No results for input(s): LABPROT, INR in the last 72 hours. ABG No results for input(s): PHART, HCO3 in the last 72 hours.  Invalid input(s): PCO2, PO2  Studies/Results: No results found.  Anti-infectives: Anti-infectives    Start     Dose/Rate Route Frequency Ordered Stop   12/01/13 0845  piperacillin-tazobactam (ZOSYN) IVPB 3.375 g     3.375 g12.5 mL/hr over 240 Minutes Intravenous 3 times per day 12/01/13 0816     11/30/13 1000  amoxicillin-clavulanate (AUGMENTIN) 875-125 MG per tablet 1 tablet  Status:  Discontinued     1 tablet Oral Every 12 hours 11/30/13 0812 12/01/13 0816   11/30/13 0900  ciprofloxacin (CIPRO) tablet 500 mg  Status:  Discontinued     500 mg Oral 2 times daily 11/30/13 0807 11/30/13  0812   11/25/13 1400  cefOXitin (MEFOXIN) 1 g in dextrose 5 % 50 mL IVPB     1 g100 mL/hr over 30 Minutes Intravenous Every 6 hours 11/25/13 1258 11/26/13 0131   11/25/13 0600  cefOXitin (MEFOXIN) 2 g in dextrose 5 % 50 mL IVPB     2 g100 mL/hr over 30 Minutes Intravenous On call to O.R. 11/24/13 1427 11/25/13 0745      Assessment/Plan: s/p Procedure(s): COLOSTOMY REVERSAL (N/A) LAPAROSCOPIC LYSIS OF ADHESIONS 53min (N/A) Panniculitis/ cellulitis - minimal response to Augmentin  Will switch back to IV Zosyn and also add Diflucan empirically for yeast.  If this improves, possible discharge tomorrow on PO Augmentin and Diflucan.   LOS: 6 days    Carmella Kees K. 12/01/2013

## 2013-12-02 LAB — CREATININE, SERUM
Creatinine, Ser: 0.97 mg/dL (ref 0.50–1.10)
GFR, EST AFRICAN AMERICAN: 70 mL/min — AB (ref >90)
GFR, EST NON AFRICAN AMERICAN: 60 mL/min — AB (ref >90)

## 2013-12-02 NOTE — Progress Notes (Signed)
7 Days Post-Op  Subjective: Less tenderness and erythema below incision, but more erythema around lower half of incision. Patient states that the lower opening of the incision has been draining more Appetite and bowel movements improving  Objective: Vital signs in last 24 hours: Temp:  [98.3 F (36.8 C)-99 F (37.2 C)] 98.7 F (37.1 C) (11/05 0635) Pulse Rate:  [75-96] 75 (11/05 0635) Resp:  [18-20] 18 (11/05 0635) BP: (118-131)/(62-75) 120/62 mmHg (11/05 0635) SpO2:  [91 %-98 %] 91 % (11/05 0635) Last BM Date: 12/02/13  Intake/Output from previous day: 11/04 0701 - 11/05 0700 In: 720 [P.O.:720] Out: 350 [Urine:350] Intake/Output this shift:    General appearance: alert, cooperative and no distress Resp: clear to auscultation bilaterally Cardio: regular rate and rhythm, S1, S2 normal, no murmur, click, rub or gallop GI: soft, incisional tenderness; less panniculitis, less tender below incision RLQ wound - granulating well; some yellowish drainage on gauze The lower part of the wound was opened some more.  I drained a large pocket of seroma/ necrotic fat; no purulence noted; no odor Lab Results:   Recent Labs  11/30/13 1500  WBC 9.0  HGB 10.7*  HCT 33.7*  PLT 282   BMET  Recent Labs  11/30/13 1500 12/02/13 0358  NA 143  --   K 3.6*  --   CL 105  --   CO2 26  --   GLUCOSE 118*  --   BUN 6  --   CREATININE 0.96 0.97  CALCIUM 9.0  --    PT/INR No results for input(s): LABPROT, INR in the last 72 hours. ABG No results for input(s): PHART, HCO3 in the last 72 hours.  Invalid input(s): PCO2, PO2  Studies/Results: No results found.  Anti-infectives: Anti-infectives    Start     Dose/Rate Route Frequency Ordered Stop   12/01/13 0930  fluconazole (DIFLUCAN) IVPB 400 mg     400 mg100 mL/hr over 120 Minutes Intravenous Every 24 hours 12/01/13 0830     12/01/13 0845  piperacillin-tazobactam (ZOSYN) IVPB 3.375 g     3.375 g12.5 mL/hr over 240 Minutes  Intravenous 3 times per day 12/01/13 0816     11/30/13 1000  amoxicillin-clavulanate (AUGMENTIN) 875-125 MG per tablet 1 tablet  Status:  Discontinued     1 tablet Oral Every 12 hours 11/30/13 0812 12/01/13 0816   11/30/13 0900  ciprofloxacin (CIPRO) tablet 500 mg  Status:  Discontinued     500 mg Oral 2 times daily 11/30/13 0807 11/30/13 0812   11/25/13 1400  cefOXitin (MEFOXIN) 1 g in dextrose 5 % 50 mL IVPB     1 g100 mL/hr over 30 Minutes Intravenous Every 6 hours 11/25/13 1258 11/26/13 0131   11/25/13 0600  cefOXitin (MEFOXIN) 2 g in dextrose 5 % 50 mL IVPB     2 g100 mL/hr over 30 Minutes Intravenous On call to O.R. 11/24/13 1427 11/25/13 0745      Assessment/Plan: s/p Procedure(s): COLOSTOMY REVERSAL (N/A) LAPAROSCOPIC LYSIS OF ADHESIONS 75min (N/A) Subcutaneous wound seroma; panniculitis - improving  Continue IV abx at least one more day. BID dressings Possible discharge tomorrow  LOS: 7 days    Dewon Mendizabal K. 12/02/2013

## 2013-12-02 NOTE — Care Management (Addendum)
12-02-13 Updated [facility] , regarding possible discharge tomorrow 12-03-13 . If patient is discharged tomorrow , first home health visit would not be until Monday Dec 06, 2013 . Patient aware and would like to stay with [facility] , because of prior use .  Discharge date unsure at present.  Bedside nurse aware and will start teaching with patient and husband. Magdalen Spatz RN BSN

## 2013-12-03 ENCOUNTER — Encounter: Payer: Self-pay | Admitting: Internal Medicine

## 2013-12-03 NOTE — Progress Notes (Signed)
Pt. Placed cpap on herself. Tolerating well at this time.

## 2013-12-03 NOTE — Progress Notes (Signed)
8 Days Post-Op  Subjective: Still with pain down in pannus Continues to have bowel movements and tolerating regular diet  Objective: Vital signs in last 24 hours: Temp:  [98 F (36.7 C)-99 F (37.2 C)] 99 F (37.2 C) (11/06 0630) Pulse Rate:  [77-90] 81 (11/06 0630) Resp:  [18-19] 19 (11/06 0630) BP: (116-141)/(65-76) 141/68 mmHg (11/06 0630) SpO2:  [96 %-98 %] 98 % (11/06 0630) Last BM Date: 12/02/13  Intake/Output from previous day: 11/05 0701 - 11/06 0700 In: 1806.2 [P.O.:530; I.V.:1197.2; IV Piggyback:79] Out: -  Intake/Output this shift:    General appearance: alert, cooperative and no distress Resp: clear to auscultation bilaterally Cardio: regular rate and rhythm, S1, S2 normal, no murmur, click, rub or gallop GI: obese, soft, only tenderness is in pannus below incision - still indurated but less erythema RLQ wound - clean; some drainage; granulating well Lower midline incision - Fascia seems to be intact; serous drainage only;  Lab Results:   Recent Labs  11/30/13 1500  WBC 9.0  HGB 10.7*  HCT 33.7*  PLT 282   BMET  Recent Labs  11/30/13 1500 12/02/13 0358  NA 143  --   K 3.6*  --   CL 105  --   CO2 26  --   GLUCOSE 118*  --   BUN 6  --   CREATININE 0.96 0.97  CALCIUM 9.0  --    PT/INR No results for input(s): LABPROT, INR in the last 72 hours. ABG No results for input(s): PHART, HCO3 in the last 72 hours.  Invalid input(s): PCO2, PO2  Studies/Results: No results found.  Anti-infectives: Anti-infectives    Start     Dose/Rate Route Frequency Ordered Stop   12/01/13 0930  fluconazole (DIFLUCAN) IVPB 400 mg     400 mg100 mL/hr over 120 Minutes Intravenous Every 24 hours 12/01/13 0830     12/01/13 0845  piperacillin-tazobactam (ZOSYN) IVPB 3.375 g     3.375 g12.5 mL/hr over 240 Minutes Intravenous 3 times per day 12/01/13 0816     11/30/13 1000  amoxicillin-clavulanate (AUGMENTIN) 875-125 MG per tablet 1 tablet  Status:  Discontinued     1  tablet Oral Every 12 hours 11/30/13 0812 12/01/13 0816   11/30/13 0900  ciprofloxacin (CIPRO) tablet 500 mg  Status:  Discontinued     500 mg Oral 2 times daily 11/30/13 0807 11/30/13 0812   11/25/13 1400  cefOXitin (MEFOXIN) 1 g in dextrose 5 % 50 mL IVPB     1 g100 mL/hr over 30 Minutes Intravenous Every 6 hours 11/25/13 1258 11/26/13 0131   11/25/13 0600  cefOXitin (MEFOXIN) 2 g in dextrose 5 % 50 mL IVPB     2 g100 mL/hr over 30 Minutes Intravenous On call to O.R. 11/24/13 1427 11/25/13 0745      Assessment/Plan: s/p Procedure(s): COLOSTOMY REVERSAL (N/A) LAPAROSCOPIC LYSIS OF ADHESIONS 40min (N/A) Lower abdominal cellulitis/ panniculitis; wound seroma - evacuated  Continue IV abx until induration/ panniculitis improves before transitioning to PO abx Will likely be here through the weekend - home health not available until Monday.   LOS: 8 days    Zorina Mallin K. 12/03/2013

## 2013-12-04 LAB — CBC
HCT: 29.5 % — ABNORMAL LOW (ref 36.0–46.0)
HEMOGLOBIN: 9.6 g/dL — AB (ref 12.0–15.0)
MCH: 25.5 pg — ABNORMAL LOW (ref 26.0–34.0)
MCHC: 32.5 g/dL (ref 30.0–36.0)
MCV: 78.5 fL (ref 78.0–100.0)
PLATELETS: 333 10*3/uL (ref 150–400)
RBC: 3.76 MIL/uL — ABNORMAL LOW (ref 3.87–5.11)
RDW: 14.6 % (ref 11.5–15.5)
WBC: 6.4 10*3/uL (ref 4.0–10.5)

## 2013-12-04 LAB — BASIC METABOLIC PANEL
ANION GAP: 12 (ref 5–15)
BUN: 5 mg/dL — ABNORMAL LOW (ref 6–23)
CO2: 27 mEq/L (ref 19–32)
Calcium: 8.7 mg/dL (ref 8.4–10.5)
Chloride: 105 mEq/L (ref 96–112)
Creatinine, Ser: 0.88 mg/dL (ref 0.50–1.10)
GFR calc Af Amer: 79 mL/min — ABNORMAL LOW (ref >90)
GFR calc non Af Amer: 68 mL/min — ABNORMAL LOW (ref >90)
GLUCOSE: 85 mg/dL (ref 70–99)
POTASSIUM: 3.4 meq/L — AB (ref 3.7–5.3)
SODIUM: 144 meq/L (ref 137–147)

## 2013-12-04 LAB — CLOSTRIDIUM DIFFICILE BY PCR: CDIFFPCR: NEGATIVE

## 2013-12-04 NOTE — Progress Notes (Signed)
Pt stated she was not ready to go on CPAP at this time and would contact RT when ready later in the evening.

## 2013-12-04 NOTE — Progress Notes (Signed)
9 Days Post-Op  Subjective: Feels better today  Objective: Vital signs in last 24 hours: Temp:  [98.2 F (36.8 C)] 98.2 F (36.8 C) (11/07 0658) Pulse Rate:  [65-75] 75 (11/07 0658) Resp:  [18] 18 (11/07 0658) BP: (120-125)/(68-70) 120/70 mmHg (11/07 0658) SpO2:  [94 %-97 %] 97 % (11/07 0658) Last BM Date: 12/04/13  Intake/Output from previous day: 11/06 0701 - 11/07 0700 In: 9147 [P.O.:840; I.V.:600; IV Piggyback:250] Out: 150 [Urine:150] Intake/Output this shift:    Resp: clear to auscultation bilaterally Cardio: regular rate and rhythm GI: soft, minimal tenderness  Lab Results:   Recent Labs  12/04/13 0425  WBC 6.4  HGB 9.6*  HCT 29.5*  PLT 333   BMET  Recent Labs  12/02/13 0358 12/04/13 0425  NA  --  144  K  --  3.4*  CL  --  105  CO2  --  27  GLUCOSE  --  85  BUN  --  5*  CREATININE 0.97 0.88  CALCIUM  --  8.7   PT/INR No results for input(Brandt): LABPROT, INR in the last 72 hours. ABG No results for input(Brandt): PHART, HCO3 in the last 72 hours.  Invalid input(Brandt): PCO2, PO2  Studies/Results: No results found.  Anti-infectives: Anti-infectives    Start     Dose/Rate Route Frequency Ordered Stop   12/01/13 0930  fluconazole (DIFLUCAN) IVPB 400 mg     400 mg100 mL/hr over 120 Minutes Intravenous Every 24 hours 12/01/13 0830     12/01/13 0845  piperacillin-tazobactam (ZOSYN) IVPB 3.375 g     3.375 g12.5 mL/hr over 240 Minutes Intravenous 3 times per day 12/01/13 0816     11/30/13 1000  amoxicillin-clavulanate (AUGMENTIN) 875-125 MG per tablet 1 tablet  Status:  Discontinued     1 tablet Oral Every 12 hours 11/30/13 0812 12/01/13 0816   11/30/13 0900  ciprofloxacin (CIPRO) tablet 500 mg  Status:  Discontinued     500 mg Oral 2 times daily 11/30/13 0807 11/30/13 0812   11/25/13 1400  cefOXitin (MEFOXIN) 1 g in dextrose 5 % 50 mL IVPB     1 g100 mL/hr over 30 Minutes Intravenous Every 6 hours 11/25/13 1258 11/26/13 0131   11/25/13 0600  cefOXitin  (MEFOXIN) 2 g in dextrose 5 % 50 mL IVPB     2 g100 mL/hr over 30 Minutes Intravenous On call to O.R. 11/24/13 1427 11/25/13 0745      Assessment/Plan: Brandt/p Procedure(Brandt): COLOSTOMY REVERSAL (N/A) LAPAROSCOPIC LYSIS OF ADHESIONS 24min (N/A) Sitting up eating breakfast today and looks good  Continue abx until cellulitis resolves Will check wound at dressing change  LOS: 9 days    [name],[name] 12/04/2013

## 2013-12-04 NOTE — Plan of Care (Signed)
Problem: Phase I Progression Outcomes Goal: Sutures/staples intact Outcome: Completed/Met Date Met:  12/04/13 Goal: Vital signs/hemodynamically stable Outcome: Completed/Met Date Met:  12/04/13  Problem: Phase II Progression Outcomes Goal: Pain controlled Outcome: Completed/Met Date Met:  12/04/13 Goal: Progress activity as tolerated unless otherwise ordered Outcome: Completed/Met Date Met:  12/04/13 Goal: Vital signs stable Outcome: Completed/Met Date Met:  12/04/13 Goal: Dressings dry/intact Outcome: Completed/Met Date Met:  12/04/13 Goal: Foley discontinued Outcome: Completed/Met Date Met:  12/04/13 Goal: Tolerating diet Outcome: Completed/Met Date Met:  12/04/13  Problem: Phase III Progression Outcomes Goal: Pain controlled on oral analgesia Outcome: Completed/Met Date Met:  12/04/13 Goal: Activity at appropriate level-compared to baseline (UP IN CHAIR FOR HEMODIALYSIS)  Outcome: Completed/Met Date Met:  12/04/13 Goal: Voiding independently Outcome: Completed/Met Date Met:  12/04/13 Goal: Nasogastric tube discontinued Outcome: Completed/Met Date Met:  12/04/13

## 2013-12-05 MED ORDER — FLUCONAZOLE 200 MG PO TABS
400.0000 mg | ORAL_TABLET | Freq: Every day | ORAL | Status: DC
  Administered 2013-12-06: 400 mg via ORAL
  Filled 2013-12-05: qty 2

## 2013-12-05 MED ORDER — AMOXICILLIN-POT CLAVULANATE 875-125 MG PO TABS
1.0000 | ORAL_TABLET | Freq: Two times a day (BID) | ORAL | Status: DC
  Administered 2013-12-05 – 2013-12-06 (×3): 1 via ORAL
  Filled 2013-12-05 (×3): qty 1

## 2013-12-05 NOTE — Progress Notes (Signed)
10 Days Post-Op  Subjective: No complaints other than some shooting pains that come and go  Objective: Vital signs in last 24 hours: Temp:  [98 F (36.7 C)-98.7 F (37.1 C)] 98 F (36.7 C) (11/08 0523) Pulse Rate:  [64-76] 64 (11/08 0523) Resp:  [17-19] 17 (11/08 0523) BP: (134-137)/(74-80) 137/74 mmHg (11/08 0523) SpO2:  [94 %-97 %] 97 % (11/08 0523) Last BM Date: 12/05/13  Intake/Output from previous day:   Intake/Output this shift:    Resp: clear to auscultation bilaterally Cardio: regular rate and rhythm GI: soft, wound relatively clean and cellulitis improved  Lab Results:   Recent Labs  12/04/13 0425  WBC 6.4  HGB 9.6*  HCT 29.5*  PLT 333   BMET  Recent Labs  12/04/13 0425  NA 144  K 3.4*  CL 105  CO2 27  GLUCOSE 85  BUN 5*  CREATININE 0.88  CALCIUM 8.7   PT/INR No results for input(s): LABPROT, INR in the last 72 hours. ABG No results for input(s): PHART, HCO3 in the last 72 hours.  Invalid input(s): PCO2, PO2  Studies/Results: No results found.  Anti-infectives: Anti-infectives    Start     Dose/Rate Route Frequency Ordered Stop   12/05/13 1000  amoxicillin-clavulanate (AUGMENTIN) 875-125 MG per tablet 1 tablet     1 tablet Oral Every 12 hours 12/05/13 0927     12/01/13 0930  fluconazole (DIFLUCAN) IVPB 400 mg     400 mg100 mL/hr over 120 Minutes Intravenous Every 24 hours 12/01/13 0830     12/01/13 0845  piperacillin-tazobactam (ZOSYN) IVPB 3.375 g  Status:  Discontinued     3.375 g12.5 mL/hr over 240 Minutes Intravenous 3 times per day 12/01/13 0816 12/05/13 0927   11/30/13 1000  amoxicillin-clavulanate (AUGMENTIN) 875-125 MG per tablet 1 tablet  Status:  Discontinued     1 tablet Oral Every 12 hours 11/30/13 0812 12/01/13 0816   11/30/13 0900  ciprofloxacin (CIPRO) tablet 500 mg  Status:  Discontinued     500 mg Oral 2 times daily 11/30/13 0807 11/30/13 0812   11/25/13 1400  cefOXitin (MEFOXIN) 1 g in dextrose 5 % 50 mL IVPB     1  g100 mL/hr over 30 Minutes Intravenous Every 6 hours 11/25/13 1258 11/26/13 0131   11/25/13 0600  cefOXitin (MEFOXIN) 2 g in dextrose 5 % 50 mL IVPB     2 g100 mL/hr over 30 Minutes Intravenous On call to O.R. 11/24/13 1427 11/25/13 0745      Assessment/Plan: s/p Procedure(s): COLOSTOMY REVERSAL (N/A) LAPAROSCOPIC LYSIS OF ADHESIONS 69min (N/A) Will switch to augmentin today and stop Zosyn  Continue dressing changes  LOS: 10 days    [name],Darris Carachure S 12/05/2013

## 2013-12-06 MED ORDER — AMOXICILLIN-POT CLAVULANATE 875-125 MG PO TABS: 1.0000 | ORAL_TABLET | Freq: Two times a day (BID) | ORAL | Status: DC

## 2013-12-06 MED ORDER — FLUCONAZOLE 200 MG PO TABS: 400.0000 mg | ORAL_TABLET | Freq: Every day | ORAL | Status: DC

## 2013-12-06 NOTE — Progress Notes (Signed)
11 Days Post-Op  Subjective: Still slightly sore in pannus Switched to oral abx yesterday. No other significant complaints  Objective: Vital signs in last 24 hours: Temp:  [97.8 F (36.6 C)-98.8 F (37.1 C)] 97.8 F (36.6 C) (11/09 0541) Pulse Rate:  [54-74] 54 (11/09 0541) Resp:  [16-18] 18 (11/09 0541) BP: (130-135)/(69-73) 130/73 mmHg (11/09 0541) SpO2:  [93 %-98 %] 98 % (11/09 0541) Last BM Date: 12/05/13  Intake/Output from previous day: 11/08 0701 - 11/09 0700 In: 2766.7 [P.O.:720; I.V.:2046.7] Out: -  Intake/Output this shift:    General appearance: alert, cooperative and no distress Resp: clear to auscultation bilaterally Cardio: regular rate and rhythm GI: soft, minimal tenderness Still mildly thickened and tender in pannus just below end of incision Wounds clean; granulating; minimal drainage  Lab Results:   Recent Labs  12/04/13 0425  WBC 6.4  HGB 9.6*  HCT 29.5*  PLT 333   BMET  Recent Labs  12/04/13 0425  NA 144  K 3.4*  CL 105  CO2 27  GLUCOSE 85  BUN 5*  CREATININE 0.88  CALCIUM 8.7   PT/INR No results for input(s): LABPROT, INR in the last 72 hours. ABG No results for input(s): PHART, HCO3 in the last 72 hours.  Invalid input(s): PCO2, PO2  Studies/Results: No results found.  Anti-infectives: Anti-infectives    Start     Dose/Rate Route Frequency Ordered Stop   12/06/13 1000  fluconazole (DIFLUCAN) tablet 400 mg     400 mg Oral Daily 12/05/13 1021     12/05/13 1000  amoxicillin-clavulanate (AUGMENTIN) 875-125 MG per tablet 1 tablet     1 tablet Oral Every 12 hours 12/05/13 0927     12/01/13 0930  fluconazole (DIFLUCAN) IVPB 400 mg  Status:  Discontinued     400 mg100 mL/hr over 120 Minutes Intravenous Every 24 hours 12/01/13 0830 12/05/13 1021   12/01/13 0845  piperacillin-tazobactam (ZOSYN) IVPB 3.375 g  Status:  Discontinued     3.375 g12.5 mL/hr over 240 Minutes Intravenous 3 times per day 12/01/13 0816 12/05/13 0927   11/30/13 1000  amoxicillin-clavulanate (AUGMENTIN) 875-125 MG per tablet 1 tablet  Status:  Discontinued     1 tablet Oral Every 12 hours 11/30/13 0812 12/01/13 0816   11/30/13 0900  ciprofloxacin (CIPRO) tablet 500 mg  Status:  Discontinued     500 mg Oral 2 times daily 11/30/13 0807 11/30/13 0812   11/25/13 1400  cefOXitin (MEFOXIN) 1 g in dextrose 5 % 50 mL IVPB     1 g100 mL/hr over 30 Minutes Intravenous Every 6 hours 11/25/13 1258 11/26/13 0131   11/25/13 0600  cefOXitin (MEFOXIN) 2 g in dextrose 5 % 50 mL IVPB     2 g100 mL/hr over 30 Minutes Intravenous On call to O.R. 11/24/13 1427 11/25/13 0745      Assessment/Plan: s/p Procedure(s): COLOSTOMY REVERSAL (N/A) LAPAROSCOPIC LYSIS OF ADHESIONS 55min (N/A) Will d/c staples prior to discharge  HHN to perform daily dressing changes to the two wounds Continue abx   LOS: 11 days    Tarsha Blando K. 12/06/2013

## 2013-12-06 NOTE — Discharge Summary (Signed)
Physician Discharge Summary  Patient ID: [name] MRN: [id number] DOB/AGE: 1950/04/25 63 y.o.  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  She had a post-operative ileus that lasted a few days, but resolved and she began having bowel movements.  However, she developed tenderness and induration in her pannus several centimeters away from her incision.  She is fairly obese and this seemed to be a panniculitis.  However, since it was next to a colon surgery incision, we covered her with abx for Gram positive and negative bacteria as well as yeast.  Her lower wound was opened and showed a seroma but no abscess.  The lower midline wound as well as the RLQ ostomy site were treated with wet to dry dressings.  She improved and is ready to go home with home health nursing  Consults: None  Significant Diagnostic Studies: none  Treatments: surgery: colostomy reversal  Discharge Exam: Blood pressure 130/73, pulse 54, temperature 97.8 F (36.6 C), temperature source Axillary, resp. rate 18, height 5\' 2"  (1.575 m), weight 235 lb (106.595 kg), SpO2 98 %. General appearance: alert, cooperative and no distress Resp: clear to auscultation bilaterally GI: soft, non-tender except area just below end of incision in pannus No erythema; mild thickening of the subcutaneous fat    Disposition: Home with home health nursing for wound care  Discharge Instructions    Call MD for:  persistant nausea and vomiting    Complete by:  As directed      Call MD for:  persistant nausea and vomiting    Complete by:  As directed      Call MD for:  redness, tenderness, or signs of infection (pain, swelling, redness, odor or green/yellow discharge  around incision site)    Complete by:  As directed      Call MD for:  redness, tenderness, or signs of infection (pain, swelling, redness, odor or green/yellow discharge around incision site)    Complete by:  As directed      Call MD for:  severe uncontrolled pain    Complete by:  As directed      Call MD for:  severe uncontrolled pain    Complete by:  As directed      Call MD for:  temperature >100.4    Complete by:  As directed      Call MD for:  temperature >100.4    Complete by:  As directed      Change dressing (specify)    Complete by:  As directed   Dressing change: one time per day using normal saline, 4x4 gauze and tape.  Daily wet to dry dressing changes to RLQ wound.  Home Health Nursing Keep staple line covered with dry dressing as needed.     Change dressing (specify)    Complete by:  As directed   Dressing change: one time per day using NS wet to dry 4x4 gauze  Home health nursing .     Diet general    Complete by:  As directed      Diet general    Complete by:  As directed      Driving Restrictions    Complete by:  As directed   Do not drive while taking pain medications  Driving Restrictions    Complete by:  As directed   Do not drive while taking pain medications     Increase activity slowly    Complete by:  As directed      Increase activity slowly    Complete by:  As directed      May shower / Bathe    Complete by:  As directed      May shower / Bathe    Complete by:  As directed      May walk up steps    Complete by:  As directed      May walk up steps    Complete by:  As directed             Medication List    STOP taking these medications        HYDROcodone-acetaminophen 5-325 MG per tablet  Commonly known as:  NORCO/VICODIN      TAKE these medications        albuterol (2.5 MG/3ML) 0.083% NEBU 3 mL, albuterol (5 MG/ML) 0.5% NEBU 0.5 mL  Inhale into the lungs 2 (two) times daily. Does treatment right before cpap used     albuterol  108 (90 BASE) MCG/ACT inhaler  Commonly known as:  PROAIR HFA  Inhale 2 puffs into the lungs every 6 (six) hours as needed for wheezing or shortness of breath.     amoxicillin-clavulanate 875-125 MG per tablet  Commonly known as:  AUGMENTIN  Take 1 tablet by mouth every 12 (twelve) hours.     aspirin 81 MG tablet  Take 81 mg by mouth daily.     cetirizine 10 MG tablet  Commonly known as:  ZYRTEC  Take 1 tablet (10 mg total) by mouth daily.     ferrous sulfate 325 (65 FE) MG tablet  Take 325 mg by mouth 2 (two) times daily with a meal.     fluconazole 200 MG tablet  Commonly known as:  DIFLUCAN  Take 2 tablets (400 mg total) by mouth daily.     Fluticasone-Salmeterol 250-50 MCG/DOSE Aepb  Commonly known as:  ADVAIR  Inhale 1 puff into the lungs daily as needed (Shortness of breath).     Fluticasone-Salmeterol 250-50 MCG/DOSE Aepb  Commonly known as:  ADVAIR DISKUS  1 puff, then rinse mouth, twice daily     furosemide 20 MG tablet  Commonly known as:  LASIX  Take 20-40 mg by mouth daily as needed for fluid.     mometasone 50 MCG/ACT nasal spray  Commonly known as:  NASONEX  Place 2 sprays into the nose daily as needed (allergies).     montelukast 10 MG tablet  Commonly known as:  SINGULAIR  Take 1 tablet (10 mg total) by mouth at bedtime.     neomycin 500 MG tablet  Commonly known as:  MYCIFRADIN  Take 2 tablets (1,000 mg total) by mouth as directed. Take 2 pills (=1000mg ) by mouth at 2pm, 3pm, and 10pm the day before your colorectal operation as discussed in [facility] office.  Call [phone] with questions     omeprazole 20 MG capsule  Commonly known as:  PRILOSEC  Take 20 mg by mouth daily.     oxyCODONE-acetaminophen 5-325 MG per tablet  Commonly known as:  PERCOCET/ROXICET  Take 1-2 tablets by mouth every 4 (four) hours as needed for moderate pain.     potassium chloride 10 MEQ tablet  Commonly known as:  K-DUR  Take 1 tablet (10  mEq total) by mouth daily.      promethazine-codeine 6.25-10 MG/5ML syrup  Commonly known as:  PHENERGAN with CODEINE  Take 5 mLs by mouth daily as needed for cough. Take 44ml by mouth every 6 hours as needed for cough     Vitamin B-12 1000 MCG Subl  Place 1 tablet (1,000 mcg total) under the tongue daily.     Vitamin D 1000 UNITS capsule  Take 1,000 Units by mouth every morning.           Follow-up Information    Follow up with Maia Petties., MD In 2 weeks.   Specialty:  General Surgery   Contact information:   [address] [place] [address] [phone]       Signed: Maia Petties. 12/06/2013, 8:13 AM

## 2013-12-06 NOTE — Progress Notes (Signed)
[name] to be D/C'd Home with Home Health per MD order.  Discussed with the patient and all questions fully answered.  VSS, Surgical site clean, dry. Wet to Dry dressings changed.  Staples removed and steri-strips applied per order.   IV catheter discontinued intact. Site without signs and symptoms of complications. Dressing and pressure applied.  An After Visit Summary was printed and given to the patient. Patient received prescription for pain medication. Other prescriptions called into patient's pharmacy.  D/c education completed with patient/family including follow up instructions, medication list, d/c activities limitations if indicated, with other d/c instructions as indicated by MD - patient able to verbalize understanding, all questions fully answered.   Patient instructed to return to ED, call 911, or call MD for any changes in condition.   Patient escorted via [place], and D/C home via private auto.  [name] D 12/06/2013 1:05 PM

## 2013-12-07 DIAGNOSIS — M793 Panniculitis, unspecified: Secondary | ICD-10-CM | POA: Diagnosis not present

## 2013-12-07 DIAGNOSIS — C185 Malignant neoplasm of splenic flexure: Secondary | ICD-10-CM | POA: Diagnosis not present

## 2013-12-07 DIAGNOSIS — Z433 Encounter for attention to colostomy: Secondary | ICD-10-CM | POA: Diagnosis not present

## 2013-12-07 DIAGNOSIS — Z483 Aftercare following surgery for neoplasm: Secondary | ICD-10-CM | POA: Diagnosis not present

## 2013-12-07 DIAGNOSIS — I10 Essential (primary) hypertension: Secondary | ICD-10-CM | POA: Diagnosis not present

## 2013-12-07 DIAGNOSIS — J45909 Unspecified asthma, uncomplicated: Secondary | ICD-10-CM | POA: Diagnosis not present

## 2013-12-08 DIAGNOSIS — Z433 Encounter for attention to colostomy: Secondary | ICD-10-CM | POA: Diagnosis not present

## 2013-12-08 DIAGNOSIS — J45909 Unspecified asthma, uncomplicated: Secondary | ICD-10-CM | POA: Diagnosis not present

## 2013-12-08 DIAGNOSIS — I10 Essential (primary) hypertension: Secondary | ICD-10-CM | POA: Diagnosis not present

## 2013-12-08 DIAGNOSIS — M793 Panniculitis, unspecified: Secondary | ICD-10-CM | POA: Diagnosis not present

## 2013-12-08 DIAGNOSIS — C185 Malignant neoplasm of splenic flexure: Secondary | ICD-10-CM | POA: Diagnosis not present

## 2013-12-08 DIAGNOSIS — Z483 Aftercare following surgery for neoplasm: Secondary | ICD-10-CM | POA: Diagnosis not present

## 2013-12-10 DIAGNOSIS — C185 Malignant neoplasm of splenic flexure: Secondary | ICD-10-CM | POA: Diagnosis not present

## 2013-12-10 DIAGNOSIS — Z433 Encounter for attention to colostomy: Secondary | ICD-10-CM | POA: Diagnosis not present

## 2013-12-10 DIAGNOSIS — Z483 Aftercare following surgery for neoplasm: Secondary | ICD-10-CM | POA: Diagnosis not present

## 2013-12-10 DIAGNOSIS — I10 Essential (primary) hypertension: Secondary | ICD-10-CM | POA: Diagnosis not present

## 2013-12-10 DIAGNOSIS — M793 Panniculitis, unspecified: Secondary | ICD-10-CM | POA: Diagnosis not present

## 2013-12-10 DIAGNOSIS — J45909 Unspecified asthma, uncomplicated: Secondary | ICD-10-CM | POA: Diagnosis not present

## 2013-12-14 DIAGNOSIS — J45909 Unspecified asthma, uncomplicated: Secondary | ICD-10-CM | POA: Diagnosis not present

## 2013-12-14 DIAGNOSIS — M793 Panniculitis, unspecified: Secondary | ICD-10-CM | POA: Diagnosis not present

## 2013-12-14 DIAGNOSIS — I10 Essential (primary) hypertension: Secondary | ICD-10-CM | POA: Diagnosis not present

## 2013-12-14 DIAGNOSIS — Z483 Aftercare following surgery for neoplasm: Secondary | ICD-10-CM | POA: Diagnosis not present

## 2013-12-14 DIAGNOSIS — C185 Malignant neoplasm of splenic flexure: Secondary | ICD-10-CM | POA: Diagnosis not present

## 2013-12-14 DIAGNOSIS — Z433 Encounter for attention to colostomy: Secondary | ICD-10-CM | POA: Diagnosis not present

## 2013-12-17 DIAGNOSIS — Z433 Encounter for attention to colostomy: Secondary | ICD-10-CM | POA: Diagnosis not present

## 2013-12-17 DIAGNOSIS — I10 Essential (primary) hypertension: Secondary | ICD-10-CM | POA: Diagnosis not present

## 2013-12-17 DIAGNOSIS — M793 Panniculitis, unspecified: Secondary | ICD-10-CM | POA: Diagnosis not present

## 2013-12-17 DIAGNOSIS — J45909 Unspecified asthma, uncomplicated: Secondary | ICD-10-CM | POA: Diagnosis not present

## 2013-12-17 DIAGNOSIS — C185 Malignant neoplasm of splenic flexure: Secondary | ICD-10-CM | POA: Diagnosis not present

## 2013-12-17 DIAGNOSIS — Z483 Aftercare following surgery for neoplasm: Secondary | ICD-10-CM | POA: Diagnosis not present

## 2013-12-20 DIAGNOSIS — I10 Essential (primary) hypertension: Secondary | ICD-10-CM | POA: Diagnosis not present

## 2013-12-20 DIAGNOSIS — C185 Malignant neoplasm of splenic flexure: Secondary | ICD-10-CM | POA: Diagnosis not present

## 2013-12-20 DIAGNOSIS — Z433 Encounter for attention to colostomy: Secondary | ICD-10-CM | POA: Diagnosis not present

## 2013-12-20 DIAGNOSIS — Z483 Aftercare following surgery for neoplasm: Secondary | ICD-10-CM | POA: Diagnosis not present

## 2013-12-20 DIAGNOSIS — J45909 Unspecified asthma, uncomplicated: Secondary | ICD-10-CM | POA: Diagnosis not present

## 2013-12-20 DIAGNOSIS — M793 Panniculitis, unspecified: Secondary | ICD-10-CM | POA: Diagnosis not present

## 2013-12-22 DIAGNOSIS — M793 Panniculitis, unspecified: Secondary | ICD-10-CM | POA: Diagnosis not present

## 2013-12-22 DIAGNOSIS — Z483 Aftercare following surgery for neoplasm: Secondary | ICD-10-CM | POA: Diagnosis not present

## 2013-12-22 DIAGNOSIS — Z433 Encounter for attention to colostomy: Secondary | ICD-10-CM | POA: Diagnosis not present

## 2013-12-22 DIAGNOSIS — I10 Essential (primary) hypertension: Secondary | ICD-10-CM | POA: Diagnosis not present

## 2013-12-22 DIAGNOSIS — C185 Malignant neoplasm of splenic flexure: Secondary | ICD-10-CM | POA: Diagnosis not present

## 2013-12-22 DIAGNOSIS — J45909 Unspecified asthma, uncomplicated: Secondary | ICD-10-CM | POA: Diagnosis not present

## 2013-12-28 DIAGNOSIS — Z433 Encounter for attention to colostomy: Secondary | ICD-10-CM | POA: Diagnosis not present

## 2013-12-28 DIAGNOSIS — Z483 Aftercare following surgery for neoplasm: Secondary | ICD-10-CM | POA: Diagnosis not present

## 2013-12-28 DIAGNOSIS — C185 Malignant neoplasm of splenic flexure: Secondary | ICD-10-CM | POA: Diagnosis not present

## 2013-12-28 DIAGNOSIS — J45909 Unspecified asthma, uncomplicated: Secondary | ICD-10-CM | POA: Diagnosis not present

## 2013-12-28 DIAGNOSIS — M793 Panniculitis, unspecified: Secondary | ICD-10-CM | POA: Diagnosis not present

## 2013-12-28 DIAGNOSIS — I10 Essential (primary) hypertension: Secondary | ICD-10-CM | POA: Diagnosis not present

## 2013-12-30 DIAGNOSIS — Z483 Aftercare following surgery for neoplasm: Secondary | ICD-10-CM | POA: Diagnosis not present

## 2013-12-30 DIAGNOSIS — C185 Malignant neoplasm of splenic flexure: Secondary | ICD-10-CM | POA: Diagnosis not present

## 2013-12-30 DIAGNOSIS — I10 Essential (primary) hypertension: Secondary | ICD-10-CM | POA: Diagnosis not present

## 2013-12-30 DIAGNOSIS — J45909 Unspecified asthma, uncomplicated: Secondary | ICD-10-CM | POA: Diagnosis not present

## 2013-12-30 DIAGNOSIS — M793 Panniculitis, unspecified: Secondary | ICD-10-CM | POA: Diagnosis not present

## 2013-12-30 DIAGNOSIS — Z433 Encounter for attention to colostomy: Secondary | ICD-10-CM | POA: Diagnosis not present

## 2013-12-31 ENCOUNTER — Ambulatory Visit (INDEPENDENT_AMBULATORY_CARE_PROVIDER_SITE_OTHER): Payer: Medicare Other

## 2013-12-31 DIAGNOSIS — J309 Allergic rhinitis, unspecified: Secondary | ICD-10-CM | POA: Diagnosis not present

## 2014-01-04 ENCOUNTER — Telehealth: Payer: Self-pay | Admitting: Internal Medicine

## 2014-01-04 DIAGNOSIS — Z483 Aftercare following surgery for neoplasm: Secondary | ICD-10-CM | POA: Diagnosis not present

## 2014-01-04 DIAGNOSIS — C185 Malignant neoplasm of splenic flexure: Secondary | ICD-10-CM | POA: Diagnosis not present

## 2014-01-04 DIAGNOSIS — J45909 Unspecified asthma, uncomplicated: Secondary | ICD-10-CM | POA: Diagnosis not present

## 2014-01-04 DIAGNOSIS — I10 Essential (primary) hypertension: Secondary | ICD-10-CM | POA: Diagnosis not present

## 2014-01-04 DIAGNOSIS — Z433 Encounter for attention to colostomy: Secondary | ICD-10-CM | POA: Diagnosis not present

## 2014-01-04 DIAGNOSIS — M793 Panniculitis, unspecified: Secondary | ICD-10-CM | POA: Diagnosis not present

## 2014-01-04 MED ORDER — VALSARTAN-HYDROCHLOROTHIAZIDE 160-25 MG PO TABS: 1.0000 | ORAL_TABLET | Freq: Every day | ORAL | Status: DC

## 2014-01-04 NOTE — Telephone Encounter (Signed)
MD sent rx to wrong pharmacy [facility].../lmb

## 2014-01-04 NOTE — Telephone Encounter (Signed)
Pt called back in and said that she is going to need a refill on the Diovan HCT in order to restart it.  She wants [place] to send it to pharmacy on file.

## 2014-01-04 NOTE — Telephone Encounter (Signed)
[name] called from [facility] stated that she is at [name] house taking care of her wound today. [name] Bp when she lay down was 144/98 and when she sit up was 190/110. Pt was taken off of her BP med because she was on lasix since march of this year. Pt is concern about her blood pressure and they were wondering what Dr. Camila Li want to do about this? Please call pt, the nurse will be back to check on her again Friday.

## 2014-01-04 NOTE — Telephone Encounter (Signed)
Called pt gave her md response...[name]

## 2014-01-04 NOTE — Telephone Encounter (Signed)
Re-start Diovan HCT Stop Lasix or use it prn swelling Thx

## 2014-01-05 ENCOUNTER — Ambulatory Visit (INDEPENDENT_AMBULATORY_CARE_PROVIDER_SITE_OTHER): Payer: Medicare Other

## 2014-01-05 DIAGNOSIS — J309 Allergic rhinitis, unspecified: Secondary | ICD-10-CM | POA: Diagnosis not present

## 2014-01-07 ENCOUNTER — Telehealth: Payer: Self-pay | Admitting: Internal Medicine

## 2014-01-07 DIAGNOSIS — M793 Panniculitis, unspecified: Secondary | ICD-10-CM | POA: Diagnosis not present

## 2014-01-07 DIAGNOSIS — C185 Malignant neoplasm of splenic flexure: Secondary | ICD-10-CM | POA: Diagnosis not present

## 2014-01-07 DIAGNOSIS — I10 Essential (primary) hypertension: Secondary | ICD-10-CM | POA: Diagnosis not present

## 2014-01-07 DIAGNOSIS — J45909 Unspecified asthma, uncomplicated: Secondary | ICD-10-CM | POA: Diagnosis not present

## 2014-01-07 DIAGNOSIS — Z433 Encounter for attention to colostomy: Secondary | ICD-10-CM | POA: Diagnosis not present

## 2014-01-07 DIAGNOSIS — Z483 Aftercare following surgery for neoplasm: Secondary | ICD-10-CM | POA: Diagnosis not present

## 2014-01-07 MED ORDER — VALSARTAN-HYDROCHLOROTHIAZIDE 160-25 MG PO TABS: 1.0000 | ORAL_TABLET | Freq: Every day | ORAL | Status: DC

## 2014-01-07 NOTE — Telephone Encounter (Signed)
Pt called stated that valsartan-hydrochlorothiazide need to be 90 day supply. Please change it.

## 2014-01-07 NOTE — Telephone Encounter (Signed)
Done

## 2014-01-09 ENCOUNTER — Other Ambulatory Visit: Payer: Self-pay | Admitting: Internal Medicine

## 2014-01-12 ENCOUNTER — Ambulatory Visit: Payer: Medicare Other

## 2014-01-12 DIAGNOSIS — Z433 Encounter for attention to colostomy: Secondary | ICD-10-CM | POA: Diagnosis not present

## 2014-01-12 DIAGNOSIS — J45909 Unspecified asthma, uncomplicated: Secondary | ICD-10-CM | POA: Diagnosis not present

## 2014-01-12 DIAGNOSIS — Z483 Aftercare following surgery for neoplasm: Secondary | ICD-10-CM | POA: Diagnosis not present

## 2014-01-12 DIAGNOSIS — C185 Malignant neoplasm of splenic flexure: Secondary | ICD-10-CM | POA: Diagnosis not present

## 2014-01-12 DIAGNOSIS — M793 Panniculitis, unspecified: Secondary | ICD-10-CM | POA: Diagnosis not present

## 2014-01-12 DIAGNOSIS — I10 Essential (primary) hypertension: Secondary | ICD-10-CM | POA: Diagnosis not present

## 2014-01-18 ENCOUNTER — Ambulatory Visit (INDEPENDENT_AMBULATORY_CARE_PROVIDER_SITE_OTHER): Payer: Medicare Other

## 2014-01-18 DIAGNOSIS — J309 Allergic rhinitis, unspecified: Secondary | ICD-10-CM

## 2014-01-26 ENCOUNTER — Ambulatory Visit (INDEPENDENT_AMBULATORY_CARE_PROVIDER_SITE_OTHER): Payer: Medicare Other

## 2014-01-26 DIAGNOSIS — J309 Allergic rhinitis, unspecified: Secondary | ICD-10-CM

## 2014-01-27 ENCOUNTER — Ambulatory Visit: Payer: Medicare Other

## 2014-02-01 ENCOUNTER — Other Ambulatory Visit (HOSPITAL_BASED_OUTPATIENT_CLINIC_OR_DEPARTMENT_OTHER): Payer: Medicare Other

## 2014-02-01 ENCOUNTER — Ambulatory Visit (HOSPITAL_COMMUNITY)
Admission: RE | Admit: 2014-02-01 | Discharge: 2014-02-01 | Disposition: A | Discharge status: Home / Self Care | Payer: Medicare Other | Source: Ambulatory Visit | Attending: Nurse Practitioner | Admitting: Nurse Practitioner

## 2014-02-01 ENCOUNTER — Ambulatory Visit: Payer: Medicare Other

## 2014-02-01 ENCOUNTER — Telehealth: Payer: Self-pay | Admitting: *Deleted

## 2014-02-01 ENCOUNTER — Ambulatory Visit (INDEPENDENT_AMBULATORY_CARE_PROVIDER_SITE_OTHER): Payer: Medicare Other

## 2014-02-01 DIAGNOSIS — J309 Allergic rhinitis, unspecified: Secondary | ICD-10-CM | POA: Diagnosis not present

## 2014-02-01 DIAGNOSIS — J45909 Unspecified asthma, uncomplicated: Secondary | ICD-10-CM | POA: Diagnosis not present

## 2014-02-01 DIAGNOSIS — C185 Malignant neoplasm of splenic flexure: Secondary | ICD-10-CM

## 2014-02-01 DIAGNOSIS — D510 Vitamin B12 deficiency anemia due to intrinsic factor deficiency: Secondary | ICD-10-CM | POA: Diagnosis not present

## 2014-02-01 DIAGNOSIS — C184 Malignant neoplasm of transverse colon: Secondary | ICD-10-CM

## 2014-02-01 DIAGNOSIS — Z85038 Personal history of other malignant neoplasm of large intestine: Secondary | ICD-10-CM | POA: Diagnosis not present

## 2014-02-01 DIAGNOSIS — Z9221 Personal history of antineoplastic chemotherapy: Secondary | ICD-10-CM | POA: Diagnosis not present

## 2014-02-01 DIAGNOSIS — Z9889 Other specified postprocedural states: Secondary | ICD-10-CM | POA: Diagnosis not present

## 2014-02-01 LAB — CBC WITH DIFFERENTIAL/PLATELET
BASO%: 0.6 % (ref 0.0–2.0)
BASOS ABS: 0 10*3/uL (ref 0.0–0.1)
EOS%: 1.6 % (ref 0.0–7.0)
Eosinophils Absolute: 0.1 10*3/uL (ref 0.0–0.5)
HCT: 32 % — ABNORMAL LOW (ref 34.8–46.6)
HGB: 9.9 g/dL — ABNORMAL LOW (ref 11.6–15.9)
LYMPH%: 22.3 % (ref 14.0–49.7)
MCH: 22.8 pg — ABNORMAL LOW (ref 25.1–34.0)
MCHC: 30.9 g/dL — ABNORMAL LOW (ref 31.5–36.0)
MCV: 73.7 fL — ABNORMAL LOW (ref 79.5–101.0)
MONO#: 0.6 10*3/uL (ref 0.1–0.9)
MONO%: 9.8 % (ref 0.0–14.0)
NEUT#: 4.3 10*3/uL (ref 1.5–6.5)
NEUT%: 65.7 % (ref 38.4–76.8)
Platelets: 326 10*3/uL (ref 145–400)
RBC: 4.34 10*6/uL (ref 3.70–5.45)
RDW: 16.5 % — AB (ref 11.2–14.5)
WBC: 6.6 10*3/uL (ref 3.9–10.3)
lymph#: 1.5 10*3/uL (ref 0.9–3.3)

## 2014-02-01 LAB — COMPREHENSIVE METABOLIC PANEL (CC13)
ALT: 12 U/L (ref 0–55)
AST: 13 U/L (ref 5–34)
Albumin: 3.1 g/dL — ABNORMAL LOW (ref 3.5–5.0)
Alkaline Phosphatase: 139 U/L (ref 40–150)
Anion Gap: 11 mEq/L (ref 3–11)
BUN: 14.8 mg/dL (ref 7.0–26.0)
CALCIUM: 9.5 mg/dL (ref 8.4–10.4)
CO2: 29 meq/L (ref 22–29)
Chloride: 103 mEq/L (ref 98–109)
Creatinine: 0.8 mg/dL (ref 0.6–1.1)
EGFR: 86 mL/min/{1.73_m2} — ABNORMAL LOW (ref >90)
Glucose: 106 mg/dl (ref 70–140)
Potassium: 3.6 mEq/L (ref 3.5–5.1)
SODIUM: 142 meq/L (ref 136–145)
TOTAL PROTEIN: 7.1 g/dL (ref 6.4–8.3)
Total Bilirubin: 0.38 mg/dL (ref 0.20–1.20)

## 2014-02-01 MED ORDER — IOHEXOL 300 MG/ML  SOLN
100.0000 mL | Freq: Once | INTRAMUSCULAR | Status: AC | PRN
  Administered 2014-02-01: 100 mL via INTRAVENOUS

## 2014-02-01 NOTE — Telephone Encounter (Signed)
THE RESULTS WERE CALLED AND FAXED. THE REPORT WAS GIVEN TO LISA THOMAS,NP.

## 2014-02-02 LAB — CEA: CEA: 0.6 ng/mL (ref 0.0–5.0)

## 2014-02-03 ENCOUNTER — Ambulatory Visit (INDEPENDENT_AMBULATORY_CARE_PROVIDER_SITE_OTHER): Payer: Medicare Other

## 2014-02-03 ENCOUNTER — Telehealth: Payer: Self-pay | Admitting: Nurse Practitioner

## 2014-02-03 ENCOUNTER — Ambulatory Visit (HOSPITAL_BASED_OUTPATIENT_CLINIC_OR_DEPARTMENT_OTHER): Payer: Medicare Other | Admitting: Nurse Practitioner

## 2014-02-03 ENCOUNTER — Other Ambulatory Visit: Payer: Self-pay | Admitting: Nurse Practitioner

## 2014-02-03 VITALS — BP 116/59 | HR 64 | Temp 98.4°F | Resp 18 | Ht 62.0 in | Wt 222.1 lb

## 2014-02-03 DIAGNOSIS — Z85038 Personal history of other malignant neoplasm of large intestine: Secondary | ICD-10-CM

## 2014-02-03 DIAGNOSIS — L271 Localized skin eruption due to drugs and medicaments taken internally: Secondary | ICD-10-CM

## 2014-02-03 DIAGNOSIS — C184 Malignant neoplasm of transverse colon: Secondary | ICD-10-CM

## 2014-02-03 DIAGNOSIS — J309 Allergic rhinitis, unspecified: Secondary | ICD-10-CM

## 2014-02-03 DIAGNOSIS — D509 Iron deficiency anemia, unspecified: Secondary | ICD-10-CM | POA: Diagnosis not present

## 2014-02-03 LAB — FERRITIN: FERRITIN: 76 ng/mL (ref 10–291)

## 2014-02-03 NOTE — Progress Notes (Addendum)
[name]   Diagnosis:  Colon cancer  INTERVAL HISTORY:   [name] returns as scheduled. She underwent ostomy reversal 11/25/2013. She reports bowels are moving regularly. No bloody or black stools. She has mild intermittent abdominal pain which she attributes to the recent surgery. The pain occurs around the midline abdominal scar. The pain does not occur on a daily basis. Last week she experienced nausea/vomiting for 2 days. She reports a good appetite and that her weight is stable. No fevers or sweats.  Objective:  Vital signs in last 24 hours:  Blood pressure 116/59, pulse 64, temperature 98.4 F (36.9 C), temperature source Oral, resp. rate 18, height $RemoveBe'5\' 2"'pSEvqQdfA$  (1.575 m), weight 222 lb 1.6 oz (100.744 kg), SpO2 99 %. 09/28/2013 weight 233 pounds    HEENT: No thrush or ulcers. Lymphatics: No palpable cervical, supraclavicular, axillary or inguinal lymph nodes. Resp: Lungs clear bilaterally. Cardio: Regular rate and rhythm. GI: Abdomen is soft. Nontender. No mass. No organomegaly. Vascular: No leg edema. Calves soft and nontender.  Lab Results:  Lab Results  Component Value Date   WBC 6.6 02/01/2014   HGB 9.9* 02/01/2014   HCT 32.0* 02/01/2014   MCV 73.7* 02/01/2014   PLT 326 02/01/2014   NEUTROABS 4.3 02/01/2014   CEA 0.6  Imaging:  Ct Chest W Contrast  02/01/2014   CLINICAL DATA:  Subsequent evaluation of 64 year old female with history of colon cancer diagnosed in January 2015 status post colonic resection in October 2015 and chemotherapy (completed in September 2015).  EXAM: CT CHEST, ABDOMEN, AND PELVIS WITH CONTRAST  TECHNIQUE: Multidetector CT imaging of the chest, abdomen and pelvis was performed following the standard protocol during bolus administration of intravenous contrast.  CONTRAST:  138mL OMNIPAQUE IOHEXOL 300 MG/ML  SOLN  COMPARISON:  CT of the abdomen and pelvis 02/02/2013.  FINDINGS: CT CHEST FINDINGS   Mediastinum/Lymph Nodes: Heart size is mildly enlarged. There is no significant pericardial fluid, thickening or pericardial calcification. No pathologically enlarged mediastinal or hilar lymph nodes. Esophagus is unremarkable in appearance.  Lungs/Pleura: No suspicious appearing pulmonary nodules or masses. No acute consolidative airspace disease. No pleural effusions.  Musculoskeletal/Soft Tissues: There are no aggressive appearing lytic or blastic lesions noted in the visualized portions of the skeleton.  CT ABDOMEN AND PELVIS FINDINGS  Hepatobiliary: Status post cholecystectomy. No cystic or solid hepatic lesions. No intra or extrahepatic biliary ductal dilatation.  Pancreas: Unremarkable.  Spleen: Unremarkable.  Adrenals/Urinary Tract: Bilateral adrenal glands and bilateral kidneys are normal in appearance. No hydroureteronephrosis. Urinary bladder is nearly decompressed, but otherwise unremarkable in appearance.  Stomach/Bowel: Normal appearance of the stomach. Compared to the prior study, there has been interval left hemicolectomy. In the pelvis there is an anastomosis between the mid transverse colon and the sigmoid colon. There is evidence of prior right lower quadrant ileostomy, which has been taken down. Notably, however, there appears to be a small hernia at the prior ileostomy site, best appreciated on images 74 and 75 of series 2, where a loop of distal small bowel (likely ileum) extends into the anterior abdominal wall musculature. Importantly, today's study demonstrates a new mildly dilated loop of mid small bowel (likely jejunum) that is markedly thickened (wall measures up to 1.8 cm in thickness), surrounded by haziness in the adjacent mesentery with multiple prominent but nonenlarged mesenteric lymph nodes. 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 The other portions of the small bowel are  otherwise unremarkable.  Vascular/Lymphatic: Minimal atherosclerosis is noted in the abdominal and pelvic vasculature, without evidence of aneurysm or dissection. No lymphadenopathy noted in the abdomen or pelvis.  Reproductive: Status post hysterectomy. Ovaries are not confidently identified and may be surgically absent or atrophic.  Other: No significant volume of ascites. No pneumoperitoneum. Small ventral hernia in the epigastric region containing a portion of the stomach.  Musculoskeletal: There are no aggressive appearing lytic or blastic lesions noted in the visualized portions of the skeleton.  IMPRESSION: 1. Status post left hemicolectomy. No findings to suggest local recurrence of disease or metastatic disease in the chest, abdomen or pelvis. 2. However, there is a new area of mass like thickening and dilatation of the mid small bowel, likely jejunum. This is best appreciated on axial image 61 of series 2, and image 24 of series 5 with the bowel wall measures up to 1.8 cm in thickness. This appearance is most suspicious for potential new small bowel lymphoma, and is less likely favored to represent a focal area of enteritis. 3. Evidence of prior right lower quadrant ileostomy. There is a small amount of herniation of ileum into the anterior abdominal wall musculature at the site of ileostomy take-down. No evidence of associated bowel incarceration or obstruction at this time. 4. Additional incidental findings, as above. These results will be called to the ordering clinician or representative by the Radiologist Assistant, and communication documented in the PACS or zVision Dashboard.   Electronically Signed   By: Vinnie Langton M.D.   On: 02/01/2014 14:55   Ct Abdomen Pelvis W Contrast  02/01/2014   CLINICAL DATA:  Subsequent evaluation of 64 year old female with history of colon cancer diagnosed in January 2015 status post colonic resection in October 2015 and chemotherapy (completed in September  2015).  EXAM: CT CHEST, ABDOMEN, AND PELVIS WITH CONTRAST  TECHNIQUE: Multidetector CT imaging of the chest, abdomen and pelvis was performed following the standard protocol during bolus administration of intravenous contrast.  CONTRAST:  12mL OMNIPAQUE IOHEXOL 300 MG/ML  SOLN  COMPARISON:  CT of the abdomen and pelvis 02/02/2013.  FINDINGS: CT CHEST FINDINGS  Mediastinum/Lymph Nodes: Heart size is mildly enlarged. There is no significant pericardial fluid, thickening or pericardial calcification. No pathologically enlarged mediastinal or hilar lymph nodes. Esophagus is unremarkable in appearance.  Lungs/Pleura: No suspicious appearing pulmonary nodules or masses. No acute consolidative airspace disease. No pleural effusions.  Musculoskeletal/Soft Tissues: There are no aggressive appearing lytic or blastic lesions noted in the visualized portions of the skeleton.  CT ABDOMEN AND PELVIS FINDINGS  Hepatobiliary: Status post cholecystectomy. No cystic or solid hepatic lesions. No intra or extrahepatic biliary ductal dilatation.  Pancreas: Unremarkable.  Spleen: Unremarkable.  Adrenals/Urinary Tract: Bilateral adrenal glands and bilateral kidneys are normal in appearance. No hydroureteronephrosis. Urinary bladder is nearly decompressed, but otherwise unremarkable in appearance.  Stomach/Bowel: Normal appearance of the stomach. Compared to the prior study, there has been interval left hemicolectomy. In the pelvis there is an anastomosis between the mid transverse colon and the sigmoid colon. There is evidence of prior right lower quadrant ileostomy, which has been taken down. Notably, however, there appears to be a small hernia at the prior ileostomy site, best appreciated on images 74 and 75 of series 2, where a loop of distal small bowel (likely ileum) extends into the anterior abdominal wall musculature. Importantly, today's study demonstrates a new mildly dilated loop  of mid small bowel (likely jejunum) that is  markedly thickened (wall measures up to 1.8 cm in thickness), surrounded by haziness in the adjacent mesentery with multiple prominent but nonenlarged mesenteric lymph nodes. 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 The other portions of the small bowel are otherwise unremarkable.  Vascular/Lymphatic: Minimal atherosclerosis is noted in the abdominal and pelvic vasculature, without evidence of aneurysm or dissection. No lymphadenopathy noted in the abdomen or pelvis.  Reproductive: Status post hysterectomy. Ovaries are not confidently identified and may be surgically absent or atrophic.  Other: No significant volume of ascites. No pneumoperitoneum. Small ventral hernia in the epigastric region containing a portion of the stomach.  Musculoskeletal: There are no aggressive appearing lytic or blastic lesions noted in the visualized portions of the skeleton.  IMPRESSION: 1. Status post left hemicolectomy. No findings to suggest local recurrence of disease or metastatic disease in the chest, abdomen or pelvis. 2. However, there is a new area of mass like thickening and dilatation of the mid small bowel, likely jejunum. This is best appreciated on axial image 61 of series 2, and image 24 of series 5 with the bowel wall measures up to 1.8 cm in thickness. This appearance is most suspicious for potential new small bowel lymphoma, and is less likely favored to represent a focal area of enteritis. 3. Evidence of prior right lower quadrant ileostomy. There is a small amount of herniation of ileum into the anterior abdominal wall musculature at the site of ileostomy take-down. No evidence of associated bowel incarceration or obstruction at this time. 4. Additional incidental findings, as above. These results will be called to the ordering clinician or representative by the Radiologist Assistant, and communication  documented in the PACS or zVision Dashboard.   Electronically Signed   By: Vinnie Langton M.D.   On: 02/01/2014 14:55    Medications: I have reviewed the patient's current medications.  Assessment/Plan: 1. 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   Presentation to the emergency room 02/02/2013 with a colonic obstruction secondary to tumor at the splenic flexure.   Baseline CEA on 02/02/2013 less than 0.5.   Initiation of adjuvant Xeloda 04/10/2013.   Cycle 2 adjuvant Xeloda 05/01/2013.   Cycle 3 adjuvant Xeloda 05/22/2013.   Cycle 4 adjuvant Xeloda 06/12/2013.   Cycle 5 adjuvant Xeloda 07/03/2013 (Xeloda dose reduced due to hand foot syndrome).   Cycle 6 adjuvant Xeloda 07/24/2013.   Cycle 7 adjuvant Xeloda 08/14/2013   Cycle 8 adjuvant Xeloda 09/07/2013.  2. History of iron deficiency anemia. Recurrent anemia 02/01/2014. 3. Asthma. 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 5. Status post ostomy reversal 11/25/2013.   Disposition: [name] presents today for routine follow-up. The restaging CT evaluation done 02/01/2014 shows a new area of masslike thickening and dilatation of the mid small bowel, likely jejunum. [name] reviewed the CT report/images with [name]. She has lost some weight and has a recurrent microcytic anemia, likely iron deficiency, related to bleeding from the small bowel.  She will complete a set of stool cards. We will add a ferritin to the labs from 02/01/2014. She will increase oral iron to 3 times daily. Dr.  Benay Spice will present her case at the upcoming GI tumor conference. She will most likely need a biopsy.  We scheduled a return visit in 2 weeks. She will contact the  office in the interim with any problems.  Patient seen with [name]. 30 minutes were spent face-to-face at today's visit with the majority of that time involved in counseling/coordination of care.    Ned Card ANP/GNP-BC   02/03/2014  12:40 PM  This was a shared visit with Ned Card. [name] was interviewed and examined. I reviewed the CT images with her. She appears to have masslike thickening and an area of small bowel. She has developed iron deficiency anemia.  I will review the case with Dr.Tsuei and present her case at the GI tumor conference next week. We will discuss biopsy options. I doubt this lesion can be approached endoscopically.  [name] will return for an office visit in 2 weeks.  Julieanne Manson, M.D.

## 2014-02-03 NOTE — Telephone Encounter (Signed)
Gave avs & cal for Jan. °

## 2014-02-07 ENCOUNTER — Encounter: Payer: Self-pay | Admitting: Internal Medicine

## 2014-02-07 ENCOUNTER — Ambulatory Visit: Payer: Medicare Other | Admitting: Internal Medicine

## 2014-02-08 ENCOUNTER — Ambulatory Visit (INDEPENDENT_AMBULATORY_CARE_PROVIDER_SITE_OTHER): Payer: Medicare Other

## 2014-02-08 ENCOUNTER — Other Ambulatory Visit: Payer: Self-pay | Admitting: *Deleted

## 2014-02-08 ENCOUNTER — Ambulatory Visit (HOSPITAL_BASED_OUTPATIENT_CLINIC_OR_DEPARTMENT_OTHER): Payer: Medicare Other

## 2014-02-08 DIAGNOSIS — C184 Malignant neoplasm of transverse colon: Secondary | ICD-10-CM

## 2014-02-08 DIAGNOSIS — C185 Malignant neoplasm of splenic flexure: Secondary | ICD-10-CM | POA: Diagnosis not present

## 2014-02-08 DIAGNOSIS — J309 Allergic rhinitis, unspecified: Secondary | ICD-10-CM | POA: Diagnosis not present

## 2014-02-08 LAB — FECAL OCCULT BLOOD, GUAIAC: Occult Blood: POSITIVE

## 2014-02-11 ENCOUNTER — Telehealth: Payer: Self-pay

## 2014-02-11 NOTE — Telephone Encounter (Signed)
Referral sent to [facility] Left message for patient to call back

## 2014-02-11 NOTE — Telephone Encounter (Signed)
Patient notified.  Records and referral faxed to [phone]

## 2014-02-11 NOTE — Telephone Encounter (Signed)
-----   Message from Gatha Mayer, MD sent at 02/11/2014 10:21 AM EST ----- Regarding: FW: small bowel mass Prakriti Carignan,  Please contact this lady and explain what I wrote below - has small bowel lesion - we do not have ability to scope it here  Please see if Dr. Olegario Messier can see her within a couple of weeks re: small bowel mass - mid small bowel on CT, ? Lymphoma, recent surgery and chemo for colon caancer  Let me know   Images of CT will need to go to him also ----- Message -----    From: Ladell Pier, MD    Sent: 02/11/2014   7:52 AM      To: Owens Shark, NP, Gatha Mayer, MD Subject: RE: small bowel mass                           I appreciate you making the referral to [facility]. Next option will be surgery  Thanks,  Brad ----- Message -----    From: Gatha Mayer, MD    Sent: 02/10/2014   6:24 PM      To: Ladell Pier, MD Subject: small bowel mass                               Marlou Porch updated me about Ms. Pumphrey and the small bowel lesion.  It looks too far down for our enteroscope to reach.  I think we should refer her to [facility] or another place that does double balloon enteroscopy to try to reach this if no other options here - I guess if it is a lymphoma then chemo and not surgery would be Tx so we are trying to avoid more surgery (for bx)  I am happy to facilitate the referral  Glendell Docker

## 2014-02-15 ENCOUNTER — Telehealth: Payer: Self-pay | Admitting: Internal Medicine

## 2014-02-15 NOTE — Telephone Encounter (Signed)
I spoke to Dr. Arsenio Loader re: possible double balloon enteroscopy. He wants to see the images and said he will work on scheduling her.  We need to get a disc of CT Fedexed over to him at [facility] so he can review and then decide next step.  He is aware we faxed referral info

## 2014-02-15 NOTE — Telephone Encounter (Signed)
I have arranged for [name] at [facility] to Fed Ex to Dr. Arsenio Loader at [place]. [place] [address].  I notified Ms. Hagey that she does not need to pick up disk as previously instructed.

## 2014-02-16 ENCOUNTER — Telehealth: Payer: Self-pay | Admitting: Oncology

## 2014-02-16 NOTE — Telephone Encounter (Signed)
Pt called wanted to cancel Friday I advised her that she could wait till 01/21 to r/s and see how the weather is.... KJ

## 2014-02-17 ENCOUNTER — Telehealth: Payer: Self-pay

## 2014-02-17 NOTE — Telephone Encounter (Signed)
Called to see if pt felt she was going to make it in tomorrow. Pt would like tor reschedule d/t weather. Cancelled appt for 02/18/14. POF sent for r/s.

## 2014-02-18 ENCOUNTER — Ambulatory Visit: Payer: Medicare Other

## 2014-02-18 ENCOUNTER — Ambulatory Visit: Payer: Medicare Other | Admitting: Nurse Practitioner

## 2014-02-18 ENCOUNTER — Other Ambulatory Visit: Payer: Medicare Other

## 2014-02-18 ENCOUNTER — Telehealth: Payer: Self-pay | Admitting: Nurse Practitioner

## 2014-02-18 NOTE — Telephone Encounter (Signed)
Pt called lft msg, called pt back and confirmed labs/ov due to weather.... KJ

## 2014-02-21 ENCOUNTER — Other Ambulatory Visit: Payer: Self-pay | Admitting: Nurse Practitioner

## 2014-02-21 ENCOUNTER — Telehealth: Payer: Self-pay | Admitting: Nurse Practitioner

## 2014-02-21 NOTE — Telephone Encounter (Signed)
S/w pt confirming labs/ov per 01/25 POF..... KJ

## 2014-02-23 ENCOUNTER — Ambulatory Visit: Payer: Medicare Other | Admitting: Nurse Practitioner

## 2014-02-23 ENCOUNTER — Other Ambulatory Visit: Payer: Medicare Other

## 2014-02-24 ENCOUNTER — Telehealth: Payer: Self-pay

## 2014-02-24 NOTE — Telephone Encounter (Signed)
Pt called to inform Ned Card, NP [facility] got in touch with her and she has an appt with them March 25,2016 at 930. Informed Ned Card and [name].

## 2014-02-28 ENCOUNTER — Telehealth: Payer: Self-pay | Admitting: Internal Medicine

## 2014-02-28 NOTE — Telephone Encounter (Signed)
[name] had told me she does not have an appointment until March with Dr. Arsenio Loader - to see about balloon enteroscopy.  That sounds like too long of an interval to wait given her clinical scenario.  1) We need to call Dr. Lissa Morales office to see if the could possibly see her this month instead.  2) I will also speak with Dr. Georgette Dover to see if surgical biopsy feasible - may be most expeditious way to handle if medically acceptable. Were trying for less invasive approach but may need to switch.  I have explained to [name].

## 2014-02-28 NOTE — Telephone Encounter (Signed)
I also spoke to Dr. Georgette Dover and he wants to aboid laparotomy (would be needed) if at all possible as patient has spent longer than usual recovering from prior surgeries.  Will forward this to [name] for now. ? If there is imaging that would aid further while we wait for exam.  ? If [facility] or [facility] are possibilities though I am not familiar with operators of double balloon enteroscopy at those institutions.

## 2014-02-28 NOTE — Telephone Encounter (Signed)
I spoke with [facility] and their endoscopy center.  She is currently scheduled for 04/22/14 10:30.  They say there is no opening at all to do the procedure any earlier.  Dr. Roney Mans is the only other MD that performs the procedure and his first opening would not be to March 11.

## 2014-03-01 ENCOUNTER — Telehealth: Payer: Self-pay | Admitting: *Deleted

## 2014-03-01 ENCOUNTER — Telehealth: Payer: Self-pay | Admitting: Oncology

## 2014-03-01 ENCOUNTER — Other Ambulatory Visit: Payer: Self-pay | Admitting: Nurse Practitioner

## 2014-03-01 DIAGNOSIS — C184 Malignant neoplasm of transverse colon: Secondary | ICD-10-CM

## 2014-03-01 NOTE — Telephone Encounter (Signed)
Called pt, [name] to see pt tomorrow 03/02/14 at 930, labs at 915. Pt verbalized understanding and states she will do her best to get here. POF sent to scheduling to book pt in MD spot at 930 and labs 915, and CBC ordered.

## 2014-03-01 NOTE — Telephone Encounter (Signed)
VERBAL ORDER AND READ BACK TO DR.Edmonson. DOES NOT NEED TO KEEP 03/04/14 APPOINTMENT. SHE IS CONTINUE TAKING IRON ONE TABLET THREE TIMES A DAY. PT. WILL HAVE A LAB APPOINTMENT ON 03/08/14. SHE IS TO KEEP APPOINTMENT ON 04/22/14 AT [facility]. PT. WILL HAVE AN APPOINTMENT FOR LAB AND SEE DR.Damascus ON 04/26/14. A SCHEDULER WILL CALL PT. WITH TIMES. IF PT. SHOULD HAVE NEW SYMPTOMS OR ABDOMINAL PAIN SHE IS TO CALL DR.SHERRILL. NOTIFIED PT. OF THE ABOVE INSTRUCTIONS. PT. HAS BEEN HAVING INTERMITTED ABDOMINAL PAIN AT A SCALE OF SIX SINCE 02/21/14. SHE HAS BEEN TAKING PERCOCET WHICH WAS GIVEN TO HER BY DR.TSUEI. BUT SHE HAS ONLY TWO TABLETS LEFT.

## 2014-03-01 NOTE — Telephone Encounter (Signed)
She is now having increased abdominal pain requiring narcotics. I will see her tomorrow and try to facilitate a referral to [facility] or [facility].

## 2014-03-01 NOTE — Telephone Encounter (Signed)
Left message to confirm appointment for 03/29.

## 2014-03-02 ENCOUNTER — Telehealth: Payer: Self-pay | Admitting: Oncology

## 2014-03-02 ENCOUNTER — Ambulatory Visit (HOSPITAL_BASED_OUTPATIENT_CLINIC_OR_DEPARTMENT_OTHER): Payer: Medicare Other | Admitting: Oncology

## 2014-03-02 ENCOUNTER — Other Ambulatory Visit (HOSPITAL_BASED_OUTPATIENT_CLINIC_OR_DEPARTMENT_OTHER): Payer: Medicare Other

## 2014-03-02 ENCOUNTER — Other Ambulatory Visit: Payer: Self-pay | Admitting: *Deleted

## 2014-03-02 ENCOUNTER — Ambulatory Visit (INDEPENDENT_AMBULATORY_CARE_PROVIDER_SITE_OTHER): Payer: Medicare Other

## 2014-03-02 VITALS — BP 113/69 | HR 92 | Temp 98.6°F | Resp 18 | Ht 62.0 in | Wt 219.4 lb

## 2014-03-02 DIAGNOSIS — R109 Unspecified abdominal pain: Secondary | ICD-10-CM

## 2014-03-02 DIAGNOSIS — C184 Malignant neoplasm of transverse colon: Secondary | ICD-10-CM

## 2014-03-02 DIAGNOSIS — J309 Allergic rhinitis, unspecified: Secondary | ICD-10-CM | POA: Diagnosis not present

## 2014-03-02 DIAGNOSIS — C185 Malignant neoplasm of splenic flexure: Secondary | ICD-10-CM

## 2014-03-02 DIAGNOSIS — D649 Anemia, unspecified: Secondary | ICD-10-CM

## 2014-03-02 LAB — CBC WITH DIFFERENTIAL/PLATELET
BASO%: 0.5 % (ref 0.0–2.0)
Basophils Absolute: 0 10*3/uL (ref 0.0–0.1)
EOS%: 2.1 % (ref 0.0–7.0)
Eosinophils Absolute: 0.2 10*3/uL (ref 0.0–0.5)
HEMATOCRIT: 32.2 % — AB (ref 34.8–46.6)
HEMOGLOBIN: 10.2 g/dL — AB (ref 11.6–15.9)
LYMPH#: 1.9 10*3/uL (ref 0.9–3.3)
LYMPH%: 24.6 % (ref 14.0–49.7)
MCH: 23 pg — ABNORMAL LOW (ref 25.1–34.0)
MCHC: 31.7 g/dL (ref 31.5–36.0)
MCV: 72.5 fL — ABNORMAL LOW (ref 79.5–101.0)
MONO#: 0.8 10*3/uL (ref 0.1–0.9)
MONO%: 10 % (ref 0.0–14.0)
NEUT#: 4.9 10*3/uL (ref 1.5–6.5)
NEUT%: 62.8 % (ref 38.4–76.8)
PLATELETS: 344 10*3/uL (ref 145–400)
RBC: 4.44 10*6/uL (ref 3.70–5.45)
RDW: 16.3 % — ABNORMAL HIGH (ref 11.2–14.5)
WBC: 7.7 10*3/uL (ref 3.9–10.3)

## 2014-03-02 MED ORDER — OXYCODONE-ACETAMINOPHEN 5-325 MG PO TABS: 1.0000 | ORAL_TABLET | ORAL | Status: DC | PRN

## 2014-03-02 NOTE — Telephone Encounter (Signed)
GV ADN PRINTED APPT SCHED ADNA VS FOR PT FOR FEB

## 2014-03-02 NOTE — Progress Notes (Signed)
  [place] OFFICE PROGRESS NOTE   Diagnosis: Colon cancer  INTERVAL HISTORY:   [name] returns prior to a scheduled visit. She is taking iron. No bleeding. She completes of increased pain in the upper abdomen. She is taking 1 or 2 Percocet tablets per day for relief of pain. No nausea or vomiting. Her appetite is variable.  Objective:  Vital signs in last 24 hours:  Blood pressure 113/69, pulse 92, temperature 98.6 F (37 C), temperature source Oral, resp. rate 18, height $RemoveBe'5\' 2"'[id number]$  (1.575 m), weight 219 lb 6.4 oz (99.519 kg), SpO2 98 %.    HEENT: Neck without mass Lymphatics: No cervical, supra-clavicular, axillary, or inguinal nodes Resp: Lungs clear bilaterally Cardio: Regular rate and rhythm GI: No hepatomegaly, no mass, tender in the mid upper abdomen Vascular: No leg edema   Lab Results:  Lab Results  Component Value Date   WBC 7.7 03/02/2014   HGB 10.2* 03/02/2014   HCT 32.2* 03/02/2014   MCV 72.5* 03/02/2014   PLT 344 03/02/2014   NEUTROABS 4.9 03/02/2014     Medications: I have reviewed the patient's current medications.  Assessment/Plan: 1. 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   Presentation to the emergency room 02/02/2013 with a colonic obstruction secondary to tumor at the splenic flexure.   Baseline CEA on 02/02/2013 less than 0.5.   Initiation of adjuvant Xeloda 04/10/2013.   Cycle 2 adjuvant Xeloda 05/01/2013.   Cycle 3 adjuvant Xeloda 05/22/2013.   Cycle 4 adjuvant Xeloda 06/12/2013.   Cycle 5 adjuvant Xeloda 07/03/2013 (Xeloda dose reduced due to hand foot syndrome).   Cycle 6 adjuvant Xeloda 07/24/2013.   Cycle 7 adjuvant Xeloda 08/14/2013   Cycle 8  adjuvant Xeloda 09/07/2013.  2. History of iron deficiency anemia. Recurrent anemia 02/01/2014.-Stable  3. Asthma. 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 5. Status post ostomy reversal 11/25/2013. 6. CT 02/01/2014 with no evidence of local tumor recurrence or metastatic disease. New area of masslike thickening and small bowel dilatation at the mid small bowel   Disposition:  Ms. Paradise has increased abdominal pain. I suspect the pain is related to the small bowel process identified only 02/01/2014 CT. She will continue Percocet as needed for relief of pain. She will continue iron.  Dr. Carlean Purl referred her to [facility] for a specialized endoscopy procedure and attempt to obtain a diagnostic biopsy. This cannot be performed until late March. Dr. Carlean Purl will see if this procedure can be performed earlier at [facility] or [facility].   I contacted Dr. Georgette Dover. He will see Ms. Simonis to consider a surgical biopsy. She will return for an office visit here 03/23/2014. We will see her sooner as needed.  Betsy Coder, MD  03/02/2014  1:21 PM

## 2014-03-02 NOTE — Telephone Encounter (Signed)
GV AND PRINTED APPT SCHED AND AVS FO RPT FOR FEB

## 2014-03-04 ENCOUNTER — Telehealth: Payer: Self-pay

## 2014-03-04 ENCOUNTER — Other Ambulatory Visit: Payer: Medicare Other

## 2014-03-04 ENCOUNTER — Ambulatory Visit: Payer: Medicare Other | Admitting: Nurse Practitioner

## 2014-03-04 ENCOUNTER — Ambulatory Visit (INDEPENDENT_AMBULATORY_CARE_PROVIDER_SITE_OTHER): Payer: Self-pay | Admitting: Surgery

## 2014-03-04 DIAGNOSIS — K6389 Other specified diseases of intestine: Secondary | ICD-10-CM | POA: Diagnosis not present

## 2014-03-04 DIAGNOSIS — K432 Incisional hernia without obstruction or gangrene: Secondary | ICD-10-CM | POA: Diagnosis not present

## 2014-03-04 DIAGNOSIS — C185 Malignant neoplasm of splenic flexure: Secondary | ICD-10-CM | POA: Diagnosis not present

## 2014-03-04 NOTE — Telephone Encounter (Signed)
Called [name] in film room of radiology, pt will be picking up disc of recent scans today. Informed Tedra Coupe she should be in town early this afternoon if she could have it ready for her. Tedra Coupe stated she would get it ready.

## 2014-03-07 ENCOUNTER — Telehealth: Payer: Self-pay

## 2014-03-07 ENCOUNTER — Ambulatory Visit (INDEPENDENT_AMBULATORY_CARE_PROVIDER_SITE_OTHER): Payer: Medicare Other

## 2014-03-07 ENCOUNTER — Other Ambulatory Visit: Payer: Self-pay | Admitting: *Deleted

## 2014-03-07 DIAGNOSIS — J309 Allergic rhinitis, unspecified: Secondary | ICD-10-CM

## 2014-03-07 MED ORDER — FLUTICASONE-SALMETEROL 250-50 MCG/DOSE IN AEPB: 1.0000 | INHALATION_SPRAY | Freq: Every day | RESPIRATORY_TRACT | Status: DC | PRN

## 2014-03-07 MED ORDER — MONTELUKAST SODIUM 10 MG PO TABS: 10.0000 mg | ORAL_TABLET | Freq: Every day | ORAL | Status: DC

## 2014-03-07 NOTE — Telephone Encounter (Signed)
Per Dr. Annamaria Boots, ok to refill Advair and Singulair, but do not refill Potassium, this needs to be refilled by Dr. Felipa Eth.  Nothing further needed.

## 2014-03-07 NOTE — Telephone Encounter (Signed)
Appt cancelled with Patrick North, MD  Kellie Moor, RN           Barbera Setters,    Cherly Beach will have her enteroscopy at [facility] so please cancel the appt with Dr. Arsenio Loader.   Thanks   CEG          Previous Messages     ----- Message -----   From: Ladell Pier, MD   Sent: 03/04/2014  6:04 PM    To: Donnie Mesa, MD, Gatha Mayer, MD  Subject: RE: Enteroscopy at [place] like Dr. Malissa Hippo is getting her scheduled  ----- Message -----   From: Gatha Mayer, MD   Sent: 03/04/2014  8:46 AM    To: Ladell Pier, MD  Subject: RE: Enteroscopy at [facility]              I am glad you were able to get that done  Do you want [place] to cancel the [facility] appt?  ----- Message -----   From: Ladell Pier, MD   Sent: 03/04/2014  8:40 AM    To: Gatha Mayer, MD  Subject: RE: Enteroscopy at [place]              I spoke to Dr. Malissa Hippo. He will see her.  ----- Message -----   From: Gatha Mayer, MD   Sent: 03/03/2014  8:23 AM    To: Ladell Pier, MD  Subject: Enteroscopy at [facility] is name of guy that does deep enteroscopy   [phone] is office #   I have not had a chance to try to contact him   If you do use my name as your contact person   I can try to call him midday perhaps   Another # that might be his cell   [phone]

## 2014-03-08 ENCOUNTER — Telehealth: Payer: Self-pay | Admitting: *Deleted

## 2014-03-08 NOTE — Telephone Encounter (Signed)
Patient called to inquire about referral to [facility] for balloon procedure.  "I have not heard anything from [place] yet.  [name] was going to make some calls to try to have procedure done earlier than the April 22, 2014 appointment at [facility]."  I will notify provider of her concern.

## 2014-03-08 NOTE — Telephone Encounter (Signed)
Left message on voicemail instructing pt to expect call from Dr. Sharee Pimple office with appt. Requested return call if she does not hear from them in the next couple days.

## 2014-03-09 ENCOUNTER — Telehealth: Payer: Self-pay | Admitting: Oncology

## 2014-03-09 NOTE — Telephone Encounter (Signed)
Faxed demographics, last office note and CT report to Dr. Sharee Pimple office attn: Guerry Bruin [phone].

## 2014-03-09 NOTE — Telephone Encounter (Signed)
Faxed pt medical records to [facility] [phone]

## 2014-03-10 ENCOUNTER — Telehealth: Payer: Self-pay | Admitting: *Deleted

## 2014-03-10 DIAGNOSIS — C184 Malignant neoplasm of transverse colon: Secondary | ICD-10-CM

## 2014-03-10 NOTE — Telephone Encounter (Signed)
DR.SHERRILL'S NURSE,TANYA WHITLOCK,RN WILL CALL PT.

## 2014-03-10 NOTE — Telephone Encounter (Signed)
Spoke with pt, she wanted to be sure it was OK to stop iron for her procedure on 2/17. Instructions from [facility] were to stop iron 7 days prior. YES. Will resume iron after procedure. Pt voiced understanding. Pt is requesting Ensure samples since she has a poor appetite. Will request dietician contact pt.

## 2014-03-16 DIAGNOSIS — Z9221 Personal history of antineoplastic chemotherapy: Secondary | ICD-10-CM | POA: Diagnosis not present

## 2014-03-16 DIAGNOSIS — R933 Abnormal findings on diagnostic imaging of other parts of digestive tract: Secondary | ICD-10-CM | POA: Diagnosis not present

## 2014-03-16 DIAGNOSIS — Z85038 Personal history of other malignant neoplasm of large intestine: Secondary | ICD-10-CM | POA: Diagnosis not present

## 2014-03-16 DIAGNOSIS — K2960 Other gastritis without bleeding: Secondary | ICD-10-CM | POA: Diagnosis not present

## 2014-03-16 DIAGNOSIS — G4730 Sleep apnea, unspecified: Secondary | ICD-10-CM | POA: Diagnosis not present

## 2014-03-16 DIAGNOSIS — I10 Essential (primary) hypertension: Secondary | ICD-10-CM | POA: Diagnosis not present

## 2014-03-16 DIAGNOSIS — K2970 Gastritis, unspecified, without bleeding: Secondary | ICD-10-CM | POA: Diagnosis not present

## 2014-03-16 DIAGNOSIS — J45909 Unspecified asthma, uncomplicated: Secondary | ICD-10-CM | POA: Diagnosis not present

## 2014-03-16 DIAGNOSIS — R935 Abnormal findings on diagnostic imaging of other abdominal regions, including retroperitoneum: Secondary | ICD-10-CM | POA: Diagnosis not present

## 2014-03-16 DIAGNOSIS — D509 Iron deficiency anemia, unspecified: Secondary | ICD-10-CM | POA: Diagnosis not present

## 2014-03-16 DIAGNOSIS — K219 Gastro-esophageal reflux disease without esophagitis: Secondary | ICD-10-CM | POA: Diagnosis not present

## 2014-03-17 ENCOUNTER — Telehealth: Payer: Self-pay

## 2014-03-17 DIAGNOSIS — R933 Abnormal findings on diagnostic imaging of other parts of digestive tract: Secondary | ICD-10-CM

## 2014-03-17 NOTE — Telephone Encounter (Signed)
Discussed with the patient.  She does not want to drive down from [facility] for a pre-visit due to transportation.  I went over the instructions with her in detail and have mailed her the instructions.  She is scheduled for 03/22/14 at 8:00.  She verbalized understanding of risk ok it getting "stuck" and the need for surgery.

## 2014-03-17 NOTE — Telephone Encounter (Signed)
-----   Message from Gatha Mayer, MD sent at 03/17/2014  9:52 AM EST ----- This lady needs a capsule endoscopy of small bowel  She is a surgical candidate and they will operate if needed   I will discuss with her but can you please start the conversation and get an appt - then let me know so I can call her adn explain more  Areta Haber is to see if CT was correct I think  If capsule gets stuck that could be a good thing to help [place] know where the problem is also  Dx is abnormal intestine on CT   ----- Message -----    From: Ladell Pier, MD    Sent: 03/16/2014   1:06 PM      To: Donnie Mesa, MD, Gatha Mayer, MD  Dr. Malissa Hippo performed the endoscopy today. He reports reaching beyond the mid point of the jejunum and there was no abnormality. He tattooed the last point he was able to visualize.  He recommends a capsule endoscopy.  We will you schedule this?  If this is negative I will schedule a repeat CT prior to referring her for surgery  Thanks,  Leroy Sea

## 2014-03-22 ENCOUNTER — Ambulatory Visit (INDEPENDENT_AMBULATORY_CARE_PROVIDER_SITE_OTHER): Payer: Medicare Other | Admitting: Internal Medicine

## 2014-03-22 ENCOUNTER — Ambulatory Visit (INDEPENDENT_AMBULATORY_CARE_PROVIDER_SITE_OTHER): Payer: Medicare Other

## 2014-03-22 DIAGNOSIS — J309 Allergic rhinitis, unspecified: Secondary | ICD-10-CM | POA: Diagnosis not present

## 2014-03-22 DIAGNOSIS — R933 Abnormal findings on diagnostic imaging of other parts of digestive tract: Secondary | ICD-10-CM

## 2014-03-22 NOTE — Progress Notes (Signed)
Patient here for capsule endoscopy. Tolerated procedure. Verbalizes understanding of written and verbal instructions. SB 3  Lot number [id number] exp 2017-02

## 2014-03-23 ENCOUNTER — Other Ambulatory Visit (HOSPITAL_BASED_OUTPATIENT_CLINIC_OR_DEPARTMENT_OTHER): Payer: Medicare Other

## 2014-03-23 ENCOUNTER — Ambulatory Visit (HOSPITAL_BASED_OUTPATIENT_CLINIC_OR_DEPARTMENT_OTHER): Payer: Medicare Other | Admitting: Nurse Practitioner

## 2014-03-23 ENCOUNTER — Telehealth: Payer: Self-pay | Admitting: Nurse Practitioner

## 2014-03-23 ENCOUNTER — Telehealth: Payer: Self-pay | Admitting: Internal Medicine

## 2014-03-23 ENCOUNTER — Ambulatory Visit (HOSPITAL_BASED_OUTPATIENT_CLINIC_OR_DEPARTMENT_OTHER): Payer: Medicare Other

## 2014-03-23 ENCOUNTER — Ambulatory Visit: Payer: Medicare Other | Admitting: Nutrition

## 2014-03-23 VITALS — BP 131/85 | HR 55 | Temp 98.3°F | Resp 18 | Ht 62.0 in | Wt 213.7 lb

## 2014-03-23 DIAGNOSIS — C185 Malignant neoplasm of splenic flexure: Secondary | ICD-10-CM

## 2014-03-23 DIAGNOSIS — D509 Iron deficiency anemia, unspecified: Secondary | ICD-10-CM

## 2014-03-23 DIAGNOSIS — Z85038 Personal history of other malignant neoplasm of large intestine: Secondary | ICD-10-CM

## 2014-03-23 DIAGNOSIS — D649 Anemia, unspecified: Secondary | ICD-10-CM

## 2014-03-23 DIAGNOSIS — R109 Unspecified abdominal pain: Secondary | ICD-10-CM | POA: Diagnosis not present

## 2014-03-23 LAB — COMPREHENSIVE METABOLIC PANEL (CC13)
ALT: 7 U/L (ref 0–55)
ANION GAP: 10 meq/L (ref 3–11)
AST: 9 U/L (ref 5–34)
Albumin: 3.1 g/dL — ABNORMAL LOW (ref 3.5–5.0)
Alkaline Phosphatase: 141 U/L (ref 40–150)
BILIRUBIN TOTAL: 0.36 mg/dL (ref 0.20–1.20)
BUN: 9.9 mg/dL (ref 7.0–26.0)
CO2: 27 meq/L (ref 22–29)
CREATININE: 0.8 mg/dL (ref 0.6–1.1)
Calcium: 9.5 mg/dL (ref 8.4–10.4)
Chloride: 102 mEq/L (ref 98–109)
EGFR: 88 mL/min/{1.73_m2} — AB (ref >90)
Glucose: 109 mg/dl (ref 70–140)
Potassium: 3.4 mEq/L — ABNORMAL LOW (ref 3.5–5.1)
Sodium: 139 mEq/L (ref 136–145)
Total Protein: 6.8 g/dL (ref 6.4–8.3)

## 2014-03-23 LAB — CBC WITH DIFFERENTIAL/PLATELET
BASO%: 0.6 % (ref 0.0–2.0)
BASOS ABS: 0.1 10*3/uL (ref 0.0–0.1)
EOS%: 2 % (ref 0.0–7.0)
Eosinophils Absolute: 0.2 10*3/uL (ref 0.0–0.5)
HEMATOCRIT: 32.9 % — AB (ref 34.8–46.6)
HGB: 10.4 g/dL — ABNORMAL LOW (ref 11.6–15.9)
LYMPH#: 1.6 10*3/uL (ref 0.9–3.3)
LYMPH%: 19.8 % (ref 14.0–49.7)
MCH: 22.3 pg — ABNORMAL LOW (ref 25.1–34.0)
MCHC: 31.6 g/dL (ref 31.5–36.0)
MCV: 70.6 fL — ABNORMAL LOW (ref 79.5–101.0)
MONO#: 0.9 10*3/uL (ref 0.1–0.9)
MONO%: 11.2 % (ref 0.0–14.0)
NEUT#: 5.3 10*3/uL (ref 1.5–6.5)
NEUT%: 66.4 % (ref 38.4–76.8)
Platelets: 415 10*3/uL — ABNORMAL HIGH (ref 145–400)
RBC: 4.66 10*6/uL (ref 3.70–5.45)
RDW: 16.4 % — ABNORMAL HIGH (ref 11.2–14.5)
WBC: 8 10*3/uL (ref 3.9–10.3)

## 2014-03-23 MED ORDER — OXYCODONE-ACETAMINOPHEN 5-325 MG PO TABS: 1.0000 | ORAL_TABLET | ORAL | Status: DC | PRN

## 2014-03-23 NOTE — Progress Notes (Signed)
Nutrition follow-up completed with patient who was diagnosed with colon cancer.  Last nutrition visit was March 2015.  Weight documented as 219.4 pounds February 3. Patient reports her appetite comes and goes. Patient feels tired and weak. She is able to verbalize importance of nutrition. She tries to drink Ensure Plus once or twice daily. Patient denies other nutrition impact symptoms. Patient requesting prescription for Ensure Plus that she can take to the [facility] cancer association.  They may provide her with this product free of charge.  Nutrition diagnosis: Food and nutrition related knowledge deficit has improved.  Intervention: Patient educated to consume small frequent meals and snacks with high protein foods. Encouraged patient to strive for weight maintenance. Recommended patient consume Ensure Plus twice a day. Provider has agreed to write prescription. Teach back method used.  Questions were answered.  Contact information given. Patient provided with fact sheets and coupons.  Monitoring, evaluation, goals: Patient will tolerate adequate calories and protein to promote weight maintenance.  Next visit: Patient will contact me for questions or concerns.  **Disclaimer: This note was dictated with voice recognition software. Similar sounding words can inadvertently be transcribed and this note may contain transcription errors which may not have been corrected upon publication of note.**

## 2014-03-23 NOTE — Telephone Encounter (Signed)
Pt confirmed labs/ov per 02/24 POF, gave pt AVS.... KJ  °

## 2014-03-23 NOTE — Telephone Encounter (Signed)
I called [name] asking her to call the main # to sch. Her appt. Next wk.. I didn't hear if she said next Tues. or if she was going to call yesterday.

## 2014-03-23 NOTE — Progress Notes (Addendum)
  [place] OFFICE PROGRESS NOTE   Diagnosis:  Colon cancer  INTERVAL HISTORY:   [name] returns as scheduled. She continues to have intermittent upper abdominal pain. She typically takes Percocet one time a day. She denies nausea/vomiting. No diarrhea or constipation. She denies any bleeding. No fevers or sweats. Appetite varies. She continues to lose weight.  Objective:  Vital signs in last 24 hours:  Blood pressure 131/85, pulse 55, temperature 98.3 F (36.8 C), temperature source Oral, resp. rate 18, height _0  (1.575 m), weight 213 lb 11.2 oz (96.934 kg), SpO2 100 %.    HEENT: No thrush or ulcers. Lymphatics: No palpable cervical, supraclavicular, axillary or inguinal lymph nodes. Resp: Lungs clear bilaterally. Cardio: Regular rate and rhythm. GI: Abdomen soft and nontender. No hepatomegaly. Masslike fullness mid left upper abdomen. Vascular: No leg edema.  Lab Results:  Lab Results  Component Value Date   WBC 8.0 03/23/2014   HGB 10.4* 03/23/2014   HCT 32.9* 03/23/2014   MCV 70.6* 03/23/2014   PLT 415* 03/23/2014   NEUTROABS 5.3 03/23/2014    Imaging:  No results found.  Medications: I have reviewed the patient's current medications.  Assessment/Plan: 1. 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   Presentation to the emergency room 02/02/2013 with a colonic obstruction secondary to tumor at the splenic flexure.   Baseline CEA on 02/02/2013 less than 0.5.   Initiation of adjuvant Xeloda 04/10/2013.   Cycle 2 adjuvant Xeloda 05/01/2013.   Cycle 3 adjuvant Xeloda 05/22/2013.   Cycle 4 adjuvant Xeloda 06/12/2013.   Cycle 5 adjuvant Xeloda 07/03/2013 (Xeloda dose reduced due  to hand foot syndrome).   Cycle 6 adjuvant Xeloda 07/24/2013.   Cycle 7 adjuvant Xeloda 08/14/2013   Cycle 8 adjuvant Xeloda 09/07/2013.  2. History of iron deficiency anemia. Recurrent anemia 02/01/2014.-Stable  3. Asthma. 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 5. Status post ostomy reversal 11/25/2013. 6. CT 02/01/2014 with no evidence of local tumor recurrence or metastatic disease. New area of masslike thickening and small bowel dilatation at the mid small bowel 7. Status post deep enteroscopy at [facility] on 03/16/2014, negative. 8. Status post capsule endoscopy 03/22/2014, results pending.   Disposition: [name] continues to have abdominal pain and is losing weight. She appears to have a palpable upper abdominal mass on exam today. We are referring her for repeat CT scans to confirm. Once the CT result is available we will contact Dr. Gershon Crane.  She was given a new prescription for Percocet at today's visit.  Her next visit here is scheduled 04/26/2014. We will see her sooner if needed.  Patient seen with [name].  Ned Card ANP/GNP-BC   03/23/2014  10:40 AM  This was a shared visit with Ned Card. [name] continues to have upper abdominal pain.  We will f/u on the capsule endoscopy and refer to Dr. Gershon Crane for a biopsy.  She will have a restaging CT today.  Julieanne Manson, MD

## 2014-03-24 ENCOUNTER — Telehealth: Payer: Self-pay | Admitting: Internal Medicine

## 2014-03-24 NOTE — Progress Notes (Signed)
   Capsule endoscopy showed mid-small bowel tumor(s). ? More than1 tumor vs capsule moving to and fro and showing same tumor repeatedly. Capsule did not reach colon before battery expired at 8 hrs.  Surgery will be next step per prior discussions w/ Dr. Georgette Dover

## 2014-03-24 NOTE — Telephone Encounter (Signed)
I called her and explained that I saw tumor in small intestine on the capsule study. She was advised that [name] and/or Dr/ Georgette Dover should be in touch about next step which should be surgery, unless next CT scan tells [place] anything different.  She was told to seek medical attention if she develops bad abdominal pain and nausea.vomiting

## 2014-03-25 ENCOUNTER — Telehealth: Payer: Self-pay | Admitting: *Deleted

## 2014-03-25 NOTE — Telephone Encounter (Signed)
Patient called to ask whether she needs to have the CT scan ordered by [name] since Dr. Carlean Purl had called to inform her that there was tumor seen on the capsule study that was done.  Advised her to please keep the CT scan appointment for treatment planning.

## 2014-03-28 ENCOUNTER — Encounter: Payer: Self-pay | Admitting: Internal Medicine

## 2014-03-28 NOTE — Telephone Encounter (Signed)
Pt. Didn't call back, so I called her this afternoon and she made an appt. For 03/29/14. Nothing further was needed.

## 2014-03-28 NOTE — Telephone Encounter (Signed)
Pt called in wanted to know if she needs to keep Ct appt?  She has already been advised to keep it but wants to know if Dr Camila Li wants her to keep it?

## 2014-03-29 ENCOUNTER — Telehealth: Payer: Self-pay | Admitting: *Deleted

## 2014-03-29 ENCOUNTER — Ambulatory Visit (INDEPENDENT_AMBULATORY_CARE_PROVIDER_SITE_OTHER): Payer: Medicare Other

## 2014-03-29 ENCOUNTER — Ambulatory Visit (HOSPITAL_COMMUNITY)
Admission: RE | Admit: 2014-03-29 | Discharge: 2014-03-29 | Disposition: A | Discharge status: Home / Self Care | Payer: Medicare Other | Source: Ambulatory Visit | Attending: Nurse Practitioner | Admitting: Nurse Practitioner

## 2014-03-29 ENCOUNTER — Encounter (HOSPITAL_COMMUNITY): Payer: Self-pay

## 2014-03-29 DIAGNOSIS — Z9889 Other specified postprocedural states: Secondary | ICD-10-CM | POA: Diagnosis not present

## 2014-03-29 DIAGNOSIS — K6389 Other specified diseases of intestine: Secondary | ICD-10-CM | POA: Diagnosis not present

## 2014-03-29 DIAGNOSIS — J309 Allergic rhinitis, unspecified: Secondary | ICD-10-CM | POA: Diagnosis not present

## 2014-03-29 DIAGNOSIS — Z85038 Personal history of other malignant neoplasm of large intestine: Secondary | ICD-10-CM | POA: Insufficient documentation

## 2014-03-29 DIAGNOSIS — R591 Generalized enlarged lymph nodes: Secondary | ICD-10-CM | POA: Diagnosis not present

## 2014-03-29 DIAGNOSIS — Z9221 Personal history of antineoplastic chemotherapy: Secondary | ICD-10-CM | POA: Diagnosis not present

## 2014-03-29 DIAGNOSIS — R109 Unspecified abdominal pain: Secondary | ICD-10-CM | POA: Insufficient documentation

## 2014-03-29 DIAGNOSIS — C189 Malignant neoplasm of colon, unspecified: Secondary | ICD-10-CM | POA: Diagnosis not present

## 2014-03-29 MED ORDER — IOHEXOL 300 MG/ML  SOLN
100.0000 mL | Freq: Once | INTRAMUSCULAR | Status: AC | PRN
  Administered 2014-03-29: 100 mL via INTRAVENOUS

## 2014-03-29 MED ORDER — IOHEXOL 300 MG/ML  SOLN
50.0000 mL | Freq: Once | INTRAMUSCULAR | Status: AC | PRN
  Administered 2014-03-29: 50 mL via ORAL

## 2014-03-29 NOTE — Telephone Encounter (Signed)
I agree. Pls keep CT appt too Thx

## 2014-03-29 NOTE — Telephone Encounter (Signed)
Pt informed

## 2014-03-29 NOTE — Telephone Encounter (Signed)
Spoke with Nicoletta Ba, PA and CT will address capsule.

## 2014-03-29 NOTE — Telephone Encounter (Signed)
Spoke with patient and she has not seen capsule pass. She is scheduled for CT abdomen today. Does she need an xray or just wait on CT results?

## 2014-04-01 NOTE — Pre-Procedure Instructions (Signed)
ABIR EROH  04/01/2014   Your procedure is scheduled on: Wednesday, march 9.  Report to [facility] Admitting at 6:30 AM.  Call this number if you have problems the morning of surgery: [phone]                For any other questions, please call [phone], Monday - Friday 8 AM - 4 PM.     Remember:   Do not eat food or drink liquids after midnight Tuesday, March 8.   Take these medicines the morning of surgery with A SIP OF WATER: omeprazole (PRILOSEC), cetirizine (ZYRTEC).              May ise inhalers. Please bring Albuterol inhaler to the hospital with you.                May take  oxyCODONE-acetaminophen if needed.                Stop taking Aspirin, Vitamins, Herbal products.   Do not wear jewelry, make-up or nail polish.  Do not wear lotions, powders, or perfumes.   Do not shave 48 hours prior to surgery.   Do not bring valuables to the hospital.             [facility] is not responsible for any belongings or valuables.               Contacts, dentures or bridgework may not be worn into surgery.  Leave suitcase in the car. After surgery it may be brought to your room.  For patients admitted to the hospital, discharge time is determined by your treatment team.               Patients discharged the day of surgery will not be allowed to drive home.  Name and phone number of your driver: -   Special Instructions: Review  [facility] - Preparing For Surgery.   Please read over the following fact sheets that you were given: Pain Booklet, Coughing and Deep Breathing and Surgical Site Infection Prevention

## 2014-04-04 ENCOUNTER — Encounter (HOSPITAL_COMMUNITY): Payer: Self-pay

## 2014-04-04 ENCOUNTER — Ambulatory Visit (INDEPENDENT_AMBULATORY_CARE_PROVIDER_SITE_OTHER): Payer: Medicare Other

## 2014-04-04 ENCOUNTER — Encounter (HOSPITAL_COMMUNITY)
Admission: RE | Admit: 2014-04-04 | Discharge: 2014-04-04 | Disposition: A | Discharge status: Home / Self Care | Payer: Medicare Other | Source: Ambulatory Visit | Attending: Surgery | Admitting: Surgery

## 2014-04-04 DIAGNOSIS — K6389 Other specified diseases of intestine: Secondary | ICD-10-CM

## 2014-04-04 DIAGNOSIS — J309 Allergic rhinitis, unspecified: Secondary | ICD-10-CM

## 2014-04-04 DIAGNOSIS — Z01818 Encounter for other preprocedural examination: Secondary | ICD-10-CM

## 2014-04-04 DIAGNOSIS — C171 Malignant neoplasm of jejunum: Secondary | ICD-10-CM | POA: Diagnosis not present

## 2014-04-04 DIAGNOSIS — K432 Incisional hernia without obstruction or gangrene: Secondary | ICD-10-CM

## 2014-04-04 DIAGNOSIS — C772 Secondary and unspecified malignant neoplasm of intra-abdominal lymph nodes: Secondary | ICD-10-CM | POA: Diagnosis not present

## 2014-04-04 HISTORY — DX: Reserved for inherently not codable concepts without codable children: IMO0001

## 2014-04-04 HISTORY — DX: Depression, unspecified: F32.A

## 2014-04-04 HISTORY — DX: Major depressive disorder, single episode, unspecified: F32.9

## 2014-04-04 LAB — CBC
HEMATOCRIT: 30.5 % — AB (ref 36.0–46.0)
Hemoglobin: 9.8 g/dL — ABNORMAL LOW (ref 12.0–15.0)
MCH: 22 pg — ABNORMAL LOW (ref 26.0–34.0)
MCHC: 32.1 g/dL (ref 30.0–36.0)
MCV: 68.4 fL — ABNORMAL LOW (ref 78.0–100.0)
PLATELETS: 434 10*3/uL — AB (ref 150–400)
RBC: 4.46 MIL/uL (ref 3.87–5.11)
RDW: 16.5 % — ABNORMAL HIGH (ref 11.5–15.5)
WBC: 8.5 10*3/uL (ref 4.0–10.5)

## 2014-04-04 LAB — BASIC METABOLIC PANEL
ANION GAP: 7 (ref 5–15)
BUN: 11 mg/dL (ref 6–23)
CALCIUM: 8.9 mg/dL (ref 8.4–10.5)
CHLORIDE: 102 mmol/L (ref 96–112)
CO2: 26 mmol/L (ref 19–32)
Creatinine, Ser: 0.74 mg/dL (ref 0.50–1.10)
GFR calc Af Amer: >90 mL/min (ref >90)
GFR calc non Af Amer: 88 mL/min — ABNORMAL LOW (ref >90)
GLUCOSE: 106 mg/dL — AB (ref 70–99)
POTASSIUM: 3.7 mmol/L (ref 3.5–5.1)
SODIUM: 135 mmol/L (ref 135–145)

## 2014-04-04 NOTE — Progress Notes (Signed)
Hgb 9.8, I notified Jennifer at [facility] and requested orders.

## 2014-04-05 ENCOUNTER — Ambulatory Visit (INDEPENDENT_AMBULATORY_CARE_PROVIDER_SITE_OTHER): Payer: Self-pay | Admitting: Surgery

## 2014-04-05 ENCOUNTER — Encounter (HOSPITAL_COMMUNITY): Payer: Self-pay

## 2014-04-05 MED ORDER — DEXTROSE 5 % IV SOLN
2.0000 g | INTRAVENOUS | Status: AC
  Administered 2014-04-06: 2 g via INTRAVENOUS
  Filled 2014-04-05: qty 2

## 2014-04-05 NOTE — Progress Notes (Signed)
Anesthesia Chart Review:  Patient is a 64 year old female posted for exploratory laparotomy, small bowel resection, VHR on 04/06/14 by Dr. Georgette Dover. (Orders are pending.)  She has a history of colon cancer s/p resection in 2015.  Recent capsule study showed new mass.  History includes non-smoker, GERD, HTN, OSA on CPAP, anemia, COPD, childhood murmur, depression, exertional dyspnea, hysterectomy, cholecystectomy, colon cancer s/p partial colectomy with colostomy 02/04/13 s/p colostomy takedown 11/25/13. BMI is consistent with obesity.  PCP is Dr. Alain Marion. HEM-ONC is [name]. GI is Dr. Carlean Purl.   04/04/14 EKG: NSR with sinus arrhythmia.  02/07/13 Echo: - Study data: Technically difficult study. - Left ventricle: The cavity size was normal. Wall thickness was normal. Systolic function was normal. The estimated ejection fraction was in the range of 60% to 65%. Doppler parameters are consistent with abnormal left ventricular relaxation (grade 1 diastolic dysfunction). - Aortic valve: Valve area: 3.27cm^2(VTI). Valve area: 2.63cm^2 (Vmax). - Pulmonary arteries: PASP is bordeline elevated. PA peak pressure: 12mm Hg (S) - Tricuspid valve: Mild regurgitation.  She had a normal stress nuclear study, EF 70% on 11/06/10.  10/08/13 CXR: No active cardiopulmonary disease.  Preoperative labs noted. H/H 9.8/30.5, overall stable since 12/04/13. H/H already called to [facility].  Orders are still pending, but posting indicates small bowel resection (oncology notes mention biopsy for tissue diagnosis). Based on current posting and HGB of < 10, will order a T&S. (If only biopsy is planned then she may not need additional labs, and then her surgeon or anesthesiologist could cancel my order if desired.)  [facility] Short Stay Center/Anesthesiology Phone [phone] 04/05/2014 9:51 AM

## 2014-04-06 ENCOUNTER — Inpatient Hospital Stay (HOSPITAL_COMMUNITY)
Admission: RE | Admit: 2014-04-06 | Discharge: 2014-04-12 | DRG: 336 | Disposition: A | Discharge status: Home Health | Payer: Medicare Other | Source: Ambulatory Visit | Attending: Surgery | Admitting: Surgery

## 2014-04-06 ENCOUNTER — Inpatient Hospital Stay (HOSPITAL_COMMUNITY): Payer: Medicare Other | Admitting: Vascular Surgery

## 2014-04-06 ENCOUNTER — Encounter (HOSPITAL_COMMUNITY)
Admission: RE | Disposition: A | Discharge status: Home Health | Payer: Self-pay | Source: Ambulatory Visit | Attending: Surgery

## 2014-04-06 ENCOUNTER — Encounter (HOSPITAL_COMMUNITY): Payer: Self-pay | Admitting: *Deleted

## 2014-04-06 ENCOUNTER — Inpatient Hospital Stay (HOSPITAL_COMMUNITY): Payer: Medicare Other | Admitting: Certified Registered Nurse Anesthetist

## 2014-04-06 DIAGNOSIS — K660 Peritoneal adhesions (postprocedural) (postinfection): Secondary | ICD-10-CM | POA: Diagnosis present

## 2014-04-06 DIAGNOSIS — Z6839 Body mass index (BMI) 39.0-39.9, adult: Secondary | ICD-10-CM | POA: Diagnosis not present

## 2014-04-06 DIAGNOSIS — I10 Essential (primary) hypertension: Secondary | ICD-10-CM | POA: Diagnosis present

## 2014-04-06 DIAGNOSIS — C772 Secondary and unspecified malignant neoplasm of intra-abdominal lymph nodes: Secondary | ICD-10-CM | POA: Diagnosis present

## 2014-04-06 DIAGNOSIS — C171 Malignant neoplasm of jejunum: Secondary | ICD-10-CM | POA: Diagnosis present

## 2014-04-06 DIAGNOSIS — C185 Malignant neoplasm of splenic flexure: Secondary | ICD-10-CM | POA: Diagnosis not present

## 2014-04-06 DIAGNOSIS — K432 Incisional hernia without obstruction or gangrene: Secondary | ICD-10-CM | POA: Diagnosis present

## 2014-04-06 DIAGNOSIS — K219 Gastro-esophageal reflux disease without esophagitis: Secondary | ICD-10-CM | POA: Diagnosis not present

## 2014-04-06 DIAGNOSIS — J309 Allergic rhinitis, unspecified: Secondary | ICD-10-CM | POA: Diagnosis not present

## 2014-04-06 DIAGNOSIS — D490 Neoplasm of unspecified behavior of digestive system: Secondary | ICD-10-CM | POA: Diagnosis not present

## 2014-04-06 DIAGNOSIS — M549 Dorsalgia, unspecified: Secondary | ICD-10-CM | POA: Diagnosis not present

## 2014-04-06 DIAGNOSIS — C179 Malignant neoplasm of small intestine, unspecified: Secondary | ICD-10-CM | POA: Diagnosis not present

## 2014-04-06 HISTORY — PX: VENTRAL HERNIA REPAIR: SHX424

## 2014-04-06 HISTORY — PX: BOWEL RESECTION: SHX1257

## 2014-04-06 HISTORY — PX: OTHER SURGICAL HISTORY: SHX169

## 2014-04-06 HISTORY — PX: LAPAROTOMY: SHX154

## 2014-04-06 LAB — CREATININE, SERUM
CREATININE: 0.86 mg/dL (ref 0.50–1.10)
GFR calc non Af Amer: 70 mL/min — ABNORMAL LOW (ref >90)
GFR, EST AFRICAN AMERICAN: 81 mL/min — AB (ref >90)

## 2014-04-06 LAB — GLUCOSE, CAPILLARY: GLUCOSE-CAPILLARY: 145 mg/dL — AB (ref 70–99)

## 2014-04-06 LAB — CBC
HCT: 30.4 % — ABNORMAL LOW (ref 36.0–46.0)
HEMOGLOBIN: 9.4 g/dL — AB (ref 12.0–15.0)
MCH: 21.4 pg — AB (ref 26.0–34.0)
MCHC: 30.9 g/dL (ref 30.0–36.0)
MCV: 69.2 fL — ABNORMAL LOW (ref 78.0–100.0)
Platelets: 404 10*3/uL — ABNORMAL HIGH (ref 150–400)
RBC: 4.39 MIL/uL (ref 3.87–5.11)
RDW: 16.4 % — AB (ref 11.5–15.5)
WBC: 10.6 10*3/uL — ABNORMAL HIGH (ref 4.0–10.5)

## 2014-04-06 LAB — TYPE AND SCREEN
ABO/RH(D): A NEG
ANTIBODY SCREEN: NEGATIVE

## 2014-04-06 SURGERY — LAPAROTOMY, EXPLORATORY
Anesthesia: General | Site: Abdomen

## 2014-04-06 MED ORDER — IRBESARTAN 150 MG PO TABS
150.0000 mg | ORAL_TABLET | Freq: Every day | ORAL | Status: DC
  Administered 2014-04-06 – 2014-04-12 (×7): 150 mg via ORAL
  Filled 2014-04-06 (×7): qty 1

## 2014-04-06 MED ORDER — MORPHINE SULFATE (PF) 1 MG/ML IV SOLN
INTRAVENOUS | Status: DC
Start: 2014-04-06 — End: 2014-04-08
  Administered 2014-04-06: 9 mg via INTRAVENOUS
  Administered 2014-04-06: 18 mg via INTRAVENOUS
  Administered 2014-04-06: 19:00:00 via INTRAVENOUS
  Administered 2014-04-06: 10.5 mg via INTRAVENOUS
  Administered 2014-04-06: 12:00:00 via INTRAVENOUS
  Administered 2014-04-07: 3 mg via INTRAVENOUS
  Administered 2014-04-07: 9 mg via INTRAVENOUS
  Administered 2014-04-07: 6 mg via INTRAVENOUS
  Administered 2014-04-07: 1.5 mg via INTRAVENOUS
  Administered 2014-04-07: 3 mg via INTRAVENOUS
  Administered 2014-04-07: 4.5 mg via INTRAVENOUS
  Administered 2014-04-08 (×2): 1.5 mg via INTRAVENOUS
  Administered 2014-04-08: 7.5 mg via INTRAVENOUS
  Administered 2014-04-08: 10:00:00 via INTRAVENOUS
  Filled 2014-04-06 (×3): qty 25

## 2014-04-06 MED ORDER — POTASSIUM CHLORIDE IN NACL 20-0.9 MEQ/L-% IV SOLN
INTRAVENOUS | Status: DC
  Administered 2014-04-06 – 2014-04-07 (×4): via INTRAVENOUS
  Administered 2014-04-08: 1 mL via INTRAVENOUS
  Administered 2014-04-08 – 2014-04-09 (×3): via INTRAVENOUS
  Administered 2014-04-10: 1 mL via INTRAVENOUS
  Administered 2014-04-11: 04:00:00 via INTRAVENOUS
  Filled 2014-04-06 (×13): qty 1000

## 2014-04-06 MED ORDER — PROPOFOL 10 MG/ML IV BOLUS
INTRAVENOUS | Status: DC | PRN
  Administered 2014-04-06: 120 mg via INTRAVENOUS

## 2014-04-06 MED ORDER — LACTATED RINGERS IV SOLN
INTRAVENOUS | Status: DC | PRN
  Administered 2014-04-06 (×2): via INTRAVENOUS

## 2014-04-06 MED ORDER — PROPOFOL 10 MG/ML IV BOLUS
INTRAVENOUS | Status: AC
  Filled 2014-04-06: qty 20

## 2014-04-06 MED ORDER — DIPHENHYDRAMINE HCL 12.5 MG/5ML PO ELIX: 12.5000 mg | ORAL_SOLUTION | Freq: Four times a day (QID) | ORAL | Status: DC | PRN

## 2014-04-06 MED ORDER — DEXAMETHASONE SODIUM PHOSPHATE 10 MG/ML IJ SOLN
INTRAMUSCULAR | Status: DC | PRN
  Administered 2014-04-06: 10 mg via INTRAVENOUS

## 2014-04-06 MED ORDER — SODIUM CHLORIDE 0.9 % IJ SOLN: 9.0000 mL | INTRAMUSCULAR | Status: DC | PRN

## 2014-04-06 MED ORDER — FENTANYL CITRATE 0.05 MG/ML IJ SOLN
INTRAMUSCULAR | Status: DC | PRN
  Administered 2014-04-06 (×4): 50 ug via INTRAVENOUS
  Administered 2014-04-06: 100 ug via INTRAVENOUS

## 2014-04-06 MED ORDER — HYDROCHLOROTHIAZIDE 25 MG PO TABS
25.0000 mg | ORAL_TABLET | Freq: Every day | ORAL | Status: DC
  Administered 2014-04-06 – 2014-04-12 (×7): 25 mg via ORAL
  Filled 2014-04-06 (×7): qty 1

## 2014-04-06 MED ORDER — CHLORHEXIDINE GLUCONATE 4 % EX LIQD: 1.0000 "application " | Freq: Once | CUTANEOUS | Status: DC

## 2014-04-06 MED ORDER — ONDANSETRON HCL 4 MG PO TABS: 4.0000 mg | ORAL_TABLET | Freq: Four times a day (QID) | ORAL | Status: DC | PRN

## 2014-04-06 MED ORDER — SUCCINYLCHOLINE CHLORIDE 20 MG/ML IJ SOLN
INTRAMUSCULAR | Status: DC | PRN
  Administered 2014-04-06: 140 mg via INTRAVENOUS

## 2014-04-06 MED ORDER — PROMETHAZINE HCL 25 MG/ML IJ SOLN
6.2500 mg | INTRAMUSCULAR | Status: DC | PRN
Start: 2014-04-06 — End: 2014-04-06

## 2014-04-06 MED ORDER — MOMETASONE FURO-FORMOTEROL FUM 100-5 MCG/ACT IN AERO
2.0000 | INHALATION_SPRAY | Freq: Two times a day (BID) | RESPIRATORY_TRACT | Status: DC
  Administered 2014-04-06 – 2014-04-12 (×12): 2 via RESPIRATORY_TRACT
  Filled 2014-04-06: qty 8.8

## 2014-04-06 MED ORDER — HYDROMORPHONE HCL 1 MG/ML IJ SOLN
0.2500 mg | INTRAMUSCULAR | Status: DC | PRN
  Administered 2014-04-06 (×3): 0.5 mg via INTRAVENOUS

## 2014-04-06 MED ORDER — FENTANYL CITRATE 0.05 MG/ML IJ SOLN
INTRAMUSCULAR | Status: AC
  Filled 2014-04-06: qty 5

## 2014-04-06 MED ORDER — DIPHENHYDRAMINE HCL 50 MG/ML IJ SOLN: 12.5000 mg | Freq: Four times a day (QID) | INTRAMUSCULAR | Status: DC | PRN

## 2014-04-06 MED ORDER — ONDANSETRON HCL 4 MG/2ML IJ SOLN: 4.0000 mg | Freq: Four times a day (QID) | INTRAMUSCULAR | Status: DC | PRN

## 2014-04-06 MED ORDER — ONDANSETRON HCL 4 MG/2ML IJ SOLN
INTRAMUSCULAR | Status: DC | PRN
  Administered 2014-04-06: 4 mg via INTRAVENOUS

## 2014-04-06 MED ORDER — MONTELUKAST SODIUM 10 MG PO TABS
10.0000 mg | ORAL_TABLET | Freq: Every day | ORAL | Status: DC
  Administered 2014-04-06 – 2014-04-11 (×6): 10 mg via ORAL
  Filled 2014-04-06 (×6): qty 1

## 2014-04-06 MED ORDER — HYDROMORPHONE HCL 1 MG/ML IJ SOLN
INTRAMUSCULAR | Status: AC
  Filled 2014-04-06: qty 1

## 2014-04-06 MED ORDER — MIDAZOLAM HCL 5 MG/5ML IJ SOLN
INTRAMUSCULAR | Status: DC | PRN
  Administered 2014-04-06: 2 mg via INTRAVENOUS

## 2014-04-06 MED ORDER — HYDROMORPHONE HCL 1 MG/ML IJ SOLN
INTRAMUSCULAR | Status: AC
  Administered 2014-04-06: 0.5 mg via INTRAVENOUS
  Filled 2014-04-06: qty 1

## 2014-04-06 MED ORDER — GLYCOPYRROLATE 0.2 MG/ML IJ SOLN
INTRAMUSCULAR | Status: DC | PRN
  Administered 2014-04-06: 0.6 mg via INTRAVENOUS

## 2014-04-06 MED ORDER — LORATADINE 10 MG PO TABS
10.0000 mg | ORAL_TABLET | Freq: Every day | ORAL | Status: DC
  Administered 2014-04-06 – 2014-04-12 (×7): 10 mg via ORAL
  Filled 2014-04-06 (×7): qty 1

## 2014-04-06 MED ORDER — MORPHINE SULFATE (PF) 1 MG/ML IV SOLN
INTRAVENOUS | Status: AC
  Filled 2014-04-06: qty 25

## 2014-04-06 MED ORDER — NEOSTIGMINE METHYLSULFATE 10 MG/10ML IV SOLN
INTRAVENOUS | Status: DC | PRN
  Administered 2014-04-06: 4 mg via INTRAVENOUS

## 2014-04-06 MED ORDER — DEXTROSE 5 % IV SOLN
1.0000 g | Freq: Four times a day (QID) | INTRAVENOUS | Status: AC
  Administered 2014-04-06 – 2014-04-07 (×3): 1 g via INTRAVENOUS
  Filled 2014-04-06 (×3): qty 1

## 2014-04-06 MED ORDER — MIDAZOLAM HCL 2 MG/2ML IJ SOLN
INTRAMUSCULAR | Status: AC
  Filled 2014-04-06: qty 2

## 2014-04-06 MED ORDER — NALOXONE HCL 0.4 MG/ML IJ SOLN: 0.4000 mg | INTRAMUSCULAR | Status: DC | PRN

## 2014-04-06 MED ORDER — LIDOCAINE HCL (CARDIAC) 20 MG/ML IV SOLN
INTRAVENOUS | Status: DC | PRN
  Administered 2014-04-06: 90 mg via INTRAVENOUS

## 2014-04-06 MED ORDER — ROCURONIUM BROMIDE 100 MG/10ML IV SOLN
INTRAVENOUS | Status: DC | PRN
  Administered 2014-04-06: 10 mg via INTRAVENOUS
  Administered 2014-04-06: 40 mg via INTRAVENOUS

## 2014-04-06 MED ORDER — 0.9 % SODIUM CHLORIDE (POUR BTL) OPTIME
TOPICAL | Status: DC | PRN
  Administered 2014-04-06 (×2): 1000 mL

## 2014-04-06 MED ORDER — ENOXAPARIN SODIUM 40 MG/0.4ML ~~LOC~~ SOLN
40.0000 mg | SUBCUTANEOUS | Status: DC
  Administered 2014-04-07 – 2014-04-12 (×6): 40 mg via SUBCUTANEOUS
  Filled 2014-04-06 (×6): qty 0.4

## 2014-04-06 MED ORDER — VALSARTAN-HYDROCHLOROTHIAZIDE 160-25 MG PO TABS: 1.0000 | ORAL_TABLET | Freq: Every day | ORAL | Status: DC

## 2014-04-06 MED ORDER — ALBUTEROL SULFATE (2.5 MG/3ML) 0.083% IN NEBU: 3.0000 mL | INHALATION_SOLUTION | Freq: Four times a day (QID) | RESPIRATORY_TRACT | Status: DC | PRN

## 2014-04-06 SURGICAL SUPPLY — 62 items
BENZOIN TINCTURE PRP APPL 2/3 (GAUZE/BANDAGES/DRESSINGS) IMPLANT
BINDER ABDOMINAL 12 ML 46-62 (SOFTGOODS) ×3 IMPLANT
BLADE SURG ROTATE 9660 (MISCELLANEOUS) IMPLANT
CANISTER SUCTION 2500CC (MISCELLANEOUS) ×3 IMPLANT
CHLORAPREP W/TINT 26ML (MISCELLANEOUS) ×3 IMPLANT
CLOSURE WOUND 1/2 X4 (GAUZE/BANDAGES/DRESSINGS)
COVER MAYO STAND STRL (DRAPES) IMPLANT
COVER SURGICAL LIGHT HANDLE (MISCELLANEOUS) ×3 IMPLANT
DRAPE LAPAROSCOPIC ABDOMINAL (DRAPES) ×3 IMPLANT
DRAPE PROXIMA HALF (DRAPES) IMPLANT
DRAPE UTILITY XL STRL (DRAPES) ×3 IMPLANT
DRAPE WARM FLUID 44X44 (DRAPE) ×3 IMPLANT
DRSG COVADERM 4X10 (GAUZE/BANDAGES/DRESSINGS) ×3 IMPLANT
DRSG OPSITE POSTOP 4X10 (GAUZE/BANDAGES/DRESSINGS) IMPLANT
DRSG OPSITE POSTOP 4X8 (GAUZE/BANDAGES/DRESSINGS) IMPLANT
ELECT BLADE 6.5 EXT (BLADE) IMPLANT
ELECT CAUTERY BLADE 6.4 (BLADE) ×3 IMPLANT
ELECT REM PT RETURN 9FT ADLT (ELECTROSURGICAL) ×3
ELECTRODE REM PT RTRN 9FT ADLT (ELECTROSURGICAL) ×1 IMPLANT
GAUZE SPONGE 4X4 12PLY STRL (GAUZE/BANDAGES/DRESSINGS) IMPLANT
GLOVE BIO SURGEON STRL SZ7 (GLOVE) ×9 IMPLANT
GLOVE BIOGEL PI IND STRL 7.5 (GLOVE) ×3 IMPLANT
GLOVE BIOGEL PI INDICATOR 7.5 (GLOVE) ×6
GLOVE ECLIPSE 7.5 STRL STRAW (GLOVE) ×6 IMPLANT
GOWN STRL REUS W/ TWL LRG LVL3 (GOWN DISPOSABLE) ×3 IMPLANT
GOWN STRL REUS W/TWL LRG LVL3 (GOWN DISPOSABLE) ×6
KIT BASIN OR (CUSTOM PROCEDURE TRAY) ×3 IMPLANT
KIT ROOM TURNOVER OR (KITS) ×3 IMPLANT
LIGASURE IMPACT 36 18CM CVD LR (INSTRUMENTS) ×3 IMPLANT
NEEDLE HYPO 25GX1X1/2 BEV (NEEDLE) IMPLANT
NS IRRIG 1000ML POUR BTL (IV SOLUTION) ×6 IMPLANT
PACK GENERAL/GYN (CUSTOM PROCEDURE TRAY) ×3 IMPLANT
PAD ARMBOARD 7.5X6 YLW CONV (MISCELLANEOUS) ×6 IMPLANT
PENCIL BUTTON HOLSTER BLD 10FT (ELECTRODE) IMPLANT
RELOAD PROXIMATE 75MM BLUE (ENDOMECHANICALS) ×3 IMPLANT
RETAINER VISCERA MED (MISCELLANEOUS) ×3 IMPLANT
SPECIMEN JAR LARGE (MISCELLANEOUS) ×3 IMPLANT
SPONGE LAP 18X18 X RAY DECT (DISPOSABLE) ×3 IMPLANT
STAPLER PROXIMATE 75MM BLUE (STAPLE) ×9 IMPLANT
STAPLER VISISTAT 35W (STAPLE) ×3 IMPLANT
STRIP CLOSURE SKIN 1/2X4 (GAUZE/BANDAGES/DRESSINGS) IMPLANT
SUCTION POOLE TIP (SUCTIONS) ×3 IMPLANT
SUT MNCRL AB 4-0 PS2 18 (SUTURE) IMPLANT
SUT NOVA NAB GS-21 0 18 T12 DT (SUTURE) IMPLANT
SUT NOVA NAB GS-21 1 T12 (SUTURE) ×12 IMPLANT
SUT PDS AB 1 TP1 96 (SUTURE) ×6 IMPLANT
SUT SILK 2 0 SH CR/8 (SUTURE) ×3 IMPLANT
SUT SILK 2 0 TIES 10X30 (SUTURE) ×3 IMPLANT
SUT SILK 3 0 SH CR/8 (SUTURE) ×3 IMPLANT
SUT SILK 3 0 TIES 10X30 (SUTURE) ×3 IMPLANT
SUT VIC AB 3-0 SH 18 (SUTURE) IMPLANT
SUT VIC AB 3-0 SH 27 (SUTURE)
SUT VIC AB 3-0 SH 27XBRD (SUTURE) IMPLANT
SYR CONTROL 10ML LL (SYRINGE) IMPLANT
TOWEL OR 17X24 6PK STRL BLUE (TOWEL DISPOSABLE) IMPLANT
TOWEL OR 17X26 10 PK STRL BLUE (TOWEL DISPOSABLE) ×3 IMPLANT
TRAY FOLEY CATH 14FRSI W/METER (CATHETERS) ×3 IMPLANT
TRAY FOLEY CATH 16FRSI W/METER (SET/KITS/TRAYS/PACK) IMPLANT
TUBE CONNECTING 12'X1/4 (SUCTIONS)
TUBE CONNECTING 12X1/4 (SUCTIONS) IMPLANT
WATER STERILE IRR 1000ML POUR (IV SOLUTION) IMPLANT
YANKAUER SUCT BULB TIP NO VENT (SUCTIONS) ×3 IMPLANT

## 2014-04-06 NOTE — Anesthesia Procedure Notes (Signed)
Procedure Name: Intubation Date/Time: 04/06/2014 8:35 AM Performed by: Shirlyn Goltz Pre-anesthesia Checklist: Patient identified, Emergency Drugs available, Suction available and Patient being monitored Patient Re-evaluated:Patient Re-evaluated prior to inductionOxygen Delivery Method: Circle system utilized Preoxygenation: Pre-oxygenation with 100% oxygen Intubation Type: IV induction Ventilation: Mask ventilation without difficulty and Oral airway inserted - appropriate to patient size Laryngoscope Size: Mac and 4 Grade View: Grade I Tube type: Oral Tube size: 7.0 mm Number of attempts: 1 Airway Equipment and Method: Stylet Placement Confirmation: ETT inserted through vocal cords under direct vision,  positive ETCO2 and breath sounds checked- equal and bilateral Secured at: 20 cm Tube secured with: Tape Dental Injury: Teeth and Oropharynx as per pre-operative assessment

## 2014-04-06 NOTE — Op Note (Signed)
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 Asst.: Judyann Munson, RNFA Anesthesia: Gen. endotracheal Indications: This is a 64 year old female who presented over a year ago with a obstructing colon cancer at her splenic flexure. We performed an urgent partial colectomy with a temporary proximal transverse colostomy with a long Hartman's pouch. She was treated with chemotherapy. In October 2015 she underwent colostomy reversal. She resumed chemotherapy. 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 She also has small ventral hernias in her upper midline as well as in her right sided colostomy site. She presents now for resection as well as repair of the hernias.  Description of procedure: Patient brought to the operating room and placed in a supine position on the operating room table. After an adequate level of general anesthesia was obtained, a Foley catheter was placed under sterile technique. The patient's abdomen was prepped with ChloraPrep and draped sterile fashion. A timeout was taken to ensure the proper patient and proper procedure. We made a vertical midline incision from the subxiphoid area to just below the umbilicus. Dissection was carried down through the subcutaneous tissues with cautery. We dissected through the linea alba below the epigastric hernia site. We into the peritoneal cavity bluntly. There were a lot of adhesions to the anterior abdominal wall. We slowly took down these adhesions and open the fascia. We entered the hernia sac in the epigastrium which contain only some small bowel and omentum. We cleared the adhesions from the anterior abdominal wall and open the fascia  along the entire length of the incision. On the left side we dissected the small bowel away from the anterior abdominal wall. There is an obvious large mass in the small bowel just to the left of midline. We cleared the adhesions from the abdominal wall on the right. I reduce the small bowel out of the hernia at the old ostomy site. The ostomy site was closed with multiple figure-of-eight #1 Novafil sutures. Once we had taken down all the adhesions to anterior abdominal wall a Balfour retractor was placed to provide exposure. We did extensive lysis of adhesions of the small bowel loops until we could identify the area involved with the probable lymphoma. There was a previous attempt at a long enteroscopy with [place] inked tattoo at the most distal point of enteroscopy. This tattoo was approximately 10 cm proximal to the small bowel mass. There is a 30 cm length of small bowel that seemed to be involved with the tumor. We resected the small bowel on either side of the mass by dividing with a GIA stapler. The mesentery was taken with the LigaSure device. There are several large lymph nodes within the mesentery. The vessels around the lymph nodes were ligated with the LigaSure device as well as 2-0 silk sutures. The specimen was sent for pathologic examination. We inspected for hemostasis. The 2 ends of the small bowel were then used to create a side-to-side stapled anastomosis with the GIA-75 stapler. The anastomosis was closed with a TA 60 stapler. The mesenteric defect was closed with 2-0 silk. A 3-0 silk was used to reinforce the crotch of the anastomosis. We irrigated the abdomen thoroughly and inspected for hemostasis. We closed the midline fascia with multiple interrupted figure-of-eight #1 Novafil sutures.  We did not use mesh because of the small bowel resection. The fascia came together nicely. We irrigated the subcutaneous tissues tissues and used staples close the skin. A dry dressing was applied. All sponge,  initially, and needle counts are correct. The patient was extubated and brought to the recovery room in stable condition.  Imogene Burn. Georgette Dover, MD, [facility] Surgery  General/ Trauma Surgery  04/06/2014 10:56 AM

## 2014-04-06 NOTE — Anesthesia Preprocedure Evaluation (Signed)
Anesthesia Evaluation  Patient identified by MRN, date of birth, ID band Patient awake    Reviewed: Allergy & Precautions, NPO status , Patient's Chart, lab work & pertinent test results  Airway Mallampati: II  TM Distance: <3 FB Neck ROM: Full    Dental no notable dental hx.    Pulmonary sleep apnea , COPD breath sounds clear to auscultation  Pulmonary exam normal       Cardiovascular hypertension, Pt. on medications Rhythm:Regular Rate:Normal     Neuro/Psych negative neurological ROS  negative psych ROS   GI/Hepatic negative GI ROS, Neg liver ROS,   Endo/Other  Morbid obesity  Renal/GU negative Renal ROS  negative genitourinary   Musculoskeletal negative musculoskeletal ROS (+)   Abdominal   Peds negative pediatric ROS (+)  Hematology  (+) anemia ,   Anesthesia Other Findings   Reproductive/Obstetrics negative OB ROS                             Anesthesia Physical Anesthesia Plan  ASA: III  Anesthesia Plan: General   Post-op Pain Management:    Induction: Intravenous  Airway Management Planned: Oral ETT  Additional Equipment:   Intra-op Plan:   Post-operative Plan: Extubation in OR  Informed Consent: I have reviewed the patients History and Physical, chart, labs and discussed the procedure including the risks, benefits and alternatives for the proposed anesthesia with the patient or authorized representative who has indicated his/her understanding and acceptance.   Dental advisory given  Plan Discussed with: CRNA and Surgeon  Anesthesia Plan Comments:         Anesthesia Quick Evaluation

## 2014-04-06 NOTE — Transfer of Care (Signed)
Immediate Anesthesia Transfer of Care Note  Patient: [name]  Procedure(s) Performed: Procedure(s): EXPLORATORY LAPAROTOMY (N/A) SMALL BOWEL RESECTION (N/A) HERNIA REPAIR VENTRAL ADULT (N/A)  Patient Location: PACU  Anesthesia Type:General  Level of Consciousness: awake, alert , oriented and patient cooperative  Airway & Oxygen Therapy: Patient Spontanous Breathing and Patient connected to face mask oxygen  Post-op Assessment: Report given to RN, Post -op Vital signs reviewed and stable, Patient moving all extremities and Patient moving all extremities X 4  Post vital signs: Reviewed and stable  Last Vitals:  Filed Vitals:   04/06/14 1102  BP:   Pulse:   Temp: 36.4 C  Resp:     Complications: No apparent anesthesia complications

## 2014-04-06 NOTE — H&P (Signed)
History of Present Illness [name]. Tania Perrott MD; 03/04/2014 12:19 PM) Patient words: small bowel mass.  The patient is a 64 year old female who presents with colorectal cancer. This is a 64 year old female who initially presented in January 2015 with abdominal pain, nausea, and vomiting. A CT scan showed a mass at the splenic flexure. Sigmoidoscopy showed a large friable fungating tumor obstructing the splenic flexure. Biopsy revealed adenocarcinoma. I performed a partial colectomy with a temporary proximal transverse colostomy in the right upper quadrant. She had a long Hartman's pouch and the distal descending colon. She had a prolonged hospital course and required temporary placement in a skilled nursing facility. She completed a course of chemotherapy (Xeloda). She had a colonoscopy last fall which revealed no other findings in the proximal colon. On November 25, 2013, she underwent colostomy reversal. Again she had a prolonged hospital stay because of her medical comorbidities. She seems to be improving. In January of this year, she underwent a follow-up CT scan of the chest, abdomen, and pelvis. 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 She also has ventral hernias in the upper midline as well as in the right-sided ostomy site. The upper midline hernia is causing some discomfort. Oncology is attempting to get her evaluated by a gastroenterologist with a long enteroscopy for biopsy to determine the treatment for this possible SB lymphoma.     CLINICAL DATA: Subsequent evaluation of 64 year old female with history of colon cancer diagnosed in January 2015 status post colonic resection in October 2015 and chemotherapy (completed in September 2015). EXAM: CT CHEST, ABDOMEN, AND PELVIS WITH CONTRAST TECHNIQUE: Multidetector CT imaging of the chest, abdomen and pelvis was performed following the  standard protocol during bolus administration of intravenous contrast. CONTRAST: 149mL OMNIPAQUE IOHEXOL 300 MG/ML SOLN COMPARISON: CT of the abdomen and pelvis 02/02/2013. FINDINGS: CT CHEST FINDINGS Mediastinum/Lymph Nodes: Heart size is mildly enlarged. There is no significant pericardial fluid, thickening or pericardial calcification. No pathologically enlarged mediastinal or hilar lymph nodes. Esophagus is unremarkable in appearance. Lungs/Pleura: No suspicious appearing pulmonary nodules or masses. No acute consolidative airspace disease. No pleural effusions. Musculoskeletal/Soft Tissues: There are no aggressive appearing lytic or blastic lesions noted in the visualized portions of the skeleton. CT ABDOMEN AND PELVIS FINDINGS Hepatobiliary: Status post cholecystectomy. No cystic or solid hepatic lesions. No intra or extrahepatic biliary ductal dilatation. Pancreas: Unremarkable. Spleen: Unremarkable. Adrenals/Urinary Tract: Bilateral adrenal glands and bilateral kidneys are normal in appearance. No hydroureteronephrosis. Urinary bladder is nearly decompressed, but otherwise unremarkable in appearance. Stomach/Bowel: Normal appearance of the stomach. Compared to the prior study, there has been interval left hemicolectomy. In the pelvis there is an anastomosis between the mid transverse colon and the sigmoid colon. There is evidence of prior right lower quadrant ileostomy, which has been taken down. Notably, however, there appears to be a small hernia at the prior ileostomy site, best appreciated on images 74 and 75 of series 2, where a loop of distal small bowel (likely ileum) extends into the anterior abdominal wall musculature. Importantly, today's study demonstrates a new mildly dilated loop of mid small bowel (likely jejunum) that is markedly thickened (wall measures up to 1.8 cm in thickness), surrounded by haziness in the adjacent mesentery with multiple prominent but  nonenlarged mesenteric lymph nodes. These findings are most apparent on image 61 of series 2, and image 24 of series 5, and this appearance is suspicious for a new small  bowel lymphoma. The other portions of the small bowel are otherwise unremarkable. Vascular/Lymphatic: Minimal atherosclerosis is noted in the abdominal and pelvic vasculature, without evidence of aneurysm or dissection. No lymphadenopathy noted in the abdomen or pelvis. Reproductive: Status post hysterectomy. Ovaries are not confidently identified and may be surgically absent or atrophic. Other: No significant volume of ascites. No pneumoperitoneum. Small ventral hernia in the epigastric region containing a portion of the stomach. Musculoskeletal: There are no aggressive appearing lytic or blastic lesions noted in the visualized portions of the skeleton. IMPRESSION: 1. Status post left hemicolectomy. No findings to suggest local recurrence of disease or metastatic disease in the chest, abdomen or pelvis. 2. However, there is a new area of mass like thickening and dilatation of the mid small bowel, likely jejunum. This is best appreciated on axial image 61 of series 2, and image 24 of series 5 with the bowel wall measures up to 1.8 cm in thickness. This appearance is most suspicious for potential new small bowel lymphoma, and is less likely favored to represent a focal area of enteritis. 3. Evidence of prior right lower quadrant ileostomy. There is a small amount of herniation of ileum into the anterior abdominal wall musculature at the site of ileostomy take-down. No evidence of associated bowel incarceration or obstruction at this time. 4. Additional incidental findings, as above. These results will be called to the ordering clinician or representative by the Radiologist Assistant, and communication documented in the PACS or zVision Dashboard. Electronically Signed By: Vinnie Langton M.D. On: 02/01/2014 14:55      Other  Problems Maia Petties, MD; 03/04/2014 12:19 PM) S/P COLOSTOMY TAKEDOWN (V45.89  Z98.89) ENCOUNTER FOR POSTOPERATIVE WOUND CARE (V58.49  Z48.89) Unspecified Diagnosis VENTRAL INCISIONAL HERNIA WITHOUT OBSTRUCTION OR GANGRENE (553.21  K43.2) SMALL BOWEL MASS (569.89  K63.89) MALIGNANT NEOPLASM OF SPLENIC FLEXURE (153.7  C18.5) Gastroesophageal Reflux Disease High blood pressure Asthma Colon Cancer  Past Surgical History Maia Petties, MD; 03/04/2014 12:19 PM) Hysterectomy (not due to cancer) - Partial Shoulder Surgery Right. Appendectomy Gallbladder Surgery - Open  Diagnostic Studies History Maia Petties, MD; 03/04/2014 12:19 PM) Colonoscopy within last year  Allergies (Ammie Eversole, LPN; 3/0/1601 09:32 AM) No Known Drug Allergies12/04/2013  Medication History (Ammie Eversole, LPN; 04/02/5730 20:25 AM) Oxycodone-Acetaminophen (5-325MG  Tablet, 1-2 Oral q 4 hours as needed, Taken starting 02/03/2014) Active. MetroNIDAZOLE (500MG  Tablet, 2 (two) Tablet Oral as directed, Taken starting 11/11/2013) Active. (Two tablets at 2 o'clock; two tablets at 3 o'clock; and two tablets at 10 o'clock.) Neomycin Sulfate (500MG  Tablet, 2 (two) Tablet Oral as directed, Taken starting 11/11/2013) Active. (Take two tablets at 2 pm, 2 tablets at 3 pm, and 2 tablets at 10 pm.) Advair Diskus (250-50MCG/DOSE Aero Pow Br Act, Inhalation as needed) Active. Fluconazole (200MG  Tablet, Oral) Active. Furosemide (20MG  Tablet, Oral) Active. Montelukast Sodium (10MG  Tablet, Oral) Active. Omeprazole (20MG  Capsule DR, Oral) Active. Potassium Chloride ER (10MEQ Capsule ER, Oral) Active.  Social History Maia Petties, MD; 03/04/2014 12:19 PM) No alcohol use No drug use Tobacco use Never smoker. Caffeine use Coffee.  Family History Maia Petties, MD; 03/04/2014 12:19 PM) Diabetes Mellitus Father. Heart disease in female family member before age 34  Pregnancy / Birth  History Maia Petties, MD; 03/04/2014 12:19 PM) Age at menarche 22 years. Gravida 2 Maternal age 32-20 Para 2  Vitals (Ammie Eversole LPN; 04/29/7060 37:62 AM) 03/04/2014 11:12 AM Weight: 220 lb Height: 62in Body Surface Area: 2.09 m Body Mass Index:  40.24 kg/m Temp.: 98.64F(Oral)  Pulse: 86 (Regular)  BP: 134/80 (Sitting, Left Arm, Standard)    Physical Exam Rodman Key K. Viraj Liby MD; 03/04/2014 12:20 PM) The physical exam findings are as follows: Note:WDWN in NAD HEENT: EOMI, sclera anicteric Neck: No masses, no thyromegaly Lungs: CTA bilaterally; normal respiratory effort CV: Regular rate and rhythm; no murmurs Abd: +bowel sounds, soft, obese; healed upper midline incision/ R subcostal incision/ RLQ ostomy site Mild tenderness in upper midline over a palpable ventral hernia (sac measures about 4 cm); reducible Small ventral hernia at RLQ ostomy site Ext: Well-perfused; no edema Skin: Warm, dry; no sign of jaundice    Assessment & Plan Rodman Key K. Zettie Gootee MD; 03/04/2014 11:29 AM) MALIGNANT NEOPLASM OF SPLENIC FLEXURE (153.7  C18.5) SMALL BOWEL MASS (569.89  K63.89) VENTRAL INCISIONAL HERNIA WITHOUT OBSTRUCTION OR GANGRENE (553.21  K43.2) Current Plans   I would recommend exploratory laparotomy with small bowel resection and we will repair the two ventral incisional hernias at the same time. The surgical procedure has been discussed with the patient. Potential risks, benefits, alternative treatments, and expected outcomes have been explained. All of the patient's questions at this time have been answered. The likelihood of reaching the patient's treatment goal is good.    [name]. Georgette Dover, MD, [facility] Surgery  General/ Trauma Surgery  04/06/2014 8:02 AM

## 2014-04-07 ENCOUNTER — Encounter (HOSPITAL_COMMUNITY): Payer: Self-pay | Admitting: Surgery

## 2014-04-07 LAB — BASIC METABOLIC PANEL
Anion gap: 7 (ref 5–15)
BUN: 6 mg/dL (ref 6–23)
CO2: 27 mmol/L (ref 19–32)
CREATININE: 0.67 mg/dL (ref 0.50–1.10)
Calcium: 8.7 mg/dL (ref 8.4–10.5)
Chloride: 104 mmol/L (ref 96–112)
GFR calc Af Amer: >90 mL/min (ref >90)
GFR calc non Af Amer: >90 mL/min (ref >90)
GLUCOSE: 105 mg/dL — AB (ref 70–99)
POTASSIUM: 4.3 mmol/L (ref 3.5–5.1)
Sodium: 138 mmol/L (ref 135–145)

## 2014-04-07 LAB — CBC
HCT: 27.4 % — ABNORMAL LOW (ref 36.0–46.0)
Hemoglobin: 8.4 g/dL — ABNORMAL LOW (ref 12.0–15.0)
MCH: 21.4 pg — ABNORMAL LOW (ref 26.0–34.0)
MCHC: 30.7 g/dL (ref 30.0–36.0)
MCV: 69.7 fL — ABNORMAL LOW (ref 78.0–100.0)
PLATELETS: 399 10*3/uL (ref 150–400)
RBC: 3.93 MIL/uL (ref 3.87–5.11)
RDW: 16.5 % — ABNORMAL HIGH (ref 11.5–15.5)
WBC: 12.8 10*3/uL — ABNORMAL HIGH (ref 4.0–10.5)

## 2014-04-07 NOTE — Evaluation (Signed)
Physical Therapy Evaluation Patient Details Name: [name] MRN: [id number] DOB: 1950/09/29 Today's Date: 04/07/2014   History of Present Illness  Patient is a 64 y/o female s/p exploratory laparotomy, hernia repair and small bowel resection POD 1.   Clinical Impression  Patient presents with functional limitations due to deficits listed in PT problem list (see below). Pt with generalized weakness, deconditioning and balance deficits impacting mobility. Tolerated short distance ambulation with Min guard assist for safety. Pt will have 24/7 S from spouse at home. Would benefit from continued therapy to maximize independence prior to return home.    Follow Up Recommendations Home health PT;Supervision/Assistance - 24 hour    Equipment Recommendations  None recommended by PT    Recommendations for Other Services       Precautions / Restrictions Precautions Precautions: Fall Precaution Comments: NG tube Restrictions Weight Bearing Restrictions: No      Mobility  Bed Mobility Overal bed mobility: Needs Assistance Bed Mobility: Rolling;Sidelying to Sit Rolling: Min guard Sidelying to sit: HOB elevated;Min assist       General bed mobility comments: Cues for log roll technique. Use of rails. Min A to elevate trunk to upright.  Transfers Overall transfer level: Needs assistance Equipment used: Rolling walker (2 wheeled) Transfers: Sit to/from Stand Sit to Stand: Min guard         General transfer comment: Min guard to rise. Cues for hand placement. Transferred to chair post ambulation bout.  Ambulation/Gait Ambulation/Gait assistance: Min guard Ambulation Distance (Feet): 35 Feet Assistive device: Rolling walker (2 wheeled) Gait Pattern/deviations: Step-through pattern;Decreased stride length;Wide base of support;Decreased step length - left;Decreased step length - right   Gait velocity interpretation: Below normal speed for age/gender General Gait Details: Pt  with very slow, guarded gait. A few short standing rest breaks.  Stairs            Wheelchair Mobility    Modified Rankin (Stroke Patients Only)       Balance Overall balance assessment: Needs assistance Sitting-balance support: Feet supported;No upper extremity supported Sitting balance-Leahy Scale: Good     Standing balance support: During functional activity Standing balance-Leahy Scale: Poor Standing balance comment: Relient on RW                             Pertinent Vitals/Pain Pain Assessment: Faces Faces Pain Scale: No hurt (just pressure in abdomen) Pain Location: abdomen Pain Descriptors / Indicators: Pressure Pain Intervention(s): Monitored during session;PCA encouraged    Home Living Family/patient expects to be discharged to:: Private residence Living Arrangements: Spouse/significant other Available Help at Discharge: Family;Available 24 hours/day Type of Home: House Home Access: Stairs to enter Entrance Stairs-Rails: Right Entrance Stairs-Number of Steps: 6 Home Layout: Multi-level Home Equipment: Walker - 2 wheels;Cane - single point;Bedside commode      Prior Function Level of Independence: Independent with assistive device(s)         Comments: Used SPC at times.      Hand Dominance        Extremity/Trunk Assessment   Upper Extremity Assessment: Defer to OT evaluation           Lower Extremity Assessment: Generalized weakness         Communication   Communication: No difficulties  Cognition Arousal/Alertness: Awake/alert Behavior During Therapy: WFL for tasks assessed/performed Overall Cognitive Status: Within Functional Limits for tasks assessed  General Comments      Exercises        Assessment/Plan    PT Assessment Patient needs continued PT services  PT Diagnosis Difficulty walking;Generalized weakness   PT Problem List Decreased mobility;Decreased balance;Decreased  activity tolerance;Decreased strength  PT Treatment Interventions Balance training;Gait training;Patient/family education;Functional mobility training;Therapeutic activities;Therapeutic exercise;Stair training   PT Goals (Current goals can be found in the Care Plan section) Acute Rehab PT Goals Patient Stated Goal: to return to independence PT Goal Formulation: With patient Time For Goal Achievement: 04/21/14 Potential to Achieve Goals: Good    Frequency Min 3X/week   Barriers to discharge        Co-evaluation               End of Session Equipment Utilized During Treatment: Gait belt Activity Tolerance: Patient tolerated treatment well Patient left: in chair;with call bell/phone within reach Nurse Communication: Mobility status         Time: 1440-1510 PT Time Calculation (min) (ACUTE ONLY): 30 min   Charges:   PT Evaluation $Initial PT Evaluation Tier I: 1 Procedure PT Treatments $Gait Training: 8-22 mins   PT G CodesCandy Sledge A 2014-04-24, 3:26 PM  Candy Sledge, [place], DPT [phone]
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
## 2014-04-07 NOTE — Evaluation (Signed)
Occupational Therapy Evaluation Patient Details Name: [name] MRN: [id number] DOB: 1950/10/22 Today's Date: 04/07/2014    History of Present Illness Patient is a 64 y/o female s/p exploratory laparotomy, hernia repair and small bowel resection POD 1.    Clinical Impression   PTA pt lived at home and was independent with use of a cane PRN. Pt currently limited by "pressure" on her abdomen and decreased mobility which impair her independence with functional mobility and ADLs. Pt will benefit from acute OT to promote independence with ADLs.     Follow Up Recommendations  No OT follow up;Supervision/Assistance - 24 hour    Equipment Recommendations  None recommended by OT    Recommendations for Other Services       Precautions / Restrictions Precautions Precautions: Fall Precaution Comments: NG tube Restrictions Weight Bearing Restrictions: No      Mobility Bed Mobility Overal bed mobility: Needs Assistance Bed Mobility: Rolling;Sit to Sidelying Rolling: Min guard Sidelying to sit: HOB elevated;Min assist     Sit to sidelying: Min assist General bed mobility comments: Min A to assist LEs onto bed. Cues for log roll.   Transfers Overall transfer level: Needs assistance Equipment used: Rolling walker (2 wheeled) Transfers: Sit to/from [facility] Sit to Stand: Min guard Stand pivot transfers: Min guard       General transfer comment: Min guard to rise. Good hand placement.     Balance Overall balance assessment: Needs assistance Sitting-balance support: Feet supported;No upper extremity supported Sitting balance-Leahy Scale: Good     Standing balance support: During functional activity Standing balance-Leahy Scale: Poor Standing balance comment: Relient on RW                            ADL Overall ADL's : Needs assistance/impaired Eating/Feeding: NPO Eating/Feeding Details (indicate cue type and reason): NG  tube Grooming: Set up;Sitting   Upper Body Bathing: Set up;Sitting   Lower Body Bathing: Minimal assistance;Sit to/from stand   Upper Body Dressing : Set up;Sitting   Lower Body Dressing: Moderate assistance;Sit to/from stand   Toilet Transfer: Min guard;Ambulation;RW Toilet Transfer Details (indicate cue type and reason): recliner>bed         Functional mobility during ADLs: Min guard;Rolling walker General ADL Comments: Pt with limited ability to reach LEs due to lines and "pressure" on abdomen.      Vision Additional Comments: Pt reports no change from baseline.           Pertinent Vitals/Pain Pain Assessment: No/denies pain ("abdominal pressure") Faces Pain Scale: No hurt (just pressure in abdomen) Pain Location: abdomen Pain Descriptors / Indicators: Pressure Pain Intervention(s): Monitored during session;PCA encouraged     Hand Dominance     Extremity/Trunk Assessment Upper Extremity Assessment Upper Extremity Assessment: Overall WFL for tasks assessed   Lower Extremity Assessment Lower Extremity Assessment: Generalized weakness   Cervical / Trunk Assessment Cervical / Trunk Assessment: Normal   Communication Communication Communication: No difficulties   Cognition Arousal/Alertness: Awake/alert Behavior During Therapy: WFL for tasks assessed/performed Overall Cognitive Status: Within Functional Limits for tasks assessed                                Home Living Family/patient expects to be discharged to:: Private residence Living Arrangements: Spouse/significant other Available Help at Discharge: Family;Available 24 hours/day Type of Home: House Home Access: Stairs to enter Entrance  Stairs-Number of Steps: 6 Entrance Stairs-Rails: Right Home Layout: Multi-level Alternate Level Stairs-Number of Steps: 6 Alternate Level Stairs-Rails: Right           Home Equipment: Walker - 2 wheels;Cane - single point;Bedside commode           Prior Functioning/Environment Level of Independence: Independent with assistive device(s)        Comments: Used SPC at times.     OT Diagnosis: Generalized weakness;Acute pain   OT Problem List: Decreased strength;Decreased range of motion;Decreased activity tolerance;Impaired balance (sitting and/or standing);Decreased knowledge of use of DME or AE;Decreased knowledge of precautions;Pain   OT Treatment/Interventions: Self-care/ADL training;Therapeutic exercise;Energy conservation;DME and/or AE instruction;Therapeutic activities;Patient/family education;Balance training    OT Goals(Current goals can be found in the care plan section) Acute Rehab OT Goals Patient Stated Goal: to return to independence OT Goal Formulation: With patient Time For Goal Achievement: 04/21/14 Potential to Achieve Goals: Good ADL Goals Pt Will Perform Grooming: with supervision;standing Pt Will Perform Lower Body Bathing: with set-up;with supervision;sit to/from stand Pt Will Perform Lower Body Dressing: with set-up;with supervision;sit to/from stand Pt Will Transfer to Toilet: with supervision;ambulating Pt Will Perform Toileting - Clothing Manipulation and hygiene: with supervision;sit to/from stand  OT Frequency: Min 2X/week    End of Session Equipment Utilized During Treatment: Rolling walker;Oxygen Nurse Communication: Mobility status  Activity Tolerance: Patient tolerated treatment well Patient left: in bed;with call bell/phone within reach;with bed alarm set;with family/visitor present   Time: 1645-1710 OT Time Calculation (min): 25 min Charges:  OT General Charges $OT Visit: 1 Procedure OT Evaluation $Initial OT Evaluation Tier I: 1 Procedure OT Treatments $Self Care/Home Management : 8-22 mins G-Codes:    Juluis Rainier 2014-04-29, 5:31 PM  Secundino Ginger Lynetta Mare, OTR/L Occupational Therapist [phone] (pager)

## 2014-04-07 NOTE — Progress Notes (Signed)
1 Day Post-Op  Subjective: The patient is in her usual good spirits Pain seems to be well-controlled with PCA pump at current settings No flatus Thin greenish NG output - tube functioning well  Objective: Vital signs in last 24 hours: Temp:  [97.5 F (36.4 C)-98.2 F (36.8 C)] 98.1 F (36.7 C) (03/10 0548) Pulse Rate:  [47-68] 66 (03/10 0548) Resp:  [8-24] 13 (03/10 0753) BP: (101-123)/(52-71) 103/63 mmHg (03/10 0548) SpO2:  [65 %-100 %] 99 % (03/10 0753) FiO2 (%):  [100 %] 100 % (03/10 0410) Last BM Date: 04/03/14  Intake/Output from previous day: 03/09 0701 - 03/10 0700 In: 1190 [P.O.:60; I.V.:1100; NG/GT:30] Out: 2035 [Urine:1885; Emesis/NG output:100; Blood:50] Intake/Output this shift:    General appearance: alert, cooperative and no distress Resp: clear to auscultation bilaterally Cardio: regular rate and rhythm, S1, S2 normal, no murmur, click, rub or gallop GI: soft, incisional tenderness; minimal bowel sounds Dressing clean and dry  Lab Results: Today's labs are pending  Recent Labs  04/04/14 1041 04/06/14 1435  WBC 8.5 10.6*  HGB 9.8* 9.4*  HCT 30.5* 30.4*  PLT 434* 404*   BMET  Recent Labs  04/04/14 1041 04/06/14 1435  NA 135  --   K 3.7  --   CL 102  --   CO2 26  --   GLUCOSE 106*  --   BUN 11  --   CREATININE 0.74 0.86  CALCIUM 8.9  --    PT/INR No results for input(s): LABPROT, INR in the last 72 hours. ABG No results for input(s): PHART, HCO3 in the last 72 hours.  Invalid input(s): PCO2, PO2  Studies/Results: No results found.  Anti-infectives: Anti-infectives    Start     Dose/Rate Route Frequency Ordered Stop   04/06/14 1500  cefOXitin (MEFOXIN) 1 g in dextrose 5 % 50 mL IVPB     1 g 100 mL/hr over 30 Minutes Intravenous Every 6 hours 04/06/14 1305 04/07/14 0425   04/06/14 0600  cefOXitin (MEFOXIN) 2 g in dextrose 5 % 50 mL IVPB     2 g 100 mL/hr over 30 Minutes Intravenous On call to O.R. 04/05/14 1256 04/06/14 0840       Assessment/Plan: s/p Procedure(s): EXPLORATORY LAPAROTOMY (N/A) SMALL BOWEL RESECTION (N/A) HERNIA REPAIR VENTRAL ADULT (N/A) Awaiting bowel function - continue NG tube, IV fluids, ice chips  PT/ OT for early mobilization.  She had a very difficult time with mobilization after surgery last year and required SNF placement.  Hopefully, we can avoid that this time.   LOS: 1 day    Yedidya Duddy K. 04/07/2014

## 2014-04-08 LAB — CBC
HCT: 28.3 % — ABNORMAL LOW (ref 36.0–46.0)
Hemoglobin: 8.8 g/dL — ABNORMAL LOW (ref 12.0–15.0)
MCH: 21.5 pg — AB (ref 26.0–34.0)
MCHC: 31.1 g/dL (ref 30.0–36.0)
MCV: 69.2 fL — ABNORMAL LOW (ref 78.0–100.0)
Platelets: 416 10*3/uL — ABNORMAL HIGH (ref 150–400)
RBC: 4.09 MIL/uL (ref 3.87–5.11)
RDW: 16.8 % — ABNORMAL HIGH (ref 11.5–15.5)
WBC: 8.4 10*3/uL (ref 4.0–10.5)

## 2014-04-08 LAB — BASIC METABOLIC PANEL
ANION GAP: 7 (ref 5–15)
BUN: <5 mg/dL — ABNORMAL LOW (ref 6–23)
CHLORIDE: 104 mmol/L (ref 96–112)
CO2: 29 mmol/L (ref 19–32)
CREATININE: 0.75 mg/dL (ref 0.50–1.10)
Calcium: 8.9 mg/dL (ref 8.4–10.5)
GFR calc Af Amer: >90 mL/min (ref >90)
GFR calc non Af Amer: 88 mL/min — ABNORMAL LOW (ref >90)
Glucose, Bld: 83 mg/dL (ref 70–99)
Potassium: 4.3 mmol/L (ref 3.5–5.1)
SODIUM: 140 mmol/L (ref 135–145)

## 2014-04-08 MED ORDER — MORPHINE SULFATE 2 MG/ML IJ SOLN
2.0000 mg | INTRAMUSCULAR | Status: DC | PRN
  Administered 2014-04-09 – 2014-04-10 (×3): 2 mg via INTRAVENOUS
  Filled 2014-04-08 (×3): qty 1

## 2014-04-08 NOTE — Progress Notes (Signed)
PT Cancellation Note  Patient Details Name: [name] MRN: [id number] DOB: [birth year]   Cancelled Treatment:    Reason Eval/Treat Not Completed: Patient declined, no reason specified Pt sleeping in bed and declined working with therapy until later. "If you don't come later, I will just get up with nursing maybe around 2 or 3." Will follow up next available time.   Candy Sledge A 04/08/2014, 12:08 PM  Candy Sledge, [place], DPT [phone]

## 2014-04-08 NOTE — Progress Notes (Signed)
Physical Therapy Treatment Patient Details Name: [name] MRN: [id number] DOB: 04-09-1950 Today's Date: 04/08/2014    History of Present Illness Patient is a 64 y/o female s/p exploratory laparotomy, hernia repair and small bowel resection POD 1.     PT Comments    Patient agreeable to ambulation this afternoon and very appreciative. Moving well but requires increased time for all mobility. Continue with current POC  Follow Up Recommendations  Home health PT;Supervision/Assistance - 24 hour     Equipment Recommendations  None recommended by PT    Recommendations for Other Services       Precautions / Restrictions Precautions Precautions: Fall Precaution Comments: NG tube Restrictions Weight Bearing Restrictions: No    Mobility  Bed Mobility               General bed mobility comments: UP in recliner before and after session  Transfers Overall transfer level: Needs assistance Equipment used: Rolling walker (2 wheeled)   Sit to Stand: Supervision         General transfer comment: Patient with safe technique  Ambulation/Gait Ambulation/Gait assistance: Min guard Ambulation Distance (Feet): 90 Feet Assistive device: Rolling walker (2 wheeled) Gait Pattern/deviations: Step-through pattern;Decreased stride length;Wide base of support   Gait velocity interpretation: Below normal speed for age/gender General Gait Details: Pt with very slow, guarded gait. Safe use of RW   Stairs            Wheelchair Mobility    Modified Rankin (Stroke Patients Only)       Balance                                    Cognition Arousal/Alertness: Awake/alert Behavior During Therapy: WFL for tasks assessed/performed Overall Cognitive Status: Within Functional Limits for tasks assessed                      Exercises      General Comments        Pertinent Vitals/Pain Pain Assessment: No/denies pain    Home Living                       Prior Function            PT Goals (current goals can now be found in the care plan section) Progress towards PT goals: Progressing toward goals    Frequency  Min 3X/week    PT Plan Current plan remains appropriate    Co-evaluation             End of Session   Activity Tolerance: Patient tolerated treatment well Patient left: in chair;with call bell/phone within reach     Time: 1358-1425 PT Time Calculation (min) (ACUTE ONLY): 27 min  Charges:  $Gait Training: 8-22 mins $Therapeutic Activity: 8-22 mins                    G Codes:      Jacqualyn Posey 04/08/2014, 2:27 PM 04/08/2014 Jacqualyn Posey PTA [phone] pager [phone] office

## 2014-04-08 NOTE — Discharge Instructions (Addendum)
Home health PT will be provided by [facility] [phone] [facility], [place] [phone]  OPEN ABDOMINAL SURGERY: POST OP INSTRUCTIONS  Always review your discharge instruction sheet given to you by the facility where your surgery was performed.  IF YOU HAVE DISABILITY OR FAMILY LEAVE FORMS, YOU MUST BRING THEM TO THE OFFICE FOR PROCESSING.  PLEASE DO NOT GIVE THEM TO YOUR DOCTOR.  1. A prescription for pain medication may be given to you upon discharge.  Take your pain medication as prescribed, if needed.  If narcotic pain medicine is not needed, then you may take acetaminophen (Tylenol) or ibuprofen (Advil) as needed. 2. Take your usually prescribed medications unless otherwise directed. 3. If you need a refill on your pain medication, please contact your pharmacy. They will contact our office to request authorization.  Prescriptions will not be filled after 5pm or on week-ends. 4. You should follow a light diet the first few days after arrival home, such as soup and crackers, pudding, etc.unless your doctor has advised otherwise. A high-fiber, low fat diet can be resumed as tolerated.   Be sure to include lots of fluids daily. Most patients will experience some swelling and bruising on the chest and neck area.  Ice packs will help.  Swelling and bruising can take several days to resolve 5. Most patients will experience some swelling and bruising in the area of the incision. Ice pack will help. Swelling and bruising can take several days to resolve..  6. It is common to experience some constipation if taking pain medication after surgery.  Increasing fluid intake and taking a stool softener will usually help or prevent this problem from occurring.  A mild laxative (Milk of Magnesia or Miralax) should be taken according to package directions if there are no bowel movements after 48 hours. 7.  You may have steri-strips (small skin tapes) in place directly over the incision.   These strips should be left on the skin for 7-10 days.  If your surgeon used skin glue on the incision, you may shower in 24 hours.  The glue will flake off over the next 2-3 weeks.  Any sutures or staples will be removed at the office during your follow-up visit. You may find that a light gauze bandage over your incision may keep your staples from being rubbed or pulled. You may shower and replace the bandage daily. 8. ACTIVITIES:  You may resume regular (light) daily activities beginning the next day--such as daily self-care, walking, climbing stairs--gradually increasing activities as tolerated.  You may have sexual intercourse when it is comfortable.  Refrain from any heavy lifting or straining until approved by your doctor. a. You may drive when you no longer are taking prescription pain medication, you can comfortably wear a seatbelt, and you can safely maneuver your car and apply brakes b. Return to Work: ___________________________________ 30. You should see your doctor in the office for a follow-up appointment approximately two weeks after your surgery.  Make sure that you call for this appointment within a day or two after you arrive home to insure a convenient appointment time. OTHER INSTRUCTIONS:  _____________________________________________________________ _____________________________________________________________  WHEN TO CALL YOUR DOCTOR: 1. Fever over 101.0 2. Inability to urinate 3. Nausea and/or vomiting 4. Extreme swelling or bruising 5. Continued bleeding from incision. 6. Increased pain, redness, or drainage from the incision. 7. Difficulty swallowing or breathing 8. Muscle cramping or spasms. 9. Numbness or tingling in  hands or feet or around lips.  The clinic staff is available to answer your questions during regular business hours.  Please dont hesitate to call and ask to speak to one of the nurses if you have concerns.  For further questions, please visit  [email or web address]

## 2014-04-08 NOTE — Care Management Note (Signed)
  Page 1 of 1   04/11/2014     8:17:10 AM CARE MANAGEMENT NOTE 04/11/2014  Patient:  [name], [name]   Account Number:  [id number]  Date Initiated:  04/08/2014  Documentation initiated by:  Magdalen Spatz  Subjective/Objective Assessment:     Action/Plan:   Anticipated DC Date:  04/12/2014   Anticipated DC Plan:  [place]         Choice offered to / List presented to:  C-1 Patient        [facility] arranged  HH-2 PT      [facility] agency  [facility]   Status of service:  Completed, signed off Medicare Important Message given?  YES (If response is "NO", the following Medicare IM given date fields will be blank) Date Medicare IM given:  04/08/2014 Medicare IM given by:  Magdalen Spatz Date Additional Medicare IM given:  04/11/2014 Additional Medicare IM given by:  Magdalen Spatz  Discharge Disposition:  [place]  Per UR Regulation:  Reviewed for med. necessity/level of care/duration of stay  If discussed at [place] of Stay Meetings, dates discussed:    Comments:  04-11-14 UR updated. Magdalen Spatz RN BSN    04-08-14 Called referral to [facility] at [facility] , and  faxed  [phone] .  On day of discharge [facility] needs to be notified and discharged summary faxed .  Patient already has walker and 3 in 1 at home .     Magdalen Spatz RN BSN

## 2014-04-08 NOTE — Progress Notes (Signed)
2 Days Post-Op  Subjective: No reports of flatus or BM NG 900 cc output Foley is out - has not voided yet Worked with PT and OT yesterday - will need HH PT  Objective: Vital signs in last 24 hours: Temp:  [98 F (36.7 C)-99 F (37.2 C)] 99 F (37.2 C) (03/11 0514) Pulse Rate:  [65-78] 70 (03/11 0514) Resp:  [15-24] 15 (03/11 0742) BP: (102-108)/(50-65) 108/65 mmHg (03/11 0514) SpO2:  [0 %-100 %] 93 % (03/11 0742) Last BM Date: 04/04/14  Intake/Output from previous day: 03/10 0701 - 03/11 0700 In: 6531.7 [P.O.:60; I.V.:6471.7] Out: 3200 [Urine:2300; Emesis/NG output:900] Intake/Output this shift:    General appearance: alert, cooperative and no distress Resp: clear to auscultation bilaterally Cardio: regular rate and rhythm, S1, S2 normal, no murmur, click, rub or gallop GI: soft, minimal distention; minimal bowel sounds; incisional tenderness Staple line c/d/i  Lab Results:   Recent Labs  04/07/14 0704 04/08/14 0420  WBC 12.8* 8.4  HGB 8.4* 8.8*  HCT 27.4* 28.3*  PLT 399 416*   BMET  Recent Labs  04/07/14 0704 04/08/14 0420  NA 138 140  K 4.3 4.3  CL 104 104  CO2 27 29  GLUCOSE 105* 83  BUN 6 <5*  CREATININE 0.67 0.75  CALCIUM 8.7 8.9   PT/INR No results for input(s): LABPROT, INR in the last 72 hours. ABG No results for input(s): PHART, HCO3 in the last 72 hours.  Invalid input(s): PCO2, PO2  Studies/Results: No results found.  Anti-infectives: Anti-infectives    Start     Dose/Rate Route Frequency Ordered Stop   04/06/14 1500  cefOXitin (MEFOXIN) 1 g in dextrose 5 % 50 mL IVPB     1 g 100 mL/hr over 30 Minutes Intravenous Every 6 hours 04/06/14 1305 04/07/14 0425   04/06/14 0600  cefOXitin (MEFOXIN) 2 g in dextrose 5 % 50 mL IVPB     2 g 100 mL/hr over 30 Minutes Intravenous On call to O.R. 04/05/14 1256 04/06/14 0840      Assessment/Plan: s/p Procedure(s): EXPLORATORY LAPAROTOMY (N/A) SMALL BOWEL RESECTION (N/A) HERNIA REPAIR  VENTRAL ADULT (N/A) Awaiting the return of bowel function  Continue NG tube Continue PT/ OT Case manager - HHPT Pathology pending   LOS: 2 days    Theia Dezeeuw K. 04/08/2014

## 2014-04-08 NOTE — Anesthesia Postprocedure Evaluation (Signed)
  Anesthesia Post-op Note  Patient: [name]  Procedure(s) Performed: Procedure(s) (LRB): EXPLORATORY LAPAROTOMY (N/A) SMALL BOWEL RESECTION (N/A) HERNIA REPAIR VENTRAL ADULT (N/A)  Patient Location: PACU  Anesthesia Type: General  Level of Consciousness: awake and alert   Airway and Oxygen Therapy: Patient Spontanous Breathing  Post-op Pain: mild  Post-op Assessment: Post-op Vital signs reviewed, Patient's Cardiovascular Status Stable, Respiratory Function Stable, Patent Airway and No signs of Nausea or vomiting  Last Vitals:  Filed Vitals:   04/07/14 2328  BP:   Pulse:   Temp:   Resp: 16    Post-op Vital Signs: stable   Complications: No apparent anesthesia complications

## 2014-04-09 NOTE — Progress Notes (Signed)
3 Days Post-Op  Subjective: Feels gas moving thru but no flatus. Walked in halls. Pain ok.   Objective: Vital signs in last 24 hours: Temp:  [97.6 F (36.4 C)-98.4 F (36.9 C)] 97.6 F (36.4 C) (03/12 0519) Pulse Rate:  [68-97] 97 (03/12 0519) Resp:  [15-22] 19 (03/12 0519) BP: (103-130)/(57-81) 130/80 mmHg (03/12 0519) SpO2:  [96 %-100 %] 100 % (03/12 0519) Last BM Date: 04/04/14  Intake/Output from previous day: 03/11 0701 - 03/12 0700 In: 2400 [I.V.:2400] Out: 1000 [Emesis/NG output:1000] Intake/Output this shift:    Alert, nontoxic, not ill appeaing cta b/l Reg Soft, obese, approp TTP, dressing c/d/i No edema IS 1200  Lab Results:   Recent Labs  04/07/14 0704 04/08/14 0420  WBC 12.8* 8.4  HGB 8.4* 8.8*  HCT 27.4* 28.3*  PLT 399 416*   BMET  Recent Labs  04/07/14 0704 04/08/14 0420  NA 138 140  K 4.3 4.3  CL 104 104  CO2 27 29  GLUCOSE 105* 83  BUN 6 <5*  CREATININE 0.67 0.75  CALCIUM 8.7 8.9   PT/INR No results for input(s): LABPROT, INR in the last 72 hours. ABG No results for input(s): PHART, HCO3 in the last 72 hours.  Invalid input(s): PCO2, PO2  Studies/Results: No results found.  Anti-infectives: Anti-infectives    Start     Dose/Rate Route Frequency Ordered Stop   04/06/14 1500  cefOXitin (MEFOXIN) 1 g in dextrose 5 % 50 mL IVPB     1 g 100 mL/hr over 30 Minutes Intravenous Every 6 hours 04/06/14 1305 04/07/14 0425   04/06/14 0600  cefOXitin (MEFOXIN) 2 g in dextrose 5 % 50 mL IVPB     2 g 100 mL/hr over 30 Minutes Intravenous On call to O.R. 04/05/14 1256 04/06/14 0840      Assessment/Plan: s/p Procedure(s): EXPLORATORY LAPAROTOMY (N/A) SMALL BOWEL RESECTION (N/A) HERNIA REPAIR VENTRAL ADULT (N/A)  Cont pulm toilet, IS, oob, ambulate BP ok Cont VTE prophylaxis Cont NG tube for now; had 1L/24hrs.  Repeat bmet in am to monitor lytes  Leighton Ruff. Redmond Pulling, MD, FACS General, Bariatric, & Minimally Invasive Surgery [facility], [place]   LOS: 3 days    Gayland Curry 04/09/2014

## 2014-04-10 LAB — BASIC METABOLIC PANEL
Anion gap: 9 (ref 5–15)
CALCIUM: 9.2 mg/dL (ref 8.4–10.5)
CO2: 29 mmol/L (ref 19–32)
Chloride: 104 mmol/L (ref 96–112)
Creatinine, Ser: 0.75 mg/dL (ref 0.50–1.10)
GFR calc non Af Amer: 88 mL/min — ABNORMAL LOW (ref >90)
Glucose, Bld: 72 mg/dL (ref 70–99)
Potassium: 3.9 mmol/L (ref 3.5–5.1)
Sodium: 142 mmol/L (ref 135–145)

## 2014-04-10 MED ORDER — OXYCODONE-ACETAMINOPHEN 5-325 MG PO TABS
1.0000 | ORAL_TABLET | ORAL | Status: DC | PRN
  Administered 2014-04-10: 1 via ORAL
  Administered 2014-04-11: 2 via ORAL
  Administered 2014-04-11: 1 via ORAL
  Filled 2014-04-10: qty 1
  Filled 2014-04-10: qty 2
  Filled 2014-04-10: qty 1

## 2014-04-10 NOTE — Progress Notes (Signed)
Patient ID: [name], female   DOB: Nov 30, 1950, 64 y.o.   MRN: [id number]     [facility]      [address] [place], [place] [address]    Phone: [phone] FAX: [phone]     Subjective: Starting passing yesterday.  VSS.  Afebrile.   Objective:  Vital signs:  Filed Vitals:   04/09/14 2122 04/09/14 2139 04/10/14 0656 04/10/14 0814  BP:  102/60 111/68   Pulse:  62 61   Temp:  98.4 F (36.9 C) 98.8 F (37.1 C)   TempSrc:  Oral    Resp:  16 15   Height:      Weight:      SpO2: 100% 100% 99% 99%    Last BM Date:  (pre op)  Intake/Output   Yesterday:  03/12 0701 - 03/13 0700 In: 2205 [I.V.:2205] Out: 1000 [Emesis/NG output:1000] This shift:    I/O last 3 completed shifts: In: 3705 [I.V.:3705] Out: 2200 [Emesis/NG output:2200]    Physical Exam: General: Pt awake/alert/oriented x4 in no acute distress Chest: cta. No chest wall pain w good excursion CV:  Pulses intact.  Regular rhythm MS: Normal AROM mjr joints.  No obvious deformity Abdomen: Soft.  Nondistended.   Mildly tender at incision only.  Dressing removed, staples in place with approximated edges, no erythema or drainage. No evidence of peritonitis.  No incarcerated hernias. Ext:  SCDs BLE.  No mjr edema.  No cyanosis Skin: No petechiae / purpura   Problem List:   Active Problems:   Lymphoma of small bowel    Results:   Labs: Results for orders placed or performed during the hospital encounter of 04/06/14 (from the past 48 hour(s))  Basic metabolic panel     Status: Abnormal   Collection Time: 04/10/14  5:36 AM  Result Value Ref Range   Sodium 142 135 - 145 mmol/L   Potassium 3.9 3.5 - 5.1 mmol/L   Chloride 104 96 - 112 mmol/L   CO2 29 19 - 32 mmol/L   Glucose, Bld 72 70 - 99 mg/dL   BUN <5 (L) 6 - 23 mg/dL   Creatinine, Ser 0.75 0.50 - 1.10 mg/dL   Calcium 9.2 8.4 - 10.5 mg/dL   GFR calc non Af Amer 88 (L) >90 mL/min   GFR calc Af  Amer >90 >90 mL/min    Comment: (NOTE) The eGFR has been calculated using the CKD EPI equation. This calculation has not been validated in all clinical situations. eGFR's persistently <90 mL/min signify possible Chronic Kidney Disease.    Anion gap 9 5 - 15    Imaging / Studies: No results found.  Medications / Allergies:  Scheduled Meds: . enoxaparin (LOVENOX) injection  40 mg Subcutaneous Q24H  . irbesartan  150 mg Oral Daily   And  . hydrochlorothiazide  25 mg Oral Daily  . loratadine  10 mg Oral Daily  . mometasone-formoterol  2 puff Inhalation BID  . montelukast  10 mg Oral QHS   Continuous Infusions: . 0.9 % NaCl with KCl 20 mEq / L 1 mL (04/10/14 0310)   PRN Meds:.albuterol, morphine injection, ondansetron **OR** ondansetron (ZOFRAN) IV  Antibiotics: Anti-infectives    Start     Dose/Rate Route Frequency Ordered Stop   04/06/14 1500  cefOXitin (MEFOXIN) 1 g in dextrose 5 % 50 mL IVPB     1 g 100 mL/hr over 30 Minutes Intravenous Every 6 hours 04/06/14 1305  04/07/14 0425   04/06/14 0600  cefOXitin (MEFOXIN) 2 g in dextrose 5 % 50 mL IVPB     2 g 100 mL/hr over 30 Minutes Intravenous On call to O.R. 04/05/14 1256 04/06/14 0840        Assessment/Plan POD#4 Exploratory laparotomy, lysis of adhesions, small bowel resection, primary repair of 2 ventral incisional hernias---Dr. Georgette Dover -clamp NGT and allow for sips of clear today.  If able to tolerate, DC NGT in AM and give clears ad lib -add po pain meds, IV for breakthrough -IS -SCD/lovenox -mobilize -VF -pathology-invasive adenocarcinoma with mets lymph nodes.  MD to discuss with the patient today HTN -home meds  Erby Pian, ANP-BC [facility]   04/10/2014 8:52 AM   The patient just passed a good BM after taking POs.  Will discontinue NG.  Path not discussed with her.    Matt B. Hassell Done, MD, [facility] [phone] beeper [phone]  04/10/2014 11:54 AM

## 2014-04-11 ENCOUNTER — Encounter: Payer: Self-pay | Admitting: Internal Medicine

## 2014-04-11 ENCOUNTER — Ambulatory Visit: Payer: Medicare Other

## 2014-04-11 MED ORDER — ENSURE COMPLETE PO LIQD
237.0000 mL | Freq: Two times a day (BID) | ORAL | Status: DC
  Administered 2014-04-12: 237 mL via ORAL

## 2014-04-11 NOTE — Progress Notes (Addendum)
Occupational Therapy Treatment Patient Details Name: [name] MRN: [id number] DOB: 1950/03/28 Today's Date: 04/11/2014    History of present illness Patient is a 64 y/o female s/p exploratory laparotomy, hernia repair and small bowel resection POD 1.    OT comments  Pt progressing. Education provided in session.   Follow Up Recommendations  No OT follow up;Supervision/Assistance - 24 hour    Equipment Recommendations  None recommended by OT    Recommendations for Other Services      Precautions / Restrictions Precautions Precautions: Fall Restrictions Weight Bearing Restrictions: No       Mobility Bed Mobility Overal bed mobility: Needs Assistance Bed Mobility: Rolling;Sidelying to Sit Rolling: Supervision Sidelying to sit: Supervision       General bed mobility comments: cues for pt to use mattress instead of rail, but no physical assist needed.  Transfers Overall transfer level: Needs assistance Equipment used: None Transfers: Sit to/from Stand Sit to Stand: Supervision            Balance Min guard for ambulation.                 ADL Overall ADL's : Needs assistance/impaired                 Upper Body Dressing : Supervision/safety;Standing   Lower Body Dressing: Set up;Sitting/lateral leans (donned/doffed socks)-dropped sock and OT retrieved for pt   Toilet Transfer: Min guard;Ambulation (chair/bed)       Tub/ Shower Transfer: Min guard;Tub transfer;Ambulation;3 in 1   Functional mobility during ADLs: Min guard General ADL Comments: Educated on safety such as safe shoewear, rugs/items on floor, sitting for LB ADLs, and recommended someone be with her for tub transfer. Recommended pt not get up unless spouse is at home. Explained benefit of moving/therapy. Educated on tub transfer technique and how to use 3 in 1 as shower chair. Explained that reacher could be used to pick up items.      Vision                      Perception     Praxis      Cognition  Awake/Alert Behavior During Therapy: WFL for tasks assessed/performed Overall Cognitive Status: Within Functional Limits for tasks assessed                       Extremity/Trunk Assessment               Exercises     Shoulder Instructions       General Comments      Pertinent Vitals/ Pain       Pain Assessment: No/denies pain  Home Living                                          Prior Functioning/Environment              Frequency Min 2X/week     Progress Toward Goals  OT Goals(current goals can now be found in the care plan section)  Progress towards OT goals: Progressing toward goals  Acute Rehab OT Goals Patient Stated Goal: not stated OT Goal Formulation: With patient Time For Goal Achievement: 04/21/14 Potential to Achieve Goals: Good ADL Goals Pt Will Perform Grooming: with supervision;standing Pt Will Perform Lower Body Bathing: with set-up;with supervision;sit to/from stand Pt Will Perform Lower  Body Dressing: with set-up;with supervision;sit to/from stand Pt Will Transfer to Toilet: with supervision;ambulating Pt Will Perform Toileting - Clothing Manipulation and hygiene: with supervision;sit to/from stand Pt Will Perform Tub/Shower Transfer: Tub transfer;with supervision;ambulating;3 in 1  Plan Discharge plan remains appropriate    Co-evaluation                 End of Session Equipment Utilized During Treatment: Gait belt   Activity Tolerance Patient tolerated treatment well   Patient Left in chair;with call bell/phone within reach   Nurse Communication          Time: 2482-5003 OT Time Calculation (min): 19 min  Charges: OT General Charges $OT Visit: 1 Procedure OT Treatments $Self Care/Home Management : 8-22 mins  Benito Mccreedy OTR/L [phone] 04/11/2014, 5:17 PM

## 2014-04-11 NOTE — Progress Notes (Signed)
Physical Therapy Treatment Patient Details Name: [name] MRN: [id number] DOB: [birth year] Today's Date: 04/11/2014    History of Present Illness Patient is a 64 y/o female s/p exploratory laparotomy, hernia repair and small bowel resection POD 1.     PT Comments    Patient progressing well with mobility. Tolerated gait training without use of RW (used IV pole) however continues to have slow and guarded gait. Tolerated stair negotiation with Min guard assist for safety. Pt will have 24/7 S at home from spouse and family. Will continue to follow per current POC.   Follow Up Recommendations  Home health PT;Supervision/Assistance - 24 hour     Equipment Recommendations  None recommended by PT    Recommendations for Other Services       Precautions / Restrictions Precautions Precautions: Fall Restrictions Weight Bearing Restrictions: No    Mobility  Bed Mobility               General bed mobility comments: Standing in room after leaving bathroom upon PT arrival.   Transfers Overall transfer level: Needs assistance Equipment used: None Transfers: Sit to/from Stand Sit to Stand: Min guard         General transfer comment: Safe technique. Stood from toilet x1, from chair x2.  Ambulation/Gait Ambulation/Gait assistance: Min guard Ambulation Distance (Feet): 100 Feet Assistive device:  (IV pole) Gait Pattern/deviations: Step-through pattern;Decreased stride length   Gait velocity interpretation: Below normal speed for age/gender General Gait Details: Pt with very slow, guarded gait. Holding onto IV pole for support.   Stairs Stairs: Yes Stairs assistance: Min guard Stair Management: One rail Right;Step to pattern Number of Stairs: 6 General stair comments: Cues for technique. Min guard for safety.   Wheelchair Mobility    Modified Rankin (Stroke Patients Only)       Balance Overall balance assessment: Needs assistance Sitting-balance  support: Feet supported;No upper extremity supported Sitting balance-Leahy Scale: Good Sitting balance - Comments: Able to perform pericare without difficulty or assist reaching outside BoS.   Standing balance support: During functional activity Standing balance-Leahy Scale: Fair                      Cognition Arousal/Alertness: Awake/alert Behavior During Therapy: WFL for tasks assessed/performed Overall Cognitive Status: Within Functional Limits for tasks assessed                      Exercises      General Comments        Pertinent Vitals/Pain Pain Assessment: No/denies pain    Home Living                      Prior Function            PT Goals (current goals can now be found in the care plan section) Progress towards PT goals: Progressing toward goals    Frequency  Min 3X/week    PT Plan Current plan remains appropriate    Co-evaluation             End of Session Equipment Utilized During Treatment: Gait belt Activity Tolerance: Patient tolerated treatment well Patient left: in chair;with call bell/phone within reach     Time: 1347-1411 PT Time Calculation (min) (ACUTE ONLY): 24 min  Charges:  $Gait Training: 23-37 mins                    G Codes:  Candy Sledge A 04/11/2014, 2:37 PM Candy Sledge, [place], DPT [phone]

## 2014-04-11 NOTE — Progress Notes (Signed)
Placed patient on CPAP via auto-mode for the night.  

## 2014-04-11 NOTE — Progress Notes (Signed)
Patient stated she did not want to wear CPAP tonight.  Patient is aware to call RT if she changes her mind.

## 2014-04-11 NOTE — Progress Notes (Signed)
5 Days Post-Op  Subjective: Patient had a large BM yesterday - still passing flatus Tolerating clears.  No nausea or vomiting Pain controlled with PO's  I discussed her pathology report with her on Friday.  Objective: Vital signs in last 24 hours: Temp:  [97.9 F (36.6 C)-98.1 F (36.7 C)] 97.9 F (36.6 C) (03/14 2878) Pulse Rate:  [55-99] 55 (03/14 0633) Resp:  [16] 16 (03/14 6767) BP: (93-114)/(33-74) 93/33 mmHg (03/14 0633) SpO2:  [98 %-100 %] 100 % (03/14 2094) Last BM Date: 04/10/14  Intake/Output from previous day: 03/13 0701 - 03/14 0700 In: 600 [P.O.:600] Out: -  Intake/Output this shift:    General appearance: alert, cooperative and no distress Resp: clear to auscultation bilaterally Cardio: regular rate and rhythm, S1, S2 normal, no murmur, click, rub or gallop GI: soft, minimal tenderness; + BS Incision c/d/i  Lab Results:  No results for input(s): WBC, HGB, HCT, PLT in the last 72 hours. BMET  Recent Labs  04/10/14 0536  NA 142  K 3.9  CL 104  CO2 29  GLUCOSE 72  BUN <5*  CREATININE 0.75  CALCIUM 9.2   PT/INR No results for input(s): LABPROT, INR in the last 72 hours. ABG No results for input(s): PHART, HCO3 in the last 72 hours.  Invalid input(s): PCO2, PO2  Studies/Results: No results found.  Anti-infectives: Anti-infectives    Start     Dose/Rate Route Frequency Ordered Stop   04/06/14 1500  cefOXitin (MEFOXIN) 1 g in dextrose 5 % 50 mL IVPB     1 g 100 mL/hr over 30 Minutes Intravenous Every 6 hours 04/06/14 1305 04/07/14 0425   04/06/14 0600  cefOXitin (MEFOXIN) 2 g in dextrose 5 % 50 mL IVPB     2 g 100 mL/hr over 30 Minutes Intravenous On call to O.R. 04/05/14 1256 04/06/14 0840      Assessment/Plan: s/p Procedure(s): EXPLORATORY LAPAROTOMY (N/A) SMALL BOWEL RESECTION (N/A) HERNIA REPAIR VENTRAL ADULT (N/A) Advance diet Plan for discharge tomorrow Will need HH PT set up for discharge - probably tomorrow.  LOS: 5 days     Porter Moes K. 04/11/2014

## 2014-04-12 ENCOUNTER — Ambulatory Visit (INDEPENDENT_AMBULATORY_CARE_PROVIDER_SITE_OTHER): Payer: Medicare Other

## 2014-04-12 DIAGNOSIS — J309 Allergic rhinitis, unspecified: Secondary | ICD-10-CM | POA: Diagnosis not present

## 2014-04-12 MED ORDER — OXYCODONE-ACETAMINOPHEN 5-325 MG PO TABS: 1.0000 | ORAL_TABLET | ORAL | Status: DC | PRN

## 2014-04-12 NOTE — Discharge Summary (Signed)
Physician Discharge Summary  Patient ID: [name] MRN: [id number] DOB/AGE: 1950/08/17 64 y.o.  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  The patient had a post-operative ileus for about 3 days, and then began passing flatus.  Her NG tube was removed on 04/10/14 and her diet was slowly advanced.  She has had two bowel movements.  Consults: hematology/oncology - [name]  Significant Diagnostic Studies: labs: pathology - adenocarcinoma  Treatments: surgery: see above  Discharge Exam: Blood pressure 120/69, pulse 69, temperature 98.3 F (36.8 C), temperature source Oral, resp. rate 18, height 5\' 2"  (1.575 m), weight 97.07 kg (214 lb), SpO2 96 %. General appearance: alert, cooperative and no distress Resp: clear to auscultation bilaterally Cardio: regular rate and rhythm, S1, S2 normal, no murmur, click, rub or gallop GI: soft, minimal tenderness Staple line c/d/i  Disposition: 06-Home-Health Care Svc  For physical therapy  Discharge Instructions    Call MD for:  persistant nausea and vomiting    Complete by:  As directed      Call MD for:  redness, tenderness, or signs of infection (pain, swelling, redness, odor or green/yellow discharge around incision site)    Complete by:  As directed      Call MD for:  severe uncontrolled pain    Complete by:  As directed      Call MD for:  temperature >100.4    Complete by:  As directed      Diet general    Complete by:  As directed      Discharge wound care:    Complete by:  As directed   You may shower.  Wear the abdominal binder as needed when out of bed.     Driving Restrictions     Complete by:  As directed   Do not drive while taking pain medications     Increase activity slowly    Complete by:  As directed      May shower / Bathe    Complete by:  As directed      May walk up steps    Complete by:  As directed             Medication List    TAKE these medications        albuterol (2.5 MG/3ML) 0.083% NEBU 3 mL, albuterol (5 MG/ML) 0.5% NEBU 0.5 mL  Inhale into the lungs 2 (two) times daily. Does treatment right before cpap used     PROAIR HFA 108 (90 BASE) MCG/ACT inhaler  Generic drug:  albuterol  INHALE 2 PUFFS INTO THE LUNGS BY MOUTH EVERY 6 HOURS AS NEEDED FOR WHEEZING/SHORTNESS OF BREATH     aspirin 81 MG tablet  Take 81 mg by mouth daily.     cetirizine 10 MG tablet  Commonly known as:  ZYRTEC  Take 1 tablet (10 mg total) by mouth daily.     ferrous sulfate 325 (65 FE) MG tablet  Take 325 mg by mouth 2 (two) times daily with a meal.     Fluticasone-Salmeterol 250-50 MCG/DOSE Aepb  Commonly known as:  ADVAIR  Inhale 1 puff into the lungs daily as needed (Shortness of breath).     furosemide  20 MG tablet  Commonly known as:  LASIX  Take 20-40 mg by mouth daily as needed for fluid.     mometasone 50 MCG/ACT nasal spray  Commonly known as:  NASONEX  Place 2 sprays into the nose daily as needed (allergies).     montelukast 10 MG tablet  Commonly known as:  SINGULAIR  Take 1 tablet (10 mg total) by mouth at bedtime.     omeprazole 20 MG capsule  Commonly known as:  PRILOSEC  Take 20 mg by mouth daily.     oxyCODONE-acetaminophen 5-325 MG per tablet  Commonly known as:  PERCOCET/ROXICET  Take 1 tablet by mouth every 4 (four) hours as needed for moderate pain.     potassium chloride 10 MEQ tablet  Commonly known as:  K-DUR  Take 1 tablet (10 mEq total) by mouth daily.     PRESCRIPTION MEDICATION  once a week. Allergy shots     valsartan-hydrochlorothiazide 160-25 MG per tablet  Commonly known as:  DIOVAN-HCT  Take 1 tablet by  mouth daily.     Vitamin B-12 1000 MCG Subl  Place 1 tablet (1,000 mcg total) under the tongue daily.     Vitamin D 1000 UNITS capsule  Take 1,000 Units by mouth every morning.           Follow-up Information    Follow up with Maia Petties., MD. Schedule an appointment as soon as possible for a visit in 1 week.   Specialty:  General Surgery   Why:  For suture removal   Contact information:   [address] [place] [address] [phone]       Signed: Maia Petties. 04/12/2014, 6:46 AM

## 2014-04-12 NOTE — Progress Notes (Signed)
Discharge paperwork given to patient. Prescription given to patient. Patient ready for discharge.

## 2014-04-12 NOTE — Care Management (Signed)
Horris Latino at [facility] aware patient discharging today . Discharge summary faxed. Magdalen Spatz RN BSN

## 2014-04-13 DIAGNOSIS — R269 Unspecified abnormalities of gait and mobility: Secondary | ICD-10-CM | POA: Diagnosis not present

## 2014-04-13 DIAGNOSIS — I10 Essential (primary) hypertension: Secondary | ICD-10-CM | POA: Diagnosis not present

## 2014-04-13 DIAGNOSIS — R262 Difficulty in walking, not elsewhere classified: Secondary | ICD-10-CM | POA: Diagnosis not present

## 2014-04-15 DIAGNOSIS — R262 Difficulty in walking, not elsewhere classified: Secondary | ICD-10-CM | POA: Diagnosis not present

## 2014-04-15 DIAGNOSIS — I10 Essential (primary) hypertension: Secondary | ICD-10-CM | POA: Diagnosis not present

## 2014-04-15 DIAGNOSIS — R269 Unspecified abnormalities of gait and mobility: Secondary | ICD-10-CM | POA: Diagnosis not present

## 2014-04-18 ENCOUNTER — Encounter (HOSPITAL_COMMUNITY): Payer: Self-pay

## 2014-04-18 DIAGNOSIS — R262 Difficulty in walking, not elsewhere classified: Secondary | ICD-10-CM | POA: Diagnosis not present

## 2014-04-18 DIAGNOSIS — I10 Essential (primary) hypertension: Secondary | ICD-10-CM | POA: Diagnosis not present

## 2014-04-18 DIAGNOSIS — R269 Unspecified abnormalities of gait and mobility: Secondary | ICD-10-CM | POA: Diagnosis not present

## 2014-04-19 ENCOUNTER — Ambulatory Visit (INDEPENDENT_AMBULATORY_CARE_PROVIDER_SITE_OTHER): Payer: Medicare Other

## 2014-04-19 DIAGNOSIS — J309 Allergic rhinitis, unspecified: Secondary | ICD-10-CM

## 2014-04-21 DIAGNOSIS — R269 Unspecified abnormalities of gait and mobility: Secondary | ICD-10-CM | POA: Diagnosis not present

## 2014-04-21 DIAGNOSIS — I10 Essential (primary) hypertension: Secondary | ICD-10-CM | POA: Diagnosis not present

## 2014-04-21 DIAGNOSIS — R262 Difficulty in walking, not elsewhere classified: Secondary | ICD-10-CM | POA: Diagnosis not present

## 2014-04-26 ENCOUNTER — Ambulatory Visit (HOSPITAL_BASED_OUTPATIENT_CLINIC_OR_DEPARTMENT_OTHER): Payer: Medicare Other | Admitting: Oncology

## 2014-04-26 ENCOUNTER — Telehealth: Payer: Self-pay | Admitting: Oncology

## 2014-04-26 ENCOUNTER — Ambulatory Visit (INDEPENDENT_AMBULATORY_CARE_PROVIDER_SITE_OTHER): Payer: Medicare Other

## 2014-04-26 ENCOUNTER — Other Ambulatory Visit (HOSPITAL_BASED_OUTPATIENT_CLINIC_OR_DEPARTMENT_OTHER): Payer: Medicare Other

## 2014-04-26 ENCOUNTER — Other Ambulatory Visit: Payer: Medicare Other

## 2014-04-26 VITALS — BP 109/64 | HR 67 | Temp 98.1°F | Resp 18 | Ht 62.0 in | Wt 211.7 lb

## 2014-04-26 DIAGNOSIS — C185 Malignant neoplasm of splenic flexure: Secondary | ICD-10-CM

## 2014-04-26 DIAGNOSIS — C184 Malignant neoplasm of transverse colon: Secondary | ICD-10-CM

## 2014-04-26 DIAGNOSIS — D509 Iron deficiency anemia, unspecified: Secondary | ICD-10-CM

## 2014-04-26 DIAGNOSIS — J309 Allergic rhinitis, unspecified: Secondary | ICD-10-CM

## 2014-04-26 LAB — CBC WITH DIFFERENTIAL/PLATELET
BASO%: 1.8 % (ref 0.0–2.0)
BASOS ABS: 0.1 10*3/uL (ref 0.0–0.1)
EOS%: 3.7 % (ref 0.0–7.0)
Eosinophils Absolute: 0.2 10*3/uL (ref 0.0–0.5)
HEMATOCRIT: 30.4 % — AB (ref 34.8–46.6)
HGB: 9.2 g/dL — ABNORMAL LOW (ref 11.6–15.9)
LYMPH%: 29.8 % (ref 14.0–49.7)
MCH: 21 pg — AB (ref 25.1–34.0)
MCHC: 30.3 g/dL — ABNORMAL LOW (ref 31.5–36.0)
MCV: 69.1 fL — AB (ref 79.5–101.0)
MONO#: 0.6 10*3/uL (ref 0.1–0.9)
MONO%: 9.1 % (ref 0.0–14.0)
NEUT#: 3.4 10*3/uL (ref 1.5–6.5)
NEUT%: 55.6 % (ref 38.4–76.8)
Platelets: 304 10*3/uL (ref 145–400)
RBC: 4.4 10*6/uL (ref 3.70–5.45)
RDW: 19.2 % — AB (ref 11.2–14.5)
WBC: 6.2 10*3/uL (ref 3.9–10.3)
lymph#: 1.8 10*3/uL (ref 0.9–3.3)

## 2014-04-26 NOTE — Telephone Encounter (Signed)
Pt confirmed labs/ov per 03/29 POF, gave pt AVS and Calendar.... KJ °

## 2014-04-26 NOTE — [facility] Oncology Navigator Note (Signed)
Met with patient today during her visit with Dr. Sherrill. Provided her with my contact information and role of GI Oncology Navigator. Assisted MD with exam. 

## 2014-04-26 NOTE — Patient Instructions (Signed)
[facility] Discharge Instructions  RECOMMENDATIONS MADE BY THE CONSULTANT AND ANY TEST RESULTS WILL BE SENT TO YOUR REFERRING PHYSICIAN.  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 There is no known benefit to treat with chemotherapy at this time. Will observe for now.  MEDICATIONS PRESCRIBED:   N/A  INSTRUCTIONS GIVEN AND DISCUSSED:  Check CBC on the way out and nurse will call you with results to adjust your iron if necessary  SPECIAL INSTRUCTIONS/FOLLOW-UP:  Return in June to see Ned Card, NP and [name].  Thank you for choosing [facility] to provide your oncology and hematology care.  To afford each patient quality time with our providers, please arrive at least 30 minutes before your scheduled appointment time.  With your help, our goal is to use those 30 minutes to complete the necessary work-up to ensure our physicians have the information they need to help with your evaluation and healthcare recommendations.     ___________________  Should you have questions after your visit to [facility], please contact our office at [phone] between the hours of 8:30 a.m. and 4:30 p.m.  Voicemails left after 4:00 p.m. will not be returned until the following business day.  For prescription refill requests, have your pharmacy contact our office with your prescription refill request. We request 24 hour notice for all refill requests.

## 2014-04-26 NOTE — Progress Notes (Signed)
[facility] OFFICE PROGRESS NOTE   Diagnosis: Colon cancer  INTERVAL HISTORY:   Ms. Commerford was taken the operating room by Dr. Georgette Dover on 04/06/2014. A mass was noted in the small bowel to the left of the midline. Tattoo ink was noted approximately 10 cm proximal to the small bowel mass. 30 cm of small bowel appeared to be involved with tumor. The small bowel was resected. The pathology [id number]) revealed invasive adenocarcinoma measuring 9 cm extending through the small bowel wall and involving an adjacent loop of adherent small bowel. Lymphovascular invasion was identified. Metastatic carcinoma involved 2 of 12 lymph nodes. The tumor appeared to be a recurrence of the previous colon cancer.  She is recovering from surgery. She reports the abdominal pain she had prior to surgery has resolved. She is participating in home physical therapy. She is taking iron.  Objective:  Vital signs in last 24 hours:  Blood pressure 109/64, pulse 67, temperature 98.1 F (36.7 C), temperature source Oral, resp. rate 18, height 5' 2" (1.575 m), weight 211 lb 11.2 oz (96.026 kg), SpO2 99 %.    HEENT: Neck without mass Lymphatics: No cervical, supraclavicular, axillary, or inguinal nodes Resp: Lungs clear bilaterally Cardio: Regular rate and rhythm GI: No hepatomegaly, nontender, no mass, healing midline incision with Steri-Strips in place Vascular: No leg edema   Lab Results:  CBC pending today  Lab Results  Component Value Date   CEA 0.6 02/01/2014    Medications: I have reviewed the patient's current medications.  Assessment/Plan: 1. 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   Presentation to the emergency  room 02/02/2013 with a colonic obstruction secondary to tumor at the splenic flexure.   Baseline CEA on 02/02/2013 less than 0.5.   Initiation of adjuvant Xeloda 04/10/2013.   Cycle 2 adjuvant Xeloda 05/01/2013.   Cycle 3 adjuvant Xeloda 05/22/2013.   Cycle 4 adjuvant Xeloda 06/12/2013.   Cycle 5 adjuvant Xeloda 07/03/2013 (Xeloda dose reduced due to hand foot syndrome).   Cycle 6 adjuvant Xeloda 07/24/2013.   Cycle 7 adjuvant Xeloda 08/14/2013   Cycle 8 adjuvant Xeloda 09/07/2013.  2. History of iron deficiency anemia. Recurrent anemia 02/01/2014 3. Asthma. 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 5. Status post ostomy reversal 11/25/2013. 6. CT 02/01/2014 with no evidence of local tumor recurrence or metastatic disease. New area of masslike thickening and small bowel dilatation at the mid small bowel  Status post deep enteroscopy at [facility] on 03/16/2014, negative.  Status post capsule endoscopy 03/22/2014, confirmed a small bowel tumor  Exploratory laparotomy with resection of a small bowel mass on 04/06/2014 with the pathology confirming invasive adenocarcinoma extending through small bowel wall and involving adjacent loops of adherent small bowel, 2 of 12 lymph nodes positive. History of consistent with recurrent colon cancer. Loss of MLH1 and PMS 2, MSI high as was the January 2015 tumor  Disposition:  Ms. Pizzini is recovering from the small bowel resection. It appears for abdominal pain and recurrent iron deficiency anemia were related to metastatic colon cancer involving the small bowel. A small bowel mass was resected.  We discussed the indication for "adjuvant" chemotherapy. I explained there is no clear role for adjuvant chemotherapy in this setting. I do not recommend chemotherapy in her case. She understands  there is a high chance of developing progressive colon cancer over the next few years.  We will obtain a CBC today to follow-up on the anemia. Ms.  Imbert will return for an office visit in 3 months.  Betsy Coder, MD  04/26/2014  12:00 PM

## 2014-04-27 DIAGNOSIS — I10 Essential (primary) hypertension: Secondary | ICD-10-CM | POA: Diagnosis not present

## 2014-04-27 DIAGNOSIS — R262 Difficulty in walking, not elsewhere classified: Secondary | ICD-10-CM | POA: Diagnosis not present

## 2014-04-27 DIAGNOSIS — R269 Unspecified abnormalities of gait and mobility: Secondary | ICD-10-CM | POA: Diagnosis not present

## 2014-04-29 DIAGNOSIS — R262 Difficulty in walking, not elsewhere classified: Secondary | ICD-10-CM | POA: Diagnosis not present

## 2014-04-29 DIAGNOSIS — I10 Essential (primary) hypertension: Secondary | ICD-10-CM | POA: Diagnosis not present

## 2014-04-29 DIAGNOSIS — R269 Unspecified abnormalities of gait and mobility: Secondary | ICD-10-CM | POA: Diagnosis not present

## 2014-05-02 DIAGNOSIS — R269 Unspecified abnormalities of gait and mobility: Secondary | ICD-10-CM | POA: Diagnosis not present

## 2014-05-02 DIAGNOSIS — R262 Difficulty in walking, not elsewhere classified: Secondary | ICD-10-CM | POA: Diagnosis not present

## 2014-05-02 DIAGNOSIS — I10 Essential (primary) hypertension: Secondary | ICD-10-CM | POA: Diagnosis not present

## 2014-05-04 ENCOUNTER — Encounter: Payer: Self-pay | Admitting: Genetic Counselor

## 2014-05-04 DIAGNOSIS — R269 Unspecified abnormalities of gait and mobility: Secondary | ICD-10-CM | POA: Diagnosis not present

## 2014-05-04 DIAGNOSIS — I10 Essential (primary) hypertension: Secondary | ICD-10-CM | POA: Diagnosis not present

## 2014-05-04 DIAGNOSIS — R262 Difficulty in walking, not elsewhere classified: Secondary | ICD-10-CM | POA: Diagnosis not present

## 2014-05-13 ENCOUNTER — Ambulatory Visit (INDEPENDENT_AMBULATORY_CARE_PROVIDER_SITE_OTHER): Payer: Medicare Other

## 2014-05-13 DIAGNOSIS — J309 Allergic rhinitis, unspecified: Secondary | ICD-10-CM | POA: Diagnosis not present

## 2014-05-25 ENCOUNTER — Ambulatory Visit (INDEPENDENT_AMBULATORY_CARE_PROVIDER_SITE_OTHER): Payer: Medicare Other

## 2014-05-25 DIAGNOSIS — J309 Allergic rhinitis, unspecified: Secondary | ICD-10-CM | POA: Diagnosis not present

## 2014-06-03 ENCOUNTER — Ambulatory Visit (INDEPENDENT_AMBULATORY_CARE_PROVIDER_SITE_OTHER): Payer: Medicare Other

## 2014-06-03 DIAGNOSIS — J309 Allergic rhinitis, unspecified: Secondary | ICD-10-CM | POA: Diagnosis not present

## 2014-06-10 ENCOUNTER — Ambulatory Visit (INDEPENDENT_AMBULATORY_CARE_PROVIDER_SITE_OTHER): Payer: Medicare Other

## 2014-06-10 DIAGNOSIS — J309 Allergic rhinitis, unspecified: Secondary | ICD-10-CM | POA: Diagnosis not present

## 2014-06-15 ENCOUNTER — Ambulatory Visit (INDEPENDENT_AMBULATORY_CARE_PROVIDER_SITE_OTHER): Payer: Medicare Other

## 2014-06-15 DIAGNOSIS — J309 Allergic rhinitis, unspecified: Secondary | ICD-10-CM

## 2014-06-16 ENCOUNTER — Telehealth: Payer: Self-pay | Admitting: Internal Medicine

## 2014-06-16 ENCOUNTER — Ambulatory Visit (INDEPENDENT_AMBULATORY_CARE_PROVIDER_SITE_OTHER): Payer: Medicare Other

## 2014-06-16 DIAGNOSIS — J309 Allergic rhinitis, unspecified: Secondary | ICD-10-CM | POA: Diagnosis not present

## 2014-06-16 NOTE — Telephone Encounter (Signed)
Date Mixed: 06/16/2014  Vial: A Strength: 1:10 Here/Mail/Pick Up: Here Mixed By: Desmond Dike, CMA (AAMA)

## 2014-06-22 ENCOUNTER — Ambulatory Visit (INDEPENDENT_AMBULATORY_CARE_PROVIDER_SITE_OTHER): Payer: Medicare Other

## 2014-06-22 ENCOUNTER — Encounter: Payer: Self-pay | Admitting: Internal Medicine

## 2014-06-22 ENCOUNTER — Ambulatory Visit (INDEPENDENT_AMBULATORY_CARE_PROVIDER_SITE_OTHER): Payer: Medicare Other | Admitting: Internal Medicine

## 2014-06-22 VITALS — BP 140/84 | HR 68 | Wt 223.0 lb

## 2014-06-22 DIAGNOSIS — I10 Essential (primary) hypertension: Secondary | ICD-10-CM | POA: Diagnosis not present

## 2014-06-22 DIAGNOSIS — C184 Malignant neoplasm of transverse colon: Secondary | ICD-10-CM

## 2014-06-22 DIAGNOSIS — J309 Allergic rhinitis, unspecified: Secondary | ICD-10-CM | POA: Diagnosis not present

## 2014-06-22 DIAGNOSIS — K219 Gastro-esophageal reflux disease without esophagitis: Secondary | ICD-10-CM

## 2014-06-22 DIAGNOSIS — J4520 Mild intermittent asthma, uncomplicated: Secondary | ICD-10-CM | POA: Diagnosis not present

## 2014-06-22 NOTE — Patient Instructions (Signed)
Please loose weight - cut back on carbs

## 2014-06-22 NOTE — Assessment & Plan Note (Signed)
Chronic  Diovan HCT, Furosemide

## 2014-06-22 NOTE — Assessment & Plan Note (Signed)
On Omeprazole 

## 2014-06-22 NOTE — Progress Notes (Signed)
Subjective:    F/u feet pain/burning w/chemo - better  F/u colon ca - doing well.   F/u  R knee pain - may need a TKR per Dr Marlou Sa - doing better   HPI   The patient presents for a follow-up of  chronic GERD, asthma controlled with medicines  BP Readings from Last 3 Encounters:  06/22/14 140/84  04/26/14 109/64  04/12/14 120/69   Wt Readings from Last 3 Encounters:  06/22/14 223 lb (101.152 kg)  04/26/14 211 lb 11.2 oz (96.026 kg)  04/06/14 214 lb (97.07 kg)    Review of Systems  Constitutional: Negative.  Negative for fever, chills, diaphoresis, activity change, appetite change and unexpected weight change.  HENT: Negative for congestion, ear pain, facial swelling, hearing loss, mouth sores, nosebleeds, postnasal drip, rhinorrhea, sinus pressure, sneezing, sore throat, tinnitus and trouble swallowing.   Eyes: Negative for pain, discharge, redness, itching and visual disturbance.  Respiratory: Negative for cough, chest tightness, wheezing and stridor.   Cardiovascular: Negative for leg swelling.  Gastrointestinal: Negative for nausea, diarrhea, constipation, blood in stool, abdominal distention, anal bleeding and rectal pain.  Genitourinary: Negative for dysuria, urgency, frequency, hematuria, flank pain, vaginal bleeding, vaginal discharge, difficulty urinating, genital sores and pelvic pain.  Musculoskeletal: Negative for back pain, joint swelling, arthralgias, gait problem and neck stiffness.       L arm in the biceps area as above  Skin: Negative.  Negative for rash.  Neurological: Negative for dizziness, tremors, seizures, syncope, speech difficulty and weakness.  Hematological: Negative for adenopathy. Does not bruise/bleed easily.  Psychiatric/Behavioral: Negative for suicidal ideas, behavioral problems, sleep disturbance, dysphoric mood and decreased concentration. The patient is not nervous/anxious.        Objective:   Physical Exam  Constitutional: She appears  well-developed. No distress.  Obese  HENT:  Head: Normocephalic.  Right Ear: External ear normal.  Left Ear: External ear normal.  Nose: Nose normal.  Mouth/Throat: Oropharynx is clear and moist.  Eyes: Conjunctivae are normal. Pupils are equal, round, and reactive to light. Right eye exhibits no discharge. Left eye exhibits no discharge.  Neck: Normal range of motion. Neck supple. No JVD present. No tracheal deviation present. No thyromegaly present.  Cardiovascular: Normal rate, regular rhythm and normal heart sounds.   Pulmonary/Chest: No stridor. No respiratory distress. She has no wheezes.  Abdominal: Soft. Bowel sounds are normal. She exhibits no distension and no mass. There is no tenderness. There is no rebound and no guarding.  Musculoskeletal: She exhibits no edema or tenderness.  Lymphadenopathy:    She has no cervical adenopathy.  Neurological: She displays normal reflexes. No cranial nerve deficit. She exhibits normal muscle tone. Coordination normal.  Skin: No rash noted. No erythema.  Psychiatric: She has a normal mood and affect. Her behavior is normal. Judgment and thought content normal.   Abd scars healed B feet and ankles deformed, no rash  Lab Results  Component Value Date   WBC 6.2 04/26/2014   HGB 9.2* 04/26/2014   HCT 30.4* 04/26/2014   PLT 304 04/26/2014   GLUCOSE 72 04/10/2014   CHOL 144 07/14/2012   TRIG 59.0 07/14/2012   HDL 76.60 07/14/2012   LDLCALC 56 07/14/2012   ALT 7 03/23/2014   AST 9 03/23/2014   NA 142 04/10/2014   K 3.9 04/10/2014   CL 104 04/10/2014   CREATININE 0.75 04/10/2014   BUN <5* 04/10/2014   CO2 29 04/10/2014   TSH 1.91 01/27/2013  INR 1.07 02/02/2013   HGBA1C 5.5 09/20/2013         Assessment & Plan:

## 2014-06-22 NOTE — Assessment & Plan Note (Signed)
Chronic  Proair prn, Singulair

## 2014-06-22 NOTE — Assessment & Plan Note (Signed)
Dr Georgette Dover Dr Benay Spice Colostomy s/p reversal in 10/15

## 2014-06-22 NOTE — Progress Notes (Signed)
Pre visit review using our clinic review tool, if applicable. No additional management support is needed unless otherwise documented below in the visit note. 

## 2014-07-05 ENCOUNTER — Ambulatory Visit (INDEPENDENT_AMBULATORY_CARE_PROVIDER_SITE_OTHER): Payer: Medicare Other

## 2014-07-05 DIAGNOSIS — J309 Allergic rhinitis, unspecified: Secondary | ICD-10-CM | POA: Diagnosis not present

## 2014-07-06 ENCOUNTER — Encounter: Payer: Self-pay | Admitting: Internal Medicine

## 2014-07-12 ENCOUNTER — Telehealth: Payer: Self-pay | Admitting: Oncology

## 2014-07-12 ENCOUNTER — Other Ambulatory Visit (HOSPITAL_BASED_OUTPATIENT_CLINIC_OR_DEPARTMENT_OTHER): Payer: Medicare Other

## 2014-07-12 ENCOUNTER — Ambulatory Visit (INDEPENDENT_AMBULATORY_CARE_PROVIDER_SITE_OTHER): Payer: Medicare Other

## 2014-07-12 ENCOUNTER — Ambulatory Visit (HOSPITAL_BASED_OUTPATIENT_CLINIC_OR_DEPARTMENT_OTHER): Payer: Medicare Other | Admitting: Nurse Practitioner

## 2014-07-12 VITALS — BP 130/69 | HR 79 | Temp 98.2°F | Resp 18 | Ht 62.0 in | Wt 225.9 lb

## 2014-07-12 DIAGNOSIS — R05 Cough: Secondary | ICD-10-CM | POA: Diagnosis not present

## 2014-07-12 DIAGNOSIS — C185 Malignant neoplasm of splenic flexure: Secondary | ICD-10-CM | POA: Diagnosis not present

## 2014-07-12 DIAGNOSIS — L271 Localized skin eruption due to drugs and medicaments taken internally: Secondary | ICD-10-CM | POA: Diagnosis not present

## 2014-07-12 DIAGNOSIS — C184 Malignant neoplasm of transverse colon: Secondary | ICD-10-CM

## 2014-07-12 DIAGNOSIS — R059 Cough, unspecified: Secondary | ICD-10-CM

## 2014-07-12 DIAGNOSIS — J309 Allergic rhinitis, unspecified: Secondary | ICD-10-CM | POA: Diagnosis not present

## 2014-07-12 LAB — CBC WITH DIFFERENTIAL/PLATELET
BASO%: 0.7 % (ref 0.0–2.0)
BASOS ABS: 0 10*3/uL (ref 0.0–0.1)
EOS ABS: 0.2 10*3/uL (ref 0.0–0.5)
EOS%: 3.6 % (ref 0.0–7.0)
HCT: 35.4 % (ref 34.8–46.6)
HGB: 11.1 g/dL — ABNORMAL LOW (ref 11.6–15.9)
LYMPH%: 31.8 % (ref 14.0–49.7)
MCH: 22.3 pg — ABNORMAL LOW (ref 25.1–34.0)
MCHC: 31.4 g/dL — ABNORMAL LOW (ref 31.5–36.0)
MCV: 71.2 fL — ABNORMAL LOW (ref 79.5–101.0)
MONO#: 0.5 10*3/uL (ref 0.1–0.9)
MONO%: 9.3 % (ref 0.0–14.0)
NEUT#: 3 10*3/uL (ref 1.5–6.5)
NEUT%: 54.6 % (ref 38.4–76.8)
PLATELETS: 259 10*3/uL (ref 145–400)
RBC: 4.97 10*6/uL (ref 3.70–5.45)
RDW: 18.8 % — AB (ref 11.2–14.5)
WBC: 5.5 10*3/uL (ref 3.9–10.3)
lymph#: 1.8 10*3/uL (ref 0.9–3.3)

## 2014-07-12 MED ORDER — HYDROCODONE-HOMATROPINE 5-1.5 MG/5ML PO SYRP: 5.0000 mL | ORAL_SOLUTION | Freq: Four times a day (QID) | ORAL | Status: AC | PRN

## 2014-07-12 NOTE — Progress Notes (Signed)
  [facility] OFFICE PROGRESS NOTE   Diagnosis:  Colon cancer  INTERVAL HISTORY:   [name] returns as scheduled. Aside from current allergy symptoms she feels well. At present she has a cough and watery eyes. No shortness of breath. No fever. She would like some cough medication. She reports taking Hycodan in the past. She reports a good appetite. No abdominal pain. Bowels moving regularly. No nausea or vomiting.  Objective:  Vital signs in last 24 hours:  Blood pressure 130/69, pulse 79, temperature 98.2 F (36.8 C), temperature source Oral, resp. rate 18, height $RemoveBe'5\' 2"'cWadUagAR$  (1.575 m), weight 225 lb 14.4 oz (102.468 kg), SpO2 100 %.    HEENT: No thrush or ulcers. Lymphatics: No palpable cervical, supra clavicular, axillary or inguinal lymph nodes. Resp: Lungs clear bilaterally. Cardio: Regular rate and rhythm. GI: Abdomen soft and nontender. No hepatomegaly. No mass. Vascular: No leg edema.  Lab Results:  Lab Results  Component Value Date   WBC 5.5 07/12/2014   HGB 11.1* 07/12/2014   HCT 35.4 07/12/2014   MCV 71.2* 07/12/2014   PLT 259 07/12/2014   NEUTROABS 3.0 07/12/2014    Imaging:  No results found.  Medications: I have reviewed the patient's current medications.  Assessment/Plan: 1. 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   Presentation to the emergency room 02/02/2013 with a colonic obstruction secondary to tumor at the splenic flexure.   Baseline CEA on 02/02/2013 less than 0.5.   Initiation of adjuvant Xeloda 04/10/2013.   Cycle 2 adjuvant Xeloda 05/01/2013.   Cycle 3 adjuvant Xeloda 05/22/2013.   Cycle 4 adjuvant Xeloda 06/12/2013.   Cycle 5 adjuvant Xeloda  07/03/2013 (Xeloda dose reduced due to hand foot syndrome).   Cycle 6 adjuvant Xeloda 07/24/2013.   Cycle 7 adjuvant Xeloda 08/14/2013   Cycle 8 adjuvant Xeloda 09/07/2013.  2. History of iron deficiency anemia. Recurrent anemia 02/01/2014 3. Asthma. 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 5. Status post ostomy reversal 11/25/2013. 6. CT 02/01/2014 with no evidence of local tumor recurrence or metastatic disease. New area of masslike thickening and small bowel dilatation at the mid small bowel  Status post deep enteroscopy at [facility] on 03/16/2014, negative.  Status post capsule endoscopy 03/22/2014, confirmed a small bowel tumor  Exploratory laparotomy with resection of a small bowel mass on 04/06/2014 with the pathology confirming invasive adenocarcinoma extending through small bowel wall and involving adjacent loops of adherent small bowel, 2 of 12 lymph nodes positive. History of consistent with recurrent colon cancer. Loss of MLH1 and PMS 2, MSI high as was the January 2015 tumor   Disposition: [name] appears stable. There is no clinical evidence of disease progression. We will follow-up on the CEA from today. The plan is to continue to follow on an observation approach.  For the cough she was given a prescription for Hycodan cough syrup 5 mL every 6 hours as needed. She will follow-up with her PCP if the cough does not improve.  She will return for a follow-up visit and CEA in 3 months. She will contact the office in the interim with any problems.  Plan reviewed with [name].  Ned Card ANP/GNP-BC   07/12/2014  10:59 AM

## 2014-07-12 NOTE — Telephone Encounter (Signed)
Gave adn printed appt sched and avs for pt for SEpt  °

## 2014-07-13 LAB — CEA: CEA: 0.7 ng/mL (ref 0.0–5.0)

## 2014-07-14 ENCOUNTER — Telehealth: Payer: Self-pay | Admitting: *Deleted

## 2014-07-14 NOTE — Telephone Encounter (Signed)
Left message with normal lab results.

## 2014-07-14 NOTE — Telephone Encounter (Signed)
-----   Message from Ladell Pier, MD sent at 07/13/2014  8:39 PM EDT ----- Please call patient, cea is normal

## 2014-07-21 ENCOUNTER — Ambulatory Visit (INDEPENDENT_AMBULATORY_CARE_PROVIDER_SITE_OTHER): Payer: Medicare Other

## 2014-07-21 DIAGNOSIS — J309 Allergic rhinitis, unspecified: Secondary | ICD-10-CM

## 2014-07-25 ENCOUNTER — Encounter: Payer: Self-pay | Admitting: *Deleted

## 2014-07-25 ENCOUNTER — Ambulatory Visit: Payer: Medicare Other

## 2014-07-28 ENCOUNTER — Ambulatory Visit (INDEPENDENT_AMBULATORY_CARE_PROVIDER_SITE_OTHER): Payer: Medicare Other

## 2014-07-28 DIAGNOSIS — J309 Allergic rhinitis, unspecified: Secondary | ICD-10-CM

## 2014-08-04 ENCOUNTER — Ambulatory Visit (INDEPENDENT_AMBULATORY_CARE_PROVIDER_SITE_OTHER): Payer: Medicare Other | Admitting: Internal Medicine

## 2014-08-04 ENCOUNTER — Encounter: Payer: Self-pay | Admitting: Internal Medicine

## 2014-08-04 ENCOUNTER — Ambulatory Visit (INDEPENDENT_AMBULATORY_CARE_PROVIDER_SITE_OTHER): Payer: Medicare Other

## 2014-08-04 VITALS — BP 128/78 | HR 48 | Ht 62.0 in | Wt 228.4 lb

## 2014-08-04 DIAGNOSIS — J301 Allergic rhinitis due to pollen: Secondary | ICD-10-CM

## 2014-08-04 DIAGNOSIS — J309 Allergic rhinitis, unspecified: Secondary | ICD-10-CM

## 2014-08-04 DIAGNOSIS — G4733 Obstructive sleep apnea (adult) (pediatric): Secondary | ICD-10-CM | POA: Diagnosis not present

## 2014-08-04 MED ORDER — MONTELUKAST SODIUM 10 MG PO TABS: 10.0000 mg | ORAL_TABLET | Freq: Every day | ORAL | Status: DC

## 2014-08-04 MED ORDER — ALBUTEROL SULFATE HFA 108 (90 BASE) MCG/ACT IN AERS: INHALATION_SPRAY | RESPIRATORY_TRACT | Status: DC

## 2014-08-04 MED ORDER — MOMETASONE FUROATE 50 MCG/ACT NA SUSP: 2.0000 | Freq: Every day | NASAL | Status: DC | PRN

## 2014-08-04 MED ORDER — FLUTICASONE-SALMETEROL 250-50 MCG/DOSE IN AEPB: INHALATION_SPRAY | RESPIRATORY_TRACT | Status: DC

## 2014-08-04 MED ORDER — ALBUTEROL SULFATE (2.5 MG/3ML) 0.083% IN NEBU: 2.5000 mg | INHALATION_SOLUTION | Freq: Four times a day (QID) | RESPIRATORY_TRACT | Status: DC | PRN

## 2014-08-04 NOTE — Patient Instructions (Signed)
Medication refill scripts printed  Order- D[facility]- replacement for old, worn out CPAP machine, 9 cwp, mask of choice, humidifier, supplies   AirView if available  Dx OSA  Please call if we can help

## 2014-08-04 NOTE — Progress Notes (Signed)
Subjective:    Patient ID: [name], female    DOB: 1950/12/01, 64 y.o.   MRN: [id number]  HPI 08/07/10- 60 yoF never smoker followed for asthma, allergic rhinitis,, OSA, obesity/hypoventilation, complicated by GERD, arthritis, HBP Last here January 31, 2010 CPAP still used every night, works well at [employer]. Allergy vaccine at 1;10, still seems to have helped.  Recent hot weather has been hard on her- coughing more, wheezing more, interfering with sleep. Sputum trace yellow. Had temp 101, no sore throat. Some nausea and vomiting  No chest pain.    08/23/11-  61 yoF never smoker followed for asthma, allergic rhinitis,, OSA, obesity/hypoventilation, complicated by GERD, arthritis, HBP Patient states a little better since last visit. c/o dry cough x 1 week.  Denies sob, wheezing, chest pain, and chest tightness.  She likes humid weather that we have had this year better than very dry weather. She reports doing well with her allergy vaccine at 1:10 GH. Using rescue inhaler or nebulizer about once daily. She depends on her nebulizer/ albuterol neb solution especially if she's a little tighter. We discussed appropriate use. Walking regularly 2-3 times per week. She reports good compliance and control with her CPAP 9/ [facility].  08/24/12- 61 yoF never smoker followed for asthma, allergic rhinitis,, OSA, obesity/hypoventilation, complicated by GERD, arthritis, HBP FOLLOWS FOR: pt reports breathing about the same since last visit-- having a little dry ocugh d/t hot weather-- denies any other concerns at this time  Allergy vaccine 1:10 GH OSA-CPAP 9/ [facility]. She says she uses it all night every night. Minor dry cough. She is aware of mild reflux but no major events.  08/24/13- 63 yoF never smoker followed for asthma, allergic rhinitis,, OSA, obesity/hypoventilation, complicated by GERD, arthritis, HBP, Colon Ca/ L hemicolectomy FOLLOWS FOR: c/o mouth dryness in mornings after using  CPAP 9/ [facility], nonprod cough after being in the heat.  Pt wears cpap 6-8 hours nightly, no problems with mask/supplies.  Tolerating allergy injections well.  Allergy vaccine 1:10 GH Had colon cancer surgery in January and pending reversal of colostomy in September with no respiratory problems so far. CXR 02/09/13 IMPRESSION:  1. Enteric tube tip and side port projects below the left  hemidiaphragm. Otherwise, stable position of remaining support  apparatus.  2. Improved aeration of the lungs with persistent perihilar and  medial basilar opacities, likely atelectasis.  3. Pulmonary venous congestion without frank evidence of edema.  Electronically Signed  By: Sandi Mariscal M.D.  On: 02/09/2013 07:46  08/04/14-64 yoF never smoker followed for asthma, allergic rhinitis,, OSA, obesity/hypoventilation, complicated by GERD, arthritis, HBP, Colon Ca/ L hemicolectomy FOLLOWS FOR:No DL at this time-will need to place order for one-DME is [facility]. CPAP 9/ [facility] for 6-7 hours each night. Feeling rested. Leak in CPAP hose. Continues allergy vaccine 1:10 GH-doing well.   ROS-see HPI Constitutional:   No-   weight loss, night sweats, fevers, chills, fatigue, lassitude. HEENT:   No-  headaches, difficulty swallowing, tooth/dental problems, sore throat,       No-  sneezing, itching, ear ache, nasal congestion, post nasal drip,  CV:  No-   chest pain, orthopnea, PND, swelling in lower extremities, anasarca, dizziness, palpitations Resp: No- acute  shortness of breath with exertion or at rest.              No-   productive cough,  +non-productive cough,  No- coughing up of blood.  No-   change in color of mucus.  No-sustained wheezing.   Skin: No-   rash or lesions. GI:  + heartburn, indigestion, no-abdominal pain, nausea, vomiting,  GU: No  complaint MS:  No-   joint pain or swelling.   Neuro-     nothing unusual Psych:  No- change in mood or affect. No depression or anxiety.  No memory  loss.  OBJ- Physical Exam General- Alert, Oriented, Affect-appropriate, Distress- none acute, +obese Skin- rash-none, lesions- none, excoriation- none Lymphadenopathy- none Head- atraumatic            Eyes- Gross vision intact, PERRLA, conjunctivae and secretions clear            Ears- Hearing, canals-normal            Nose- Clear, no-Septal dev, mucus, polyps, erosion, perforation             Throat- Mallampati II , mucosa clear,not dry , drainage- none, tonsils- atrophic, + hoarse Neck- flexible , trachea midline, no stridor , thyroid nl, carotid no bruit Chest - symmetrical excursion , unlabored           Heart/CV- RRR , no murmur , no gallop  , no rub, nl s1 s2                           - JVD- none , edema- none, stasis changes- none, varices- none           Lung- clear to P&A, wheeze- none, cough-none , dullness-none, rub- none           Chest wall-  Abd-  Br/ Gen/ Rectal- Not done, not indicated Extrem- cyanosis- none, clubbing, none, atrophy- none, strength- nl Neuro- grossly intact to observation

## 2014-08-07 NOTE — Assessment & Plan Note (Signed)
She is satisfied that allergy vaccine continues to be helpful. Discussed goals, risks and ability to stop at any time.

## 2014-08-07 NOTE — Assessment & Plan Note (Signed)
We think she is doing very well with CPAP 9/[facility] but we need download for confirmation as discussed with her. Weight loss is again encouraged

## 2014-08-11 ENCOUNTER — Ambulatory Visit (INDEPENDENT_AMBULATORY_CARE_PROVIDER_SITE_OTHER): Payer: Medicare Other

## 2014-08-11 DIAGNOSIS — J309 Allergic rhinitis, unspecified: Secondary | ICD-10-CM | POA: Diagnosis not present

## 2014-08-18 ENCOUNTER — Ambulatory Visit (INDEPENDENT_AMBULATORY_CARE_PROVIDER_SITE_OTHER): Payer: Medicare Other

## 2014-08-18 DIAGNOSIS — J309 Allergic rhinitis, unspecified: Secondary | ICD-10-CM | POA: Diagnosis not present

## 2014-08-25 ENCOUNTER — Ambulatory Visit (INDEPENDENT_AMBULATORY_CARE_PROVIDER_SITE_OTHER): Payer: Medicare Other

## 2014-08-25 ENCOUNTER — Ambulatory Visit: Payer: Medicare Other | Admitting: Internal Medicine

## 2014-08-25 DIAGNOSIS — J309 Allergic rhinitis, unspecified: Secondary | ICD-10-CM | POA: Diagnosis not present

## 2014-09-01 ENCOUNTER — Ambulatory Visit (INDEPENDENT_AMBULATORY_CARE_PROVIDER_SITE_OTHER): Payer: Medicare Other

## 2014-09-01 DIAGNOSIS — J309 Allergic rhinitis, unspecified: Secondary | ICD-10-CM | POA: Diagnosis not present

## 2014-09-06 ENCOUNTER — Telehealth: Payer: Self-pay

## 2014-09-06 NOTE — Telephone Encounter (Signed)
Spoke to pt and pt stated that imaging was done at [facility]. No entry for such.

## 2014-09-08 ENCOUNTER — Ambulatory Visit: Payer: Medicare Other

## 2014-09-15 ENCOUNTER — Ambulatory Visit (INDEPENDENT_AMBULATORY_CARE_PROVIDER_SITE_OTHER): Payer: Medicare Other

## 2014-09-15 DIAGNOSIS — J309 Allergic rhinitis, unspecified: Secondary | ICD-10-CM | POA: Diagnosis not present

## 2014-09-20 ENCOUNTER — Encounter: Payer: Self-pay | Admitting: Internal Medicine

## 2014-09-22 ENCOUNTER — Ambulatory Visit (INDEPENDENT_AMBULATORY_CARE_PROVIDER_SITE_OTHER): Payer: Medicare Other

## 2014-09-22 DIAGNOSIS — J309 Allergic rhinitis, unspecified: Secondary | ICD-10-CM

## 2014-09-29 ENCOUNTER — Ambulatory Visit (INDEPENDENT_AMBULATORY_CARE_PROVIDER_SITE_OTHER): Payer: Medicare Other

## 2014-09-29 DIAGNOSIS — J309 Allergic rhinitis, unspecified: Secondary | ICD-10-CM | POA: Diagnosis not present

## 2014-10-06 ENCOUNTER — Telehealth: Payer: Self-pay | Admitting: Internal Medicine

## 2014-10-06 ENCOUNTER — Ambulatory Visit (INDEPENDENT_AMBULATORY_CARE_PROVIDER_SITE_OTHER): Payer: Medicare Other

## 2014-10-06 DIAGNOSIS — J309 Allergic rhinitis, unspecified: Secondary | ICD-10-CM | POA: Diagnosis not present

## 2014-10-06 MED ORDER — PROMETHAZINE-CODEINE 6.25-10 MG/5ML PO SYRP: 5.0000 mL | ORAL_SOLUTION | Freq: Four times a day (QID) | ORAL | Status: DC | PRN

## 2014-10-06 NOTE — Telephone Encounter (Signed)
Ok to refill this time 200 ml

## 2014-10-06 NOTE — Telephone Encounter (Signed)
Called spoke with pt. She is asking for promethazine w/ codeine to be called in. Has a dry cough, PND, watery eyes. Please advise Dr. Annamaria Boots thanks  No Known Allergies   Current Outpatient Prescriptions on File Prior to Visit  Medication Sig Dispense Refill  . albuterol (PROAIR HFA) 108 (90 BASE) MCG/ACT inhaler INHALE 2 PUFFS INTO THE LUNGS BY MOUTH EVERY 6 HOURS AS NEEDED FOR WHEEZING/SHORTNESS OF BREATH 25.5 each 11  . albuterol (PROVENTIL) (2.5 MG/3ML) 0.083% nebulizer solution Take 3 mLs (2.5 mg total) by nebulization every 6 (six) hours as needed for wheezing or shortness of breath. 75 mL 12  . aspirin 81 MG tablet Take 81 mg by mouth daily.     . cetirizine (ZYRTEC) 10 MG tablet Take 1 tablet (10 mg total) by mouth daily. 100 tablet 3  . Cholecalciferol (VITAMIN D) 1000 UNITS capsule Take 1,000 Units by mouth every morning.     . Cyanocobalamin (VITAMIN B-12) 1000 MCG SUBL Place 1 tablet (1,000 mcg total) under the tongue daily. 100 tablet 3  . ferrous sulfate 325 (65 FE) MG tablet Take 325 mg by mouth 3 (three) times daily with meals.     . Fluticasone-Salmeterol (ADVAIR) 250-50 MCG/DOSE AEPB 1 puff then rinse mouth, twice daily maintenance 60 each 11  . furosemide (LASIX) 20 MG tablet Take 20-40 mg by mouth daily as needed for fluid.    . mometasone (NASONEX) 50 MCG/ACT nasal spray Place 2 sprays into the nose daily as needed (allergies). 17 g 11  . montelukast (SINGULAIR) 10 MG tablet Take 1 tablet (10 mg total) by mouth at bedtime. 90 tablet 3  . NONFORMULARY OR COMPOUNDED ITEM Allergy Vaccine 1:10 Given at [facility]    . omeprazole (PRILOSEC) 20 MG capsule Take 20 mg by mouth daily.    Marland Kitchen oxyCODONE-acetaminophen (PERCOCET/ROXICET) 5-325 MG per tablet Take 1 tablet by mouth every 4 (four) hours as needed for moderate pain. 40 tablet 0  . potassium chloride (K-DUR) 10 MEQ tablet Take 1 tablet (10 mEq total) by mouth daily. 90 tablet 3  . PRESCRIPTION MEDICATION once a week.  Allergy shots    . valsartan-hydrochlorothiazide (DIOVAN-HCT) 160-25 MG per tablet Take 1 tablet by mouth daily. 90 tablet 3   No current facility-administered medications on file prior to visit.

## 2014-10-06 NOTE — Telephone Encounter (Signed)
Rx called in. Patient notified. Nothing further needed.

## 2014-10-10 ENCOUNTER — Ambulatory Visit (INDEPENDENT_AMBULATORY_CARE_PROVIDER_SITE_OTHER): Payer: Medicare Other

## 2014-10-10 DIAGNOSIS — J309 Allergic rhinitis, unspecified: Secondary | ICD-10-CM | POA: Diagnosis not present

## 2014-10-11 ENCOUNTER — Telehealth: Payer: Self-pay | Admitting: Internal Medicine

## 2014-10-11 NOTE — Telephone Encounter (Signed)
Date Mixed: 10/10/2014 Vial: A Strength: 1:10 Here/Mail/Pick Up: Here Mixed By: Desmond Dike, CMA

## 2014-10-13 ENCOUNTER — Ambulatory Visit: Payer: Medicare Other

## 2014-10-18 ENCOUNTER — Ambulatory Visit (INDEPENDENT_AMBULATORY_CARE_PROVIDER_SITE_OTHER): Payer: Medicare Other

## 2014-10-18 ENCOUNTER — Ambulatory Visit (HOSPITAL_BASED_OUTPATIENT_CLINIC_OR_DEPARTMENT_OTHER): Payer: Medicare Other | Admitting: Oncology

## 2014-10-18 ENCOUNTER — Other Ambulatory Visit (HOSPITAL_BASED_OUTPATIENT_CLINIC_OR_DEPARTMENT_OTHER): Payer: Medicare Other

## 2014-10-18 ENCOUNTER — Telehealth: Payer: Self-pay | Admitting: Oncology

## 2014-10-18 VITALS — BP 142/67 | HR 58 | Temp 98.4°F | Resp 18 | Ht 62.0 in | Wt 225.3 lb

## 2014-10-18 DIAGNOSIS — C184 Malignant neoplasm of transverse colon: Secondary | ICD-10-CM

## 2014-10-18 DIAGNOSIS — J309 Allergic rhinitis, unspecified: Secondary | ICD-10-CM

## 2014-10-18 LAB — CBC WITH DIFFERENTIAL/PLATELET
BASO%: 1.6 % (ref 0.0–2.0)
Basophils Absolute: 0.1 10*3/uL (ref 0.0–0.1)
EOS%: 3.5 % (ref 0.0–7.0)
Eosinophils Absolute: 0.2 10*3/uL (ref 0.0–0.5)
HEMATOCRIT: 37.8 % (ref 34.8–46.6)
HGB: 12.2 g/dL (ref 11.6–15.9)
LYMPH%: 28.4 % (ref 14.0–49.7)
MCH: 22.8 pg — ABNORMAL LOW (ref 25.1–34.0)
MCHC: 32.2 g/dL (ref 31.5–36.0)
MCV: 70.9 fL — ABNORMAL LOW (ref 79.5–101.0)
MONO#: 0.6 10*3/uL (ref 0.1–0.9)
MONO%: 10.2 % (ref 0.0–14.0)
NEUT#: 3.3 10*3/uL (ref 1.5–6.5)
NEUT%: 56.3 % (ref 38.4–76.8)
Platelets: 246 10*3/uL (ref 145–400)
RBC: 5.33 10*6/uL (ref 3.70–5.45)
RDW: 18.4 % — ABNORMAL HIGH (ref 11.2–14.5)
WBC: 5.8 10*3/uL (ref 3.9–10.3)
lymph#: 1.7 10*3/uL (ref 0.9–3.3)

## 2014-10-18 LAB — CEA: CEA: 0.6 ng/mL (ref 0.0–5.0)

## 2014-10-18 MED ORDER — OXYCODONE-ACETAMINOPHEN 5-325 MG PO TABS: 1.0000 | ORAL_TABLET | ORAL | Status: DC | PRN

## 2014-10-18 NOTE — Telephone Encounter (Signed)
Gave adn printed appt sched and avs for pt for Sept adn DEC

## 2014-10-18 NOTE — Progress Notes (Signed)
  [place] [facility] OFFICE PROGRESS NOTE   Diagnosis: Colon cancer  INTERVAL HISTORY:   Ms. Maiden returns as scheduled. She feels well. No abdominal pain. Good appetite. She has chronic intermittent low back pain. She requests a refill on Percocet. She takes this infrequently.  Objective:  Vital signs in last 24 hours:  Blood pressure 142/67, pulse 58, temperature 98.4 F (36.9 C), temperature source Oral, resp. rate 18, height $RemoveBe'5\' 2"'bdkTzvnin$  (1.575 m), weight 225 lb 4.8 oz (102.195 kg), SpO2 99 %.    HEENT: Neck without mass Lymphatics: No cervical, supra-clavicular, axillary, or inguinal nodes Resp: Lungs clear bilaterally Cardio: Regular rate and rhythm GI: No hepatosplenomegaly, nontender, no mass Vascular: No leg edema  Musculoskeletal: No tenderness at the low lumbar spine/sacrum   Lab Results:  Lab Results  Component Value Date   WBC 5.8 10/18/2014   HGB 12.2 10/18/2014   HCT 37.8 10/18/2014   MCV 70.9* 10/18/2014   PLT 246 10/18/2014   NEUTROABS 3.3 10/18/2014      Lab Results  Component Value Date   CEA 0.7 07/12/2014    Imaging:  No results found.  Medications: I have reviewed the patient's current medications.  Assessment/Plan: 1. 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   Presentation to the emergency room 02/02/2013 with a colonic obstruction secondary to tumor at the splenic flexure.   Baseline CEA on 02/02/2013 less than 0.5.   Initiation of adjuvant Xeloda 04/10/2013.   Cycle 2 adjuvant Xeloda 05/01/2013.   Cycle 3 adjuvant Xeloda 05/22/2013.   Cycle 4 adjuvant Xeloda 06/12/2013.   Cycle 5 adjuvant Xeloda 07/03/2013 (Xeloda dose reduced due to hand foot  syndrome).   Cycle 6 adjuvant Xeloda 07/24/2013.   Cycle 7 adjuvant Xeloda 08/14/2013   Cycle 8 adjuvant Xeloda 09/07/2013.  2. History of iron deficiency anemia. Recurrent anemia 02/01/2014, improved 3. Asthma. 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 5. Status post ostomy reversal 11/25/2013. 6. CT 02/01/2014 with no evidence of local tumor recurrence or metastatic disease. New area of masslike thickening and small bowel dilatation at the mid small bowel  Status post deep enteroscopy at [facility] on 03/16/2014, negative.  Status post capsule endoscopy 03/22/2014, confirmed a small bowel tumor  Exploratory laparotomy with resection of a small bowel mass on 04/06/2014 with the pathology confirming invasive adenocarcinoma extending through small bowel wall and involving adjacent loops of adherent small bowel, 2 of 12 lymph nodes positive. History of consistent with recurrent colon cancer. Loss of MLH1 and PMS 2, MSI high as was the January 2015 tumor   Disposition:  Ms. Mirabella remains in clinical remission from colon cancer. We will follow-up on the CEA from today. She will return for an office visit and CEA in 3 months. We refilled her prescription for Percocet today.  Betsy Coder, MD  10/18/2014  10:57 AM

## 2014-10-18 NOTE — Addendum Note (Signed)
Addended by: Adalberto Cole on: 10/18/2014 04:23 PM   Modules accepted: Medications

## 2014-10-21 ENCOUNTER — Telehealth: Payer: Self-pay

## 2014-10-21 NOTE — Telephone Encounter (Signed)
-----   Message from Domenic Schwab, RN sent at 10/21/2014  2:37 PM EDT -----   ----- Message -----    From: Ladell Pier, MD    Sent: 10/19/2014   7:36 PM      To: Adalberto Cole, RN, Brien Few, RN, #  Please call patient, cea is normal

## 2014-10-21 NOTE — Telephone Encounter (Signed)
Called and informed pt of normal cea results. Pt verbalized understanding and denies any questions or concerns at this time. 

## 2014-10-26 ENCOUNTER — Encounter: Payer: Self-pay | Admitting: Internal Medicine

## 2014-10-26 ENCOUNTER — Ambulatory Visit (INDEPENDENT_AMBULATORY_CARE_PROVIDER_SITE_OTHER): Payer: Medicare Other | Admitting: Internal Medicine

## 2014-10-26 ENCOUNTER — Ambulatory Visit (INDEPENDENT_AMBULATORY_CARE_PROVIDER_SITE_OTHER): Payer: Medicare Other

## 2014-10-26 VITALS — BP 128/82 | HR 65 | Wt 226.0 lb

## 2014-10-26 DIAGNOSIS — Z23 Encounter for immunization: Secondary | ICD-10-CM | POA: Diagnosis not present

## 2014-10-26 DIAGNOSIS — J309 Allergic rhinitis, unspecified: Secondary | ICD-10-CM

## 2014-10-26 DIAGNOSIS — M79605 Pain in left leg: Secondary | ICD-10-CM | POA: Diagnosis not present

## 2014-10-26 DIAGNOSIS — E669 Obesity, unspecified: Secondary | ICD-10-CM

## 2014-10-26 DIAGNOSIS — J4520 Mild intermittent asthma, uncomplicated: Secondary | ICD-10-CM

## 2014-10-26 DIAGNOSIS — I10 Essential (primary) hypertension: Secondary | ICD-10-CM

## 2014-10-26 MED ORDER — NAPROXEN 500 MG PO TABS: 500.0000 mg | ORAL_TABLET | Freq: Two times a day (BID) | ORAL | Status: DC

## 2014-10-26 NOTE — Progress Notes (Signed)
Subjective:  Patient ID: [name], female    DOB: 01-Sep-1950  Age: 64 y.o. MRN: [id number]  CC: No chief complaint on file.   HPI [name] presents for asthma, B12 def, Vit D def, colon ca f/up. C/o LLE pain x 4 days  Outpatient Prescriptions Prior to Visit  Medication Sig Dispense Refill  . albuterol (PROAIR HFA) 108 (90 BASE) MCG/ACT inhaler INHALE 2 PUFFS INTO THE LUNGS BY MOUTH EVERY 6 HOURS AS NEEDED FOR WHEEZING/SHORTNESS OF BREATH 25.5 each 11  . albuterol (PROVENTIL) (2.5 MG/3ML) 0.083% nebulizer solution Take 3 mLs (2.5 mg total) by nebulization every 6 (six) hours as needed for wheezing or shortness of breath. 75 mL 12  . aspirin 81 MG tablet Take 81 mg by mouth daily.     . cetirizine (ZYRTEC) 10 MG tablet Take 1 tablet (10 mg total) by mouth daily. 100 tablet 3  . Cholecalciferol (VITAMIN D) 1000 UNITS capsule Take 1,000 Units by mouth every morning.     . Cyanocobalamin (VITAMIN B-12) 1000 MCG SUBL Place 1 tablet (1,000 mcg total) under the tongue daily. 100 tablet 3  . ferrous sulfate 325 (65 FE) MG tablet Take 325 mg by mouth 3 (three) times daily with meals.     . Fluticasone-Salmeterol (ADVAIR) 250-50 MCG/DOSE AEPB 1 puff then rinse mouth, twice daily maintenance 60 each 11  . furosemide (LASIX) 20 MG tablet Take 20-40 mg by mouth daily as needed for fluid.    . mometasone (NASONEX) 50 MCG/ACT nasal spray Place 2 sprays into the nose daily as needed (allergies). 17 g 11  . montelukast (SINGULAIR) 10 MG tablet Take 1 tablet (10 mg total) by mouth at bedtime. 90 tablet 3  . NONFORMULARY OR COMPOUNDED ITEM Allergy Vaccine 1:10 Given at [facility]    . omeprazole (PRILOSEC) 20 MG capsule Take 20 mg by mouth daily.    Marland Kitchen oxyCODONE-acetaminophen (PERCOCET/ROXICET) 5-325 MG per tablet Take 1 tablet by mouth every 4 (four) hours as needed for moderate pain. 40 tablet 0  . potassium chloride (K-DUR) 10 MEQ tablet Take 1 tablet (10 mEq total) by mouth  daily. 90 tablet 3  . PRESCRIPTION MEDICATION once a week. Allergy shots    . valsartan-hydrochlorothiazide (DIOVAN-HCT) 160-25 MG per tablet Take 1 tablet by mouth daily. 90 tablet 3  . promethazine-codeine (PHENERGAN WITH CODEINE) 6.25-10 MG/5ML syrup Take 5 mLs by mouth every 6 (six) hours as needed for cough. 200 mL 0   No facility-administered medications prior to visit.    ROS Review of Systems  Constitutional: Negative for chills, activity change, appetite change, fatigue and unexpected weight change.  HENT: Negative for congestion, mouth sores and sinus pressure.   Eyes: Negative for visual disturbance.  Respiratory: Negative for cough and chest tightness.   Gastrointestinal: Negative for nausea and abdominal pain.  Genitourinary: Negative for frequency, difficulty urinating and vaginal pain.  Musculoskeletal: Positive for arthralgias. Negative for back pain and gait problem.  Skin: Negative for pallor and rash.  Neurological: Negative for dizziness, tremors, weakness, numbness and headaches.  Psychiatric/Behavioral: Negative for confusion and sleep disturbance. The patient is not nervous/anxious.     Objective:  BP 128/82 mmHg  Pulse 65  Wt 226 lb (102.513 kg)  SpO2 95%  BP Readings from Last 3 Encounters:  10/26/14 128/82  10/18/14 142/67  08/04/14 128/78    Wt Readings from Last 3 Encounters:  10/26/14 226 lb (102.513 kg)  10/18/14 225 lb 4.8 oz (102.195  kg)  08/04/14 228 lb 6.4 oz (103.602 kg)    Physical Exam  Constitutional: She appears well-developed. No distress.  HENT:  Head: Normocephalic.  Right Ear: External ear normal.  Left Ear: External ear normal.  Nose: Nose normal.  Mouth/Throat: Oropharynx is clear and moist.  Eyes: Conjunctivae are normal. Pupils are equal, round, and reactive to light. Right eye exhibits no discharge. Left eye exhibits no discharge.  Neck: Normal range of motion. Neck supple. No JVD present. No tracheal deviation present.  No thyromegaly present.  Cardiovascular: Normal rate, regular rhythm and normal heart sounds.   Pulmonary/Chest: No stridor. No respiratory distress. She has no wheezes.  Abdominal: Soft. Bowel sounds are normal. She exhibits no distension and no mass. There is no tenderness. There is no rebound and no guarding.  Musculoskeletal: She exhibits edema and tenderness.  Lymphadenopathy:    She has no cervical adenopathy.  Neurological: She displays normal reflexes. No cranial nerve deficit. She exhibits normal muscle tone. Coordination normal.  Skin: No rash noted. No erythema.  Psychiatric: She has a normal mood and affect. Her behavior is normal. Judgment and thought content normal.  L inner dist thigh is tender to palp B trace shin edema Calves NT  Lab Results  Component Value Date   WBC 5.8 10/18/2014   HGB 12.2 10/18/2014   HCT 37.8 10/18/2014   PLT 246 10/18/2014   GLUCOSE 72 04/10/2014   CHOL 144 07/14/2012   TRIG 59.0 07/14/2012   HDL 76.60 07/14/2012   LDLCALC 56 07/14/2012   ALT 7 03/23/2014   AST 9 03/23/2014   NA 142 04/10/2014   K 3.9 04/10/2014   CL 104 04/10/2014   CREATININE 0.75 04/10/2014   BUN <5* 04/10/2014   CO2 29 04/10/2014   TSH 1.91 01/27/2013   INR 1.07 02/02/2013   HGBA1C 5.5 09/20/2013    No results found.  Assessment & Plan:   Diagnoses and all orders for this visit:  Essential hypertension  Asthma, mild intermittent, uncomplicated  Obesity  Leg pain, left  Need for influenza vaccination -     Flu Vaccine QUAD 36+ mos IM  Other orders -     naproxen (NAPROSYN) 500 MG tablet; Take 1 tablet (500 mg total) by mouth 2 (two) times daily with a meal.  I have discontinued [name] promethazine-codeine. I am also having her start on naproxen. Additionally, I am having her maintain her Vitamin D, aspirin, ferrous sulfate, Vitamin B-12, potassium chloride, cetirizine, furosemide, omeprazole, valsartan-hydrochlorothiazide, PRESCRIPTION  MEDICATION, Fluticasone-Salmeterol, albuterol, montelukast, mometasone, albuterol, NONFORMULARY OR COMPOUNDED ITEM, and oxyCODONE-acetaminophen.  Meds ordered this encounter  Medications  . naproxen (NAPROSYN) 500 MG tablet    Sig: Take 1 tablet (500 mg total) by mouth 2 (two) times daily with a meal.    Dispense:  30 tablet    Refill:  1     Follow-up: Return in about 4 months (around 02/25/2015) for a follow-up visit.  Walker Kehr, MD

## 2014-10-26 NOTE — Assessment & Plan Note (Signed)
9/16 prox leg LLE ?MSK. No signs of DVT Naproxen, Percocet prn RTC if not better

## 2014-10-26 NOTE — Assessment & Plan Note (Signed)
Wt Readings from Last 3 Encounters:  10/26/14 226 lb (102.513 kg)  10/18/14 225 lb 4.8 oz (102.195 kg)  08/04/14 228 lb 6.4 oz (103.602 kg)

## 2014-10-26 NOTE — Progress Notes (Signed)
Pre visit review using our clinic review tool, if applicable. No additional management support is needed unless otherwise documented below in the visit note. 

## 2014-10-26 NOTE — Assessment & Plan Note (Signed)
Proair prn, Singulair

## 2014-10-26 NOTE — Assessment & Plan Note (Signed)
Diovan HCT, Furosemide

## 2014-11-03 ENCOUNTER — Ambulatory Visit: Payer: Medicare Other

## 2014-11-09 ENCOUNTER — Encounter: Payer: Self-pay | Admitting: Pulmonary Disease

## 2014-11-09 ENCOUNTER — Other Ambulatory Visit (INDEPENDENT_AMBULATORY_CARE_PROVIDER_SITE_OTHER): Payer: Medicare Other

## 2014-11-09 ENCOUNTER — Ambulatory Visit (INDEPENDENT_AMBULATORY_CARE_PROVIDER_SITE_OTHER): Payer: Medicare Other

## 2014-11-09 ENCOUNTER — Ambulatory Visit (INDEPENDENT_AMBULATORY_CARE_PROVIDER_SITE_OTHER): Payer: Medicare Other | Admitting: Pulmonary Disease

## 2014-11-09 ENCOUNTER — Ambulatory Visit (INDEPENDENT_AMBULATORY_CARE_PROVIDER_SITE_OTHER)
Admission: RE | Admit: 2014-11-09 | Discharge: 2014-11-09 | Disposition: A | Discharge status: Home / Self Care | Payer: Medicare Other | Source: Ambulatory Visit | Attending: Pulmonary Disease | Admitting: Pulmonary Disease

## 2014-11-09 ENCOUNTER — Telehealth: Payer: Self-pay | Admitting: Internal Medicine

## 2014-11-09 VITALS — BP 118/74 | HR 58 | Ht 62.0 in | Wt 229.8 lb

## 2014-11-09 DIAGNOSIS — J309 Allergic rhinitis, unspecified: Secondary | ICD-10-CM

## 2014-11-09 DIAGNOSIS — J45909 Unspecified asthma, uncomplicated: Secondary | ICD-10-CM

## 2014-11-09 DIAGNOSIS — R059 Cough, unspecified: Secondary | ICD-10-CM

## 2014-11-09 DIAGNOSIS — G4733 Obstructive sleep apnea (adult) (pediatric): Secondary | ICD-10-CM

## 2014-11-09 DIAGNOSIS — R05 Cough: Secondary | ICD-10-CM

## 2014-11-09 DIAGNOSIS — K219 Gastro-esophageal reflux disease without esophagitis: Secondary | ICD-10-CM | POA: Diagnosis not present

## 2014-11-09 DIAGNOSIS — Z9989 Dependence on other enabling machines and devices: Secondary | ICD-10-CM

## 2014-11-09 LAB — CBC WITH DIFFERENTIAL/PLATELET
Basophils Absolute: 0.1 10*3/uL (ref 0.0–0.1)
Basophils Relative: 1.1 % (ref 0.0–3.0)
EOS PCT: 2.9 % (ref 0.0–5.0)
Eosinophils Absolute: 0.2 10*3/uL (ref 0.0–0.7)
HEMATOCRIT: 39.3 % (ref 36.0–46.0)
Hemoglobin: 12.6 g/dL (ref 12.0–15.0)
LYMPHS PCT: 26.3 % (ref 12.0–46.0)
Lymphs Abs: 1.6 10*3/uL (ref 0.7–4.0)
MCHC: 32 g/dL (ref 30.0–36.0)
MCV: 72.5 fl — AB (ref 78.0–100.0)
MONOS PCT: 9.3 % (ref 3.0–12.0)
Monocytes Absolute: 0.6 10*3/uL (ref 0.1–1.0)
NEUTROS ABS: 3.6 10*3/uL (ref 1.4–7.7)
Neutrophils Relative %: 60.4 % (ref 43.0–77.0)
Platelets: 253 10*3/uL (ref 150.0–400.0)
RBC: 5.42 Mil/uL — AB (ref 3.87–5.11)
RDW: 18.8 % — ABNORMAL HIGH (ref 11.5–15.5)
WBC: 5.9 10*3/uL (ref 4.0–10.5)

## 2014-11-09 MED ORDER — BENZONATATE 100 MG PO CAPS: 100.0000 mg | ORAL_CAPSULE | Freq: Three times a day (TID) | ORAL | Status: DC | PRN

## 2014-11-09 MED ORDER — RANITIDINE HCL 150 MG PO TABS
150.0000 mg | ORAL_TABLET | Freq: Every day | ORAL | Status: DC
Start: 2014-11-09 — End: 2015-05-22

## 2014-11-09 NOTE — Telephone Encounter (Signed)
Patient in lobby: Patient requesting cough syrup.  Patient says that she has dry cough, chest tightness, no fever, no sob.  Just needs help with cough. Patient came in for injection today and was advised to wait in lobby..  MR - please advise.  CY not in office.

## 2014-11-09 NOTE — Patient Instructions (Signed)
1. You can use the Tessalon Perles (benzonatate) to help with your cough as needed. 2. Continue taking her medicines as prescribed. 3. I'm starting you on Zantac (ranitidine) to take at night before bedtime in case silent reflux may be contributing to your cough. Continue taking your Prilosec as prescribed. 4. I'm ordering a swallow evaluation to see if there may be some obstruction in your esophagus or severe reflux contributing to your cough. 5. Continue using your Nasonex as prescribed but try using a nasal saline rinse (such as NeilMed) to help with your sinus congestion. You can purchase this over-the-counter. 6. We will see you back in 3-4 weeks but please call if you have any new symptoms or get worse.

## 2014-11-09 NOTE — Telephone Encounter (Signed)
Per MR, patient needs to be seen or she can go to ER, UC or pick up cough syrup OTC.  He recommends that she be seen. Patient was scheduled to see Dr. Ashok Cordia at 2:30pm.  Nothing further needed.
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
## 2014-11-14 ENCOUNTER — Other Ambulatory Visit: Payer: Self-pay | Admitting: Internal Medicine

## 2014-11-14 ENCOUNTER — Ambulatory Visit (HOSPITAL_COMMUNITY)
Admission: RE | Admit: 2014-11-14 | Discharge: 2014-11-14 | Disposition: A | Discharge status: Home / Self Care | Payer: Medicare Other | Source: Ambulatory Visit | Attending: Pulmonary Disease | Admitting: Pulmonary Disease

## 2014-11-14 DIAGNOSIS — R05 Cough: Secondary | ICD-10-CM | POA: Diagnosis not present

## 2014-11-14 DIAGNOSIS — K219 Gastro-esophageal reflux disease without esophagitis: Secondary | ICD-10-CM | POA: Diagnosis not present

## 2014-11-14 DIAGNOSIS — R059 Cough, unspecified: Secondary | ICD-10-CM

## 2014-11-16 ENCOUNTER — Ambulatory Visit (INDEPENDENT_AMBULATORY_CARE_PROVIDER_SITE_OTHER): Payer: Medicare Other

## 2014-11-16 DIAGNOSIS — J309 Allergic rhinitis, unspecified: Secondary | ICD-10-CM

## 2014-11-23 ENCOUNTER — Ambulatory Visit (INDEPENDENT_AMBULATORY_CARE_PROVIDER_SITE_OTHER): Payer: Medicare Other

## 2014-11-23 DIAGNOSIS — J309 Allergic rhinitis, unspecified: Secondary | ICD-10-CM | POA: Diagnosis not present

## 2014-11-28 ENCOUNTER — Other Ambulatory Visit: Payer: Self-pay | Admitting: Internal Medicine

## 2014-11-30 ENCOUNTER — Ambulatory Visit (INDEPENDENT_AMBULATORY_CARE_PROVIDER_SITE_OTHER): Payer: Medicare Other

## 2014-11-30 ENCOUNTER — Ambulatory Visit (INDEPENDENT_AMBULATORY_CARE_PROVIDER_SITE_OTHER): Payer: Medicare Other | Admitting: Adult Health

## 2014-11-30 ENCOUNTER — Encounter: Payer: Self-pay | Admitting: Adult Health

## 2014-11-30 VITALS — BP 130/88 | HR 54 | Temp 98.4°F | Ht 62.0 in | Wt 231.0 lb

## 2014-11-30 DIAGNOSIS — J309 Allergic rhinitis, unspecified: Secondary | ICD-10-CM

## 2014-11-30 DIAGNOSIS — R059 Cough, unspecified: Secondary | ICD-10-CM

## 2014-11-30 DIAGNOSIS — R05 Cough: Secondary | ICD-10-CM

## 2014-11-30 NOTE — Patient Instructions (Signed)
Continue on current regimen  follow up Dr. Annamaria Boots  In 3 months and As needed

## 2014-11-30 NOTE — Progress Notes (Signed)
   Subjective:    Patient ID: [name], female    DOB: 1951/01/23, 64 y.o.   MRN: [id number]  HPI 64 year old female never smoker followed for asthma, allergic rhinitis and obstructive sleep apnea  11/30/2014 Follow up : Asthma /AR  Patient returns for a two-week follow-up. She was seen 3 weeks ago for a flare of her cough. She was seen by Dr. Ashok Cordia. Chest x-ray showed no sign of acute process. She was sent for a barium swallow that showed no sign of obstruction or reflux. She was given Ladona Ridgel and started on Zantac. CBC with differential showed normal eosinophils and a normal white count. She was recommended to continue on Nasonex and Singulair along with adding in saline nasal rinses. Patient returns today refilling improved with decreased cough. He says it is not totally resolved but is much improved. She denies any fever, chest pain, orthopnea, PND .    Review of Systems Constitutional:   No  weight loss, night sweats,  Fevers, chills, fatigue, or  lassitude.  HEENT:   No headaches,  Difficulty swallowing,  Tooth/dental problems, or  Sore throat,                No sneezing, itching, ear ache,  +nasal congestion, post nasal drip,   CV:  No chest pain,  Orthopnea, PND, swelling in lower extremities, anasarca, dizziness, palpitations, syncope.   GI  No heartburn, indigestion, abdominal pain, nausea, vomiting, diarrhea, change in bowel habits, loss of appetite, bloody stools.   Resp:    No chest wall deformity  Skin: no rash or lesions.  GU: no dysuria, change in color of urine, no urgency or frequency.  No flank pain, no hematuria   MS:  No joint pain or swelling.  No decreased range of motion.  No back pain.  Psych:  No change in mood or affect. No depression or anxiety.  No memory loss.         Objective:   Physical Exam GEN: A/Ox3; pleasant , NAD, elderly   HEENT:  [place]/AT,  EACs-clear, TMs-wnl, NOSE-clear, THROAT-clear, no lesions, no postnasal drip  or exudate noted.   NECK:  Supple w/ fair ROM; no JVD; normal carotid impulses w/o bruits; no thyromegaly or nodules palpated; no lymphadenopathy.  RESP  Clear  P & A; w/o, wheezes/ rales/ or rhonchi.no accessory muscle use, no dullness to percussion  CARD:  RRR, no m/r/g  , tr  peripheral edema, pulses intact, no cyanosis or clubbing.  GI:   Soft & nt; nml bowel sounds; no organomegaly or masses detected.  Musco: Warm bil, no deformities or joint swelling noted.   Neuro: alert, no focal deficits noted.    Skin: Warm, no lesions or rashes         Assessment & Plan:

## 2014-11-30 NOTE — Assessment & Plan Note (Signed)
Recent flare resolved with GERD tx and sinus control rx  cxr and esophagram were ok  Plan  Cont on current regimen

## 2014-12-07 ENCOUNTER — Ambulatory Visit (INDEPENDENT_AMBULATORY_CARE_PROVIDER_SITE_OTHER): Payer: Medicare Other

## 2014-12-07 DIAGNOSIS — J309 Allergic rhinitis, unspecified: Secondary | ICD-10-CM | POA: Diagnosis not present

## 2014-12-14 ENCOUNTER — Ambulatory Visit (INDEPENDENT_AMBULATORY_CARE_PROVIDER_SITE_OTHER): Payer: Medicare Other

## 2014-12-14 DIAGNOSIS — J309 Allergic rhinitis, unspecified: Secondary | ICD-10-CM

## 2014-12-16 ENCOUNTER — Telehealth: Payer: Self-pay | Admitting: Internal Medicine

## 2014-12-16 NOTE — Telephone Encounter (Signed)
Suggest otc Mucinex-DM to help cough and sinuses Extra fluids Saline nasal spray

## 2014-12-16 NOTE — Telephone Encounter (Signed)
Patient notified of Dr. Young's recommendations. Nothing further needed. Closing encounter  

## 2014-12-16 NOTE — Telephone Encounter (Signed)
Patient says she has a dry, non-productive cough; PND; pain behind left eye  Her chest does not feel tight.  No SOB.    No Known Allergies  Current Outpatient Prescriptions on File Prior to Visit  Medication Sig Dispense Refill  . albuterol (PROAIR HFA) 108 (90 BASE) MCG/ACT inhaler INHALE 2 PUFFS INTO THE LUNGS BY MOUTH EVERY 6 HOURS AS NEEDED FOR WHEEZING/SHORTNESS OF BREATH 25.5 each 11  . albuterol (PROVENTIL) (2.5 MG/3ML) 0.083% nebulizer solution Take 3 mLs (2.5 mg total) by nebulization every 6 (six) hours as needed for wheezing or shortness of breath. 75 mL 12  . aspirin 81 MG tablet Take 81 mg by mouth daily.     . benzonatate (TESSALON) 100 MG capsule Take 1 capsule (100 mg total) by mouth 3 (three) times daily as needed for cough. 60 capsule 0  . cetirizine (ZYRTEC) 10 MG tablet Take 1 tablet (10 mg total) by mouth daily. 100 tablet 3  . Cholecalciferol (VITAMIN D) 1000 UNITS capsule Take 1,000 Units by mouth every morning.     . Cyanocobalamin (VITAMIN B-12) 1000 MCG SUBL Place 1 tablet (1,000 mcg total) under the tongue daily. 100 tablet 3  . ferrous sulfate 325 (65 FE) MG tablet Take 325 mg by mouth 3 (three) times daily with meals.     . Fluticasone-Salmeterol (ADVAIR) 250-50 MCG/DOSE AEPB 1 puff then rinse mouth, twice daily maintenance 60 each 11  . furosemide (LASIX) 20 MG tablet Take 20-40 mg by mouth daily as needed for fluid.    . mometasone (NASONEX) 50 MCG/ACT nasal spray Place 2 sprays into the nose daily as needed (allergies). 17 g 11  . montelukast (SINGULAIR) 10 MG tablet Take 1 tablet (10 mg total) by mouth at bedtime. 90 tablet 3  . naproxen (NAPROSYN) 500 MG tablet TAKE 1 TABLET BY MOUTH TWICE A DAY WITH MEALS 30 tablet 1  . NONFORMULARY OR COMPOUNDED ITEM Allergy Vaccine 1:10 Given at [facility]    . omeprazole (PRILOSEC) 20 MG capsule Take 20 mg by mouth daily.    Marland Kitchen oxyCODONE-acetaminophen (PERCOCET/ROXICET) 5-325 MG per tablet Take 1 tablet by mouth  every 4 (four) hours as needed for moderate pain. 40 tablet 0  . potassium chloride (K-DUR) 10 MEQ tablet Take 1 tablet (10 mEq total) by mouth daily. 90 tablet 3  . ranitidine (ZANTAC) 150 MG tablet Take 1 tablet (150 mg total) by mouth at bedtime. 30 tablet 3  . valsartan-hydrochlorothiazide (DIOVAN-HCT) 160-25 MG per tablet Take 1 tablet by mouth daily. 90 tablet 3   No current facility-administered medications on file prior to visit.

## 2014-12-19 ENCOUNTER — Telehealth: Payer: Self-pay | Admitting: Internal Medicine

## 2014-12-19 MED ORDER — PREDNISONE 10 MG PO TABS: ORAL_TABLET | ORAL | Status: DC

## 2014-12-19 NOTE — Telephone Encounter (Signed)
Patient calling back, she called on Friday and was told to use Mucinex and nasal spray, she says that the mucinex is not working.  She is coughing all night long, dry cough, not getting anything out.  No fever.  Chest is not tight, but sore from coughing so hard.  No Known Allergies  Current Outpatient Prescriptions on File Prior to Visit  Medication Sig Dispense Refill  . albuterol (PROAIR HFA) 108 (90 BASE) MCG/ACT inhaler INHALE 2 PUFFS INTO THE LUNGS BY MOUTH EVERY 6 HOURS AS NEEDED FOR WHEEZING/SHORTNESS OF BREATH 25.5 each 11  . albuterol (PROVENTIL) (2.5 MG/3ML) 0.083% nebulizer solution Take 3 mLs (2.5 mg total) by nebulization every 6 (six) hours as needed for wheezing or shortness of breath. 75 mL 12  . aspirin 81 MG tablet Take 81 mg by mouth daily.     . benzonatate (TESSALON) 100 MG capsule Take 1 capsule (100 mg total) by mouth 3 (three) times daily as needed for cough. 60 capsule 0  . cetirizine (ZYRTEC) 10 MG tablet Take 1 tablet (10 mg total) by mouth daily. 100 tablet 3  . Cholecalciferol (VITAMIN D) 1000 UNITS capsule Take 1,000 Units by mouth every morning.     . Cyanocobalamin (VITAMIN B-12) 1000 MCG SUBL Place 1 tablet (1,000 mcg total) under the tongue daily. 100 tablet 3  . ferrous sulfate 325 (65 FE) MG tablet Take 325 mg by mouth 3 (three) times daily with meals.     . Fluticasone-Salmeterol (ADVAIR) 250-50 MCG/DOSE AEPB 1 puff then rinse mouth, twice daily maintenance 60 each 11  . furosemide (LASIX) 20 MG tablet Take 20-40 mg by mouth daily as needed for fluid.    . mometasone (NASONEX) 50 MCG/ACT nasal spray Place 2 sprays into the nose daily as needed (allergies). 17 g 11  . montelukast (SINGULAIR) 10 MG tablet Take 1 tablet (10 mg total) by mouth at bedtime. 90 tablet 3  . naproxen (NAPROSYN) 500 MG tablet TAKE 1 TABLET BY MOUTH TWICE A DAY WITH MEALS 30 tablet 1  . NONFORMULARY OR COMPOUNDED ITEM Allergy Vaccine 1:10 Given at [facility]    . omeprazole  (PRILOSEC) 20 MG capsule Take 20 mg by mouth daily.    Marland Kitchen oxyCODONE-acetaminophen (PERCOCET/ROXICET) 5-325 MG per tablet Take 1 tablet by mouth every 4 (four) hours as needed for moderate pain. 40 tablet 0  . potassium chloride (K-DUR) 10 MEQ tablet Take 1 tablet (10 mEq total) by mouth daily. 90 tablet 3  . ranitidine (ZANTAC) 150 MG tablet Take 1 tablet (150 mg total) by mouth at bedtime. 30 tablet 3  . valsartan-hydrochlorothiazide (DIOVAN-HCT) 160-25 MG per tablet Take 1 tablet by mouth daily. 90 tablet 3   No current facility-administered medications on file prior to visit.

## 2014-12-19 NOTE — Telephone Encounter (Signed)
Offer prednisone 10 mg, # 20, 4 X 2 DAYS, 3 X 2 DAYS, 2 X 2 DAYS, 1 X 2 DAYS  

## 2014-12-19 NOTE — Telephone Encounter (Signed)
Called spoke with pt. Aware of recs. RX called in.

## 2014-12-20 ENCOUNTER — Ambulatory Visit (INDEPENDENT_AMBULATORY_CARE_PROVIDER_SITE_OTHER): Payer: Medicare Other

## 2014-12-20 DIAGNOSIS — J309 Allergic rhinitis, unspecified: Secondary | ICD-10-CM | POA: Diagnosis not present

## 2014-12-28 ENCOUNTER — Ambulatory Visit (INDEPENDENT_AMBULATORY_CARE_PROVIDER_SITE_OTHER): Payer: Medicare Other

## 2014-12-28 DIAGNOSIS — J309 Allergic rhinitis, unspecified: Secondary | ICD-10-CM | POA: Diagnosis not present

## 2015-01-04 ENCOUNTER — Ambulatory Visit (INDEPENDENT_AMBULATORY_CARE_PROVIDER_SITE_OTHER): Payer: Medicare Other

## 2015-01-04 DIAGNOSIS — J309 Allergic rhinitis, unspecified: Secondary | ICD-10-CM

## 2015-01-10 ENCOUNTER — Telehealth: Payer: Self-pay | Admitting: Oncology

## 2015-01-10 ENCOUNTER — Other Ambulatory Visit (HOSPITAL_BASED_OUTPATIENT_CLINIC_OR_DEPARTMENT_OTHER): Payer: Medicare Other

## 2015-01-10 ENCOUNTER — Ambulatory Visit (INDEPENDENT_AMBULATORY_CARE_PROVIDER_SITE_OTHER): Payer: Medicare Other

## 2015-01-10 ENCOUNTER — Ambulatory Visit (HOSPITAL_BASED_OUTPATIENT_CLINIC_OR_DEPARTMENT_OTHER): Payer: Medicare Other | Admitting: Oncology

## 2015-01-10 VITALS — BP 135/63 | HR 54 | Temp 97.9°F | Resp 18 | Ht 62.0 in | Wt 231.9 lb

## 2015-01-10 DIAGNOSIS — Z85038 Personal history of other malignant neoplasm of large intestine: Secondary | ICD-10-CM | POA: Diagnosis not present

## 2015-01-10 DIAGNOSIS — J309 Allergic rhinitis, unspecified: Secondary | ICD-10-CM

## 2015-01-10 DIAGNOSIS — R718 Other abnormality of red blood cells: Secondary | ICD-10-CM

## 2015-01-10 DIAGNOSIS — L271 Localized skin eruption due to drugs and medicaments taken internally: Secondary | ICD-10-CM

## 2015-01-10 DIAGNOSIS — C184 Malignant neoplasm of transverse colon: Secondary | ICD-10-CM

## 2015-01-10 DIAGNOSIS — D649 Anemia, unspecified: Secondary | ICD-10-CM

## 2015-01-10 LAB — CBC WITH DIFFERENTIAL/PLATELET
BASO%: 1 % (ref 0.0–2.0)
BASOS ABS: 0.1 10*3/uL (ref 0.0–0.1)
EOS%: 3.4 % (ref 0.0–7.0)
Eosinophils Absolute: 0.2 10*3/uL (ref 0.0–0.5)
HEMATOCRIT: 39 % (ref 34.8–46.6)
HEMOGLOBIN: 12.2 g/dL (ref 11.6–15.9)
LYMPH#: 1.3 10*3/uL (ref 0.9–3.3)
LYMPH%: 23.5 % (ref 14.0–49.7)
MCH: 23.2 pg — ABNORMAL LOW (ref 25.1–34.0)
MCHC: 31.3 g/dL — AB (ref 31.5–36.0)
MCV: 73.9 fL — ABNORMAL LOW (ref 79.5–101.0)
MONO#: 0.6 10*3/uL (ref 0.1–0.9)
MONO%: 10.3 % (ref 0.0–14.0)
NEUT#: 3.4 10*3/uL (ref 1.5–6.5)
NEUT%: 61.8 % (ref 38.4–76.8)
PLATELETS: 221 10*3/uL (ref 145–400)
RBC: 5.28 10*6/uL (ref 3.70–5.45)
RDW: 17.7 % — AB (ref 11.2–14.5)
WBC: 5.6 10*3/uL (ref 3.9–10.3)

## 2015-01-10 NOTE — Telephone Encounter (Signed)
per pof to sch pt appt-gave pt copy of avs °

## 2015-01-10 NOTE — Progress Notes (Signed)
  [place] OFFICE PROGRESS NOTE   Diagnosis: Colon cancer  INTERVAL HISTORY:   [name] returns as scheduled. She feels well. She has discomfort at the medial aspect of the left knee and proximal lower leg for the past few months. Naproxen helped. No abdominal pain.  Objective:  Vital signs in last 24 hours:  Blood pressure 135/63, pulse 54, temperature 97.9 F (36.6 C), temperature source Oral, resp. rate 18, height 5' 2" (1.575 m), weight 231 lb 14.4 oz (105.189 kg), SpO2 98 %.    HEENT: Neck without mass Lymphatics: No cervical, supraclavicular, axillary, or inguinal nodes. Possible 1 cm soft cystic lesion in the mid right axilla. Resp: Lungs clear bilaterally Cardio: Regular rate and rhythm GI: No hepatosplenomegaly, no mass, nontender Vascular: No leg edema Musculoskeletal: Examination of the left calf is unremarkable     Portacath/PICC-without erythema  Lab Results:  Lab Results  Component Value Date   WBC 5.6 01/10/2015   HGB 12.2 01/10/2015   HCT 39.0 01/10/2015   MCV 73.9* 01/10/2015   PLT 221 01/10/2015   NEUTROABS 3.4 01/10/2015     Lab Results  Component Value Date   CEA 0.6 10/18/2014    Medications: I have reviewed the patient's current medications.  Assessment/Plan: 1. 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   Presentation to the emergency room 02/02/2013 with a colonic obstruction secondary to tumor at the splenic flexure.   Baseline CEA on 02/02/2013 less than 0.5.   Initiation of adjuvant Xeloda 04/10/2013.   Cycle 2 adjuvant Xeloda 05/01/2013.   Cycle 3 adjuvant Xeloda 05/22/2013.   Cycle 4 adjuvant Xeloda 06/12/2013.   Cycle 5 adjuvant  Xeloda 07/03/2013 (Xeloda dose reduced due to hand foot syndrome).   Cycle 6 adjuvant Xeloda 07/24/2013.   Cycle 7 adjuvant Xeloda 08/14/2013   Cycle 8 adjuvant Xeloda 09/07/2013.  2. History of iron deficiency anemia. Recurrent anemia 02/01/2014, improved 3. Asthma. 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 5. Status post ostomy reversal 11/25/2013. 6. CT 02/01/2014 with no evidence of local tumor recurrence or metastatic disease. New area of masslike thickening and small bowel dilatation at the mid small bowel  Status post deep enteroscopy at [facility] on 03/16/2014, negative.  Status post capsule endoscopy 03/22/2014, confirmed a small bowel tumor  Exploratory laparotomy with resection of a small bowel mass on 04/06/2014 with the pathology confirming invasive adenocarcinoma extending through small bowel wall and involving adjacent loops of adherent small bowel, 2 of 12 lymph nodes positive. History consistent with recurrent colon cancer. Loss of MLH1 and PMS 2, MSI high as was the January 2015 tumor    Disposition:  She remains in clinical remission from colon cancer. The hemoglobin is stable, but there is persistent red cell microcytosis. She will continue iron. We will check a ferritin level when she returns in 3 months. We will consider obtaining a restaging CT evaluation after the 3 month visit.  [name] will seek medical attention if the left leg discomfort progresses. She will return for an office and lab visit in 3 months.    Betsy Coder, MD  01/10/2015  11:14 AM

## 2015-01-10 NOTE — Telephone Encounter (Signed)
per pof to sch pt appt-gavee pt copy of avs

## 2015-01-11 ENCOUNTER — Telehealth: Payer: Self-pay | Admitting: *Deleted

## 2015-01-11 LAB — CEA: CEA: <0.5 ng/mL (ref 0.0–5.0)

## 2015-01-11 NOTE — Telephone Encounter (Signed)
Informed pt of normal CEA. She voiced understanding.

## 2015-01-11 NOTE — Telephone Encounter (Signed)
-----   Message from Ladell Pier, MD sent at 01/11/2015  6:44 AM EST ----- Please call patient, cea is normal

## 2015-01-17 ENCOUNTER — Telehealth: Payer: Self-pay | Admitting: Internal Medicine

## 2015-01-17 NOTE — Telephone Encounter (Signed)
Katie advised me to schedule patient for Friday, 2/3 @ 11:15am Patient says that she cannot do Fridays.  She said her son works during weekends and he is her ride. She said that the only days she can come is on Monday through Wednesday.  Not Thursdays or Fridays. And she prefers Morning only.  Not afternoon appointments.  Katie - please advise.

## 2015-01-17 NOTE — Telephone Encounter (Signed)
Called to reschedule appointment for patient, she does not want afternoon appointment, requesting morning only.   Advised her that we only have afternoon appointments available on 2/8.   She asked that we reschedule her for another day that has an earlier appointment.  03/07/15 appointment has been cancelled. Patient aware that appointment has been cancelled.  Katie - please advise.

## 2015-01-17 NOTE — Telephone Encounter (Signed)
Please see if patient can come in on Monday 03-12-14 at 10:45am slot that is held. Thanks.

## 2015-01-17 NOTE — Telephone Encounter (Signed)
Called patient and she said that 03-13-15 would not work for her. She wants to know if she can come on 02/27/15.  She has an appointment with Plotnikov at 10:15 and wanted to know if she could see Dr. Annamaria Boots after her appointment with Plotnikov.   Katie - please advise.

## 2015-01-18 ENCOUNTER — Ambulatory Visit (INDEPENDENT_AMBULATORY_CARE_PROVIDER_SITE_OTHER): Payer: Medicare Other

## 2015-01-18 DIAGNOSIS — J309 Allergic rhinitis, unspecified: Secondary | ICD-10-CM

## 2015-01-19 NOTE — Telephone Encounter (Signed)
After reviewing patient's chart-she has appt with Plotnikov 02-28-15 at 10:15am; we can work her in on CY's schedule on 02-28-15 at 11:15am.    LMTCB-when patient returns call schedulers please inform patient and make OV. Thanks.

## 2015-01-19 NOTE — Telephone Encounter (Signed)
Pt returned call

## 2015-01-20 NOTE — Telephone Encounter (Signed)
Called and left message for patient to call back to schedule appointment Patient can be added to CY's schedule on 02/28/15 at 11:15am. Awaiting call back from patient.

## 2015-01-20 NOTE — Telephone Encounter (Signed)
Pt aware, nothing futther needed

## 2015-01-25 ENCOUNTER — Ambulatory Visit (INDEPENDENT_AMBULATORY_CARE_PROVIDER_SITE_OTHER): Payer: Medicare Other

## 2015-01-25 ENCOUNTER — Ambulatory Visit: Payer: Medicare Other

## 2015-01-25 DIAGNOSIS — J309 Allergic rhinitis, unspecified: Secondary | ICD-10-CM | POA: Diagnosis not present

## 2015-02-01 ENCOUNTER — Ambulatory Visit (INDEPENDENT_AMBULATORY_CARE_PROVIDER_SITE_OTHER): Payer: Medicare Other

## 2015-02-01 DIAGNOSIS — J309 Allergic rhinitis, unspecified: Secondary | ICD-10-CM | POA: Diagnosis not present

## 2015-02-03 ENCOUNTER — Encounter (HOSPITAL_COMMUNITY): Payer: Self-pay | Admitting: Family Medicine

## 2015-02-03 ENCOUNTER — Emergency Department (HOSPITAL_COMMUNITY): Payer: Medicare Other

## 2015-02-03 ENCOUNTER — Emergency Department (HOSPITAL_COMMUNITY)
Admission: EM | Admit: 2015-02-03 | Discharge: 2015-02-04 | Disposition: A | Discharge status: Home / Self Care | Payer: Medicare Other | Attending: Emergency Medicine | Admitting: Emergency Medicine

## 2015-02-03 DIAGNOSIS — Z8701 Personal history of pneumonia (recurrent): Secondary | ICD-10-CM | POA: Insufficient documentation

## 2015-02-03 DIAGNOSIS — Z85038 Personal history of other malignant neoplasm of large intestine: Secondary | ICD-10-CM | POA: Diagnosis not present

## 2015-02-03 DIAGNOSIS — Z87828 Personal history of other (healed) physical injury and trauma: Secondary | ICD-10-CM | POA: Insufficient documentation

## 2015-02-03 DIAGNOSIS — R011 Cardiac murmur, unspecified: Secondary | ICD-10-CM | POA: Insufficient documentation

## 2015-02-03 DIAGNOSIS — K219 Gastro-esophageal reflux disease without esophagitis: Secondary | ICD-10-CM | POA: Diagnosis not present

## 2015-02-03 DIAGNOSIS — Z7982 Long term (current) use of aspirin: Secondary | ICD-10-CM | POA: Insufficient documentation

## 2015-02-03 DIAGNOSIS — K529 Noninfective gastroenteritis and colitis, unspecified: Secondary | ICD-10-CM | POA: Diagnosis not present

## 2015-02-03 DIAGNOSIS — R109 Unspecified abdominal pain: Secondary | ICD-10-CM | POA: Diagnosis not present

## 2015-02-03 DIAGNOSIS — Z8781 Personal history of (healed) traumatic fracture: Secondary | ICD-10-CM | POA: Diagnosis not present

## 2015-02-03 DIAGNOSIS — R059 Cough, unspecified: Secondary | ICD-10-CM

## 2015-02-03 DIAGNOSIS — D649 Anemia, unspecified: Secondary | ICD-10-CM | POA: Insufficient documentation

## 2015-02-03 DIAGNOSIS — R05 Cough: Secondary | ICD-10-CM | POA: Insufficient documentation

## 2015-02-03 DIAGNOSIS — Z86010 Personal history of colonic polyps: Secondary | ICD-10-CM | POA: Insufficient documentation

## 2015-02-03 DIAGNOSIS — Z8659 Personal history of other mental and behavioral disorders: Secondary | ICD-10-CM | POA: Diagnosis not present

## 2015-02-03 DIAGNOSIS — Z8669 Personal history of other diseases of the nervous system and sense organs: Secondary | ICD-10-CM | POA: Insufficient documentation

## 2015-02-03 DIAGNOSIS — R1033 Periumbilical pain: Secondary | ICD-10-CM | POA: Diagnosis present

## 2015-02-03 DIAGNOSIS — M19011 Primary osteoarthritis, right shoulder: Secondary | ICD-10-CM | POA: Insufficient documentation

## 2015-02-03 DIAGNOSIS — J449 Chronic obstructive pulmonary disease, unspecified: Secondary | ICD-10-CM | POA: Insufficient documentation

## 2015-02-03 DIAGNOSIS — I10 Essential (primary) hypertension: Secondary | ICD-10-CM | POA: Insufficient documentation

## 2015-02-03 DIAGNOSIS — E669 Obesity, unspecified: Secondary | ICD-10-CM | POA: Diagnosis not present

## 2015-02-03 DIAGNOSIS — Z79899 Other long term (current) drug therapy: Secondary | ICD-10-CM | POA: Diagnosis not present

## 2015-02-03 DIAGNOSIS — K469 Unspecified abdominal hernia without obstruction or gangrene: Secondary | ICD-10-CM

## 2015-02-03 LAB — URINALYSIS, ROUTINE W REFLEX MICROSCOPIC
BILIRUBIN URINE: NEGATIVE
Glucose, UA: NEGATIVE mg/dL
Hgb urine dipstick: NEGATIVE
KETONES UR: NEGATIVE mg/dL
NITRITE: NEGATIVE
PROTEIN: NEGATIVE mg/dL
Specific Gravity, Urine: 1.013 (ref 1.005–1.030)
pH: 5.5 (ref 5.0–8.0)

## 2015-02-03 LAB — CBC
HCT: 40 % (ref 36.0–46.0)
HEMOGLOBIN: 12.9 g/dL (ref 12.0–15.0)
MCH: 24.5 pg — ABNORMAL LOW (ref 26.0–34.0)
MCHC: 32.3 g/dL (ref 30.0–36.0)
MCV: 75.9 fL — ABNORMAL LOW (ref 78.0–100.0)
Platelets: 259 10*3/uL (ref 150–400)
RBC: 5.27 MIL/uL — ABNORMAL HIGH (ref 3.87–5.11)
RDW: 16.4 % — AB (ref 11.5–15.5)
WBC: 6.8 10*3/uL (ref 4.0–10.5)

## 2015-02-03 LAB — URINE MICROSCOPIC-ADD ON
RBC / HPF: NONE SEEN RBC/hpf (ref 0–5)
WBC UA: NONE SEEN WBC/hpf (ref 0–5)

## 2015-02-03 LAB — COMPREHENSIVE METABOLIC PANEL
ALK PHOS: 139 U/L — AB (ref 38–126)
ALT: 18 U/L (ref 14–54)
ANION GAP: 11 (ref 5–15)
AST: 20 U/L (ref 15–41)
Albumin: 3.6 g/dL (ref 3.5–5.0)
BILIRUBIN TOTAL: 0.3 mg/dL (ref 0.3–1.2)
BUN: 8 mg/dL (ref 6–20)
CALCIUM: 9.9 mg/dL (ref 8.9–10.3)
CO2: 26 mmol/L (ref 22–32)
Chloride: 106 mmol/L (ref 101–111)
Creatinine, Ser: 0.84 mg/dL (ref 0.44–1.00)
GFR calc Af Amer: >60 mL/min (ref >60)
GFR calc non Af Amer: >60 mL/min (ref >60)
GLUCOSE: 92 mg/dL (ref 65–99)
Potassium: 4 mmol/L (ref 3.5–5.1)
SODIUM: 143 mmol/L (ref 135–145)
TOTAL PROTEIN: 6.7 g/dL (ref 6.5–8.1)

## 2015-02-03 LAB — LIPASE, BLOOD: LIPASE: 19 U/L (ref 11–51)

## 2015-02-03 LAB — I-STAT CG4 LACTIC ACID, ED: Lactic Acid, Venous: 0.89 mmol/L (ref 0.5–2.0)

## 2015-02-03 MED ORDER — MORPHINE SULFATE (PF) 4 MG/ML IV SOLN
6.0000 mg | Freq: Once | INTRAVENOUS | Status: AC
  Administered 2015-02-04: 4 mg via INTRAVENOUS
  Filled 2015-02-03: qty 2

## 2015-02-03 MED ORDER — IOHEXOL 300 MG/ML  SOLN: 25.0000 mL | INTRAMUSCULAR | Status: AC

## 2015-02-03 MED ORDER — SODIUM CHLORIDE 0.9 % IV BOLUS (SEPSIS)
1000.0000 mL | Freq: Once | INTRAVENOUS | Status: AC
Start: 2015-02-03 — End: 2015-02-04
  Administered 2015-02-03: 1000 mL via INTRAVENOUS

## 2015-02-03 MED ORDER — MORPHINE SULFATE (PF) 4 MG/ML IV SOLN
6.0000 mg | Freq: Once | INTRAVENOUS | Status: AC
Start: 2015-02-03 — End: 2015-02-03
  Administered 2015-02-03: 6 mg via INTRAVENOUS
  Filled 2015-02-03: qty 2

## 2015-02-03 NOTE — ED Notes (Signed)
Up to the br  Tolerated well.  Drinking her last bottle of contrast.. Pain a little better

## 2015-02-03 NOTE — ED Notes (Addendum)
Pt here for mid abd pain that started a few days ago and worse with nausea. Denies diarrhea and vomiting. sts also cough

## 2015-02-03 NOTE — ED Notes (Signed)
The pt is c/o abd pain.  Her last bm was Wednesday  She has been taking narcotic med s for pain.  Her regular doctor gave her pain meds for the pain
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
## 2015-02-03 NOTE — ED Notes (Signed)
Unable to start her iv

## 2015-02-04 ENCOUNTER — Encounter (HOSPITAL_COMMUNITY): Payer: Self-pay | Admitting: Radiology

## 2015-02-04 DIAGNOSIS — R109 Unspecified abdominal pain: Secondary | ICD-10-CM | POA: Diagnosis not present

## 2015-02-04 LAB — I-STAT CG4 LACTIC ACID, ED: Lactic Acid, Venous: 0.64 mmol/L (ref 0.5–2.0)

## 2015-02-04 MED ORDER — IOHEXOL 300 MG/ML  SOLN
100.0000 mL | Freq: Once | INTRAMUSCULAR | Status: AC | PRN
  Administered 2015-02-04: 75 mL via INTRAVENOUS

## 2015-02-04 MED ORDER — HYDROCODONE-ACETAMINOPHEN 5-325 MG PO TABS: 1.0000 | ORAL_TABLET | Freq: Four times a day (QID) | ORAL | Status: DC | PRN

## 2015-02-04 MED ORDER — HYDROCODONE-ACETAMINOPHEN 5-325 MG PO TABS
1.0000 | ORAL_TABLET | Freq: Once | ORAL | Status: AC
  Administered 2015-02-04: 1 via ORAL
  Filled 2015-02-04: qty 1

## 2015-02-04 NOTE — Discharge Instructions (Signed)
Your symptoms are from the abdominal hernia. Please take clear liquid diet for the next 3 days.  Return to the ER if your pain gets worse, you cannot tolerate any fluid intake. See the Surgeon as soon as possible.   Hernia, Adult A hernia is the bulging of an organ or tissue through a weak spot in the muscles of the abdomen (abdominal wall). Hernias develop most often near the navel or groin. There are many kinds of hernias. Common kinds include:  Femoral hernia. This kind of hernia develops under the groin in the upper thigh area.  Inguinal hernia. This kind of hernia develops in the groin or scrotum.  Umbilical hernia. This kind of hernia develops near the navel.  Hiatal hernia. This kind of hernia causes part of the stomach to be pushed up into the chest.  Incisional hernia. This kind of hernia bulges through a scar from an abdominal surgery. CAUSES This condition may be caused by:  Heavy lifting.  Coughing over a long period of time.  Straining to have a bowel movement.  An incision made during an abdominal surgery.  A birth defect (congenital defect).  Excess weight or obesity.  Smoking.  Poor nutrition.  Cystic fibrosis.  Excess fluid in the abdomen.  Undescended testicles. SYMPTOMS Symptoms of a hernia include:  A lump on the abdomen. This is the first sign of a hernia. The lump may become more obvious with standing, straining, or coughing. It may get bigger over time if it is not treated or if the condition causing it is not treated.  Pain. A hernia is usually painless, but it may become painful over time if treatment is delayed. The pain is usually dull and may get worse with standing or lifting heavy objects. Sometimes a hernia gets tightly squeezed in the weak spot (strangulated) or stuck there (incarcerated) and causes additional symptoms. These symptoms may include:  Vomiting.  Nausea.  Constipation.  Irritability. DIAGNOSIS A hernia may be  diagnosed with:  A physical exam. During the exam your health care provider may ask you to cough or to make a specific movement, because a hernia is usually more visible when you move.  Imaging tests. These can include:  X-rays.  Ultrasound.  CT scan. TREATMENT A hernia that is small and painless may not need to be treated. A hernia that is large or painful may be treated with surgery. Inguinal hernias may be treated with surgery to prevent incarceration or strangulation. Strangulated hernias are always treated with surgery, because lack of blood to the trapped organ or tissue can cause it to die. Surgery to treat a hernia involves pushing the bulge back into place and repairing the weak part of the abdomen. HOME CARE INSTRUCTIONS  Avoid straining.  Do not lift anything heavier than 10 lb (4.5 kg).  Lift with your leg muscles, not your back muscles. This helps avoid strain.  When coughing, try to cough gently.  Prevent constipation. Constipation leads to straining with bowel movements, which can make a hernia worse or cause a hernia repair to break down. You can prevent constipation by:  Eating a high-fiber diet that includes plenty of fruits and vegetables.  Drinking enough fluids to keep your urine clear or pale yellow. Aim to drink 6-8 glasses of water per day.  Using a stool softener as directed by your health care provider.  Lose weight, if you are overweight.  Do not use any tobacco products, including cigarettes, chewing tobacco, or electronic cigarettes.  If you need help quitting, ask your health care provider.  Keep all follow-up visits as directed by your health care provider. This is important. Your health care provider may need to monitor your condition. SEEK MEDICAL CARE IF:  You have swelling, redness, and pain in the affected area.  Your bowel habits change. SEEK IMMEDIATE MEDICAL CARE IF:  You have a fever.  You have abdominal pain that is getting  worse.  You feel nauseous or you vomit.  You cannot push the hernia back in place by gently pressing on it while you are lying down.  The hernia:  Changes in shape or size.  Is stuck outside the abdomen.  Becomes discolored.  Feels hard or tender.   This information is not intended to replace advice given to you by your health care provider. Make sure you discuss any questions you have with your health care provider.   Document Released: 01/14/2005 Document Revised: 02/04/2014 Document Reviewed: 11/24/2013 Elsevier Interactive Patient Education [employer].

## 2015-02-04 NOTE — ED Notes (Signed)
To ct

## 2015-02-04 NOTE — ED Provider Notes (Signed)
  Physical Exam  BP 121/68 mmHg  Pulse 76  Temp(Src) 98.3 F (36.8 C) (Oral)  Resp 16  SpO2 96%  Physical Exam  ED Course  Procedures  MDM  PT comes in with cc of abd pain. CT scan ordered and shows known hernia, with surrounding inflammation. Pt reassessed - and she reports that her pain is lot better at this point. There is no obstruction per CT. Results discussed with the patient. She has had abd surgery in the past. She has been advised to see Gen Surg soon. Strict ER return precautions discussed. Pt advised to be on clear liquid diet. Tolerated po in the ER with the oral contrast.       Varney Biles, MD 02/04/15 [id number]

## 2015-02-04 NOTE — ED Notes (Signed)
The pt has had a 1 1/2 bottle of her oral contrast  c-t will take her now

## 2015-02-04 NOTE — ED Notes (Signed)
To br  C/o pain still

## 2015-02-08 ENCOUNTER — Ambulatory Visit: Payer: Medicare Other

## 2015-02-09 ENCOUNTER — Ambulatory Visit: Payer: Medicare Other

## 2015-02-09 ENCOUNTER — Ambulatory Visit (INDEPENDENT_AMBULATORY_CARE_PROVIDER_SITE_OTHER): Payer: Medicare Other

## 2015-02-09 DIAGNOSIS — J309 Allergic rhinitis, unspecified: Secondary | ICD-10-CM | POA: Diagnosis not present

## 2015-02-10 ENCOUNTER — Telehealth: Payer: Self-pay | Admitting: Internal Medicine

## 2015-02-10 ENCOUNTER — Ambulatory Visit (INDEPENDENT_AMBULATORY_CARE_PROVIDER_SITE_OTHER): Payer: Medicare Other

## 2015-02-10 DIAGNOSIS — J309 Allergic rhinitis, unspecified: Secondary | ICD-10-CM | POA: Diagnosis not present

## 2015-02-10 NOTE — Telephone Encounter (Signed)
Allergy Serum Extract Date Mixed: 02/10/15 Vial: 1 Strength: 1:10 Here/Mail/Pick Up: here Mixed By: tbs Last OV: 08/04/14 Pending OV: 08/04/15

## 2015-02-14 ENCOUNTER — Ambulatory Visit: Payer: Medicare Other

## 2015-02-14 ENCOUNTER — Ambulatory Visit (INDEPENDENT_AMBULATORY_CARE_PROVIDER_SITE_OTHER): Payer: Medicare Other

## 2015-02-14 ENCOUNTER — Ambulatory Visit: Payer: Self-pay | Admitting: Surgery

## 2015-02-14 DIAGNOSIS — J309 Allergic rhinitis, unspecified: Secondary | ICD-10-CM

## 2015-02-14 DIAGNOSIS — K432 Incisional hernia without obstruction or gangrene: Secondary | ICD-10-CM | POA: Diagnosis not present

## 2015-02-14 NOTE — H&P (Signed)
History of Present Illness [name]. Melissa Uhde MD; 02/14/2015 5:09 PM) Patient words: abd pain.  The patient is a 65 year old female who presents with an incisional hernia. This is a 65 year old female who initially presented in January 2015 with abdominal pain, nausea, and vomiting. A CT scan showed a mass at the splenic flexure. Sigmoidoscopy showed a large friable fungating tumor obstructing the splenic flexure. Biopsy revealed adenocarcinoma. I performed a partial colectomy with a temporary proximal transverse colostomy in the right upper quadrant. She had a long Hartman's pouch at the distal descending colon. She had a prolonged hospital course and required temporary placement in a skilled nursing facility. She completed a course of chemotherapy (Xeloda). She had a colonoscopy last fall which revealed no other findings in the proximal colon. On November 25, 2013, she underwent colostomy reversal. Again she had a prolonged hospital stay because of her medical comorbidities. She seems to be improving. In January 2016, she underwent a follow-up CT scan of the chest, abdomen, and pelvis. 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 She also has ventral hernias in the upper midline as well as in the right-sided ostomy site. The upper midline hernia is causing some discomfort. On 04/06/14, she underwent small bowel resection with primary repair of two ventral incisional hernias with Novafil sutures. 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 Over the last several months, she has developed enlargement of 2 bulges in the epigastrium. Occasionally is close fairly severe pain. She was seen in the emergency department last week where a CT scan revealed 2 ventral incisional hernias containing small bowel. There was no sign of obstruction. She presents now  to discuss hernia repair.   She last saw [name] in December and is stable from a colon cancer standpoint.  CLINICAL DATA: Acute onset of severe mid abdominal pain, bloating, gas and nausea. Initial encounter.  EXAM: CT ABDOMEN AND PELVIS WITH CONTRAST  TECHNIQUE: Multidetector CT imaging of the abdomen and pelvis was performed using the standard protocol following bolus administration of intravenous contrast.  CONTRAST: 30mL OMNIPAQUE IOHEXOL 300 MG/ML SOLN  COMPARISON: CT of the abdomen and pelvis performed 03/29/2014  FINDINGS: Minimal bibasilar atelectasis is noted.  There is dilatation of small bowel loops up to 3.8 cm in maximal diameter, with small bowel wall thickening and soft tissue inflammation noted about two small anterior abdominal wall hernias. There is narrowing at the entry into the right-sided hernia, and at the exit from the left-sided hernia, with dilatation of the short bowel segment between the two hernias.  However, contrast does extend through the dilated loops into the relatively decompressed small bowel, and minimally into the cecum. Findings are compatible with dysmotility and inflammation secondary to the anterior abdominal wall hernias.  A small to moderate right lower quadrant anterior abdominal wall hernia is also seen, containing a short segment of distal ileum, without evidence of obstruction.  A dilated small bowel anastomosis is noted at the left mid abdomen, likely within normal limits.  The liver and spleen are unremarkable in appearance. The patient is status post cholecystectomy, with clips noted along the gallbladder fossa. The pancreas and adrenal glands are unremarkable.  The kidneys are unremarkable in appearance. There is no evidence of hydronephrosis. No renal or ureteral stones are seen. No perinephric stranding is appreciated.  The stomach is within normal limits. No acute vascular abnormalities are  seen.  The patient is status post appendectomy. The patient is status post partial colectomy. The remaining colon is grossly unremarkable.  The bladder is mildly distended and grossly unremarkable. The patient is status post hysterectomy. No suspicious adnexal masses are seen. The right ovary is unremarkable in appearance. No inguinal lymphadenopathy is seen.  No acute osseous abnormalities are identified. There is grade 1 anterolisthesis of L4 on L5, reflecting underlying facet disease.  IMPRESSION: 1. Dilatation of small bowel loops up to 3.8 cm in maximal diameter, with small bowel wall thickening and soft tissue inflammation about two small anterior abdominal wall hernias. Narrowing at the entry into the right-sided hernia, and at the exit from the left-sided hernia, with dilatation of the small bowel segment between the two hernias. Contrast does extend into the more distal relatively decompressed small bowel, and minimally to the cecum. Findings compatible with dysmotility and inflammation secondary to the anterior abdominal wall hernias. 2. An additional small to moderate right lower quadrant anterior abdominal wall hernia contains a short segment of distal ileum, without evidence for obstruction. 3. Dilated small bowel anastomosis at the left mid abdomen remains within normal limits. These results were called by telephone at the time of interpretation on 02/04/2015 at 1:01 am to Dr. [name], who verbally acknowledged these results.   Electronically Signed By: Garald Balding M.D. On: 02/04/2015 01:02    Allergies (Ammie Eversole, LPN; D34-534 624THL PM) No Known Drug Allergies 12/31/2013  Medication History (Ammie Eversole, LPN; D34-534 075-GRM PM) Advair Diskus (250-50MCG/DOSE Aero Pow Br Act, Inhalation as needed) Active. Fluconazole (200MG  Tablet, Oral) Active. Furosemide (20MG  Tablet, Oral) Active. Montelukast Sodium (10MG  Tablet, Oral)  Active. Omeprazole (20MG  Capsule DR, Oral) Active. Potassium Chloride ER (10MEQ Capsule ER, Oral) Active. Naproxen (500MG  Tablet, Oral) Active. ProAir HFA (108 (90 Base)MCG/ACT Aerosol Soln, Inhalation) Active. Proventil ((2.5 MG/3ML)0.083% Nebulized Soln, Inhalation) Active. Aspirin (81MG  Tablet Chewable, Oral) Active. Vitamin D (Cholecalciferol) (1000UNIT Capsule, Oral) Active. Vitamin B-12 (1000MCG Tablet, Oral) Active. Ferrous Sulfate (325 (65 Fe)MG Tablet, Oral) Active. RaNITidine HCl (150MG  Tablet, Oral) Active. Medications Reconciled    Vitals (Ammie Eversole LPN; D34-534 624THL PM) 02/14/2015 3:34 PM Weight: 225.8 lb Height: 62in Body Surface Area: 2.01 m Body Mass Index: 41.3 kg/m  Temp.: 97.14F(Oral)  Pulse: 74 (Regular)  BP: 132/88 (Sitting, Left Arm, Standard)      Physical Exam Rodman Key K. Sukanya Goldblatt MD; 02/14/2015 5:09 PM)  The physical exam findings are as follows: Note:WDWN in NAD HEENT: EOMI, sclera anicteric Neck: No masses, no thyromegaly Lungs: CTA bilaterally; normal respiratory effort CV: Regular rate and rhythm; no murmurs Abd: +bowel sounds, soft, incisions well-healed; tender in epigastrium - reducible hernias when supine Ext: Well-perfused; no edema Skin: Warm, dry; no sign of jaundice    Assessment & Plan Rodman Key K. 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 Schedule for Surgery - Laparoscopic ventral hernia repair with mesh. The surgical procedure has been discussed with the patient. Potential risks, benefits, alternative treatments, and expected outcomes have been explained. All of the patient's questions at this time have been answered. The likelihood of reaching the patient's treatment goal is good. The patient understand the proposed surgical procedure and  wishes to proceed.   [name]. Georgette Dover, MD, [facility]  General/ Trauma Surgery  02/14/2015 5:10 PM

## 2015-02-18 ENCOUNTER — Other Ambulatory Visit: Payer: Self-pay | Admitting: Internal Medicine

## 2015-02-21 ENCOUNTER — Ambulatory Visit (INDEPENDENT_AMBULATORY_CARE_PROVIDER_SITE_OTHER): Payer: Medicare Other

## 2015-02-21 DIAGNOSIS — J309 Allergic rhinitis, unspecified: Secondary | ICD-10-CM | POA: Diagnosis not present

## 2015-02-28 ENCOUNTER — Encounter: Payer: Self-pay | Admitting: Internal Medicine

## 2015-02-28 ENCOUNTER — Ambulatory Visit (INDEPENDENT_AMBULATORY_CARE_PROVIDER_SITE_OTHER): Payer: Medicare Other

## 2015-02-28 ENCOUNTER — Ambulatory Visit (INDEPENDENT_AMBULATORY_CARE_PROVIDER_SITE_OTHER): Payer: Medicare Other | Admitting: Internal Medicine

## 2015-02-28 ENCOUNTER — Encounter (HOSPITAL_COMMUNITY): Payer: Self-pay

## 2015-02-28 ENCOUNTER — Encounter (HOSPITAL_COMMUNITY)
Admission: RE | Admit: 2015-02-28 | Discharge: 2015-02-28 | Disposition: A | Discharge status: Home / Self Care | Payer: Medicare Other | Source: Ambulatory Visit | Attending: Surgery | Admitting: Surgery

## 2015-02-28 VITALS — BP 128/84 | HR 66 | Wt 225.0 lb

## 2015-02-28 VITALS — BP 126/70 | HR 54 | Ht 62.0 in | Wt 225.2 lb

## 2015-02-28 DIAGNOSIS — K432 Incisional hernia without obstruction or gangrene: Secondary | ICD-10-CM | POA: Insufficient documentation

## 2015-02-28 DIAGNOSIS — I10 Essential (primary) hypertension: Secondary | ICD-10-CM | POA: Diagnosis not present

## 2015-02-28 DIAGNOSIS — G4733 Obstructive sleep apnea (adult) (pediatric): Secondary | ICD-10-CM | POA: Diagnosis not present

## 2015-02-28 DIAGNOSIS — J302 Other seasonal allergic rhinitis: Secondary | ICD-10-CM

## 2015-02-28 DIAGNOSIS — G4700 Insomnia, unspecified: Secondary | ICD-10-CM | POA: Diagnosis not present

## 2015-02-28 DIAGNOSIS — C184 Malignant neoplasm of transverse colon: Secondary | ICD-10-CM

## 2015-02-28 DIAGNOSIS — J309 Allergic rhinitis, unspecified: Secondary | ICD-10-CM | POA: Diagnosis not present

## 2015-02-28 DIAGNOSIS — K439 Ventral hernia without obstruction or gangrene: Secondary | ICD-10-CM | POA: Insufficient documentation

## 2015-02-28 DIAGNOSIS — J3089 Other allergic rhinitis: Secondary | ICD-10-CM

## 2015-02-28 DIAGNOSIS — J45909 Unspecified asthma, uncomplicated: Secondary | ICD-10-CM

## 2015-02-28 DIAGNOSIS — Z01812 Encounter for preprocedural laboratory examination: Secondary | ICD-10-CM | POA: Diagnosis not present

## 2015-02-28 LAB — CBC
HCT: 39.7 % (ref 36.0–46.0)
HEMOGLOBIN: 12.9 g/dL (ref 12.0–15.0)
MCH: 24.4 pg — AB (ref 26.0–34.0)
MCHC: 32.5 g/dL (ref 30.0–36.0)
MCV: 75 fL — ABNORMAL LOW (ref 78.0–100.0)
Platelets: 217 10*3/uL (ref 150–400)
RBC: 5.29 MIL/uL — ABNORMAL HIGH (ref 3.87–5.11)
RDW: 15.9 % — AB (ref 11.5–15.5)
WBC: 5.7 10*3/uL (ref 4.0–10.5)

## 2015-02-28 LAB — BASIC METABOLIC PANEL
ANION GAP: 11 (ref 5–15)
BUN: 9 mg/dL (ref 6–20)
CHLORIDE: 108 mmol/L (ref 101–111)
CO2: 25 mmol/L (ref 22–32)
CREATININE: 0.74 mg/dL (ref 0.44–1.00)
Calcium: 9.5 mg/dL (ref 8.9–10.3)
GFR calc non Af Amer: >60 mL/min (ref >60)
Glucose, Bld: 93 mg/dL (ref 65–99)
POTASSIUM: 3.8 mmol/L (ref 3.5–5.1)
SODIUM: 144 mmol/L (ref 135–145)

## 2015-02-28 MED ORDER — ALBUTEROL SULFATE HFA 108 (90 BASE) MCG/ACT IN AERS: INHALATION_SPRAY | RESPIRATORY_TRACT | Status: DC

## 2015-02-28 MED ORDER — VALSARTAN-HYDROCHLOROTHIAZIDE 160-25 MG PO TABS: 1.0000 | ORAL_TABLET | Freq: Every day | ORAL | Status: DC

## 2015-02-28 MED ORDER — OXYCODONE-ACETAMINOPHEN 5-325 MG PO TABS
1.0000 | ORAL_TABLET | Freq: Three times a day (TID) | ORAL | Status: DC | PRN
Start: 2015-02-28 — End: 2015-04-11

## 2015-02-28 MED ORDER — MOMETASONE FUROATE 50 MCG/ACT NA SUSP: 2.0000 | Freq: Every day | NASAL | Status: DC | PRN

## 2015-02-28 MED ORDER — FLUTICASONE FUROATE-VILANTEROL 100-25 MCG/INH IN AEPB: INHALATION_SPRAY | RESPIRATORY_TRACT | Status: DC

## 2015-02-28 MED ORDER — CETIRIZINE HCL 10 MG PO TABS: 10.0000 mg | ORAL_TABLET | Freq: Every day | ORAL | Status: DC

## 2015-02-28 MED ORDER — ALBUTEROL SULFATE (2.5 MG/3ML) 0.083% IN NEBU: 2.5000 mg | INHALATION_SOLUTION | Freq: Four times a day (QID) | RESPIRATORY_TRACT | Status: DC | PRN

## 2015-02-28 MED ORDER — POTASSIUM CHLORIDE ER 10 MEQ PO TBCR
10.0000 meq | EXTENDED_RELEASE_TABLET | Freq: Every day | ORAL | Status: DC
Start: 2015-02-28 — End: 2015-06-27

## 2015-02-28 MED ORDER — VITAMIN B-12 1000 MCG SL SUBL: 1.0000 | SUBLINGUAL_TABLET | Freq: Every day | SUBLINGUAL | Status: AC

## 2015-02-28 MED ORDER — NAPROXEN 500 MG PO TABS: 500.0000 mg | ORAL_TABLET | Freq: Two times a day (BID) | ORAL | Status: DC

## 2015-02-28 MED ORDER — MONTELUKAST SODIUM 10 MG PO TABS: 10.0000 mg | ORAL_TABLET | Freq: Every day | ORAL | Status: DC

## 2015-02-28 NOTE — Patient Instructions (Addendum)
Disregard bowel prep instructions, added by mistake.

## 2015-02-28 NOTE — Progress Notes (Signed)
Pre visit review using our clinic review tool, if applicable. No additional management support is needed unless otherwise documented below in the visit note. 

## 2015-02-28 NOTE — Assessment & Plan Note (Signed)
Chronic  Proair prn, Singulair 

## 2015-02-28 NOTE — Assessment & Plan Note (Signed)
Chronic  Diovan HCT, Furosemide 

## 2015-02-28 NOTE — Progress Notes (Signed)
Subjective:  Patient ID: [name], female    DOB: 1950/02/13  Age: 65 y.o. MRN: [id number]  CC: No chief complaint on file.   HPI Demetric Quick [place] presents for asthma, allergies, GERD, HTN f/u. C/o some abd pain at times.  Outpatient Prescriptions Prior to Visit  Medication Sig Dispense Refill  . albuterol (PROAIR HFA) 108 (90 BASE) MCG/ACT inhaler INHALE 2 PUFFS INTO THE LUNGS BY MOUTH EVERY 6 HOURS AS NEEDED FOR WHEEZING/SHORTNESS OF BREATH 25.5 each 11  . albuterol (PROVENTIL) (2.5 MG/3ML) 0.083% nebulizer solution Take 3 mLs (2.5 mg total) by nebulization every 6 (six) hours as needed for wheezing or shortness of breath. 75 mL 12  . aspirin 81 MG tablet Take 81 mg by mouth daily.     . cetirizine (ZYRTEC) 10 MG tablet Take 1 tablet (10 mg total) by mouth daily. 100 tablet 3  . Cholecalciferol (VITAMIN D) 1000 UNITS capsule Take 1,000 Units by mouth every morning.     . Cyanocobalamin (VITAMIN B-12) 1000 MCG SUBL Place 1 tablet (1,000 mcg total) under the tongue daily. 100 tablet 3  . ferrous sulfate 325 (65 FE) MG tablet Take 325 mg by mouth 3 (three) times daily with meals.     . Fluticasone-Salmeterol (ADVAIR) 250-50 MCG/DOSE AEPB 1 puff then rinse mouth, twice daily maintenance (Patient taking differently: Inhale 1 puff into the lungs 2 (two) times daily as needed (shortness of breath). ) 60 each 11  . furosemide (LASIX) 20 MG tablet Take 20-40 mg by mouth daily as needed for fluid.    Marland Kitchen HYDROcodone-acetaminophen (NORCO/VICODIN) 5-325 MG tablet Take 1 tablet by mouth every 6 (six) hours as needed. 8 tablet 0  . mometasone (NASONEX) 50 MCG/ACT nasal spray Place 2 sprays into the nose daily as needed (allergies). 17 g 11  . montelukast (SINGULAIR) 10 MG tablet Take 1 tablet (10 mg total) by mouth at bedtime. 90 tablet 3  . naproxen (NAPROSYN) 500 MG tablet TAKE 1 TABLET BY MOUTH TWICE A DAY WITH MEALS 30 tablet 0  . NONFORMULARY OR COMPOUNDED ITEM Inject 1 Applicatorful as  directed once a week. Allergy Vaccine 1:10 Given at [facility]    . omeprazole (PRILOSEC) 20 MG capsule Take 20 mg by mouth daily.    Marland Kitchen oxyCODONE-acetaminophen (PERCOCET/ROXICET) 5-325 MG per tablet Take 1 tablet by mouth every 4 (four) hours as needed for moderate pain. 40 tablet 0  . potassium chloride (K-DUR) 10 MEQ tablet Take 1 tablet (10 mEq total) by mouth daily. 90 tablet 3  . ranitidine (ZANTAC) 150 MG tablet Take 1 tablet (150 mg total) by mouth at bedtime. 30 tablet 3  . valsartan-hydrochlorothiazide (DIOVAN-HCT) 160-25 MG per tablet Take 1 tablet by mouth daily. 90 tablet 3   No facility-administered medications prior to visit.    ROS Review of Systems  Constitutional: Negative for chills, activity change, appetite change, fatigue and unexpected weight change.  HENT: Negative for congestion, mouth sores and sinus pressure.   Eyes: Negative for visual disturbance.  Respiratory: Negative for cough and chest tightness.   Gastrointestinal: Positive for abdominal distention. Negative for nausea and abdominal pain.  Genitourinary: Negative for frequency, difficulty urinating and vaginal pain.  Musculoskeletal: Positive for back pain. Negative for gait problem and neck pain.  Skin: Negative for pallor and rash.  Neurological: Negative for dizziness, tremors, weakness, numbness and headaches.  Psychiatric/Behavioral: Negative for suicidal ideas, confusion, sleep disturbance and dysphoric mood. The patient is not nervous/anxious.  Objective:  BP 128/84 mmHg  Pulse 66  Wt 225 lb (102.059 kg)  SpO2 97%  BP Readings from Last 3 Encounters:  02/28/15 128/84  02/04/15 121/68  01/10/15 135/63    Wt Readings from Last 3 Encounters:  02/28/15 225 lb (102.059 kg)  01/10/15 231 lb 14.4 oz (105.189 kg)  11/30/14 231 lb (104.781 kg)    Physical Exam  Constitutional: She appears well-developed. No distress.  HENT:  Head: Normocephalic.  Right Ear: External ear normal.    Left Ear: External ear normal.  Nose: Nose normal.  Mouth/Throat: Oropharynx is clear and moist.  Eyes: Conjunctivae are normal. Pupils are equal, round, and reactive to light. Right eye exhibits no discharge. Left eye exhibits no discharge.  Neck: Normal range of motion. Neck supple. No JVD present. No tracheal deviation present. No thyromegaly present.  Cardiovascular: Normal rate, regular rhythm and normal heart sounds.   Pulmonary/Chest: No stridor. No respiratory distress. She has no wheezes.  Abdominal: Soft. Bowel sounds are normal. She exhibits distension and mass. There is no tenderness. There is no rebound and no guarding.  Musculoskeletal: She exhibits no edema or tenderness.  Lymphadenopathy:    She has no cervical adenopathy.  Neurological: She displays normal reflexes. No cranial nerve deficit. She exhibits normal muscle tone. Coordination normal.  Skin: No rash noted. No erythema.  Psychiatric: She has a normal mood and affect. Her behavior is normal. Judgment and thought content normal.  obese Scars on abd NT  Lab Results  Component Value Date   WBC 6.8 02/03/2015   HGB 12.9 02/03/2015   HCT 40.0 02/03/2015   PLT 259 02/03/2015   GLUCOSE 92 02/03/2015   CHOL 144 07/14/2012   TRIG 59.0 07/14/2012   HDL 76.60 07/14/2012   LDLCALC 56 07/14/2012   ALT 18 02/03/2015   AST 20 02/03/2015   NA 143 02/03/2015   K 4.0 02/03/2015   CL 106 02/03/2015   CREATININE 0.84 02/03/2015   BUN 8 02/03/2015   CO2 26 02/03/2015   TSH 1.91 01/27/2013   INR 1.07 02/02/2013   HGBA1C 5.5 09/20/2013    Dg Chest 2 View  02/03/2015  CLINICAL DATA:  Cough. Abdominal pain and nausea for 5 days. History of hypertension. EXAM: CHEST  2 VIEW COMPARISON:  11/09/2014 FINDINGS: Cardiac silhouette is mildly enlarged. No mediastinal or hilar masses are noted. No evidence of adenopathy. Lungs are mildly hyperexpanded but clear. No pleural effusion or pneumothorax. Bony thorax is demineralized but  grossly intact. IMPRESSION: No active cardiopulmonary disease. Electronically Signed   By: Lajean Manes M.D.   On: 02/03/2015 19:23   Ct Abdomen Pelvis W Contrast  02/04/2015  CLINICAL DATA:  Acute onset of severe mid abdominal pain, bloating, gas and nausea. Initial encounter. EXAM: CT ABDOMEN AND PELVIS WITH CONTRAST TECHNIQUE: Multidetector CT imaging of the abdomen and pelvis was performed using the standard protocol following bolus administration of intravenous contrast. CONTRAST:  56mL OMNIPAQUE IOHEXOL 300 MG/ML  SOLN COMPARISON:  CT of the abdomen and pelvis performed 03/29/2014 FINDINGS: Minimal bibasilar atelectasis is noted. There is dilatation of small bowel loops up to 3.8 cm in maximal diameter, with small bowel wall thickening and soft tissue inflammation noted about two small anterior abdominal wall hernias. There is narrowing at the entry into the right-sided hernia, and at the exit from the left-sided hernia, with dilatation of the short bowel segment between the two hernias. However, contrast does extend through the dilated loops into the relatively  decompressed small bowel, and minimally into the cecum. Findings are compatible with dysmotility and inflammation secondary to the anterior abdominal wall hernias. A small to moderate right lower quadrant anterior abdominal wall hernia is also seen, containing a short segment of distal ileum, without evidence of obstruction. A dilated small bowel anastomosis is noted at the left mid abdomen, likely within normal limits. The liver and spleen are unremarkable in appearance. The patient is status post cholecystectomy, with clips noted along the gallbladder fossa. The pancreas and adrenal glands are unremarkable. The kidneys are unremarkable in appearance. There is no evidence of hydronephrosis. No renal or ureteral stones are seen. No perinephric stranding is appreciated. The stomach is within normal limits. No acute vascular abnormalities are seen.  The patient is status post appendectomy. The patient is status post partial colectomy. The remaining colon is grossly unremarkable. The bladder is mildly distended and grossly unremarkable. The patient is status post hysterectomy. No suspicious adnexal masses are seen. The right ovary is unremarkable in appearance. No inguinal lymphadenopathy is seen. No acute osseous abnormalities are identified. There is grade 1 anterolisthesis of L4 on L5, reflecting underlying facet disease. IMPRESSION: 1. Dilatation of small bowel loops up to 3.8 cm in maximal diameter, with small bowel wall thickening and soft tissue inflammation about two small anterior abdominal wall hernias. Narrowing at the entry into the right-sided hernia, and at the exit from the left-sided hernia, with dilatation of the small bowel segment between the two hernias. Contrast does extend into the more distal relatively decompressed small bowel, and minimally to the cecum. Findings compatible with dysmotility and inflammation secondary to the anterior abdominal wall hernias. 2. An additional small to moderate right lower quadrant anterior abdominal wall hernia contains a short segment of distal ileum, without evidence for obstruction. 3. Dilated small bowel anastomosis at the left mid abdomen remains within normal limits. These results were called by telephone at the time of interpretation on 02/04/2015 at 1:01 am to Dr. [name], who verbally acknowledged these results. Electronically Signed   By: Garald Balding M.D.   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  Other orders -     cetirizine (ZYRTEC) 10 MG tablet; Take 1 tablet (10 mg total) by mouth daily. -     Cyanocobalamin (VITAMIN B-12) 1000 MCG SUBL; Place 1 tablet  (1,000 mcg total) under the tongue daily. -     naproxen (NAPROSYN) 500 MG tablet; Take 1 tablet (500 mg total) by mouth 2 (two) times daily with a meal. -     potassium chloride (K-DUR) 10 MEQ tablet; Take 1 tablet (10 mEq total) by mouth daily. -     valsartan-hydrochlorothiazide (DIOVAN-HCT) 160-25 MG tablet; Take 1 tablet by mouth daily.   I am having Ms. Quick maintain her Vitamin D, aspirin, ferrous sulfate, Vitamin B-12, potassium chloride, cetirizine, furosemide, omeprazole, valsartan-hydrochlorothiazide, Fluticasone-Salmeterol, albuterol, montelukast, mometasone, albuterol, NONFORMULARY OR COMPOUNDED ITEM, oxyCODONE-acetaminophen, ranitidine, HYDROcodone-acetaminophen, and naproxen.  No orders of the defined types were placed in this encounter.     Follow-up: No Follow-up on file.  Walker Kehr, MD

## 2015-02-28 NOTE — Progress Notes (Signed)
Subjective:    Patient ID: [name], female    DOB: 1950-08-15, 65 y.o.   MRN: [id number]  HPI 08/07/10- 60 yoF never smoker followed for asthma, allergic rhinitis,, OSA, obesity/hypoventilation, complicated by GERD, arthritis, HBP Last here January 31, 2010 CPAP still used every night, works well at [employer]. Allergy vaccine at 1;10, still seems to have helped.  Recent hot weather has been hard on her- coughing more, wheezing more, interfering with sleep. Sputum trace yellow. Had temp 101, no sore throat. Some nausea and vomiting  No chest pain.    08/23/11-  61 yoF never smoker followed for asthma, allergic rhinitis,, OSA, obesity/hypoventilation, complicated by GERD, arthritis, HBP Patient states a little better since last visit. c/o dry cough x 1 week.  Denies sob, wheezing, chest pain, and chest tightness.  She likes humid weather that we have had this year better than very dry weather. She reports doing well with her allergy vaccine at 1:10 GH. Using rescue inhaler or nebulizer about once daily. She depends on her nebulizer/ albuterol neb solution especially if she's a little tighter. We discussed appropriate use. Walking regularly 2-3 times per week. She reports good compliance and control with her CPAP 9/ [facility].  08/24/12- 61 yoF never smoker followed for asthma, allergic rhinitis,, OSA, obesity/hypoventilation, complicated by GERD, arthritis, HBP FOLLOWS FOR: pt reports breathing about the same since last visit-- having a little dry ocugh d/t hot weather-- denies any other concerns at this time  Allergy vaccine 1:10 GH OSA-CPAP 9/ [facility]. She says she uses it all night every night. Minor dry cough. She is aware of mild reflux but no major events.  08/24/13- 63 yoF never smoker followed for asthma, allergic rhinitis,, OSA, obesity/hypoventilation, complicated by GERD, arthritis, HBP, Colon Ca/ L hemicolectomy FOLLOWS FOR: c/o mouth dryness in mornings after using  CPAP 9/ [facility], nonprod cough after being in the heat.  Pt wears cpap 6-8 hours nightly, no problems with mask/supplies.  Tolerating allergy injections well.  Allergy vaccine 1:10 GH Had colon cancer surgery in January and pending reversal of colostomy in September with no respiratory problems so far. CXR 02/09/13 IMPRESSION:  1. Enteric tube tip and side port projects below the left  hemidiaphragm. Otherwise, stable position of remaining support  apparatus.  2. Improved aeration of the lungs with persistent perihilar and  medial basilar opacities, likely atelectasis.  3. Pulmonary venous congestion without frank evidence of edema.  Electronically Signed  By: Sandi Mariscal M.D.  On: 02/09/2013 07:46  08[birth year]04[birth year]14-64 yoF never smoker followed for asthma, allergic rhinitis,, OSA, obesity/hypoventilation, complicated by GERD, arthritis, HBP, Colon Ca/ L hemicolectomy Allergy Vaccine 1:10 GH CPAP 9/[facility] FOLLOWS FOR:No DL at this time-will need to place order for one-DME is [facility]. CPAP 9/ [facility] for 6-7 hours each night. Feeling rested. Leak in CPAP hose. Continues allergy vaccine 1:10 GH-doing well.  Lies down to nap but doesn't sleep. Long sleep latency  ROS-see HPI Constitutional:   No-   weight loss, night sweats, fevers, chills, fatigue, lassitude. HEENT:   No-  headaches, difficulty swallowing, tooth/dental problems, sore throat,       No-  sneezing, itching, ear ache, nasal congestion, post nasal drip,  CV:  No-   chest pain, orthopnea, PND, swelling in lower extremities, anasarca, dizziness, palpitations Resp: No- acute  shortness of breath with exertion or at rest.              No-   productive cough,  +non-productive cough,  No- coughing up of blood.              No-   change in color of mucus.  No-sustained wheezing.   Skin: No-   rash or lesions. GI:  + heartburn, indigestion, no-abdominal pain, nausea, vomiting,  GU: No  complaint MS:  No-   joint pain or swelling.   Neuro-      nothing unusual Psych:  No- change in mood or affect. No depression or anxiety.  No memory loss.  OBJ- Physical Exam General- Alert, Oriented, Affect-appropriate, Distress- none acute, +obese Skin- rash-none, lesions- none, excoriation- none Lymphadenopathy- none Head- atraumatic            Eyes- Gross vision intact, PERRLA, conjunctivae and secretions clear            Ears- Hearing, canals-normal            Nose- Clear, no-Septal dev, mucus, polyps, erosion, perforation             Throat- Mallampati II , mucosa clear,not dry , drainage- none, tonsils- atrophic, + hoarse Neck- flexible , trachea midline, no stridor , thyroid nl, carotid no bruit Chest - symmetrical excursion , unlabored           Heart/CV- RRR , no murmur , no gallop  , no rub, nl s1 s2                           - JVD- none , edema- none, stasis changes- none, varices- none           Lung- clear to P&A, wheeze- none, cough-none , dullness-none, rub- none           Chest wall-  Abd-  Br/ Gen/ Rectal- Not done, not indicated Extrem- cyanosis- none, clubbing, none, atrophy- none, strength- nl Neuro- grossly intact to observation

## 2015-02-28 NOTE — Assessment & Plan Note (Signed)
1/17 Surgery  Next week - Laparoscopic ventral hernia repair with mesh. Dr Georgette Dover

## 2015-02-28 NOTE — Patient Instructions (Signed)
We can continue allergy vaccine 1:10 for another year  We can continue CPAP 9/ Advanced  For trouble getting to sleep, try otc melatonin and/ or ZZZquil  Since insurance won't cover Advair, try sample and script Breo Ellipta  Inhale 1 puff, then rinse mouth, once daily

## 2015-02-28 NOTE — Pre-Procedure Instructions (Signed)
EMMELINA LILJENQUIST  02/28/2015      [facility] [place], [place] [address] [place] [address] Phone: [phone] Fax: [phone]    Your procedure is scheduled on February 7th, Tuesday   Report to [facility] Admitting at 8:00 am  Call this number if you have problems the morning of surgery:  [phone]   Remember:  Do not eat food or drink liquids after midnight Monday  Take these medicines the morning of surgery with A SIP OF WATER :  Norco, Singular, Omeprazole or Zantac. Please use Nasonex and Albutero   Do not wear jewelry, make-up or nail polish.  Do not wear lotions, powders, or perfumes.  You may NOT wear deodorant the day of surgery.  Do not shave underarms & legs 48 hours prior to surgery.    Do not bring valuables to the hospital.  [facility] is not responsible for any belongings or valuables.  Contacts, dentures or bridgework may not be worn into surgery.  Leave your suitcase in the car.  After surgery it may be brought to your room. For patients admitted to the hospital, discharge time will be determined by your treatment team.  Name and phone number of your driver:     Please read over the following fact sheets that you were given. Pain Booklet and MRSA Information

## 2015-02-28 NOTE — Assessment & Plan Note (Signed)
Dr Tsuei Dr Sherrill Colostomy s/p reversal in 10/15 

## 2015-03-01 DIAGNOSIS — G4700 Insomnia, unspecified: Secondary | ICD-10-CM | POA: Insufficient documentation

## 2015-03-01 NOTE — Assessment & Plan Note (Signed)
She is compliant with CPAP and pressure seems good. No changes required.

## 2015-03-01 NOTE — Assessment & Plan Note (Signed)
Difficulty initiating and maintaining sleep Educated good sleep habits and environment. Okay to try OTC medication for sleep if needed

## 2015-03-01 NOTE — Assessment & Plan Note (Signed)
Okay to continue allergy vaccine 1 more year and probably stop

## 2015-03-06 MED ORDER — CHLORHEXIDINE GLUCONATE 4 % EX LIQD: 1.0000 "application " | Freq: Once | CUTANEOUS | Status: DC

## 2015-03-06 MED ORDER — DEXTROSE 5 % IV SOLN
3.0000 g | INTRAVENOUS | Status: AC
  Administered 2015-03-07: 3 g via INTRAVENOUS
  Filled 2015-03-06: qty 3000

## 2015-03-07 ENCOUNTER — Inpatient Hospital Stay (HOSPITAL_COMMUNITY): Payer: Medicare Other | Admitting: Certified Registered Nurse Anesthetist

## 2015-03-07 ENCOUNTER — Inpatient Hospital Stay (HOSPITAL_COMMUNITY)
Admission: RE | Admit: 2015-03-07 | Discharge: 2015-03-14 | DRG: 336 | Disposition: A | Discharge status: Home Health | Payer: Medicare Other | Source: Ambulatory Visit | Attending: Surgery | Admitting: Surgery

## 2015-03-07 ENCOUNTER — Ambulatory Visit: Payer: Medicare Other | Admitting: Internal Medicine

## 2015-03-07 ENCOUNTER — Encounter (HOSPITAL_COMMUNITY): Payer: Self-pay | Admitting: Certified Registered Nurse Anesthetist

## 2015-03-07 ENCOUNTER — Encounter (HOSPITAL_COMMUNITY)
Admission: RE | Disposition: A | Discharge status: Home Health | Payer: Self-pay | Source: Ambulatory Visit | Attending: Surgery

## 2015-03-07 DIAGNOSIS — K432 Incisional hernia without obstruction or gangrene: Principal | ICD-10-CM | POA: Diagnosis present

## 2015-03-07 DIAGNOSIS — C189 Malignant neoplasm of colon, unspecified: Secondary | ICD-10-CM | POA: Diagnosis present

## 2015-03-07 DIAGNOSIS — K660 Peritoneal adhesions (postprocedural) (postinfection): Secondary | ICD-10-CM | POA: Diagnosis present

## 2015-03-07 DIAGNOSIS — Z6841 Body Mass Index (BMI) 40.0 and over, adult: Secondary | ICD-10-CM

## 2015-03-07 DIAGNOSIS — Z90710 Acquired absence of both cervix and uterus: Secondary | ICD-10-CM | POA: Diagnosis not present

## 2015-03-07 DIAGNOSIS — Z9049 Acquired absence of other specified parts of digestive tract: Secondary | ICD-10-CM | POA: Diagnosis not present

## 2015-03-07 DIAGNOSIS — Z7982 Long term (current) use of aspirin: Secondary | ICD-10-CM | POA: Diagnosis not present

## 2015-03-07 DIAGNOSIS — Z79899 Other long term (current) drug therapy: Secondary | ICD-10-CM | POA: Diagnosis not present

## 2015-03-07 DIAGNOSIS — J449 Chronic obstructive pulmonary disease, unspecified: Secondary | ICD-10-CM | POA: Diagnosis not present

## 2015-03-07 DIAGNOSIS — R109 Unspecified abdominal pain: Secondary | ICD-10-CM | POA: Diagnosis not present

## 2015-03-07 DIAGNOSIS — K439 Ventral hernia without obstruction or gangrene: Secondary | ICD-10-CM | POA: Diagnosis not present

## 2015-03-07 DIAGNOSIS — M545 Low back pain: Secondary | ICD-10-CM | POA: Diagnosis not present

## 2015-03-07 HISTORY — PX: INSERTION OF MESH: SHX5868

## 2015-03-07 HISTORY — PX: VENTRAL HERNIA REPAIR: SHX424

## 2015-03-07 LAB — CBC
HCT: 38 % (ref 36.0–46.0)
HEMOGLOBIN: 12.1 g/dL (ref 12.0–15.0)
MCH: 23.8 pg — AB (ref 26.0–34.0)
MCHC: 31.8 g/dL (ref 30.0–36.0)
MCV: 74.7 fL — ABNORMAL LOW (ref 78.0–100.0)
Platelets: 208 10*3/uL (ref 150–400)
RBC: 5.09 MIL/uL (ref 3.87–5.11)
RDW: 15.8 % — AB (ref 11.5–15.5)
WBC: 8.5 10*3/uL (ref 4.0–10.5)

## 2015-03-07 LAB — CREATININE, SERUM
Creatinine, Ser: 0.85 mg/dL (ref 0.44–1.00)
GFR calc Af Amer: >60 mL/min (ref >60)

## 2015-03-07 SURGERY — REPAIR, HERNIA, VENTRAL, LAPAROSCOPIC
Anesthesia: General | Site: Abdomen

## 2015-03-07 MED ORDER — SUGAMMADEX SODIUM 200 MG/2ML IV SOLN
INTRAVENOUS | Status: AC
  Filled 2015-03-07: qty 2

## 2015-03-07 MED ORDER — POTASSIUM CHLORIDE IN NACL 20-0.9 MEQ/L-% IV SOLN
INTRAVENOUS | Status: DC
  Administered 2015-03-07 – 2015-03-08 (×2): via INTRAVENOUS
  Filled 2015-03-07 (×3): qty 1000

## 2015-03-07 MED ORDER — DIPHENHYDRAMINE HCL 12.5 MG/5ML PO ELIX: 12.5000 mg | ORAL_SOLUTION | Freq: Four times a day (QID) | ORAL | Status: DC | PRN

## 2015-03-07 MED ORDER — PROPOFOL 10 MG/ML IV BOLUS
INTRAVENOUS | Status: DC | PRN
  Administered 2015-03-07: 150 mg via INTRAVENOUS

## 2015-03-07 MED ORDER — MIDAZOLAM HCL 5 MG/5ML IJ SOLN
INTRAMUSCULAR | Status: DC | PRN
  Administered 2015-03-07: 1 mg via INTRAVENOUS

## 2015-03-07 MED ORDER — BUPIVACAINE-EPINEPHRINE 0.25% -1:200000 IJ SOLN
INTRAMUSCULAR | Status: DC | PRN
  Administered 2015-03-07: 4 mL

## 2015-03-07 MED ORDER — FENTANYL CITRATE (PF) 250 MCG/5ML IJ SOLN
INTRAMUSCULAR | Status: AC
  Filled 2015-03-07: qty 5

## 2015-03-07 MED ORDER — HYDROMORPHONE HCL 1 MG/ML IJ SOLN
0.2500 mg | INTRAMUSCULAR | Status: DC | PRN
  Administered 2015-03-07 (×4): 0.5 mg via INTRAVENOUS

## 2015-03-07 MED ORDER — BUPIVACAINE-EPINEPHRINE (PF) 0.25% -1:200000 IJ SOLN
INTRAMUSCULAR | Status: AC
  Filled 2015-03-07: qty 30

## 2015-03-07 MED ORDER — BISACODYL 5 MG PO TBEC
5.0000 mg | DELAYED_RELEASE_TABLET | Freq: Every day | ORAL | Status: DC | PRN
  Administered 2015-03-10 – 2015-03-12 (×2): 5 mg via ORAL
  Filled 2015-03-07 (×2): qty 1

## 2015-03-07 MED ORDER — LACTATED RINGERS IV SOLN: INTRAVENOUS | Status: DC

## 2015-03-07 MED ORDER — MORPHINE SULFATE (PF) 2 MG/ML IV SOLN
2.0000 mg | INTRAVENOUS | Status: DC | PRN
  Administered 2015-03-07: 2 mg via INTRAVENOUS
  Administered 2015-03-11: 4 mg via INTRAVENOUS
  Filled 2015-03-07 (×2): qty 1
  Filled 2015-03-07: qty 2

## 2015-03-07 MED ORDER — IRBESARTAN 150 MG PO TABS
150.0000 mg | ORAL_TABLET | Freq: Every day | ORAL | Status: DC
  Administered 2015-03-08 – 2015-03-13 (×6): 150 mg via ORAL
  Filled 2015-03-07 (×7): qty 1

## 2015-03-07 MED ORDER — VALSARTAN-HYDROCHLOROTHIAZIDE 160-25 MG PO TABS: 1.0000 | ORAL_TABLET | Freq: Every day | ORAL | Status: DC

## 2015-03-07 MED ORDER — ONDANSETRON 4 MG PO TBDP: 4.0000 mg | ORAL_TABLET | Freq: Four times a day (QID) | ORAL | Status: DC | PRN

## 2015-03-07 MED ORDER — LIDOCAINE HCL (CARDIAC) 20 MG/ML IV SOLN
INTRAVENOUS | Status: DC | PRN
  Administered 2015-03-07: 80 mg via INTRAVENOUS

## 2015-03-07 MED ORDER — OXYCODONE HCL 5 MG PO TABS
5.0000 mg | ORAL_TABLET | ORAL | Status: DC | PRN
  Administered 2015-03-07 – 2015-03-12 (×15): 10 mg via ORAL
  Administered 2015-03-13: 5 mg via ORAL
  Filled 2015-03-07: qty 2
  Filled 2015-03-07: qty 1
  Filled 2015-03-07 (×14): qty 2

## 2015-03-07 MED ORDER — CEFAZOLIN SODIUM-DEXTROSE 2-3 GM-% IV SOLR
2.0000 g | Freq: Three times a day (TID) | INTRAVENOUS | Status: AC
  Administered 2015-03-07: 2 g via INTRAVENOUS
  Filled 2015-03-07 (×2): qty 50

## 2015-03-07 MED ORDER — METHOCARBAMOL 500 MG PO TABS
500.0000 mg | ORAL_TABLET | Freq: Four times a day (QID) | ORAL | Status: DC | PRN
  Administered 2015-03-10 – 2015-03-12 (×3): 500 mg via ORAL
  Filled 2015-03-07 (×3): qty 1

## 2015-03-07 MED ORDER — HYDROMORPHONE HCL 1 MG/ML IJ SOLN
INTRAMUSCULAR | Status: AC
  Filled 2015-03-07: qty 1

## 2015-03-07 MED ORDER — HYDROCHLOROTHIAZIDE 25 MG PO TABS
25.0000 mg | ORAL_TABLET | Freq: Every day | ORAL | Status: DC
  Administered 2015-03-08 – 2015-03-13 (×6): 25 mg via ORAL
  Filled 2015-03-07 (×7): qty 1

## 2015-03-07 MED ORDER — FERROUS SULFATE 325 (65 FE) MG PO TABS
325.0000 mg | ORAL_TABLET | Freq: Three times a day (TID) | ORAL | Status: DC
  Administered 2015-03-08 – 2015-03-13 (×18): 325 mg via ORAL
  Filled 2015-03-07 (×18): qty 1

## 2015-03-07 MED ORDER — ONDANSETRON HCL 4 MG/2ML IJ SOLN
INTRAMUSCULAR | Status: AC
  Filled 2015-03-07: qty 2

## 2015-03-07 MED ORDER — ROCURONIUM BROMIDE 50 MG/5ML IV SOLN
INTRAVENOUS | Status: AC
  Filled 2015-03-07: qty 2

## 2015-03-07 MED ORDER — ENOXAPARIN SODIUM 40 MG/0.4ML ~~LOC~~ SOLN
40.0000 mg | SUBCUTANEOUS | Status: DC
  Administered 2015-03-08 – 2015-03-13 (×6): 40 mg via SUBCUTANEOUS
  Filled 2015-03-07 (×6): qty 0.4

## 2015-03-07 MED ORDER — ROCURONIUM BROMIDE 100 MG/10ML IV SOLN
INTRAVENOUS | Status: DC | PRN
Start: 2015-03-07 — End: 2015-03-07
  Administered 2015-03-07 (×4): 10 mg via INTRAVENOUS
  Administered 2015-03-07: 50 mg via INTRAVENOUS
  Administered 2015-03-07: 10 mg via INTRAVENOUS

## 2015-03-07 MED ORDER — ALBUTEROL SULFATE (2.5 MG/3ML) 0.083% IN NEBU: 2.5000 mg | INHALATION_SOLUTION | Freq: Four times a day (QID) | RESPIRATORY_TRACT | Status: DC | PRN

## 2015-03-07 MED ORDER — FENTANYL CITRATE (PF) 100 MCG/2ML IJ SOLN
INTRAMUSCULAR | Status: DC | PRN
Start: 2015-03-07 — End: 2015-03-07
  Administered 2015-03-07: 50 ug via INTRAVENOUS
  Administered 2015-03-07: 100 ug via INTRAVENOUS
  Administered 2015-03-07 (×3): 50 ug via INTRAVENOUS

## 2015-03-07 MED ORDER — PROPOFOL 10 MG/ML IV BOLUS
INTRAVENOUS | Status: AC
  Filled 2015-03-07: qty 20

## 2015-03-07 MED ORDER — ACETAMINOPHEN 650 MG RE SUPP: 650.0000 mg | Freq: Four times a day (QID) | RECTAL | Status: DC | PRN

## 2015-03-07 MED ORDER — ONDANSETRON HCL 4 MG/2ML IJ SOLN
4.0000 mg | Freq: Four times a day (QID) | INTRAMUSCULAR | Status: DC | PRN
Start: 2015-03-07 — End: 2015-03-14
  Administered 2015-03-07 (×2): 4 mg via INTRAVENOUS
  Filled 2015-03-07: qty 2

## 2015-03-07 MED ORDER — PHENYLEPHRINE HCL 10 MG/ML IJ SOLN
INTRAMUSCULAR | Status: DC | PRN
  Administered 2015-03-07 (×6): 40 ug via INTRAVENOUS

## 2015-03-07 MED ORDER — DIPHENHYDRAMINE HCL 50 MG/ML IJ SOLN: 12.5000 mg | Freq: Four times a day (QID) | INTRAMUSCULAR | Status: DC | PRN

## 2015-03-07 MED ORDER — SUGAMMADEX SODIUM 200 MG/2ML IV SOLN
INTRAVENOUS | Status: DC | PRN
  Administered 2015-03-07: 200 mg via INTRAVENOUS

## 2015-03-07 MED ORDER — PHENYLEPHRINE 40 MCG/ML (10ML) SYRINGE FOR IV PUSH (FOR BLOOD PRESSURE SUPPORT)
PREFILLED_SYRINGE | INTRAVENOUS | Status: AC
  Filled 2015-03-07: qty 10

## 2015-03-07 MED ORDER — SODIUM CHLORIDE 0.9 % IR SOLN
Status: DC | PRN
  Administered 2015-03-07: 1000 mL

## 2015-03-07 MED ORDER — MONTELUKAST SODIUM 10 MG PO TABS
10.0000 mg | ORAL_TABLET | Freq: Every day | ORAL | Status: DC
  Administered 2015-03-07 – 2015-03-13 (×7): 10 mg via ORAL
  Filled 2015-03-07 (×7): qty 1

## 2015-03-07 MED ORDER — ONDANSETRON HCL 4 MG/2ML IJ SOLN
INTRAMUSCULAR | Status: DC | PRN
  Administered 2015-03-07: 4 mg via INTRAVENOUS

## 2015-03-07 MED ORDER — ACETAMINOPHEN 325 MG PO TABS: 650.0000 mg | ORAL_TABLET | Freq: Four times a day (QID) | ORAL | Status: DC | PRN

## 2015-03-07 MED ORDER — ZOLPIDEM TARTRATE 5 MG PO TABS: 5.0000 mg | ORAL_TABLET | Freq: Every evening | ORAL | Status: DC | PRN

## 2015-03-07 MED ORDER — ALBUTEROL SULFATE HFA 108 (90 BASE) MCG/ACT IN AERS: 2.0000 | INHALATION_SPRAY | Freq: Four times a day (QID) | RESPIRATORY_TRACT | Status: DC | PRN

## 2015-03-07 MED ORDER — LORATADINE 10 MG PO TABS
10.0000 mg | ORAL_TABLET | Freq: Every day | ORAL | Status: DC
  Administered 2015-03-07 – 2015-03-13 (×7): 10 mg via ORAL
  Filled 2015-03-07 (×7): qty 1

## 2015-03-07 MED ORDER — LIDOCAINE HCL (CARDIAC) 20 MG/ML IV SOLN
INTRAVENOUS | Status: AC
  Filled 2015-03-07: qty 5

## 2015-03-07 MED ORDER — LACTATED RINGERS IV SOLN
INTRAVENOUS | Status: DC
  Administered 2015-03-07 (×2): via INTRAVENOUS

## 2015-03-07 MED ORDER — PHENYLEPHRINE HCL 10 MG/ML IJ SOLN
10.0000 mg | INTRAVENOUS | Status: DC | PRN
  Administered 2015-03-07: 15 ug/min via INTRAVENOUS

## 2015-03-07 MED ORDER — MIDAZOLAM HCL 2 MG/2ML IJ SOLN
INTRAMUSCULAR | Status: AC
  Filled 2015-03-07: qty 2

## 2015-03-07 MED ORDER — PANTOPRAZOLE SODIUM 40 MG IV SOLR
40.0000 mg | Freq: Every day | INTRAVENOUS | Status: DC
  Administered 2015-03-07 – 2015-03-08 (×2): 40 mg via INTRAVENOUS
  Filled 2015-03-07 (×2): qty 40

## 2015-03-07 SURGICAL SUPPLY — 62 items
APPLIER CLIP LOGIC TI 5 (MISCELLANEOUS) IMPLANT
APPLIER CLIP ROT 10 11.4 M/L (STAPLE)
BENZOIN TINCTURE PRP APPL 2/3 (GAUZE/BANDAGES/DRESSINGS) ×3 IMPLANT
BINDER ABD UNIV 12 45-62 (WOUND CARE) IMPLANT
BINDER ABDOMINAL 46IN 62IN (WOUND CARE)
BLADE SURG ROTATE 9660 (MISCELLANEOUS) IMPLANT
CANISTER SUCTION 2500CC (MISCELLANEOUS) IMPLANT
CHLORAPREP W/TINT 26ML (MISCELLANEOUS) ×6 IMPLANT
CLIP APPLIE ROT 10 11.4 M/L (STAPLE) IMPLANT
CLOSURE WOUND 1/2 X4 (GAUZE/BANDAGES/DRESSINGS) ×1
COVER SURGICAL LIGHT HANDLE (MISCELLANEOUS) ×3 IMPLANT
DECANTER SPIKE VIAL GLASS SM (MISCELLANEOUS) IMPLANT
DERMABOND ADVANCED (GAUZE/BANDAGES/DRESSINGS) ×2
DERMABOND ADVANCED .7 DNX12 (GAUZE/BANDAGES/DRESSINGS) ×1 IMPLANT
DEVICE RELIATACK FIXATION (MISCELLANEOUS) ×3 IMPLANT
DEVICE SECURE STRAP 25 ABSORB (INSTRUMENTS) ×3 IMPLANT
DEVICE TROCAR PUNCTURE CLOSURE (ENDOMECHANICALS) ×3 IMPLANT
DRAPE LAPAROSCOPIC ABDOMINAL (DRAPES) ×3 IMPLANT
DRSG TEGADERM 2-3/8X2-3/4 SM (GAUZE/BANDAGES/DRESSINGS) ×3 IMPLANT
ELECT REM PT RETURN 9FT ADLT (ELECTROSURGICAL) ×3
ELECTRODE REM PT RTRN 9FT ADLT (ELECTROSURGICAL) ×1 IMPLANT
FILTER SMOKE EVAC LAPAROSHD (FILTER) ×3 IMPLANT
GAUZE SPONGE 2X2 8PLY STRL LF (GAUZE/BANDAGES/DRESSINGS) ×1 IMPLANT
GLOVE BIO SURGEON STRL SZ7 (GLOVE) ×6 IMPLANT
GLOVE BIO SURGEON STRL SZ7.5 (GLOVE) ×3 IMPLANT
GLOVE BIOGEL PI IND STRL 6.5 (GLOVE) ×1 IMPLANT
GLOVE BIOGEL PI IND STRL 7.0 (GLOVE) ×1 IMPLANT
GLOVE BIOGEL PI IND STRL 7.5 (GLOVE) ×2 IMPLANT
GLOVE BIOGEL PI INDICATOR 6.5 (GLOVE) ×2
GLOVE BIOGEL PI INDICATOR 7.0 (GLOVE) ×2
GLOVE BIOGEL PI INDICATOR 7.5 (GLOVE) ×4
GLOVE SURG SS PI 6.5 STRL IVOR (GLOVE) ×6 IMPLANT
GOWN STRL REUS W/ TWL LRG LVL3 (GOWN DISPOSABLE) ×3 IMPLANT
GOWN STRL REUS W/TWL LRG LVL3 (GOWN DISPOSABLE) ×6
KIT BASIN OR (CUSTOM PROCEDURE TRAY) ×3 IMPLANT
KIT ROOM TURNOVER OR (KITS) ×3 IMPLANT
MARKER SKIN DUAL TIP RULER LAB (MISCELLANEOUS) ×3 IMPLANT
MESH VENTRALIGHT ST 8X10 (Mesh General) ×3 IMPLANT
NEEDLE SPNL 22GX3.5 QUINCKE BK (NEEDLE) ×3 IMPLANT
NS IRRIG 1000ML POUR BTL (IV SOLUTION) ×3 IMPLANT
PAD ARMBOARD 7.5X6 YLW CONV (MISCELLANEOUS) ×6 IMPLANT
PENCIL BUTTON HOLSTER BLD 10FT (ELECTRODE) ×3 IMPLANT
RELOAD RELIATACK 10 (MISCELLANEOUS) ×6 IMPLANT
RELOAD RELIATACK 5 (MISCELLANEOUS) ×3 IMPLANT
SCALPEL HARMONIC ACE (MISCELLANEOUS) ×3 IMPLANT
SCISSORS LAP 5X35 DISP (ENDOMECHANICALS) ×3 IMPLANT
SET IRRIG TUBING LAPAROSCOPIC (IRRIGATION / IRRIGATOR) IMPLANT
SLEEVE ENDOPATH XCEL 5M (ENDOMECHANICALS) ×3 IMPLANT
SPONGE GAUZE 2X2 STER 10/PKG (GAUZE/BANDAGES/DRESSINGS) ×2
STRIP CLOSURE SKIN 1/2X4 (GAUZE/BANDAGES/DRESSINGS) ×2 IMPLANT
SUT MNCRL AB 4-0 PS2 18 (SUTURE) ×3 IMPLANT
SUT NOVA NAB GS-21 0 18 T12 DT (SUTURE) ×6 IMPLANT
SUT VICRYL 0 UR6 27IN ABS (SUTURE) ×3 IMPLANT
TOWEL OR 17X24 6PK STRL BLUE (TOWEL DISPOSABLE) ×3 IMPLANT
TOWEL OR 17X26 10 PK STRL BLUE (TOWEL DISPOSABLE) ×3 IMPLANT
TRAY FOLEY CATH 14FRSI W/METER (CATHETERS) ×3 IMPLANT
TRAY LAPAROSCOPIC MC (CUSTOM PROCEDURE TRAY) ×3 IMPLANT
TROCAR ADV FIXATION 5X100MM (TROCAR) ×3 IMPLANT
TROCAR XCEL BLUNT TIP 100MML (ENDOMECHANICALS) IMPLANT
TROCAR XCEL NON-BLD 11X100MML (ENDOMECHANICALS) ×6 IMPLANT
TROCAR XCEL NON-BLD 5MMX100MML (ENDOMECHANICALS) ×3 IMPLANT
TUBING INSUFFLATION (TUBING) ×3 IMPLANT

## 2015-03-07 NOTE — Progress Notes (Signed)
Orthopedic Tech Progress Note Patient Details:  [name] September 21, 1950 [id number] Delivered abdominal binder to pt.'s nurse. Patient ID: [name], female   DOB: 05-Jul-1950, 65 y.o.   MRN: [id number]   Darrol Poke 03/07/2015, 1:56 PM

## 2015-03-07 NOTE — Anesthesia Procedure Notes (Signed)
Procedure Name: Intubation Date/Time: 03/07/2015 9:50 AM Performed by: Tressia Miners LEFFEW Pre-anesthesia Checklist: Patient identified, Patient being monitored, Timeout performed, Emergency Drugs available and Suction available Patient Re-evaluated:Patient Re-evaluated prior to inductionOxygen Delivery Method: Circle System Utilized Preoxygenation: Pre-oxygenation with 100% oxygen Intubation Type: IV induction Ventilation: Mask ventilation without difficulty Laryngoscope Size: Mac and 4 Grade View: Grade I Tube type: Oral Tube size: 7.0 mm Number of attempts: 1 Airway Equipment and Method: Stylet Placement Confirmation: ETT inserted through vocal cords under direct vision,  positive ETCO2 and breath sounds checked- equal and bilateral Secured at: 21 cm Tube secured with: Tape Dental Injury: Teeth and Oropharynx as per pre-operative assessment

## 2015-03-07 NOTE — H&P (View-Only) (Signed)
History of Present Illness [name]. Melissa Wedig MD; 02/14/2015 5:09 PM) Patient words: abd pain.  The patient is a 65 year old female who presents with an incisional hernia. This is a 65 year old female who initially presented in January 2015 with abdominal pain, nausea, and vomiting. A CT scan showed a mass at the splenic flexure. Sigmoidoscopy showed a large friable fungating tumor obstructing the splenic flexure. Biopsy revealed adenocarcinoma. I performed a partial colectomy with a temporary proximal transverse colostomy in the right upper quadrant. She had a long Hartman's pouch at the distal descending colon. She had a prolonged hospital course and required temporary placement in a skilled nursing facility. She completed a course of chemotherapy (Xeloda). She had a colonoscopy last fall which revealed no other findings in the proximal colon. On November 25, 2013, she underwent colostomy reversal. Again she had a prolonged hospital stay because of her medical comorbidities. She seems to be improving. In January 2016, she underwent a follow-up CT scan of the chest, abdomen, and pelvis. 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 She also has ventral hernias in the upper midline as well as in the right-sided ostomy site. The upper midline hernia is causing some discomfort. On 04/06/14, she underwent small bowel resection with primary repair of two ventral incisional hernias with Novafil sutures. 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 Over the last several months, she has developed enlargement of 2 bulges in the epigastrium. Occasionally is close fairly severe pain. She was seen in the emergency department last week where a CT scan revealed 2 ventral incisional hernias containing small bowel. There was no sign of obstruction. She presents now  to discuss hernia repair.   She last saw [name] in December and is stable from a colon cancer standpoint.  CLINICAL DATA: Acute onset of severe mid abdominal pain, bloating, gas and nausea. Initial encounter.  EXAM: CT ABDOMEN AND PELVIS WITH CONTRAST  TECHNIQUE: Multidetector CT imaging of the abdomen and pelvis was performed using the standard protocol following bolus administration of intravenous contrast.  CONTRAST: 23mL OMNIPAQUE IOHEXOL 300 MG/ML SOLN  COMPARISON: CT of the abdomen and pelvis performed 03/29/2014  FINDINGS: Minimal bibasilar atelectasis is noted.  There is dilatation of small bowel loops up to 3.8 cm in maximal diameter, with small bowel wall thickening and soft tissue inflammation noted about two small anterior abdominal wall hernias. There is narrowing at the entry into the right-sided hernia, and at the exit from the left-sided hernia, with dilatation of the short bowel segment between the two hernias.  However, contrast does extend through the dilated loops into the relatively decompressed small bowel, and minimally into the cecum. Findings are compatible with dysmotility and inflammation secondary to the anterior abdominal wall hernias.  A small to moderate right lower quadrant anterior abdominal wall hernia is also seen, containing a short segment of distal ileum, without evidence of obstruction.  A dilated small bowel anastomosis is noted at the left mid abdomen, likely within normal limits.  The liver and spleen are unremarkable in appearance. The patient is status post cholecystectomy, with clips noted along the gallbladder fossa. The pancreas and adrenal glands are unremarkable.  The kidneys are unremarkable in appearance. There is no evidence of hydronephrosis. No renal or ureteral stones are seen. No perinephric stranding is appreciated.  The stomach is within normal limits. No acute vascular abnormalities are  seen.  The patient is status post appendectomy. The patient is status post partial colectomy. The remaining colon is grossly unremarkable.  The bladder is mildly distended and grossly unremarkable. The patient is status post hysterectomy. No suspicious adnexal masses are seen. The right ovary is unremarkable in appearance. No inguinal lymphadenopathy is seen.  No acute osseous abnormalities are identified. There is grade 1 anterolisthesis of L4 on L5, reflecting underlying facet disease.  IMPRESSION: 1. Dilatation of small bowel loops up to 3.8 cm in maximal diameter, with small bowel wall thickening and soft tissue inflammation about two small anterior abdominal wall hernias. Narrowing at the entry into the right-sided hernia, and at the exit from the left-sided hernia, with dilatation of the small bowel segment between the two hernias. Contrast does extend into the more distal relatively decompressed small bowel, and minimally to the cecum. Findings compatible with dysmotility and inflammation secondary to the anterior abdominal wall hernias. 2. An additional small to moderate right lower quadrant anterior abdominal wall hernia contains a short segment of distal ileum, without evidence for obstruction. 3. Dilated small bowel anastomosis at the left mid abdomen remains within normal limits. These results were called by telephone at the time of interpretation on 02/04/2015 at 1:01 am to Dr. [name], who verbally acknowledged these results.   Electronically Signed By: Garald Balding M.D. On: 02/04/2015 01:02    Allergies (Ammie Eversole, LPN; D34-534 624THL PM) No Known Drug Allergies 12/31/2013  Medication History (Ammie Eversole, LPN; D34-534 075-GRM PM) Advair Diskus (250-50MCG/DOSE Aero Pow Br Act, Inhalation as needed) Active. Fluconazole (200MG  Tablet, Oral) Active. Furosemide (20MG  Tablet, Oral) Active. Montelukast Sodium (10MG  Tablet, Oral)  Active. Omeprazole (20MG  Capsule DR, Oral) Active. Potassium Chloride ER (10MEQ Capsule ER, Oral) Active. Naproxen (500MG  Tablet, Oral) Active. ProAir HFA (108 (90 Base)MCG/ACT Aerosol Soln, Inhalation) Active. Proventil ((2.5 MG/3ML)0.083% Nebulized Soln, Inhalation) Active. Aspirin (81MG  Tablet Chewable, Oral) Active. Vitamin D (Cholecalciferol) (1000UNIT Capsule, Oral) Active. Vitamin B-12 (1000MCG Tablet, Oral) Active. Ferrous Sulfate (325 (65 Fe)MG Tablet, Oral) Active. RaNITidine HCl (150MG  Tablet, Oral) Active. Medications Reconciled    Vitals (Ammie Eversole LPN; D34-534 624THL PM) 02/14/2015 3:34 PM Weight: 225.8 lb Height: 62in Body Surface Area: 2.01 m Body Mass Index: 41.3 kg/m  Temp.: 97.88F(Oral)  Pulse: 74 (Regular)  BP: 132/88 (Sitting, Left Arm, Standard)      Physical Exam Rodman Key K. Nargis Abrams MD; 02/14/2015 5:09 PM)  The physical exam findings are as follows: Note:WDWN in NAD HEENT: EOMI, sclera anicteric Neck: No masses, no thyromegaly Lungs: CTA bilaterally; normal respiratory effort CV: Regular rate and rhythm; no murmurs Abd: +bowel sounds, soft, incisions well-healed; tender in epigastrium - reducible hernias when supine Ext: Well-perfused; no edema Skin: Warm, dry; no sign of jaundice    Assessment & Plan Rodman Key K. 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 Schedule for Surgery - Laparoscopic ventral hernia repair with mesh. The surgical procedure has been discussed with the patient. Potential risks, benefits, alternative treatments, and expected outcomes have been explained. All of the patient's questions at this time have been answered. The likelihood of reaching the patient's treatment goal is good. The patient understand the proposed surgical procedure and  wishes to proceed.   [name]. Georgette Dover, MD, [facility] Surgery  General/ Trauma Surgery  02/14/2015 5:10 PM

## 2015-03-07 NOTE — Transfer of Care (Signed)
Immediate Anesthesia Transfer of Care Note  Patient: [name]  Procedure(s) Performed: Procedure(s): LAPAROSCOPIC VENTRAL HERNIA (N/A) INSERTION OF MESH (N/A)  Patient Location: PACU  Anesthesia Type:General  Level of Consciousness: awake, alert , oriented, patient cooperative and responds to stimulation  Airway & Oxygen Therapy: Patient Spontanous Breathing and Patient connected to nasal cannula oxygen  Post-op Assessment: Report given to RN, Post -op Vital signs reviewed and stable and Patient moving all extremities X 4  Post vital signs: Reviewed and stable  Last Vitals:  Filed Vitals:   03/07/15 0806 03/07/15 1321  BP: 152/78 135/90  Pulse: 65 68  Temp: 36.5 C   Resp: 20 16    Complications: No apparent anesthesia complications

## 2015-03-07 NOTE — Anesthesia Postprocedure Evaluation (Signed)
Anesthesia Post Note  Patient: [name]  Procedure(s) Performed: Procedure(s) (LRB): LAPAROSCOPIC VENTRAL HERNIA (N/A) INSERTION OF MESH (N/A)  Patient location during evaluation: PACU Anesthesia Type: General Level of consciousness: awake and alert Pain management: pain level controlled Vital Signs Assessment: post-procedure vital signs reviewed and stable Respiratory status: spontaneous breathing, nonlabored ventilation, respiratory function stable and patient connected to nasal cannula oxygen Cardiovascular status: blood pressure returned to baseline and stable Postop Assessment: no signs of nausea or vomiting Anesthetic complications: no    Last Vitals:  Filed Vitals:   03/07/15 1427 03/07/15 1458  BP: 122/96 148/76  Pulse: 51 56  Temp: 36.5 C 36.4 C  Resp: 16 16    Last Pain:  Filed Vitals:   03/07/15 1459  PainSc: Asleep                 Darrell Leonhardt L

## 2015-03-07 NOTE — Op Note (Addendum)
Laparoscopic Lysis of Adhesion (Extensive - 90 minutes) / Ventral Hernia Repair Procedure Note  Indications: Symptomatic ventral hernias  Pre-operative Diagnosis: Recurrent ventral hernia x 3  Post-operative Diagnosis: Recurrent ventral hernia x 3 with extensive intra-abdominal adhesions  Surgeon: Kaveh Kissinger K.   Assistants: none  Anesthesia: General endotracheal anesthesia  ASA Class: 3  Procedure Details  The patient was seen in the Holding Room. The risks, benefits, complications, treatment options, and expected outcomes were discussed with the patient. The possibilities of reaction to medication, pulmonary aspiration, perforation of viscus, bleeding, recurrent infection, the need for additional procedures, failure to diagnose a condition, and creating a complication requiring transfusion or operation were discussed with the patient. The patient concurred with the proposed plan, giving informed consent.  The site of surgery properly noted/marked. The patient was taken to the operating room, identified as [name] and the procedure verified as laparoscopic ventral hernia repair with mesh. A Time Out was held and the above information confirmed.    The patient was placed supine.  After establishing general anesthesia, a Foley catheter was placed.  The abdomen was prepped with Chloraprep and draped in standard fashion.  A 5 mm Optiview was used the cannulate the peritoneal cavity in the left upper quadrant below the costal margin.  Pneumoperitoneum was obtained by insufflating CO2, maintaining a maximum pressure of 15 mmHg.  The 5 mm 30-degree laparoscope was inserted.  There were significant small bowel adhesions to the anterior abdominal wall.  An 11-mm port was placed in the left anterior axillary line at the level of the umbilicus.  Another 5-mm port was placed in the left lower quadrant.  Cold scissors and gentle traction were used to dissect the adhesions away from the anterior  abdominal wall.  We were able to reduce the small bowel out of all three hernia defects We cleared the entire abdominal wall and were able to visualize three fascial defects. There is a 3 cm defect in the RLQ at the old ileostomy site.  In the upper abdomen to the right of midline, there is a 3 cm defect containing small bowel.  Just inferior to this, there is a 2 cm defect containing some small bowel.  We used a spinal needle to identify the extent of the hernia defects.  This covered an area of about 16 x 20 cms.  We selected a 20 x 25 piece of Bard Ventralight mesh.  We placed eight stay sutures of 0 Novofil around the edges of the mesh.  The mesh was then rolled up and inserted through the 11 mm port site.  The mesh was then unrolled.  The stay sutures were then pulled up through small stab incisions using the Endo-close device.  This deployed the mesh widely over the fascial defects.  The stay sutures were then tied down.  The Reliatack device was then used to tack down the edges of the mesh at 1 cm intervals circumferentially. We placed two additional 5 mm ports on the right to allow proper placement of the tacks. We placed a few tacks inside the outer ring of tacks.  We inspected for hemostasis.  The fascial defect at the 11 mm port site was closed with a 0 Vicryl.  Pneumoperitoneum was then released as we removed the remainder of the trocars.  The port sites were closed with 4-0 Monocryl.  All of the incisions and stay suture sites were then sealed with Dermabond.  An abdominal binder was placed around the  patient's abdomen.  The patient was extubated and brought to the recovery room in stable condition.  All sponge, instrument, and needle counts were correct prior to closure and at the conclusion of the case.   Findings: Three abdominal wall defects - RLQ, R upper midline x 2  Estimated Blood Loss:  less than 50 mL         Complications:  None; patient tolerated the procedure well.          Disposition: PACU - hemodynamically stable.         Condition: stable  Imogene Burn. Georgette Dover, MD, [facility] Surgery  General/ Trauma Surgery  03/07/2015 1:11 PM

## 2015-03-07 NOTE — Consult Note (Signed)
[place] called floor to respond to pt request for Bible - which they had on floor.  If [place] still required or requested by pt; please initiate additional spiritual care consult.  Gwynn Burly 5:20 PM

## 2015-03-07 NOTE — Anesthesia Preprocedure Evaluation (Signed)
Anesthesia Evaluation  Patient identified by MRN, date of birth, ID band Patient awake    Reviewed: Allergy & Precautions, H&P , NPO status , Patient's Chart, lab work & pertinent test results  Airway Mallampati: III  TM Distance: <3 FB Neck ROM: Full    Dental no notable dental hx. (+) Dental Advisory Given, Teeth Intact   Pulmonary shortness of breath and with exertion, asthma , sleep apnea and Continuous Positive Airway Pressure Ventilation , COPD,  COPD inhaler,    Pulmonary exam normal breath sounds clear to auscultation       Cardiovascular hypertension, Pt. on medications Normal cardiovascular exam Rhythm:Regular Rate:Normal     Neuro/Psych Cervical strain negative neurological ROS  negative psych ROS   GI/Hepatic negative GI ROS, Neg liver ROS, GERD  Medicated and Controlled,  Endo/Other  Morbid obesity  Renal/GU negative Renal ROS  negative genitourinary   Musculoskeletal negative musculoskeletal ROS (+)   Abdominal (+) + obese,   Peds negative pediatric ROS (+)  Hematology negative hematology ROS (+) anemia , Pernicious anemia   Anesthesia Other Findings   Reproductive/Obstetrics negative OB ROS                             Anesthesia Physical Anesthesia Plan  ASA: III  Anesthesia Plan: General   Post-op Pain Management:    Induction: Intravenous  Airway Management Planned: Oral ETT  Additional Equipment:   Intra-op Plan:   Post-operative Plan: Extubation in OR  Informed Consent: I have reviewed the patients History and Physical, chart, labs and discussed the procedure including the risks, benefits and alternatives for the proposed anesthesia with the patient or authorized representative who has indicated his/her understanding and acceptance.   Dental Advisory Given  Plan Discussed with: CRNA and Surgeon  Anesthesia Plan Comments:         Anesthesia Quick  Evaluation

## 2015-03-07 NOTE — Care Management Note (Signed)
Case Management Note  Patient Details  Name: [name] MRN: [id number] Date of Birth: [birth year]  Subjective/Objective:                    Action/Plan:  Initial UR completed  Expected Discharge Date:                  Expected Discharge Plan:  Home/Self Care  In-House Referral:     Discharge planning Services     Post Acute Care Choice:    Choice offered to:     DME Arranged:    DME Agency:     HH Arranged:    [place] Agency:     Status of Service:  In process, will continue to follow  Medicare Important Message Given:    Date Medicare IM Given:    Medicare IM give by:    Date Additional Medicare IM Given:    Additional Medicare Important Message give by:     If discussed at [place] of Stay Meetings, dates discussed:    Additional Comments:  Marilu Favre, RN 03/07/2015, 2:49 PM

## 2015-03-07 NOTE — Interval H&P Note (Signed)
History and Physical Interval Note:  03/07/2015 7:59 AM  [name]  has presented today for surgery, with the diagnosis of Recurrent ventral hernia  The various methods of treatment have been discussed with the patient and family. After consideration of risks, benefits and other options for treatment, the patient has consented to  Procedure(s): LAPAROSCOPIC VENTRAL HERNIA (N/A) INSERTION OF MESH (N/A) as a surgical intervention .  The patient's history has been reviewed, patient examined, no change in status, stable for surgery.  I have reviewed the patient's chart and labs.  Questions were answered to the patient's satisfaction.     [name] K.

## 2015-03-08 ENCOUNTER — Encounter (HOSPITAL_COMMUNITY): Payer: Self-pay | Admitting: Surgery

## 2015-03-08 LAB — BASIC METABOLIC PANEL
ANION GAP: 6 (ref 5–15)
BUN: 6 mg/dL (ref 6–20)
CHLORIDE: 108 mmol/L (ref 101–111)
CO2: 29 mmol/L (ref 22–32)
Calcium: 8.6 mg/dL — ABNORMAL LOW (ref 8.9–10.3)
Creatinine, Ser: 0.82 mg/dL (ref 0.44–1.00)
GFR calc Af Amer: >60 mL/min (ref >60)
GFR calc non Af Amer: >60 mL/min (ref >60)
Glucose, Bld: 106 mg/dL — ABNORMAL HIGH (ref 65–99)
POTASSIUM: 4 mmol/L (ref 3.5–5.1)
SODIUM: 143 mmol/L (ref 135–145)

## 2015-03-08 LAB — CBC
HCT: 35 % — ABNORMAL LOW (ref 36.0–46.0)
Hemoglobin: 11.3 g/dL — ABNORMAL LOW (ref 12.0–15.0)
MCH: 24.3 pg — AB (ref 26.0–34.0)
MCHC: 32.3 g/dL (ref 30.0–36.0)
MCV: 75.3 fL — AB (ref 78.0–100.0)
Platelets: 194 10*3/uL (ref 150–400)
RBC: 4.65 MIL/uL (ref 3.87–5.11)
RDW: 16 % — ABNORMAL HIGH (ref 11.5–15.5)
WBC: 6.9 10*3/uL (ref 4.0–10.5)

## 2015-03-08 MED ORDER — KETOROLAC TROMETHAMINE 15 MG/ML IJ SOLN
15.0000 mg | Freq: Four times a day (QID) | INTRAMUSCULAR | Status: DC
Start: 2015-03-08 — End: 2015-03-13
  Administered 2015-03-08 – 2015-03-12 (×18): 15 mg via INTRAVENOUS
  Filled 2015-03-08 (×18): qty 1

## 2015-03-08 NOTE — Evaluation (Signed)
Physical Therapy Evaluation Patient Details Name: [name] MRN: [id number] DOB: [birth year] Today's Date: 03/08/2015   History of Present Illness  Patient is a 65 y/o female admitted with Recurrent ventral hernia x 3 with extensive intra-abdominal adhesions. now s/p exp lap with removal of adhesions and ventral hernia repair.  Clinical Impression  Patient presents with decreased independence with mobility and will benefit from skilled PT in the acute setting to allow return home with family support and HHPT.    Follow Up Recommendations Home health PT    Equipment Recommendations  None recommended by PT    Recommendations for Other Services       Precautions / Restrictions Precautions Precautions: Fall Required Braces or Orthoses: Other Brace/Splint Other Brace/Splint: abdominal binder      Mobility  Bed Mobility Overal bed mobility: Needs Assistance Bed Mobility: Rolling;Sidelying to Sit;Sit to Supine Rolling: Min assist Sidelying to sit: Min assist   Sit to supine: Mod assist   General bed mobility comments: assist with lifting trunk upright from sidelying, increased time to move feet off bed; assist for both legs into bed  Transfers Overall transfer level: Needs assistance Equipment used: Rolling walker (2 wheeled) Transfers: Sit to/from Stand Sit to Stand: Min guard         General transfer comment: increased time and steady assist for balance  Ambulation/Gait Ambulation/Gait assistance: Min guard;Supervision Ambulation Distance (Feet): 300 Feet Assistive device: Rolling walker (2 wheeled) Gait Pattern/deviations: Step-through pattern;Decreased stride length     General Gait Details: slow and cautious, but steady with RW  Stairs            Wheelchair Mobility    Modified Rankin (Stroke Patients Only)       Balance Overall balance assessment: Needs assistance   Sitting balance-Leahy Scale: Fair Sitting balance - Comments: leaning  back due to rail at bedside and UE support   Standing balance support: No upper extremity supported Standing balance-Leahy Scale: Fair Standing balance comment: stands briefly prior to sit no UE support and no LOB                             Pertinent Vitals/Pain Pain Assessment: 0-10 Pain Score: 7  Pain Location: abdomen at incision sites Pain Descriptors / Indicators: Sore Pain Intervention(s): Limited activity within patient's tolerance;RN gave pain meds during session    Home Living Family/patient expects to be discharged to:: Private residence Living Arrangements: Spouse/significant other;Children Available Help at Discharge: Family;Available 24 hours/day Type of Home: House Home Access: Stairs to enter   [employer] of Steps: several stepping stones no steps at entry Home Layout: Multi-level Home Equipment: Environmental consultant - 2 wheels;Cane - single point;Bedside commode      Prior Function Level of Independence: Independent with assistive device(s)         Comments: Used SPC at times.      Hand Dominance        Extremity/Trunk Assessment               Lower Extremity Assessment: LLE deficits/detail;RLE deficits/detail RLE Deficits / Details: AROM grosslly WFL, strength 4/5; except hip flexion 3/5 w/pain LLE Deficits / Details: AROM grosslly WFL, strength 4/5; except hip flexion 3/5 w/pain     Communication   Communication: No difficulties  Cognition Arousal/Alertness: Awake/alert Behavior During Therapy: WFL for tasks assessed/performed Overall Cognitive Status: Within Functional Limits for tasks assessed  General Comments      Exercises        Assessment/Plan    PT Assessment Patient needs continued PT services  PT Diagnosis Difficulty walking;Acute pain   PT Problem List Decreased strength;Decreased activity tolerance;Decreased balance;Decreased mobility;Decreased knowledge of use of DME;Pain   PT Treatment Interventions DME instruction;Gait training;Stair training;Functional mobility training;Therapeutic activities;Patient/family education;Balance training;Therapeutic exercise   PT Goals (Current goals can be found in the Care Plan section) Acute Rehab PT Goals Patient Stated Goal: To get stronger and go home PT Goal Formulation: With patient Time For Goal Achievement: 03/15/15 Potential to Achieve Goals: Good    Frequency Min 3X/week   Barriers to discharge        Co-evaluation               End of Session Equipment Utilized During Treatment: Other (comment) (binder) Activity Tolerance: Patient tolerated treatment well Patient left: in bed;with call bell/phone within reach           Time: [id number] PT Time Calculation (min) (ACUTE ONLY): 32 min   Charges:   PT Evaluation $PT Eval Moderate Complexity: 1 Procedure PT Treatments $Gait Training: 8-22 mins   PT G CodesReginia Naas 2015/03/18, 3:29 PM  Magda Kiel, [place] 18-Mar-2015

## 2015-03-08 NOTE — Progress Notes (Signed)
1 Day Post-Op  Subjective: Patient with significant soreness last night No nausea Rested comfortably Foley just removed  Objective: Vital signs in last 24 hours: Temp:  [97.6 F (36.4 C)-100 F (37.8 C)] 100 F (37.8 C) (02/08 0557) Pulse Rate:  [47-87] 87 (02/08 0557) Resp:  [15-20] 18 (02/08 0557) BP: (115-152)/(66-96) 115/67 mmHg (02/08 0557) SpO2:  [97 %-100 %] 98 % (02/08 0557) Last BM Date: 03/06/15  Intake/Output from previous day: 02/07 0701 - 02/08 0700 In: 2681.3 [P.O.:240; I.V.:2441.3] Out: 975 [Urine:950; Blood:25] Intake/Output this shift:    General appearance: alert, cooperative and no distress Resp: clear to auscultation bilaterally Cardio: regular rate and rhythm, S1, S2 normal, no murmur, click, rub or gallop GI: obese, soft; minimal bowel sounds; incisional tenderness Incisions clean/ dry/ intact; minimal bruising  Lab Results:   Recent Labs  03/07/15 1530 03/08/15 0528  WBC 8.5 6.9  HGB 12.1 11.3*  HCT 38.0 35.0*  PLT 208 194   BMET  Recent Labs  03/07/15 1530  CREATININE 0.85   PT/INR No results for input(s): LABPROT, INR in the last 72 hours. ABG No results for input(s): PHART, HCO3 in the last 72 hours.  Invalid input(s): PCO2, PO2  Studies/Results: No results found.  Anti-infectives: Anti-infectives    Start     Dose/Rate Route Frequency Ordered Stop   03/07/15 1745  ceFAZolin (ANCEF) IVPB 2 g/50 mL premix     2 g 100 mL/hr over 30 Minutes Intravenous 3 times per day 03/07/15 1455 03/07/15 1913   03/07/15 0930  ceFAZolin (ANCEF) 3 g in dextrose 5 % 50 mL IVPB     3 g 130 mL/hr over 30 Minutes Intravenous To ShortStay Surgical 03/06/15 1430 03/07/15 0952      Assessment/Plan: s/p Procedure(s): LAPAROSCOPIC VENTRAL HERNIA (N/A) INSERTION OF MESH (N/A) d/c foley Clear liquids - patient likely to have some ileus because of extensive lysis of adhesions  Physical therapy - ambulate Abdominal binder Add Toradol  LOS: 1  day    Rockie Schnoor K. 03/08/2015

## 2015-03-09 MED ORDER — PANTOPRAZOLE SODIUM 40 MG PO TBEC
40.0000 mg | DELAYED_RELEASE_TABLET | Freq: Every day | ORAL | Status: DC
  Administered 2015-03-09 – 2015-03-13 (×5): 40 mg via ORAL
  Filled 2015-03-09 (×5): qty 1

## 2015-03-09 NOTE — Progress Notes (Signed)
Physical Therapy Treatment Patient Details Name: JIMENA VASSEUR MRN: [id number] DOB: 1950/06/06 Today's Date: 03/09/2015    History of Present Illness Patient is a 65 y/o female admitted with Recurrent ventral hernia x 3 with extensive intra-abdominal adhesions. now s/p exp lap with removal of adhesions and ventral hernia repair.    PT Comments    Pt performed stair training with min guard assist.  Pt performed gt with S and transfers with minguard supervision.  Pt remains slow with movement.    Follow Up Recommendations  Home health PT     Equipment Recommendations  None recommended by PT    Recommendations for Other Services       Precautions / Restrictions Precautions Precautions: Fall Required Braces or Orthoses: Other Brace/Splint Other Brace/Splint: abdominal binder Restrictions Weight Bearing Restrictions: No    Mobility  Bed Mobility Overal bed mobility: Needs Assistance Bed Mobility: Sit to Supine       Sit to supine: Min assist   General bed mobility comments: Required assist with upper trunk control to improve ease and assist with repositioning.    Transfers Overall transfer level: Needs assistance Equipment used: Rolling walker (2 wheeled) Transfers: Sit to/from Stand (from recliner and 3:1 commode.  Pt required assist with perianal care.  ) Sit to Stand: Min guard         General transfer comment: increased time and steady assist for balance, pt remains slow to ascend.  Ambulation/Gait Ambulation/Gait assistance: Min guard Ambulation Distance (Feet): 200 Feet (decreased gt distance secondary to stair trial. ) Assistive device: Rolling walker (2 wheeled) Gait Pattern/deviations: Step-through pattern;Decreased stride length;Staggering right;Staggering left     General Gait Details: slow and cautious, but steady with RW.   Stairs Stairs: Yes Stairs assistance: Min guard Stair Management: One rail Right Number of Stairs: 5 General stair  comments: Forward ascending and backwards descending.  Pt required cues for sequencing and upper trunk control.  Pt slow during negotiation and cautious for foot placement.    Wheelchair Mobility    Modified Rankin (Stroke Patients Only)       Balance                                    Cognition Arousal/Alertness: Awake/alert Behavior During Therapy: WFL for tasks assessed/performed Overall Cognitive Status: Within Functional Limits for tasks assessed                      Exercises      General Comments        Pertinent Vitals/Pain Pain Assessment: No/denies pain Pain Score: 4  Pain Descriptors / Indicators: Sore Pain Intervention(s): Limited activity within patient's tolerance    Home Living                      Prior Function            PT Goals (current goals can now be found in the care plan section) Acute Rehab PT Goals Patient Stated Goal: To get stronger and go home Potential to Achieve Goals: Good Progress towards PT goals: Progressing toward goals    Frequency  Min 3X/week    PT Plan      Co-evaluation             End of Session Equipment Utilized During Treatment: Gait belt Activity Tolerance: Patient tolerated treatment well Patient left: in bed;with  call bell/phone within reach;with bed alarm set     Time: [id number] PT Time Calculation (min) (ACUTE ONLY): 32 min  Charges:  $Gait Training: 8-22 mins $Therapeutic Activity: 8-22 mins                    G Codes:      Cristela Blue 03-16-2015, 3:28 PM Governor Rooks, PTA pager [phone]

## 2015-03-09 NOTE — Discharge Instructions (Signed)
[facility], [place] [phone]  ABDOMINAL SURGERY: POST OP INSTRUCTIONS  Always review your discharge instruction sheet given to you by the facility where your surgery was performed.  IF YOU HAVE DISABILITY OR FAMILY LEAVE FORMS, YOU MUST BRING THEM TO THE OFFICE FOR PROCESSING.  PLEASE DO NOT GIVE THEM TO YOUR DOCTOR.  1. A prescription for pain medication may be given to you upon discharge.  Take your pain medication as prescribed, if needed.  If narcotic pain medicine is not needed, then you may take acetaminophen (Tylenol) or ibuprofen (Advil) as needed. 2. Take your usually prescribed medications unless otherwise directed. 3. If you need a refill on your pain medication, please contact your pharmacy. They will contact our office to request authorization.  Prescriptions will not be filled after 5pm or on week-ends. 4. You should follow a light diet the first few days after arrival home, such as soup and crackers, pudding, etc.unless your doctor has advised otherwise. A high-fiber, low fat diet can be resumed as tolerated.   Be sure to include lots of fluids daily. Most patients will experience some swelling and bruising on the abdomen. Ice packs will help.  Swelling and bruising can take several days to resolve 5. Most patients will experience some swelling and bruising in the area of the incision. Ice pack will help. Swelling and bruising can take several days to resolve..  6. It is common to experience some constipation if taking pain medication after surgery.  Increasing fluid intake and taking a stool softener will usually help or prevent this problem from occurring.  A mild laxative (Milk of Magnesia or Miralax) should be taken according to package directions if there are no bowel movements after 48 hours. 7.  You may have steri-strips (small skin tapes) in place directly over the incisions.  These strips should be left on the skin for 7-10 days.  If your surgeon used skin  glue on the incision, you may shower in 24 hours.  The glue will flake off over the next 2-3 weeks.  Wear your abdominal binder whenever you are out of bed 8. ACTIVITIES:  You may resume regular (light) daily activities beginning the next day--such as daily self-care, walking, climbing stairs--gradually increasing activities as tolerated.  You may have sexual intercourse when it is comfortable.  Refrain from any heavy lifting or straining until approved by your doctor. a. You may drive when you no longer are taking prescription pain medication, you can comfortably wear a seatbelt, and you can safely maneuver your car and apply brakes b. Return to Work: ___________________________________ 70. You should see your doctor in the office for a follow-up appointment approximately two weeks after your surgery.  Make sure that you call for this appointment within a day or two after you arrive home to insure a convenient appointment time. OTHER INSTRUCTIONS:  _____________________________________________________________ _____________________________________________________________  WHEN TO CALL YOUR DOCTOR: 1. Fever over 101.0 2. Inability to urinate 3. Nausea and/or vomiting 4. Extreme swelling or bruising 5. Continued bleeding from incision. 6. Increased pain, redness, or drainage from the incision. 7. Difficulty swallowing or breathing 8. Muscle cramping or spasms. 9. Numbness or tingling in hands or feet or around lips.  The clinic staff is available to answer your questions during regular business hours.  Please dont hesitate to call and ask to speak to one of the nurses if you have concerns.  For further questions, please visit [email or web address]

## 2015-03-09 NOTE — Care Management Note (Signed)
Case Management Note  Patient Details  Name: DELEE REDDING MRN: [id number] Date of Birth: [birth year]  Subjective/Objective:                    Action/Plan:  Confirmed face sheet information with patient . Patient's husband is willie his cell is [phone] . Choice offered , patient would like [facility]  Phone [phone] fax [phone]. Spoke with Horris Latino at House , she accepted referral . Horris Latino aware planned discharge is for tomorrow 03-10-15 . Order , face to face and clinicals faxed to [facility] . Expected Discharge Date:                  Expected Discharge Plan:  [place]  In-House Referral:     Discharge planning Services  CM Consult  Post Acute Care Choice:  Home Health Choice offered to:     DME Arranged:    DME Agency:     HH Arranged:  PT HH Agency:  [facility]  Status of Service:  Completed, signed off  Medicare Important Message Given:    Date Medicare IM Given:    Medicare IM give by:    Date Additional Medicare IM Given:    Additional Medicare Important Message give by:     If discussed at [place] of Stay Meetings, dates discussed:    Additional Comments:  Marilu Favre, RN 03/09/2015, 11:34 AM

## 2015-03-09 NOTE — Progress Notes (Signed)
2 Days Post-Op  Subjective: Sore, but less pain than yesterday Using mostly PO pain meds plus Toradol Passing flatus No nausea Tolerating clear liquids Voiding Ambulated with PT - recommend HHPT  Objective: Vital signs in last 24 hours: Temp:  [99 F (37.2 C)-99.8 F (37.7 C)] 99.8 F (37.7 C) (02/09 0501) Pulse Rate:  [82-96] 96 (02/09 0501) Resp:  [18-19] 18 (02/09 0501) BP: (107-130)/(65-71) 115/65 mmHg (02/09 0501) SpO2:  [93 %-98 %] 95 % (02/09 0501) Last BM Date: 03/06/15  Intake/Output from previous day: 02/08 0701 - 02/09 0700 In: 920 [P.O.:920] Out: 1800 [Urine:1800] Intake/Output this shift:    General appearance: alert, cooperative and no distress GI: soft; appropriate soreness Incisions c/d/i  Lab Results:   Recent Labs  03/07/15 1530 03/08/15 0528  WBC 8.5 6.9  HGB 12.1 11.3*  HCT 38.0 35.0*  PLT 208 194   BMET  Recent Labs  03/07/15 1530 03/08/15 0528  NA  --  143  K  --  4.0  CL  --  108  CO2  --  29  GLUCOSE  --  106*  BUN  --  6  CREATININE 0.85 0.82  CALCIUM  --  8.6*   PT/INR No results for input(s): LABPROT, INR in the last 72 hours. ABG No results for input(s): PHART, HCO3 in the last 72 hours.  Invalid input(s): PCO2, PO2  Studies/Results: No results found.  Anti-infectives: Anti-infectives    Start     Dose/Rate Route Frequency Ordered Stop   03/07/15 1745  ceFAZolin (ANCEF) IVPB 2 g/50 mL premix     2 g 100 mL/hr over 30 Minutes Intravenous 3 times per day 03/07/15 1455 03/07/15 1913   03/07/15 0930  ceFAZolin (ANCEF) 3 g in dextrose 5 % 50 mL IVPB     3 g 130 mL/hr over 30 Minutes Intravenous To ShortStay Surgical 03/06/15 1430 03/07/15 0952      Assessment/Plan: s/p Procedure(s): LAPAROSCOPIC VENTRAL HERNIA (N/A) INSERTION OF MESH (N/A) Advance diet to full liquids  Encourage ambulation Arrange HHPT Saline lock Possible discharge Friday or Saturday  LOS: 2 days    Rainer Mounce K. 03/09/2015

## 2015-03-10 MED ORDER — BISACODYL 10 MG RE SUPP
10.0000 mg | Freq: Once | RECTAL | Status: DC
  Filled 2015-03-10 (×2): qty 1

## 2015-03-10 NOTE — Progress Notes (Signed)
3 Days Post-Op  Subjective: Quite sore, especially RLQ Flatus, but no BM Mild nausea last night Working with physical therapy.  Objective: Vital signs in last 24 hours: Temp:  [98.2 F (36.8 C)-98.8 F (37.1 C)] 98.8 F (37.1 C) (02/09 2156) Pulse Rate:  [88-96] 88 (02/09 2156) Resp:  [17-18] 17 (02/09 2156) BP: (114-120)/(63-68) 120/63 mmHg (02/09 2156) SpO2:  [93 %-97 %] 93 % (02/09 2156) Weight:  [101.833 kg (224 lb 8 oz)] 101.833 kg (224 lb 8 oz) (02/09 1400) Last BM Date: 03/06/15  Intake/Output from previous day: 02/09 0701 - 02/10 0700 In: 1270 [P.O.:1220; I.V.:50] Out: 2050 [Urine:2050] Intake/Output this shift:    General appearance: alert, cooperative and no distress GI: obese, soft; tender around port sites and stay suture sites Incisions - c/d/i; no sign of recurrent hernia or seroma formation; abdominal binder  Lab Results:   Recent Labs  03/07/15 1530 03/08/15 0528  WBC 8.5 6.9  HGB 12.1 11.3*  HCT 38.0 35.0*  PLT 208 194   BMET  Recent Labs  03/07/15 1530 03/08/15 0528  NA  --  143  K  --  4.0  CL  --  108  CO2  --  29  GLUCOSE  --  106*  BUN  --  6  CREATININE 0.85 0.82  CALCIUM  --  8.6*   PT/INR No results for input(s): LABPROT, INR in the last 72 hours. ABG No results for input(s): PHART, HCO3 in the last 72 hours.  Invalid input(s): PCO2, PO2  Studies/Results: No results found.  Anti-infectives: Anti-infectives    Start     Dose/Rate Route Frequency Ordered Stop   03/07/15 1745  ceFAZolin (ANCEF) IVPB 2 g/50 mL premix     2 g 100 mL/hr over 30 Minutes Intravenous 3 times per day 03/07/15 1455 03/07/15 1913   03/07/15 0930  ceFAZolin (ANCEF) 3 g in dextrose 5 % 50 mL IVPB     3 g 130 mL/hr over 30 Minutes Intravenous To ShortStay Surgical 03/06/15 1430 03/07/15 0952      Assessment/Plan: s/p Procedure(s): LAPAROSCOPIC VENTRAL HERNIA (N/A) INSERTION OF MESH (N/A) Awaiting resolution of post-op ileus  Encourage  ambulation HHPT has been arranged Full liquids until better bowel function.   LOS: 3 days    Everett Ricciardelli K. 03/10/2015

## 2015-03-10 NOTE — Progress Notes (Signed)
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 now s/p exp lap with removal of adhesions and ventral hernia repair.    PT Comments    Patient seen for progression of mobility. Patient very eager to mobilize this session. Tolerated increased ambulation distance with improved stability. Patient encouraged to continue mobility throughout the weekend with staff. Will continue to see and progress as tolerated.   Follow Up Recommendations  Home health PT     Equipment Recommendations  None recommended by PT    Recommendations for Other Services       Precautions / Restrictions Precautions Precautions: Fall Required Braces or Orthoses: Other Brace/Splint Other Brace/Splint: abdominal binder Restrictions Weight Bearing Restrictions: No    Mobility  Bed Mobility               General bed mobility comments: received in bathroom  Transfers Overall transfer level: Needs assistance Equipment used: Rolling walker (2 wheeled) Transfers: Sit to/from Stand Sit to Stand: Min guard         General transfer comment: from toilet, increased time to power up, reliance on grab bar and RW for support, educated on LE positioning for leverage  Ambulation/Gait Ambulation/Gait assistance: Supervision Ambulation Distance (Feet): 420 Feet Assistive device: Rolling walker (2 wheeled) Gait Pattern/deviations: Step-through pattern;Decreased stride length;Staggering right;Staggering left Gait velocity: decreased Gait velocity interpretation: Below normal speed for age/gender General Gait Details: remainsslow and cautious, but steady with RW.   Stairs            Wheelchair Mobility    Modified Rankin (Stroke Patients Only)       Balance     Sitting  balance-Leahy Scale: Fair     Standing balance support: No upper extremity supported Standing balance-Leahy Scale: Fair Standing balance comment: able to perform functional tasks at sink without UE support                    Cognition Arousal/Alertness: Awake/alert Behavior During Therapy: WFL for tasks assessed/performed Overall Cognitive Status: Within Functional Limits for tasks assessed                      Exercises      General Comments        Pertinent Vitals/Pain Pain Assessment: No/denies pain Pain Intervention(s): Premedicated before session    Home Living                      Prior Function            PT Goals (current goals can now be found in the care plan section) Acute Rehab PT Goals Patient Stated Goal: To get stronger and go home PT Goal Formulation: With patient Time For Goal Achievement: 03/15/15 Potential to Achieve Goals: Good Progress towards PT goals: Progressing toward goals    Frequency  Min 3X/week    PT Plan Current plan remains appropriate    Co-evaluation             End of Session Equipment Utilized During Treatment: Gait belt Activity Tolerance: Patient tolerated treatment well Patient left: in chair;with call bell/phone within reach     Time: 1238-1300 PT Time Calculation (min) (ACUTE ONLY): 22 min  Charges:  $Gait Training: 8-22 mins  G CodesDuncan Dull 03/30/2015, 1:57 PM Alben Deeds, [place] DPT  [phone]

## 2015-03-10 NOTE — Care Management Important Message (Signed)
Important Message  Patient Details  Name: [name] MRN: [id number] Date of Birth: 1951/01/03   Medicare Important Message Given:  Yes    [name] 03/10/2015, 3:12 PM

## 2015-03-12 NOTE — Progress Notes (Signed)
5 Days Post-Op  Subjective: She has had some problems with pain control but she has gotten out and walked in the halls  Objective: Vital signs in last 24 hours: Temp:  [98 F (36.7 C)-98.6 F (37 C)] 98.6 F (37 C) (02/12 0433) Pulse Rate:  [72-82] 79 (02/12 0433) Resp:  [17-18] 18 (02/12 0433) BP: (111-132)/(72-77) 111/74 mmHg (02/12 0433) SpO2:  [96 %-98 %] 96 % (02/12 0433) Last BM Date: 03/06/15  Intake/Output from previous day: 02/11 0701 - 02/12 0700 In: 1080 [P.O.:1080] Out: 950 [Urine:950] Intake/Output this shift:    Resp: clear to auscultation bilaterally Cardio: regular rate and rhythm GI: soft, mild tenderness. incisions look good  Lab Results:  No results for input(s): WBC, HGB, HCT, PLT in the last 72 hours. BMET No results for input(s): NA, K, CL, CO2, GLUCOSE, BUN, CREATININE, CALCIUM in the last 72 hours. PT/INR No results for input(s): LABPROT, INR in the last 72 hours. ABG No results for input(s): PHART, HCO3 in the last 72 hours.  Invalid input(s): PCO2, PO2  Studies/Results: No results found.  Anti-infectives: Anti-infectives    Start     Dose/Rate Route Frequency Ordered Stop   03/07/15 1745  ceFAZolin (ANCEF) IVPB 2 g/50 mL premix     2 g 100 mL/hr over 30 Minutes Intravenous 3 times per day 03/07/15 1455 03/07/15 1913   03/07/15 0930  ceFAZolin (ANCEF) 3 g in dextrose 5 % 50 mL IVPB     3 g 130 mL/hr over 30 Minutes Intravenous To ShortStay Surgical 03/06/15 1430 03/07/15 0952      Assessment/Plan: s/p Procedure(s): LAPAROSCOPIC VENTRAL HERNIA (N/A) INSERTION OF MESH (N/A) Advance diet  Ambulate No changes to pain meds for now Hopefully home soon  LOS: 5 days    [name],PAUL S 03/12/2015

## 2015-03-12 NOTE — Progress Notes (Signed)
Pt complains of pain at operative site Vitals stable and normal Abdomen: soft. Appropriately tender. Incisions look good S/P Lap VHR Advance diet Continue to work on pain control ambulate

## 2015-03-13 ENCOUNTER — Ambulatory Visit: Payer: Medicare Other | Admitting: Internal Medicine

## 2015-03-13 MED ORDER — POLYETHYLENE GLYCOL 3350 17 G PO PACK
17.0000 g | PACK | Freq: Every day | ORAL | Status: DC
  Administered 2015-03-13: 17 g via ORAL
  Filled 2015-03-13: qty 1

## 2015-03-13 MED ORDER — OXYCODONE-ACETAMINOPHEN 5-325 MG PO TABS
1.0000 | ORAL_TABLET | ORAL | Status: DC | PRN
  Administered 2015-03-13: 2 via ORAL
  Filled 2015-03-13: qty 2

## 2015-03-13 NOTE — Care Management Important Message (Signed)
Important Message  Patient Details  Name: [name] MRN: [id number] Date of Birth: 09-20-1950   Medicare Important Message Given:  Yes    Bexley Mclester P [place] 03/13/2015, 1:54 PM

## 2015-03-13 NOTE — Progress Notes (Signed)
6 Days Post-Op  Subjective: Still very sore around the periphery of the surgical site No nausea or vomiting Tolerating regular diet Flatus, no BM Toradol has expired  Objective: Vital signs in last 24 hours: Temp:  [97.9 F (36.6 C)-98.5 F (36.9 C)] 97.9 F (36.6 C) (02/13 0616) Pulse Rate:  [82-91] 82 (02/13 0616) Resp:  [17-20] 17 (02/13 0616) BP: (112-135)/(54-87) 135/87 mmHg (02/13 0616) SpO2:  [96 %-98 %] 96 % (02/13 0616) Last BM Date: 03/06/15  Intake/Output from previous day: 02/12 0701 - 02/13 0700 In: 920 [P.O.:920] Out: 1750 [Urine:1750] Intake/Output this shift:    General appearance: alert, cooperative and no distress Resp: clear to auscultation bilaterally Cardio: regular rate and rhythm, S1, S2 normal, no murmur, click, rub or gallop GI: obese, soft; no sign of cellulitis or induration; no sign of ventral hernia; tender below surgical site and RLQ Incisions c/d/i  Lab Results:  No results for input(s): WBC, HGB, HCT, PLT in the last 72 hours. BMET No results for input(s): NA, K, CL, CO2, GLUCOSE, BUN, CREATININE, CALCIUM in the last 72 hours. PT/INR No results for input(s): LABPROT, INR in the last 72 hours. ABG No results for input(s): PHART, HCO3 in the last 72 hours.  Invalid input(s): PCO2, PO2  Studies/Results: No results found.  Anti-infectives: Anti-infectives    Start     Dose/Rate Route Frequency Ordered Stop   03/07/15 1745  ceFAZolin (ANCEF) IVPB 2 g/50 mL premix     2 g 100 mL/hr over 30 Minutes Intravenous 3 times per day 03/07/15 1455 03/07/15 1913   03/07/15 0930  ceFAZolin (ANCEF) 3 g in dextrose 5 % 50 mL IVPB     3 g 130 mL/hr over 30 Minutes Intravenous To ShortStay Surgical 03/06/15 1430 03/07/15 0952      Assessment/Plan: s/p Procedure(s): LAPAROSCOPIC VENTRAL HERNIA (N/A) INSERTION OF MESH (N/A) Plan for discharge tomorrow Change from Oxy IR to Percocet  Robaxin PRN Remove IV Heating Pad Ambulate   LOS: 6 days     Quintana Canelo K. 03/13/2015

## 2015-03-13 NOTE — Progress Notes (Signed)
Physical Therapy Treatment Patient Details Name: [name] MRN: [id number] DOB: 1950-05-06 Today's Date: 03/13/2015    History of Present Illness Patient is a 65 y/o female admitted with Recurrent ventral hernia x 3 with extensive intra-abdominal adhesions. now s/p exp lap with removal of adhesions and ventral hernia repair.    PT Comments    Patient is making good progress with PT.  From a mobility standpoint anticipate patient will be ready for DC home when medically ready.     Follow Up Recommendations  Home health PT     Equipment Recommendations  None recommended by PT    Recommendations for Other Services       Precautions / Restrictions Precautions Precautions: Fall Required Braces or Orthoses: Other Brace/Splint Other Brace/Splint: abdominal binder Restrictions Weight Bearing Restrictions: No    Mobility  Bed Mobility Overal bed mobility: Needs Assistance Bed Mobility: Supine to Sit;Sit to Supine     Supine to sit: Supervision Sit to supine: Supervision   General bed mobility comments: vc for sequencing and increased time needed but no physical assist required; supervision for safety  Transfers Overall transfer level: Needs assistance Equipment used: Rolling walker (2 wheeled) Transfers: Sit to/from Stand Sit to Stand: Supervision         General transfer comment: from EOB and commode; supervision for safety; carry over of safe hand placement  Ambulation/Gait Ambulation/Gait assistance: Supervision Ambulation Distance (Feet): 200 Feet Assistive device: Rolling walker (2 wheeled) Gait Pattern/deviations: Step-through pattern;Decreased stride length Gait velocity: decreased   General Gait Details: slow and guarded steps; safe use of AD   Stairs Stairs: Yes Stairs assistance: Min guard Stair Management: One rail Right;Sideways Number of Stairs: 6 General stair comments: good safety awareness; pt educated on seuqencing and techique; no  unsteadiness  Wheelchair Mobility    Modified Rankin (Stroke Patients Only)       Balance Overall balance assessment: Needs assistance Sitting-balance support: Feet supported Sitting balance-Leahy Scale: Good     Standing balance support: Single extremity supported Standing balance-Leahy Scale: Fair                      Cognition Arousal/Alertness: Awake/alert Behavior During Therapy: WFL for tasks assessed/performed Overall Cognitive Status: Within Functional Limits for tasks assessed                      Exercises General Exercises - Lower Extremity Ankle Circles/Pumps: AROM;Both;10 reps;Seated Long Arc Quad: AROM;Both;10 reps;Seated    General Comments        Pertinent Vitals/Pain Pain Assessment: Faces Faces Pain Scale: Hurts little more Pain Location: abdomen Pain Descriptors / Indicators: Sore Pain Intervention(s): Limited activity within patient's tolerance;Monitored during session;Premedicated before session;Repositioned    Home Living                      Prior Function            PT Goals (current goals can now be found in the care plan section) Acute Rehab PT Goals Patient Stated Goal: go home PT Goal Formulation: With patient Time For Goal Achievement: 03/15/15 Potential to Achieve Goals: Good Progress towards PT goals: Progressing toward goals    Frequency  Min 3X/week    PT Plan Current plan remains appropriate    Co-evaluation             End of Session Equipment Utilized During Treatment: Gait belt Activity Tolerance: Patient tolerated treatment well  Patient left: with call bell/phone within reach;in bed     Time: 1524-1601 PT Time Calculation (min) (ACUTE ONLY): 37 min  Charges:  $Gait Training: 8-22 mins $Therapeutic Activity: 8-22 mins                    G Codes:      [name], PTA Pager: [phone]   03/13/2015, 4:41 PM

## 2015-03-14 LAB — CREATININE, SERUM
CREATININE: 1.03 mg/dL — AB (ref 0.44–1.00)
GFR calc Af Amer: >60 mL/min (ref >60)
GFR, EST NON AFRICAN AMERICAN: 56 mL/min — AB (ref >60)

## 2015-03-14 MED ORDER — OXYCODONE-ACETAMINOPHEN 5-325 MG PO TABS: 1.0000 | ORAL_TABLET | ORAL | Status: DC | PRN

## 2015-03-14 MED ORDER — METHOCARBAMOL 500 MG PO TABS: 500.0000 mg | ORAL_TABLET | Freq: Four times a day (QID) | ORAL | Status: DC | PRN

## 2015-03-14 NOTE — Discharge Summary (Signed)
Physician Discharge Summary  Patient ID: [name] MRN: [id number] DOB/AGE: October 28, 1950 65 y.o.  Admit date: 03/07/2015 Discharge date: 03/14/2015  Admission Diagnoses:  Recurrent ventral hernia  Discharge Diagnoses: same Active Problems:   Recurrent ventral hernia   Discharged Condition: good  Hospital Course: Laparoscopic lysis of adhesions (90 min) and laparoscopic ventral hernia repair with mesh on 03/07/15.  The patient had significant post-op ileus for a few days.  Her bowel function slowly returned.  We mobilized her with physical therapy.  Pain control was an issue, but we settled on a regimen of Percocet with additional Robaxin.  Consults: None  Significant Diagnostic Studies: none  Treatments: surgery: lap lysis of adhesions/ ventral hernia repair with mesh  Discharge Exam: Blood pressure 121/78, pulse 79, temperature 98.4 F (36.9 C), temperature source Oral, resp. rate 18, height 5\' 2"  (1.575 m), weight 101.833 kg (224 lb 8 oz), SpO2 98 %. General appearance: alert, cooperative and no distress GI: soft, tender around stay suture sites, especially in RLQ Incisions c/d/i  Disposition: 01-Home or [facility]  Discharge Instructions    Call MD for:  persistant nausea and vomiting    Complete by:  As directed      Call MD for:  redness, tenderness, or signs of infection (pain, swelling, redness, odor or green/yellow discharge around incision site)    Complete by:  As directed      Call MD for:  severe uncontrolled pain    Complete by:  As directed      Call MD for:  temperature >100.4    Complete by:  As directed      Diet general    Complete by:  As directed      Driving Restrictions    Complete by:  As directed   Do not drive while taking pain medications     Increase activity slowly    Complete by:  As directed      May shower / Bathe    Complete by:  As directed      May walk up steps    Complete by:  As directed             Medication List    TAKE these medications        albuterol 108 (90 Base) MCG/ACT inhaler  Commonly known as:  PROAIR HFA  INHALE 2 PUFFS INTO THE LUNGS BY MOUTH EVERY 6 HOURS AS NEEDED FOR WHEEZING/SHORTNESS OF BREATH     albuterol (2.5 MG/3ML) 0.083% nebulizer solution  Commonly known as:  PROVENTIL  Take 3 mLs (2.5 mg total) by nebulization every 6 (six) hours as needed for wheezing or shortness of breath.     aspirin 81 MG tablet  Take 81 mg by mouth daily.     cetirizine 10 MG tablet  Commonly known as:  ZYRTEC  Take 1 tablet (10 mg total) by mouth daily.     ferrous sulfate 325 (65 FE) MG tablet  Take 325 mg by mouth 3 (three) times daily with meals.     fluticasone furoate-vilanterol 100-25 MCG/INH Aepb  Commonly known as:  BREO ELLIPTA  Inhale 1 puff, then rinse mouth, once daily     furosemide 20 MG tablet  Commonly known as:  LASIX  Take 20-40 mg by mouth daily as needed for fluid.     HYDROcodone-acetaminophen 5-325 MG tablet  Commonly known as:  NORCO/VICODIN  Take 1 tablet by mouth every 6 (six)  hours as needed.     methocarbamol 500 MG tablet  Commonly known as:  ROBAXIN  Take 1 tablet (500 mg total) by mouth every 6 (six) hours as needed for muscle spasms.     mometasone 50 MCG/ACT nasal spray  Commonly known as:  NASONEX  Place 2 sprays into the nose daily as needed (allergies).     montelukast 10 MG tablet  Commonly known as:  SINGULAIR  Take 1 tablet (10 mg total) by mouth at bedtime.     naproxen 500 MG tablet  Commonly known as:  NAPROSYN  Take 1 tablet (500 mg total) by mouth 2 (two) times daily with a meal.     NONFORMULARY OR COMPOUNDED ITEM  Inject 1 Applicatorful as directed once a week. Allergy Vaccine 1:10 Given at [facility]     omeprazole 20 MG capsule  Commonly known as:  PRILOSEC  Take 20 mg by mouth daily.     oxyCODONE-acetaminophen 5-325 MG tablet  Commonly known as:  PERCOCET/ROXICET  Take 1 tablet by mouth  every 8 (eight) hours as needed for moderate pain or severe pain.     oxyCODONE-acetaminophen 5-325 MG tablet  Commonly known as:  PERCOCET/ROXICET  Take 1-2 tablets by mouth every 4 (four) hours as needed for moderate pain.     potassium chloride 10 MEQ tablet  Commonly known as:  K-DUR  Take 1 tablet (10 mEq total) by mouth daily.     ranitidine 150 MG tablet  Commonly known as:  ZANTAC  Take 1 tablet (150 mg total) by mouth at bedtime.     valsartan-hydrochlorothiazide 160-25 MG tablet  Commonly known as:  DIOVAN-HCT  Take 1 tablet by mouth daily.     Vitamin B-12 1000 MCG Subl  Place 1 tablet (1,000 mcg total) under the tongue daily.     Vitamin D 1000 units capsule  Take 1,000 Units by mouth every morning.           Follow-up Information    Follow up with Rahul Malinak K., MD. Schedule an appointment as soon as possible for a visit in 3 weeks.   Specialty:  General Surgery   Contact information:   [address] [place] [address] [phone]       Signed: Michaelah Credeur K. 03/14/2015, 7:01 AM

## 2015-03-14 NOTE — Progress Notes (Signed)
Verbally understood DC instructions, no questions asked. Home with husband

## 2015-03-14 NOTE — Care Management (Signed)
Updated notes and discharge summary faxed to [place] at [facility] . Dianna aware patient discharging to home today .   Magdalen Spatz RN BSN [phone]

## 2015-03-15 ENCOUNTER — Telehealth: Payer: Self-pay | Admitting: *Deleted

## 2015-03-15 DIAGNOSIS — M6281 Muscle weakness (generalized): Secondary | ICD-10-CM | POA: Diagnosis not present

## 2015-03-15 DIAGNOSIS — R262 Difficulty in walking, not elsewhere classified: Secondary | ICD-10-CM | POA: Diagnosis not present

## 2015-03-15 DIAGNOSIS — K439 Ventral hernia without obstruction or gangrene: Secondary | ICD-10-CM | POA: Diagnosis not present

## 2015-03-15 NOTE — Telephone Encounter (Signed)
Pt was on TCM list admitted for recurrent ventral hernia. D/C 2/14 will be f/u with specialist Dr. Georgette Dover...Johny Chess

## 2015-03-17 DIAGNOSIS — K439 Ventral hernia without obstruction or gangrene: Secondary | ICD-10-CM | POA: Diagnosis not present

## 2015-03-17 DIAGNOSIS — R262 Difficulty in walking, not elsewhere classified: Secondary | ICD-10-CM | POA: Diagnosis not present

## 2015-03-17 DIAGNOSIS — M6281 Muscle weakness (generalized): Secondary | ICD-10-CM | POA: Diagnosis not present

## 2015-03-20 DIAGNOSIS — M6281 Muscle weakness (generalized): Secondary | ICD-10-CM | POA: Diagnosis not present

## 2015-03-20 DIAGNOSIS — K439 Ventral hernia without obstruction or gangrene: Secondary | ICD-10-CM | POA: Diagnosis not present

## 2015-03-20 DIAGNOSIS — R262 Difficulty in walking, not elsewhere classified: Secondary | ICD-10-CM | POA: Diagnosis not present

## 2015-03-23 DIAGNOSIS — M6281 Muscle weakness (generalized): Secondary | ICD-10-CM | POA: Diagnosis not present

## 2015-03-23 DIAGNOSIS — R262 Difficulty in walking, not elsewhere classified: Secondary | ICD-10-CM | POA: Diagnosis not present

## 2015-03-23 DIAGNOSIS — K439 Ventral hernia without obstruction or gangrene: Secondary | ICD-10-CM | POA: Diagnosis not present

## 2015-03-28 ENCOUNTER — Ambulatory Visit (INDEPENDENT_AMBULATORY_CARE_PROVIDER_SITE_OTHER): Payer: Medicare Other | Admitting: *Deleted

## 2015-03-28 DIAGNOSIS — J309 Allergic rhinitis, unspecified: Secondary | ICD-10-CM | POA: Diagnosis not present

## 2015-03-29 DIAGNOSIS — R262 Difficulty in walking, not elsewhere classified: Secondary | ICD-10-CM | POA: Diagnosis not present

## 2015-03-29 DIAGNOSIS — K439 Ventral hernia without obstruction or gangrene: Secondary | ICD-10-CM | POA: Diagnosis not present

## 2015-03-29 DIAGNOSIS — M6281 Muscle weakness (generalized): Secondary | ICD-10-CM | POA: Diagnosis not present

## 2015-03-30 DIAGNOSIS — K439 Ventral hernia without obstruction or gangrene: Secondary | ICD-10-CM | POA: Diagnosis not present

## 2015-03-30 DIAGNOSIS — R262 Difficulty in walking, not elsewhere classified: Secondary | ICD-10-CM | POA: Diagnosis not present

## 2015-03-30 DIAGNOSIS — M6281 Muscle weakness (generalized): Secondary | ICD-10-CM | POA: Diagnosis not present

## 2015-04-04 DIAGNOSIS — M6281 Muscle weakness (generalized): Secondary | ICD-10-CM | POA: Diagnosis not present

## 2015-04-04 DIAGNOSIS — K439 Ventral hernia without obstruction or gangrene: Secondary | ICD-10-CM | POA: Diagnosis not present

## 2015-04-04 DIAGNOSIS — R262 Difficulty in walking, not elsewhere classified: Secondary | ICD-10-CM | POA: Diagnosis not present

## 2015-04-06 ENCOUNTER — Ambulatory Visit (INDEPENDENT_AMBULATORY_CARE_PROVIDER_SITE_OTHER): Payer: Medicare Other | Admitting: *Deleted

## 2015-04-06 DIAGNOSIS — J309 Allergic rhinitis, unspecified: Secondary | ICD-10-CM

## 2015-04-11 ENCOUNTER — Other Ambulatory Visit (HOSPITAL_BASED_OUTPATIENT_CLINIC_OR_DEPARTMENT_OTHER): Payer: Medicare Other

## 2015-04-11 ENCOUNTER — Ambulatory Visit (HOSPITAL_BASED_OUTPATIENT_CLINIC_OR_DEPARTMENT_OTHER): Payer: Medicare Other | Admitting: Nurse Practitioner

## 2015-04-11 ENCOUNTER — Telehealth: Payer: Self-pay | Admitting: Oncology

## 2015-04-11 ENCOUNTER — Ambulatory Visit (INDEPENDENT_AMBULATORY_CARE_PROVIDER_SITE_OTHER): Payer: Medicare Other

## 2015-04-11 VITALS — BP 135/68 | HR 60 | Temp 98.0°F | Resp 18 | Ht 62.0 in | Wt 227.0 lb

## 2015-04-11 DIAGNOSIS — J309 Allergic rhinitis, unspecified: Secondary | ICD-10-CM

## 2015-04-11 DIAGNOSIS — C184 Malignant neoplasm of transverse colon: Secondary | ICD-10-CM

## 2015-04-11 DIAGNOSIS — Z85038 Personal history of other malignant neoplasm of large intestine: Secondary | ICD-10-CM

## 2015-04-11 DIAGNOSIS — D649 Anemia, unspecified: Secondary | ICD-10-CM

## 2015-04-11 DIAGNOSIS — L271 Localized skin eruption due to drugs and medicaments taken internally: Secondary | ICD-10-CM

## 2015-04-11 LAB — CBC WITH DIFFERENTIAL/PLATELET
BASO%: 0.7 % (ref 0.0–2.0)
Basophils Absolute: 0 10*3/uL (ref 0.0–0.1)
EOS ABS: 0.2 10*3/uL (ref 0.0–0.5)
EOS%: 5 % (ref 0.0–7.0)
HCT: 36.1 % (ref 34.8–46.6)
HEMOGLOBIN: 11.6 g/dL (ref 11.6–15.9)
LYMPH#: 1.4 10*3/uL (ref 0.9–3.3)
LYMPH%: 31.1 % (ref 14.0–49.7)
MCH: 24.1 pg — ABNORMAL LOW (ref 25.1–34.0)
MCHC: 32.1 g/dL (ref 31.5–36.0)
MCV: 75.1 fL — ABNORMAL LOW (ref 79.5–101.0)
MONO#: 0.4 10*3/uL (ref 0.1–0.9)
MONO%: 9.2 % (ref 0.0–14.0)
NEUT%: 54 % (ref 38.4–76.8)
NEUTROS ABS: 2.4 10*3/uL (ref 1.5–6.5)
Platelets: 162 10*3/uL (ref 145–400)
RBC: 4.81 10*6/uL (ref 3.70–5.45)
RDW: 16.9 % — AB (ref 11.2–14.5)
WBC: 4.4 10*3/uL (ref 3.9–10.3)

## 2015-04-11 LAB — FERRITIN: FERRITIN: 147 ng/mL (ref 9–269)

## 2015-04-11 LAB — CHCC SMEAR

## 2015-04-11 NOTE — Progress Notes (Signed)
  [facility] OFFICE PROGRESS NOTE   Diagnosis:   Colon cancer  INTERVAL HISTORY:   Ms. Steichen returns as scheduled. She feels well overall. She underwent repair of a ventral hernia 03/07/2015. She has occasional slight right-sided abdominal pain. Bowels moving regularly. No constipation or diarrhea. No bloody or black stools. She has a good appetite.  Objective:  Vital signs in last 24 hours:  Blood pressure 135/68, pulse 60, temperature 98 F (36.7 C), temperature source Oral, resp. rate 18, height _0  (1.575 m), weight 227 lb (102.967 kg), SpO2 98 %.    HEENT: No thrush or ulcers. Lymphatics: No palpable cervical, supraclavicular, axillary or inguinal lymph nodes. Resp: Lungs clear bilaterally. Cardio: Regular rate and rhythm. GI: Abdomen soft and nontender. No organomegaly. No mass. Vascular: No leg edema.  Lab Results:  Lab Results  Component Value Date   WBC 4.4 04/11/2015   HGB 11.6 04/11/2015   HCT 36.1 04/11/2015   MCV 75.1* 04/11/2015   PLT 162 04/11/2015   NEUTROABS 2.4 04/11/2015    Imaging:  No results found.  Medications: I have reviewed the patient's current medications.  Assessment/Plan: 1. 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   Presentation to the emergency room 02/02/2013 with a colonic obstruction secondary to tumor at the splenic flexure.   Baseline CEA on 02/02/2013 less than 0.5.   Initiation of adjuvant Xeloda 04/10/2013.   Cycle 2 adjuvant Xeloda 05/01/2013.   Cycle 3 adjuvant Xeloda 05/22/2013.   Cycle 4 adjuvant Xeloda 06/12/2013.   Cycle 5 adjuvant Xeloda 07/03/2013 (Xeloda dose reduced due to hand foot syndrome).   Cycle 6 adjuvant  Xeloda 07/24/2013.   Cycle 7 adjuvant Xeloda 08/14/2013   Cycle 8 adjuvant Xeloda 09/07/2013.  2. History of iron deficiency anemia. Recurrent anemia 02/01/2014, improved 3. Asthma. 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 5. Status post ostomy reversal 11/25/2013. 6. CT 02/01/2014 with no evidence of local tumor recurrence or metastatic disease. New area of masslike thickening and small bowel dilatation at the mid small bowel  Status post deep enteroscopy at [facility] on 03/16/2014, negative.  Status post capsule endoscopy 03/22/2014, confirmed a small bowel tumor  Exploratory laparotomy with resection of a small bowel mass on 04/06/2014 with the pathology confirming invasive adenocarcinoma extending through small bowel wall and involving adjacent loops of adherent small bowel, 2 of 12 lymph nodes positive. History consistent with recurrent colon cancer. Loss of MLH1 and PMS 2, MSI high as was the January 2015 tumor   Disposition: [name] remains in clinical remission from colon cancer. We will follow-up on the CEA from today. She will return for a follow-up visit and labs in 3 months.    Ned Card ANP/GNP-BC   04/11/2015  10:36 AM

## 2015-04-11 NOTE — Telephone Encounter (Signed)
Gave and pritned appt sched and avs for pt for JUNE °

## 2015-04-12 ENCOUNTER — Telehealth: Payer: Self-pay | Admitting: *Deleted

## 2015-04-12 LAB — CEA (PARALLEL TESTING): CEA: 0.5 ng/mL

## 2015-04-12 LAB — CEA: CEA: 1.3 ng/mL (ref 0.0–4.7)

## 2015-04-12 NOTE — Telephone Encounter (Signed)
Per [name], pt notified that CEA level is normal.  Left message on pt.'s private cell phone to call us back if she has any questions or concerns.

## 2015-04-12 NOTE — Telephone Encounter (Signed)
-----   Message from Ladell Pier, MD sent at 04/12/2015  4:15 PM EDT ----- Please call patient, CEA is normal,f/u as scheduled

## 2015-04-18 ENCOUNTER — Ambulatory Visit (INDEPENDENT_AMBULATORY_CARE_PROVIDER_SITE_OTHER): Payer: Medicare Other | Admitting: *Deleted

## 2015-04-18 DIAGNOSIS — J309 Allergic rhinitis, unspecified: Secondary | ICD-10-CM | POA: Diagnosis not present

## 2015-04-26 ENCOUNTER — Ambulatory Visit (INDEPENDENT_AMBULATORY_CARE_PROVIDER_SITE_OTHER): Payer: Medicare Other | Admitting: *Deleted

## 2015-04-26 DIAGNOSIS — J309 Allergic rhinitis, unspecified: Secondary | ICD-10-CM

## 2015-05-03 ENCOUNTER — Ambulatory Visit (INDEPENDENT_AMBULATORY_CARE_PROVIDER_SITE_OTHER): Payer: Medicare Other | Admitting: *Deleted

## 2015-05-03 DIAGNOSIS — J309 Allergic rhinitis, unspecified: Secondary | ICD-10-CM

## 2015-05-11 ENCOUNTER — Ambulatory Visit (INDEPENDENT_AMBULATORY_CARE_PROVIDER_SITE_OTHER): Payer: Medicare Other | Admitting: *Deleted

## 2015-05-11 DIAGNOSIS — J309 Allergic rhinitis, unspecified: Secondary | ICD-10-CM | POA: Diagnosis not present

## 2015-05-18 ENCOUNTER — Ambulatory Visit (INDEPENDENT_AMBULATORY_CARE_PROVIDER_SITE_OTHER): Payer: Medicare Other | Admitting: *Deleted

## 2015-05-18 DIAGNOSIS — J309 Allergic rhinitis, unspecified: Secondary | ICD-10-CM | POA: Diagnosis not present

## 2015-05-22 ENCOUNTER — Telehealth: Payer: Self-pay | Admitting: Internal Medicine

## 2015-05-22 MED ORDER — RANITIDINE HCL 150 MG PO TABS: 150.0000 mg | ORAL_TABLET | Freq: Every day | ORAL | Status: DC

## 2015-05-22 NOTE — Telephone Encounter (Signed)
Spoke with pt and she would like ranitidine sent in to [facility]. She asked for any other meds that needed refills to be sent in also. Per chart pt's other meds do not need refills.

## 2015-05-24 ENCOUNTER — Ambulatory Visit (INDEPENDENT_AMBULATORY_CARE_PROVIDER_SITE_OTHER): Payer: Medicare Other | Admitting: *Deleted

## 2015-05-24 DIAGNOSIS — J309 Allergic rhinitis, unspecified: Secondary | ICD-10-CM

## 2015-05-31 ENCOUNTER — Ambulatory Visit (INDEPENDENT_AMBULATORY_CARE_PROVIDER_SITE_OTHER): Payer: Medicare Other | Admitting: *Deleted

## 2015-05-31 DIAGNOSIS — J309 Allergic rhinitis, unspecified: Secondary | ICD-10-CM

## 2015-06-07 ENCOUNTER — Ambulatory Visit (INDEPENDENT_AMBULATORY_CARE_PROVIDER_SITE_OTHER): Payer: Medicare Other | Admitting: *Deleted

## 2015-06-07 DIAGNOSIS — J309 Allergic rhinitis, unspecified: Secondary | ICD-10-CM | POA: Diagnosis not present

## 2015-06-14 ENCOUNTER — Ambulatory Visit (INDEPENDENT_AMBULATORY_CARE_PROVIDER_SITE_OTHER): Payer: Medicare Other | Admitting: *Deleted

## 2015-06-14 DIAGNOSIS — J309 Allergic rhinitis, unspecified: Secondary | ICD-10-CM | POA: Diagnosis not present

## 2015-06-15 ENCOUNTER — Other Ambulatory Visit: Payer: Self-pay | Admitting: *Deleted

## 2015-06-15 MED ORDER — NAPROXEN 500 MG PO TABS: 500.0000 mg | ORAL_TABLET | Freq: Two times a day (BID) | ORAL | Status: DC

## 2015-06-16 ENCOUNTER — Telehealth: Payer: Self-pay | Admitting: Internal Medicine

## 2015-06-16 MED ORDER — PREDNISONE 20 MG PO TABS: 20.0000 mg | ORAL_TABLET | Freq: Every day | ORAL | Status: DC

## 2015-06-16 NOTE — Telephone Encounter (Signed)
Spoke with pt. Reports dry cough, PND and sore throat x2 days. Wheezing is present when she lays down. Denies chest tightness, SOB or fever. Symptoms started after her allergy shot on Wednesday. Has not been taking any OTC medications. Would like CY's recommendations.  No Known Allergies Current Outpatient Prescriptions on File Prior to Visit  Medication Sig Dispense Refill  . albuterol (PROAIR HFA) 108 (90 Base) MCG/ACT inhaler INHALE 2 PUFFS INTO THE LUNGS BY MOUTH EVERY 6 HOURS AS NEEDED FOR WHEEZING/SHORTNESS OF BREATH 25.5 each 11  . albuterol (PROVENTIL) (2.5 MG/3ML) 0.083% nebulizer solution Take 3 mLs (2.5 mg total) by nebulization every 6 (six) hours as needed for wheezing or shortness of breath. 75 mL 12  . aspirin 81 MG tablet Take 81 mg by mouth daily.     . cetirizine (ZYRTEC) 10 MG tablet Take 1 tablet (10 mg total) by mouth daily. 100 tablet 3  . Cholecalciferol (VITAMIN D) 1000 UNITS capsule Take 1,000 Units by mouth every morning.     . Cyanocobalamin (VITAMIN B-12) 1000 MCG SUBL Place 1 tablet (1,000 mcg total) under the tongue daily. 100 tablet 3  . ferrous sulfate 325 (65 FE) MG tablet Take 325 mg by mouth 3 (three) times daily with meals.     . Fluticasone Furoate-Vilanterol (BREO ELLIPTA) 100-25 MCG/INH AEPB Inhale 1 puff, then rinse mouth, once daily 60 each 12  . furosemide (LASIX) 20 MG tablet Take 20-40 mg by mouth daily as needed for fluid.    Marland Kitchen HYDROcodone-acetaminophen (NORCO/VICODIN) 5-325 MG tablet Take 1 tablet by mouth every 6 (six) hours as needed. 8 tablet 0  . methocarbamol (ROBAXIN) 500 MG tablet Take 1 tablet (500 mg total) by mouth every 6 (six) hours as needed for muscle spasms. 30 tablet 0  . mometasone (NASONEX) 50 MCG/ACT nasal spray Place 2 sprays into the nose daily as needed (allergies). 17 g 11  . montelukast (SINGULAIR) 10 MG tablet Take 1 tablet (10 mg total) by mouth at bedtime. 90 tablet 3  . naproxen (NAPROSYN) 500 MG tablet Take 1 tablet  (500 mg total) by mouth 2 (two) times daily with a meal. 30 tablet 2  . NONFORMULARY OR COMPOUNDED ITEM Inject 1 Applicatorful as directed once a week. Allergy Vaccine 1:10 Given at [facility]    . omeprazole (PRILOSEC) 20 MG capsule Take 20 mg by mouth daily.    Marland Kitchen oxyCODONE-acetaminophen (PERCOCET/ROXICET) 5-325 MG tablet Take 1-2 tablets by mouth every 4 (four) hours as needed for moderate pain. 30 tablet 0  . potassium chloride (K-DUR) 10 MEQ tablet Take 1 tablet (10 mEq total) by mouth daily. 90 tablet 3  . ranitidine (ZANTAC) 150 MG tablet Take 1 tablet (150 mg total) by mouth at bedtime. 30 tablet 5  . valsartan-hydrochlorothiazide (DIOVAN-HCT) 160-25 MG tablet Take 1 tablet by mouth daily. 90 tablet 3   No current facility-administered medications on file prior to visit.    CY - please advise. Thanks.

## 2015-06-16 NOTE — Telephone Encounter (Signed)
Spoke with pt. She has been made aware of CY's recommendation. Rx has been sent in. Nothing further was needed.

## 2015-06-16 NOTE — Telephone Encounter (Signed)
Offer prednisone 20 mg, # 4, 1 daily x 4 days 

## 2015-06-21 ENCOUNTER — Ambulatory Visit (INDEPENDENT_AMBULATORY_CARE_PROVIDER_SITE_OTHER): Payer: Medicare Other

## 2015-06-21 ENCOUNTER — Ambulatory Visit: Payer: Medicare Other

## 2015-06-21 ENCOUNTER — Telehealth: Payer: Self-pay | Admitting: Internal Medicine

## 2015-06-21 DIAGNOSIS — J309 Allergic rhinitis, unspecified: Secondary | ICD-10-CM | POA: Diagnosis not present

## 2015-06-21 MED ORDER — AZITHROMYCIN 250 MG PO TABS: ORAL_TABLET | ORAL | Status: DC

## 2015-06-21 NOTE — Telephone Encounter (Signed)
Per CY ok for zpak. Rx sent. Pt notified.

## 2015-06-21 NOTE — Telephone Encounter (Signed)
Pt c/o prod cough with yellow/clear mucus, runny nose, sinus congestion, PND. Denies chest pain, fever, unusual SOB.   Pt took 20mg  prednisone qd X4 days as prescribed on Friday by CY.  Has not taken anything else to help with s/s.   Pt uses [facility] in [place].   Last ov: 02/28/15 Next ov: 08/28/15  CY please advise on recs.  Thanks!   No Known Allergies Current Outpatient Prescriptions on File Prior to Visit  Medication Sig Dispense Refill  . albuterol (PROAIR HFA) 108 (90 Base) MCG/ACT inhaler INHALE 2 PUFFS INTO THE LUNGS BY MOUTH EVERY 6 HOURS AS NEEDED FOR WHEEZING/SHORTNESS OF BREATH 25.5 each 11  . albuterol (PROVENTIL) (2.5 MG/3ML) 0.083% nebulizer solution Take 3 mLs (2.5 mg total) by nebulization every 6 (six) hours as needed for wheezing or shortness of breath. 75 mL 12  . aspirin 81 MG tablet Take 81 mg by mouth daily.     . cetirizine (ZYRTEC) 10 MG tablet Take 1 tablet (10 mg total) by mouth daily. 100 tablet 3  . Cholecalciferol (VITAMIN D) 1000 UNITS capsule Take 1,000 Units by mouth every morning.     . Cyanocobalamin (VITAMIN B-12) 1000 MCG SUBL Place 1 tablet (1,000 mcg total) under the tongue daily. 100 tablet 3  . ferrous sulfate 325 (65 FE) MG tablet Take 325 mg by mouth 3 (three) times daily with meals.     . Fluticasone Furoate-Vilanterol (BREO ELLIPTA) 100-25 MCG/INH AEPB Inhale 1 puff, then rinse mouth, once daily 60 each 12  . furosemide (LASIX) 20 MG tablet Take 20-40 mg by mouth daily as needed for fluid.    Marland Kitchen HYDROcodone-acetaminophen (NORCO/VICODIN) 5-325 MG tablet Take 1 tablet by mouth every 6 (six) hours as needed. 8 tablet 0  . methocarbamol (ROBAXIN) 500 MG tablet Take 1 tablet (500 mg total) by mouth every 6 (six) hours as needed for muscle spasms. 30 tablet 0  . mometasone (NASONEX) 50 MCG/ACT nasal spray Place 2 sprays into the nose daily as needed (allergies). 17 g 11  . montelukast (SINGULAIR) 10 MG tablet Take 1 tablet (10 mg total) by mouth at  bedtime. 90 tablet 3  . naproxen (NAPROSYN) 500 MG tablet Take 1 tablet (500 mg total) by mouth 2 (two) times daily with a meal. 30 tablet 2  . NONFORMULARY OR COMPOUNDED ITEM Inject 1 Applicatorful as directed once a week. Allergy Vaccine 1:10 Given at [facility]    . omeprazole (PRILOSEC) 20 MG capsule Take 20 mg by mouth daily.    Marland Kitchen oxyCODONE-acetaminophen (PERCOCET/ROXICET) 5-325 MG tablet Take 1-2 tablets by mouth every 4 (four) hours as needed for moderate pain. 30 tablet 0  . potassium chloride (K-DUR) 10 MEQ tablet Take 1 tablet (10 mEq total) by mouth daily. 90 tablet 3  . predniSONE (DELTASONE) 20 MG tablet Take 1 tablet (20 mg total) by mouth daily with breakfast. 4 tablet 0  . ranitidine (ZANTAC) 150 MG tablet Take 1 tablet (150 mg total) by mouth at bedtime. 30 tablet 5  . valsartan-hydrochlorothiazide (DIOVAN-HCT) 160-25 MG tablet Take 1 tablet by mouth daily. 90 tablet 3   No current facility-administered medications on file prior to visit.

## 2015-06-23 DIAGNOSIS — J309 Allergic rhinitis, unspecified: Secondary | ICD-10-CM | POA: Diagnosis not present

## 2015-06-27 ENCOUNTER — Ambulatory Visit (INDEPENDENT_AMBULATORY_CARE_PROVIDER_SITE_OTHER): Payer: Medicare Other | Admitting: *Deleted

## 2015-06-27 ENCOUNTER — Telehealth: Payer: Self-pay | Admitting: Internal Medicine

## 2015-06-27 ENCOUNTER — Encounter: Payer: Self-pay | Admitting: Internal Medicine

## 2015-06-27 ENCOUNTER — Ambulatory Visit (INDEPENDENT_AMBULATORY_CARE_PROVIDER_SITE_OTHER): Payer: Medicare Other | Admitting: Internal Medicine

## 2015-06-27 VITALS — BP 128/90 | HR 76 | Wt 225.0 lb

## 2015-06-27 DIAGNOSIS — G8929 Other chronic pain: Secondary | ICD-10-CM

## 2015-06-27 DIAGNOSIS — J45909 Unspecified asthma, uncomplicated: Secondary | ICD-10-CM

## 2015-06-27 DIAGNOSIS — M545 Low back pain: Secondary | ICD-10-CM

## 2015-06-27 DIAGNOSIS — M25562 Pain in left knee: Secondary | ICD-10-CM

## 2015-06-27 DIAGNOSIS — J309 Allergic rhinitis, unspecified: Secondary | ICD-10-CM | POA: Diagnosis not present

## 2015-06-27 DIAGNOSIS — I10 Essential (primary) hypertension: Secondary | ICD-10-CM

## 2015-06-27 DIAGNOSIS — C184 Malignant neoplasm of transverse colon: Secondary | ICD-10-CM

## 2015-06-27 DIAGNOSIS — M25561 Pain in right knee: Secondary | ICD-10-CM

## 2015-06-27 MED ORDER — POTASSIUM CHLORIDE ER 10 MEQ PO TBCR: 10.0000 meq | EXTENDED_RELEASE_TABLET | Freq: Every day | ORAL | Status: DC

## 2015-06-27 MED ORDER — NAPROXEN 500 MG PO TABS: 500.0000 mg | ORAL_TABLET | Freq: Two times a day (BID) | ORAL | Status: DC | PRN

## 2015-06-27 MED ORDER — OXYCODONE-ACETAMINOPHEN 5-325 MG PO TABS: 1.0000 | ORAL_TABLET | ORAL | Status: DC | PRN

## 2015-06-27 MED ORDER — ALBUTEROL SULFATE HFA 108 (90 BASE) MCG/ACT IN AERS: INHALATION_SPRAY | RESPIRATORY_TRACT | Status: DC

## 2015-06-27 NOTE — Assessment & Plan Note (Signed)
Proair prn, Singulair

## 2015-06-27 NOTE — Assessment & Plan Note (Signed)
Percocet prn  Potential benefits of a long term opioids use as well as potential risks (i.e. addiction risk, apnea etc) and complications (i.e. Somnolence, constipation and others) were explained to the patient and were aknowledged. Naproxen prn  Potential benefits of a long term NSAID use as well as potential risks  and complications were explained to the patient and were aknowledged.

## 2015-06-27 NOTE — Assessment & Plan Note (Signed)
Diovan HCT, Furosemide prn, KCl

## 2015-06-27 NOTE — Telephone Encounter (Signed)
Allergy Serum Extract Date Mixed: 06/23/15  KCW aware Vial: 1 Strength: 1:10 Here/Mail/Pick Up: here Mixed By: tbs Last OV: 08/04/14 Pending OV: 08/28/15

## 2015-06-27 NOTE — Assessment & Plan Note (Signed)
F/u w/oncology 

## 2015-06-27 NOTE — Progress Notes (Signed)
Subjective:  Patient ID: [name], female    DOB: [birth year]  Age: 65 y.o. MRN: [id number]  CC: No chief complaint on file.   HPI [name] presents for asthma, OA, colon ca, asthma f/u. C/o occ LLQ pain - lifting groceries...  Outpatient Prescriptions Prior to Visit  Medication Sig Dispense Refill  . albuterol (PROAIR HFA) 108 (90 Base) MCG/ACT inhaler INHALE 2 PUFFS INTO THE LUNGS BY MOUTH EVERY 6 HOURS AS NEEDED FOR WHEEZING/SHORTNESS OF BREATH 25.5 each 11  . albuterol (PROVENTIL) (2.5 MG/3ML) 0.083% nebulizer solution Take 3 mLs (2.5 mg total) by nebulization every 6 (six) hours as needed for wheezing or shortness of breath. 75 mL 12  . aspirin 81 MG tablet Take 81 mg by mouth daily.     Marland Kitchen azithromycin (ZITHROMAX) 250 MG tablet TAKE AS DIRECTED 6 each 0  . cetirizine (ZYRTEC) 10 MG tablet Take 1 tablet (10 mg total) by mouth daily. 100 tablet 3  . Cholecalciferol (VITAMIN D) 1000 UNITS capsule Take 1,000 Units by mouth every morning.     . Cyanocobalamin (VITAMIN B-12) 1000 MCG SUBL Place 1 tablet (1,000 mcg total) under the tongue daily. 100 tablet 3  . ferrous sulfate 325 (65 FE) MG tablet Take 325 mg by mouth 3 (three) times daily with meals.     . Fluticasone Furoate-Vilanterol (BREO ELLIPTA) 100-25 MCG/INH AEPB Inhale 1 puff, then rinse mouth, once daily 60 each 12  . furosemide (LASIX) 20 MG tablet Take 20-40 mg by mouth daily as needed for fluid.    Marland Kitchen HYDROcodone-acetaminophen (NORCO/VICODIN) 5-325 MG tablet Take 1 tablet by mouth every 6 (six) hours as needed. 8 tablet 0  . methocarbamol (ROBAXIN) 500 MG tablet Take 1 tablet (500 mg total) by mouth every 6 (six) hours as needed for muscle spasms. 30 tablet 0  . mometasone (NASONEX) 50 MCG/ACT nasal spray Place 2 sprays into the nose daily as needed (allergies). 17 g 11  . montelukast (SINGULAIR) 10 MG tablet Take 1 tablet (10 mg total) by mouth at bedtime. 90 tablet 3  . naproxen (NAPROSYN) 500 MG tablet  Take 1 tablet (500 mg total) by mouth 2 (two) times daily with a meal. 30 tablet 2  . NONFORMULARY OR COMPOUNDED ITEM Inject 1 Applicatorful as directed once a week. Allergy Vaccine 1:10 Given at [facility]    . omeprazole (PRILOSEC) 20 MG capsule Take 20 mg by mouth daily.    Marland Kitchen oxyCODONE-acetaminophen (PERCOCET/ROXICET) 5-325 MG tablet Take 1-2 tablets by mouth every 4 (four) hours as needed for moderate pain. 30 tablet 0  . potassium chloride (K-DUR) 10 MEQ tablet Take 1 tablet (10 mEq total) by mouth daily. 90 tablet 3  . predniSONE (DELTASONE) 20 MG tablet Take 1 tablet (20 mg total) by mouth daily with breakfast. 4 tablet 0  . ranitidine (ZANTAC) 150 MG tablet Take 1 tablet (150 mg total) by mouth at bedtime. 30 tablet 5  . valsartan-hydrochlorothiazide (DIOVAN-HCT) 160-25 MG tablet Take 1 tablet by mouth daily. 90 tablet 3   No facility-administered medications prior to visit.    ROS Review of Systems  Constitutional: Negative for chills, activity change, appetite change, fatigue and unexpected weight change.  HENT: Negative for congestion, mouth sores and sinus pressure.   Eyes: Negative for visual disturbance.  Respiratory: Negative for cough and chest tightness.   Gastrointestinal: Positive for abdominal pain. Negative for nausea.  Genitourinary: Negative for frequency, difficulty urinating and vaginal pain.  Musculoskeletal: Positive  for arthralgias. Negative for back pain and gait problem.  Skin: Negative for pallor and rash.  Neurological: Negative for dizziness, tremors, weakness, numbness and headaches.  Psychiatric/Behavioral: Negative for confusion and sleep disturbance. The patient is not nervous/anxious.     Objective:  BP 128/90 mmHg  Pulse 76  Wt 225 lb (102.059 kg)  SpO2 96%  BP Readings from Last 3 Encounters:  06/27/15 128/90  04/11/15 135/68  03/14/15 121/78    Wt Readings from Last 3 Encounters:  06/27/15 225 lb (102.059 kg)  04/11/15 227 lb  (102.967 kg)  03/09/15 224 lb 8 oz (101.833 kg)    Physical Exam  Constitutional: She appears well-developed. No distress.  HENT:  Head: Normocephalic.  Right Ear: External ear normal.  Left Ear: External ear normal.  Nose: Nose normal.  Mouth/Throat: Oropharynx is clear and moist.  Eyes: Conjunctivae are normal. Pupils are equal, round, and reactive to light. Right eye exhibits no discharge. Left eye exhibits no discharge.  Neck: Normal range of motion. Neck supple. No JVD present. No tracheal deviation present. No thyromegaly present.  Cardiovascular: Normal rate, regular rhythm and normal heart sounds.   Pulmonary/Chest: No stridor. No respiratory distress. She has no wheezes.  Abdominal: Soft. Bowel sounds are normal. She exhibits no distension and no mass. There is no tenderness. There is no rebound and no guarding.  Musculoskeletal: She exhibits no edema or tenderness.  Lymphadenopathy:    She has no cervical adenopathy.  Neurological: She displays normal reflexes. No cranial nerve deficit. She exhibits normal muscle tone. Coordination normal.  Skin: No rash noted. No erythema.  Psychiatric: She has a normal mood and affect. Her behavior is normal. Judgment and thought content normal.  Obese  Lab Results  Component Value Date   WBC 4.4 04/11/2015   HGB 11.6 04/11/2015   HCT 36.1 04/11/2015   PLT 162 04/11/2015   GLUCOSE 106* 03/08/2015   CHOL 144 07/14/2012   TRIG 59.0 07/14/2012   HDL 76.60 07/14/2012   LDLCALC 56 07/14/2012   ALT 18 02/03/2015   AST 20 02/03/2015   NA 143 03/08/2015   K 4.0 03/08/2015   CL 108 03/08/2015   CREATININE 1.03* 03/14/2015   BUN 6 03/08/2015   CO2 29 03/08/2015   TSH 1.91 01/27/2013   INR 1.07 02/02/2013   HGBA1C 5.5 09/20/2013    No results found.  Assessment & Plan:   There are no diagnoses linked to this encounter. I am having [name] maintain her Vitamin D, aspirin, ferrous sulfate, furosemide, omeprazole, NONFORMULARY  OR COMPOUNDED ITEM, HYDROcodone-acetaminophen, cetirizine, Vitamin B-12, potassium chloride, valsartan-hydrochlorothiazide, fluticasone furoate-vilanterol, albuterol, albuterol, mometasone, montelukast, methocarbamol, oxyCODONE-acetaminophen, ranitidine, naproxen, predniSONE, and azithromycin.  No orders of the defined types were placed in this encounter.     Follow-up: No Follow-up on file.  Walker Kehr, MD

## 2015-06-27 NOTE — Progress Notes (Signed)
Pre visit review using our clinic review tool, if applicable. No additional management support is needed unless otherwise documented below in the visit note. 

## 2015-06-29 ENCOUNTER — Telehealth: Payer: Self-pay | Admitting: Internal Medicine

## 2015-06-29 DIAGNOSIS — G4733 Obstructive sleep apnea (adult) (pediatric): Secondary | ICD-10-CM

## 2015-06-29 NOTE — Telephone Encounter (Signed)
Spoke with pt. She needs an order sent to [facility] for a new CPAP machine. Order has been placed. Nothing further was needed.

## 2015-07-04 ENCOUNTER — Other Ambulatory Visit (HOSPITAL_BASED_OUTPATIENT_CLINIC_OR_DEPARTMENT_OTHER): Payer: Medicare Other

## 2015-07-04 ENCOUNTER — Ambulatory Visit (HOSPITAL_BASED_OUTPATIENT_CLINIC_OR_DEPARTMENT_OTHER): Payer: Medicare Other | Admitting: Oncology

## 2015-07-04 ENCOUNTER — Ambulatory Visit (INDEPENDENT_AMBULATORY_CARE_PROVIDER_SITE_OTHER): Payer: Medicare Other | Admitting: *Deleted

## 2015-07-04 ENCOUNTER — Telehealth: Payer: Self-pay | Admitting: Oncology

## 2015-07-04 VITALS — BP 123/69 | HR 69 | Temp 98.1°F | Resp 18 | Wt 221.5 lb

## 2015-07-04 DIAGNOSIS — J309 Allergic rhinitis, unspecified: Secondary | ICD-10-CM | POA: Diagnosis not present

## 2015-07-04 DIAGNOSIS — C184 Malignant neoplasm of transverse colon: Secondary | ICD-10-CM | POA: Diagnosis not present

## 2015-07-04 DIAGNOSIS — R799 Abnormal finding of blood chemistry, unspecified: Secondary | ICD-10-CM | POA: Diagnosis not present

## 2015-07-04 DIAGNOSIS — C185 Malignant neoplasm of splenic flexure: Secondary | ICD-10-CM

## 2015-07-04 LAB — CBC WITH DIFFERENTIAL/PLATELET
BASO%: 0.5 % (ref 0.0–2.0)
BASOS ABS: 0 10*3/uL (ref 0.0–0.1)
EOS ABS: 0.2 10*3/uL (ref 0.0–0.5)
EOS%: 2.5 % (ref 0.0–7.0)
HCT: 40.3 % (ref 34.8–46.6)
HEMOGLOBIN: 13.1 g/dL (ref 11.6–15.9)
LYMPH%: 27.3 % (ref 14.0–49.7)
MCH: 24.3 pg — ABNORMAL LOW (ref 25.1–34.0)
MCHC: 32.5 g/dL (ref 31.5–36.0)
MCV: 74.6 fL — AB (ref 79.5–101.0)
MONO#: 0.6 10*3/uL (ref 0.1–0.9)
MONO%: 10 % (ref 0.0–14.0)
NEUT%: 59.7 % (ref 38.4–76.8)
NEUTROS ABS: 3.6 10*3/uL (ref 1.5–6.5)
PLATELETS: 223 10*3/uL (ref 145–400)
RBC: 5.4 10*6/uL (ref 3.70–5.45)
RDW: 16.7 % — ABNORMAL HIGH (ref 11.2–14.5)
WBC: 6 10*3/uL (ref 3.9–10.3)
lymph#: 1.6 10*3/uL (ref 0.9–3.3)

## 2015-07-04 LAB — CHCC SMEAR

## 2015-07-04 LAB — FERRITIN: Ferritin: 109 ng/ml (ref 9–269)

## 2015-07-04 NOTE — Telephone Encounter (Signed)
per pof to sch pt appt-gave pt copy of avs °

## 2015-07-04 NOTE — Progress Notes (Signed)
  [place] OFFICE PROGRESS NOTE   Diagnosis: Colon cancer  INTERVAL HISTORY:   Ms. Dowlen returns as scheduled. She feels well. Good appetite. No pain. No difficulty with bowel function.  Objective:  Vital signs in last 24 hours:  Blood pressure 123/69, pulse 69, temperature 98.1 F (36.7 C), temperature source Oral, resp. rate 18, weight 221 lb 8 oz (100.472 kg), SpO2 98 %.    HEENT: Neck without mass Lymphatics: No cervical, supraclavicular, axillary, or inguinal nodes Resp: Lungs clear bilaterally Cardio: Regular rate and rhythm GI: No hepatosplenomegaly, no mass, nontender Vascular: No leg edema  Lab Results:  Lab Results  Component Value Date   WBC 6.0 07/04/2015   HGB 13.1 07/04/2015   HCT 40.3 07/04/2015   MCV 74.6* 07/04/2015   PLT 223 07/04/2015   NEUTROABS 3.6 07/04/2015    Lab Results  Component Value Date   CEA1 1.3 04/11/2015     Medications: I have reviewed the patient's current medications.  Assessment/Plan: 1. 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   Presentation to the emergency room 02/02/2013 with a colonic obstruction secondary to tumor at the splenic flexure.   Baseline CEA on 02/02/2013 less than 0.5.   Initiation of adjuvant Xeloda 04/10/2013.   Cycle 2 adjuvant Xeloda 05/01/2013.   Cycle 3 adjuvant Xeloda 05/22/2013.   Cycle 4 adjuvant Xeloda 06/12/2013.   Cycle 5 adjuvant Xeloda 07/03/2013 (Xeloda dose reduced due to hand foot syndrome).   Cycle 6 adjuvant Xeloda 07/24/2013.   Cycle 7 adjuvant Xeloda 08/14/2013   Cycle 8 adjuvant Xeloda 09/07/2013.  2. History of iron deficiency anemia. Recurrent anemia 02/01/2014, improved,  Persistent red cell microcytosis 3. Asthma. 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 5. Status post ostomy reversal 11/25/2013. 6. CT 02/01/2014 with no evidence of local tumor recurrence or metastatic disease. New area of masslike thickening and small bowel dilatation at the mid small bowel  Status post deep enteroscopy at [facility] on 03/16/2014, negative.  Status post capsule endoscopy 03/22/2014, confirmed a small bowel tumor  Exploratory laparotomy with resection of a small bowel mass on 04/06/2014 with the pathology confirming invasive adenocarcinoma extending through small bowel wall and involving adjacent loops of adherent small bowel, 2 of 12 lymph nodes positive. History consistent with recurrent colon cancer. Loss of MLH1 and PMS 2, MSI high as was the January 2015 tumor   Disposition:  Ms. Ingber appears stable. No evidence of progressive colon cancer. We will follow-up on the CEA today. She has persistent red cell microcytosis. She may have a thalassemia variant. We will follow-up on the ferritin level from today.  Ms. Allmon will return for an office visit and CEA in 4 months.  Betsy Coder, MD  07/04/2015  11:33 AM

## 2015-07-05 LAB — CEA (PARALLEL TESTING): CEA: <0.5 ng/mL

## 2015-07-05 LAB — CEA: CEA1: 1.2 ng/mL (ref 0.0–4.7)

## 2015-07-12 ENCOUNTER — Ambulatory Visit (INDEPENDENT_AMBULATORY_CARE_PROVIDER_SITE_OTHER): Payer: Medicare Other | Admitting: *Deleted

## 2015-07-12 DIAGNOSIS — J309 Allergic rhinitis, unspecified: Secondary | ICD-10-CM

## 2015-07-19 ENCOUNTER — Ambulatory Visit (INDEPENDENT_AMBULATORY_CARE_PROVIDER_SITE_OTHER): Payer: Medicare Other | Admitting: *Deleted

## 2015-07-19 DIAGNOSIS — J309 Allergic rhinitis, unspecified: Secondary | ICD-10-CM

## 2015-07-26 ENCOUNTER — Ambulatory Visit (INDEPENDENT_AMBULATORY_CARE_PROVIDER_SITE_OTHER): Payer: Medicare Other

## 2015-07-26 DIAGNOSIS — J309 Allergic rhinitis, unspecified: Secondary | ICD-10-CM

## 2015-08-03 ENCOUNTER — Telehealth: Payer: Self-pay | Admitting: Emergency Medicine

## 2015-08-03 MED ORDER — OXYCODONE-ACETAMINOPHEN 5-325 MG PO TABS: 1.0000 | ORAL_TABLET | ORAL | Status: DC | PRN

## 2015-08-03 NOTE — Telephone Encounter (Signed)
Patient called and wanted to know if she can have a refill on oxyCODONE-acetaminophen (PERCOCET/ROXICET) 5-325 MG tablet. Please give patient a call back. Thanks.

## 2015-08-03 NOTE — Telephone Encounter (Signed)
Done hardcopy to [name]  

## 2015-08-03 NOTE — Telephone Encounter (Signed)
Last OV with PCP was 06/27/15. Next OV is scheduled 10/2015. Please advise ok to Rf in PCP's absence? Thanks!

## 2015-08-03 NOTE — Telephone Encounter (Signed)
Patient aware. Placed up front for pick up

## 2015-08-04 ENCOUNTER — Ambulatory Visit: Payer: Medicare Other | Admitting: Internal Medicine

## 2015-08-09 ENCOUNTER — Ambulatory Visit: Payer: Medicare Other

## 2015-08-10 ENCOUNTER — Ambulatory Visit (INDEPENDENT_AMBULATORY_CARE_PROVIDER_SITE_OTHER): Payer: Medicare Other | Admitting: *Deleted

## 2015-08-10 DIAGNOSIS — J309 Allergic rhinitis, unspecified: Secondary | ICD-10-CM | POA: Diagnosis not present

## 2015-08-11 ENCOUNTER — Telehealth: Payer: Self-pay | Admitting: Internal Medicine

## 2015-08-14 NOTE — Telephone Encounter (Signed)
Katie,  Please advise where we can add this pt.  thanks

## 2015-08-16 ENCOUNTER — Ambulatory Visit (INDEPENDENT_AMBULATORY_CARE_PROVIDER_SITE_OTHER): Payer: Medicare Other | Admitting: *Deleted

## 2015-08-16 DIAGNOSIS — J309 Allergic rhinitis, unspecified: Secondary | ICD-10-CM

## 2015-08-16 NOTE — Telephone Encounter (Signed)
Pt can be seen 09/14/15 at 11:!5am slot for 30 minute OV. Thanks.

## 2015-08-16 NOTE — Telephone Encounter (Signed)
lmomtcb x1 

## 2015-08-16 NOTE — Telephone Encounter (Signed)
Spoke with the pt and scheduled appt for 09/14/15 at 11:15 am  Nothing further needed

## 2015-08-22 ENCOUNTER — Ambulatory Visit (INDEPENDENT_AMBULATORY_CARE_PROVIDER_SITE_OTHER): Payer: Medicare Other | Admitting: *Deleted

## 2015-08-22 DIAGNOSIS — J309 Allergic rhinitis, unspecified: Secondary | ICD-10-CM

## 2015-08-23 ENCOUNTER — Ambulatory Visit: Payer: Medicare Other

## 2015-08-28 ENCOUNTER — Ambulatory Visit: Payer: Medicare Other | Admitting: Internal Medicine

## 2015-08-29 ENCOUNTER — Ambulatory Visit (INDEPENDENT_AMBULATORY_CARE_PROVIDER_SITE_OTHER): Payer: Medicare Other | Admitting: *Deleted

## 2015-08-29 DIAGNOSIS — J309 Allergic rhinitis, unspecified: Secondary | ICD-10-CM

## 2015-09-06 ENCOUNTER — Ambulatory Visit (INDEPENDENT_AMBULATORY_CARE_PROVIDER_SITE_OTHER): Payer: Medicare Other | Admitting: *Deleted

## 2015-09-06 DIAGNOSIS — J309 Allergic rhinitis, unspecified: Secondary | ICD-10-CM | POA: Diagnosis not present

## 2015-09-13 ENCOUNTER — Encounter: Payer: Self-pay | Admitting: Internal Medicine

## 2015-09-14 ENCOUNTER — Ambulatory Visit (INDEPENDENT_AMBULATORY_CARE_PROVIDER_SITE_OTHER): Payer: Medicare Other | Admitting: *Deleted

## 2015-09-14 ENCOUNTER — Encounter: Payer: Self-pay | Admitting: Internal Medicine

## 2015-09-14 ENCOUNTER — Ambulatory Visit (INDEPENDENT_AMBULATORY_CARE_PROVIDER_SITE_OTHER): Payer: Medicare Other | Admitting: Internal Medicine

## 2015-09-14 DIAGNOSIS — J309 Allergic rhinitis, unspecified: Secondary | ICD-10-CM

## 2015-09-14 DIAGNOSIS — J4520 Mild intermittent asthma, uncomplicated: Secondary | ICD-10-CM

## 2015-09-14 DIAGNOSIS — J3089 Other allergic rhinitis: Secondary | ICD-10-CM

## 2015-09-14 DIAGNOSIS — J302 Other seasonal allergic rhinitis: Secondary | ICD-10-CM

## 2015-09-14 NOTE — Progress Notes (Signed)
Subjective:    Patient ID: [name], female    DOB: 1950-05-06, 66 y.o.   MRN: [id number]  HPI F never smoker followed for asthma, allergic rhinitis,, OSA, obesity/hypoventilation, complicated by GERD, arthritis, HBP   08/04/14-64 yoF never smoker followed for asthma, allergic rhinitis,, OSA, obesity/hypoventilation, complicated by GERD, arthritis, HBP, Colon Ca/ L hemicolectomy Allergy Vaccine 1:10 GH CPAP 9/Advanced FOLLOWS FOR:No DL at this time-will need to place order for one-DME is [facility]. CPAP 9/ Advanced for 6-7 hours each night. Feeling rested. Leak in CPAP hose. Continues allergy vaccine 1:10 GH-doing well.  Lies down to nap but doesn't sleep. Long sleep latency  09/14/2015-65 year old female never smoker followed for asthma, allergic rhinitis, OSA, obesity/hypoventilation, complicated by GERD, arthritis, HBP, colon cancer/left hemicolectomy Allergy Vaccine 1:10 GH CPAP 9/Advanced FOLLOWS FOR: DME: [facility]:pt wears CPAP nightly;DL attached. Continues allergy injections weekly and denies any issues at this time. Download shows excellent compliance and control, 100% over 4 hours per night with AHI 0.8/hour. She says she definitely sleeps better and feels better with CPAP. Denies any recent nasal congestion or drainage, wheeze or cough. She has not used her rescue inhaler in several months.  ROS-see HPI Constitutional:   No-   weight loss, night sweats, fevers, chills, fatigue, lassitude. HEENT:   No-  headaches, difficulty swallowing, tooth/dental problems, sore throat,       No-  sneezing, itching, ear ache, nasal congestion, post nasal drip,  CV:  No-   chest pain, orthopnea, PND, swelling in lower extremities, anasarca, dizziness, palpitations Resp: No- acute  shortness of breath with exertion or at rest.              No-   productive cough,  non-productive cough,  No- coughing up of blood.              No-   change in color of mucus.  No-sustained wheezing.   Skin: No-    rash or lesions. GI:  + heartburn, indigestion, no-abdominal pain, nausea, vomiting,  GU: No  complaint MS:  No-   joint pain or swelling.   Neuro-     nothing unusual Psych:  No- change in mood or affect. No depression or anxiety.  No memory loss.  OBJ- Physical Exam   similar to previous exams General- Alert, Oriented, Affect-appropriate, Distress- none acute, +obese Skin- rash-none, lesions- none, excoriation- none Lymphadenopathy- none Head- atraumatic            Eyes- Gross vision intact, PERRLA, conjunctivae and secretions clear            Ears- Hearing, canals-normal            Nose- Clear, no-Septal dev, mucus, polyps, erosion, perforation             Throat- Mallampati II , mucosa clear,not dry , drainage- none, tonsils- atrophic, + hoarse Neck- flexible , trachea midline, no stridor , thyroid nl, carotid no bruit Chest - symmetrical excursion , unlabored           Heart/CV- RRR , no murmur , no gallop  , no rub, nl s1 s2                           - JVD- none , edema- none, stasis changes- none, varices- none           Lung- clear to P&A, wheeze- none, cough-none , dullness-none, rub- none  Chest wall-  Abd-  Br/ Gen/ Rectal- Not done, not indicated Extrem- cyanosis- none, clubbing, none, atrophy- none, strength- nl Neuro- grossly intact to observation

## 2015-09-14 NOTE — Patient Instructions (Signed)
We can continue present meds  Ok to continue DME Advanced, cpap 9, mask of choice, humidifier, supplies, AirView for dx OSA  As discussed. We can continue allergy vaccine here as long as the clinic is open. When we have to stop providing allergy vaccine, I suggest that you wait and see how you do, using antihistamines and flonase nasal spray if needed. If your allergy and asthma problems need more help, then talk with Dr Alain Marion about maybe going to one of the other allergy practices in town.

## 2015-09-16 NOTE — Assessment & Plan Note (Signed)
Doing very well this summer with no recent rescue inhaler or sleep disturbance.

## 2015-09-16 NOTE — Assessment & Plan Note (Signed)
She has been on allergy vaccine for several years. I explained our plan to close the allergy clinic this winter and suggested she stop allergy shots, then went to see how she does. She can transfer to another allergist if needed.

## 2015-09-21 ENCOUNTER — Ambulatory Visit (INDEPENDENT_AMBULATORY_CARE_PROVIDER_SITE_OTHER): Payer: Medicare Other | Admitting: *Deleted

## 2015-09-21 DIAGNOSIS — J309 Allergic rhinitis, unspecified: Secondary | ICD-10-CM | POA: Diagnosis not present

## 2015-09-25 ENCOUNTER — Telehealth: Payer: Self-pay | Admitting: Emergency Medicine

## 2015-09-25 ENCOUNTER — Telehealth: Payer: Self-pay | Admitting: Internal Medicine

## 2015-09-25 MED ORDER — ALBUTEROL SULFATE (2.5 MG/3ML) 0.083% IN NEBU: 2.5000 mg | INHALATION_SOLUTION | Freq: Four times a day (QID) | RESPIRATORY_TRACT | 12 refills | Status: DC | PRN

## 2015-09-25 MED ORDER — FLUTICASONE FUROATE-VILANTEROL 100-25 MCG/INH IN AEPB: INHALATION_SPRAY | RESPIRATORY_TRACT | 12 refills | Status: DC

## 2015-09-25 NOTE — Telephone Encounter (Signed)
Per SN:  Call pharmacy to be sure she is taking all her medication as prescribed, if she is then she needs to come in for an appt.    Called pharmacy and spoke to pharmacy tech and was advised pt has not filled Breo since 03/31/2015 and filled her albuterol hfa on 5/30/217. Rx for Breo sent to preferred pharmacy and advised pt to take this medication every day no matter what as this will help. Pt verbalized understanding and denied any further questions or concerns at this time.

## 2015-09-25 NOTE — Telephone Encounter (Signed)
Called pt back and informed her. Thanks.

## 2015-09-25 NOTE — Telephone Encounter (Signed)
If it is a post-op pain - the pt needs to check wthe surgical office. Go to the ER if the pain is severe. Thx

## 2015-09-25 NOTE — Telephone Encounter (Signed)
Pt called and stated she is still in pain from her Hernia surgery in Feb. She is still in pain and asked if you could prescribe her something for pain. I stated and advised she needed to make an appointment to be seen but she requested I ask first. Please advise thanks.

## 2015-09-25 NOTE — Telephone Encounter (Signed)
Called and spoke to pt. Pt c/o increase in SOB, nonprod cough - worse at night, and runny nose x 2-3 days. Pt denies CP/tightness, f/c/s, and wheezing. Pt also states she is out of albuterol neb, rx sent to preferred pharmacy. Pt verbalized understanding. CY is out of office, will send to Dr. Lenna Gilford.   Dr. Lenna Gilford, please advise. Thanks.   No Known Allergies  Current Outpatient Prescriptions on File Prior to Visit  Medication Sig Dispense Refill  . albuterol (PROAIR HFA) 108 (90 Base) MCG/ACT inhaler INHALE 2 PUFFS INTO THE LUNGS BY MOUTH EVERY 6 HOURS AS NEEDED FOR WHEEZING/SHORTNESS OF BREATH 25.5 each 11  . aspirin 81 MG tablet Take 81 mg by mouth daily.     . cetirizine (ZYRTEC) 10 MG tablet Take 1 tablet (10 mg total) by mouth daily. 100 tablet 3  . Cholecalciferol (VITAMIN D) 1000 UNITS capsule Take 1,000 Units by mouth every morning.     . Cyanocobalamin (VITAMIN B-12) 1000 MCG SUBL Place 1 tablet (1,000 mcg total) under the tongue daily. 100 tablet 3  . ferrous sulfate 325 (65 FE) MG tablet Take 325 mg by mouth 3 (three) times daily with meals.     . Fluticasone Furoate-Vilanterol (BREO ELLIPTA) 100-25 MCG/INH AEPB Inhale 1 puff, then rinse mouth, once daily 60 each 12  . furosemide (LASIX) 20 MG tablet Take 20-40 mg by mouth daily as needed for fluid.    . mometasone (NASONEX) 50 MCG/ACT nasal spray Place 2 sprays into the nose daily as needed (allergies). 17 g 11  . montelukast (SINGULAIR) 10 MG tablet Take 1 tablet (10 mg total) by mouth at bedtime. 90 tablet 3  . naproxen (NAPROSYN) 500 MG tablet Take 1 tablet (500 mg total) by mouth 2 (two) times daily as needed. 60 tablet 1  . NONFORMULARY OR COMPOUNDED ITEM Inject 1 Applicatorful as directed once a week. Allergy Vaccine 1:10 Given at [facility]    . omeprazole (PRILOSEC) 20 MG capsule Take 20 mg by mouth daily.    Marland Kitchen oxyCODONE-acetaminophen (PERCOCET/ROXICET) 5-325 MG tablet Take 1-2 tablets by mouth every 4 (four) hours as  needed for moderate pain. 30 tablet 0  . potassium chloride (K-DUR) 10 MEQ tablet Take 1 tablet (10 mEq total) by mouth daily. 90 tablet 3  . ranitidine (ZANTAC) 150 MG tablet Take 1 tablet (150 mg total) by mouth at bedtime. 30 tablet 5  . valsartan-hydrochlorothiazide (DIOVAN-HCT) 160-25 MG tablet Take 1 tablet by mouth daily. 90 tablet 3   No current facility-administered medications on file prior to visit.

## 2015-09-27 ENCOUNTER — Ambulatory Visit (INDEPENDENT_AMBULATORY_CARE_PROVIDER_SITE_OTHER): Payer: Medicare Other | Admitting: *Deleted

## 2015-09-27 ENCOUNTER — Telehealth: Payer: Self-pay | Admitting: Internal Medicine

## 2015-09-27 DIAGNOSIS — J309 Allergic rhinitis, unspecified: Secondary | ICD-10-CM

## 2015-09-27 MED ORDER — HYDROCODONE-HOMATROPINE 5-1.5 MG/5ML PO SYRP: 5.0000 mL | ORAL_SOLUTION | Freq: Four times a day (QID) | ORAL | 0 refills | Status: DC | PRN

## 2015-09-27 NOTE — Telephone Encounter (Signed)
Per SN--  Ok to fill the hycodan  6 oz   1 tsp every 6 hours as needed for cough.  No refills thanks

## 2015-09-27 NOTE — Telephone Encounter (Signed)
Rx printed, signed. Pt was given RX in hand. Nothing further needed

## 2015-09-27 NOTE — Telephone Encounter (Signed)
Spoke with pt in the lobby. She is complaining of a dry cough for 4 days. Denies chest tightness, wheezing, SOB or fever. Has not tried any OTC medications. Would like a prescription for a cough syrup. The last cough syrup given to her was Hycodan.  No Known Allergies  SN - please advise. Thanks.

## 2015-10-04 ENCOUNTER — Ambulatory Visit (INDEPENDENT_AMBULATORY_CARE_PROVIDER_SITE_OTHER): Payer: Medicare Other | Admitting: *Deleted

## 2015-10-04 DIAGNOSIS — J309 Allergic rhinitis, unspecified: Secondary | ICD-10-CM | POA: Diagnosis not present

## 2015-10-12 ENCOUNTER — Ambulatory Visit (INDEPENDENT_AMBULATORY_CARE_PROVIDER_SITE_OTHER): Payer: Medicare Other | Admitting: *Deleted

## 2015-10-12 DIAGNOSIS — J309 Allergic rhinitis, unspecified: Secondary | ICD-10-CM

## 2015-10-12 NOTE — Progress Notes (Signed)
Please Delete [id number], created in error.

## 2015-10-19 ENCOUNTER — Ambulatory Visit (INDEPENDENT_AMBULATORY_CARE_PROVIDER_SITE_OTHER): Payer: Medicare Other | Admitting: *Deleted

## 2015-10-19 DIAGNOSIS — J309 Allergic rhinitis, unspecified: Secondary | ICD-10-CM | POA: Diagnosis not present

## 2015-10-24 ENCOUNTER — Ambulatory Visit (INDEPENDENT_AMBULATORY_CARE_PROVIDER_SITE_OTHER): Payer: Medicare Other | Admitting: *Deleted

## 2015-10-24 DIAGNOSIS — J309 Allergic rhinitis, unspecified: Secondary | ICD-10-CM | POA: Diagnosis not present

## 2015-10-27 ENCOUNTER — Telehealth: Payer: Self-pay | Admitting: Internal Medicine

## 2015-10-27 DIAGNOSIS — J309 Allergic rhinitis, unspecified: Secondary | ICD-10-CM | POA: Diagnosis not present

## 2015-10-27 NOTE — Telephone Encounter (Signed)
Allergy Serum Extract Date Mixed: 10/27/15 Vial: 1 Strength: 1:10 Here/Mail/Pick Up: here Mixed By: tbs Last OV: 09/14/15 Pending OV: N/A

## 2015-11-01 ENCOUNTER — Ambulatory Visit (INDEPENDENT_AMBULATORY_CARE_PROVIDER_SITE_OTHER): Payer: Medicare Other | Admitting: Internal Medicine

## 2015-11-01 ENCOUNTER — Ambulatory Visit (INDEPENDENT_AMBULATORY_CARE_PROVIDER_SITE_OTHER)
Admission: RE | Admit: 2015-11-01 | Discharge: 2015-11-01 | Disposition: A | Discharge status: Home / Self Care | Payer: Medicare Other | Source: Ambulatory Visit | Attending: Internal Medicine | Admitting: Internal Medicine

## 2015-11-01 ENCOUNTER — Encounter: Payer: Self-pay | Admitting: Internal Medicine

## 2015-11-01 ENCOUNTER — Ambulatory Visit (INDEPENDENT_AMBULATORY_CARE_PROVIDER_SITE_OTHER): Payer: Medicare Other | Admitting: *Deleted

## 2015-11-01 DIAGNOSIS — M25519 Pain in unspecified shoulder: Secondary | ICD-10-CM | POA: Insufficient documentation

## 2015-11-01 DIAGNOSIS — S161XXA Strain of muscle, fascia and tendon at neck level, initial encounter: Secondary | ICD-10-CM

## 2015-11-01 DIAGNOSIS — J309 Allergic rhinitis, unspecified: Secondary | ICD-10-CM

## 2015-11-01 DIAGNOSIS — M25511 Pain in right shoulder: Secondary | ICD-10-CM

## 2015-11-01 DIAGNOSIS — M542 Cervicalgia: Secondary | ICD-10-CM | POA: Diagnosis not present

## 2015-11-01 MED ORDER — HYDROCODONE-HOMATROPINE 5-1.5 MG/5ML PO SYRP: 5.0000 mL | ORAL_SOLUTION | Freq: Four times a day (QID) | ORAL | 0 refills | Status: DC | PRN

## 2015-11-01 MED ORDER — METHOCARBAMOL 500 MG PO TABS: 500.0000 mg | ORAL_TABLET | Freq: Three times a day (TID) | ORAL | 1 refills | Status: DC | PRN

## 2015-11-01 MED ORDER — RANITIDINE HCL 150 MG PO TABS: 150.0000 mg | ORAL_TABLET | Freq: Every day | ORAL | 5 refills | Status: DC
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
## 2015-11-01 NOTE — Progress Notes (Signed)
Pre visit review using our clinic review tool, if applicable. No additional management support is needed unless otherwise documented below in the visit note. 
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
## 2015-11-07 ENCOUNTER — Other Ambulatory Visit (HOSPITAL_BASED_OUTPATIENT_CLINIC_OR_DEPARTMENT_OTHER): Payer: Medicare Other

## 2015-11-07 ENCOUNTER — Ambulatory Visit (INDEPENDENT_AMBULATORY_CARE_PROVIDER_SITE_OTHER): Payer: Medicare Other | Admitting: *Deleted

## 2015-11-07 ENCOUNTER — Ambulatory Visit (HOSPITAL_BASED_OUTPATIENT_CLINIC_OR_DEPARTMENT_OTHER): Payer: Medicare Other | Admitting: Nurse Practitioner

## 2015-11-07 ENCOUNTER — Telehealth: Payer: Self-pay | Admitting: Oncology

## 2015-11-07 VITALS — BP 122/73 | HR 56 | Temp 98.4°F | Resp 18 | Ht 62.0 in | Wt 226.6 lb

## 2015-11-07 DIAGNOSIS — C184 Malignant neoplasm of transverse colon: Secondary | ICD-10-CM

## 2015-11-07 DIAGNOSIS — J309 Allergic rhinitis, unspecified: Secondary | ICD-10-CM | POA: Diagnosis not present

## 2015-11-07 DIAGNOSIS — C179 Malignant neoplasm of small intestine, unspecified: Secondary | ICD-10-CM

## 2015-11-07 DIAGNOSIS — R718 Other abnormality of red blood cells: Secondary | ICD-10-CM | POA: Diagnosis not present

## 2015-11-07 LAB — CBC WITH DIFFERENTIAL/PLATELET
BASO%: 1 % (ref 0.0–2.0)
Basophils Absolute: 0.1 10*3/uL (ref 0.0–0.1)
EOS ABS: 0.1 10*3/uL (ref 0.0–0.5)
EOS%: 1.3 % (ref 0.0–7.0)
HCT: 37 % (ref 34.8–46.6)
HEMOGLOBIN: 11.7 g/dL (ref 11.6–15.9)
LYMPH%: 20.6 % (ref 14.0–49.7)
MCH: 23.8 pg — AB (ref 25.1–34.0)
MCHC: 31.6 g/dL (ref 31.5–36.0)
MCV: 75.4 fL — AB (ref 79.5–101.0)
MONO#: 0.6 10*3/uL (ref 0.1–0.9)
MONO%: 8.7 % (ref 0.0–14.0)
NEUT%: 68.4 % (ref 38.4–76.8)
NEUTROS ABS: 4.6 10*3/uL (ref 1.5–6.5)
Platelets: 252 10*3/uL (ref 145–400)
RBC: 4.91 10*6/uL (ref 3.70–5.45)
RDW: 16.5 % — AB (ref 11.2–14.5)
WBC: 6.7 10*3/uL (ref 3.9–10.3)
lymph#: 1.4 10*3/uL (ref 0.9–3.3)

## 2015-11-07 LAB — FERRITIN: Ferritin: 107 ng/ml (ref 9–269)

## 2015-11-07 LAB — CEA (IN HOUSE-CHCC): CEA (CHCC-In House): 1.44 ng/mL (ref 0.00–5.00)

## 2015-11-07 NOTE — Progress Notes (Signed)
  [facility] OFFICE PROGRESS NOTE   Diagnosis:  Colon cancer  INTERVAL HISTORY:   [name] returns as scheduled. She feels well. She has a good appetite. She is gaining weight. No abdominal pain. Bowels moving regularly. No bleeding.  Objective:  Vital signs in last 24 hours:  Blood pressure 122/73, pulse (!) 56, temperature 98.4 F (36.9 C), temperature source Oral, resp. rate 18, height '5\' 2"'$  (1.575 m), weight 226 lb 9.6 oz (102.8 kg), SpO2 100 %.    HEENT: No neck mass. Lymphatics: No palpable cervical, supraclavicular, axillary or inguinal lymph nodes. Resp: Lungs clear bilaterally. Cardio: Regular rate and rhythm. GI: Abdomen soft and nontender. No hepatomegaly. No mass. Vascular: No leg edema.   Lab Results:  Lab Results  Component Value Date   WBC 6.7 11/07/2015   HGB 11.7 11/07/2015   HCT 37.0 11/07/2015   MCV 75.4 (L) 11/07/2015   PLT 252 11/07/2015   NEUTROABS 4.6 11/07/2015  CEA pending  Imaging:  No results found.  Medications: I have reviewed the patient's current medications.  Assessment/Plan: 1. 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   Presentation to the emergency room 02/02/2013 with a colonic obstruction secondary to tumor at the splenic flexure.   Baseline CEA on 02/02/2013 less than 0.5.   Initiation of adjuvant Xeloda 04/10/2013.   Cycle 2 adjuvant Xeloda 05/01/2013.   Cycle 3 adjuvant Xeloda 05/22/2013.   Cycle 4 adjuvant Xeloda 06/12/2013.   Cycle 5 adjuvant Xeloda 07/03/2013 (Xeloda dose reduced due to hand foot syndrome).   Cycle 6 adjuvant Xeloda 07/24/2013.   Cycle 7 adjuvant Xeloda 08/14/2013   Cycle 8 adjuvant Xeloda 09/07/2013.   2. History of iron deficiency anemia. Recurrent anemia 02/01/2014, improved, Persistent red cell microcytosis 3. Asthma. 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 5. Status post ostomy reversal 11/25/2013. 6. CT 02/01/2014 with no evidence of local tumor recurrence or metastatic disease. New area of masslike thickening and small bowel dilatation at the mid small bowel  Status post deep enteroscopy at [facility] on 03/16/2014, negative.  Status post capsule endoscopy 03/22/2014, confirmed a small bowel tumor  Exploratory laparotomy with resection of a small bowel mass on 04/06/2014 with the pathology confirming invasive adenocarcinoma extending through small bowel wall and involving adjacent loops of adherent small bowel, 2 of 12 lymph nodes positive. History consistent with recurrent colon cancer. Loss of MLH1 and PMS 2, MSI high as was the January 2015 tumor   Disposition: [name] appears stable. There is no evidence of progressive colon cancer. We will follow-up on the CEA from today.  She has persistent red cell microcytosis. She may have a thalassemia variant. We will follow-up on the ferritin level from today. She continues oral iron.  She will return for a follow-up visit and CEA in 4 months. She will contact the office in the interim with any problems.  Plan reviewed with [name].    Ned Card ANP/GNP-BC   11/07/2015  11:09 AM

## 2015-11-07 NOTE — Telephone Encounter (Signed)
GAVE PATIENT AVS REPORT AND APPOINTMENTS FOR February. °

## 2015-11-08 ENCOUNTER — Telehealth: Payer: Self-pay | Admitting: *Deleted

## 2015-11-08 LAB — CEA: CEA1: 1.3 ng/mL (ref 0.0–4.7)

## 2015-11-08 NOTE — Telephone Encounter (Signed)
Pt notified of CEA result: Normal, per [name]. Pt voiced understanding.

## 2015-11-08 NOTE — Telephone Encounter (Signed)
-----   Message from Ladell Pier, MD sent at 11/08/2015  7:24 AM EDT ----- Please call patient, [name] is normal

## 2015-11-11 DIAGNOSIS — Z23 Encounter for immunization: Secondary | ICD-10-CM | POA: Diagnosis not present

## 2015-11-14 ENCOUNTER — Ambulatory Visit: Payer: Medicare Other

## 2015-11-15 ENCOUNTER — Ambulatory Visit (INDEPENDENT_AMBULATORY_CARE_PROVIDER_SITE_OTHER): Payer: Medicare Other | Admitting: *Deleted

## 2015-11-15 DIAGNOSIS — J309 Allergic rhinitis, unspecified: Secondary | ICD-10-CM | POA: Diagnosis not present

## 2015-11-22 ENCOUNTER — Ambulatory Visit (INDEPENDENT_AMBULATORY_CARE_PROVIDER_SITE_OTHER): Payer: Medicare Other | Admitting: *Deleted

## 2015-11-22 ENCOUNTER — Ambulatory Visit: Payer: Medicare Other | Attending: Internal Medicine | Admitting: Physical Therapy

## 2015-11-22 DIAGNOSIS — M6281 Muscle weakness (generalized): Secondary | ICD-10-CM | POA: Diagnosis not present

## 2015-11-22 DIAGNOSIS — J309 Allergic rhinitis, unspecified: Secondary | ICD-10-CM

## 2015-11-22 DIAGNOSIS — M25611 Stiffness of right shoulder, not elsewhere classified: Secondary | ICD-10-CM

## 2015-11-22 DIAGNOSIS — M25511 Pain in right shoulder: Secondary | ICD-10-CM

## 2015-11-22 DIAGNOSIS — M542 Cervicalgia: Secondary | ICD-10-CM | POA: Diagnosis not present

## 2015-11-23 NOTE — Addendum Note (Signed)
Addended by: Carney Living on: 11/23/2015 03:24 PM   Modules accepted: Orders
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
## 2015-11-28 ENCOUNTER — Ambulatory Visit: Payer: Medicare Other

## 2015-11-28 ENCOUNTER — Ambulatory Visit: Payer: Medicare Other | Admitting: Physical Therapy

## 2015-11-28 DIAGNOSIS — M542 Cervicalgia: Secondary | ICD-10-CM

## 2015-11-28 DIAGNOSIS — M6281 Muscle weakness (generalized): Secondary | ICD-10-CM | POA: Diagnosis not present

## 2015-11-28 DIAGNOSIS — M25611 Stiffness of right shoulder, not elsewhere classified: Secondary | ICD-10-CM

## 2015-11-28 DIAGNOSIS — M25511 Pain in right shoulder: Secondary | ICD-10-CM | POA: Diagnosis not present

## 2015-11-28 NOTE — Patient Instructions (Signed)
SHOULDER: External Rotation - Supine (Cane)   Hold cane with both hands. Rotate arm away from body. Keep elbow on floor and next to body. _10__ reps per set, 2___ sets per day, __7_ days per week Add towel to keep elbow at side.  CCane Exercise: Flexion   Lie on back, holding cane above chest. Keeping arms as straight as possible, lower cane toward floor beyond head. Hold __5__ seconds. Repeat __10__ times. Do ___2_ sessions per day.
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
## 2015-11-29 ENCOUNTER — Ambulatory Visit: Payer: Medicare Other | Attending: Internal Medicine | Admitting: Physical Therapy

## 2015-11-29 ENCOUNTER — Ambulatory Visit (INDEPENDENT_AMBULATORY_CARE_PROVIDER_SITE_OTHER): Payer: Medicare Other | Admitting: *Deleted

## 2015-11-29 DIAGNOSIS — M6281 Muscle weakness (generalized): Secondary | ICD-10-CM | POA: Insufficient documentation

## 2015-11-29 DIAGNOSIS — M25611 Stiffness of right shoulder, not elsewhere classified: Secondary | ICD-10-CM | POA: Insufficient documentation

## 2015-11-29 DIAGNOSIS — M25511 Pain in right shoulder: Secondary | ICD-10-CM | POA: Diagnosis not present

## 2015-11-29 DIAGNOSIS — J309 Allergic rhinitis, unspecified: Secondary | ICD-10-CM

## 2015-11-29 DIAGNOSIS — M542 Cervicalgia: Secondary | ICD-10-CM | POA: Insufficient documentation
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
## 2015-12-04 ENCOUNTER — Ambulatory Visit (INDEPENDENT_AMBULATORY_CARE_PROVIDER_SITE_OTHER): Payer: Medicare Other | Admitting: *Deleted

## 2015-12-04 ENCOUNTER — Ambulatory Visit: Payer: Medicare Other | Admitting: Physical Therapy

## 2015-12-04 DIAGNOSIS — M542 Cervicalgia: Secondary | ICD-10-CM | POA: Diagnosis not present

## 2015-12-04 DIAGNOSIS — M6281 Muscle weakness (generalized): Secondary | ICD-10-CM

## 2015-12-04 DIAGNOSIS — M25611 Stiffness of right shoulder, not elsewhere classified: Secondary | ICD-10-CM | POA: Diagnosis not present

## 2015-12-04 DIAGNOSIS — M25511 Pain in right shoulder: Secondary | ICD-10-CM | POA: Diagnosis not present

## 2015-12-04 DIAGNOSIS — J309 Allergic rhinitis, unspecified: Secondary | ICD-10-CM | POA: Diagnosis not present
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
## 2015-12-05 ENCOUNTER — Ambulatory Visit (INDEPENDENT_AMBULATORY_CARE_PROVIDER_SITE_OTHER): Payer: Medicare Other | Admitting: Internal Medicine

## 2015-12-05 ENCOUNTER — Encounter: Payer: Self-pay | Admitting: Internal Medicine

## 2015-12-05 ENCOUNTER — Ambulatory Visit: Payer: Medicare Other | Admitting: Physical Therapy

## 2015-12-05 DIAGNOSIS — M542 Cervicalgia: Secondary | ICD-10-CM | POA: Diagnosis not present

## 2015-12-05 DIAGNOSIS — S161XXA Strain of muscle, fascia and tendon at neck level, initial encounter: Secondary | ICD-10-CM

## 2015-12-05 DIAGNOSIS — M6281 Muscle weakness (generalized): Secondary | ICD-10-CM | POA: Diagnosis not present

## 2015-12-05 DIAGNOSIS — M25611 Stiffness of right shoulder, not elsewhere classified: Secondary | ICD-10-CM

## 2015-12-05 DIAGNOSIS — M25511 Pain in right shoulder: Secondary | ICD-10-CM

## 2015-12-05 DIAGNOSIS — I10 Essential (primary) hypertension: Secondary | ICD-10-CM | POA: Diagnosis not present

## 2015-12-05 MED ORDER — OXYCODONE-ACETAMINOPHEN 5-325 MG PO TABS: 1.0000 | ORAL_TABLET | Freq: Three times a day (TID) | ORAL | 0 refills | Status: DC | PRN

## 2015-12-05 MED ORDER — NAPROXEN 500 MG PO TABS: 500.0000 mg | ORAL_TABLET | Freq: Two times a day (BID) | ORAL | 1 refills | Status: DC | PRN
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
## 2015-12-05 NOTE — Assessment & Plan Note (Signed)
[place]-[facility] 

## 2015-12-05 NOTE — Assessment & Plan Note (Signed)
Pain has moved to the R shoulder Percocet, Naproxen prn  Potential benefits of opioids use as well as potential risks (i.e. addiction risk, apnea etc) and complications (i.e. Somnolence, constipation and others) were explained to the patient and were aknowledged. Cont PT Ortho ref if not better

## 2015-12-05 NOTE — Progress Notes (Signed)
Pre visit review using our clinic review tool, if applicable. No additional management support is needed unless otherwise documented below in the visit note. 
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
## 2015-12-11 ENCOUNTER — Ambulatory Visit: Payer: Medicare Other | Admitting: Physical Therapy

## 2015-12-11 ENCOUNTER — Ambulatory Visit (INDEPENDENT_AMBULATORY_CARE_PROVIDER_SITE_OTHER): Payer: Medicare Other | Admitting: *Deleted

## 2015-12-11 DIAGNOSIS — M25611 Stiffness of right shoulder, not elsewhere classified: Secondary | ICD-10-CM | POA: Diagnosis not present

## 2015-12-11 DIAGNOSIS — M6281 Muscle weakness (generalized): Secondary | ICD-10-CM | POA: Diagnosis not present

## 2015-12-11 DIAGNOSIS — J309 Allergic rhinitis, unspecified: Secondary | ICD-10-CM

## 2015-12-11 DIAGNOSIS — M25511 Pain in right shoulder: Secondary | ICD-10-CM | POA: Diagnosis not present

## 2015-12-11 DIAGNOSIS — M542 Cervicalgia: Secondary | ICD-10-CM
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
## 2015-12-13 ENCOUNTER — Ambulatory Visit: Payer: Medicare Other | Admitting: Physical Therapy

## 2015-12-13 DIAGNOSIS — M25511 Pain in right shoulder: Secondary | ICD-10-CM

## 2015-12-13 DIAGNOSIS — M25611 Stiffness of right shoulder, not elsewhere classified: Secondary | ICD-10-CM

## 2015-12-13 DIAGNOSIS — M542 Cervicalgia: Secondary | ICD-10-CM

## 2015-12-13 DIAGNOSIS — M6281 Muscle weakness (generalized): Secondary | ICD-10-CM
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
## 2015-12-18 ENCOUNTER — Ambulatory Visit (INDEPENDENT_AMBULATORY_CARE_PROVIDER_SITE_OTHER): Payer: Medicare Other

## 2015-12-18 ENCOUNTER — Ambulatory Visit: Payer: Medicare Other | Admitting: Physical Therapy

## 2015-12-18 DIAGNOSIS — J309 Allergic rhinitis, unspecified: Secondary | ICD-10-CM | POA: Diagnosis not present

## 2015-12-18 DIAGNOSIS — M6281 Muscle weakness (generalized): Secondary | ICD-10-CM | POA: Diagnosis not present

## 2015-12-18 DIAGNOSIS — M25611 Stiffness of right shoulder, not elsewhere classified: Secondary | ICD-10-CM | POA: Diagnosis not present

## 2015-12-18 DIAGNOSIS — M542 Cervicalgia: Secondary | ICD-10-CM

## 2015-12-18 DIAGNOSIS — M25511 Pain in right shoulder: Secondary | ICD-10-CM
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
## 2015-12-20 ENCOUNTER — Encounter: Payer: Medicare Other | Admitting: Physical Therapy

## 2015-12-26 ENCOUNTER — Telehealth: Payer: Self-pay | Admitting: *Deleted

## 2015-12-26 ENCOUNTER — Ambulatory Visit (INDEPENDENT_AMBULATORY_CARE_PROVIDER_SITE_OTHER): Payer: Medicare Other | Admitting: *Deleted

## 2015-12-26 ENCOUNTER — Ambulatory Visit: Payer: Medicare Other | Admitting: Physical Therapy

## 2015-12-26 DIAGNOSIS — M6281 Muscle weakness (generalized): Secondary | ICD-10-CM

## 2015-12-26 DIAGNOSIS — M25611 Stiffness of right shoulder, not elsewhere classified: Secondary | ICD-10-CM | POA: Diagnosis not present

## 2015-12-26 DIAGNOSIS — J309 Allergic rhinitis, unspecified: Secondary | ICD-10-CM

## 2015-12-26 DIAGNOSIS — M25511 Pain in right shoulder: Secondary | ICD-10-CM

## 2015-12-26 DIAGNOSIS — M542 Cervicalgia: Secondary | ICD-10-CM | POA: Diagnosis not present
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
## 2015-12-26 NOTE — Telephone Encounter (Signed)
I called pt- Pt informed of below. She is scheduled to see Amado Coe on 12/27/15.

## 2015-12-26 NOTE — Telephone Encounter (Signed)
Rec'd call pt states she still have a dry cough mainly at night which keep her up at night. Req MD to rx something...Johny Chess

## 2015-12-26 NOTE — Telephone Encounter (Signed)
Patient called to follow up  °

## 2015-12-26 NOTE — Telephone Encounter (Signed)
Use over-the-counter  "cold" medicines  such as " Delsym" or" Robitussin" cough syrup varietis for cough.  You can use plain "Tylenol" or "Advil" for fever, chills and achyness. Use Halls or Ricola cough drops.   "Common cold" symptoms are usually triggered by a virus.  The antibiotics are usually not necessary. On average, a" viral cold" illness would take 4-7 days to resolve. Please, make an appointment if you are not better or if you're worse.

## 2015-12-27 ENCOUNTER — Encounter: Payer: Self-pay | Admitting: Nurse Practitioner

## 2015-12-27 ENCOUNTER — Ambulatory Visit (INDEPENDENT_AMBULATORY_CARE_PROVIDER_SITE_OTHER): Payer: Medicare Other | Admitting: Nurse Practitioner

## 2015-12-27 ENCOUNTER — Ambulatory Visit (INDEPENDENT_AMBULATORY_CARE_PROVIDER_SITE_OTHER)
Admission: RE | Admit: 2015-12-27 | Discharge: 2015-12-27 | Disposition: A | Discharge status: Home / Self Care | Payer: Medicare Other | Source: Ambulatory Visit | Attending: Nurse Practitioner | Admitting: Nurse Practitioner

## 2015-12-27 ENCOUNTER — Telehealth: Payer: Self-pay | Admitting: Internal Medicine

## 2015-12-27 VITALS — BP 110/78 | HR 67 | Temp 98.2°F | Ht 62.0 in | Wt 223.0 lb

## 2015-12-27 DIAGNOSIS — J45901 Unspecified asthma with (acute) exacerbation: Secondary | ICD-10-CM | POA: Diagnosis not present

## 2015-12-27 DIAGNOSIS — R05 Cough: Secondary | ICD-10-CM | POA: Diagnosis not present

## 2015-12-27 DIAGNOSIS — J9801 Acute bronchospasm: Secondary | ICD-10-CM

## 2015-12-27 DIAGNOSIS — R0602 Shortness of breath: Secondary | ICD-10-CM | POA: Diagnosis not present

## 2015-12-27 MED ORDER — METHYLPREDNISOLONE ACETATE 40 MG/ML IJ SUSP
40.0000 mg | Freq: Once | INTRAMUSCULAR | Status: AC
  Administered 2015-12-27: 40 mg via INTRAMUSCULAR

## 2015-12-27 MED ORDER — GUAIFENESIN-DM 100-10 MG/5ML PO SYRP: 5.0000 mL | ORAL_SOLUTION | ORAL | 0 refills | Status: DC | PRN

## 2015-12-27 MED ORDER — BENZONATATE 100 MG PO CAPS: 100.0000 mg | ORAL_CAPSULE | Freq: Three times a day (TID) | ORAL | 0 refills | Status: DC | PRN

## 2015-12-27 MED ORDER — METHYLPREDNISOLONE ACETATE 40 MG/ML IJ SUSP: 40.0000 mg | Freq: Once | INTRAMUSCULAR | Status: DC

## 2015-12-27 MED ORDER — PREDNISONE 10 MG (21) PO TBPK: 10.0000 mg | ORAL_TABLET | ORAL | 0 refills | Status: DC

## 2015-12-27 NOTE — Progress Notes (Signed)
Normal results, no oral abx needed at this time. Sent oral prednisone dose pack. Start tomorrow.

## 2015-12-27 NOTE — Telephone Encounter (Signed)
lmtcb X1 for pt. I do not see where our office has called pt.

## 2015-12-27 NOTE — Patient Instructions (Addendum)
Normal CXR.  Treating for asthma exacerbation

## 2015-12-27 NOTE — Progress Notes (Signed)
Pre visit review using our clinic review tool, if applicable. No additional management support is needed unless otherwise documented below in the visit note. 
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
## 2016-01-01 NOTE — Telephone Encounter (Signed)
lmtcb x2 for pt. 

## 2016-01-02 ENCOUNTER — Ambulatory Visit (INDEPENDENT_AMBULATORY_CARE_PROVIDER_SITE_OTHER): Payer: Medicare Other

## 2016-01-02 ENCOUNTER — Telehealth: Payer: Self-pay | Admitting: Internal Medicine

## 2016-01-02 ENCOUNTER — Ambulatory Visit: Payer: Medicare Other | Admitting: Physical Therapy

## 2016-01-02 ENCOUNTER — Ambulatory Visit: Payer: Medicare Other | Attending: Internal Medicine | Admitting: Physical Therapy

## 2016-01-02 DIAGNOSIS — M25611 Stiffness of right shoulder, not elsewhere classified: Secondary | ICD-10-CM

## 2016-01-02 DIAGNOSIS — M6281 Muscle weakness (generalized): Secondary | ICD-10-CM | POA: Diagnosis not present

## 2016-01-02 DIAGNOSIS — M25511 Pain in right shoulder: Secondary | ICD-10-CM | POA: Diagnosis not present

## 2016-01-02 DIAGNOSIS — J309 Allergic rhinitis, unspecified: Secondary | ICD-10-CM

## 2016-01-02 DIAGNOSIS — M542 Cervicalgia: Secondary | ICD-10-CM | POA: Diagnosis not present

## 2016-01-02 MED ORDER — PREDNISONE 20 MG PO TABS: 20.0000 mg | ORAL_TABLET | Freq: Every day | ORAL | 0 refills | Status: DC

## 2016-01-02 MED ORDER — PROMETHAZINE-CODEINE 6.25-10 MG/5ML PO SYRP: 5.0000 mL | ORAL_SOLUTION | Freq: Four times a day (QID) | ORAL | 0 refills | Status: DC | PRN

## 2016-01-02 NOTE — Telephone Encounter (Signed)
Patient is returning phone call.  °

## 2016-01-02 NOTE — Patient Instructions (Signed)
From exercise drawer, Doorway stretch 3 X 20 daily Written instructions for sitting trunk rotation. Wall push ups 10 X Avoid pain

## 2016-01-02 NOTE — Telephone Encounter (Signed)
Both rx have been called to the pharmacy for the pt.  Pt is aware and nothing further is needed.

## 2016-01-02 NOTE — Telephone Encounter (Signed)
lmtcb x1 for pt. 
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
## 2016-01-02 NOTE — Telephone Encounter (Signed)
Called and spoke with pt and she stated that she was seen by her PCP last week and given tessalon perles and told to take otc robitussin DM and these are not helping with the cough. She stated that she has had the cough x [redacted] weeks along with nasal congestion and PND. Pt is requesting to have a cough med that will help her.  CY please advise.   Last ov---09/14/15  No Known Allergies

## 2016-01-02 NOTE — Telephone Encounter (Signed)
Ok to offer prometh with codeine 120 ml,    5 ml every 6 hours if needed for cough                     Prednisone 20 mg, # 5, 1 daily

## 2016-01-03 NOTE — Telephone Encounter (Signed)
lmtcb x3 for pt. Per triage protocol, message will be closed.  

## 2016-01-08 IMAGING — CR DG CHEST 2V
2 series · 2 of 2 positions shown · non-contrast
Comparison: 02/09/2013 and prior chest radiographs dating back to
04/06/2009

CLINICAL DATA: 63-year-old female -preoperative respiratory
examination.

EXAM:
CHEST  2 VIEW

[w chest pa]
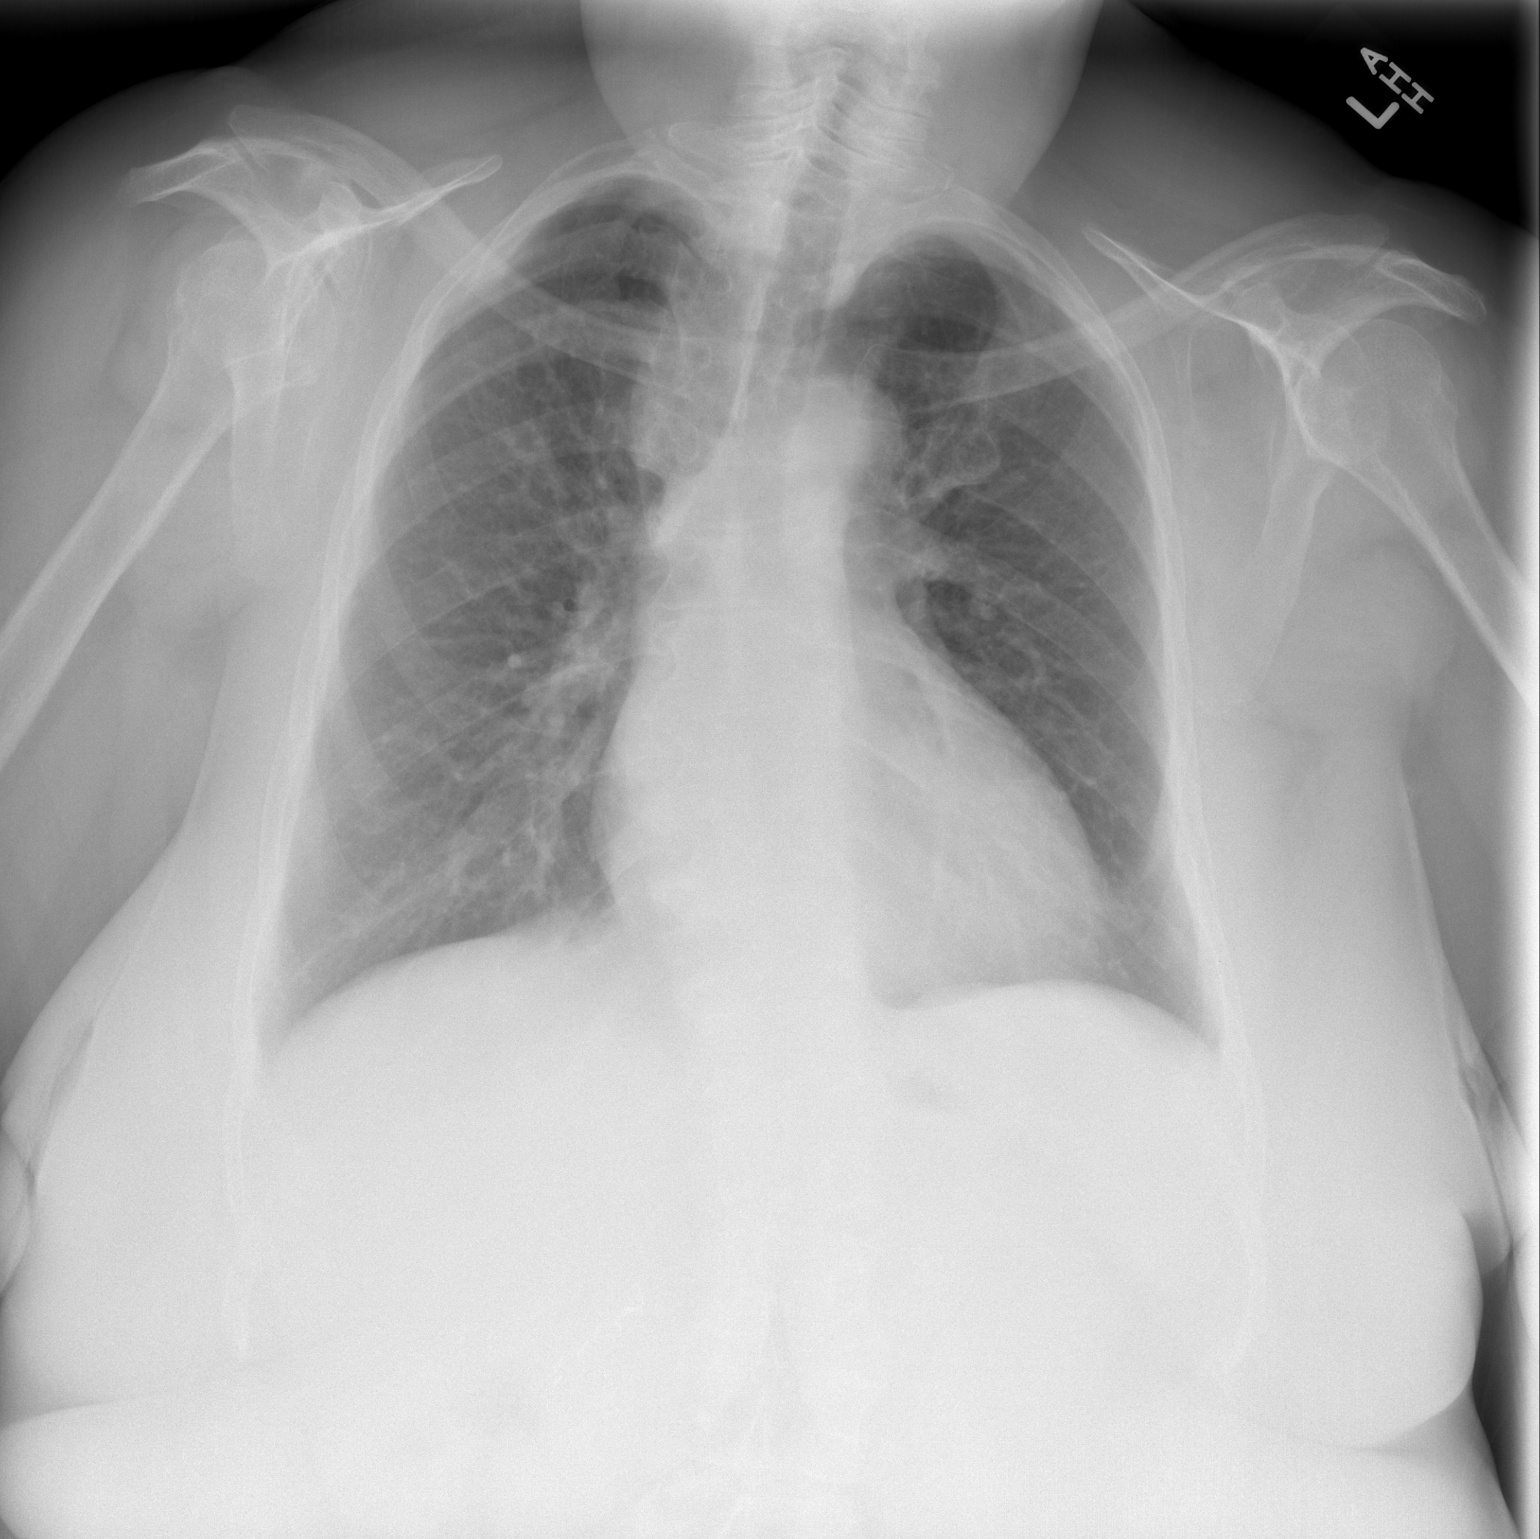

[w chest lat]
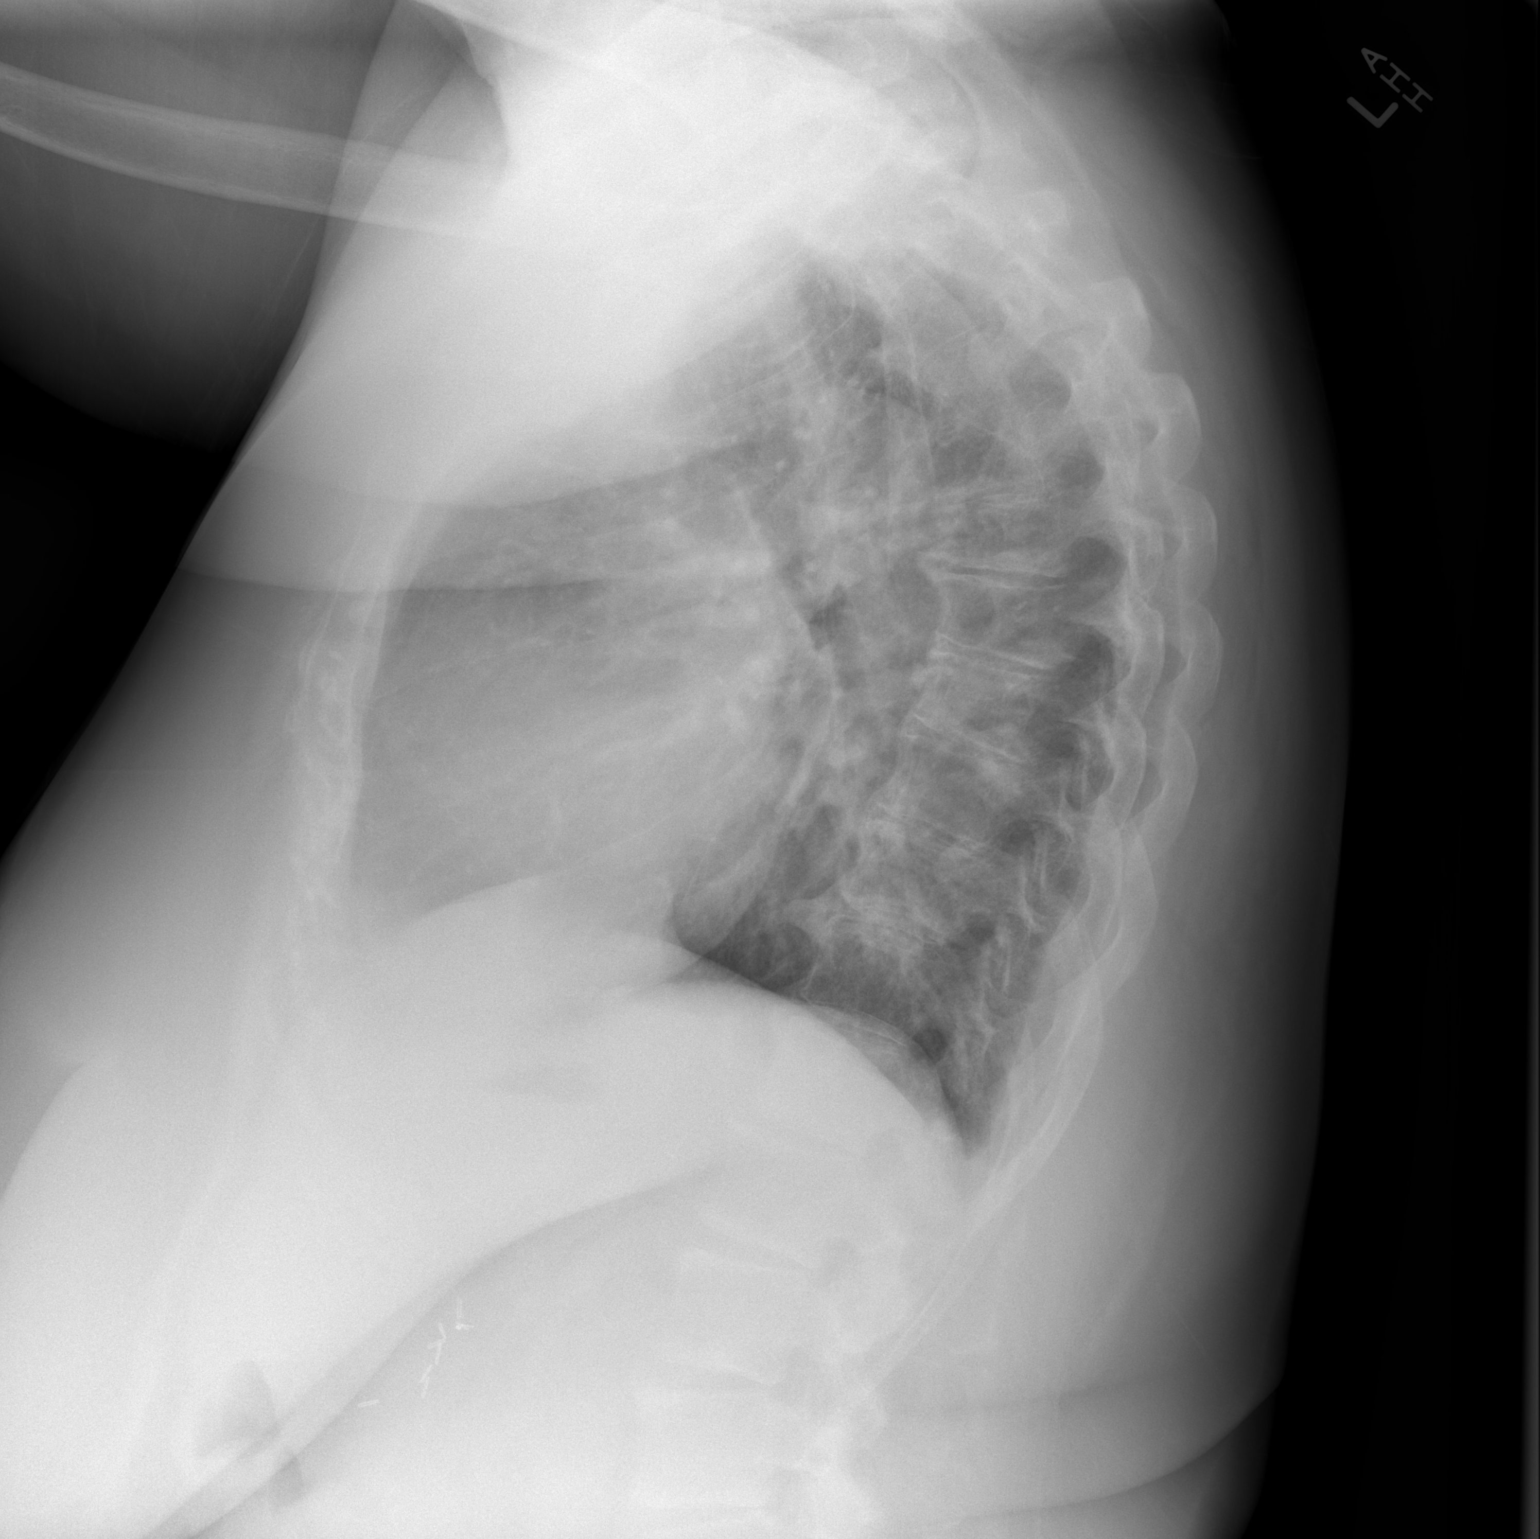

[2 of 2 positions shown; findings below may reference images not displayed]

FINDINGS: This patient is slightly rotated to the left.

The cardiopericardial silhouette is unremarkable.

Mediastinal silhouette is stable.

There is no evidence of focal airspace disease, pulmonary edema,
suspicious pulmonary nodule/mass, pleural effusion, or pneumothorax.
No acute bony abnormalities are identified.
IMPRESSION: No active cardiopulmonary disease.

## 2016-01-09 ENCOUNTER — Ambulatory Visit: Payer: Medicare Other | Admitting: Physical Therapy

## 2016-01-09 ENCOUNTER — Ambulatory Visit (INDEPENDENT_AMBULATORY_CARE_PROVIDER_SITE_OTHER): Payer: Medicare Other

## 2016-01-09 DIAGNOSIS — M6281 Muscle weakness (generalized): Secondary | ICD-10-CM | POA: Diagnosis not present

## 2016-01-09 DIAGNOSIS — M25611 Stiffness of right shoulder, not elsewhere classified: Secondary | ICD-10-CM

## 2016-01-09 DIAGNOSIS — J309 Allergic rhinitis, unspecified: Secondary | ICD-10-CM

## 2016-01-09 DIAGNOSIS — M542 Cervicalgia: Secondary | ICD-10-CM | POA: Diagnosis not present

## 2016-01-09 DIAGNOSIS — M25511 Pain in right shoulder: Secondary | ICD-10-CM
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
## 2016-01-15 ENCOUNTER — Ambulatory Visit (INDEPENDENT_AMBULATORY_CARE_PROVIDER_SITE_OTHER): Payer: Medicare Other | Admitting: *Deleted

## 2016-01-15 ENCOUNTER — Encounter: Payer: Self-pay | Admitting: Internal Medicine

## 2016-01-15 ENCOUNTER — Ambulatory Visit (INDEPENDENT_AMBULATORY_CARE_PROVIDER_SITE_OTHER): Payer: Medicare Other | Admitting: Internal Medicine

## 2016-01-15 VITALS — BP 118/64 | HR 68 | Wt 222.0 lb

## 2016-01-15 DIAGNOSIS — I10 Essential (primary) hypertension: Secondary | ICD-10-CM | POA: Diagnosis not present

## 2016-01-15 DIAGNOSIS — Z23 Encounter for immunization: Secondary | ICD-10-CM

## 2016-01-15 DIAGNOSIS — J4520 Mild intermittent asthma, uncomplicated: Secondary | ICD-10-CM

## 2016-01-15 DIAGNOSIS — K219 Gastro-esophageal reflux disease without esophagitis: Secondary | ICD-10-CM

## 2016-01-15 DIAGNOSIS — M545 Low back pain: Secondary | ICD-10-CM

## 2016-01-15 DIAGNOSIS — G8929 Other chronic pain: Secondary | ICD-10-CM

## 2016-01-15 DIAGNOSIS — J309 Allergic rhinitis, unspecified: Secondary | ICD-10-CM | POA: Diagnosis not present

## 2016-01-15 MED ORDER — VALSARTAN-HYDROCHLOROTHIAZIDE 160-25 MG PO TABS: 1.0000 | ORAL_TABLET | Freq: Every day | ORAL | 3 refills | Status: DC

## 2016-01-15 MED ORDER — OXYCODONE-ACETAMINOPHEN 5-325 MG PO TABS: 1.0000 | ORAL_TABLET | Freq: Three times a day (TID) | ORAL | 0 refills | Status: DC | PRN

## 2016-01-15 MED ORDER — OMEPRAZOLE 20 MG PO CPDR: 20.0000 mg | DELAYED_RELEASE_CAPSULE | Freq: Every day | ORAL | 3 refills | Status: DC

## 2016-01-15 NOTE — Progress Notes (Signed)
Pre visit review using our clinic review tool, if applicable. No additional management support is needed unless otherwise documented below in the visit note. 

## 2016-01-15 NOTE — Assessment & Plan Note (Signed)
Percocet prn  Potential benefits of a long term opioids use as well as potential risks (i.e. addiction risk, apnea etc) and complications (i.e. Somnolence, constipation and others) were explained to the patient and were aknowledged.  

## 2016-01-15 NOTE — Progress Notes (Signed)
1147 is void. Please disregard.

## 2016-01-15 NOTE — Assessment & Plan Note (Signed)
Singulair Proair PRN

## 2016-01-15 NOTE — Assessment & Plan Note (Signed)
Valsartan HCT 

## 2016-01-15 NOTE — Assessment & Plan Note (Addendum)
Omeprazole, Zantac  Potential benefits of a long term PPI use as well as potential risks  and complications were explained to the patient and were aknowledged.

## 2016-01-15 NOTE — Progress Notes (Signed)
Subjective:  Patient ID: [name], female    DOB: [birth year]  Age: 65 y.o. MRN: [id number]  CC: No chief complaint on file.   HPI [name] presents for asthma, OA/LBP, Vit B12 def  Outpatient Medications Prior to Visit  Medication Sig Dispense Refill  . albuterol (PROAIR HFA) 108 (90 Base) MCG/ACT inhaler INHALE 2 PUFFS INTO THE LUNGS BY MOUTH EVERY 6 HOURS AS NEEDED FOR WHEEZING/SHORTNESS OF BREATH 25.5 each 11  . albuterol (PROVENTIL) (2.5 MG/3ML) 0.083% nebulizer solution Take 3 mLs (2.5 mg total) by nebulization every 6 (six) hours as needed for wheezing or shortness of breath. 120 mL 12  . aspirin 81 MG tablet Take 81 mg by mouth daily.     . benzonatate (TESSALON) 100 MG capsule Take 1 capsule (100 mg total) by mouth 3 (three) times daily as needed for cough. 20 capsule 0  . cetirizine (ZYRTEC) 10 MG tablet Take 1 tablet (10 mg total) by mouth daily. 100 tablet 3  . Cholecalciferol (VITAMIN D) 1000 UNITS capsule Take 1,000 Units by mouth every morning.     . Cyanocobalamin (VITAMIN B-12) 1000 MCG SUBL Place 1 tablet (1,000 mcg total) under the tongue daily. 100 tablet 3  . ferrous sulfate 325 (65 FE) MG tablet Take 325 mg by mouth 3 (three) times daily with meals.     . fluticasone furoate-vilanterol (BREO ELLIPTA) 100-25 MCG/INH AEPB Inhale 1 puff, then rinse mouth, once daily 60 each 12  . furosemide (LASIX) 20 MG tablet Take 20-40 mg by mouth daily as needed for fluid.    Marland Kitchen guaiFENesin-dextromethorphan (ROBITUSSIN DM) 100-10 MG/5ML syrup Take 5 mLs by mouth every 4 (four) hours as needed for cough. 118 mL 0  . methocarbamol (ROBAXIN) 500 MG tablet Take 1 tablet (500 mg total) by mouth every 8 (eight) hours as needed for muscle spasms. 60 tablet 1  . mometasone (NASONEX) 50 MCG/ACT nasal spray Place 2 sprays into the nose daily as needed (allergies). 17 g 11  . montelukast (SINGULAIR) 10 MG tablet Take 1 tablet (10 mg total) by mouth at bedtime. 90 tablet 3  .  naproxen (NAPROSYN) 500 MG tablet Take 1 tablet (500 mg total) by mouth 2 (two) times daily as needed. 60 tablet 1  . NONFORMULARY OR COMPOUNDED ITEM Inject 1 Applicatorful as directed once a week. Allergy Vaccine 1:10 Given at [facility]    . omeprazole (PRILOSEC) 20 MG capsule Take 20 mg by mouth daily.    Marland Kitchen oxyCODONE-acetaminophen (PERCOCET/ROXICET) 5-325 MG tablet Take 1 tablet by mouth every 8 (eight) hours as needed for moderate pain. 60 tablet 0  . potassium chloride (K-DUR) 10 MEQ tablet Take 1 tablet (10 mEq total) by mouth daily. 90 tablet 3  . predniSONE (DELTASONE) 20 MG tablet Take 1 tablet (20 mg total) by mouth daily with breakfast. 5 tablet 0  . promethazine-codeine (PHENERGAN WITH CODEINE) 6.25-10 MG/5ML syrup Take 5 mLs by mouth every 6 (six) hours as needed for cough. 120 mL 0  . ranitidine (ZANTAC) 150 MG tablet Take 1 tablet (150 mg total) by mouth at bedtime. 30 tablet 5  . valsartan-hydrochlorothiazide (DIOVAN-HCT) 160-25 MG tablet Take 1 tablet by mouth daily. 90 tablet 3   Facility-Administered Medications Prior to Visit  Medication Dose Route Frequency Provider Last Rate Last Dose  . methylPREDNISolone acetate (DEPO-MEDROL) injection 40 mg  40 mg Intramuscular Once Flossie Buffy, NP        ROS Review of Systems  Constitutional: Negative for activity change, appetite change, chills, fatigue and unexpected weight change.  HENT: Negative for congestion, mouth sores and sinus pressure.   Eyes: Negative for visual disturbance.  Respiratory: Negative for cough and chest tightness.   Gastrointestinal: Negative for abdominal pain and nausea.  Genitourinary: Negative for difficulty urinating, frequency and vaginal pain.  Musculoskeletal: Positive for arthralgias and back pain. Negative for gait problem.  Skin: Negative for pallor and rash.  Neurological: Negative for dizziness, tremors, weakness, numbness and headaches.  Psychiatric/Behavioral: Negative for  confusion and sleep disturbance.    Objective:  BP 118/64   Pulse 68   Wt 222 lb (100.7 kg)   SpO2 96%   BMI 40.60 kg/m   BP Readings from Last 3 Encounters:  01/15/16 118/64  12/27/15 110/78  12/05/15 100/64    Wt Readings from Last 3 Encounters:  01/15/16 222 lb (100.7 kg)  12/27/15 223 lb (101.2 kg)  12/05/15 223 lb (101.2 kg)    Physical Exam  Constitutional: She appears well-developed. No distress.  HENT:  Head: Normocephalic.  Right Ear: External ear normal.  Left Ear: External ear normal.  Nose: Nose normal.  Mouth/Throat: Oropharynx is clear and moist.  Eyes: Conjunctivae are normal. Pupils are equal, round, and reactive to light. Right eye exhibits no discharge. Left eye exhibits no discharge.  Neck: Normal range of motion. Neck supple. No JVD present. No tracheal deviation present. No thyromegaly present.  Cardiovascular: Normal rate, regular rhythm and normal heart sounds.   Pulmonary/Chest: No stridor. No respiratory distress. She has no wheezes.  Abdominal: Soft. Bowel sounds are normal. She exhibits no distension and no mass. There is no tenderness. There is no rebound and no guarding.  Musculoskeletal: She exhibits no edema or tenderness.  Lymphadenopathy:    She has no cervical adenopathy.  Neurological: She displays normal reflexes. No cranial nerve deficit. She exhibits normal muscle tone. Coordination normal.  Skin: No rash noted. No erythema.  Psychiatric: She has a normal mood and affect. Her behavior is normal. Judgment and thought content normal.  Obese  Lab Results  Component Value Date   WBC 6.7 11/07/2015   HGB 11.7 11/07/2015   HCT 37.0 11/07/2015   PLT 252 11/07/2015   GLUCOSE 106 (H) 03/08/2015   CHOL 144 07/14/2012   TRIG 59.0 07/14/2012   HDL 76.60 07/14/2012   LDLCALC 56 07/14/2012   ALT 18 02/03/2015   AST 20 02/03/2015   NA 143 03/08/2015   K 4.0 03/08/2015   CL 108 03/08/2015   CREATININE 1.03 (H) 03/14/2015   BUN 6  03/08/2015   CO2 29 03/08/2015   TSH 1.91 01/27/2013   INR 1.07 02/02/2013   HGBA1C 5.5 09/20/2013    Dg Chest 2 View  Result Date: 12/27/2015 CLINICAL DATA:  Cough and shortness of breath. EXAM: CHEST  2 VIEW COMPARISON:  02/03/2015 FINDINGS: Stable heart size and mediastinal contours, distorted by scoliosis and rotation. No cardiomegaly. There is no edema, consolidation, effusion, or pneumothorax. Spondylosis, exaggerated kyphosis, and dextroscoliosis. No acute osseous finding. Cholecystectomy clips. IMPRESSION: Stable from prior.  No evidence of acute disease. Electronically Signed   By: Monte Fantasia M.D.   On: 12/27/2015 12:20    Assessment & Plan:   There are no diagnoses linked to this encounter. I am having Ms. Smoley maintain her Vitamin D, aspirin, ferrous sulfate, furosemide, omeprazole, NONFORMULARY OR COMPOUNDED ITEM, cetirizine, Vitamin B-12, valsartan-hydrochlorothiazide, mometasone, montelukast, albuterol, potassium chloride, albuterol, fluticasone furoate-vilanterol, ranitidine, methocarbamol, oxyCODONE-acetaminophen, naproxen,  benzonatate, guaiFENesin-dextromethorphan, predniSONE, and promethazine-codeine. We will continue to administer methylPREDNISolone acetate.  No orders of the defined types were placed in this encounter.    Follow-up: No Follow-up on file.  Walker Kehr, MD

## 2016-01-16 ENCOUNTER — Ambulatory Visit: Payer: Medicare Other | Admitting: Physical Therapy

## 2016-01-16 ENCOUNTER — Ambulatory Visit: Payer: Medicare Other

## 2016-01-16 DIAGNOSIS — M542 Cervicalgia: Secondary | ICD-10-CM

## 2016-01-16 DIAGNOSIS — M25611 Stiffness of right shoulder, not elsewhere classified: Secondary | ICD-10-CM

## 2016-01-16 DIAGNOSIS — M6281 Muscle weakness (generalized): Secondary | ICD-10-CM | POA: Diagnosis not present

## 2016-01-16 DIAGNOSIS — M25511 Pain in right shoulder: Secondary | ICD-10-CM | POA: Diagnosis not present
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
## 2016-01-23 ENCOUNTER — Ambulatory Visit: Payer: Medicare Other | Admitting: Physical Therapy

## 2016-01-25 ENCOUNTER — Ambulatory Visit (INDEPENDENT_AMBULATORY_CARE_PROVIDER_SITE_OTHER): Payer: Medicare Other | Admitting: *Deleted

## 2016-01-25 DIAGNOSIS — J309 Allergic rhinitis, unspecified: Secondary | ICD-10-CM | POA: Diagnosis not present

## 2016-01-25 NOTE — Progress Notes (Signed)
Final shot out of current vaccine.

## 2016-01-30 ENCOUNTER — Ambulatory Visit: Payer: Medicare Other | Admitting: Physical Therapy

## 2016-03-12 ENCOUNTER — Other Ambulatory Visit (HOSPITAL_BASED_OUTPATIENT_CLINIC_OR_DEPARTMENT_OTHER): Payer: Medicare HMO

## 2016-03-12 ENCOUNTER — Ambulatory Visit (HOSPITAL_BASED_OUTPATIENT_CLINIC_OR_DEPARTMENT_OTHER): Payer: Medicare HMO | Admitting: Oncology

## 2016-03-12 ENCOUNTER — Telehealth: Payer: Self-pay | Admitting: Oncology

## 2016-03-12 ENCOUNTER — Other Ambulatory Visit: Payer: Self-pay | Admitting: *Deleted

## 2016-03-12 VITALS — BP 132/80 | HR 60 | Temp 98.4°F | Resp 18 | Ht 62.0 in | Wt 225.9 lb

## 2016-03-12 DIAGNOSIS — C184 Malignant neoplasm of transverse colon: Secondary | ICD-10-CM

## 2016-03-12 DIAGNOSIS — Z85038 Personal history of other malignant neoplasm of large intestine: Secondary | ICD-10-CM

## 2016-03-12 DIAGNOSIS — R718 Other abnormality of red blood cells: Secondary | ICD-10-CM | POA: Diagnosis not present

## 2016-03-12 LAB — CBC WITH DIFFERENTIAL/PLATELET
BASO%: 0.8 % (ref 0.0–2.0)
Basophils Absolute: 0 10*3/uL (ref 0.0–0.1)
EOS%: 1.3 % (ref 0.0–7.0)
Eosinophils Absolute: 0.1 10*3/uL (ref 0.0–0.5)
HCT: 37.2 % (ref 34.8–46.6)
HGB: 12 g/dL (ref 11.6–15.9)
LYMPH%: 19.9 % (ref 14.0–49.7)
MCH: 24 pg — ABNORMAL LOW (ref 25.1–34.0)
MCHC: 32.1 g/dL (ref 31.5–36.0)
MCV: 74.6 fL — ABNORMAL LOW (ref 79.5–101.0)
MONO#: 0.6 10*3/uL (ref 0.1–0.9)
MONO%: 9.1 % (ref 0.0–14.0)
NEUT%: 68.9 % (ref 38.4–76.8)
NEUTROS ABS: 4.4 10*3/uL (ref 1.5–6.5)
PLATELETS: 292 10*3/uL (ref 145–400)
RBC: 4.99 10*6/uL (ref 3.70–5.45)
RDW: 16.5 % — ABNORMAL HIGH (ref 11.2–14.5)
WBC: 6.3 10*3/uL (ref 3.9–10.3)
lymph#: 1.3 10*3/uL (ref 0.9–3.3)

## 2016-03-12 LAB — CEA (IN HOUSE-CHCC): CEA (CHCC-IN HOUSE): 1.48 ng/mL (ref 0.00–5.00)

## 2016-03-12 LAB — FERRITIN: FERRITIN: 91 ng/mL (ref 9–269)

## 2016-03-12 MED ORDER — PROMETHAZINE-CODEINE 6.25-10 MG/5ML PO SYRP: 5.0000 mL | ORAL_SOLUTION | Freq: Four times a day (QID) | ORAL | 0 refills | Status: DC | PRN

## 2016-03-12 NOTE — Telephone Encounter (Signed)
Appointments scheduled per 2/13 LOS. Patient given AVS report and calendars with future scheduled appointments. °

## 2016-03-12 NOTE — Progress Notes (Signed)
  [facility] OFFICE PROGRESS NOTE   Diagnosis: Colon cancer  INTERVAL HISTORY:   [name] returns as scheduled. She feels well. Good appetite. No difficulty with bowel function. No complaint.  Objective:  Vital signs in last 24 hours:  Blood pressure 132/80, pulse 60, temperature 98.4 F (36.9 C), temperature source Oral, resp. rate 18, height '5\' 2"'$  (1.575 m), weight 225 lb 14.4 oz (102.5 kg), SpO2 100 %.    HEENT: Neck without mass Lymphatics: No cervical, supraclavicular, axillary, or inguinal nodes Resp: Lungs clear bilaterally Cardio: Regular rate and rhythm GI: No hepatosplenomegaly, no mass, nontender Vascular: No leg edema   Lab Results:  Lab Results  Component Value Date   WBC 6.3 03/12/2016   HGB 12.0 03/12/2016   HCT 37.2 03/12/2016   MCV 74.6 (L) 03/12/2016   PLT 292 03/12/2016   NEUTROABS 4.4 03/12/2016   CEA-1.48  Medications: I have reviewed the patient's current medications.  Assessment/Plan: 1. 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   Presentation to the emergency room 02/02/2013 with a colonic obstruction secondary to tumor at the splenic flexure.   Baseline CEA on 02/02/2013 less than 0.5.   Initiation of adjuvant Xeloda 04/10/2013.   Cycle 2 adjuvant Xeloda 05/01/2013.   Cycle 3 adjuvant Xeloda 05/22/2013.   Cycle 4 adjuvant Xeloda 06/12/2013.   Cycle 5 adjuvant Xeloda 07/03/2013 (Xeloda dose reduced due to hand foot syndrome).   Cycle 6 adjuvant Xeloda 07/24/2013.   Cycle 7 adjuvant Xeloda 08/14/2013   Cycle 8 adjuvant Xeloda 09/07/2013.  2. History of iron deficiency anemia. Recurrent anemia 02/01/2014, improved, Persistent red cell  microcytosis 3. Asthma. 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 5. Status post ostomy reversal 11/25/2013. 6. CT 02/01/2014 with no evidence of local tumor recurrence or metastatic disease. New area of masslike thickening and small bowel dilatation at the mid small bowel  Status post deep enteroscopy at [facility] on 03/16/2014, negative.  Status post capsule endoscopy 03/22/2014, confirmed a small bowel tumor  Exploratory laparotomy with resection of a small bowel mass on 04/06/2014 with the pathology confirming invasive adenocarcinoma extending through small bowel wall and involving adjacent loops of adherent small bowel, 2 of 12 lymph nodes positive. History consistent with recurrent colon cancer.Loss of MLH1 and PMS 2, MSI high as was the January 2015 tumor    Disposition:  [name] remains in clinical remission from colon cancer. The MCV remains low, but she is not anemic. She will return for an office and lab visit in 6 months. She is due for a colonoscopy later this year.  15 minutes were spent with the patient today. The majority of the time was used for counseling and coordination of care.    Betsy Coder, MD  03/12/2016  12:44 PM

## 2016-03-13 ENCOUNTER — Telehealth: Payer: Self-pay

## 2016-03-13 LAB — CEA: CEA1: 1.5 ng/mL (ref 0.0–4.7)

## 2016-03-13 NOTE — Telephone Encounter (Signed)
Called and informed pt of cea results and to follow up as scheduled. Pt verbalized understanding and denies any questions or concerns at this time.

## 2016-03-13 NOTE — Telephone Encounter (Signed)
-----   Message from Ladell Pier, MD sent at 03/13/2016  4:07 PM EST ----- Please call patient, cea is normal, f/u as scheduled

## 2016-03-23 ENCOUNTER — Other Ambulatory Visit: Payer: Self-pay | Admitting: Internal Medicine

## 2016-04-16 ENCOUNTER — Ambulatory Visit (INDEPENDENT_AMBULATORY_CARE_PROVIDER_SITE_OTHER): Payer: Medicare HMO | Admitting: Internal Medicine

## 2016-04-16 ENCOUNTER — Encounter: Payer: Self-pay | Admitting: Internal Medicine

## 2016-04-16 DIAGNOSIS — M545 Low back pain: Secondary | ICD-10-CM

## 2016-04-16 DIAGNOSIS — J4520 Mild intermittent asthma, uncomplicated: Secondary | ICD-10-CM

## 2016-04-16 DIAGNOSIS — C184 Malignant neoplasm of transverse colon: Secondary | ICD-10-CM

## 2016-04-16 DIAGNOSIS — C8593 Non-Hodgkin lymphoma, unspecified, intra-abdominal lymph nodes: Secondary | ICD-10-CM

## 2016-04-16 DIAGNOSIS — C8599 Non-Hodgkin lymphoma, unspecified, extranodal and solid organ sites: Secondary | ICD-10-CM

## 2016-04-16 DIAGNOSIS — Z6841 Body Mass Index (BMI) 40.0 and over, adult: Secondary | ICD-10-CM | POA: Diagnosis not present

## 2016-04-16 DIAGNOSIS — I10 Essential (primary) hypertension: Secondary | ICD-10-CM

## 2016-04-16 DIAGNOSIS — E6609 Other obesity due to excess calories: Secondary | ICD-10-CM | POA: Diagnosis not present

## 2016-04-16 DIAGNOSIS — G8929 Other chronic pain: Secondary | ICD-10-CM

## 2016-04-16 DIAGNOSIS — J069 Acute upper respiratory infection, unspecified: Secondary | ICD-10-CM

## 2016-04-16 DIAGNOSIS — IMO0001 Reserved for inherently not codable concepts without codable children: Secondary | ICD-10-CM

## 2016-04-16 MED ORDER — AZITHROMYCIN 250 MG PO TABS: ORAL_TABLET | ORAL | 0 refills | Status: DC

## 2016-04-16 MED ORDER — PROMETHAZINE-CODEINE 6.25-10 MG/5ML PO SYRP: 5.0000 mL | ORAL_SOLUTION | Freq: Four times a day (QID) | ORAL | 0 refills | Status: DC | PRN

## 2016-04-16 NOTE — Progress Notes (Signed)
Subjective:  Patient ID: LAYTOYA ION, female    DOB: Jun 18, 1950  Age: 66 y.o. MRN: [id number]  CC: Follow-up (HTN, Asthma, LBP, Apnea, ) and Cough (1 week, yellow phlgm, vomiting)   HPI Cami R Rathod presents for URI sx's x 5 d: cough, yellow phlegm F/u HTN, LBP, asthma  Outpatient Medications Prior to Visit  Medication Sig Dispense Refill  . albuterol (PROAIR HFA) 108 (90 Base) MCG/ACT inhaler INHALE 2 PUFFS INTO THE LUNGS BY MOUTH EVERY 6 HOURS AS NEEDED FOR WHEEZING/SHORTNESS OF BREATH 25.5 each 11  . albuterol (PROVENTIL) (2.5 MG/3ML) 0.083% nebulizer solution Take 3 mLs (2.5 mg total) by nebulization every 6 (six) hours as needed for wheezing or shortness of breath. 120 mL 12  . aspirin 81 MG tablet Take 81 mg by mouth daily.     . benzonatate (TESSALON) 100 MG capsule Take 1 capsule (100 mg total) by mouth 3 (three) times daily as needed for cough. 20 capsule 0  . cetirizine (ZYRTEC) 10 MG tablet Take 1 tablet (10 mg total) by mouth daily. 100 tablet 3  . Cholecalciferol (VITAMIN D) 1000 UNITS capsule Take 1,000 Units by mouth every morning.     . Cyanocobalamin (VITAMIN B-12) 1000 MCG SUBL Place 1 tablet (1,000 mcg total) under the tongue daily. 100 tablet 3  . ferrous sulfate 325 (65 FE) MG tablet Take 325 mg by mouth 3 (three) times daily with meals.     . fluticasone furoate-vilanterol (BREO ELLIPTA) 100-25 MCG/INH AEPB Inhale 1 puff, then rinse mouth, once daily 60 each 12  . furosemide (LASIX) 20 MG tablet Take 20-40 mg by mouth daily as needed for fluid.    . mometasone (NASONEX) 50 MCG/ACT nasal spray Place 2 sprays into the nose daily as needed (allergies). 17 g 11  . montelukast (SINGULAIR) 10 MG tablet TAKE ONE TABLET BY MOUTH ONCE DAILY AT BEDTIME 90 tablet 3  . naproxen (NAPROSYN) 500 MG tablet Take 1 tablet (500 mg total) by mouth 2 (two) times daily as needed. 60 tablet 1  . NONFORMULARY OR COMPOUNDED ITEM Inject 1 Applicatorful as directed once a week.  Allergy Vaccine 1:10 Given at [facility]     . omeprazole (PRILOSEC) 20 MG capsule Take 1 capsule (20 mg total) by mouth daily. 90 capsule 3  . potassium chloride (K-DUR) 10 MEQ tablet Take 1 tablet (10 mEq total) by mouth daily. 90 tablet 3  . promethazine-codeine (PHENERGAN WITH CODEINE) 6.25-10 MG/5ML syrup Take 5 mLs by mouth every 6 (six) hours as needed for cough. 120 mL 0  . ranitidine (ZANTAC) 150 MG tablet Take 1 tablet (150 mg total) by mouth at bedtime. 30 tablet 5  . valsartan-hydrochlorothiazide (DIOVAN-HCT) 160-25 MG tablet Take 1 tablet by mouth daily. 90 tablet 3  . guaiFENesin-dextromethorphan (ROBITUSSIN DM) 100-10 MG/5ML syrup Take 5 mLs by mouth every 4 (four) hours as needed for cough. (Patient not taking: Reported on 03/12/2016) 118 mL 0  . methocarbamol (ROBAXIN) 500 MG tablet Take 1 tablet (500 mg total) by mouth every 8 (eight) hours as needed for muscle spasms. (Patient not taking: Reported on 03/12/2016) 60 tablet 1  . oxyCODONE-acetaminophen (PERCOCET/ROXICET) 5-325 MG tablet Take 1 tablet by mouth every 8 (eight) hours as needed for moderate pain. (Patient not taking: Reported on 03/12/2016) 60 tablet 0  . predniSONE (DELTASONE) 20 MG tablet Take 1 tablet (20 mg total) by mouth daily with breakfast. (Patient not taking: Reported on 03/12/2016) 5 tablet 0  .  methylPREDNISolone acetate (DEPO-MEDROL) injection 40 mg      No facility-administered medications prior to visit.     ROS Review of Systems  Constitutional: Negative for activity change, appetite change, chills, fatigue and unexpected weight change.  HENT: Positive for sinus pressure. Negative for congestion and mouth sores.   Eyes: Negative for visual disturbance.  Respiratory: Positive for cough. Negative for chest tightness.   Gastrointestinal: Negative for abdominal pain and nausea.  Genitourinary: Negative for difficulty urinating, frequency and vaginal pain.  Musculoskeletal: Positive for back pain.  Negative for gait problem.  Skin: Negative for pallor and rash.  Neurological: Negative for dizziness, tremors, weakness, numbness and headaches.  Psychiatric/Behavioral: Negative for confusion and sleep disturbance. The patient is not nervous/anxious.     Objective:  BP 122/86   Pulse 67   Temp 98.7 F (37.1 C) (Oral)   Resp 16   Ht 5\' 2"  (1.575 m)   Wt 225 lb 12 oz (102.4 kg)   SpO2 97%   BMI 41.29 kg/m   BP Readings from Last 3 Encounters:  04/16/16 122/86  03/12/16 132/80  01/15/16 118/64    Wt Readings from Last 3 Encounters:  04/16/16 225 lb 12 oz (102.4 kg)  03/12/16 225 lb 14.4 oz (102.5 kg)  01/15/16 222 lb (100.7 kg)    Physical Exam  Constitutional: She appears well-developed. No distress.  HENT:  Head: Normocephalic.  Right Ear: External ear normal.  Left Ear: External ear normal.  Nose: Nose normal.  Mouth/Throat: Oropharynx is clear and moist.  Eyes: Conjunctivae are normal. Pupils are equal, round, and reactive to light. Right eye exhibits no discharge. Left eye exhibits no discharge.  Neck: Normal range of motion. Neck supple. No JVD present. No tracheal deviation present. No thyromegaly present.  Cardiovascular: Normal rate, regular rhythm and normal heart sounds.   Pulmonary/Chest: No stridor. No respiratory distress. She has no wheezes.  Abdominal: Soft. Bowel sounds are normal. She exhibits no distension and no mass. There is no tenderness. There is no rebound and no guarding.  Musculoskeletal: She exhibits tenderness. She exhibits no edema.  Lymphadenopathy:    She has no cervical adenopathy.  Neurological: She displays normal reflexes. No cranial nerve deficit. She exhibits normal muscle tone. Coordination normal.  Skin: No rash noted. No erythema.  Psychiatric: She has a normal mood and affect. Her behavior is normal. Judgment and thought content normal.  obese eryth throat  Lab Results  Component Value Date   WBC 6.3 03/12/2016   HGB 12.0  03/12/2016   HCT 37.2 03/12/2016   PLT 292 03/12/2016   GLUCOSE 106 (H) 03/08/2015   CHOL 144 07/14/2012   TRIG 59.0 07/14/2012   HDL 76.60 07/14/2012   LDLCALC 56 07/14/2012   ALT 18 02/03/2015   AST 20 02/03/2015   NA 143 03/08/2015   K 4.0 03/08/2015   CL 108 03/08/2015   CREATININE 1.03 (H) 03/14/2015   BUN 6 03/08/2015   CO2 29 03/08/2015   TSH 1.91 01/27/2013   INR 1.07 02/02/2013   HGBA1C 5.5 09/20/2013    Dg Chest 2 View  Result Date: 12/27/2015 CLINICAL DATA:  Cough and shortness of breath. EXAM: CHEST  2 VIEW COMPARISON:  02/03/2015 FINDINGS: Stable heart size and mediastinal contours, distorted by scoliosis and rotation. No cardiomegaly. There is no edema, consolidation, effusion, or pneumothorax. Spondylosis, exaggerated kyphosis, and dextroscoliosis. No acute osseous finding. Cholecystectomy clips. IMPRESSION: Stable from prior.  No evidence of acute disease. Electronically Signed  By: Monte Fantasia M.D.   On: 12/27/2015 12:20    Assessment & Plan:   There are no diagnoses linked to this encounter. I have discontinued Ms. Smaldone guaiFENesin-dextromethorphan, predniSONE, and oxyCODONE-acetaminophen. I am also having her maintain her Vitamin D, aspirin, ferrous sulfate, furosemide, NONFORMULARY OR COMPOUNDED ITEM, cetirizine, Vitamin B-12, mometasone, albuterol, potassium chloride, albuterol, fluticasone furoate-vilanterol, ranitidine, naproxen, benzonatate, valsartan-hydrochlorothiazide, omeprazole, promethazine-codeine, montelukast, and methocarbamol. We will stop administering methylPREDNISolone acetate.  Meds ordered this encounter  Medications  . methocarbamol (ROBAXIN) 500 MG tablet    Sig: Take 500 mg by mouth every 8 (eight) hours as needed for muscle spasms. Muscle spasms     Follow-up: No Follow-up on file.  Walker Kehr, MD

## 2016-04-16 NOTE — Progress Notes (Signed)
Pre-visit discussion using our clinic review tool. No additional management support is needed unless otherwise documented below in the visit note.  

## 2016-04-16 NOTE — Assessment & Plan Note (Signed)
f/u w/Onc

## 2016-04-16 NOTE — Patient Instructions (Signed)
Use over-the-counter  "cold" medicines  such as  "Tylenol" or "Advil" for fever, chills and achyness. Use Halls or Ricola cough drops.   "Common cold" symptoms are usually triggered by a virus.  The antibiotics are usually not necessary. On average, a" viral cold" illness would take 4-7 days to resolve. Please, make an appointment if you are not better or if you're worse.

## 2016-04-16 NOTE — Assessment & Plan Note (Signed)
Doing better.   

## 2016-04-16 NOTE — Assessment & Plan Note (Signed)
Doing well 

## 2016-04-16 NOTE — Assessment & Plan Note (Signed)
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

## 2016-04-16 NOTE — Assessment & Plan Note (Signed)
Diovan HCT Furosemide

## 2016-04-16 NOTE — Assessment & Plan Note (Signed)
Wt Readings from Last 3 Encounters:  04/16/16 225 lb 12 oz (102.4 kg)  03/12/16 225 lb 14.4 oz (102.5 kg)  01/15/16 222 lb (100.7 kg)

## 2016-04-16 NOTE — Assessment & Plan Note (Signed)
Z pac if worse Prom-cod syr

## 2016-05-17 ENCOUNTER — Other Ambulatory Visit: Payer: Self-pay | Admitting: *Deleted

## 2016-05-17 MED ORDER — RANITIDINE HCL 150 MG PO TABS: 150.0000 mg | ORAL_TABLET | Freq: Every day | ORAL | 5 refills | Status: DC

## 2016-05-30 ENCOUNTER — Telehealth: Payer: Self-pay | Admitting: Internal Medicine

## 2016-05-30 MED ORDER — BENZONATATE 200 MG PO CAPS: 200.0000 mg | ORAL_CAPSULE | Freq: Three times a day (TID) | ORAL | 0 refills | Status: DC | PRN

## 2016-05-30 MED ORDER — DOXYCYCLINE HYCLATE 100 MG PO TABS: ORAL_TABLET | ORAL | 0 refills | Status: DC

## 2016-05-30 NOTE — Telephone Encounter (Signed)
Called and spoke to pt. Informed her of the recs per CY. Rx sent to preferred pharmacy. Pt verbalized understanding and denied any further questions or concerns at this time.   

## 2016-05-30 NOTE — Telephone Encounter (Signed)
Offer doxycycline 100 mg, # 8, 2 today then one daily  Offer Tessalon perles 200 mg, # 30, 1 every 8 hours if needed for cough It may help to also take Mucinex-DM otc with this.

## 2016-05-30 NOTE — Telephone Encounter (Signed)
Pt c/o runny nose, pnd, sore nonprod cough, sore throat X2 days.  Cough is worse qhs, keeping pt from sleeping at night.   Denies fever, chills, chest congestion.  Pt has not taken anything aside from maintenance meds.  Requesting recs.    Pt uses [facility] in [place] please advise on further recs.  Thanks.

## 2016-07-18 ENCOUNTER — Telehealth: Payer: Self-pay | Admitting: Internal Medicine

## 2016-07-18 MED ORDER — DOXYCYCLINE HYCLATE 100 MG PO TABS: ORAL_TABLET | ORAL | 0 refills | Status: DC

## 2016-07-18 NOTE — Telephone Encounter (Signed)
Called spoke with patient and discussed CY's recommendations Pt voiced her understanding and denied any further questions/concerns Pt aware to call the office or seek emergency assistance for persistent or worsening symptoms Rx sent to verified pharmacy Nothing further needed; will sign off

## 2016-07-18 NOTE — Telephone Encounter (Signed)
Offer doxycycline 100 mg, # 8, 2 today then one daily  Suggest otc Mucinex-DM might help

## 2016-07-18 NOTE — Telephone Encounter (Addendum)
Spoke with pt, who reports of non prod cough, nasal drainage clear in color & watery eyes 3-4d Denies fever, chills or sweats. Pt taken mucinex with no improvement.  Pt requesting Rx to help with symptoms. Preferred pharmacy is [facility] in [place].  CY please advise. Thanks.  Current Outpatient Prescriptions on File Prior to Visit  Medication Sig Dispense Refill  . albuterol (PROAIR HFA) 108 (90 Base) MCG/ACT inhaler INHALE 2 PUFFS INTO THE LUNGS BY MOUTH EVERY 6 HOURS AS NEEDED FOR WHEEZING/SHORTNESS OF BREATH 25.5 each 11  . albuterol (PROVENTIL) (2.5 MG/3ML) 0.083% nebulizer solution Take 3 mLs (2.5 mg total) by nebulization every 6 (six) hours as needed for wheezing or shortness of breath. 120 mL 12  . aspirin 81 MG tablet Take 81 mg by mouth daily.     Marland Kitchen azithromycin (ZITHROMAX) 250 MG tablet As directed 6 tablet 0  . benzonatate (TESSALON) 100 MG capsule Take 1 capsule (100 mg total) by mouth 3 (three) times daily as needed for cough. 20 capsule 0  . benzonatate (TESSALON) 200 MG capsule Take 1 capsule (200 mg total) by mouth every 8 (eight) hours as needed for cough. 30 capsule 0  . cetirizine (ZYRTEC) 10 MG tablet Take 1 tablet (10 mg total) by mouth daily. 100 tablet 3  . Cholecalciferol (VITAMIN D) 1000 UNITS capsule Take 1,000 Units by mouth every morning.     . Cyanocobalamin (VITAMIN B-12) 1000 MCG SUBL Place 1 tablet (1,000 mcg total) under the tongue daily. 100 tablet 3  . doxycycline (VIBRA-TABS) 100 MG tablet Take 2 tablets today then 1 tablet daily till gone. 8 tablet 0  . ferrous sulfate 325 (65 FE) MG tablet Take 325 mg by mouth 3 (three) times daily with meals.     . fluticasone furoate-vilanterol (BREO ELLIPTA) 100-25 MCG/INH AEPB Inhale 1 puff, then rinse mouth, once daily 60 each 12  . furosemide (LASIX) 20 MG tablet Take 20-40 mg by mouth daily as needed for fluid.    . methocarbamol (ROBAXIN) 500 MG tablet Take 500 mg by mouth every 8 (eight) hours as needed for muscle  spasms. Muscle spasms    . mometasone (NASONEX) 50 MCG/ACT nasal spray Place 2 sprays into the nose daily as needed (allergies). 17 g 11  . montelukast (SINGULAIR) 10 MG tablet TAKE ONE TABLET BY MOUTH ONCE DAILY AT BEDTIME 90 tablet 3  . naproxen (NAPROSYN) 500 MG tablet Take 1 tablet (500 mg total) by mouth 2 (two) times daily as needed. 60 tablet 1  . NONFORMULARY OR COMPOUNDED ITEM Inject 1 Applicatorful as directed once a week. Allergy Vaccine 1:10 Given at [facility]     . omeprazole (PRILOSEC) 20 MG capsule Take 1 capsule (20 mg total) by mouth daily. 90 capsule 3  . potassium chloride (K-DUR) 10 MEQ tablet Take 1 tablet (10 mEq total) by mouth daily. 90 tablet 3  . promethazine-codeine (PHENERGAN WITH CODEINE) 6.25-10 MG/5ML syrup Take 5 mLs by mouth every 6 (six) hours as needed for cough. 300 mL 0  . ranitidine (ZANTAC) 150 MG tablet Take 1 tablet (150 mg total) by mouth at bedtime. 30 tablet 5  . valsartan-hydrochlorothiazide (DIOVAN-HCT) 160-25 MG tablet Take 1 tablet by mouth daily. 90 tablet 3   No current facility-administered medications on file prior to visit.     No Known Allergies

## 2016-07-24 ENCOUNTER — Telehealth: Payer: Self-pay | Admitting: Internal Medicine

## 2016-07-24 NOTE — Telephone Encounter (Signed)
Spoke with pt, aware of recs.  I offered acute ov's with our clinic to her but pt declined d/t lack of transportation.  I urged pt to be seen, states that her PCP is closer to her (lives in [place]) and could get a ride easier to her PCP than to here.  I advised pt to be seen, and urged her to call back so we could schedule an appt if she is unable to get in with her PCP.  Pt expressed understanding.  Will close encounter.

## 2016-07-24 NOTE — Telephone Encounter (Signed)
Spoke with pt, states she has 1 more day of doxycycline given last week and her s/s are still present- c/o nonprod cough throughout day, sinus congestion, PND, runny nose, watery eyes (L side worse than R), fatigue.  Pt has also been taking mucinex, which has not been helping with sinus congestion.  Pt is requesting further recs.  Uses [facility] in [place].  Sending to DOD as CY is unavailable this week.   JN please advise on further recs.  Thanks!

## 2016-07-24 NOTE — Telephone Encounter (Signed)
Patient needs to be seen by any available provider or nurse practitioner this week to address her ongoing symptoms. Thank you.

## 2016-08-06 ENCOUNTER — Encounter: Payer: Self-pay | Admitting: Internal Medicine

## 2016-08-06 ENCOUNTER — Ambulatory Visit (INDEPENDENT_AMBULATORY_CARE_PROVIDER_SITE_OTHER): Payer: Medicare HMO | Admitting: Internal Medicine

## 2016-08-06 DIAGNOSIS — R600 Localized edema: Secondary | ICD-10-CM

## 2016-08-06 DIAGNOSIS — I10 Essential (primary) hypertension: Secondary | ICD-10-CM | POA: Diagnosis not present

## 2016-08-06 DIAGNOSIS — R609 Edema, unspecified: Secondary | ICD-10-CM | POA: Insufficient documentation

## 2016-08-06 DIAGNOSIS — G8929 Other chronic pain: Secondary | ICD-10-CM | POA: Diagnosis not present

## 2016-08-06 DIAGNOSIS — D510 Vitamin B12 deficiency anemia due to intrinsic factor deficiency: Secondary | ICD-10-CM

## 2016-08-06 DIAGNOSIS — K219 Gastro-esophageal reflux disease without esophagitis: Secondary | ICD-10-CM

## 2016-08-06 DIAGNOSIS — M545 Low back pain: Secondary | ICD-10-CM

## 2016-08-06 MED ORDER — HYDROCODONE-ACETAMINOPHEN 5-325 MG PO TABS: 1.0000 | ORAL_TABLET | Freq: Two times a day (BID) | ORAL | 0 refills | Status: DC | PRN

## 2016-08-06 MED ORDER — FUROSEMIDE 40 MG PO TABS: 40.0000 mg | ORAL_TABLET | Freq: Every day | ORAL | 3 refills | Status: DC | PRN

## 2016-08-06 NOTE — Assessment & Plan Note (Addendum)
Norco prn 

## 2016-08-06 NOTE — Progress Notes (Signed)
Subjective:  Patient ID: [name], female    DOB: Dec 08, 1950  Age: 66 y.o. MRN: [id number]  CC: No chief complaint on file.   HPI Danaye R Revak presents for LBP, HTN, GERD f/u C/o B feet swelling  Outpatient Medications Prior to Visit  Medication Sig Dispense Refill  . albuterol (PROAIR HFA) 108 (90 Base) MCG/ACT inhaler INHALE 2 PUFFS INTO THE LUNGS BY MOUTH EVERY 6 HOURS AS NEEDED FOR WHEEZING/SHORTNESS OF BREATH 25.5 each 11  . albuterol (PROVENTIL) (2.5 MG/3ML) 0.083% nebulizer solution Take 3 mLs (2.5 mg total) by nebulization every 6 (six) hours as needed for wheezing or shortness of breath. 120 mL 12  . aspirin 81 MG tablet Take 81 mg by mouth daily.     . cetirizine (ZYRTEC) 10 MG tablet Take 1 tablet (10 mg total) by mouth daily. 100 tablet 3  . Cholecalciferol (VITAMIN D) 1000 UNITS capsule Take 1,000 Units by mouth every morning.     . Cyanocobalamin (VITAMIN B-12) 1000 MCG SUBL Place 1 tablet (1,000 mcg total) under the tongue daily. 100 tablet 3  . ferrous sulfate 325 (65 FE) MG tablet Take 325 mg by mouth 3 (three) times daily with meals.     . fluticasone furoate-vilanterol (BREO ELLIPTA) 100-25 MCG/INH AEPB Inhale 1 puff, then rinse mouth, once daily 60 each 12  . furosemide (LASIX) 20 MG tablet Take 20-40 mg by mouth daily as needed for fluid.    . methocarbamol (ROBAXIN) 500 MG tablet Take 500 mg by mouth every 8 (eight) hours as needed for muscle spasms. Muscle spasms    . mometasone (NASONEX) 50 MCG/ACT nasal spray Place 2 sprays into the nose daily as needed (allergies). 17 g 11  . montelukast (SINGULAIR) 10 MG tablet TAKE ONE TABLET BY MOUTH ONCE DAILY AT BEDTIME 90 tablet 3  . naproxen (NAPROSYN) 500 MG tablet Take 1 tablet (500 mg total) by mouth 2 (two) times daily as needed. 60 tablet 1  . NONFORMULARY OR COMPOUNDED ITEM Inject 1 Applicatorful as directed once a week. Allergy Vaccine 1:10 Given at [facility]     . omeprazole (PRILOSEC) 20  MG capsule Take 1 capsule (20 mg total) by mouth daily. 90 capsule 3  . potassium chloride (K-DUR) 10 MEQ tablet Take 1 tablet (10 mEq total) by mouth daily. 90 tablet 3  . promethazine-codeine (PHENERGAN WITH CODEINE) 6.25-10 MG/5ML syrup Take 5 mLs by mouth every 6 (six) hours as needed for cough. 300 mL 0  . ranitidine (ZANTAC) 150 MG tablet Take 1 tablet (150 mg total) by mouth at bedtime. 30 tablet 5  . valsartan-hydrochlorothiazide (DIOVAN-HCT) 160-25 MG tablet Take 1 tablet by mouth daily. 90 tablet 3  . azithromycin (ZITHROMAX) 250 MG tablet As directed 6 tablet 0  . benzonatate (TESSALON) 100 MG capsule Take 1 capsule (100 mg total) by mouth 3 (three) times daily as needed for cough. 20 capsule 0  . benzonatate (TESSALON) 200 MG capsule Take 1 capsule (200 mg total) by mouth every 8 (eight) hours as needed for cough. 30 capsule 0  . doxycycline (VIBRA-TABS) 100 MG tablet Take 2 tablets today then 1 tablet daily till gone. 8 tablet 0  . doxycycline (VIBRA-TABS) 100 MG tablet Take 2 today then 1 daily until gone. 8 tablet 0   No facility-administered medications prior to visit.     ROS Review of Systems  Constitutional: Positive for unexpected weight change. Negative for activity change, appetite change, chills and fatigue.  HENT: Negative for congestion, mouth sores and sinus pressure.   Eyes: Negative for visual disturbance.  Respiratory: Negative for cough and chest tightness.   Cardiovascular: Positive for leg swelling.  Gastrointestinal: Negative for abdominal pain and nausea.  Genitourinary: Negative for difficulty urinating, frequency and vaginal pain.  Musculoskeletal: Positive for back pain. Negative for gait problem.  Skin: Negative for pallor and rash.  Neurological: Negative for dizziness, tremors, weakness, numbness and headaches.  Psychiatric/Behavioral: Negative for confusion and sleep disturbance.    Objective:  BP 120/72 (BP Location: Left Arm, Patient Position:  Sitting, Cuff Size: Large)   Pulse 76   Temp 97.8 F (36.6 C) (Oral)   Ht 5\' 2"  (1.575 m)   Wt 228 lb (103.4 kg)   SpO2 94%   BMI 41.70 kg/m   BP Readings from Last 3 Encounters:  08/06/16 120/72  04/16/16 122/86  03/12/16 132/80    Wt Readings from Last 3 Encounters:  08/06/16 228 lb (103.4 kg)  04/16/16 225 lb 12 oz (102.4 kg)  03/12/16 225 lb 14.4 oz (102.5 kg)    Physical Exam  Constitutional: She appears well-developed. No distress.  HENT:  Head: Normocephalic.  Right Ear: External ear normal.  Left Ear: External ear normal.  Nose: Nose normal.  Mouth/Throat: Oropharynx is clear and moist.  Eyes: Conjunctivae are normal. Pupils are equal, round, and reactive to light. Right eye exhibits no discharge. Left eye exhibits no discharge.  Neck: Normal range of motion. Neck supple. No JVD present. No tracheal deviation present. No thyromegaly present.  Cardiovascular: Normal rate, regular rhythm and normal heart sounds.   Pulmonary/Chest: No stridor. No respiratory distress. She has no wheezes.  Abdominal: Soft. Bowel sounds are normal. She exhibits no distension and no mass. There is no tenderness. There is no rebound and no guarding.  Musculoskeletal: She exhibits edema and tenderness.  Lymphadenopathy:    She has no cervical adenopathy.  Neurological: She displays normal reflexes. No cranial nerve deficit. She exhibits normal muscle tone. Coordination normal.  Skin: No rash noted. No erythema.  Psychiatric: She has a normal mood and affect. Her behavior is normal. Judgment and thought content normal.  LS tender Trace edema B ankles  Lab Results  Component Value Date   WBC 6.3 03/12/2016   HGB 12.0 03/12/2016   HCT 37.2 03/12/2016   PLT 292 03/12/2016   GLUCOSE 106 (H) 03/08/2015   CHOL 144 07/14/2012   TRIG 59.0 07/14/2012   HDL 76.60 07/14/2012   LDLCALC 56 07/14/2012   ALT 18 02/03/2015   AST 20 02/03/2015   NA 143 03/08/2015   K 4.0 03/08/2015   CL 108  03/08/2015   CREATININE 1.03 (H) 03/14/2015   BUN 6 03/08/2015   CO2 29 03/08/2015   TSH 1.91 01/27/2013   INR 1.07 02/02/2013   HGBA1C 5.5 09/20/2013    Dg Chest 2 View  Result Date: 12/27/2015 CLINICAL DATA:  Cough and shortness of breath. EXAM: CHEST  2 VIEW COMPARISON:  02/03/2015 FINDINGS: Stable heart size and mediastinal contours, distorted by scoliosis and rotation. No cardiomegaly. There is no edema, consolidation, effusion, or pneumothorax. Spondylosis, exaggerated kyphosis, and dextroscoliosis. No acute osseous finding. Cholecystectomy clips. IMPRESSION: Stable from prior.  No evidence of acute disease. Electronically Signed   By: Monte Fantasia M.D.   On: 12/27/2015 12:20    Assessment & Plan:   There are no diagnoses linked to this encounter. I have discontinued Ms. Lopata benzonatate, azithromycin, doxycycline, benzonatate, and doxycycline.  I am also having her maintain her Vitamin D, aspirin, ferrous sulfate, furosemide, NONFORMULARY OR COMPOUNDED ITEM, cetirizine, Vitamin B-12, mometasone, albuterol, potassium chloride, albuterol, fluticasone furoate-vilanterol, naproxen, valsartan-hydrochlorothiazide, omeprazole, montelukast, methocarbamol, promethazine-codeine, and ranitidine.  No orders of the defined types were placed in this encounter.    Follow-up: No Follow-up on file.  Walker Kehr, MD

## 2016-08-06 NOTE — Assessment & Plan Note (Signed)
Labs

## 2016-08-06 NOTE — Assessment & Plan Note (Addendum)
Furosemide prn - increase dose Compr socks

## 2016-08-06 NOTE — Assessment & Plan Note (Signed)
Diovan HCT, Furosemide prn - increase dose, KCl

## 2016-08-06 NOTE — Assessment & Plan Note (Signed)
Omeprazole, Zantac

## 2016-08-09 ENCOUNTER — Other Ambulatory Visit: Payer: Self-pay | Admitting: Internal Medicine

## 2016-08-27 ENCOUNTER — Telehealth: Payer: Self-pay | Admitting: Internal Medicine

## 2016-08-27 NOTE — Telephone Encounter (Signed)
Called and spoke to pt. Pt c/o dry cough to point of emesis x 1 week. Pt denies SOB, CP/tightness, f/c/s. Pt states she has tried to take Robitussin and Delsym but it has not helped. Spoke with Joellen Jersey, she suggests pt come in. Appt made with CY on 08/28/2016 at 1130. Pt verbalized understanding and denied any further questions or concerns at this time.

## 2016-08-28 ENCOUNTER — Encounter: Payer: Self-pay | Admitting: Internal Medicine

## 2016-08-28 ENCOUNTER — Ambulatory Visit (INDEPENDENT_AMBULATORY_CARE_PROVIDER_SITE_OTHER): Payer: Medicare HMO | Admitting: Internal Medicine

## 2016-08-28 VITALS — BP 128/80 | HR 62 | Ht 62.0 in | Wt 227.2 lb

## 2016-08-28 DIAGNOSIS — J309 Allergic rhinitis, unspecified: Secondary | ICD-10-CM | POA: Diagnosis not present

## 2016-08-28 DIAGNOSIS — J4520 Mild intermittent asthma, uncomplicated: Secondary | ICD-10-CM

## 2016-08-28 DIAGNOSIS — G4733 Obstructive sleep apnea (adult) (pediatric): Secondary | ICD-10-CM | POA: Diagnosis not present

## 2016-08-28 DIAGNOSIS — R05 Cough: Secondary | ICD-10-CM | POA: Diagnosis not present

## 2016-08-28 DIAGNOSIS — J302 Other seasonal allergic rhinitis: Secondary | ICD-10-CM | POA: Diagnosis not present

## 2016-08-28 DIAGNOSIS — J3089 Other allergic rhinitis: Secondary | ICD-10-CM | POA: Diagnosis not present

## 2016-08-28 DIAGNOSIS — R059 Cough, unspecified: Secondary | ICD-10-CM

## 2016-08-28 MED ORDER — AZELASTINE-FLUTICASONE 137-50 MCG/ACT NA SUSP: 2.0000 | Freq: Every day | NASAL | 0 refills | Status: DC

## 2016-08-28 NOTE — Assessment & Plan Note (Signed)
She describes good compliance and control with CPAP and says the pressure is comfortable. She feels she sleeps better with it. No download available today.

## 2016-08-28 NOTE — Assessment & Plan Note (Signed)
Sneezing worse around lawn mowing. She believes she is worse in the last 6 or 7 months and she stopped allergy shots. Plan-referral to allergist for reevaluation. Sample Dymista nasal spray.

## 2016-08-28 NOTE — Progress Notes (Signed)
Subjective:    Patient ID: [name], female    DOB: 04-Jan-1951, 66 y.o.   MRN: [id number]  HPI F never smoker followed for asthma, allergic rhinitis,, OSA, obesity/hypoventilation, complicated by GERD, arthritis, HBP, Colon Ca/ L hemicolectomy NPSG 12/12/05-AHI 38.4/hour, desaturation to 66%, body weight 250 pounds  ---------------------------------------------------------------------------  09/14/2015-66 year old female never smoker followed for asthma, allergic rhinitis, OSA, obesity/hypoventilation, complicated by GERD, arthritis, HBP, colon cancer/left hemicolectomy Allergy Vaccine 1:10 GH CPAP 9/Advanced FOLLOWS FOR: DME: [facility]:pt wears CPAP nightly;DL attached. Continues allergy injections weekly and denies any issues at this time. Download shows excellent compliance and control, 100% over 4 hours per night with AHI 0.8/hour. She says she definitely sleeps better and feels better with CPAP. Denies any recent nasal congestion or drainage, wheeze or cough. She has not used her rescue inhaler in several months.  08/28/16- 66 year old female never smoker followed for asthma, allergic rhinitis, OSA, obesity/hypoventilation, complicated by GERD, arthritis, HBP, colon cancer/left hemicolectomy CPAP 9/Advanced ACUTE VISIT: Pt has noted cough that is not getting any better-worse at night and happens until she vomits. Pt has noted difference since being off Allergy vaccine- program ended December, 2017. Breo 100, neb albuterol, ProAir, Singulair, omeprazole/ zantac She complains left eye and nose run. Denies sinus pressure/pain. Dry cough is persistent with occasional wheeze. Cough cleared when she went to [place] over Fourth of July and resume when she returned. Son and mother live in her home and did not have cough problem. She avoids grass cutting because it causes sneeze. Inhalers do not help enough. She says she was better off while on allergy vaccine. Denies recognizing reflux or  heartburn as she continues twice daily acid blocker. Cough is worse in the evening but not while lying down. Continues to use CPAP all night every night. No download available this visit. She believes she sleeps better with it.  ROS-see HPI Constitutional:   No-   weight loss, night sweats, fevers, chills, fatigue, lassitude. HEENT:   No-  headaches, difficulty swallowing, tooth/dental problems, sore throat,       No-  sneezing, itching, ear ache, nasal congestion, + post nasal drip,  CV:  No-   chest pain, orthopnea, PND, swelling in lower extremities, anasarca, dizziness, palpitations Resp: No- acute  shortness of breath with exertion or at rest.              No-   productive cough,  + non-productive cough,  No- coughing up of blood.              No-   change in color of mucus.  No-sustained wheezing.   Skin: No-   rash or lesions. GI:  + heartburn, indigestion, no-abdominal pain, nausea, vomiting,  GU: No  complaint MS:  No-   joint pain or swelling.   Neuro-     nothing unusual Psych:  No- change in mood or affect. No depression or anxiety.  No memory loss.  OBJ- Physical Exam   similar to previous exams General- Alert, Oriented, Affect-appropriate, Distress- none acute, +obese Skin- rash-none, lesions- none, excoriation- none Lymphadenopathy- none Head- atraumatic            Eyes- Gross vision intact, PERRLA, conjunctivae and secretions clear            Ears- Hearing, canals-normal            Nose- Clear, no-Septal dev, mucus, polyps, erosion, perforation             Throat-  Mallampati II , mucosa clear,not dry , drainage- none, tonsils- atrophic, + hoarse Neck- flexible , trachea midline, no stridor , thyroid nl, carotid no bruit Chest - symmetrical excursion , unlabored           Heart/CV- RRR , no murmur , no gallop  , no rub, nl s1 s2                           - JVD- none , edema- none, stasis changes- none, varices- none           Lung- clear to P&A, wheeze- none, cough-none  , dullness-none, rub- none           Chest wall-  Abd-  Br/ Gen/ Rectal- Not done, not indicated Extrem- cyanosis- none, clubbing, none, atrophy- none, strength- nl Neuro- grossly intact to observation

## 2016-08-28 NOTE — Patient Instructions (Signed)
We can continue CPAP 9/ Advanced, mask of choice, humidifier supplies, AirView  Sample Dymista nasal spray for allergy      Try 1-2 puffs each nostril every evening  Order- referral to [facility] on [address] for allergic rhinitis and cough  Please call as needed

## 2016-08-28 NOTE — Assessment & Plan Note (Signed)
Cough is likely related to her asthma. She was hoping I would give her a cough syrup, but I suggested she try Dymista nasal spray to see if that would help first. We also reinforced reflux precaution. Reviewed use of her inhaled medications. Consider additional evaluation, including CXR, if cough persists.

## 2016-09-05 ENCOUNTER — Telehealth: Payer: Self-pay | Admitting: Internal Medicine

## 2016-09-05 MED ORDER — IRBESARTAN-HYDROCHLOROTHIAZIDE 150-12.5 MG PO TABS: 1.0000 | ORAL_TABLET | Freq: Every day | ORAL | 3 refills | Status: DC

## 2016-09-05 NOTE — Telephone Encounter (Signed)
Will start Irbesartan HCT instead Thx

## 2016-09-05 NOTE — Telephone Encounter (Signed)
Spoke to patient informing of med change.

## 2016-09-05 NOTE — Telephone Encounter (Signed)
Pt called regarding her valsartan-hydrochlorothiazide (DIOVAN-HCT) 160-25 MG tablet  It has been recalled and she would like something else called in  Last seen 07/2016 Please advise

## 2016-09-10 ENCOUNTER — Telehealth: Payer: Self-pay | Admitting: Oncology

## 2016-09-10 ENCOUNTER — Ambulatory Visit (HOSPITAL_BASED_OUTPATIENT_CLINIC_OR_DEPARTMENT_OTHER): Payer: Medicare HMO | Admitting: Oncology

## 2016-09-10 ENCOUNTER — Other Ambulatory Visit (HOSPITAL_BASED_OUTPATIENT_CLINIC_OR_DEPARTMENT_OTHER): Payer: Medicare HMO

## 2016-09-10 ENCOUNTER — Telehealth: Payer: Self-pay | Admitting: Internal Medicine

## 2016-09-10 VITALS — BP 126/75 | HR 56 | Temp 97.7°F | Resp 18 | Ht 62.0 in | Wt 227.8 lb

## 2016-09-10 DIAGNOSIS — C184 Malignant neoplasm of transverse colon: Secondary | ICD-10-CM

## 2016-09-10 DIAGNOSIS — Z85038 Personal history of other malignant neoplasm of large intestine: Secondary | ICD-10-CM | POA: Diagnosis not present

## 2016-09-10 LAB — CEA (IN HOUSE-CHCC): CEA (CHCC-In House): 1.46 ng/mL (ref 0.00–5.00)

## 2016-09-10 NOTE — Telephone Encounter (Signed)
Lm for patient I do not see where a nurse called but I do see a referral to Dr Ernst Bowler- [facility] placed on 08/28/16

## 2016-09-10 NOTE — Telephone Encounter (Signed)
Gave patient avs and calendar for upcoming appts.  °

## 2016-09-10 NOTE — Progress Notes (Signed)
  [facility] OFFICE PROGRESS NOTE   Diagnosis: Colon cancer  INTERVAL HISTORY:   [name] returns as scheduled. She feels well. Good appetite. No difficulty with bowel function. No complaint.  Objective:  Vital signs in last 24 hours:  Blood pressure 126/75, pulse (!) 56, temperature 97.7 F (36.5 C), temperature source Oral, resp. rate 18, height _0  (1.575 m), weight 227 lb 12.8 oz (103.3 kg), SpO2 100 %.    HEENT: Neck without mass Lymphatics: No cervical, supraclavicular, axillary, or inguinal nodes Resp: Lungs clear bilaterally Cardio: Regular rate and rhythm GI: No hepatosplenomegaly, nontender, no mass Vascular: No leg edema   Lab Results:  Lab Results  Component Value Date   WBC 6.3 03/12/2016   HGB 12.0 03/12/2016   HCT 37.2 03/12/2016   MCV 74.6 (L) 03/12/2016   PLT 292 03/12/2016   NEUTROABS 4.4 03/12/2016      Lab Results  Component Value Date   CEA1 1.46 09/10/2016     Medications: I have reviewed the patient's current medications.  Assessment/plan:  1. 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   Presentation to the emergency room 02/02/2013 with a colonic obstruction secondary to tumor at the splenic flexure.   Baseline CEA on 02/02/2013 less than 0.5.   Initiation of adjuvant Xeloda 04/10/2013.   Cycle 2 adjuvant Xeloda 05/01/2013.   Cycle 3 adjuvant Xeloda 05/22/2013.   Cycle 4 adjuvant Xeloda 06/12/2013.   Cycle 5 adjuvant Xeloda 07/03/2013 (Xeloda dose reduced due to hand foot syndrome).   Cycle 6 adjuvant Xeloda 07/24/2013.   Cycle 7 adjuvant Xeloda 08/14/2013   Cycle 8 adjuvant Xeloda 09/07/2013.  2. History of iron deficiency  anemia. Recurrent anemia 02/01/2014, improved, Persistent red cell microcytosis 3. Asthma. 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 5. Status post ostomy reversal 11/25/2013. 6. CT 02/01/2014 with no evidence of local tumor recurrence or metastatic disease. New area of masslike thickening and small bowel dilatation at the mid small bowel  Status post deep enteroscopy at [facility] on 03/16/2014, negative.  Status post capsule endoscopy 03/22/2014, confirmed a small bowel tumor  Exploratory laparotomy with resection of a small bowel mass on 04/06/2014 with the pathology confirming invasive adenocarcinoma extending through small bowel wall and involving adjacent loops of adherent small bowel, 2 of 12 lymph nodes positive. History consistent with recurrent colon cancer.Loss of MLH1 and PMS 2, MSI high as was the January 2015 tumor   Disposition:  [name] remains in clinical remission from colon cancer. She will return for an office visit in 6 months. I referred her to Dr. Carlean Purl to consider a surveillance colonoscopy. The plan is to follow her clinical status and obtain reimaging studies for new symptoms.  She will decrease the ferrous sulfate twice daily.  15 minutes were spent with the patient today. The majority of the time was used for counseling and coordination of care.  Donneta Romberg, MD  09/10/2016  1:32 PM

## 2016-09-12 NOTE — Telephone Encounter (Signed)
Called and spoke to pt. There was a referral placed to GI and they called on 8/14. Advised pt to call GI back. Pt verbalized understanding and denied any further questions or concerns at this time.

## 2016-09-17 ENCOUNTER — Ambulatory Visit: Payer: Medicare HMO | Admitting: Internal Medicine

## 2016-09-25 ENCOUNTER — Other Ambulatory Visit: Payer: Self-pay | Admitting: Internal Medicine

## 2016-09-27 ENCOUNTER — Other Ambulatory Visit: Payer: Self-pay | Admitting: Internal Medicine

## 2016-09-27 MED ORDER — FLUTICASONE FUROATE-VILANTEROL 100-25 MCG/INH IN AEPB: INHALATION_SPRAY | RESPIRATORY_TRACT | 12 refills | Status: DC

## 2016-09-27 NOTE — Telephone Encounter (Signed)
lmomtcb x 1 for the pt.  Looks like the breo rx was sent to her pharmacy on 8/28.

## 2016-09-27 NOTE — Telephone Encounter (Signed)
Informed patient RX was sent on 09/24/2016. Patient stated per pharmacy they did not receive it. Patient will contact pharmacy again.

## 2016-09-27 NOTE — Telephone Encounter (Signed)
Pt still needs refill of Breo, last refill the pharmacy told her they had was from 08/2015 Rx sent to [facility] in [place], [place] Nothing further needed.

## 2016-10-04 ENCOUNTER — Telehealth: Payer: Self-pay | Admitting: Internal Medicine

## 2016-10-04 MED ORDER — AZELASTINE-FLUTICASONE 137-50 MCG/ACT NA SUSP: 2.0000 | Freq: Every day | NASAL | 1 refills | Status: DC

## 2016-10-04 NOTE — Telephone Encounter (Signed)
Spoke with patient. She stated that she has had a dry cough with clear nasal drainage for the past 2 days. The cough is keeping her up at night. Due to the cough, she has been feeling nauseous.   Patient wishes to use [facility] in [place].   CY, please advise. Thanks!

## 2016-10-04 NOTE — Telephone Encounter (Signed)
Called and spoke with pt and she is aware of CY recs. dymista has been sent in and nothing further is needed.

## 2016-10-04 NOTE — Telephone Encounter (Signed)
Spoke with Amy at [facility] in [place]. She will fax over the rejection letter.    Left message for patient.

## 2016-10-04 NOTE — Telephone Encounter (Signed)
She has Dymista nasal spray, Zyrtec and her asthma inhalers on her medicine list. She should be using these as directed on the bottle. The nasal spray can be used 1 or 2 puffs in each nostril once or twice daily as needed. She may also use Delsym OTC cough syrup.

## 2016-10-07 ENCOUNTER — Ambulatory Visit (INDEPENDENT_AMBULATORY_CARE_PROVIDER_SITE_OTHER): Payer: Medicare HMO | Admitting: Allergy & Immunology

## 2016-10-07 ENCOUNTER — Encounter: Payer: Self-pay | Admitting: Allergy & Immunology

## 2016-10-07 VITALS — BP 126/76 | HR 72 | Temp 97.7°F | Resp 16 | Ht 61.0 in | Wt 228.6 lb

## 2016-10-07 DIAGNOSIS — J3089 Other allergic rhinitis: Secondary | ICD-10-CM

## 2016-10-07 DIAGNOSIS — J4540 Moderate persistent asthma, uncomplicated: Secondary | ICD-10-CM | POA: Diagnosis not present

## 2016-10-07 DIAGNOSIS — R05 Cough: Secondary | ICD-10-CM | POA: Diagnosis not present

## 2016-10-07 DIAGNOSIS — J302 Other seasonal allergic rhinitis: Secondary | ICD-10-CM

## 2016-10-07 DIAGNOSIS — R059 Cough, unspecified: Secondary | ICD-10-CM

## 2016-10-07 MED ORDER — IPRATROPIUM BROMIDE 0.06 % NA SOLN: 1.0000 | Freq: Four times a day (QID) | NASAL | 5 refills | Status: DC

## 2016-10-07 MED ORDER — LEVOCETIRIZINE DIHYDROCHLORIDE 5 MG PO TABS
5.0000 mg | ORAL_TABLET | Freq: Every evening | ORAL | 5 refills | Status: DC
Start: 2016-10-07 — End: 2017-05-08

## 2016-10-07 NOTE — Patient Instructions (Addendum)
1. Seasonal and perennial allergic rhinitis - Testing today showed: trees, weeds, grasses, molds, dust mites, cat, dog and cockroach - Avoidance measures provided. - Stop the cetirizine. - Continue with Dymista (fluticasone/azelastine) two sprays per nostril 1-2 times daily as needed - Start ipratripium one spray per nostril every 6 hours as needed and Xyzal (levocetirizine) 5mg  tablet once daily - You can use an extra dose of the antihistamine, if needed, for breakthrough symptoms.  - Consider nasal saline rinses 1-2 times daily to remove allergens from the nasal cavities as well as help with mucous clearance (this is especially helpful to do before the nasal sprays are given) - Consider allergy shots as a means of long-term control. - Allergy shots "re-train" and "reset" the immune system to ignore environmental allergens and decrease the resulting immune response to those allergens (sneezing, itchy watery eyes, runny nose, nasal congestion, etc).    - Allergy shots improve symptoms in 75-85% of patients.  - We can discuss more at the next appointment if the medications are not working for you.  2. Moderate persistent asthma, uncomplicated - Lung testing was fairly normal appearing.  - We will not make any medication changes since Dr. Annamaria Boots is managing your breathing. - Daily controller medication(s): Breo 100/25 one puff once daily - Rescue medications: ProAir 4 puffs every 4-6 hours as needed - Asthma control goals:  * Full participation in all desired activities (may need albuterol before activity) * Albuterol use two time or less a week on average (not counting use with activity) * Cough interfering with sleep two time or less a month * Oral steroids no more than once a year * No hospitalizations  3. Return in about 3 months (around 01/06/2017).   Please inform us of any Emergency Department visits, hospitalizations, or changes in symptoms. Call us before going to the ED for  breathing or allergy symptoms since we might be able to fit you in for a sick visit. Feel free to contact us anytime with any questions, problems, or concerns.  It was a pleasure to meet you today! Enjoy the upcoming fall season!  Websites that have reliable patient information: 1. American Academy of Asthma, Allergy, and Immunology: www.aaaai.org 2. Food Allergy Research and Education (FARE): foodallergy.org 3. Mothers of Asthmatics: http://www.asthmacommunitynetwork.org 4. American College of Allergy, Asthma, and Immunology: www.acaai.org    Election Day is coming up on Tuesday, November 6th! Make your voice heard! Register to vote at [email or web address]!     Reducing Pollen Exposure  The American Academy of Allergy, Asthma and Immunology suggests the following steps to reduce your exposure to pollen during allergy seasons.    1. Do not hang sheets or clothing out to dry; pollen may collect on these items. 2. Do not mow lawns or spend time around freshly cut grass; mowing stirs up pollen. 3. Keep windows closed at night.  Keep car windows closed while driving. 4. Minimize morning activities outdoors, a time when pollen counts are usually at their highest. 5. Stay indoors as much as possible when pollen counts or humidity is high and on windy days when pollen tends to remain in the air longer. 6. Use air conditioning when possible.  Many air conditioners have filters that trap the pollen spores. 7. Use a HEPA room air filter to remove pollen form the indoor air you breathe.  Control of Mold Allergen  Mold and fungi can grow on a variety of surfaces provided certain temperature and moisture conditions exist.  Outdoor  molds grow on plants, decaying vegetation and soil.  The major outdoor mold, Alternaria and Cladosporium, are found in very high numbers during hot and dry conditions.  Generally, a late Summer - Fall peak is seen for common outdoor fungal spores.  Rain will temporarily lower outdoor  mold spore count, but counts rise rapidly when the rainy period ends.  The most important indoor molds are Aspergillus and Penicillium.  Dark, humid and poorly ventilated basements are ideal sites for mold growth.  The next most common sites of mold growth are the bathroom and the kitchen.  Outdoor [employer] 1. Use air conditioning and keep windows closed 2. Avoid exposure to decaying vegetation. 3. Avoid leaf raking. 4. Avoid grain handling. 5. Consider wearing a face mask if working in moldy areas.  Indoor Mold Control 1. Maintain humidity below 50%. 2. Clean washable surfaces with 5% bleach solution. 3. Remove sources e.g. contaminated carpets.  Control of Dog or Cat Allergen  Avoidance is the best way to manage a dog or cat allergy. If you have a dog or cat and are allergic to dog or cats, consider removing the dog or cat from the home. If you have a dog or cat but don't want to find it a new home, or if your family wants a pet even though someone in the household is allergic, here are some strategies that may help keep symptoms at bay:  1. Keep the pet out of your bedroom and restrict it to only a few rooms. Be advised that keeping the dog or cat in only one room will not limit the allergens to that room. 2. Don't pet, hug or kiss the dog or cat; if you do, wash your hands with soap and water. 3. High-efficiency particulate air (HEPA) cleaners run continuously in a bedroom or living room can reduce allergen levels over time. 4. Regular use of a high-efficiency vacuum cleaner or a central vacuum can reduce allergen levels. 5. Giving your dog or cat a bath at least once a week can reduce airborne allergen.  Control of House Dust Mite Allergen    House dust mites play a major role in allergic asthma and rhinitis.  They occur in environments with high humidity wherever human skin, the food for dust mites is found. High levels have been detected in dust obtained from mattresses,  pillows, carpets, upholstered furniture, bed covers, clothes and soft toys.  The principal allergen of the house dust mite is found in its feces.  A gram of dust may contain 1,000 mites and 250,000 fecal particles.  Mite antigen is easily measured in the air during house cleaning activities.    1. Encase mattresses, including the box spring, and pillow, in an air tight cover.  Seal the zipper end of the encased mattresses with wide adhesive tape. 2. Wash the bedding in water of 130 degrees Farenheit weekly.  Avoid cotton comforters/quilts and flannel bedding: the most ideal bed covering is the dacron comforter. 3. Remove all upholstered furniture from the bedroom. 4. Remove carpets, carpet padding, rugs, and non-washable window drapes from the bedroom.  Wash drapes weekly or use plastic window coverings. 5. Remove all non-washable stuffed toys from the bedroom.  Wash stuffed toys weekly. 6. Have the room cleaned frequently with a vacuum cleaner and a damp dust-mop.  The patient should not be in a room which is being cleaned and should wait 1 hour after cleaning before going into the room. 7. Close and seal all  heating outlets in the bedroom.  Otherwise, the room will become filled with dust-laden air.  An electric heater can be used to heat the room. 8. Reduce indoor humidity to less than 50%.  Do not use a humidifier.  Control of Cockroach Allergen  Cockroach allergen has been identified as an important cause of acute attacks of asthma, especially in urban settings.  There are fifty-five species of cockroach that exist in the [place], however only three, the [place], [place] species produce allergen that can affect patients with Asthma.  Allergens can be obtained from fecal particles, egg casings and secretions from cockroaches.    1. Remove food sources. 2. Reduce access to water. 3. Seal access and entry points. 4. Spray runways with 0.5-1% Diazinon or Chlorpyrifos 5. Blow  boric acid power under stoves and refrigerator. 6. Place bait stations (hydramethylnon) at feeding sites.
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
## 2016-10-09 ENCOUNTER — Telehealth: Payer: Self-pay

## 2016-10-09 NOTE — Telephone Encounter (Signed)
Patient called and stated that at her visit with you that you told her that she might benefit restarting her allergy injections. Please advise if she would be getting one or two injections so that she may call her insurance company to get price information.

## 2016-10-10 NOTE — Telephone Encounter (Signed)
Patient returned call, and states she did pick up ipratropium from pharmacy.  If need to call patient, CB [phone].  Patient states Dr. Salvatore Marvel is who prescribed this for her.

## 2016-10-10 NOTE — Telephone Encounter (Signed)
She will need two injections. Please have her call her insurance company to check on any co-payments. Then I will order the injections once she confirms that she wants to pursue this.   Salvatore Marvel, MD Allergy and [place] [facility] [place]

## 2016-10-10 NOTE — Telephone Encounter (Signed)
Pt returning call from [place] form this morning.

## 2016-10-10 NOTE — Telephone Encounter (Signed)
lmtcb X1 for pt. Need to know if she needs anything further from our office.

## 2016-10-10 NOTE — Telephone Encounter (Signed)
Called the pharmacy to check status of the medication. The pharmacist stated she p[icked up a nasal spray already so this may ne taken of. I called pt to verify but no answer, advised to call back.

## 2016-10-10 NOTE — Telephone Encounter (Signed)
I have checked the PA folder. This rejection has not been received. [facility] is not open at this time to follow up on this. Triage, please call [facility] and see what medication was rejected.

## 2016-10-10 NOTE — Telephone Encounter (Signed)
Spoke with pt, states she's received her nasal spray and that nothing further is needed.  Will close encounter.

## 2016-10-10 NOTE — Telephone Encounter (Signed)
Called patient. I gave her the CPT codes that she needed. Patient is going to call her insurance,then call us back.

## 2016-11-05 NOTE — Telephone Encounter (Signed)
Called pt. She does want to go through with the shots. I gave her the [place] address to drop off signed consent forms and to schedule an appt 2 weeks from now for her first injection.

## 2016-11-05 NOTE — Telephone Encounter (Signed)
Can we call to see if [name] has decided on allergy shots? I just don't want to drop the ball.   Thanks, Salvatore Marvel, MD [facility] [place] [facility] [place]

## 2016-11-08 ENCOUNTER — Telehealth: Payer: Self-pay

## 2016-11-08 NOTE — Telephone Encounter (Signed)
Per Dr Ernst Bowler Per the last telephone note, she is interested in starting shots. But it does not seem that she has made a start injections appointment.   Lm for pt to call us back

## 2016-11-08 NOTE — Addendum Note (Signed)
Addended by: Valentina Shaggy on: 11/08/2016 11:29 AM   Modules accepted: Orders

## 2016-11-12 ENCOUNTER — Encounter: Payer: Self-pay | Admitting: Internal Medicine

## 2016-11-12 ENCOUNTER — Ambulatory Visit (AMBULATORY_SURGERY_CENTER): Payer: Self-pay | Admitting: *Deleted

## 2016-11-12 VITALS — Ht 62.0 in | Wt 227.6 lb

## 2016-11-12 DIAGNOSIS — Z85038 Personal history of other malignant neoplasm of large intestine: Secondary | ICD-10-CM

## 2016-11-12 NOTE — Progress Notes (Signed)
No egg or soy allergy known to patient  No issues with past sedation with any surgeries  or procedures, no intubation problems  No diet pills per patient No home 02 use per patient  No blood thinners per patient  Pt denies issues with constipation  No A fib or A flutter  EMMI video sent to pt's e mail  Pt. Has no email

## 2016-11-19 ENCOUNTER — Encounter: Payer: Self-pay | Admitting: Internal Medicine

## 2016-11-19 ENCOUNTER — Other Ambulatory Visit: Payer: Self-pay | Admitting: Internal Medicine

## 2016-11-19 ENCOUNTER — Other Ambulatory Visit (INDEPENDENT_AMBULATORY_CARE_PROVIDER_SITE_OTHER): Payer: Medicare HMO

## 2016-11-19 ENCOUNTER — Telehealth: Payer: Self-pay | Admitting: Internal Medicine

## 2016-11-19 ENCOUNTER — Ambulatory Visit (INDEPENDENT_AMBULATORY_CARE_PROVIDER_SITE_OTHER): Payer: Medicare HMO | Admitting: Internal Medicine

## 2016-11-19 ENCOUNTER — Ambulatory Visit (INDEPENDENT_AMBULATORY_CARE_PROVIDER_SITE_OTHER)
Admission: RE | Admit: 2016-11-19 | Discharge: 2016-11-19 | Disposition: A | Discharge status: Home / Self Care | Payer: Medicare HMO | Source: Ambulatory Visit | Attending: Internal Medicine | Admitting: Internal Medicine

## 2016-11-19 VITALS — BP 128/84 | HR 67 | Temp 97.8°F | Ht 62.0 in | Wt 229.0 lb

## 2016-11-19 DIAGNOSIS — Z23 Encounter for immunization: Secondary | ICD-10-CM

## 2016-11-19 DIAGNOSIS — I10 Essential (primary) hypertension: Secondary | ICD-10-CM

## 2016-11-19 DIAGNOSIS — M545 Low back pain: Secondary | ICD-10-CM | POA: Diagnosis not present

## 2016-11-19 DIAGNOSIS — J4520 Mild intermittent asthma, uncomplicated: Secondary | ICD-10-CM

## 2016-11-19 DIAGNOSIS — R7309 Other abnormal glucose: Secondary | ICD-10-CM

## 2016-11-19 DIAGNOSIS — D510 Vitamin B12 deficiency anemia due to intrinsic factor deficiency: Secondary | ICD-10-CM

## 2016-11-19 DIAGNOSIS — G8929 Other chronic pain: Secondary | ICD-10-CM

## 2016-11-19 DIAGNOSIS — C184 Malignant neoplasm of transverse colon: Secondary | ICD-10-CM | POA: Diagnosis not present

## 2016-11-19 LAB — URINALYSIS
BILIRUBIN URINE: NEGATIVE
Hgb urine dipstick: NEGATIVE
KETONES UR: NEGATIVE
Leukocytes, UA: NEGATIVE
Nitrite: NEGATIVE
PH: 6 (ref 5.0–8.0)
Specific Gravity, Urine: 1.015 (ref 1.000–1.030)
TOTAL PROTEIN, URINE-UPE24: NEGATIVE
Urine Glucose: NEGATIVE
Urobilinogen, UA: 0.2 (ref 0.0–1.0)

## 2016-11-19 LAB — CBC WITH DIFFERENTIAL/PLATELET
BASOS ABS: 0.1 10*3/uL (ref 0.0–0.1)
Basophils Relative: 1.3 % (ref 0.0–3.0)
EOS PCT: 2.1 % (ref 0.0–5.0)
Eosinophils Absolute: 0.1 10*3/uL (ref 0.0–0.7)
HCT: 37.4 % (ref 36.0–46.0)
Hemoglobin: 11.9 g/dL — ABNORMAL LOW (ref 12.0–15.0)
LYMPHS ABS: 1.3 10*3/uL (ref 0.7–4.0)
Lymphocytes Relative: 21.3 % (ref 12.0–46.0)
MCHC: 31.8 g/dL (ref 30.0–36.0)
MCV: 74.4 fl — AB (ref 78.0–100.0)
MONOS PCT: 11.3 % (ref 3.0–12.0)
Monocytes Absolute: 0.7 10*3/uL (ref 0.1–1.0)
NEUTROS ABS: 3.8 10*3/uL (ref 1.4–7.7)
NEUTROS PCT: 64 % (ref 43.0–77.0)
PLATELETS: 294 10*3/uL (ref 150.0–400.0)
RBC: 5.03 Mil/uL (ref 3.87–5.11)
RDW: 17.4 % — AB (ref 11.5–15.5)
WBC: 6 10*3/uL (ref 4.0–10.5)

## 2016-11-19 LAB — VITAMIN B12: VITAMIN B 12: 1067 pg/mL — AB (ref 211–911)

## 2016-11-19 LAB — HEPATIC FUNCTION PANEL
ALT: 14 U/L (ref 0–35)
AST: 13 U/L (ref 0–37)
Albumin: 3.9 g/dL (ref 3.5–5.2)
Alkaline Phosphatase: 133 U/L — ABNORMAL HIGH (ref 39–117)
BILIRUBIN TOTAL: 0.5 mg/dL (ref 0.2–1.2)
Bilirubin, Direct: 0.1 mg/dL (ref 0.0–0.3)
Total Protein: 7.1 g/dL (ref 6.0–8.3)

## 2016-11-19 LAB — BASIC METABOLIC PANEL
BUN: 12 mg/dL (ref 6–23)
CALCIUM: 9.5 mg/dL (ref 8.4–10.5)
CO2: 30 meq/L (ref 19–32)
Chloride: 104 mEq/L (ref 96–112)
Creatinine, Ser: 0.87 mg/dL (ref 0.40–1.20)
GFR: 83.41 mL/min (ref >60.00)
GLUCOSE: 102 mg/dL — AB (ref 70–99)
Potassium: 3.8 mEq/L (ref 3.5–5.1)
SODIUM: 142 meq/L (ref 135–145)

## 2016-11-19 NOTE — Assessment & Plan Note (Signed)
[facility] ref offered

## 2016-11-19 NOTE — Telephone Encounter (Signed)
Pt called stating that she is needing ranitidine (ZANTAC) 150 MG tablet, potassium chloride (K-DUR) 10 MEQ tablet and omeprazole (PRILOSEC) 20 MG capsule sent to [facility] in [place].

## 2016-11-19 NOTE — Assessment & Plan Note (Signed)
A1c

## 2016-11-19 NOTE — Progress Notes (Signed)
Subjective:  Patient ID: [name], female    DOB: [birth year]  Age: 66 y.o. MRN: [id number]  CC: No chief complaint on file.   HPI [name] presents for LBP on the R,  Asthma, HTN f/u  Outpatient Medications Prior to Visit  Medication Sig Dispense Refill  . albuterol (PROAIR HFA) 108 (90 Base) MCG/ACT inhaler INHALE 2 PUFFS INTO THE LUNGS BY MOUTH EVERY 6 HOURS AS NEEDED FOR WHEEZING/SHORTNESS OF BREATH 25.5 each 11  . albuterol (PROVENTIL) (2.5 MG/3ML) 0.083% nebulizer solution Take 3 mLs (2.5 mg total) by nebulization every 6 (six) hours as needed for wheezing or shortness of breath. 120 mL 12  . aspirin 81 MG tablet Take 81 mg by mouth daily.     . Azelastine-Fluticasone (DYMISTA) 137-50 MCG/ACT SUSP Place 2 sprays into both nostrils at bedtime. 1 Bottle 1  . bisacodyl (DULCOLAX) 5 MG EC tablet Take 5 mg by mouth daily as needed for moderate constipation. Use as directed for colonoscopy prep    . cetirizine (ZYRTEC) 10 MG tablet Take 1 tablet (10 mg total) by mouth daily. 100 tablet 3  . Cholecalciferol (VITAMIN D) 1000 UNITS capsule Take 1,000 Units by mouth every morning.     . Cyanocobalamin (VITAMIN B-12) 1000 MCG SUBL Place 1 tablet (1,000 mcg total) under the tongue daily. 100 tablet 3  . ferrous sulfate 325 (65 FE) MG tablet Take 325 mg by mouth 3 (three) times daily with meals.     . fluticasone furoate-vilanterol (BREO ELLIPTA) 100-25 MCG/INH AEPB Inhale 1 puff, then rinse mouth, once daily 60 each 12  . furosemide (LASIX) 40 MG tablet Take 1 tablet (40 mg total) by mouth daily as needed. 30 tablet 3  . HYDROcodone-acetaminophen (NORCO/VICODIN) 5-325 MG tablet Take 1 tablet by mouth 2 (two) times daily as needed for moderate pain. 20 tablet 0  . ipratropium (ATROVENT) 0.06 % nasal spray Place 1 spray into both nostrils 4 (four) times daily. 15 mL 5  . irbesartan-hydrochlorothiazide (AVALIDE) 150-12.5 MG tablet Take 1 tablet by mouth daily. 90 tablet 3  .  levocetirizine (XYZAL) 5 MG tablet Take 1 tablet (5 mg total) by mouth every evening. 30 tablet 5  . methocarbamol (ROBAXIN) 500 MG tablet Take 500 mg by mouth every 8 (eight) hours as needed for muscle spasms. Muscle spasms    . montelukast (SINGULAIR) 10 MG tablet TAKE ONE TABLET BY MOUTH ONCE DAILY AT BEDTIME 90 tablet 3  . omeprazole (PRILOSEC) 20 MG capsule Take 1 capsule (20 mg total) by mouth daily. 90 capsule 3  . polyethylene glycol powder (MIRALAX) powder Take 1 Container by mouth once. Use as directed for colonoscopy prep    . potassium chloride (K-DUR) 10 MEQ tablet TAKE ONE TABLET BY MOUTH ONCE DAILY 90 tablet 0  . ranitidine (ZANTAC) 150 MG tablet Take 1 tablet (150 mg total) by mouth at bedtime. 30 tablet 5   No facility-administered medications prior to visit.     ROS Review of Systems  Constitutional: Negative for activity change, appetite change, chills, fatigue and unexpected weight change.  HENT: Negative for congestion, mouth sores and sinus pressure.   Eyes: Negative for visual disturbance.  Respiratory: Negative for cough and chest tightness.   Gastrointestinal: Negative for abdominal pain and nausea.  Genitourinary: Negative for difficulty urinating, frequency and vaginal pain.  Musculoskeletal: Positive for back pain and gait problem.  Skin: Negative for pallor and rash.  Neurological: Negative for dizziness, tremors, weakness, numbness  and headaches.  Psychiatric/Behavioral: Negative for confusion and sleep disturbance.    Objective:  BP 128/84   Pulse 67   Temp 97.8 F (36.6 C) (Oral)   Ht 5\' 2"  (1.575 m)   Wt 229 lb (103.9 kg)   SpO2 98%   BMI 41.88 kg/m   BP Readings from Last 3 Encounters:  11/19/16 128/84  10/07/16 126/76  09/10/16 126/75    Wt Readings from Last 3 Encounters:  11/19/16 229 lb (103.9 kg)  11/12/16 227 lb 9.6 oz (103.2 kg)  10/07/16 228 lb 9.6 oz (103.7 kg)    Physical Exam  Constitutional: She appears well-developed. No  distress.  HENT:  Head: Normocephalic.  Right Ear: External ear normal.  Left Ear: External ear normal.  Nose: Nose normal.  Mouth/Throat: Oropharynx is clear and moist.  Eyes: Pupils are equal, round, and reactive to light. Conjunctivae are normal. Right eye exhibits no discharge. Left eye exhibits no discharge.  Neck: Normal range of motion. Neck supple. No JVD present. No tracheal deviation present. No thyromegaly present.  Cardiovascular: Normal rate, regular rhythm and normal heart sounds.   Pulmonary/Chest: No stridor. No respiratory distress. She has no wheezes.  Abdominal: Soft. Bowel sounds are normal. She exhibits no distension and no mass. There is no tenderness. There is no rebound and no guarding.  Musculoskeletal: She exhibits tenderness. She exhibits no edema.  Lymphadenopathy:    She has no cervical adenopathy.  Neurological: She displays normal reflexes. No cranial nerve deficit. She exhibits normal muscle tone. Coordination normal.  Skin: No rash noted. No erythema.  Psychiatric: She has a normal mood and affect. Her behavior is normal. Judgment and thought content normal.  LS tender w/ROM Obese  Lab Results  Component Value Date   WBC 6.3 03/12/2016   HGB 12.0 03/12/2016   HCT 37.2 03/12/2016   PLT 292 03/12/2016   GLUCOSE 106 (H) 03/08/2015   CHOL 144 07/14/2012   TRIG 59.0 07/14/2012   HDL 76.60 07/14/2012   LDLCALC 56 07/14/2012   ALT 18 02/03/2015   AST 20 02/03/2015   NA 143 03/08/2015   K 4.0 03/08/2015   CL 108 03/08/2015   CREATININE 1.03 (H) 03/14/2015   BUN 6 03/08/2015   CO2 29 03/08/2015   TSH 1.91 01/27/2013   INR 1.07 02/02/2013   HGBA1C 5.5 09/20/2013    Dg Chest 2 View  Result Date: 12/27/2015 CLINICAL DATA:  Cough and shortness of breath. EXAM: CHEST  2 VIEW COMPARISON:  02/03/2015 FINDINGS: Stable heart size and mediastinal contours, distorted by scoliosis and rotation. No cardiomegaly. There is no edema, consolidation, effusion, or  pneumothorax. Spondylosis, exaggerated kyphosis, and dextroscoliosis. No acute osseous finding. Cholecystectomy clips. IMPRESSION: Stable from prior.  No evidence of acute disease. Electronically Signed   By: Monte Fantasia M.D.   On: 12/27/2015 12:20    Assessment & Plan:   Diagnoses and all orders for this visit:  Flu vaccine need -     Flu vaccine HIGH DOSE PF   I am having Ms. Drouillard maintain her Vitamin D, aspirin, ferrous sulfate, cetirizine, Vitamin B-12, albuterol, albuterol, omeprazole, montelukast, methocarbamol, ranitidine, furosemide, HYDROcodone-acetaminophen, potassium chloride, irbesartan-hydrochlorothiazide, fluticasone furoate-vilanterol, Azelastine-Fluticasone, ipratropium, levocetirizine, polyethylene glycol powder, and bisacodyl.  No orders of the defined types were placed in this encounter.    Follow-up: No Follow-up on file.  Walker Kehr, MD

## 2016-11-19 NOTE — Assessment & Plan Note (Signed)
Diovan HCT, Furosemide prn, KCl

## 2016-11-19 NOTE — Assessment & Plan Note (Signed)
CEA pending.

## 2016-11-19 NOTE — Assessment & Plan Note (Signed)
Proair prn Singulair

## 2016-11-19 NOTE — Assessment & Plan Note (Signed)
On Vit B12 Labs

## 2016-11-19 NOTE — Patient Instructions (Signed)
MC well w/Jill 

## 2016-11-20 ENCOUNTER — Other Ambulatory Visit: Payer: Self-pay | Admitting: Internal Medicine

## 2016-11-20 ENCOUNTER — Telehealth: Payer: Self-pay

## 2016-11-20 MED ORDER — OMEPRAZOLE 20 MG PO CPDR: 20.0000 mg | DELAYED_RELEASE_CAPSULE | Freq: Every day | ORAL | 0 refills | Status: DC

## 2016-11-20 MED ORDER — POTASSIUM CHLORIDE ER 10 MEQ PO TBCR: 10.0000 meq | EXTENDED_RELEASE_TABLET | Freq: Every day | ORAL | 0 refills | Status: DC

## 2016-11-20 NOTE — Telephone Encounter (Signed)
-----   Message from Cassandria Anger, MD sent at 11/19/2016 11:31 PM EDT ----- [name], Please inform the patient that all labs are stable Thanks, AP

## 2016-11-20 NOTE — Telephone Encounter (Signed)
Pt has been notified and expressed understanding.

## 2016-11-20 NOTE — Telephone Encounter (Signed)
Done

## 2016-11-25 ENCOUNTER — Other Ambulatory Visit: Payer: Self-pay | Admitting: Internal Medicine

## 2016-11-25 MED ORDER — METHOCARBAMOL 500 MG PO TABS: 500.0000 mg | ORAL_TABLET | Freq: Three times a day (TID) | ORAL | 0 refills | Status: DC | PRN

## 2016-11-26 ENCOUNTER — Ambulatory Visit (AMBULATORY_SURGERY_CENTER): Payer: Medicare HMO | Admitting: Internal Medicine

## 2016-11-26 ENCOUNTER — Encounter: Payer: Self-pay | Admitting: Internal Medicine

## 2016-11-26 VITALS — BP 114/67 | HR 67 | Temp 97.5°F | Resp 14 | Ht 62.0 in | Wt 227.0 lb

## 2016-11-26 DIAGNOSIS — Z85038 Personal history of other malignant neoplasm of large intestine: Secondary | ICD-10-CM | POA: Diagnosis present

## 2016-11-26 MED ORDER — SODIUM CHLORIDE 0.9 % IV SOLN: 500.0000 mL | INTRAVENOUS | Status: DC

## 2016-11-26 NOTE — Progress Notes (Signed)
Spontaneous respirations throughout. VSS. Resting comfortably. To PACU on room air. Report to  RN. 

## 2016-11-26 NOTE — Op Note (Signed)
[name] Procedure Date: 11/26/2016 1:27 PM MRN: [id number] Endoscopist: Gatha Mayer , MD Age: 66 Referring MD:  Date of Birth: [birth year] Gender: Female Account #: [id number] Procedure:                Colonoscopy Indications:              High risk colon cancer surveillance: Personal                            history of colon cancer Medicines:                Propofol per Anesthesia, Monitored Anesthesia Care Procedure:                Pre-Anesthesia Assessment:                           - Prior to the procedure, a History and Physical                            was performed, and patient medications and                            allergies were reviewed. The patient's tolerance of                            previous anesthesia was also reviewed. The risks                            and benefits of the procedure and the sedation                            options and risks were discussed with the patient.                            All questions were answered, and informed consent                            was obtained. Prior Anticoagulants: The patient has                            taken no previous anticoagulant or antiplatelet                            agents. ASA Grade Assessment: III - A patient with                            severe systemic disease. After reviewing the risks                            and benefits, the patient was deemed in                            satisfactory condition to undergo the procedure.  After obtaining informed consent, the colonoscope                            was passed under direct vision. Throughout the                            procedure, the patient's blood pressure, pulse, and                            oxygen saturations were monitored continuously. The                            colonoscopy was technically difficult and complex                            due to  restricted mobility of the colon. Successful                            completion of the procedure was aided by                            withdrawing the scope and replacing with the                            pediatric colonoscope. The patient tolerated the                            procedure well. The bowel preparation used was                            Miralax. The appendiceal orifice and the rectum                            were photographed. The quality of the bowel                            preparation was adequate. The Model PCF-H190DL                            [id number]) scope was introduced through the anus                            and advanced to the the cecum, identified by                            appendiceal orifice and ileocecal valve. Scope In: 1:40:56 PM Scope Out: 2:02:15 PM Scope Withdrawal Time: 0 hours 11 minutes 55 seconds  Total Procedure Duration: 0 hours 21 minutes 19 seconds  Findings:                 The perianal and digital rectal examinations were                            normal.  There was evidence of a prior end-to-side                            colo-colonic anastomosis in the sigmoid colon. It                            was fixed in this area. Adult colonoscope changed                            to pediatric.This was patent and was characterized                            by healthy appearing mucosa. The anastomosis was                            traversed.                           The exam was otherwise without abnormality on                            direct and retroflexion views. Complications:            No immediate complications. Estimated Blood Loss:     Estimated blood loss: none. Impression:               - Patent end-to-side colo-colonic anastomosis,                            characterized by healthy appearing mucosa.                           - The examination was otherwise normal on direct                             and retroflexion views.                           - No specimens collected. Recommendation:           - Patient has a contact number available for                            emergencies. The signs and symptoms of potential                            delayed complications were discussed with the                            patient. Return to normal activities tomorrow.                            Written discharge instructions were provided to the                            patient.                           -  Continue present medications.                           - Repeat colonoscopy in 5 years for surveillance.                           - USE PEDIATRIC COLONOSCOPY TO PASS ANASTOMOSIS                           - Resume previous diet. Gatha Mayer, MD 11/26/2016 2:16:02 PM This report has been signed electronically.

## 2016-11-26 NOTE — Progress Notes (Signed)
Pt's states no medical or surgical changes since previsit or office visit. 

## 2016-11-26 NOTE — Patient Instructions (Addendum)
   No polyps, no signs of cancer.  Next routine colonoscopy or other screening test in 5 years - 2023  I appreciate the opportunity to care for you. Gatha Mayer, MD, FACG  YOU HAD AN ENDOSCOPIC PROCEDURE TODAY AT [facility]:   Refer to the procedure report that was given to you for any specific questions about what was found during the examination.  If the procedure report does not answer your questions, please call your gastroenterologist to clarify.  If you requested that your care partner not be given the details of your procedure findings, then the procedure report has been included in a sealed envelope for you to review at your convenience later.  YOU SHOULD EXPECT: Some feelings of bloating in the abdomen. Passage of more gas than usual.  Walking can help get rid of the air that was put into your GI tract during the procedure and reduce the bloating. If you had a lower endoscopy (such as a colonoscopy or flexible sigmoidoscopy) you may notice spotting of blood in your stool or on the toilet paper. If you underwent a bowel prep for your procedure, you may not have a normal bowel movement for a few days.  Please Note:  You might notice some irritation and congestion in your nose or some drainage.  This is from the oxygen used during your procedure.  There is no need for concern and it should clear up in a day or so.  SYMPTOMS TO REPORT IMMEDIATELY:   Following lower endoscopy (colonoscopy or flexible sigmoidoscopy):  Excessive amounts of blood in the stool  Significant tenderness or worsening of abdominal pains  Swelling of the abdomen that is new, acute  Fever of 100F or higher  For urgent or emergent issues, a gastroenterologist can be reached at any hour by calling [phone].   DIET:  We do recommend a small meal at first, but then you may proceed to your regular diet.  Drink plenty of fluids but you should avoid alcoholic beverages for 24  hours.  ACTIVITY:  You should plan to take it easy for the rest of today and you should NOT DRIVE or use heavy machinery until tomorrow (because of the sedation medicines used during the test).    FOLLOW UP: Our staff will call the number listed on your records the next business day following your procedure to check on you and address any questions or concerns that you may have regarding the information given to you following your procedure. If we do not reach you, we will leave a message.  However, if you are feeling well and you are not experiencing any problems, there is no need to return our call.  We will assume that you have returned to your regular daily activities without incident.  If any biopsies were taken you will be contacted by phone or by letter within the next 1-3 weeks.  Please call us at [phone] if you have not heard about the biopsies in 3 weeks.    SIGNATURES/CONFIDENTIALITY: You and/or your care partner have signed paperwork which will be entered into your electronic medical record.  These signatures attest to the fact that that the information above on your After Visit Summary has been reviewed and is understood.  Full responsibility of the confidentiality of this discharge information lies with you and/or your care-partner.

## 2016-11-27 ENCOUNTER — Telehealth: Payer: Self-pay | Admitting: *Deleted

## 2016-11-27 NOTE — Telephone Encounter (Signed)
No answer, left message to call if questions or concerns. 

## 2016-11-27 NOTE — Telephone Encounter (Signed)
  Follow up Call-  Call back number 11/26/2016  Post procedure Call Back phone  # [phone]  Permission to leave phone message Yes  Some recent data might be hidden     Patient questions:  Do you have a fever, pain , or abdominal swelling? No. Pain Score  0 *  Have you tolerated food without any problems? Yes.    Have you been able to return to your normal activities? Yes.    Do you have any questions about your discharge instructions: Diet   No. Medications  No. Follow up visit  No.  Do you have questions or concerns about your Care? No.  Actions: * If pain score is 4 or above: No action needed, pain <4.

## 2017-01-01 NOTE — Progress Notes (Signed)
Patient has follow up office visit with Dr. Ernst Bowler in [place] office on 01/08/17.

## 2017-01-08 ENCOUNTER — Ambulatory Visit: Payer: Medicare Other | Admitting: Allergy & Immunology

## 2017-01-13 ENCOUNTER — Ambulatory Visit: Payer: Medicare HMO | Admitting: Allergy & Immunology

## 2017-01-13 ENCOUNTER — Telehealth: Payer: Self-pay

## 2017-01-13 MED ORDER — PREDNISONE 20 MG PO TABS: ORAL_TABLET | ORAL | 0 refills | Status: DC

## 2017-01-13 NOTE — Telephone Encounter (Signed)
Inform patient that medication was sent to the pharmacy and she understood.

## 2017-01-13 NOTE — Telephone Encounter (Signed)
Reviewed note. This is still within the realm of a viral infection. Let's send in a prednisone course: 60mg  daily for three days, 40mg  daily for three days, 20mg  daily for three days, then stop. Please send in the script. Please ask her to call tomorrow with an update.  Salvatore Marvel, MD Allergy and [place] [facility] [place]

## 2017-01-13 NOTE — Telephone Encounter (Signed)
Patient called and stated that she has been coughing since Friday. She stated that she has taken Mucinex and used her saline nasal wash without any change. Please advise

## 2017-01-22 ENCOUNTER — Ambulatory Visit (INDEPENDENT_AMBULATORY_CARE_PROVIDER_SITE_OTHER): Payer: Medicare HMO | Admitting: Allergy & Immunology

## 2017-01-22 ENCOUNTER — Encounter: Payer: Self-pay | Admitting: Allergy & Immunology

## 2017-01-22 VITALS — BP 130/90 | HR 60 | Ht 61.0 in | Wt 228.0 lb

## 2017-01-22 DIAGNOSIS — J302 Other seasonal allergic rhinitis: Secondary | ICD-10-CM | POA: Diagnosis not present

## 2017-01-22 DIAGNOSIS — J4540 Moderate persistent asthma, uncomplicated: Secondary | ICD-10-CM | POA: Diagnosis not present

## 2017-01-22 DIAGNOSIS — J3089 Other allergic rhinitis: Secondary | ICD-10-CM | POA: Diagnosis not present

## 2017-01-22 DIAGNOSIS — J0100 Acute maxillary sinusitis, unspecified: Secondary | ICD-10-CM | POA: Diagnosis not present

## 2017-01-22 MED ORDER — LEVOFLOXACIN 750 MG PO TABS: 750.0000 mg | ORAL_TABLET | Freq: Every day | ORAL | 0 refills | Status: AC

## 2017-01-22 NOTE — Patient Instructions (Addendum)
1. Seasonal and perennial allergic rhinitis (trees, weeds, grasses, molds, dust mites, cat, dog and cockroach) - Consent signed for allergy shots.  - Make an appointment in two weeks for the first shot.  - Continue with Dymista (fluticasone/azelastine) two sprays per nostril 1-2 times daily as needed - Continue with ipratripium one spray per nostril every 6 hours as needed and Xyzal (levocetirizine) 5mg  tablet once daily - You can use an extra dose of the antihistamine, if needed, for breakthrough symptoms.  - Consider nasal saline rinses 1-2 times daily to remove allergens from the nasal cavities as well as help with mucous clearance (this is especially helpful to do before the nasal sprays are given)  2. Acute sinusitis - Your symptoms have lasted so long that I think we need to go ahead and started an antibiotic: Levaquin 750mg  once daily - Use Muxinex 600-1200mg  twice daily for help mucous clear up.  - Continue with nasal saline rinses.  3. Moderate persistent asthma, uncomplicated - Lung testing looked slightly lower today, but this is likely because of your sinus infection. - Daily controller medication(s): Breo 100/25 one puff once daily - Rescue medications: ProAir 4 puffs every 4-6 hours as needed - Asthma control goals:  * Full participation in all desired activities (may need albuterol before activity) * Albuterol use two time or less a week on average (not counting use with activity) * Cough interfering with sleep two time or less a month * Oral steroids no more than once a year * No hospitalizations  4. Return in about 3 months (around 04/22/2017).   Please inform us of any Emergency Department visits, hospitalizations, or changes in symptoms. Call us before going to the ED for breathing or allergy symptoms since we might be able to fit you in for a sick visit. Feel free to contact us anytime with any questions, problems, or concerns.  It was a pleasure to see you again  today! Enjoy the holiday season!  Websites that have reliable patient information: 1. American Academy of Asthma, Allergy, and Immunology: www.aaaai.org 2. Food Allergy Research and Education (FARE): foodallergy.org 3. Mothers of Asthmatics: http://www.asthmacommunitynetwork.org 4. American College of Allergy, Asthma, and Immunology: www.acaai.org
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
## 2017-01-23 DIAGNOSIS — J302 Other seasonal allergic rhinitis: Secondary | ICD-10-CM | POA: Diagnosis not present

## 2017-01-23 NOTE — Progress Notes (Signed)
VIALS EXP 01-23-18

## 2017-01-23 NOTE — Progress Notes (Signed)
Lm for pt to call us back  

## 2017-01-24 DIAGNOSIS — J3089 Other allergic rhinitis: Secondary | ICD-10-CM | POA: Diagnosis not present

## 2017-01-29 ENCOUNTER — Telehealth: Payer: Self-pay | Admitting: Allergy & Immunology

## 2017-01-29 MED ORDER — GUAIFENESIN-CODEINE 100-10 MG/5ML PO SYRP: 5.0000 mL | ORAL_SOLUTION | Freq: Three times a day (TID) | ORAL | 0 refills | Status: DC | PRN

## 2017-01-29 NOTE — Telephone Encounter (Signed)
Spoke with Pt, Pt states she is not feeling better. She says she has finished her Prednisone, however Pt states she still has cough, she is still taking antibiotics, message sent to Dr Ernst Bowler for advice.

## 2017-01-29 NOTE — Telephone Encounter (Signed)
Reviewed note. I will send in a codeine-containing cough syrup to use. There has been a very severe virus going around that leads to a cough that is quite persistent. Please call patient to let her know that the medication has been sent in.   Salvatore Marvel, MD [facility]

## 2017-01-29 NOTE — Telephone Encounter (Signed)
Patient was seen 01-22-17 for a bad cough. She said she has taken her prednisone and still has her bad cough, she is also on antibiotics. She wants to know if there is anything else that can be called in. [facility] in [place], [place].

## 2017-01-30 NOTE — Telephone Encounter (Signed)
VM left requesting Pt call back to advise that RX has been sent into pharmacy to help with her cough.

## 2017-01-30 NOTE — Telephone Encounter (Signed)
Patient called back and I informed her the prescription had been sent in.

## 2017-02-05 ENCOUNTER — Ambulatory Visit (INDEPENDENT_AMBULATORY_CARE_PROVIDER_SITE_OTHER): Payer: Medicare HMO | Admitting: *Deleted

## 2017-02-05 DIAGNOSIS — J309 Allergic rhinitis, unspecified: Secondary | ICD-10-CM | POA: Diagnosis not present

## 2017-02-05 MED ORDER — EPINEPHRINE 0.3 MG/0.3ML IJ SOAJ: INTRAMUSCULAR | 1 refills | Status: DC

## 2017-02-05 NOTE — Progress Notes (Signed)
Immunotherapy   Patient Details  Name: [name] MRN: [id number] Date of Birth: 1950/10/14  02/05/2017  [name] started injections for  G-[id number]/[id number]-DM Following schedule: B  Frequency:2 times per week Epi-Pen:Prescription for Epi-Pen given Consent signed and patient instructions given. Patient waited 30 min after injection without difficulties.    Orlene Erm 02/05/2017, 11:36 AM

## 2017-02-07 ENCOUNTER — Ambulatory Visit (INDEPENDENT_AMBULATORY_CARE_PROVIDER_SITE_OTHER): Payer: Medicare PPO

## 2017-02-07 DIAGNOSIS — J309 Allergic rhinitis, unspecified: Secondary | ICD-10-CM

## 2017-02-10 ENCOUNTER — Ambulatory Visit (INDEPENDENT_AMBULATORY_CARE_PROVIDER_SITE_OTHER): Payer: Medicare PPO

## 2017-02-10 DIAGNOSIS — J309 Allergic rhinitis, unspecified: Secondary | ICD-10-CM

## 2017-02-13 ENCOUNTER — Ambulatory Visit (INDEPENDENT_AMBULATORY_CARE_PROVIDER_SITE_OTHER): Payer: Medicare PPO

## 2017-02-13 DIAGNOSIS — J309 Allergic rhinitis, unspecified: Secondary | ICD-10-CM

## 2017-02-14 ENCOUNTER — Other Ambulatory Visit: Payer: Self-pay

## 2017-02-14 MED ORDER — FLUTICASONE FUROATE-VILANTEROL 200-25 MCG/INH IN AEPB: 1.0000 | INHALATION_SPRAY | Freq: Every day | RESPIRATORY_TRACT | 1 refills | Status: DC

## 2017-02-14 NOTE — Telephone Encounter (Signed)
Patient called, she is still not feeling better. Still has a very bad cough and very horse.   Please Advise

## 2017-02-14 NOTE — Telephone Encounter (Signed)
Called and spoke with patient informing her of Dr. Jeralyn Ruths recommendation. Breo 200 has been sent in.

## 2017-02-14 NOTE — Telephone Encounter (Signed)
Dr. Nelva Bush, can you please advise on this one.

## 2017-02-14 NOTE — Telephone Encounter (Signed)
Please send prescription for Breo 200  1 puff daily and see if more inhaled steroid helps decrease/stop cough.  She should rinse out mouth after use.     Please advise to not use her Breo 100 at this time.

## 2017-02-17 ENCOUNTER — Ambulatory Visit (INDEPENDENT_AMBULATORY_CARE_PROVIDER_SITE_OTHER): Payer: Medicare PPO | Admitting: Family Medicine

## 2017-02-17 ENCOUNTER — Ambulatory Visit: Payer: Medicare PPO

## 2017-02-17 ENCOUNTER — Encounter: Payer: Self-pay | Admitting: Family Medicine

## 2017-02-17 VITALS — BP 126/84 | HR 66 | Temp 98.3°F | Ht 61.0 in | Wt 228.0 lb

## 2017-02-17 DIAGNOSIS — J4541 Moderate persistent asthma with (acute) exacerbation: Secondary | ICD-10-CM | POA: Diagnosis not present

## 2017-02-17 DIAGNOSIS — J3089 Other allergic rhinitis: Secondary | ICD-10-CM

## 2017-02-17 DIAGNOSIS — J302 Other seasonal allergic rhinitis: Secondary | ICD-10-CM

## 2017-02-17 DIAGNOSIS — R059 Cough, unspecified: Secondary | ICD-10-CM

## 2017-02-17 DIAGNOSIS — R05 Cough: Secondary | ICD-10-CM | POA: Diagnosis not present

## 2017-02-17 MED ORDER — ALBUTEROL SULFATE (2.5 MG/3ML) 0.083% IN NEBU: 2.5000 mg | INHALATION_SOLUTION | RESPIRATORY_TRACT | 1 refills | Status: DC | PRN

## 2017-02-17 MED ORDER — BENZONATATE 100 MG PO CAPS: 100.0000 mg | ORAL_CAPSULE | Freq: Three times a day (TID) | ORAL | 0 refills | Status: DC | PRN

## 2017-02-17 NOTE — Patient Instructions (Addendum)
    1. Seasonal and perennial allergic rhinitis (trees, weeds, grasses, molds, dust mites, cat, dog and cockroach) - Continue allergy shots beginning next week.  - Continue with Dymista (fluticasone/azelastine) two sprays per nostril 1-2 times daily as needed - Continue with ipratripium one spray per nostril every 6 hours as needed and Xyzal (levocetirizine) 5mg  tablet once daily - You can use an extra dose of the antihistamine, if needed, for breakthrough symptoms.  - Consider nasal saline rinses 1-2 times daily to remove allergens from the nasal cavities as well as help with mucous clearance (this is especially helpful to do before the nasal sprays are given) - Begin Mucinex 600-1200mg  a day as needed for thick secretions.  - Continue saline nasal rinses - Tessalon Perles 100 mg. Take 1 every 8 hours as needed for cough  2. Moderate persistent asthma, uncomplicated - Lung testing looked slightly lower today and improved slightly after bronchodilator nebulizer treatment in the office - Prednisone 10 mg. Take 20 mg twice a day for 3 days, then take 20 mg for 1 day, then take 10 mg for 1 day, then stop.  - Daily controller medication(s): Breo 200/25 one puff once daily - Rescue medications: ProAir 2 puffs every 4 hours as needed for cough, wheeze, or shortness of breath - Asthma control goals:  * Full participation in all desired activities (may need albuterol before activity) * Albuterol use two time or less a week on average (not counting use with activity) * Cough interfering with sleep two time or less a month * Oral steroids no more than once a year * No hospitalizations  GERD - Cut out caffeine, chocolate, and peppermint intake  Follow up in 2 weeks.

## 2017-02-17 NOTE — Progress Notes (Signed)
[address] [place] [address] Dept: [phone]  FOLLOW UP NOTE  Patient ID: [name], female    DOB: August 12, 1950  Age: 67 y.o. MRN: [id number] Date of Office Visit: 02/17/2017  Assessment  Chief Complaint: Cough (Pt presents for ongoing unresolved cough for about 2 months.)  HPI [name] is a 67 year old female who presents to the clinic for a sick visit today.  She was last seen in this clinic on 01/22/2017 by Dr. Ernst Bowler for evaluation of asthma, maxillary sinusitis, and seasonal and perennial allergic rhinitis.  At that visit, she was treated with Levaquin 750 mg, Brio 100/25, Dymista, ipratropium nasal spray, and Xyzal 5 mg once a day.  She began immunotherapy on January 9 and has been receiving shots 2 times a week since then.  [name] is in the clinic today for allergy immunotherapy when she is noted to have an uncontrollable cough prior to her immunotherapy shot.  She has been sent back to the clinic to be seen as a sick visit.  At today's visit, she reports a cough she has had since before Thanksgiving and is worse today.  She reports the cough is worse at night and is alleviated some by propping her head up with several pillows at night.  She can feel thick drainage down the back of her throat.  She continues to take Zyrtec daily as well as Dymista.  Asthma is reported is not well controlled.  She denies shortness of breath, however, she reports wheezing which is worse in the morning.  She had been using Breo 100 until 4 days ago when her dose was increased to Breo 200-1 puff once a day.  She reports using her ProAirir 3-4 times a day over the last 2 weeks.  She denies fever and sick contacts.  Reflux is reported as well controlled.  She currently takes Prilosec 20 mg once a day as well as ranitidine 150 mg once a day.  Current medications are listed in the chart.   Drug Allergies:  No Known Allergies  Physical Exam: BP 126/84 (BP Location:  Left Arm, Patient Position: Sitting, Cuff Size: Normal)   Pulse 66   Temp 98.3 F (36.8 C) (Oral)   Ht 5\' 1"  (1.549 m)   Wt 228 lb (103.4 kg)   SpO2 96%   BMI 43.08 kg/m    Physical Exam  Constitutional: She is oriented to person, place, and time. She appears well-developed and well-nourished.  HENT:  Right Ear: External ear normal.  Left Ear: External ear normal.  Bilateral nares erythematous and edematous with clear nasal drainage noted.  Ears normal.  Eyes normal.  Pharynx slightly erythematous with no exudate noted.  Eyes: Conjunctivae are normal.  Neck: Normal range of motion.  Cardiovascular: Normal rate, regular rhythm and normal heart sounds.  S1-S2 normal.  No murmurs noted.  Regular heart rate and rhythm.  Pulmonary/Chest: Effort normal and breath sounds normal.  Lungs clear to auscultation  Musculoskeletal: Normal range of motion.  Neurological: She is alert and oriented to person, place, and time.  Skin: Skin is warm and dry.  Psychiatric: She has a normal mood and affect. Her behavior is normal.    Diagnostics: FV 1.52, FEV1 1.19.  Predicted FVC 2.13, predicted FEV1 1.65.  Spirometry indicates mild restriction.  Postbronchodilator therapy FVC 1.72, FEV1 1.32.  Spirometry indicates normal ventilatory function with an 11% increase in FEV1 postbronchodilator therapy.  Assessment and Plan: 1. Moderate persistent asthma with acute  exacerbation   2. Seasonal and perennial allergic rhinitis   3. Cough     Meds ordered this encounter  Medications  . albuterol (PROVENTIL) (2.5 MG/3ML) 0.083% nebulizer solution    Sig: Take 3 mLs (2.5 mg total) by nebulization every 4 (four) hours as needed for wheezing or shortness of breath.    Dispense:  75 mL    Refill:  1  . benzonatate (TESSALON PERLES) 100 MG capsule    Sig: Take 1 capsule (100 mg total) by mouth 3 (three) times daily as needed for cough.    Dispense:  20 capsule    Refill:  0    Patient Instructions   1.  Seasonal and perennial allergic rhinitis (trees, weeds, grasses, molds, dust mites, cat, dog and cockroach) - Continue allergy shots beginning next week.  - Continue with Dymista (fluticasone/azelastine) two sprays per nostril 1-2 times daily as needed - Continue with ipratripium one spray per nostril every 6 hours as needed and Xyzal (levocetirizine) 5mg  tablet once daily - You can use an extra dose of the antihistamine, if needed, for breakthrough symptoms.  - Consider nasal saline rinses 1-2 times daily to remove allergens from the nasal cavities as well as help with mucous clearance (this is especially helpful to do before the nasal sprays are given) - Begin Mucinex 600-1200mg  a day as needed for thick secretions.  - Continue saline nasal rinses - Tessalon Perles 100 mg. Take 1 every 8 hours as needed for cough  2. Moderate persistent asthma, uncomplicated - Lung testing looked slightly lower today and improved slightly after bronchodilator nebulizer treatment in the office - Prednisone 10 mg. Take 20 mg twice a day for 3 days, then take 20 mg for 1 day, then take 10 mg for 1 day, then stop.  - Daily controller medication(s): Breo 200/25 one puff once daily - Rescue medications: ProAir 2 puffs every 4 hours as needed for cough, wheeze, or shortness of breath - Asthma control goals:  * Full participation in all desired activities (may need albuterol before activity) * Albuterol use two time or less a week on average (not counting use with activity) * Cough interfering with sleep two time or less a month * Oral steroids no more than once a year * No hospitalizations  GERD - Cut out caffeine, chocolate, and peppermint intake  Follow up in 2 weeks.    Return in about 2 weeks (around 03/03/2017), or if symptoms worsen or fail to improve.    Thank you for the opportunity to care for this patient.  Please do not hesitate to contact me with questions.  Gareth Morgan, FNP [facility]   I have provided oversight concerning [facility]'s evaluation and treatment of this patient's health issues addressed during today's encounter.  I agree with the assessment and therapeutic plan as outlined in the note.   Signed,   R Edgar Frisk, MD

## 2017-02-20 ENCOUNTER — Other Ambulatory Visit: Payer: Self-pay | Admitting: Internal Medicine

## 2017-02-20 ENCOUNTER — Telehealth: Payer: Self-pay | Admitting: Internal Medicine

## 2017-02-20 MED ORDER — ALBUTEROL SULFATE HFA 108 (90 BASE) MCG/ACT IN AERS: 2.0000 | INHALATION_SPRAY | Freq: Four times a day (QID) | RESPIRATORY_TRACT | 6 refills | Status: DC | PRN

## 2017-02-20 NOTE — Telephone Encounter (Signed)
Pt requesting a refill on albuterol inhaler.  This has been sent to preferred pharmacy.  Nothing further needed.

## 2017-02-25 ENCOUNTER — Encounter: Payer: Self-pay | Admitting: Internal Medicine

## 2017-02-25 ENCOUNTER — Ambulatory Visit (INDEPENDENT_AMBULATORY_CARE_PROVIDER_SITE_OTHER): Payer: Medicare PPO | Admitting: Internal Medicine

## 2017-02-25 DIAGNOSIS — I10 Essential (primary) hypertension: Secondary | ICD-10-CM

## 2017-02-25 DIAGNOSIS — Z6841 Body Mass Index (BMI) 40.0 and over, adult: Secondary | ICD-10-CM

## 2017-02-25 DIAGNOSIS — M25561 Pain in right knee: Secondary | ICD-10-CM | POA: Diagnosis not present

## 2017-02-25 DIAGNOSIS — M25562 Pain in left knee: Secondary | ICD-10-CM

## 2017-02-25 DIAGNOSIS — J4540 Moderate persistent asthma, uncomplicated: Secondary | ICD-10-CM | POA: Diagnosis not present

## 2017-02-25 DIAGNOSIS — G8929 Other chronic pain: Secondary | ICD-10-CM | POA: Diagnosis not present

## 2017-02-25 DIAGNOSIS — M545 Low back pain: Secondary | ICD-10-CM

## 2017-02-25 MED ORDER — FLUTICASONE FUROATE-VILANTEROL 200-25 MCG/INH IN AEPB: 1.0000 | INHALATION_SPRAY | Freq: Every day | RESPIRATORY_TRACT | 3 refills | Status: DC

## 2017-02-25 MED ORDER — RANITIDINE HCL 150 MG PO TABS: 150.0000 mg | ORAL_TABLET | Freq: Every day | ORAL | 3 refills | Status: DC

## 2017-02-25 MED ORDER — OMEPRAZOLE 20 MG PO CPDR: 20.0000 mg | DELAYED_RELEASE_CAPSULE | Freq: Every day | ORAL | 3 refills | Status: DC

## 2017-02-25 MED ORDER — MONTELUKAST SODIUM 10 MG PO TABS: 10.0000 mg | ORAL_TABLET | Freq: Every day | ORAL | 3 refills | Status: DC

## 2017-02-25 MED ORDER — POTASSIUM CHLORIDE ER 10 MEQ PO TBCR: 10.0000 meq | EXTENDED_RELEASE_TABLET | Freq: Every day | ORAL | 3 refills | Status: DC

## 2017-02-25 NOTE — Assessment & Plan Note (Signed)
Wt Readings from Last 3 Encounters:  02/25/17 222 lb (100.7 kg)  02/17/17 228 lb (103.4 kg)  01/22/17 228 lb (103.4 kg)

## 2017-02-25 NOTE — Assessment & Plan Note (Signed)
Diovan HCT, Furosemide prn, KCl

## 2017-02-25 NOTE — Progress Notes (Signed)
Subjective:  Patient ID: [name], female    DOB: 1950-09-09  Age: 67 y.o. MRN: [id number]  CC: No chief complaint on file.   HPI [name] presents for asthma, GERD, colon ca, B12 def. Lost wt on diet  Outpatient Medications Prior to Visit  Medication Sig Dispense Refill  . albuterol (PROVENTIL HFA;VENTOLIN HFA) 108 (90 Base) MCG/ACT inhaler Inhale 2 puffs into the lungs every 6 (six) hours as needed for wheezing or shortness of breath. 1 Inhaler 6  . albuterol (PROVENTIL) (2.5 MG/3ML) 0.083% nebulizer solution Take 3 mLs (2.5 mg total) by nebulization every 4 (four) hours as needed for wheezing or shortness of breath. 75 mL 1  . aspirin 81 MG tablet Take 81 mg by mouth daily.     . Azelastine-Fluticasone (DYMISTA) 137-50 MCG/ACT SUSP Place 2 sprays into both nostrils at bedtime. 1 Bottle 1  . benzonatate (TESSALON PERLES) 100 MG capsule Take 1 capsule (100 mg total) by mouth 3 (three) times daily as needed for cough. 20 capsule 0  . cetirizine (ZYRTEC) 10 MG tablet Take 1 tablet (10 mg total) by mouth daily. 100 tablet 3  . Cholecalciferol (VITAMIN D) 1000 UNITS capsule Take 1,000 Units by mouth every morning.     . Cyanocobalamin (VITAMIN B-12) 1000 MCG SUBL Place 1 tablet (1,000 mcg total) under the tongue daily. 100 tablet 3  . EPINEPHrine 0.3 mg/0.3 mL IJ SOAJ injection Use as directed for severe allergic reaction 2 Device 1  . ferrous sulfate 325 (65 FE) MG tablet Take 325 mg by mouth 3 (three) times daily with meals.     . furosemide (LASIX) 40 MG tablet Take 1 tablet (40 mg total) by mouth daily as needed. 30 tablet 3  . ipratropium (ATROVENT) 0.06 % nasal spray Place 1 spray into both nostrils 4 (four) times daily. 15 mL 5  . irbesartan-hydrochlorothiazide (AVALIDE) 150-12.5 MG tablet Take 1 tablet by mouth daily. 90 tablet 3  . levocetirizine (XYZAL) 5 MG tablet Take 1 tablet (5 mg total) by mouth every evening. 30 tablet 5  . methocarbamol (ROBAXIN) 500 MG  tablet Take 1 tablet (500 mg total) by mouth every 8 (eight) hours as needed for muscle spasms. Muscle spasms 60 tablet 0  . fluticasone furoate-vilanterol (BREO ELLIPTA) 200-25 MCG/INH AEPB Inhale 1 puff into the lungs daily. Rinse out mouth after use. 28 each 1  . montelukast (SINGULAIR) 10 MG tablet TAKE ONE TABLET BY MOUTH ONCE DAILY AT BEDTIME 90 tablet 3  . omeprazole (PRILOSEC) 20 MG capsule Take 1 capsule (20 mg total) by mouth daily. 90 capsule 0  . potassium chloride (K-DUR) 10 MEQ tablet Take 1 tablet (10 mEq total) by mouth daily. 90 tablet 0  . ranitidine (ZANTAC) 150 MG tablet TAKE 1 TABLET BY MOUTH ONCE DAILY AT BEDTIME 90 tablet 3   No facility-administered medications prior to visit.     ROS Review of Systems  Constitutional: Negative for activity change, appetite change, chills, fatigue and unexpected weight change.  HENT: Negative for congestion, mouth sores and sinus pressure.   Eyes: Negative for visual disturbance.  Respiratory: Negative for cough and chest tightness.   Gastrointestinal: Negative for abdominal pain and nausea.  Genitourinary: Negative for difficulty urinating, frequency and vaginal pain.  Musculoskeletal: Negative for back pain and gait problem.  Skin: Negative for pallor and rash.  Neurological: Negative for dizziness, tremors, weakness, numbness and headaches.  Psychiatric/Behavioral: Negative for confusion, sleep disturbance and suicidal ideas.  Objective:  BP 122/82 (BP Location: Right Arm, Patient Position: Sitting, Cuff Size: Large)   Pulse 65   Temp 98 F (36.7 C) (Oral)   Ht 5\' 1"  (1.549 m)   Wt 222 lb (100.7 kg)   SpO2 98%   BMI 41.95 kg/m   BP Readings from Last 3 Encounters:  02/25/17 122/82  02/17/17 126/84  01/22/17 130/90    Wt Readings from Last 3 Encounters:  02/25/17 222 lb (100.7 kg)  02/17/17 228 lb (103.4 kg)  01/22/17 228 lb (103.4 kg)    Physical Exam  Constitutional: She appears well-developed. No  distress.  HENT:  Head: Normocephalic.  Right Ear: External ear normal.  Left Ear: External ear normal.  Nose: Nose normal.  Mouth/Throat: Oropharynx is clear and moist.  Eyes: Conjunctivae are normal. Pupils are equal, round, and reactive to light. Right eye exhibits no discharge. Left eye exhibits no discharge.  Neck: Normal range of motion. Neck supple. No JVD present. No tracheal deviation present. No thyromegaly present.  Cardiovascular: Normal rate, regular rhythm and normal heart sounds.  Pulmonary/Chest: No stridor. No respiratory distress. She has no wheezes.  Abdominal: Soft. Bowel sounds are normal. She exhibits no distension and no mass. There is no tenderness. There is no rebound and no guarding.  Musculoskeletal: She exhibits no edema or tenderness.  Lymphadenopathy:    She has no cervical adenopathy.  Neurological: She displays normal reflexes. No cranial nerve deficit. She exhibits normal muscle tone. Coordination normal.  Skin: No rash noted. No erythema.  Psychiatric: She has a normal mood and affect. Her behavior is normal. Judgment and thought content normal.  Obese  Lab Results  Component Value Date   WBC 6.0 11/19/2016   HGB 11.9 (L) 11/19/2016   HCT 37.4 11/19/2016   PLT 294.0 11/19/2016   GLUCOSE 102 (H) 11/19/2016   CHOL 144 07/14/2012   TRIG 59.0 07/14/2012   HDL 76.60 07/14/2012   LDLCALC 56 07/14/2012   ALT 14 11/19/2016   AST 13 11/19/2016   NA 142 11/19/2016   K 3.8 11/19/2016   CL 104 11/19/2016   CREATININE 0.87 11/19/2016   BUN 12 11/19/2016   CO2 30 11/19/2016   TSH 1.91 01/27/2013   INR 1.07 02/02/2013   HGBA1C 5.5 09/20/2013    Dg Lumbar Spine 2-3 Views  Result Date: 11/19/2016 CLINICAL DATA:  Chronic low back pain.  No known injury. EXAM: LUMBAR SPINE - 2-3 VIEW COMPARISON:  02/04/2015 CT. FINDINGS: Diffuse degenerative disc disease with disc space narrowing and spurring. Vacuum disc at L3-4. Slight anterolisthesis of L4 on L5  related to facet disease. No fracture. Sclerosis around the SI joints bilaterally, left greater than right compatible with sacroiliitis, stable since prior CT. IMPRESSION: Mild degenerative disc and facet disease as above. No acute findings. Bilateral sacroiliitis. Electronically Signed   By: Rolm Baptise M.D.   On: 11/19/2016 11:42    Assessment & Plan:   There are no diagnoses linked to this encounter. I have changed [name]'s montelukast and ranitidine. I am also having her maintain her Vitamin D, aspirin, ferrous sulfate, cetirizine, Vitamin B-12, furosemide, irbesartan-hydrochlorothiazide, Azelastine-Fluticasone, ipratropium, levocetirizine, methocarbamol, EPINEPHrine, albuterol, benzonatate, albuterol, fluticasone furoate-vilanterol, omeprazole, and potassium chloride.  Meds ordered this encounter  Medications  . fluticasone furoate-vilanterol (BREO ELLIPTA) 200-25 MCG/INH AEPB    Sig: Inhale 1 puff into the lungs daily. Rinse out mouth after use.    Dispense:  3 each    Refill:  3  .  montelukast (SINGULAIR) 10 MG tablet    Sig: Take 1 tablet (10 mg total) by mouth daily with breakfast.    Dispense:  90 tablet    Refill:  3  . omeprazole (PRILOSEC) 20 MG capsule    Sig: Take 1 capsule (20 mg total) by mouth daily.    Dispense:  90 capsule    Refill:  3  . potassium chloride (K-DUR) 10 MEQ tablet    Sig: Take 1 tablet (10 mEq total) by mouth daily.    Dispense:  90 tablet    Refill:  3  . ranitidine (ZANTAC) 150 MG tablet    Sig: Take 1 tablet (150 mg total) by mouth at bedtime.    Dispense:  90 tablet    Refill:  3     Follow-up: No Follow-up on file.  Walker Kehr, MD

## 2017-02-25 NOTE — Assessment & Plan Note (Signed)
Norco - rare use

## 2017-02-25 NOTE — Assessment & Plan Note (Signed)
Breo Singulair

## 2017-02-25 NOTE — Assessment & Plan Note (Signed)
OA Vit D

## 2017-03-03 ENCOUNTER — Ambulatory Visit (INDEPENDENT_AMBULATORY_CARE_PROVIDER_SITE_OTHER): Payer: Medicare PPO | Admitting: *Deleted

## 2017-03-03 DIAGNOSIS — J309 Allergic rhinitis, unspecified: Secondary | ICD-10-CM

## 2017-03-05 ENCOUNTER — Ambulatory Visit (INDEPENDENT_AMBULATORY_CARE_PROVIDER_SITE_OTHER): Payer: Medicare PPO

## 2017-03-05 DIAGNOSIS — J309 Allergic rhinitis, unspecified: Secondary | ICD-10-CM | POA: Diagnosis not present

## 2017-03-11 ENCOUNTER — Inpatient Hospital Stay: Payer: Medicare PPO

## 2017-03-11 ENCOUNTER — Telehealth: Payer: Self-pay | Admitting: Nurse Practitioner

## 2017-03-11 ENCOUNTER — Encounter: Payer: Self-pay | Admitting: Nurse Practitioner

## 2017-03-11 ENCOUNTER — Inpatient Hospital Stay: Payer: Medicare PPO | Attending: Nurse Practitioner | Admitting: Nurse Practitioner

## 2017-03-11 VITALS — BP 121/70 | HR 54 | Temp 97.6°F | Resp 18 | Ht 61.0 in | Wt 224.8 lb

## 2017-03-11 DIAGNOSIS — C184 Malignant neoplasm of transverse colon: Secondary | ICD-10-CM

## 2017-03-11 DIAGNOSIS — C185 Malignant neoplasm of splenic flexure: Secondary | ICD-10-CM | POA: Diagnosis not present

## 2017-03-11 DIAGNOSIS — Z85038 Personal history of other malignant neoplasm of large intestine: Secondary | ICD-10-CM

## 2017-03-11 DIAGNOSIS — Z862 Personal history of diseases of the blood and blood-forming organs and certain disorders involving the immune mechanism: Secondary | ICD-10-CM | POA: Diagnosis not present

## 2017-03-11 DIAGNOSIS — N943 Premenstrual tension syndrome: Secondary | ICD-10-CM | POA: Diagnosis not present

## 2017-03-11 LAB — CBC WITH DIFFERENTIAL/PLATELET
Basophils Absolute: 0.1 10*3/uL (ref 0.0–0.1)
Basophils Relative: 1 %
Eosinophils Absolute: 0.1 10*3/uL (ref 0.0–0.5)
Eosinophils Relative: 2 %
HEMATOCRIT: 35.5 % (ref 34.8–46.6)
Hemoglobin: 11.3 g/dL — ABNORMAL LOW (ref 11.6–15.9)
LYMPHS ABS: 1.1 10*3/uL (ref 0.9–3.3)
LYMPHS PCT: 20 %
MCH: 23.1 pg — AB (ref 25.1–34.0)
MCHC: 31.9 g/dL (ref 31.5–36.0)
MCV: 72.4 fL — AB (ref 79.5–101.0)
MONO ABS: 0.5 10*3/uL (ref 0.1–0.9)
Monocytes Relative: 10 %
NEUTROS ABS: 3.8 10*3/uL (ref 1.5–6.5)
Neutrophils Relative %: 67 %
Platelets: 242 10*3/uL (ref 145–400)
RBC: 4.91 MIL/uL (ref 3.70–5.45)
RDW: 17.3 % — ABNORMAL HIGH (ref 11.2–14.5)
WBC: 5.6 10*3/uL (ref 3.9–10.3)

## 2017-03-11 LAB — CEA (IN HOUSE-CHCC): CEA (CHCC-In House): 1.29 ng/mL (ref 0.00–5.00)

## 2017-03-11 NOTE — Telephone Encounter (Signed)
Gave patient avs and calendar with appts per 2/12 los.  °

## 2017-03-11 NOTE — Progress Notes (Signed)
  [place] OFFICE PROGRESS NOTE   Diagnosis:  Colon cancer  INTERVAL HISTORY:   [name] returns as scheduled.  He feels well.  No interim illnesses or infections.  No change in bowel habits.  No rectal bleeding.  No abdominal pain.  She has a good appetite.  Objective:  Vital signs in last 24 hours:  Blood pressure 121/70, pulse (!) 54, temperature 97.6 F (36.4 C), temperature source Oral, resp. rate 18, height '5\' 1"'$  (1.549 m), weight 224 lb 12.8 oz (102 kg), SpO2 99 %.    HEENT: Neck without mass. Lymphatics: No palpable cervical, supraclavicular, axillary or inguinal lymph nodes. Resp: Lungs clear bilaterally. Cardio: Regular rate and rhythm. GI: Abdomen soft and nontender.  No hepatomegaly.  No mass. Vascular: No leg edema.    Lab Results:  Lab Results  Component Value Date   WBC 5.6 03/11/2017   HGB 11.3 (L) 03/11/2017   HCT 35.5 03/11/2017   MCV 72.4 (L) 03/11/2017   PLT 242 03/11/2017   NEUTROABS 3.8 03/11/2017    Imaging:  No results found.  Medications: I have reviewed the patient's current medications.  Assessment/Plan: 1. 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   Presentation to the emergency room 02/02/2013 with a colonic obstruction secondary to tumor at the splenic flexure.   Baseline CEA on 02/02/2013 less than 0.5.   Initiation of adjuvant Xeloda 04/10/2013.   Cycle 2 adjuvant Xeloda 05/01/2013.   Cycle 3 adjuvant Xeloda 05/22/2013.   Cycle 4 adjuvant Xeloda 06/12/2013.   Cycle 5 adjuvant Xeloda 07/03/2013 (Xeloda dose reduced due to hand foot syndrome).   Cycle 6 adjuvant Xeloda 07/24/2013.   Cycle 7 adjuvant Xeloda 08/14/2013   Cycle 8  adjuvant Xeloda 09/07/2013.   Surveillance colonoscopy 11/26/2016-patent end to side colocolonic anastomosis, characterized by healthy-appearing mucosa.  Examination otherwise normal.  Repeat colonoscopy in 5 years for surveillance. 2. History of iron deficiency anemia. Recurrent anemia 02/01/2014, improved, Persistent red cell microcytosis 3. Asthma. 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 5. Status post ostomy reversal 11/25/2013. 6. CT 02/01/2014 with no evidence of local tumor recurrence or metastatic disease. New area of masslike thickening and small bowel dilatation at the mid small bowel  Status post deep enteroscopy at [facility] on 03/16/2014, negative.  Status post capsule endoscopy 03/22/2014, confirmed a small bowel tumor  Exploratory laparotomy with resection of a small bowel mass on 04/06/2014 with the pathology confirming invasive adenocarcinoma extending through small bowel wall and involving adjacent loops of adherent small bowel, 2 of 12 lymph nodes positive. History consistent with recurrent colon cancer.Loss of MLH1 and PMS 2, MSI high as was the January 2015 tumor     Disposition: [name] remains in clinical remission from colon cancer.  She will return for a follow-up visit and CEA in 6 months.  She will contact the office in the interim with any problems.    Ned Card ANP/GNP-BC   03/11/2017  12:37 PM

## 2017-03-12 ENCOUNTER — Ambulatory Visit (INDEPENDENT_AMBULATORY_CARE_PROVIDER_SITE_OTHER): Payer: Medicare PPO | Admitting: *Deleted

## 2017-03-12 ENCOUNTER — Other Ambulatory Visit: Payer: Self-pay | Admitting: Nurse Practitioner

## 2017-03-12 DIAGNOSIS — J309 Allergic rhinitis, unspecified: Secondary | ICD-10-CM | POA: Diagnosis not present

## 2017-03-12 DIAGNOSIS — C184 Malignant neoplasm of transverse colon: Secondary | ICD-10-CM

## 2017-03-14 ENCOUNTER — Ambulatory Visit (INDEPENDENT_AMBULATORY_CARE_PROVIDER_SITE_OTHER): Payer: Medicare PPO | Admitting: *Deleted

## 2017-03-14 DIAGNOSIS — J309 Allergic rhinitis, unspecified: Secondary | ICD-10-CM

## 2017-03-18 ENCOUNTER — Ambulatory Visit (INDEPENDENT_AMBULATORY_CARE_PROVIDER_SITE_OTHER): Payer: Medicare PPO | Admitting: *Deleted

## 2017-03-18 DIAGNOSIS — J309 Allergic rhinitis, unspecified: Secondary | ICD-10-CM | POA: Diagnosis not present

## 2017-03-20 ENCOUNTER — Telehealth: Payer: Self-pay | Admitting: Allergy & Immunology

## 2017-03-20 DIAGNOSIS — R05 Cough: Secondary | ICD-10-CM

## 2017-03-20 DIAGNOSIS — R059 Cough, unspecified: Secondary | ICD-10-CM

## 2017-03-20 MED ORDER — RANITIDINE HCL 300 MG PO TABS: 300.0000 mg | ORAL_TABLET | Freq: Every day | ORAL | 1 refills | Status: DC

## 2017-03-20 MED ORDER — OMEPRAZOLE 40 MG PO CPDR: 40.0000 mg | DELAYED_RELEASE_CAPSULE | Freq: Every day | ORAL | 1 refills | Status: DC

## 2017-03-20 NOTE — Telephone Encounter (Signed)
Patient called and said she still has a cough and would like something called in. [facility] [address] in [place], [place].

## 2017-03-20 NOTE — Telephone Encounter (Signed)
Spoke to patient advised of plan per [name]. Prescription sent in for omeprazole 40mg  and Ranitidine 300mg  90 day to [facility] per patient request. Patient states she will go get chest xray at [facility] on [address] on 03/21/2017. Order placed in system.

## 2017-03-20 NOTE — Telephone Encounter (Signed)
Anne please advise. 

## 2017-03-20 NOTE — Telephone Encounter (Signed)
Can you please have her increase the omeprazole to 40 mg once a day and increase the ranitidine to 300 mg once a day. Also have her get a chest x-ray since she has had symptoms for an extended period of time. We will be in contact with her after we see the result of the cxr or sooner if needed. Thank you so much

## 2017-03-21 ENCOUNTER — Ambulatory Visit
Admission: RE | Admit: 2017-03-21 | Discharge: 2017-03-21 | Disposition: A | Discharge status: Home / Self Care | Payer: Medicare PPO | Source: Ambulatory Visit | Attending: Allergy | Admitting: Allergy

## 2017-03-21 DIAGNOSIS — R05 Cough: Secondary | ICD-10-CM

## 2017-03-21 DIAGNOSIS — R059 Cough, unspecified: Secondary | ICD-10-CM

## 2017-03-21 NOTE — Addendum Note (Signed)
Addended by: Lucrezia Starch I on: 03/21/2017 10:47 AM   Modules accepted: Orders

## 2017-03-25 ENCOUNTER — Telehealth: Payer: Self-pay | Admitting: Allergy & Immunology

## 2017-03-25 MED ORDER — PREDNISONE 10 MG PO TABS: ORAL_TABLET | ORAL | 0 refills | Status: DC

## 2017-03-25 NOTE — Telephone Encounter (Addendum)
Pt called and said that she had not heard about her chest x-ray and needs something for her cough, it sound really bad. She said that the predisone help. [facility] [place]. [phone].   Pt called back at 4:30pm to see if you were going to call her.

## 2017-03-25 NOTE — Telephone Encounter (Signed)
Reviewed CXR ordered by Webb Silversmith. CXR was normal without evidence of acute pulmonary processes such as pneumonia. I would recommend making sure that she is using her rescue inhaler four puffs every 4-6 hours. Please make sure that she has picked up her Tessalon pearls.   I will also send in a prednisone script: Take 3 tabs (30mg ) twice daily for 3 days, then 2 tabs (20mg ) twice daily for 3 days, then 1 tab (10mg ) twice daily for 3 days, then STOP.  Salvatore Marvel, MD [facility]

## 2017-03-25 NOTE — Telephone Encounter (Signed)
Called and informed patient that we have sent in Prednisone. Patient stated she has not picked up the tessalon perles that was sent in but she stated that she would get that as well.

## 2017-03-28 MED ORDER — BENZONATATE 100 MG PO CAPS: 100.0000 mg | ORAL_CAPSULE | Freq: Three times a day (TID) | ORAL | 0 refills | Status: DC | PRN

## 2017-03-28 NOTE — Telephone Encounter (Signed)
Patient has a nasty cough and needs something for a cough.  She went to the pharmacy and there were not any tessalon perles.  Please Advise   [facility] [place].

## 2017-03-28 NOTE — Addendum Note (Signed)
Addended by: Horris Latino on: 03/28/2017 03:38 PM   Modules accepted: Orders

## 2017-03-28 NOTE — Telephone Encounter (Signed)
Informed patient sent in a new script for the Tessalon perles.

## 2017-04-08 ENCOUNTER — Ambulatory Visit (INDEPENDENT_AMBULATORY_CARE_PROVIDER_SITE_OTHER): Payer: Medicare PPO | Admitting: *Deleted

## 2017-04-08 DIAGNOSIS — J309 Allergic rhinitis, unspecified: Secondary | ICD-10-CM | POA: Diagnosis not present

## 2017-04-15 ENCOUNTER — Ambulatory Visit (INDEPENDENT_AMBULATORY_CARE_PROVIDER_SITE_OTHER): Payer: Medicare PPO | Admitting: *Deleted

## 2017-04-15 DIAGNOSIS — J309 Allergic rhinitis, unspecified: Secondary | ICD-10-CM

## 2017-04-24 ENCOUNTER — Ambulatory Visit: Payer: Medicare HMO | Admitting: Allergy & Immunology

## 2017-04-28 ENCOUNTER — Telehealth: Payer: Self-pay

## 2017-04-28 NOTE — Telephone Encounter (Signed)
Patient started having a cough again on Saturday and some clear drainage. Its keeping her up at night. She doesn't know what else to try. She has been able to get her allergy injections due to her husband and son having surgey.  Please [facility]

## 2017-04-28 NOTE — Telephone Encounter (Signed)
Please advise 

## 2017-04-29 ENCOUNTER — Ambulatory Visit (INDEPENDENT_AMBULATORY_CARE_PROVIDER_SITE_OTHER): Payer: Medicare PPO | Admitting: *Deleted

## 2017-04-29 ENCOUNTER — Telehealth: Payer: Self-pay | Admitting: Allergy & Immunology

## 2017-04-29 DIAGNOSIS — J309 Allergic rhinitis, unspecified: Secondary | ICD-10-CM | POA: Diagnosis not present

## 2017-04-29 MED ORDER — BENZONATATE 100 MG PO CAPS: 100.0000 mg | ORAL_CAPSULE | Freq: Three times a day (TID) | ORAL | 2 refills | Status: DC | PRN

## 2017-04-29 NOTE — Telephone Encounter (Signed)
Pt came in to see if you were going to call in something for her cough. She said it is bad at night when she try to lay down. [phone]. They sent a telephone contact yesterday

## 2017-04-29 NOTE — Telephone Encounter (Signed)
Spoke to patient advised prescription was sent and appointment was scheduled for 05/05/17 @ 11:15am

## 2017-04-29 NOTE — Telephone Encounter (Signed)
If it were an emergency, it probably should have been routed to an MD that was in the office.  Send in Rake pearls as last time. If this is not working, we many need to see her in the office. I might be able to fit her in next Tuesday in [place] or earlier if she comes to another office.   Salvatore Marvel, MD [facility]

## 2017-04-29 NOTE — Telephone Encounter (Signed)
See other telephone note.  

## 2017-04-29 NOTE — Addendum Note (Signed)
Addended by: Orlene Erm on: 04/29/2017 06:49 PM   Modules accepted: Orders

## 2017-05-05 ENCOUNTER — Ambulatory Visit (INDEPENDENT_AMBULATORY_CARE_PROVIDER_SITE_OTHER): Payer: Medicare PPO | Admitting: Allergy & Immunology

## 2017-05-05 ENCOUNTER — Encounter: Payer: Self-pay | Admitting: Allergy & Immunology

## 2017-05-05 VITALS — BP 128/72 | HR 80 | Temp 98.2°F | Resp 18

## 2017-05-05 DIAGNOSIS — J302 Other seasonal allergic rhinitis: Secondary | ICD-10-CM | POA: Diagnosis not present

## 2017-05-05 DIAGNOSIS — J4541 Moderate persistent asthma with (acute) exacerbation: Secondary | ICD-10-CM | POA: Diagnosis not present

## 2017-05-05 DIAGNOSIS — R059 Cough, unspecified: Secondary | ICD-10-CM

## 2017-05-05 DIAGNOSIS — R05 Cough: Secondary | ICD-10-CM | POA: Diagnosis not present

## 2017-05-05 DIAGNOSIS — J3089 Other allergic rhinitis: Secondary | ICD-10-CM | POA: Diagnosis not present

## 2017-05-05 MED ORDER — BUDESONIDE-FORMOTEROL FUMARATE 160-4.5 MCG/ACT IN AERO: 2.0000 | INHALATION_SPRAY | Freq: Two times a day (BID) | RESPIRATORY_TRACT | 5 refills | Status: DC

## 2017-05-05 NOTE — Patient Instructions (Addendum)
1. Seasonal and perennial allergic rhinitis (trees, weeds, grasses, molds, dust mites, cat, dog and cockroach) - Continue allergy shots beginning next week.  - Continue with Dymista (fluticasone/azelastine) two sprays per nostril 1-2 times daily as needed - Continue with ipratripium one spray per nostril every 6 hours as needed and Xyzal (levocetirizine) 5mg  tablet once daily - You can use an extra dose of the antihistamine, if needed, for breakthrough symptoms.  - Consider nasal saline rinses 1-2 times daily to remove allergens from the nasal cavities as well as help with mucous clearance (this is especially helpful to do before the nasal sprays are given) - Continue with Mucinex 600-1200mg  a day as needed for thick secretions.  - Continue saline nasal rinses  2. Moderate persistent asthma, uncomplicated - Lung testing looked stable today.  - You might be having a problem with the dry powder inhaler of the Breo, which be causing your coughing.  - Therefore, we will change you to Symbicort 160/4.5 two puffs twice daily.  - Spacer sample and demonstration provided. - Daily controller medication(s): Symbicort 160/4.36mcg two puffs twice daily with spacer - Prior to physical activity: ProAir 2 puffs 10-15 minutes before physical activity. - Rescue medications: ProAir 4 puffs every 4-6 hours as needed or albuterol nebulizer one vial every 4-6 hours as needed - Asthma control goals:  * Full participation in all desired activities (may need albuterol before activity) * Albuterol use two time or less a week on average (not counting use with activity) * Cough interfering with sleep two time or less a month * Oral steroids no more than once a year * No hospitalizations  3. GERD - Cut out caffeine, chocolate, and peppermint intake - Continue with omeprazole 40mg  in the morning. - Continue with ranitidine 300mg  at night.   4. Return in about 6 months (around 11/04/2017).   Please inform us of any  Emergency Department visits, hospitalizations, or changes in symptoms. Call us before going to the ED for breathing or allergy symptoms since we might be able to fit you in for a sick visit. Feel free to contact us anytime with any questions, problems, or concerns.   It was a pleasure to see you again today!  Websites that have reliable patient information: 1. American Academy of Asthma, Allergy, and Immunology: www.aaaai.org 2. Food Allergy Research and Education (FARE): foodallergy.org 3. Mothers of Asthmatics: http://www.asthmacommunitynetwork.org 4. American College of Allergy, Asthma, and Immunology: www.acaai.org
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
## 2017-05-07 ENCOUNTER — Telehealth: Payer: Self-pay

## 2017-05-07 MED ORDER — PREDNISONE 20 MG PO TABS: ORAL_TABLET | ORAL | 0 refills | Status: DC

## 2017-05-07 NOTE — Telephone Encounter (Signed)
Prednisone 20mg  BID called in to [facility]

## 2017-05-07 NOTE — Telephone Encounter (Signed)
Reviewed recent OV note from Dr. Ernst Bowler.      Please ensure she is still doing recommendation per Dr. Ernst Bowler:     - dymista two sprays per nostril 1-2 times daily as needed    - ipratripium one spray per nostril every 6 hours as needed     - Xyzal (levocetirizine) 5mg  tablet once daily    - nasal saline rinses 1-2 times daily     - Mucinex 600-1200mg  a day as needed for thick secretions.     - Symbicort 160/4.22mcg two puffs twice daily with spacer    - ProAir 4 puffs every 4-6 hours as needed or albuterol nebulizer one vial every 4-6 hours as needed     - omeprazole 40mg  in the morning.    - ranitidine 300mg  at night.       - she also can continue use of tessalon perles if she finds these to help subside the cough    - will have her do a prednisone burst of 20mg  bid x 5 days    - Dr. Ernst Bowler recommended referral to ENT thus will start this process as well

## 2017-05-07 NOTE — Telephone Encounter (Signed)
Pt was seen Monday, cough is not any better. Coughing all day and night. She is coughing up some yellow-brown mucus. Can you prescribe something to help with her cough

## 2017-05-08 ENCOUNTER — Ambulatory Visit (INDEPENDENT_AMBULATORY_CARE_PROVIDER_SITE_OTHER): Payer: Medicare PPO | Admitting: *Deleted

## 2017-05-08 ENCOUNTER — Ambulatory Visit: Payer: Medicare PPO | Admitting: Allergy & Immunology

## 2017-05-08 DIAGNOSIS — J309 Allergic rhinitis, unspecified: Secondary | ICD-10-CM | POA: Diagnosis not present

## 2017-05-08 MED ORDER — LEVOCETIRIZINE DIHYDROCHLORIDE 5 MG PO TABS: 5.0000 mg | ORAL_TABLET | Freq: Every evening | ORAL | 5 refills | Status: DC

## 2017-05-08 MED ORDER — AZELASTINE HCL 0.15 % NA SOLN: 2.0000 | Freq: Two times a day (BID) | NASAL | 5 refills | Status: DC

## 2017-05-08 MED ORDER — FLUTICASONE PROPIONATE 50 MCG/ACT NA SUSP: 2.0000 | Freq: Two times a day (BID) | NASAL | 3 refills | Status: DC

## 2017-05-08 MED ORDER — AZELASTINE-FLUTICASONE 137-50 MCG/ACT NA SUSP: 2.0000 | Freq: Every day | NASAL | 1 refills | Status: DC

## 2017-05-08 NOTE — Addendum Note (Signed)
Addended by: Lucrezia Starch I on: 05/08/2017 08:28 AM   Modules accepted: Orders

## 2017-05-08 NOTE — Telephone Encounter (Signed)
Prescription for prednisone sent. Patient is taking all meds as prescribed, except the Xyzal, which she needs refills on. I have sent in the refills as requested.

## 2017-05-12 ENCOUNTER — Ambulatory Visit (INDEPENDENT_AMBULATORY_CARE_PROVIDER_SITE_OTHER): Payer: Medicare PPO | Admitting: *Deleted

## 2017-05-12 DIAGNOSIS — J309 Allergic rhinitis, unspecified: Secondary | ICD-10-CM | POA: Diagnosis not present

## 2017-05-13 ENCOUNTER — Telehealth: Payer: Self-pay

## 2017-05-13 ENCOUNTER — Ambulatory Visit: Payer: Self-pay

## 2017-05-13 NOTE — Telephone Encounter (Signed)
Prior authorization requested on Dymista. This medication fax has been addressed previously. Please disregard,

## 2017-05-14 ENCOUNTER — Ambulatory Visit (INDEPENDENT_AMBULATORY_CARE_PROVIDER_SITE_OTHER): Payer: Medicare PPO | Admitting: *Deleted

## 2017-05-14 DIAGNOSIS — J309 Allergic rhinitis, unspecified: Secondary | ICD-10-CM | POA: Diagnosis not present

## 2017-05-26 ENCOUNTER — Ambulatory Visit (INDEPENDENT_AMBULATORY_CARE_PROVIDER_SITE_OTHER): Payer: Medicare PPO | Admitting: *Deleted

## 2017-05-26 DIAGNOSIS — J309 Allergic rhinitis, unspecified: Secondary | ICD-10-CM

## 2017-06-02 ENCOUNTER — Ambulatory Visit (INDEPENDENT_AMBULATORY_CARE_PROVIDER_SITE_OTHER): Payer: Medicare PPO | Admitting: *Deleted

## 2017-06-02 DIAGNOSIS — J309 Allergic rhinitis, unspecified: Secondary | ICD-10-CM

## 2017-06-04 ENCOUNTER — Ambulatory Visit (INDEPENDENT_AMBULATORY_CARE_PROVIDER_SITE_OTHER): Payer: Medicare PPO | Admitting: *Deleted

## 2017-06-04 DIAGNOSIS — J309 Allergic rhinitis, unspecified: Secondary | ICD-10-CM

## 2017-06-09 ENCOUNTER — Ambulatory Visit (INDEPENDENT_AMBULATORY_CARE_PROVIDER_SITE_OTHER): Payer: Medicare PPO | Admitting: *Deleted

## 2017-06-09 DIAGNOSIS — J309 Allergic rhinitis, unspecified: Secondary | ICD-10-CM | POA: Diagnosis not present

## 2017-06-11 ENCOUNTER — Ambulatory Visit (INDEPENDENT_AMBULATORY_CARE_PROVIDER_SITE_OTHER): Payer: Medicare PPO | Admitting: *Deleted

## 2017-06-11 DIAGNOSIS — J309 Allergic rhinitis, unspecified: Secondary | ICD-10-CM | POA: Diagnosis not present

## 2017-06-17 ENCOUNTER — Ambulatory Visit (INDEPENDENT_AMBULATORY_CARE_PROVIDER_SITE_OTHER): Payer: Medicare PPO | Admitting: *Deleted

## 2017-06-17 ENCOUNTER — Ambulatory Visit (INDEPENDENT_AMBULATORY_CARE_PROVIDER_SITE_OTHER): Payer: Medicare PPO | Admitting: Internal Medicine

## 2017-06-17 ENCOUNTER — Encounter: Payer: Self-pay | Admitting: Internal Medicine

## 2017-06-17 DIAGNOSIS — R600 Localized edema: Secondary | ICD-10-CM

## 2017-06-17 DIAGNOSIS — K219 Gastro-esophageal reflux disease without esophagitis: Secondary | ICD-10-CM | POA: Diagnosis not present

## 2017-06-17 DIAGNOSIS — C184 Malignant neoplasm of transverse colon: Secondary | ICD-10-CM

## 2017-06-17 DIAGNOSIS — I10 Essential (primary) hypertension: Secondary | ICD-10-CM

## 2017-06-17 DIAGNOSIS — J309 Allergic rhinitis, unspecified: Secondary | ICD-10-CM | POA: Diagnosis not present

## 2017-06-17 DIAGNOSIS — J4520 Mild intermittent asthma, uncomplicated: Secondary | ICD-10-CM | POA: Diagnosis not present

## 2017-06-17 DIAGNOSIS — R7309 Other abnormal glucose: Secondary | ICD-10-CM

## 2017-06-17 MED ORDER — BENZONATATE 100 MG PO CAPS: 100.0000 mg | ORAL_CAPSULE | Freq: Three times a day (TID) | ORAL | 1 refills | Status: DC | PRN

## 2017-06-17 NOTE — Assessment & Plan Note (Signed)
Tessalon prn 

## 2017-06-17 NOTE — Assessment & Plan Note (Signed)
Monitoring labs 

## 2017-06-17 NOTE — Assessment & Plan Note (Signed)
Labs w/Dr Benay Spice

## 2017-06-17 NOTE — Assessment & Plan Note (Signed)
Diovan HCT, Furosemide prn, KCl

## 2017-06-17 NOTE — Assessment & Plan Note (Signed)
On Omeprazole, Zantac

## 2017-06-17 NOTE — Progress Notes (Signed)
Subjective:  Patient ID: [name], female    DOB: 1950/10/31  Age: 67 y.o. MRN: [id number]  CC: No chief complaint on file.   HPI [name] presents for asthma, HTN, cancer f/u  Outpatient Medications Prior to Visit  Medication Sig Dispense Refill  . albuterol (PROVENTIL HFA;VENTOLIN HFA) 108 (90 Base) MCG/ACT inhaler Inhale 2 puffs into the lungs every 6 (six) hours as needed for wheezing or shortness of breath. 1 Inhaler 6  . albuterol (PROVENTIL) (2.5 MG/3ML) 0.083% nebulizer solution Take 3 mLs (2.5 mg total) by nebulization every 4 (four) hours as needed for wheezing or shortness of breath. 75 mL 1  . aspirin 81 MG tablet Take 81 mg by mouth daily.     . Azelastine HCl 0.15 % SOLN Place 2 sprays into both nostrils 2 (two) times daily. 30 mL 5  . Azelastine-Fluticasone (DYMISTA) 137-50 MCG/ACT SUSP Place 2 sprays into both nostrils at bedtime. 1 Bottle 1  . benzonatate (TESSALON PERLES) 100 MG capsule Take 1 capsule (100 mg total) by mouth 3 (three) times daily as needed for cough. 20 capsule 2  . budesonide-formoterol (SYMBICORT) 160-4.5 MCG/ACT inhaler Inhale 2 puffs into the lungs 2 (two) times daily. Rinse, gargle and spit out after use 1 Inhaler 5  . cetirizine (ZYRTEC) 10 MG tablet Take 1 tablet (10 mg total) by mouth daily. 100 tablet 3  . Cholecalciferol (VITAMIN D) 1000 UNITS capsule Take 1,000 Units by mouth every morning.     . Cyanocobalamin (VITAMIN B-12) 1000 MCG SUBL Place 1 tablet (1,000 mcg total) under the tongue daily. 100 tablet 3  . EPINEPHrine 0.3 mg/0.3 mL IJ SOAJ injection Use as directed for severe allergic reaction 2 Device 1  . ferrous sulfate 325 (65 FE) MG tablet Take 325 mg by mouth 3 (three) times daily with meals.     . fluticasone (FLONASE) 50 MCG/ACT nasal spray Place 2 sprays into both nostrils 2 (two) times daily. 16 g 3  . furosemide (LASIX) 40 MG tablet Take 1 tablet (40 mg total) by mouth daily as needed. 30 tablet 3  .  ipratropium (ATROVENT) 0.06 % nasal spray Place 1 spray into both nostrils 4 (four) times daily. 15 mL 5  . irbesartan-hydrochlorothiazide (AVALIDE) 150-12.5 MG tablet Take 1 tablet by mouth daily. 90 tablet 3  . levocetirizine (XYZAL) 5 MG tablet Take 1 tablet (5 mg total) by mouth every evening. 30 tablet 5  . methocarbamol (ROBAXIN) 500 MG tablet Take 1 tablet (500 mg total) by mouth every 8 (eight) hours as needed for muscle spasms. Muscle spasms 60 tablet 0  . montelukast (SINGULAIR) 10 MG tablet Take 1 tablet (10 mg total) by mouth daily with breakfast. 90 tablet 3  . omeprazole (PRILOSEC) 40 MG capsule Take 1 capsule (40 mg total) by mouth daily. 90 capsule 1  . potassium chloride (K-DUR) 10 MEQ tablet Take 1 tablet (10 mEq total) by mouth daily. 90 tablet 3  . ranitidine (ZANTAC) 300 MG tablet Take 1 tablet (300 mg total) by mouth at bedtime. 90 tablet 1  . fluticasone furoate-vilanterol (BREO ELLIPTA) 200-25 MCG/INH AEPB Inhale 1 puff into the lungs daily. Rinse out mouth after use. 3 each 3  . predniSONE (DELTASONE) 20 MG tablet Take one tablet twice for 5 days 10 tablet 0   No facility-administered medications prior to visit.     ROS Review of Systems  Constitutional: Negative for activity change, appetite change, chills, fatigue and unexpected weight  change.  HENT: Negative for congestion, mouth sores and sinus pressure.   Eyes: Negative for visual disturbance.  Respiratory: Negative for cough and chest tightness.   Gastrointestinal: Negative for abdominal pain and nausea.  Genitourinary: Negative for difficulty urinating, frequency and vaginal pain.  Musculoskeletal: Negative for back pain and gait problem.  Skin: Negative for pallor and rash.  Neurological: Negative for dizziness, tremors, weakness, numbness and headaches.  Psychiatric/Behavioral: Negative for confusion and sleep disturbance.    Objective:  BP 122/78 (BP Location: Left Arm, Patient Position: Sitting, Cuff  Size: Large)   Pulse 83   Temp 98.5 F (36.9 C) (Oral)   Ht 5\' 1"  (1.549 m)   Wt 227 lb (103 kg)   SpO2 98%   BMI 42.89 kg/m   BP Readings from Last 3 Encounters:  06/17/17 122/78  05/05/17 128/72  03/11/17 121/70    Wt Readings from Last 3 Encounters:  06/17/17 227 lb (103 kg)  03/11/17 224 lb 12.8 oz (102 kg)  02/25/17 222 lb (100.7 kg)    Physical Exam  Constitutional: She appears well-developed. No distress.  HENT:  Head: Normocephalic.  Right Ear: External ear normal.  Left Ear: External ear normal.  Nose: Nose normal.  Mouth/Throat: Oropharynx is clear and moist.  Eyes: Pupils are equal, round, and reactive to light. Conjunctivae are normal. Right eye exhibits no discharge. Left eye exhibits no discharge.  Neck: Normal range of motion. Neck supple. No JVD present. No tracheal deviation present. No thyromegaly present.  Cardiovascular: Normal rate, regular rhythm and normal heart sounds.  Pulmonary/Chest: No stridor. No respiratory distress. She has no wheezes.  Abdominal: Soft. Bowel sounds are normal. She exhibits no distension and no mass. There is no tenderness. There is no rebound and no guarding.  Musculoskeletal: She exhibits tenderness. She exhibits no edema.  Lymphadenopathy:    She has no cervical adenopathy.  Neurological: She displays normal reflexes. No cranial nerve deficit. She exhibits normal muscle tone. Coordination normal.  Skin: No rash noted. No erythema.  Psychiatric: She has a normal mood and affect. Her behavior is normal. Judgment and thought content normal.   obese  Lab Results  Component Value Date   WBC 5.6 03/11/2017   HGB 11.3 (L) 03/11/2017   HCT 35.5 03/11/2017   PLT 242 03/11/2017   GLUCOSE 102 (H) 11/19/2016   CHOL 144 07/14/2012   TRIG 59.0 07/14/2012   HDL 76.60 07/14/2012   LDLCALC 56 07/14/2012   ALT 14 11/19/2016   AST 13 11/19/2016   NA 142 11/19/2016   K 3.8 11/19/2016   CL 104 11/19/2016   CREATININE 0.87  11/19/2016   BUN 12 11/19/2016   CO2 30 11/19/2016   TSH 1.91 01/27/2013   INR 1.07 02/02/2013   HGBA1C 5.5 09/20/2013    Dg Chest 2 View  Result Date: 03/21/2017 CLINICAL DATA:  67 y/o F; 4 weeks shortness of breath, cough, reflux. EXAM: CHEST  2 VIEW COMPARISON:  12/27/2015 chest radiograph FINDINGS: Stable mild cardiomegaly. Clear lungs. No pleural effusion or pneumothorax. Right upper quadrant cholecystectomy clips. Mild S-shaped curvature of the spine and multilevel degenerative changes. IMPRESSION: Stable mild cardiomegaly.  No acute pulmonary process identified. Electronically Signed   By: Kristine Garbe M.D.   On: 03/21/2017 14:40    Assessment & Plan:   There are no diagnoses linked to this encounter. I have discontinued Zyla R. Tomkiewicz's fluticasone furoate-vilanterol and predniSONE. I am also having her maintain her Vitamin D, aspirin, ferrous  sulfate, cetirizine, Vitamin B-12, furosemide, irbesartan-hydrochlorothiazide, ipratropium, methocarbamol, EPINEPHrine, albuterol, albuterol, montelukast, potassium chloride, omeprazole, ranitidine, benzonatate, budesonide-formoterol, levocetirizine, Azelastine-Fluticasone, fluticasone, and Azelastine HCl.  No orders of the defined types were placed in this encounter.    Follow-up: No follow-ups on file.  Walker Kehr, MD

## 2017-06-17 NOTE — Assessment & Plan Note (Signed)
Controlled.  

## 2017-06-24 ENCOUNTER — Ambulatory Visit (INDEPENDENT_AMBULATORY_CARE_PROVIDER_SITE_OTHER): Payer: Medicare PPO | Admitting: *Deleted

## 2017-06-24 DIAGNOSIS — J309 Allergic rhinitis, unspecified: Secondary | ICD-10-CM

## 2017-06-26 ENCOUNTER — Ambulatory Visit (INDEPENDENT_AMBULATORY_CARE_PROVIDER_SITE_OTHER): Payer: Medicare PPO | Admitting: *Deleted

## 2017-06-26 DIAGNOSIS — J309 Allergic rhinitis, unspecified: Secondary | ICD-10-CM

## 2017-06-30 ENCOUNTER — Ambulatory Visit (INDEPENDENT_AMBULATORY_CARE_PROVIDER_SITE_OTHER): Payer: Medicare PPO

## 2017-06-30 DIAGNOSIS — J309 Allergic rhinitis, unspecified: Secondary | ICD-10-CM

## 2017-07-08 ENCOUNTER — Ambulatory Visit (INDEPENDENT_AMBULATORY_CARE_PROVIDER_SITE_OTHER): Payer: Medicare PPO | Admitting: *Deleted

## 2017-07-08 DIAGNOSIS — J309 Allergic rhinitis, unspecified: Secondary | ICD-10-CM

## 2017-07-15 ENCOUNTER — Ambulatory Visit (INDEPENDENT_AMBULATORY_CARE_PROVIDER_SITE_OTHER): Payer: Medicare PPO | Admitting: *Deleted

## 2017-07-15 DIAGNOSIS — J309 Allergic rhinitis, unspecified: Secondary | ICD-10-CM | POA: Diagnosis not present

## 2017-07-28 ENCOUNTER — Ambulatory Visit (INDEPENDENT_AMBULATORY_CARE_PROVIDER_SITE_OTHER): Payer: Medicare PPO | Admitting: *Deleted

## 2017-07-28 DIAGNOSIS — J309 Allergic rhinitis, unspecified: Secondary | ICD-10-CM

## 2017-08-04 ENCOUNTER — Other Ambulatory Visit: Payer: Self-pay | Admitting: Allergy & Immunology

## 2017-08-04 MED ORDER — PREDNISONE 10 MG PO TABS: 20.0000 mg | ORAL_TABLET | Freq: Every day | ORAL | 0 refills | Status: AC

## 2017-08-04 NOTE — Telephone Encounter (Signed)
Thanks heather.  Would send in prednisone 20mg  tablets 1 tablet daily x 5 days

## 2017-08-04 NOTE — Telephone Encounter (Signed)
Patient is requesting something for her dry cough. She said she has been coughing all night. [facility] in [place].

## 2017-08-04 NOTE — Telephone Encounter (Signed)
Per patient she is using her Dymista 2 sprays twice daily, her Xyzal twice daily and taking Mucinex with no relief from the cough. She is not about to sleep due to the cough. Informed patient Dr. Nelva Bush would send in a couple days of Prednisone but would still give message for Dr. Ernst Bowler to review tomorrow and see if he wants to add anything else. Patient agreed with plan.

## 2017-08-04 NOTE — Telephone Encounter (Signed)
I spoke with [name] and she states that she just has a very bad dry cough and has been taking [employer], which are not helping. She states that she usually gets prednisone and that helps. I did explain the we try not to just give prednisone unless absolutely necessary due to the side effects it can have (elevated blood sugar, masking new onset symptoms). Please advise and thank you.

## 2017-08-05 NOTE — Telephone Encounter (Signed)
Reviewed note. I would like to get her in to look at her spiro. This cough has been on and off over the past 6 months and it has been difficult to figure out the etiology of it. Please see if we can get her in today in [place] or on my schedule some other time this week.  Salvatore Marvel, MD [facility] [place]

## 2017-08-06 NOTE — Telephone Encounter (Signed)
I left a voicemail for the patient to give me a call back.

## 2017-08-07 MED ORDER — BENZONATATE 100 MG PO CAPS: 200.0000 mg | ORAL_CAPSULE | Freq: Three times a day (TID) | ORAL | 2 refills | Status: AC | PRN

## 2017-08-07 NOTE — Telephone Encounter (Signed)
Patient is calling back to see if she can get something to calm her cough down.

## 2017-08-07 NOTE — Telephone Encounter (Signed)
Left message to return call. Per [name] ask if interested in referral to pulmonologist due to chronic cough and advise of medication sent in.

## 2017-08-07 NOTE — Telephone Encounter (Signed)
Patient still has a Dry cough. She is not coughing up anything. Not sleeping as well due to it gets worse in the evening and night time.

## 2017-08-07 NOTE — Addendum Note (Signed)
Addended by: Valentina Shaggy on: 08/07/2017 06:32 PM   Modules accepted: Orders

## 2017-08-07 NOTE — Telephone Encounter (Signed)
I will send in Tessalon pearls again. Unfortunately, my finger print scanner thing is not working.   Salvatore Marvel, MD [facility] [place]

## 2017-08-08 NOTE — Telephone Encounter (Signed)
fyi

## 2017-08-08 NOTE — Telephone Encounter (Signed)
Patient states understanding and will pick up [place]. She currently has an appointment with Dr young on August 6. I let her know that she should defiantly keep that appointment with him. I told her to give [place] a call if the pearls are not working out after the weekend. Dr Ernst Bowler do you think you can send Dr  Annamaria Boots a message about the problems this patient has been having?   Thanks

## 2017-08-08 NOTE — Telephone Encounter (Signed)
Sure I will make a referral letter. Remind me to do that next week, Dee.   Salvatore Marvel, MD [facility] [place]

## 2017-08-08 NOTE — Telephone Encounter (Signed)
Called patient and received no answer.

## 2017-08-14 ENCOUNTER — Ambulatory Visit (INDEPENDENT_AMBULATORY_CARE_PROVIDER_SITE_OTHER): Payer: Medicare PPO | Admitting: *Deleted

## 2017-08-14 DIAGNOSIS — J309 Allergic rhinitis, unspecified: Secondary | ICD-10-CM | POA: Diagnosis not present

## 2017-08-20 ENCOUNTER — Ambulatory Visit (INDEPENDENT_AMBULATORY_CARE_PROVIDER_SITE_OTHER): Payer: Medicare PPO | Admitting: *Deleted

## 2017-08-20 DIAGNOSIS — J309 Allergic rhinitis, unspecified: Secondary | ICD-10-CM

## 2017-08-27 ENCOUNTER — Ambulatory Visit (INDEPENDENT_AMBULATORY_CARE_PROVIDER_SITE_OTHER): Payer: Medicare PPO | Admitting: *Deleted

## 2017-08-27 DIAGNOSIS — J309 Allergic rhinitis, unspecified: Secondary | ICD-10-CM

## 2017-09-02 ENCOUNTER — Ambulatory Visit (INDEPENDENT_AMBULATORY_CARE_PROVIDER_SITE_OTHER): Payer: Medicare PPO | Admitting: Internal Medicine

## 2017-09-02 ENCOUNTER — Encounter: Payer: Self-pay | Admitting: Internal Medicine

## 2017-09-02 ENCOUNTER — Ambulatory Visit (INDEPENDENT_AMBULATORY_CARE_PROVIDER_SITE_OTHER): Payer: Medicare PPO | Admitting: *Deleted

## 2017-09-02 DIAGNOSIS — G4733 Obstructive sleep apnea (adult) (pediatric): Secondary | ICD-10-CM | POA: Diagnosis not present

## 2017-09-02 DIAGNOSIS — J309 Allergic rhinitis, unspecified: Secondary | ICD-10-CM

## 2017-09-02 DIAGNOSIS — J3089 Other allergic rhinitis: Secondary | ICD-10-CM

## 2017-09-02 DIAGNOSIS — J4540 Moderate persistent asthma, uncomplicated: Secondary | ICD-10-CM | POA: Diagnosis not present

## 2017-09-02 DIAGNOSIS — J302 Other seasonal allergic rhinitis: Secondary | ICD-10-CM

## 2017-09-02 NOTE — Progress Notes (Signed)
Subjective:    Patient ID: [name], female    DOB: 22-Jan-1951, 67 y.o.   MRN: [id number]  HPI F never smoker followed for asthma, allergic rhinitis,, OSA, obesity/hypoventilation, complicated by GERD, arthritis, HBP, Colon Ca/ L hemicolectomy NPSG 12/12/05-AHI 38.4/hour, desaturation to 66%, body weight 250 pounds  ---------------------------------------------------------------------------  08/28/16- 67 year old female never smoker followed for asthma, allergic rhinitis, OSA, obesity/hypoventilation, complicated by GERD, arthritis, HBP, colon cancer/left hemicolectomy CPAP 9/Advanced ACUTE VISIT: Pt has noted cough that is not getting any better-worse at night and happens until she vomits. Pt has noted difference since being off Allergy vaccine- program ended December, 2017. Breo 100, neb albuterol, ProAir, Singulair, omeprazole/ zantac She complains left eye and nose run. Denies sinus pressure/pain. Dry cough is persistent with occasional wheeze. Cough cleared when she went to [place] over Fourth of July and resume when she returned. Son and mother live in her home and did not have cough problem. She avoids grass cutting because it causes sneeze. Inhalers do not help enough. She says she was better off while on allergy vaccine. Denies recognizing reflux or heartburn as she continues twice daily acid blocker. Cough is worse in the evening but not while lying down. Continues to use CPAP all night every night. No download available this visit. She believes she sleeps better with it.  09/02/2017- 67 year old female never smoker followed for asthma, allergic rhinitis, OSA, obesity/hypoventilation, complicated by GERD, arthritis, HBP, colon cancer/left hemicolectomy CPAP 9/Advanced Allergy vaccine- Dr Neldon Mc   Office Spirometry-stable mild restriction -----OSA and Asthma: DME: [facility] Pt continues to wear CPAP nighlty and DL attached. Pt feels that Tessalon Rx helps with cough and breathing.   Atrovent 0.06% nasal, Zyrtec, Singulair, Symbicort 160, albuterol HFA, Flonase/ Dymsita, , neb albuterol, Download 100% compliance AHI 0.5/hour.  She is comfortable with her CPAP and does not recognize any problems.  Thinks this machine is about 68 years old. Breathing has been comfortable but she likes to put a mask on when going outdoors especially in hot weather.  Has regular follow-up with her allergist.  Occasional use for rescue inhaler with no significant exacerbation. CXR 03/21/2017 IMPRESSION: Stable mild cardiomegaly.  No acute pulmonary process identified.  ROS-see HPI   + = positive Constitutional:   No-   weight loss, night sweats, fevers, chills, fatigue, lassitude. HEENT:   No-  headaches, difficulty swallowing, tooth/dental problems, sore throat,       No-  sneezing, itching, ear ache, nasal congestion, + post nasal drip,  CV:  No-   chest pain, orthopnea, PND, swelling in lower extremities, anasarca, dizziness, palpitations Resp: No- acute  shortness of breath with exertion or at rest.              No-   productive cough,  + non-productive cough,  No- coughing up of blood.              No-   change in color of mucus.  No-sustained wheezing.   Skin: No-   rash or lesions. GI:  + heartburn, indigestion, no-abdominal pain, nausea, vomiting,  GU: No  complaint MS:  No-   joint pain or swelling.   Neuro-     nothing unusual Psych:  No- change in mood or affect. No depression or anxiety.  No memory loss.  OBJ- Physical Exam   similar to previous exams General- Alert, Oriented, Affect-appropriate, Distress- none acute, +obese Skin- rash-none, lesions- none, excoriation- none Lymphadenopathy- none Head- atraumatic  Eyes- Gross vision intact, PERRLA, conjunctivae and secretions clear            Ears- Hearing, canals-normal            Nose- Clear, no-Septal dev, mucus, polyps, erosion, perforation             Throat- Mallampati III , mucosa clear,not dry , drainage-  none, tonsils- atrophic, + hoarse Neck- flexible , trachea midline, no stridor , thyroid nl, carotid no bruit Chest - symmetrical excursion , unlabored           Heart/CV- RRR , no murmur , no gallop  , no rub, nl s1 s2                           - JVD- none , edema- none, stasis changes- none, varices- none           Lung- clear to P&A, wheeze- none, cough-none , dullness-none, rub- none           Chest wall-  Abd-  Br/ Gen/ Rectal- Not done, not indicated Extrem- cyanosis- none, clubbing, none, atrophy- none, strength- nl Neuro- grossly intact to observation

## 2017-09-02 NOTE — Assessment & Plan Note (Signed)
She likes Dymista.  Feels better controlled on allergy vaccine.

## 2017-09-02 NOTE — Assessment & Plan Note (Signed)
She continues to benefit from CPAP with improved sleep quality and no snoring.  Download confirms excellent compliance and control.  We discussed replacement of machine and general comfort measures with no changes needed.

## 2017-09-02 NOTE — Assessment & Plan Note (Signed)
Intermittent uncomplicated pattern.  Her allergist has been refilling her meds and she has no concerns.

## 2017-09-02 NOTE — Patient Instructions (Signed)
We can continue CPAP 9, mask of choice, humidifier, supplies, AirView  Glad you are doing well. Please call if we can help

## 2017-09-09 ENCOUNTER — Telehealth: Payer: Self-pay

## 2017-09-09 ENCOUNTER — Inpatient Hospital Stay: Payer: Medicare PPO | Attending: Oncology | Admitting: Oncology

## 2017-09-09 ENCOUNTER — Ambulatory Visit (INDEPENDENT_AMBULATORY_CARE_PROVIDER_SITE_OTHER): Payer: Medicare PPO

## 2017-09-09 ENCOUNTER — Inpatient Hospital Stay: Payer: Medicare PPO

## 2017-09-09 VITALS — BP 118/80 | HR 79 | Temp 98.4°F | Resp 18 | Ht 62.0 in | Wt 225.9 lb

## 2017-09-09 DIAGNOSIS — J309 Allergic rhinitis, unspecified: Secondary | ICD-10-CM

## 2017-09-09 DIAGNOSIS — J45909 Unspecified asthma, uncomplicated: Secondary | ICD-10-CM | POA: Insufficient documentation

## 2017-09-09 DIAGNOSIS — C184 Malignant neoplasm of transverse colon: Secondary | ICD-10-CM

## 2017-09-09 DIAGNOSIS — C185 Malignant neoplasm of splenic flexure: Secondary | ICD-10-CM | POA: Diagnosis not present

## 2017-09-09 LAB — CBC WITH DIFFERENTIAL (CANCER CENTER ONLY)
BASOS PCT: 1 %
Basophils Absolute: 0 10*3/uL (ref 0.0–0.1)
EOS ABS: 0.1 10*3/uL (ref 0.0–0.5)
Eosinophils Relative: 1 %
HEMATOCRIT: 35.9 % (ref 34.8–46.6)
HEMOGLOBIN: 11.6 g/dL (ref 11.6–15.9)
Lymphocytes Relative: 28 %
Lymphs Abs: 1.5 10*3/uL (ref 0.9–3.3)
MCH: 23.8 pg — ABNORMAL LOW (ref 25.1–34.0)
MCHC: 32.3 g/dL (ref 31.5–36.0)
MCV: 73.8 fL — ABNORMAL LOW (ref 79.5–101.0)
Monocytes Absolute: 0.6 10*3/uL (ref 0.1–0.9)
Monocytes Relative: 11 %
NEUTROS ABS: 3.1 10*3/uL (ref 1.5–6.5)
NEUTROS PCT: 59 %
Platelet Count: 268 10*3/uL (ref 145–400)
RBC: 4.87 MIL/uL (ref 3.70–5.45)
RDW: 16.7 % — ABNORMAL HIGH (ref 11.2–14.5)
WBC Count: 5.3 10*3/uL (ref 3.9–10.3)

## 2017-09-09 LAB — IRON AND TIBC
Iron: 53 ug/dL (ref 41–142)
SATURATION RATIOS: 20 % — AB (ref 21–57)
TIBC: 270 ug/dL (ref 236–444)
UIBC: 217 ug/dL

## 2017-09-09 LAB — CEA (IN HOUSE-CHCC): CEA (CHCC-In House): 1.52 ng/mL (ref 0.00–5.00)

## 2017-09-09 LAB — FERRITIN: FERRITIN: 99 ng/mL (ref 11–307)

## 2017-09-09 NOTE — Telephone Encounter (Signed)
err

## 2017-09-09 NOTE — Progress Notes (Signed)
[name] OFFICE PROGRESS NOTE   Diagnosis: Colon cancer  INTERVAL HISTORY:   [name] returns as scheduled.  She feels well.  Good appetite.  No difficulty with bowel function.  No abdominal pain.  She takes multiple medications for asthma and is followed by pulmonary medicine.  Objective:  Vital signs in last 24 hours:  Blood pressure 118/80, pulse 79, temperature 98.4 F (36.9 C), temperature source Oral, resp. rate 18, height '5\' 2"'$  (1.575 m), weight 225 lb 14.4 oz (102.5 kg), SpO2 99 %.    HEENT: Neck without mass Lymphatics: No cervical, supraclavicular, axillary, or inguinal nodes Resp: Lungs clear bilaterally Cardio: Regular rate and rhythm GI: No mass, no hepatospleno megaly, nontender Vascular: No leg edema  Lab Results:  Lab Results  Component Value Date   WBC 5.3 09/09/2017   HGB 11.6 09/09/2017   HCT 35.9 09/09/2017   MCV 73.8 (L) 09/09/2017   PLT 268 09/09/2017   NEUTROABS 3.1 09/09/2017    CMP  Lab Results  Component Value Date   NA 142 11/19/2016   K 3.8 11/19/2016   CL 104 11/19/2016   CO2 30 11/19/2016   GLUCOSE 102 (H) 11/19/2016   BUN 12 11/19/2016   CREATININE 0.87 11/19/2016   CALCIUM 9.5 11/19/2016   PROT 7.1 11/19/2016   ALBUMIN 3.9 11/19/2016   AST 13 11/19/2016   ALT 14 11/19/2016   ALKPHOS 133 (H) 11/19/2016   BILITOT 0.5 11/19/2016   GFRNONAA 56 (L) 03/14/2015   GFRAA >60 03/14/2015    Lab Results  Component Value Date   CEA1 1.29 03/11/2017     Medications: I have reviewed the patient's current medications.   Assessment/Plan: 1. 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   Presentation to the emergency room 02/02/2013 with a colonic  obstruction secondary to tumor at the splenic flexure.   Baseline CEA on 02/02/2013 less than 0.5.   Initiation of adjuvant Xeloda 04/10/2013.   Cycle 2 adjuvant Xeloda 05/01/2013.   Cycle 3 adjuvant Xeloda 05/22/2013.   Cycle 4 adjuvant Xeloda 06/12/2013.   Cycle 5 adjuvant Xeloda 07/03/2013 (Xeloda dose reduced due to hand foot syndrome).   Cycle 6 adjuvant Xeloda 07/24/2013.   Cycle 7 adjuvant Xeloda 08/14/2013   Cycle 8 adjuvant Xeloda 09/07/2013.   Surveillance colonoscopy 11/26/2016-patent end to side colocolonic anastomosis, characterized by healthy-appearing mucosa.  Examination otherwise normal.  Repeat colonoscopy in 5 years for surveillance. 2. History of iron deficiency anemia. Recurrent anemia 02/01/2014, improved, Persistent red cell microcytosis 3. Asthma. 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 5. Status post ostomy reversal 11/25/2013. 6. CT 02/01/2014 with no evidence of local tumor recurrence or metastatic disease. New area of masslike thickening and small bowel dilatation at the mid small bowel  Status post deep enteroscopy at [facility] on 03/16/2014, negative.  Status post capsule endoscopy 03/22/2014, confirmed a small bowel tumor  Exploratory laparotomy with resection of a small bowel mass on 04/06/2014 with the pathology confirming invasive adenocarcinoma extending through small bowel wall and involving adjacent loops of adherent small bowel, 2 of 12 lymph nodes positive. History consistent with recurrent colon cancer.Loss of MLH1 and PMS 2, MSI high as was the January 2015 tumor  CT abdomen/pelvis 02/03/2015-no evidence of recurrent colon cancer, ventral hernias  Disposition: [name] remains in clinical remission from colon cancer.  We will follow-up on the CEA from today.  She will return for an office visit in 6 months.  She will undergo restaging CTs prior to the 54-monthoffice visit.  15 minutes were spent with the patient today.  The  majority of the time was used for counseling and coordination of care.  GBetsy Coder MD  09/09/2017  12:06 PM

## 2017-09-09 NOTE — Telephone Encounter (Signed)
Printed avs and calender of upcoming appointment. Per 8/13 los 

## 2017-09-10 ENCOUNTER — Encounter: Payer: Self-pay | Admitting: *Deleted

## 2017-09-10 NOTE — Progress Notes (Signed)
Vials made. Exp: 09-11-18. hv 

## 2017-09-12 DIAGNOSIS — J3089 Other allergic rhinitis: Secondary | ICD-10-CM | POA: Diagnosis not present

## 2017-09-15 DIAGNOSIS — J301 Allergic rhinitis due to pollen: Secondary | ICD-10-CM | POA: Diagnosis not present

## 2017-09-16 ENCOUNTER — Ambulatory Visit (INDEPENDENT_AMBULATORY_CARE_PROVIDER_SITE_OTHER): Payer: Medicare PPO | Admitting: *Deleted

## 2017-09-16 DIAGNOSIS — J309 Allergic rhinitis, unspecified: Secondary | ICD-10-CM

## 2017-09-30 ENCOUNTER — Ambulatory Visit (INDEPENDENT_AMBULATORY_CARE_PROVIDER_SITE_OTHER): Payer: Medicare PPO

## 2017-09-30 DIAGNOSIS — J309 Allergic rhinitis, unspecified: Secondary | ICD-10-CM

## 2017-10-17 ENCOUNTER — Ambulatory Visit (INDEPENDENT_AMBULATORY_CARE_PROVIDER_SITE_OTHER): Payer: Medicare PPO

## 2017-10-17 DIAGNOSIS — J309 Allergic rhinitis, unspecified: Secondary | ICD-10-CM | POA: Diagnosis not present

## 2017-10-21 ENCOUNTER — Ambulatory Visit (INDEPENDENT_AMBULATORY_CARE_PROVIDER_SITE_OTHER): Payer: Medicare PPO | Admitting: *Deleted

## 2017-10-21 DIAGNOSIS — J309 Allergic rhinitis, unspecified: Secondary | ICD-10-CM

## 2017-10-23 ENCOUNTER — Telehealth: Payer: Self-pay | Admitting: Internal Medicine

## 2017-10-23 MED ORDER — BENZONATATE 200 MG PO CAPS: 200.0000 mg | ORAL_CAPSULE | Freq: Three times a day (TID) | ORAL | 0 refills | Status: DC | PRN

## 2017-10-23 NOTE — Telephone Encounter (Signed)
Best is Delsym cough syrup otc. Can combine this with otc cough drops

## 2017-10-23 NOTE — Telephone Encounter (Signed)
Pt is aware of below recommendations and voiced her understanding.  Rx for Tessalon 200mg  has been sent to preferred pharmacy. Nothing further is needed.

## 2017-10-23 NOTE — Telephone Encounter (Signed)
Called and spoke with Patient.  She stated that she started coughing Wednesday.  She has a non productive cough, runny nose, no fever, or SHOB.  She stated that she has been using her nasal spray and her nebulizer as instructed, with no relief.  She was unable to sleep, due to coughing during the night.  She is requesting any prescriptions to be sent to [facility] in [place].    Dr. Annamaria Boots, please advise  No Known Allergies  Current Outpatient Medications on File Prior to Visit  Medication Sig Dispense Refill  . albuterol (PROVENTIL HFA;VENTOLIN HFA) 108 (90 Base) MCG/ACT inhaler Inhale 2 puffs into the lungs every 6 (six) hours as needed for wheezing or shortness of breath. 1 Inhaler 6  . albuterol (PROVENTIL) (2.5 MG/3ML) 0.083% nebulizer solution Take 3 mLs (2.5 mg total) by nebulization every 4 (four) hours as needed for wheezing or shortness of breath. 75 mL 1  . aspirin 81 MG tablet Take 81 mg by mouth daily.     . Azelastine HCl 0.15 % SOLN Place 2 sprays into both nostrils 2 (two) times daily. 30 mL 5  . Azelastine-Fluticasone (DYMISTA) 137-50 MCG/ACT SUSP Place 2 sprays into both nostrils at bedtime. 1 Bottle 1  . budesonide-formoterol (SYMBICORT) 160-4.5 MCG/ACT inhaler Inhale 2 puffs into the lungs 2 (two) times daily. Rinse, gargle and spit out after use 1 Inhaler 5  . cetirizine (ZYRTEC) 10 MG tablet Take 1 tablet (10 mg total) by mouth daily. 100 tablet 3  . Cholecalciferol (VITAMIN D) 1000 UNITS capsule Take 1,000 Units by mouth every morning.     . Cyanocobalamin (VITAMIN B-12) 1000 MCG SUBL Place 1 tablet (1,000 mcg total) under the tongue daily. 100 tablet 3  . EPINEPHrine 0.3 mg/0.3 mL IJ SOAJ injection Use as directed for severe allergic reaction 2 Device 1  . ferrous sulfate 325 (65 FE) MG tablet Take 325 mg by mouth 3 (three) times daily with meals.     . fluticasone (FLONASE) 50 MCG/ACT nasal spray Place 2 sprays into both nostrils 2 (two) times daily. 16 g 3  . furosemide  (LASIX) 40 MG tablet Take 1 tablet (40 mg total) by mouth daily as needed. 30 tablet 3  . ipratropium (ATROVENT) 0.06 % nasal spray Place 1 spray into both nostrils 4 (four) times daily. 15 mL 5  . irbesartan-hydrochlorothiazide (AVALIDE) 150-12.5 MG tablet Take 1 tablet by mouth daily. 90 tablet 3  . levocetirizine (XYZAL) 5 MG tablet Take 1 tablet (5 mg total) by mouth every evening. 30 tablet 5  . methocarbamol (ROBAXIN) 500 MG tablet Take 1 tablet (500 mg total) by mouth every 8 (eight) hours as needed for muscle spasms. Muscle spasms 60 tablet 0  . montelukast (SINGULAIR) 10 MG tablet Take 1 tablet (10 mg total) by mouth daily with breakfast. 90 tablet 3  . omeprazole (PRILOSEC) 40 MG capsule Take 1 capsule (40 mg total) by mouth daily. 90 capsule 1  . potassium chloride (K-DUR) 10 MEQ tablet Take 1 tablet (10 mEq total) by mouth daily. 90 tablet 3  . ranitidine (ZANTAC) 300 MG tablet Take 1 tablet (300 mg total) by mouth at bedtime. 90 tablet 1   No current facility-administered medications on file prior to visit.

## 2017-10-23 NOTE — Telephone Encounter (Signed)
Called and spoke to pt, who states that tessalon is not affordable.  Pt is requesting alternative.   CY please advise. Thanks

## 2017-10-23 NOTE — Telephone Encounter (Signed)
Pt is aware of below recommendations and voiced her understanding. Nothing further is needed.  

## 2017-10-23 NOTE — Telephone Encounter (Signed)
I suspect this may be a cold getting started.  Offer benzonatate perle 200 mg, # 30, 1 every 8 hours as needed for cough  Can also sip liquids, use cough drops

## 2017-10-28 ENCOUNTER — Ambulatory Visit (INDEPENDENT_AMBULATORY_CARE_PROVIDER_SITE_OTHER): Payer: Medicare PPO | Admitting: *Deleted

## 2017-10-28 DIAGNOSIS — J309 Allergic rhinitis, unspecified: Secondary | ICD-10-CM

## 2017-11-06 ENCOUNTER — Ambulatory Visit (INDEPENDENT_AMBULATORY_CARE_PROVIDER_SITE_OTHER): Payer: Medicare PPO | Admitting: *Deleted

## 2017-11-06 DIAGNOSIS — J309 Allergic rhinitis, unspecified: Secondary | ICD-10-CM | POA: Diagnosis not present

## 2017-11-10 ENCOUNTER — Ambulatory Visit (INDEPENDENT_AMBULATORY_CARE_PROVIDER_SITE_OTHER): Payer: Medicare PPO | Admitting: *Deleted

## 2017-11-10 DIAGNOSIS — J309 Allergic rhinitis, unspecified: Secondary | ICD-10-CM | POA: Diagnosis not present

## 2017-11-19 ENCOUNTER — Ambulatory Visit (INDEPENDENT_AMBULATORY_CARE_PROVIDER_SITE_OTHER): Payer: Medicare PPO

## 2017-11-19 DIAGNOSIS — J309 Allergic rhinitis, unspecified: Secondary | ICD-10-CM

## 2017-11-25 DIAGNOSIS — J301 Allergic rhinitis due to pollen: Secondary | ICD-10-CM | POA: Diagnosis not present

## 2017-11-25 NOTE — Progress Notes (Signed)
Vials exp 11-26-18

## 2017-11-26 DIAGNOSIS — J3089 Other allergic rhinitis: Secondary | ICD-10-CM | POA: Diagnosis not present

## 2017-12-01 ENCOUNTER — Ambulatory Visit (INDEPENDENT_AMBULATORY_CARE_PROVIDER_SITE_OTHER): Payer: Medicare PPO | Admitting: *Deleted

## 2017-12-01 DIAGNOSIS — J309 Allergic rhinitis, unspecified: Secondary | ICD-10-CM | POA: Diagnosis not present

## 2017-12-09 ENCOUNTER — Ambulatory Visit (INDEPENDENT_AMBULATORY_CARE_PROVIDER_SITE_OTHER): Payer: Medicare PPO | Admitting: *Deleted

## 2017-12-09 DIAGNOSIS — J309 Allergic rhinitis, unspecified: Secondary | ICD-10-CM

## 2017-12-22 ENCOUNTER — Ambulatory Visit (INDEPENDENT_AMBULATORY_CARE_PROVIDER_SITE_OTHER): Payer: Medicare PPO | Admitting: Internal Medicine

## 2017-12-22 ENCOUNTER — Ambulatory Visit (INDEPENDENT_AMBULATORY_CARE_PROVIDER_SITE_OTHER): Payer: Medicare PPO | Admitting: *Deleted

## 2017-12-22 ENCOUNTER — Encounter: Payer: Self-pay | Admitting: Internal Medicine

## 2017-12-22 VITALS — BP 120/82 | HR 81 | Temp 97.7°F | Ht 62.0 in | Wt 225.0 lb

## 2017-12-22 DIAGNOSIS — J4520 Mild intermittent asthma, uncomplicated: Secondary | ICD-10-CM

## 2017-12-22 DIAGNOSIS — J309 Allergic rhinitis, unspecified: Secondary | ICD-10-CM | POA: Diagnosis not present

## 2017-12-22 DIAGNOSIS — Z23 Encounter for immunization: Secondary | ICD-10-CM

## 2017-12-22 DIAGNOSIS — J302 Other seasonal allergic rhinitis: Secondary | ICD-10-CM

## 2017-12-22 DIAGNOSIS — I10 Essential (primary) hypertension: Secondary | ICD-10-CM

## 2017-12-22 DIAGNOSIS — G4700 Insomnia, unspecified: Secondary | ICD-10-CM | POA: Diagnosis not present

## 2017-12-22 DIAGNOSIS — C184 Malignant neoplasm of transverse colon: Secondary | ICD-10-CM | POA: Diagnosis not present

## 2017-12-22 DIAGNOSIS — D510 Vitamin B12 deficiency anemia due to intrinsic factor deficiency: Secondary | ICD-10-CM

## 2017-12-22 DIAGNOSIS — J3089 Other allergic rhinitis: Secondary | ICD-10-CM

## 2017-12-22 NOTE — Assessment & Plan Note (Signed)
Chronic  Proair prn, Singulair, Symbicort Allergy shots Flonase

## 2017-12-22 NOTE — Patient Instructions (Signed)

## 2017-12-22 NOTE — Assessment & Plan Note (Signed)
Allergy shots Flonase

## 2017-12-22 NOTE — Addendum Note (Signed)
Addended by: Karren Cobble on: 12/22/2017 11:56 AM   Modules accepted: Orders

## 2017-12-22 NOTE — Assessment & Plan Note (Signed)
Doing well 

## 2017-12-22 NOTE — Assessment & Plan Note (Signed)
Optional: cardiac CT scan for calcium scoring unless getting a chest CT w/Dr Benay Spice

## 2017-12-22 NOTE — Progress Notes (Signed)
Subjective:  Patient ID: [name], female    DOB: [birth year]  Age: 67 y.o. MRN: [id number]  CC: No chief complaint on file.   HPI [name] presents for asthma, allergies, HTN f/u  Outpatient Medications Prior to Visit  Medication Sig Dispense Refill  . albuterol (PROVENTIL HFA;VENTOLIN HFA) 108 (90 Base) MCG/ACT inhaler Inhale 2 puffs into the lungs every 6 (six) hours as needed for wheezing or shortness of breath. 1 Inhaler 6  . albuterol (PROVENTIL) (2.5 MG/3ML) 0.083% nebulizer solution Take 3 mLs (2.5 mg total) by nebulization every 4 (four) hours as needed for wheezing or shortness of breath. 75 mL 1  . aspirin 81 MG tablet Take 81 mg by mouth daily.     . Azelastine HCl 0.15 % SOLN Place 2 sprays into both nostrils 2 (two) times daily. 30 mL 5  . Azelastine-Fluticasone (DYMISTA) 137-50 MCG/ACT SUSP Place 2 sprays into both nostrils at bedtime. 1 Bottle 1  . budesonide-formoterol (SYMBICORT) 160-4.5 MCG/ACT inhaler Inhale 2 puffs into the lungs 2 (two) times daily. Rinse, gargle and spit out after use 1 Inhaler 5  . cetirizine (ZYRTEC) 10 MG tablet Take 1 tablet (10 mg total) by mouth daily. 100 tablet 3  . Cholecalciferol (VITAMIN D) 1000 UNITS capsule Take 1,000 Units by mouth every morning.     . Cyanocobalamin (VITAMIN B-12) 1000 MCG SUBL Place 1 tablet (1,000 mcg total) under the tongue daily. 100 tablet 3  . EPINEPHrine 0.3 mg/0.3 mL IJ SOAJ injection Use as directed for severe allergic reaction 2 Device 1  . ferrous sulfate 325 (65 FE) MG tablet Take 325 mg by mouth 3 (three) times daily with meals.     . fluticasone (FLONASE) 50 MCG/ACT nasal spray Place 2 sprays into both nostrils 2 (two) times daily. 16 g 3  . ipratropium (ATROVENT) 0.06 % nasal spray Place 1 spray into both nostrils 4 (four) times daily. 15 mL 5  . irbesartan-hydrochlorothiazide (AVALIDE) 150-12.5 MG tablet Take 1 tablet by mouth daily. 90 tablet 3  . levocetirizine (XYZAL) 5 MG tablet  Take 1 tablet (5 mg total) by mouth every evening. 30 tablet 5  . methocarbamol (ROBAXIN) 500 MG tablet Take 1 tablet (500 mg total) by mouth every 8 (eight) hours as needed for muscle spasms. Muscle spasms 60 tablet 0  . montelukast (SINGULAIR) 10 MG tablet Take 1 tablet (10 mg total) by mouth daily with breakfast. 90 tablet 3  . omeprazole (PRILOSEC) 40 MG capsule Take 1 capsule (40 mg total) by mouth daily. 90 capsule 1  . potassium chloride (K-DUR) 10 MEQ tablet Take 1 tablet (10 mEq total) by mouth daily. 90 tablet 3  . ranitidine (ZANTAC) 300 MG tablet Take 1 tablet (300 mg total) by mouth at bedtime. 90 tablet 1  . furosemide (LASIX) 40 MG tablet Take 1 tablet (40 mg total) by mouth daily as needed. 30 tablet 3   No facility-administered medications prior to visit.     ROS: Review of Systems  Constitutional: Negative for activity change, appetite change, chills, fatigue and unexpected weight change.  HENT: Negative for congestion, mouth sores and sinus pressure.   Eyes: Negative for visual disturbance.  Respiratory: Negative for cough and chest tightness.   Gastrointestinal: Negative for abdominal pain and nausea.  Genitourinary: Negative for difficulty urinating, frequency and vaginal pain.  Musculoskeletal: Positive for arthralgias. Negative for back pain and gait problem.  Skin: Negative for pallor and rash.  Neurological: Negative for  dizziness, tremors, weakness, numbness and headaches.  Psychiatric/Behavioral: Negative for confusion, sleep disturbance and suicidal ideas.    Objective:  BP 120/82 (BP Location: Left Arm, Patient Position: Sitting, Cuff Size: Large)   Pulse 81   Temp 97.7 F (36.5 C) (Oral)   Ht 5\' 2"  (1.575 m)   Wt 225 lb (102.1 kg)   SpO2 95%   BMI 41.15 kg/m   BP Readings from Last 3 Encounters:  12/22/17 120/82  09/09/17 118/80  09/02/17 124/78    Wt Readings from Last 3 Encounters:  12/22/17 225 lb (102.1 kg)  09/09/17 225 lb 14.4 oz (102.5  kg)  09/02/17 226 lb (102.5 kg)    Physical Exam  Constitutional: She appears well-developed. No distress.  HENT:  Head: Normocephalic.  Right Ear: External ear normal.  Left Ear: External ear normal.  Nose: Nose normal.  Mouth/Throat: Oropharynx is clear and moist.  Eyes: Pupils are equal, round, and reactive to light. Conjunctivae are normal. Right eye exhibits no discharge. Left eye exhibits no discharge.  Neck: Normal range of motion. Neck supple. No JVD present. No tracheal deviation present. No thyromegaly present.  Cardiovascular: Normal rate, regular rhythm and normal heart sounds.  Pulmonary/Chest: No stridor. No respiratory distress. She has no wheezes.  Abdominal: Soft. Bowel sounds are normal. She exhibits no distension and no mass. There is no tenderness. There is no rebound and no guarding.  Musculoskeletal: She exhibits no edema or tenderness.  Lymphadenopathy:    She has no cervical adenopathy.  Neurological: She displays normal reflexes. No cranial nerve deficit. She exhibits normal muscle tone. Coordination normal.  Skin: No rash noted. No erythema.  Psychiatric: She has a normal mood and affect. Her behavior is normal. Judgment and thought content normal.  Obese  Lab Results  Component Value Date   WBC 5.3 09/09/2017   HGB 11.6 09/09/2017   HCT 35.9 09/09/2017   PLT 268 09/09/2017   GLUCOSE 102 (H) 11/19/2016   CHOL 144 07/14/2012   TRIG 59.0 07/14/2012   HDL 76.60 07/14/2012   LDLCALC 56 07/14/2012   ALT 14 11/19/2016   AST 13 11/19/2016   NA 142 11/19/2016   K 3.8 11/19/2016   CL 104 11/19/2016   CREATININE 0.87 11/19/2016   BUN 12 11/19/2016   CO2 30 11/19/2016   TSH 1.91 01/27/2013   INR 1.07 02/02/2013   HGBA1C 5.5 09/20/2013    Dg Chest 2 View  Result Date: 03/21/2017 CLINICAL DATA:  67 y/o F; 4 weeks shortness of breath, cough, reflux. EXAM: CHEST  2 VIEW COMPARISON:  12/27/2015 chest radiograph FINDINGS: Stable mild cardiomegaly. Clear  lungs. No pleural effusion or pneumothorax. Right upper quadrant cholecystectomy clips. Mild S-shaped curvature of the spine and multilevel degenerative changes. IMPRESSION: Stable mild cardiomegaly.  No acute pulmonary process identified. Electronically Signed   By: Kristine Garbe M.D.   On: 03/21/2017 14:40    Assessment & Plan:   There are no diagnoses linked to this encounter.   No orders of the defined types were placed in this encounter.    Follow-up: No follow-ups on file.  Walker Kehr, MD

## 2017-12-22 NOTE — Assessment & Plan Note (Addendum)
Diovan HCT, Furosemide prn, KCl  Optional: cardiac CT scan for calcium scoring unless getting a chest CT w/Dr Benay Spice

## 2017-12-22 NOTE — Assessment & Plan Note (Signed)
On B12 

## 2018-01-01 ENCOUNTER — Telehealth: Payer: Self-pay | Admitting: Allergy & Immunology

## 2018-01-01 NOTE — Telephone Encounter (Signed)
Patient is having fever, chills. No body aches. I recommended patient go to Urgent care and be seen. I did let her know to call us with any further questions or if she is still not feeling better.

## 2018-01-01 NOTE — Telephone Encounter (Signed)
Can you call patient back? Any fevers or chills?  Make sure she is doing all the below:  - Continue with Dymista (fluticasone/azelastine) two sprays per nostril 1-2 times daily as needed - Continue with ipratripium one spray per nostril every 6 hours as needed and Xyzal (levocetirizine) 5mg  tablet once daily - You can use an extra dose of the antihistamine, if needed, for breakthrough symptoms.  - Consider nasal saline rinses 1-2 times daily to remove allergens from the nasal cavities as well as help with mucous clearance (this is especially helpful to do before the nasal sprays are given) - Continue with Mucinex 600-1200mg  a day as needed for thick secretions.  - Continue saline nasal rinses

## 2018-01-01 NOTE — Telephone Encounter (Signed)
Pt called and would like for you to call in something for her cough. She had the cough for 2 day and nose is draining and she has yellow mucus . [facility] [place]. [phone].

## 2018-01-01 NOTE — Telephone Encounter (Signed)
Dr. Maudie Mercury can you please advise and thank you.

## 2018-01-05 ENCOUNTER — Ambulatory Visit (INDEPENDENT_AMBULATORY_CARE_PROVIDER_SITE_OTHER): Payer: Medicare PPO

## 2018-01-05 DIAGNOSIS — J309 Allergic rhinitis, unspecified: Secondary | ICD-10-CM | POA: Diagnosis not present

## 2018-01-19 ENCOUNTER — Ambulatory Visit (INDEPENDENT_AMBULATORY_CARE_PROVIDER_SITE_OTHER): Payer: Medicare PPO | Admitting: *Deleted

## 2018-01-19 DIAGNOSIS — J309 Allergic rhinitis, unspecified: Secondary | ICD-10-CM | POA: Diagnosis not present

## 2018-01-23 ENCOUNTER — Other Ambulatory Visit: Payer: Self-pay | Admitting: Allergy & Immunology

## 2018-01-26 ENCOUNTER — Other Ambulatory Visit: Payer: Self-pay | Admitting: Allergy & Immunology

## 2018-01-30 ENCOUNTER — Other Ambulatory Visit: Payer: Self-pay | Admitting: *Deleted

## 2018-02-03 ENCOUNTER — Encounter: Payer: Self-pay | Admitting: Allergy & Immunology

## 2018-02-03 ENCOUNTER — Ambulatory Visit (INDEPENDENT_AMBULATORY_CARE_PROVIDER_SITE_OTHER): Payer: Medicare PPO | Admitting: Allergy & Immunology

## 2018-02-03 VITALS — BP 136/92 | HR 64 | Temp 98.1°F | Resp 20

## 2018-02-03 DIAGNOSIS — J4540 Moderate persistent asthma, uncomplicated: Secondary | ICD-10-CM | POA: Diagnosis not present

## 2018-02-03 DIAGNOSIS — R05 Cough: Secondary | ICD-10-CM | POA: Diagnosis not present

## 2018-02-03 DIAGNOSIS — J3089 Other allergic rhinitis: Secondary | ICD-10-CM | POA: Diagnosis not present

## 2018-02-03 DIAGNOSIS — J302 Other seasonal allergic rhinitis: Secondary | ICD-10-CM

## 2018-02-03 DIAGNOSIS — R059 Cough, unspecified: Secondary | ICD-10-CM

## 2018-02-03 MED ORDER — BENZONATATE 100 MG PO CAPS: 100.0000 mg | ORAL_CAPSULE | Freq: Three times a day (TID) | ORAL | 3 refills | Status: DC | PRN

## 2018-02-03 MED ORDER — METHYLPREDNISOLONE ACETATE 40 MG/ML IJ SUSP
40.0000 mg | Freq: Once | INTRAMUSCULAR | Status: AC
  Administered 2018-02-03: 40 mg via INTRAMUSCULAR
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
## 2018-02-03 NOTE — Patient Instructions (Addendum)
1. Seasonal and perennial allergic rhinitis (trees, weeds, grasses, molds, dust mites, cat, dog and cockroach) - Continue allergy shots.  - Continue with Dymista (fluticasone/azelastine) two sprays per nostril 1-2 times daily as needed - Continue with ipratripium one spray per nostril every 6 hours as needed and Xyzal (levocetirizine) 5mg  tablet once daily - You can use an extra dose of the antihistamine, if needed, for breakthrough symptoms.   2. Moderate persistent asthma, uncomplicated - Lung testing looked stable today.  - We gave you a DepoMedrol injection. - Start the prednisone packet provided today.  - Start Tessalon pearls as needed.  - Daily controller medication(s): Symbicort 160/4.35mcg two puffs twice daily with spacer - Prior to physical activity: ProAir 2 puffs 10-15 minutes before physical activity. - Rescue medications: ProAir 4 puffs every 4-6 hours as needed or albuterol nebulizer one vial every 4-6 hours as needed - Asthma control goals:  * Full participation in all desired activities (may need albuterol before activity) * Albuterol use two time or less a week on average (not counting use with activity) * Cough interfering with sleep two time or less a month * Oral steroids no more than once a year * No hospitalizations  3. GERD - Cut out caffeine, chocolate, and peppermint intake - Continue with omeprazole 40mg  in the morning. - Continue with ranitidine 300mg  at night.   4. Return in about 2 months (around 04/04/2018).    Please inform us of any Emergency Department visits, hospitalizations, or changes in symptoms. Call us before going to the ED for breathing or allergy symptoms since we might be able to fit you in for a sick visit. Feel free to contact us anytime with any questions, problems, or concerns.  It was a pleasure to see you again today!  Websites that have reliable patient information: 1. American Academy of Asthma, Allergy, and Immunology:  www.aaaai.org 2. Food Allergy Research and Education (FARE): foodallergy.org 3. Mothers of Asthmatics: http://www.asthmacommunitynetwork.org 4. American College of Allergy, Asthma, and Immunology: MonthlyElectricBill.co.uk   Make sure you are registered to vote! If you have moved or changed any of your contact information, you will need to get this updated before voting!    Voter ID laws are going into effect for the General Election in November 2020! Be prepared! Check out [email or web address] for more details.

## 2018-02-09 ENCOUNTER — Ambulatory Visit (INDEPENDENT_AMBULATORY_CARE_PROVIDER_SITE_OTHER): Payer: Medicare PPO

## 2018-02-09 DIAGNOSIS — J309 Allergic rhinitis, unspecified: Secondary | ICD-10-CM

## 2018-02-17 ENCOUNTER — Ambulatory Visit (INDEPENDENT_AMBULATORY_CARE_PROVIDER_SITE_OTHER): Payer: Medicare PPO | Admitting: *Deleted

## 2018-02-17 DIAGNOSIS — J309 Allergic rhinitis, unspecified: Secondary | ICD-10-CM

## 2018-02-24 ENCOUNTER — Ambulatory Visit (INDEPENDENT_AMBULATORY_CARE_PROVIDER_SITE_OTHER): Payer: Medicare PPO | Admitting: *Deleted

## 2018-02-24 DIAGNOSIS — J309 Allergic rhinitis, unspecified: Secondary | ICD-10-CM | POA: Diagnosis not present

## 2018-03-03 ENCOUNTER — Other Ambulatory Visit: Payer: Self-pay | Admitting: Allergy & Immunology

## 2018-03-04 ENCOUNTER — Ambulatory Visit (INDEPENDENT_AMBULATORY_CARE_PROVIDER_SITE_OTHER): Payer: Medicare PPO

## 2018-03-04 DIAGNOSIS — J309 Allergic rhinitis, unspecified: Secondary | ICD-10-CM | POA: Diagnosis not present

## 2018-03-09 ENCOUNTER — Inpatient Hospital Stay: Payer: Medicare PPO | Attending: Oncology

## 2018-03-09 ENCOUNTER — Ambulatory Visit (HOSPITAL_COMMUNITY)
Admission: RE | Admit: 2018-03-09 | Discharge: 2018-03-09 | Disposition: A | Discharge status: Home / Self Care | Payer: Medicare PPO | Source: Ambulatory Visit | Attending: Oncology | Admitting: Oncology

## 2018-03-09 ENCOUNTER — Encounter (HOSPITAL_COMMUNITY): Payer: Self-pay | Admitting: Radiology

## 2018-03-09 DIAGNOSIS — C185 Malignant neoplasm of splenic flexure: Secondary | ICD-10-CM | POA: Insufficient documentation

## 2018-03-09 DIAGNOSIS — R918 Other nonspecific abnormal finding of lung field: Secondary | ICD-10-CM | POA: Diagnosis not present

## 2018-03-09 DIAGNOSIS — G620 Drug-induced polyneuropathy: Secondary | ICD-10-CM | POA: Insufficient documentation

## 2018-03-09 DIAGNOSIS — Z9049 Acquired absence of other specified parts of digestive tract: Secondary | ICD-10-CM | POA: Insufficient documentation

## 2018-03-09 DIAGNOSIS — C189 Malignant neoplasm of colon, unspecified: Secondary | ICD-10-CM | POA: Diagnosis not present

## 2018-03-09 DIAGNOSIS — C184 Malignant neoplasm of transverse colon: Secondary | ICD-10-CM | POA: Diagnosis not present

## 2018-03-09 LAB — BASIC METABOLIC PANEL - CANCER CENTER ONLY
Anion gap: 10 (ref 5–15)
BUN: 14 mg/dL (ref 8–23)
CHLORIDE: 104 mmol/L (ref 98–111)
CO2: 28 mmol/L (ref 22–32)
Calcium: 9.5 mg/dL (ref 8.9–10.3)
Creatinine: 0.89 mg/dL (ref 0.44–1.00)
GFR, Est AFR Am: >60 mL/min (ref >60)
GFR, Estimated: >60 mL/min (ref >60)
Glucose, Bld: 100 mg/dL — ABNORMAL HIGH (ref 70–99)
Potassium: 3.5 mmol/L (ref 3.5–5.1)
Sodium: 142 mmol/L (ref 135–145)

## 2018-03-09 LAB — CEA (IN HOUSE-CHCC): CEA (CHCC-In House): 1.71 ng/mL (ref 0.00–5.00)

## 2018-03-09 MED ORDER — SODIUM CHLORIDE (PF) 0.9 % IJ SOLN
INTRAMUSCULAR | Status: AC
  Filled 2018-03-09: qty 50

## 2018-03-09 MED ORDER — IOHEXOL 300 MG/ML  SOLN
30.0000 mL | Freq: Once | INTRAMUSCULAR | Status: AC | PRN
  Administered 2018-03-09: 30 mL via ORAL

## 2018-03-09 MED ORDER — IOHEXOL 300 MG/ML  SOLN
100.0000 mL | Freq: Once | INTRAMUSCULAR | Status: AC | PRN
  Administered 2018-03-09: 100 mL via INTRAVENOUS

## 2018-03-11 ENCOUNTER — Telehealth: Payer: Self-pay | Admitting: Oncology

## 2018-03-11 ENCOUNTER — Inpatient Hospital Stay (HOSPITAL_BASED_OUTPATIENT_CLINIC_OR_DEPARTMENT_OTHER): Payer: Medicare PPO | Admitting: Nurse Practitioner

## 2018-03-11 ENCOUNTER — Ambulatory Visit (INDEPENDENT_AMBULATORY_CARE_PROVIDER_SITE_OTHER): Payer: Medicare PPO | Admitting: *Deleted

## 2018-03-11 ENCOUNTER — Encounter: Payer: Self-pay | Admitting: Nurse Practitioner

## 2018-03-11 VITALS — BP 114/80 | HR 67 | Temp 98.1°F | Resp 19 | Ht 62.0 in | Wt 226.4 lb

## 2018-03-11 DIAGNOSIS — G620 Drug-induced polyneuropathy: Secondary | ICD-10-CM | POA: Diagnosis not present

## 2018-03-11 DIAGNOSIS — C184 Malignant neoplasm of transverse colon: Secondary | ICD-10-CM

## 2018-03-11 DIAGNOSIS — R918 Other nonspecific abnormal finding of lung field: Secondary | ICD-10-CM | POA: Diagnosis not present

## 2018-03-11 DIAGNOSIS — J309 Allergic rhinitis, unspecified: Secondary | ICD-10-CM

## 2018-03-11 DIAGNOSIS — Z9049 Acquired absence of other specified parts of digestive tract: Secondary | ICD-10-CM | POA: Diagnosis not present

## 2018-03-11 DIAGNOSIS — C185 Malignant neoplasm of splenic flexure: Secondary | ICD-10-CM | POA: Diagnosis not present

## 2018-03-11 NOTE — Telephone Encounter (Signed)
Scheduled appt per 02/12 los. ° °Printed calendar and avs. °

## 2018-03-11 NOTE — Progress Notes (Addendum)
[facility] OFFICE PROGRESS NOTE   Diagnosis: Colon cancer  INTERVAL HISTORY:   [name] returns as scheduled.  She feels well.  No change in bowel habits.  She denies bleeding.  No abdominal pain.  She has a good appetite.  Objective:  Vital signs in last 24 hours:  Blood pressure 114/80, pulse 67, temperature 98.1 F (36.7 C), temperature source Oral, resp. rate 19, height '5\' 2"'$  (1.575 m), weight 226 lb 6.4 oz (102.7 kg), SpO2 98 %.    HEENT: Neck without mass. Lymphatics: No palpable cervical, supraclavicular, axillary or inguinal lymph nodes. Resp: Lungs clear bilaterally. Cardio: Regular rate and rhythm. GI: Abdomen soft and nontender.  No hepatomegaly.  No mass. Vascular: Trace bilateral lower leg edema.  Lab Results:  Lab Results  Component Value Date   WBC 5.3 09/09/2017   HGB 11.6 09/09/2017   HCT 35.9 09/09/2017   MCV 73.8 (L) 09/09/2017   PLT 268 09/09/2017   NEUTROABS 3.1 09/09/2017    Imaging:  Ct Chest W Contrast  Result Date: 03/09/2018 CLINICAL DATA:  Colon cancer, abdominal hernia repair. EXAM: CT CHEST, ABDOMEN, AND PELVIS WITH CONTRAST TECHNIQUE: Multidetector CT imaging of the chest, abdomen and pelvis was performed following the standard protocol during bolus administration of intravenous contrast. CONTRAST:  158m OMNIPAQUE IOHEXOL 300 MG/ML  SOLN COMPARISON:  CT abdomen pelvis 02/04/2015 and CT chest 02/01/2014. FINDINGS: CT CHEST FINDINGS Cardiovascular: Heart is at the upper limits of normal in size to mildly enlarged. No pericardial effusion. Mediastinum/Nodes: No pathologically enlarged mediastinal, hilar or axillary lymph nodes. Esophagus is grossly unremarkable. Lungs/Pleura: 2 mm nodule in the posterior right upper lobe (series 4, image 51), nonspecific. No pleural fluid. Airway is unremarkable. Musculoskeletal: Degenerative changes in the spine and shoulders. No worrisome lytic or sclerotic lesions. CT ABDOMEN PELVIS FINDINGS  Hepatobiliary: Liver is unremarkable. Cholecystectomy. No biliary ductal dilatation. Pancreas: Negative. Spleen: Negative. Adrenals/Urinary Tract: Adrenal glands are unremarkable. Low-attenuation lesion in the right kidney measures 1.4 cm and is difficult to further characterize due to size. Ureters are decompressed. Bladder is grossly unremarkable. Stomach/Bowel: Small hiatal hernia. Stomach is otherwise unremarkable. An anastomosis is seen in small bowel in the left abdomen with probable mild associated short segment atony and dilatation. A loop of unobstructed small bowel extends into a low right spigelian hernia. Left hemicolectomy. Colon is otherwise unremarkable. Vascular/Lymphatic: Circumaortic left renal vein. Vascular structures are otherwise unremarkable. Periportal lymph nodes measure up to 11 mm, as before. Reproductive: Hysterectomy.  No adnexal mass. Other: No free fluid.  Mesenteries and peritoneum are unremarkable. Musculoskeletal: Degenerative changes in the spine and sacroiliac joints. Grade 1 anterolisthesis of L4 on L5. IMPRESSION: 1. No evidence of recurrent or metastatic disease. 2. 2 mm posterior right upper lobe nodule, nonspecific. Continued attention on follow-up exams is suggested. 3. Right pelvic spigelian hernia contains unobstructed small bowel. 4. Electronically Signed   By: MLorin PicketM.D.   On: 03/09/2018 15:30   Ct Abdomen Pelvis W Contrast  Result Date: 03/09/2018 CLINICAL DATA:  Colon cancer, abdominal hernia repair. EXAM: CT CHEST, ABDOMEN, AND PELVIS WITH CONTRAST TECHNIQUE: Multidetector CT imaging of the chest, abdomen and pelvis was performed following the standard protocol during bolus administration of intravenous contrast. CONTRAST:  1093mOMNIPAQUE IOHEXOL 300 MG/ML  SOLN COMPARISON:  CT abdomen pelvis 02/04/2015 and CT chest 02/01/2014. FINDINGS: CT CHEST FINDINGS Cardiovascular: Heart is at the upper limits of normal in size to mildly enlarged. No pericardial  effusion. Mediastinum/Nodes: No  pathologically enlarged mediastinal, hilar or axillary lymph nodes. Esophagus is grossly unremarkable. Lungs/Pleura: 2 mm nodule in the posterior right upper lobe (series 4, image 51), nonspecific. No pleural fluid. Airway is unremarkable. Musculoskeletal: Degenerative changes in the spine and shoulders. No worrisome lytic or sclerotic lesions. CT ABDOMEN PELVIS FINDINGS Hepatobiliary: Liver is unremarkable. Cholecystectomy. No biliary ductal dilatation. Pancreas: Negative. Spleen: Negative. Adrenals/Urinary Tract: Adrenal glands are unremarkable. Low-attenuation lesion in the right kidney measures 1.4 cm and is difficult to further characterize due to size. Ureters are decompressed. Bladder is grossly unremarkable. Stomach/Bowel: Small hiatal hernia. Stomach is otherwise unremarkable. An anastomosis is seen in small bowel in the left abdomen with probable mild associated short segment atony and dilatation. A loop of unobstructed small bowel extends into a low right spigelian hernia. Left hemicolectomy. Colon is otherwise unremarkable. Vascular/Lymphatic: Circumaortic left renal vein. Vascular structures are otherwise unremarkable. Periportal lymph nodes measure up to 11 mm, as before. Reproductive: Hysterectomy.  No adnexal mass. Other: No free fluid.  Mesenteries and peritoneum are unremarkable. Musculoskeletal: Degenerative changes in the spine and sacroiliac joints. Grade 1 anterolisthesis of L4 on L5. IMPRESSION: 1. No evidence of recurrent or metastatic disease. 2. 2 mm posterior right upper lobe nodule, nonspecific. Continued attention on follow-up exams is suggested. 3. Right pelvic spigelian hernia contains unobstructed small bowel. 4. Electronically Signed   By: Lorin Picket M.D.   On: 03/09/2018 15:30    Medications: I have reviewed the patient's current medications.  Assessment/Plan: 1. 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   Presentation to the emergency room 02/02/2013 with a colonic obstruction secondary to tumor at the splenic flexure.   Baseline CEA on 02/02/2013 less than 0.5.   Initiation of adjuvant Xeloda 04/10/2013.   Cycle 2 adjuvant Xeloda 05/01/2013.   Cycle 3 adjuvant Xeloda 05/22/2013.   Cycle 4 adjuvant Xeloda 06/12/2013.   Cycle 5 adjuvant Xeloda 07/03/2013 (Xeloda dose reduced due to hand foot syndrome).   Cycle 6 adjuvant Xeloda 07/24/2013.   Cycle 7 adjuvant Xeloda 08/14/2013   Cycle 8 adjuvant Xeloda 09/07/2013.  Surveillance colonoscopy 11/26/2016-patentend toside colocolonic anastomosis, characterized by healthy-appearing mucosa. Examination otherwise normal. Repeat colonoscopy in 5 years for surveillance. 2. History of iron deficiency anemia. Recurrent anemia 02/01/2014, improved, Persistent red cell microcytosis 3. Asthma. 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 5. Status post ostomy reversal 11/25/2013. 6. CT 02/01/2014 with no evidence of local tumor recurrence or metastatic disease. New area of masslike thickening and small bowel dilatation at the mid small bowel  Status post deep enteroscopy at [facility] on 03/16/2014, negative.  Status post capsule endoscopy 03/22/2014, confirmed a small bowel tumor  Exploratory laparotomy with resection of a small bowel mass on 04/06/2014 with the pathology confirming invasive adenocarcinoma extending through small bowel wall and involving adjacent loops of adherent small bowel, 2 of 12 lymph nodes positive. History consistent with recurrent colon cancer.Loss of MLH1 and PMS 2, MSI high as was the January 2015 tumor  CT abdomen/pelvis 02/03/2015-no evidence of recurrent colon cancer, ventral  hernias  CT chest/abdomen/pelvis 03/09/2018-no evidence of recurrent or metastatic disease.  Nonspecific 2 mm posterior right upper lobe nodule.  Continued attention on follow-up exams suggested.   Disposition: [name] remains in clinical remission from colon cancer.  The recent restaging  CTs show no evidence of recurrent or metastatic disease.  A nonspecific 2 mm right upper lobe nodule was noted with continued attention on follow-up exams recommended.  She will return for a CEA and follow-up visit in 6 months.  She will contact the office in the interim with any problems.  Plan reviewed with [name].    Ned Card ANP/GNP-BC   03/11/2018  11:54 AM

## 2018-03-12 ENCOUNTER — Other Ambulatory Visit: Payer: Self-pay | Admitting: Internal Medicine

## 2018-03-16 ENCOUNTER — Ambulatory Visit (INDEPENDENT_AMBULATORY_CARE_PROVIDER_SITE_OTHER): Payer: Medicare PPO

## 2018-03-16 DIAGNOSIS — J309 Allergic rhinitis, unspecified: Secondary | ICD-10-CM

## 2018-03-18 DIAGNOSIS — J3089 Other allergic rhinitis: Secondary | ICD-10-CM | POA: Diagnosis not present

## 2018-03-18 NOTE — Progress Notes (Signed)
VIALS EXP 03-19-2019

## 2018-03-19 DIAGNOSIS — J301 Allergic rhinitis due to pollen: Secondary | ICD-10-CM | POA: Diagnosis not present

## 2018-03-20 ENCOUNTER — Other Ambulatory Visit: Payer: Self-pay | Admitting: Allergy & Immunology

## 2018-03-20 MED ORDER — BENZONATATE 100 MG PO CAPS: 100.0000 mg | ORAL_CAPSULE | Freq: Three times a day (TID) | ORAL | 3 refills | Status: DC | PRN

## 2018-03-20 NOTE — Telephone Encounter (Signed)
Please advise 

## 2018-03-20 NOTE — Telephone Encounter (Signed)
Left message letting patient know script sent into pharmacy.

## 2018-03-20 NOTE — Telephone Encounter (Signed)
Sent in Tessalon pearls.   Melissa Marvel, MD [facility]

## 2018-03-20 NOTE — Telephone Encounter (Signed)
Patient called requesting a prescription to be sent in for a dry cough. [facility] in [place].

## 2018-03-20 NOTE — Telephone Encounter (Signed)
Patient stated that she has some Tessalon and they don't seem to be helping. Can you send in anything different? Please advise.

## 2018-03-21 MED ORDER — GUAIFENESIN-CODEINE 100-10 MG/5ML PO SOLN: 10.0000 mL | Freq: Three times a day (TID) | ORAL | 0 refills | Status: DC | PRN

## 2018-03-21 NOTE — Addendum Note (Signed)
Addended by: Valentina Shaggy on: 03/21/2018 07:06 AM   Modules accepted: Orders

## 2018-03-21 NOTE — Telephone Encounter (Signed)
This will have to wait until Monday then. I just saw this and printed a prescription for a codeine-containing cough medicine. My finger print thing is not working, so it needs to be a paper script that she picks up. Script written and printed at [facility] office. I can come by on Monday to sign the script.  Salvatore Marvel, MD [facility]

## 2018-03-27 ENCOUNTER — Encounter: Payer: Self-pay | Admitting: Allergy & Immunology

## 2018-03-27 ENCOUNTER — Ambulatory Visit (INDEPENDENT_AMBULATORY_CARE_PROVIDER_SITE_OTHER): Payer: Medicare PPO | Admitting: Allergy & Immunology

## 2018-03-27 VITALS — BP 124/78 | HR 80 | Temp 97.8°F | Resp 18

## 2018-03-27 DIAGNOSIS — J302 Other seasonal allergic rhinitis: Secondary | ICD-10-CM | POA: Diagnosis not present

## 2018-03-27 DIAGNOSIS — J3089 Other allergic rhinitis: Secondary | ICD-10-CM | POA: Diagnosis not present

## 2018-03-27 DIAGNOSIS — J4540 Moderate persistent asthma, uncomplicated: Secondary | ICD-10-CM

## 2018-03-27 DIAGNOSIS — R053 Chronic cough: Secondary | ICD-10-CM

## 2018-03-27 DIAGNOSIS — R05 Cough: Secondary | ICD-10-CM

## 2018-03-27 MED ORDER — BUDESONIDE-FORMOTEROL FUMARATE 160-4.5 MCG/ACT IN AERO: 2.0000 | INHALATION_SPRAY | Freq: Two times a day (BID) | RESPIRATORY_TRACT | 5 refills | Status: DC

## 2018-03-27 MED ORDER — GUAIFENESIN-CODEINE 100-10 MG/5ML PO SOLN: 10.0000 mL | Freq: Three times a day (TID) | ORAL | 0 refills | Status: DC | PRN

## 2018-03-27 NOTE — Patient Instructions (Addendum)
1. Seasonal and perennial allergic rhinitis (trees, weeds, grasses, molds, dust mites, cat, dog and cockroach) - Continue allergy shots at the same schedule.  - Continue with Dymista (fluticasone/azelastine) two sprays per nostril 1-2 times daily as needed - Continue with ipratripium one spray per nostril every 6 hours as needed and Xyzal (levocetirizine) 5mg  tablet once daily - You can use an extra dose of the antihistamine, if needed, for breakthrough symptoms.   2. Moderate persistent asthma, uncomplicated - We did not do spirometric testing today since you were coughing so much today. - We are going to avoid more steroids at this point. - We will start a opioid cough medicine today to give you some sleep, but we are also going to refer you to see ENT for evaluation of this cough.  - You should get a call next week about scheduling it.  - Daily controller medication(s): Symbicort 160/4.35mcg two puffs twice daily with spacer - Prior to physical activity: ProAir 2 puffs 10-15 minutes before physical activity. - Rescue medications: ProAir 4 puffs every 4-6 hours as needed or albuterol nebulizer one vial every 4-6 hours as needed - Asthma control goals:  * Full participation in all desired activities (may need albuterol before activity) * Albuterol use two time or less a week on average (not counting use with activity) * Cough interfering with sleep two time or less a month * Oral steroids no more than once a year * No hospitalizations  3. GERD - Cut out caffeine, chocolate, and peppermint intake - Continue with omeprazole 40mg  in the morning. - Continue with ranitidine 300mg  at night.   4. No follow-ups on file.   Please inform us of any Emergency Department visits, hospitalizations, or changes in symptoms. Call us before going to the ED for breathing or allergy symptoms since we might be able to fit you in for a sick visit. Feel free to contact us anytime with any questions, problems,  or concerns.  It was a pleasure to see you again today!  Websites that have reliable patient information: 1. American Academy of Asthma, Allergy, and Immunology: www.aaaai.org 2. Food Allergy Research and Education (FARE): foodallergy.org 3. Mothers of Asthmatics: http://www.asthmacommunitynetwork.org 4. American College of Allergy, Asthma, and Immunology: MonthlyElectricBill.co.uk   Make sure you are registered to vote! If you have moved or changed any of your contact information, you will need to get this updated before voting!    Voter ID laws are NOT going into effect for the General Election in November 2020! DO NOT let this stop you from exercising your right to vote!
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
## 2018-03-30 ENCOUNTER — Ambulatory Visit (INDEPENDENT_AMBULATORY_CARE_PROVIDER_SITE_OTHER): Payer: Medicare PPO

## 2018-03-30 DIAGNOSIS — J309 Allergic rhinitis, unspecified: Secondary | ICD-10-CM | POA: Diagnosis not present

## 2018-03-30 NOTE — Progress Notes (Signed)
Patient is scheduled with Dr Benjamine Mola on 04/20/18 in [place] at 3:50 Patient was in office for shots so I went ahead and made the appointment.

## 2018-04-06 ENCOUNTER — Other Ambulatory Visit: Payer: Self-pay | Admitting: Allergy & Immunology

## 2018-04-07 ENCOUNTER — Ambulatory Visit: Payer: Medicare PPO | Admitting: Allergy & Immunology

## 2018-04-07 ENCOUNTER — Ambulatory Visit (INDEPENDENT_AMBULATORY_CARE_PROVIDER_SITE_OTHER): Payer: Medicare PPO | Admitting: *Deleted

## 2018-04-07 DIAGNOSIS — J309 Allergic rhinitis, unspecified: Secondary | ICD-10-CM | POA: Diagnosis not present

## 2018-04-14 ENCOUNTER — Ambulatory Visit (INDEPENDENT_AMBULATORY_CARE_PROVIDER_SITE_OTHER): Payer: Medicare PPO | Admitting: *Deleted

## 2018-04-14 DIAGNOSIS — J309 Allergic rhinitis, unspecified: Secondary | ICD-10-CM

## 2018-04-15 ENCOUNTER — Other Ambulatory Visit: Payer: Self-pay

## 2018-04-15 ENCOUNTER — Telehealth: Payer: Self-pay

## 2018-04-15 NOTE — Telephone Encounter (Signed)
Received letter from insurance stating that  budesonide-formoterol (SYMBICORT) 160-4.5 MCG/ACT inhaler is not covered. Checked and Advair or Memory Dance are preferred. Please advise of best alternate.

## 2018-04-16 MED ORDER — FLUTICASONE-SALMETEROL 115-21 MCG/ACT IN AERO: 2.0000 | INHALATION_SPRAY | Freq: Two times a day (BID) | RESPIRATORY_TRACT | 5 refills | Status: DC

## 2018-04-16 NOTE — Addendum Note (Signed)
Addended by: Valere Dross on: 04/16/2018 09:05 AM   Modules accepted: Orders

## 2018-04-16 NOTE — Telephone Encounter (Signed)
Advair Diskus and Breo would like cause more coughing with the dry powder inhaler. Is Advair HFA ok to order? If so, please send in 115/21 two puffs BID. This will need to be used with the spacer.  Thanks, Salvatore Marvel, MD [facility] [place]

## 2018-04-16 NOTE — Telephone Encounter (Signed)
Rx sent into pharmacy. [place], [place]

## 2018-04-20 ENCOUNTER — Ambulatory Visit (INDEPENDENT_AMBULATORY_CARE_PROVIDER_SITE_OTHER): Payer: Medicare PPO | Admitting: Otolaryngology

## 2018-04-20 DIAGNOSIS — R05 Cough: Secondary | ICD-10-CM

## 2018-04-21 ENCOUNTER — Other Ambulatory Visit: Payer: Self-pay

## 2018-04-21 ENCOUNTER — Ambulatory Visit (INDEPENDENT_AMBULATORY_CARE_PROVIDER_SITE_OTHER): Payer: Medicare PPO | Admitting: *Deleted

## 2018-04-21 ENCOUNTER — Encounter: Payer: Self-pay | Admitting: Internal Medicine

## 2018-04-21 ENCOUNTER — Ambulatory Visit (INDEPENDENT_AMBULATORY_CARE_PROVIDER_SITE_OTHER): Payer: Medicare PPO | Admitting: Internal Medicine

## 2018-04-21 DIAGNOSIS — D510 Vitamin B12 deficiency anemia due to intrinsic factor deficiency: Secondary | ICD-10-CM

## 2018-04-21 DIAGNOSIS — J309 Allergic rhinitis, unspecified: Secondary | ICD-10-CM | POA: Diagnosis not present

## 2018-04-21 DIAGNOSIS — K219 Gastro-esophageal reflux disease without esophagitis: Secondary | ICD-10-CM | POA: Diagnosis not present

## 2018-04-21 DIAGNOSIS — J4520 Mild intermittent asthma, uncomplicated: Secondary | ICD-10-CM | POA: Diagnosis not present

## 2018-04-21 MED ORDER — FAMOTIDINE 40 MG PO TABS: 40.0000 mg | ORAL_TABLET | Freq: Every day | ORAL | 3 refills | Status: DC

## 2018-04-21 NOTE — Assessment & Plan Note (Signed)
The pt saw ENT yesterday: given Gabapentin for cough

## 2018-04-21 NOTE — Assessment & Plan Note (Signed)
Zantac - d/c Pepcid 

## 2018-04-21 NOTE — Assessment & Plan Note (Signed)
On B12 

## 2018-04-21 NOTE — Progress Notes (Signed)
Subjective:  Patient ID: [name], female    DOB: 01/06/1951  Age: 68 y.o. MRN: [id number]  CC: No chief complaint on file.   HPI Tahara R Sporer presents for asthma, HTN, GERD f/u. The pt saw ENT yesterday: given Gabapentin for cough  Outpatient Medications Prior to Visit  Medication Sig Dispense Refill   albuterol (PROVENTIL HFA;VENTOLIN HFA) 108 (90 Base) MCG/ACT inhaler Inhale 2 puffs into the lungs every 6 (six) hours as needed for wheezing or shortness of breath. 1 Inhaler 6   albuterol (PROVENTIL) (2.5 MG/3ML) 0.083% nebulizer solution Take 3 mLs (2.5 mg total) by nebulization every 4 (four) hours as needed for wheezing or shortness of breath. 75 mL 1   aspirin 81 MG tablet Take 81 mg by mouth daily.      Azelastine HCl 0.15 % SOLN Place 2 sprays into both nostrils 2 (two) times daily. 30 mL 5   Azelastine-Fluticasone (DYMISTA) 137-50 MCG/ACT SUSP Place 2 sprays into both nostrils at bedtime. 1 Bottle 1   benzonatate (TESSALON) 100 MG capsule Take 1 capsule (100 mg total) by mouth 3 (three) times daily as needed for cough. 20 capsule 3   budesonide-formoterol (SYMBICORT) 160-4.5 MCG/ACT inhaler Inhale 2 puffs into the lungs 2 (two) times daily. Rinse, gargle and spit out after use 1 Inhaler 5   cetirizine (ZYRTEC) 10 MG tablet Take 1 tablet (10 mg total) by mouth daily. 100 tablet 3   Cholecalciferol (VITAMIN D) 1000 UNITS capsule Take 1,000 Units by mouth every morning.      Cyanocobalamin (VITAMIN B-12) 1000 MCG SUBL Place 1 tablet (1,000 mcg total) under the tongue daily. 100 tablet 3   EPINEPHrine 0.3 mg/0.3 mL IJ SOAJ injection Use as directed for severe allergic reaction 2 Device 1   ferrous sulfate 325 (65 FE) MG tablet Take 325 mg by mouth 3 (three) times daily with meals.      fluticasone (FLONASE) 50 MCG/ACT nasal spray Place 2 sprays into both nostrils 2 (two) times daily. 16 g 3   fluticasone-salmeterol (ADVAIR HFA) 115-21 MCG/ACT inhaler Inhale  2 puffs into the lungs 2 (two) times daily. 1 Inhaler 5   gabapentin (NEURONTIN) 300 MG capsule Take 300 mg by mouth 2 (two) times daily.     guaiFENesin-codeine 100-10 MG/5ML syrup Take 10 mLs by mouth 3 (three) times daily as needed for cough. 120 mL 0   hydrochlorothiazide (HYDRODIURIL) 25 MG tablet TAKE 1 TABLET BY MOUTH ONCE DAILY 90 tablet 1   ipratropium (ATROVENT) 0.06 % nasal spray Place 1 spray into both nostrils 4 (four) times daily. 15 mL 5   irbesartan (AVAPRO) 150 MG tablet TAKE 1 TABLET BY MOUTH ONCE DAILY 90 tablet 1   irbesartan-hydrochlorothiazide (AVALIDE) 150-12.5 MG tablet Take 1 tablet by mouth daily. 90 tablet 3   levocetirizine (XYZAL) 5 MG tablet TAKE 1 TABLET BY MOUTH ONCE DAILY IN THE EVENING 30 tablet 0   methocarbamol (ROBAXIN) 500 MG tablet Take 1 tablet (500 mg total) by mouth every 8 (eight) hours as needed for muscle spasms. Muscle spasms 60 tablet 0   montelukast (SINGULAIR) 10 MG tablet TAKE 1 TABLET BY MOUTH ONCE DAILY WITH  BREAKFAST 90 tablet 1   omeprazole (PRILOSEC) 20 MG capsule TAKE 1 CAPSULE BY MOUTH ONCE DAILY 90 capsule 1   omeprazole (PRILOSEC) 40 MG capsule Take 1 capsule (40 mg total) by mouth daily. 90 capsule 1   potassium chloride (K-DUR) 10 MEQ tablet TAKE 1 TABLET BY  MOUTH ONCE DAILY 90 tablet 1   ranitidine (ZANTAC) 300 MG tablet Take 1 tablet (300 mg total) by mouth at bedtime. 90 tablet 1   furosemide (LASIX) 40 MG tablet Take 1 tablet (40 mg total) by mouth daily as needed. 30 tablet 3   No facility-administered medications prior to visit.     ROS: Review of Systems  Constitutional: Negative for activity change, appetite change, chills, fatigue and unexpected weight change.  HENT: Negative for congestion, mouth sores and sinus pressure.   Eyes: Negative for visual disturbance.  Respiratory: Negative for cough and chest tightness.   Gastrointestinal: Negative for abdominal pain and nausea.  Genitourinary: Negative for  difficulty urinating, frequency and vaginal pain.  Musculoskeletal: Negative for back pain and gait problem.  Skin: Negative for pallor and rash.  Neurological: Negative for dizziness, tremors, weakness, numbness and headaches.  Psychiatric/Behavioral: Negative for confusion, sleep disturbance and suicidal ideas.    Objective:  BP 120/74 (BP Location: Left Arm, Patient Position: Sitting, Cuff Size: Large)    Pulse 83    Temp (!) 97.5 F (36.4 C) (Oral)    Ht 5\' 2"  (1.575 m)    Wt 228 lb (103.4 kg)    SpO2 96%    BMI 41.70 kg/m   BP Readings from Last 3 Encounters:  04/21/18 120/74  03/27/18 124/78  03/11/18 114/80    Wt Readings from Last 3 Encounters:  04/21/18 228 lb (103.4 kg)  03/11/18 226 lb 6.4 oz (102.7 kg)  12/22/17 225 lb (102.1 kg)    Physical Exam Constitutional:      General: She is not in acute distress.    Appearance: She is well-developed.  HENT:     Head: Normocephalic.     Right Ear: External ear normal.     Left Ear: External ear normal.     Nose: Nose normal.  Eyes:     General:        Right eye: No discharge.        Left eye: No discharge.     Conjunctiva/sclera: Conjunctivae normal.     Pupils: Pupils are equal, round, and reactive to light.  Neck:     Musculoskeletal: Normal range of motion and neck supple.     Thyroid: No thyromegaly.     Vascular: No JVD.     Trachea: No tracheal deviation.  Cardiovascular:     Rate and Rhythm: Normal rate and regular rhythm.     Heart sounds: Normal heart sounds.  Pulmonary:     Effort: No respiratory distress.     Breath sounds: No stridor. No wheezing.  Abdominal:     General: Bowel sounds are normal. There is no distension.     Palpations: Abdomen is soft. There is no mass.     Tenderness: There is no abdominal tenderness. There is no guarding or rebound.  Musculoskeletal:        General: No tenderness.  Lymphadenopathy:     Cervical: No cervical adenopathy.  Skin:    Findings: No erythema or  rash.  Neurological:     Cranial Nerves: No cranial nerve deficit.     Motor: No abnormal muscle tone.     Coordination: Coordination normal.     Deep Tendon Reflexes: Reflexes normal.  Psychiatric:        Behavior: Behavior normal.        Thought Content: Thought content normal.        Judgment: Judgment normal.   obese  Lab Results  Component Value Date   WBC 5.3 09/09/2017   HGB 11.6 09/09/2017   HCT 35.9 09/09/2017   PLT 268 09/09/2017   GLUCOSE 100 (H) 03/09/2018   CHOL 144 07/14/2012   TRIG 59.0 07/14/2012   HDL 76.60 07/14/2012   LDLCALC 56 07/14/2012   ALT 14 11/19/2016   AST 13 11/19/2016   NA 142 03/09/2018   K 3.5 03/09/2018   CL 104 03/09/2018   CREATININE 0.89 03/09/2018   BUN 14 03/09/2018   CO2 28 03/09/2018   TSH 1.91 01/27/2013   INR 1.07 02/02/2013   HGBA1C 5.5 09/20/2013    Ct Chest W Contrast  Result Date: 03/09/2018 CLINICAL DATA:  Colon cancer, abdominal hernia repair. EXAM: CT CHEST, ABDOMEN, AND PELVIS WITH CONTRAST TECHNIQUE: Multidetector CT imaging of the chest, abdomen and pelvis was performed following the standard protocol during bolus administration of intravenous contrast. CONTRAST:  140mL OMNIPAQUE IOHEXOL 300 MG/ML  SOLN COMPARISON:  CT abdomen pelvis 02/04/2015 and CT chest 02/01/2014. FINDINGS: CT CHEST FINDINGS Cardiovascular: Heart is at the upper limits of normal in size to mildly enlarged. No pericardial effusion. Mediastinum/Nodes: No pathologically enlarged mediastinal, hilar or axillary lymph nodes. Esophagus is grossly unremarkable. Lungs/Pleura: 2 mm nodule in the posterior right upper lobe (series 4, image 51), nonspecific. No pleural fluid. Airway is unremarkable. Musculoskeletal: Degenerative changes in the spine and shoulders. No worrisome lytic or sclerotic lesions. CT ABDOMEN PELVIS FINDINGS Hepatobiliary: Liver is unremarkable. Cholecystectomy. No biliary ductal dilatation. Pancreas: Negative. Spleen: Negative.  Adrenals/Urinary Tract: Adrenal glands are unremarkable. Low-attenuation lesion in the right kidney measures 1.4 cm and is difficult to further characterize due to size. Ureters are decompressed. Bladder is grossly unremarkable. Stomach/Bowel: Small hiatal hernia. Stomach is otherwise unremarkable. An anastomosis is seen in small bowel in the left abdomen with probable mild associated short segment atony and dilatation. A loop of unobstructed small bowel extends into a low right spigelian hernia. Left hemicolectomy. Colon is otherwise unremarkable. Vascular/Lymphatic: Circumaortic left renal vein. Vascular structures are otherwise unremarkable. Periportal lymph nodes measure up to 11 mm, as before. Reproductive: Hysterectomy.  No adnexal mass. Other: No free fluid.  Mesenteries and peritoneum are unremarkable. Musculoskeletal: Degenerative changes in the spine and sacroiliac joints. Grade 1 anterolisthesis of L4 on L5. IMPRESSION: 1. No evidence of recurrent or metastatic disease. 2. 2 mm posterior right upper lobe nodule, nonspecific. Continued attention on follow-up exams is suggested. 3. Right pelvic spigelian hernia contains unobstructed small bowel. 4. Electronically Signed   By: Lorin Picket M.D.   On: 03/09/2018 15:30   Ct Abdomen Pelvis W Contrast  Result Date: 03/09/2018 CLINICAL DATA:  Colon cancer, abdominal hernia repair. EXAM: CT CHEST, ABDOMEN, AND PELVIS WITH CONTRAST TECHNIQUE: Multidetector CT imaging of the chest, abdomen and pelvis was performed following the standard protocol during bolus administration of intravenous contrast. CONTRAST:  137mL OMNIPAQUE IOHEXOL 300 MG/ML  SOLN COMPARISON:  CT abdomen pelvis 02/04/2015 and CT chest 02/01/2014. FINDINGS: CT CHEST FINDINGS Cardiovascular: Heart is at the upper limits of normal in size to mildly enlarged. No pericardial effusion. Mediastinum/Nodes: No pathologically enlarged mediastinal, hilar or axillary lymph nodes. Esophagus is grossly  unremarkable. Lungs/Pleura: 2 mm nodule in the posterior right upper lobe (series 4, image 51), nonspecific. No pleural fluid. Airway is unremarkable. Musculoskeletal: Degenerative changes in the spine and shoulders. No worrisome lytic or sclerotic lesions. CT ABDOMEN PELVIS FINDINGS Hepatobiliary: Liver is unremarkable. Cholecystectomy. No biliary ductal dilatation. Pancreas: Negative. Spleen: Negative. Adrenals/Urinary Tract:  Adrenal glands are unremarkable. Low-attenuation lesion in the right kidney measures 1.4 cm and is difficult to further characterize due to size. Ureters are decompressed. Bladder is grossly unremarkable. Stomach/Bowel: Small hiatal hernia. Stomach is otherwise unremarkable. An anastomosis is seen in small bowel in the left abdomen with probable mild associated short segment atony and dilatation. A loop of unobstructed small bowel extends into a low right spigelian hernia. Left hemicolectomy. Colon is otherwise unremarkable. Vascular/Lymphatic: Circumaortic left renal vein. Vascular structures are otherwise unremarkable. Periportal lymph nodes measure up to 11 mm, as before. Reproductive: Hysterectomy.  No adnexal mass. Other: No free fluid.  Mesenteries and peritoneum are unremarkable. Musculoskeletal: Degenerative changes in the spine and sacroiliac joints. Grade 1 anterolisthesis of L4 on L5. IMPRESSION: 1. No evidence of recurrent or metastatic disease. 2. 2 mm posterior right upper lobe nodule, nonspecific. Continued attention on follow-up exams is suggested. 3. Right pelvic spigelian hernia contains unobstructed small bowel. 4. Electronically Signed   By: Lorin Picket M.D.   On: 03/09/2018 15:30    Assessment & Plan:   There are no diagnoses linked to this encounter.   No orders of the defined types were placed in this encounter.    Follow-up: No follow-ups on file.  Walker Kehr, MD

## 2018-05-01 ENCOUNTER — Ambulatory Visit (INDEPENDENT_AMBULATORY_CARE_PROVIDER_SITE_OTHER): Payer: Medicare PPO | Admitting: *Deleted

## 2018-05-01 DIAGNOSIS — J309 Allergic rhinitis, unspecified: Secondary | ICD-10-CM | POA: Diagnosis not present

## 2018-05-04 ENCOUNTER — Ambulatory Visit (INDEPENDENT_AMBULATORY_CARE_PROVIDER_SITE_OTHER): Payer: Medicare PPO | Admitting: *Deleted

## 2018-05-04 DIAGNOSIS — J309 Allergic rhinitis, unspecified: Secondary | ICD-10-CM

## 2018-05-08 ENCOUNTER — Other Ambulatory Visit: Payer: Self-pay | Admitting: Allergy & Immunology

## 2018-05-12 ENCOUNTER — Ambulatory Visit (INDEPENDENT_AMBULATORY_CARE_PROVIDER_SITE_OTHER): Payer: Medicare PPO | Admitting: *Deleted

## 2018-05-12 DIAGNOSIS — J309 Allergic rhinitis, unspecified: Secondary | ICD-10-CM | POA: Diagnosis not present

## 2018-05-19 ENCOUNTER — Ambulatory Visit (INDEPENDENT_AMBULATORY_CARE_PROVIDER_SITE_OTHER): Payer: Medicare PPO | Admitting: *Deleted

## 2018-05-19 DIAGNOSIS — J309 Allergic rhinitis, unspecified: Secondary | ICD-10-CM

## 2018-05-26 ENCOUNTER — Ambulatory Visit (INDEPENDENT_AMBULATORY_CARE_PROVIDER_SITE_OTHER): Payer: Medicare PPO | Admitting: *Deleted

## 2018-05-26 DIAGNOSIS — J309 Allergic rhinitis, unspecified: Secondary | ICD-10-CM

## 2018-06-02 ENCOUNTER — Ambulatory Visit (INDEPENDENT_AMBULATORY_CARE_PROVIDER_SITE_OTHER): Payer: Medicare PPO | Admitting: *Deleted

## 2018-06-02 DIAGNOSIS — J309 Allergic rhinitis, unspecified: Secondary | ICD-10-CM | POA: Diagnosis not present

## 2018-06-09 ENCOUNTER — Ambulatory Visit (INDEPENDENT_AMBULATORY_CARE_PROVIDER_SITE_OTHER): Payer: Medicare PPO

## 2018-06-09 DIAGNOSIS — J309 Allergic rhinitis, unspecified: Secondary | ICD-10-CM | POA: Diagnosis not present

## 2018-06-10 NOTE — Progress Notes (Signed)
VIALS EXP 06-10-2019 

## 2018-06-11 DIAGNOSIS — J302 Other seasonal allergic rhinitis: Secondary | ICD-10-CM | POA: Diagnosis not present

## 2018-06-15 ENCOUNTER — Ambulatory Visit (INDEPENDENT_AMBULATORY_CARE_PROVIDER_SITE_OTHER): Payer: Medicare PPO

## 2018-06-15 DIAGNOSIS — J309 Allergic rhinitis, unspecified: Secondary | ICD-10-CM

## 2018-06-24 ENCOUNTER — Ambulatory Visit: Payer: Medicare PPO | Admitting: Allergy & Immunology

## 2018-06-30 ENCOUNTER — Ambulatory Visit (INDEPENDENT_AMBULATORY_CARE_PROVIDER_SITE_OTHER): Payer: Medicare PPO | Admitting: *Deleted

## 2018-06-30 DIAGNOSIS — J309 Allergic rhinitis, unspecified: Secondary | ICD-10-CM

## 2018-07-07 DIAGNOSIS — M85811 Other specified disorders of bone density and structure, right shoulder: Secondary | ICD-10-CM | POA: Diagnosis not present

## 2018-07-07 DIAGNOSIS — Z85038 Personal history of other malignant neoplasm of large intestine: Secondary | ICD-10-CM | POA: Diagnosis not present

## 2018-07-07 DIAGNOSIS — S301XXA Contusion of abdominal wall, initial encounter: Secondary | ICD-10-CM | POA: Diagnosis not present

## 2018-07-07 DIAGNOSIS — J45909 Unspecified asthma, uncomplicated: Secondary | ICD-10-CM | POA: Diagnosis not present

## 2018-07-07 DIAGNOSIS — S161XXA Strain of muscle, fascia and tendon at neck level, initial encounter: Secondary | ICD-10-CM | POA: Diagnosis not present

## 2018-07-07 DIAGNOSIS — S40011A Contusion of right shoulder, initial encounter: Secondary | ICD-10-CM | POA: Diagnosis not present

## 2018-07-07 DIAGNOSIS — I10 Essential (primary) hypertension: Secondary | ICD-10-CM | POA: Diagnosis not present

## 2018-07-08 DIAGNOSIS — J45909 Unspecified asthma, uncomplicated: Secondary | ICD-10-CM | POA: Diagnosis not present

## 2018-07-08 DIAGNOSIS — Z85038 Personal history of other malignant neoplasm of large intestine: Secondary | ICD-10-CM | POA: Diagnosis not present

## 2018-07-08 DIAGNOSIS — S301XXA Contusion of abdominal wall, initial encounter: Secondary | ICD-10-CM | POA: Diagnosis not present

## 2018-07-08 DIAGNOSIS — M85811 Other specified disorders of bone density and structure, right shoulder: Secondary | ICD-10-CM | POA: Diagnosis not present

## 2018-07-08 DIAGNOSIS — S161XXA Strain of muscle, fascia and tendon at neck level, initial encounter: Secondary | ICD-10-CM | POA: Diagnosis not present

## 2018-07-08 DIAGNOSIS — S40011A Contusion of right shoulder, initial encounter: Secondary | ICD-10-CM | POA: Diagnosis not present

## 2018-07-08 DIAGNOSIS — I10 Essential (primary) hypertension: Secondary | ICD-10-CM | POA: Diagnosis not present

## 2018-07-13 ENCOUNTER — Ambulatory Visit (INDEPENDENT_AMBULATORY_CARE_PROVIDER_SITE_OTHER): Payer: Medicare PPO | Admitting: Internal Medicine

## 2018-07-13 ENCOUNTER — Ambulatory Visit (INDEPENDENT_AMBULATORY_CARE_PROVIDER_SITE_OTHER): Payer: Medicare PPO | Admitting: *Deleted

## 2018-07-13 ENCOUNTER — Encounter: Payer: Self-pay | Admitting: Internal Medicine

## 2018-07-13 ENCOUNTER — Other Ambulatory Visit: Payer: Self-pay

## 2018-07-13 DIAGNOSIS — J309 Allergic rhinitis, unspecified: Secondary | ICD-10-CM

## 2018-07-13 DIAGNOSIS — M25511 Pain in right shoulder: Secondary | ICD-10-CM

## 2018-07-13 DIAGNOSIS — G8929 Other chronic pain: Secondary | ICD-10-CM | POA: Diagnosis not present

## 2018-07-13 DIAGNOSIS — M545 Low back pain: Secondary | ICD-10-CM | POA: Diagnosis not present

## 2018-07-13 DIAGNOSIS — S161XXA Strain of muscle, fascia and tendon at neck level, initial encounter: Secondary | ICD-10-CM | POA: Diagnosis not present

## 2018-07-13 MED ORDER — HYDROCODONE-ACETAMINOPHEN 5-325 MG PO TABS: 1.0000 | ORAL_TABLET | Freq: Two times a day (BID) | ORAL | 0 refills | Status: DC | PRN

## 2018-07-13 NOTE — Addendum Note (Signed)
Addended by: Karren Cobble on: 07/13/2018 03:32 PM   Modules accepted: Orders

## 2018-07-13 NOTE — Patient Instructions (Signed)
Rice sock
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
## 2018-07-13 NOTE — Addendum Note (Signed)
Addended by: Cassandria Anger on: 07/13/2018 04:49 PM   Modules accepted: Orders
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
## 2018-07-17 ENCOUNTER — Other Ambulatory Visit: Payer: Self-pay | Admitting: Internal Medicine

## 2018-07-17 NOTE — Telephone Encounter (Signed)
Medication Refill - Medication: HYDROcodone-acetaminophen (NORCO/VICODIN) 5-325 MG tablet  Has the patient contacted their pharmacy? Yes - she was only able to get 14 the first time and the rest must be sent in as a second RX.  Pt states she thinks she was supposed to get a total of 60 pills (Agent: If no, request that the patient contact the pharmacy for the refill.) (Agent: If yes, when and what did the pharmacy advise?)  Preferred Pharmacy (with phone number or street name):  [place], [place] [address] [phone] (Phone) [phone] (Fax)   Agent: Please be advised that RX refills may take up to 3 business days. We ask that you follow-up with your pharmacy.

## 2018-07-20 NOTE — Telephone Encounter (Signed)
Waiting on provider to approve. 

## 2018-07-20 NOTE — Telephone Encounter (Signed)
Pt called in to follow up on request. Pt says that she took her last pill on Saturday and would like to have Rx as soon as possible.   Please assist.

## 2018-07-21 ENCOUNTER — Ambulatory Visit: Payer: Medicare PPO | Admitting: Internal Medicine

## 2018-07-21 MED ORDER — HYDROCODONE-ACETAMINOPHEN 5-325 MG PO TABS: 1.0000 | ORAL_TABLET | Freq: Two times a day (BID) | ORAL | 0 refills | Status: DC | PRN

## 2018-07-21 NOTE — Telephone Encounter (Signed)
Pt calling to check status. Pt would like a call with decision. Please advise   [phone]

## 2018-07-28 ENCOUNTER — Ambulatory Visit (INDEPENDENT_AMBULATORY_CARE_PROVIDER_SITE_OTHER): Payer: Medicare PPO | Admitting: *Deleted

## 2018-07-28 DIAGNOSIS — J309 Allergic rhinitis, unspecified: Secondary | ICD-10-CM

## 2018-07-29 ENCOUNTER — Other Ambulatory Visit: Payer: Self-pay

## 2018-07-29 ENCOUNTER — Encounter: Payer: Self-pay | Admitting: Allergy & Immunology

## 2018-07-29 ENCOUNTER — Ambulatory Visit (INDEPENDENT_AMBULATORY_CARE_PROVIDER_SITE_OTHER): Payer: Medicare PPO | Admitting: Allergy & Immunology

## 2018-07-29 DIAGNOSIS — J3089 Other allergic rhinitis: Secondary | ICD-10-CM | POA: Diagnosis not present

## 2018-07-29 DIAGNOSIS — J302 Other seasonal allergic rhinitis: Secondary | ICD-10-CM | POA: Diagnosis not present

## 2018-07-29 DIAGNOSIS — R05 Cough: Secondary | ICD-10-CM

## 2018-07-29 DIAGNOSIS — R053 Chronic cough: Secondary | ICD-10-CM

## 2018-07-29 DIAGNOSIS — J4540 Moderate persistent asthma, uncomplicated: Secondary | ICD-10-CM

## 2018-07-29 NOTE — Progress Notes (Signed)
RE: SHALEKA BRINES MRN: [id number] DOB: [birth year] Date of Telemedicine Visit: 07/29/2018  Referring provider: Cassandria Anger, MD Primary care provider: Cassandria Anger, MD  Chief Complaint: Asthma   Telemedicine Follow Up Visit via Telephone: I connected with Alan Riles for a follow up on 07/29/18 by telephone and verified that I am speaking with the correct person using two identifiers.   I discussed the limitations, risks, security and privacy concerns of performing an evaluation and management service by telephone and the availability of in person appointments. I also discussed with the patient that there may be a patient responsible charge related to this service. The patient expressed understanding and agreed to proceed.  Patient is at home.  Provider is at the office.  Visit start time: 10:50 AM Visit end time: 11:15 AM Insurance consent/check in by: [employer] consent and medical assistant/nurse: Garlon Hatchet  History of Present Illness:  She is a 68 y.o. female, who is being followed for persistent asthma as well as P/SAR. We have also been trying to deal with her cough for nearly a year or so. Her previous allergy office visit was in February 2020 with Dr. Ernst Bowler.  She was last seen in February 2020.  At that time, we continue with her allergy shots at the same schedule.  We also continued with Dymista and nasal Atrovent.  She is also on Xyzal 5 mg daily.  For her asthma, we did not do any breathing test since she was coughing.  We did not give her more steroids.  We did give her an opioid-containing cough medicine to bridge her until her ENT evaluation.  We continued Symbicort 160/4.5 mcg 2 puffs twice daily.  For her reflux, we recommended cutting out caffeine, chocolate, and peppermint.  We continued the omeprazole 40 mg in the morning and Zantac 300 mg at night.  In the interim, she was scheduled to see Dr. Benjamine Mola on March 23 in [place].  He felt that her  coughing was likely neurogenic in origin.  She was started on Neurontin.  She has now increased her dose to twice daily.  Since the last visit she has done well.  She feels that the addition of the Neurontin has helped to calm her cough.  She has not taken it twice daily.  However, a few days ago, the cough resumed.  She is requesting a hydrocodone-containing cough medicine.  She has not talked to Dr. Benjamine Mola about the cough, but is willing to give them a call.  She has had no fever.  She denies loss of sensation of smell or taste.   Asthma Symptom History: She remains on the Symbicort 160/4.5 mcg 2 puffs twice daily.  She is also on the albuterol as needed.  She has not needed to use the albuterol in quite some time.  She has not required any prednisone or ER visits for her asthma.  She reports good sleep at night.  She continues to complain about the cough, which emerged in the last few days.  However, she does not cough at all during the visit today.  Allergic Rhinitis Symptom History: She remains on Dymista as well as allergy shots.  She also takes Xyzal.  She has not been using Atrovent much at all.  She denies any problems with the allergy shots.  Apparently, she got a new vial and is now coming weekly again until she builds back up to her maintenance.   She has been keeping safe during  the coronavirus pandemic.  She does wear a mask when she is outdoors or out of her home at all.  Otherwise, there have been no changes to her past medical history, surgical history, family history, or social history.  Assessment and Plan:  Vernie is a 68 y.o. 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 Benjamine Mola)   Asthma Reportables:  Severity:moderate persistent Risk:high Control:not well controlled    1. Seasonal and perennial allergic rhinitis (trees, weeds, grasses, molds, dust mites, cat, dog and cockroach) - Continue allergy shots at the same schedule. - Continue with Dymista (fluticasone/azelastine) two sprays per nostril 1-2 times daily as needed - Continue with ipratripium one spray per nostril every 6 hours as needed and Xyzal (levocetirizine) 5mg  tablet once daily - You can use an extra dose of the antihistamine, if needed, for breakthrough symptoms.  - Consider nasal saline rinses 1-2 times daily to remove allergens from the nasal cavities as well as help with mucous clearance (this is especially helpful to do before the nasal sprays are given). - Continue saline nasal rinses - Continue with Tessalon Perles 100 mg every 8 hours as needed for the cough  2. Cough  - Continue with Neurontin twice daily. - Call Dr. Benjamine Mola with an update.   3. Moderate persistent asthma, uncomplicated - We did not do lung testing since you were not here in person.  - We will not make any medication changes at this time. - We will continue you on Symbicort.  - Spacer use reviewed. - Daily controller medication(s): Symbicort 160/4.33mcg two puffs twice daily with spacer - Prior to physical activity: albuterol 2 puffs 10-15 minutes before physical activity. - Rescue medications: albuterol 4 puffs every 4-6 hours as needed - Asthma control goals:  * Full participation in all desired activities (may need albuterol before activity) * Albuterol use two time or less a week on average (not counting use with activity) * Cough interfering with sleep two time or less a month * Oral steroids no more than once a year * No hospitalization  4. GERD - Cut out caffeine, chocolate, and peppermint intake - Continue with omeprazole 40mg  in the morning. - Continue with ranitidine 300mg  at night.   5.  Follow-up in 3 months or earlier if  needed.    Diagnostics: None.  Medication List:  Current Outpatient Medications  Medication Sig Dispense Refill  . albuterol (PROVENTIL HFA;VENTOLIN HFA) 108 (90 Base) MCG/ACT inhaler Inhale 2 puffs into the lungs every 6 (six) hours as needed for wheezing or shortness of breath. 1 Inhaler 6  . albuterol (PROVENTIL) (2.5 MG/3ML) 0.083% nebulizer solution Take 3 mLs (2.5 mg total) by nebulization every 4 (four) hours as needed for wheezing or shortness of breath. 75 mL 1  . aspirin 81 MG tablet Take 81 mg by mouth daily.     . Azelastine HCl 0.15 % SOLN Place 2 sprays into both nostrils 2 (two) times daily. 30 mL 5  . Azelastine-Fluticasone (DYMISTA) 137-50 MCG/ACT SUSP Place 2 sprays into both nostrils at bedtime. 1 Bottle 1  . benzonatate (TESSALON) 100 MG capsule Take 1 capsule (100 mg total) by mouth 3 (three) times daily as needed for cough. 20 capsule 3  . budesonide-formoterol (SYMBICORT) 160-4.5 MCG/ACT inhaler Inhale 2  puffs into the lungs 2 (two) times daily. Rinse, gargle and spit out after use 1 Inhaler 5  . cetirizine (ZYRTEC) 10 MG tablet Take 1 tablet (10 mg total) by mouth daily. 100 tablet 3  . Cholecalciferol (VITAMIN D) 1000 UNITS capsule Take 1,000 Units by mouth every morning.     . Cyanocobalamin (VITAMIN B-12) 1000 MCG SUBL Place 1 tablet (1,000 mcg total) under the tongue daily. 100 tablet 3  . EPINEPHrine 0.3 mg/0.3 mL IJ SOAJ injection Use as directed for severe allergic reaction 2 Device 1  . famotidine (PEPCID) 40 MG tablet Take 1 tablet (40 mg total) by mouth daily. 90 tablet 3  . ferrous sulfate 325 (65 FE) MG tablet Take 325 mg by mouth 3 (three) times daily with meals.     . fluticasone (FLONASE) 50 MCG/ACT nasal spray Place 2 sprays into both nostrils 2 (two) times daily. 16 g 3  . fluticasone-salmeterol (ADVAIR HFA) 115-21 MCG/ACT inhaler Inhale 2 puffs into the lungs 2 (two) times daily. 1 Inhaler 5  . gabapentin (NEURONTIN) 300 MG capsule Take 300 mg by  mouth 2 (two) times daily.    Marland Kitchen guaiFENesin-codeine 100-10 MG/5ML syrup Take 10 mLs by mouth 3 (three) times daily as needed for cough. 120 mL 0  . hydrochlorothiazide (HYDRODIURIL) 25 MG tablet TAKE 1 TABLET BY MOUTH ONCE DAILY 90 tablet 1  . HYDROcodone-acetaminophen (NORCO/VICODIN) 5-325 MG tablet Take 1 tablet by mouth 2 (two) times daily as needed for severe pain. 60 tablet 0  . ipratropium (ATROVENT) 0.06 % nasal spray Place 1 spray into both nostrils 4 (four) times daily. 15 mL 5  . irbesartan (AVAPRO) 150 MG tablet TAKE 1 TABLET BY MOUTH ONCE DAILY 90 tablet 1  . irbesartan-hydrochlorothiazide (AVALIDE) 150-12.5 MG tablet Take 1 tablet by mouth daily. 90 tablet 3  . levocetirizine (XYZAL) 5 MG tablet TAKE 1 TABLET BY MOUTH ONCE DAILY IN THE EVENING 30 tablet 3  . methocarbamol (ROBAXIN) 500 MG tablet Take 1 tablet (500 mg total) by mouth every 8 (eight) hours as needed for muscle spasms. Muscle spasms 60 tablet 0  . montelukast (SINGULAIR) 10 MG tablet TAKE 1 TABLET BY MOUTH ONCE DAILY WITH  BREAKFAST 90 tablet 1  . omeprazole (PRILOSEC) 20 MG capsule TAKE 1 CAPSULE BY MOUTH ONCE DAILY 90 capsule 1  . omeprazole (PRILOSEC) 40 MG capsule Take 1 capsule (40 mg total) by mouth daily. 90 capsule 1  . potassium chloride (K-DUR) 10 MEQ tablet TAKE 1 TABLET BY MOUTH ONCE DAILY 90 tablet 1  . furosemide (LASIX) 40 MG tablet Take 1 tablet (40 mg total) by mouth daily as needed. 30 tablet 3   No current facility-administered medications for this visit.    Allergies: No Known Allergies I reviewed her past medical history, social history, family history, and environmental history and no significant changes have been reported from previous visits.  Review of Systems  Constitutional: Negative for activity change, appetite change, chills, fatigue and fever.  HENT: Negative for congestion, postnasal drip, rhinorrhea, sinus pressure, sinus pain, sneezing and sore throat.   Eyes: Negative for pain,  discharge, redness and itching.  Respiratory: Positive for cough. Negative for shortness of breath, wheezing and stridor.   Gastrointestinal: Negative for diarrhea, nausea and vomiting.  Endocrine: Negative for cold intolerance and heat intolerance.  Musculoskeletal: Negative for arthralgias, joint swelling and myalgias.  Skin: Negative for rash.  Allergic/Immunologic: Negative for environmental allergies and food allergies.    Objective:  Physical exam not obtained as encounter was done via telephone.   Previous notes and tests were reviewed.  I discussed the assessment and treatment plan with the patient. The patient was provided an opportunity to ask questions and all were answered. The patient agreed with the plan and demonstrated an understanding of the instructions.   The patient was advised to call back or seek an in-person evaluation if the symptoms worsen or if the condition fails to improve as anticipated.  I provided 25 minutes of non-face-to-face time during this encounter.  It was my pleasure to participate in [place] care today. Please feel free to contact me with any questions or concerns.   Sincerely,  Valentina Shaggy, MD

## 2018-07-29 NOTE — Patient Instructions (Signed)
1. Seasonal and perennial allergic rhinitis (trees, weeds, grasses, molds, dust mites, cat, dog and cockroach) - Continue allergy shots at the same schedule. - Continue with Dymista (fluticasone/azelastine) two sprays per nostril 1-2 times daily as needed - Continue with ipratripium one spray per nostril every 6 hours as needed and Xyzal (levocetirizine) 5mg  tablet once daily - You can use an extra dose of the antihistamine, if needed, for breakthrough symptoms.  - Consider nasal saline rinses 1-2 times daily to remove allergens from the nasal cavities as well as help with mucous clearance (this is especially helpful to do before the nasal sprays are given). - Continue saline nasal rinses - Continue with Tessalon Perles 100 mg every 8 hours as needed for the cough  2. Moderate persistent asthma, uncomplicated - We did not do lung testing since you were not here in person.  - We will not make any medication changes at this time. - We will continue you on Symbicort.  - Spacer use reviewed. - Daily controller medication(s): Symbicort 160/4.20mcg two puffs twice daily with spacer - Prior to physical activity: albuterol 2 puffs 10-15 minutes before physical activity. - Rescue medications: albuterol 4 puffs every 4-6 hours as needed - Asthma control goals:  * Full participation in all desired activities (may need albuterol before activity) * Albuterol use two time or less a week on average (not counting use with activity) * Cough interfering with sleep two time or less a month * Oral steroids no more than once a year * No hospitalization  3. GERD - Cut out caffeine, chocolate, and peppermint intake - Continue with omeprazole 40mg  in the morning. - Continue with ranitidine 300mg  at night.    4. Return in about 3 months (around 10/29/2018). This can be an in-person, a virtual Webex or a telephone follow up visit.   Please inform us of any Emergency Department visits, hospitalizations, or  changes in symptoms. Call us before going to the ED for breathing or allergy symptoms since we might be able to fit you in for a sick visit. Feel free to contact us anytime with any questions, problems, or concerns.  It was a pleasure to talk to you today today!  Websites that have reliable patient information: 1. American Academy of Asthma, Allergy, and Immunology: www.aaaai.org 2. Food Allergy Research and Education (FARE): foodallergy.org 3. Mothers of Asthmatics: http://www.asthmacommunitynetwork.org 4. American College of Allergy, Asthma, and Immunology: www.acaai.org  "Like" [place] on Facebook and Instagram for our latest updates!      Make sure you are registered to vote! If you have moved or changed any of your contact information, you will need to get this updated before voting!  In some cases, you MAY be able to register to vote online: [email or web address]    Voter ID laws are NOT going into effect for the General Election in November 2020! DO NOT let this stop you from exercising your right to vote!   Absentee voting is the SAFEST way to vote during the coronavirus pandemic!   Download and print an absentee ballot request form at [email or web address] or you can scan the QR code below with your smart phone:      More information on absentee ballots can be found here: [email or web address]

## 2018-07-29 NOTE — Progress Notes (Signed)
Start 1050 location home End: 1115

## 2018-08-03 ENCOUNTER — Ambulatory Visit (INDEPENDENT_AMBULATORY_CARE_PROVIDER_SITE_OTHER): Payer: Medicare PPO

## 2018-08-03 DIAGNOSIS — J309 Allergic rhinitis, unspecified: Secondary | ICD-10-CM | POA: Diagnosis not present

## 2018-08-04 ENCOUNTER — Telehealth: Payer: Self-pay | Admitting: Emergency Medicine
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
## 2018-08-04 NOTE — Telephone Encounter (Signed)
FYI

## 2018-08-11 ENCOUNTER — Ambulatory Visit (INDEPENDENT_AMBULATORY_CARE_PROVIDER_SITE_OTHER): Payer: Medicare PPO | Admitting: *Deleted

## 2018-08-11 ENCOUNTER — Other Ambulatory Visit: Payer: Self-pay

## 2018-08-11 ENCOUNTER — Encounter: Payer: Self-pay | Admitting: Internal Medicine

## 2018-08-11 ENCOUNTER — Ambulatory Visit (INDEPENDENT_AMBULATORY_CARE_PROVIDER_SITE_OTHER): Payer: Medicare PPO | Admitting: Internal Medicine

## 2018-08-11 DIAGNOSIS — M545 Low back pain, unspecified: Secondary | ICD-10-CM

## 2018-08-11 DIAGNOSIS — J309 Allergic rhinitis, unspecified: Secondary | ICD-10-CM | POA: Diagnosis not present

## 2018-08-11 DIAGNOSIS — G8929 Other chronic pain: Secondary | ICD-10-CM | POA: Diagnosis not present

## 2018-08-11 DIAGNOSIS — I10 Essential (primary) hypertension: Secondary | ICD-10-CM

## 2018-08-11 DIAGNOSIS — M25511 Pain in right shoulder: Secondary | ICD-10-CM | POA: Diagnosis not present

## 2018-08-11 MED ORDER — MONTELUKAST SODIUM 10 MG PO TABS: ORAL_TABLET | ORAL | 3 refills | Status: DC
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
## 2018-08-11 NOTE — Assessment & Plan Note (Signed)
[facility] A little better PT offered Norco prn  Potential benefits of a long term opioids use as well as potential risks (i.e. addiction risk, apnea etc) and complications (i.e. Somnolence, constipation and others) were explained to the patient and were aknowledged.

## 2018-08-11 NOTE — Assessment & Plan Note (Signed)
Chronic  Diovan HCT, Furosemide prn, KCl
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
## 2018-08-11 NOTE — Patient Instructions (Signed)
If you have medicare related insurance (such as traditional Medicare, Blue Cross Medicare, United HealthCare Medicare, or similar), Please make an appointment at the scheduling desk with Jill, the Wellness Health Coach, for your Wellness visit in this office, which is a benefit with your insurance.  

## 2018-08-11 NOTE — Assessment & Plan Note (Signed)
PT offered 

## 2018-08-17 ENCOUNTER — Ambulatory Visit (INDEPENDENT_AMBULATORY_CARE_PROVIDER_SITE_OTHER): Payer: Medicare PPO | Admitting: *Deleted

## 2018-08-17 DIAGNOSIS — J309 Allergic rhinitis, unspecified: Secondary | ICD-10-CM | POA: Diagnosis not present

## 2018-08-24 ENCOUNTER — Ambulatory Visit (INDEPENDENT_AMBULATORY_CARE_PROVIDER_SITE_OTHER): Payer: Medicare PPO

## 2018-08-24 DIAGNOSIS — J309 Allergic rhinitis, unspecified: Secondary | ICD-10-CM

## 2018-09-01 ENCOUNTER — Encounter: Payer: Self-pay | Admitting: Internal Medicine

## 2018-09-01 ENCOUNTER — Other Ambulatory Visit: Payer: Self-pay

## 2018-09-01 ENCOUNTER — Ambulatory Visit (INDEPENDENT_AMBULATORY_CARE_PROVIDER_SITE_OTHER): Payer: Medicare PPO | Admitting: Internal Medicine

## 2018-09-01 DIAGNOSIS — G4733 Obstructive sleep apnea (adult) (pediatric): Secondary | ICD-10-CM | POA: Diagnosis not present

## 2018-09-01 NOTE — Progress Notes (Signed)
Subjective:    Patient ID: [name], female    DOB: 10-30-1950, 68 y.o.   MRN: [id number]  HPI F never smoker followed for asthma, allergic rhinitis,, OSA, obesity/hypoventilation, complicated by GERD, arthritis, HBP, Colon Ca/ L hemicolectomy NPSG 12/12/05-AHI 38.4/hour, desaturation to 66%, body weight 250 pounds  ---------------------------------------------------------------------------   09/02/2017- 68 year old female never smoker followed for asthma, allergic rhinitis, OSA, obesity/hypoventilation, complicated by GERD, arthritis, HBP, colon cancer/left hemicolectomy CPAP 9/Advanced Allergy vaccine- Dr Neldon Mc   Office Spirometry-stable mild restriction -----OSA and Asthma: DME: [facility] Pt continues to wear CPAP nighlty and DL attached. Pt feels that Tessalon Rx helps with cough and breathing.  Atrovent 0.06% nasal, Zyrtec, Singulair, Symbicort 160, albuterol HFA, Flonase/ Dymsita, , neb albuterol, Download 100% compliance AHI 0.5/hour.  She is comfortable with her CPAP and does not recognize any problems.  Thinks this machine is about 69 years old. Breathing has been comfortable but she likes to put a mask on when going outdoors especially in hot weather.  Has regular follow-up with her allergist.  Occasional use for rescue inhaler with no significant exacerbation. CXR 03/21/2017 IMPRESSION: Stable mild cardiomegaly.  No acute pulmonary process identified.  09/01/2018- Virtual Visit via Telephone Note  I connected with [name] on 09/01/18 at 11:00 AM EDT by telephone and verified that I am speaking with the correct person using two identifiers.  Location: Patient: H Provider: O   I discussed the limitations, risks, security and privacy concerns of performing an evaluation and management service by telephone and the availability of in person appointments. I also discussed with the patient that there may be a patient responsible charge related to this service. The patient  expressed understanding and agreed to proceed.   History of Present Illness: 68 year old female never smoker followed for asthma, allergic rhinitis, OSA, obesity/hypoventilation, complicated by GERD, arthritis, HBP, colon cancer/left hemicolectomy   Allergy vaccine- Dr Ernst Bowler CPAP 9/Advanced Covid careful- discussed. No download for televisit, but she reports using CPAP every night. Some mouth breathing and willing to try chin strap.  Uses gabapentin some to help cough and it also helps sleep.   Observations/Objective:   Assessment and Plan: OSA- Benefits from CPAP. Plan- add chin strap Asthma- working with Allergy, on vaccine. Notes gabapentin some help for cough.  Follow Up Instructions:    I discussed the assessment and treatment plan with the patient. The patient was provided an opportunity to ask questions and all were answered. The patient agreed with the plan and demonstrated an understanding of the instructions.   The patient was advised to call back or seek an in-person evaluation if the symptoms worsen or if the condition fails to improve as anticipated.  I provided 10 minutes of non-face-to-face time during this encounter.   Baird Lyons, MD     ROS-see HPI   + = positive Constitutional:   No-   weight loss, night sweats, fevers, chills, fatigue, lassitude. HEENT:   No-  headaches, difficulty swallowing, tooth/dental problems, sore throat,       No-  sneezing, itching, ear ache, nasal congestion, + post nasal drip,  CV:  No-   chest pain, orthopnea, PND, swelling in lower extremities, anasarca, dizziness, palpitations Resp: No- acute  shortness of breath with exertion or at rest.              No-   productive cough,  + non-productive cough,  No- coughing up of blood.  No-   change in color of mucus.  No-sustained wheezing.   Skin: No-   rash or lesions. GI:  + heartburn, indigestion, no-abdominal pain, nausea, vomiting,  GU: No  complaint MS:  No-    joint pain or swelling.   Neuro-     nothing unusual Psych:  No- change in mood or affect. No depression or anxiety.  No memory loss.  OBJ- Physical Exam   similar to previous exams General- Alert, Oriented, Affect-appropriate, Distress- none acute, +obese Skin- rash-none, lesions- none, excoriation- none Lymphadenopathy- none Head- atraumatic            Eyes- Gross vision intact, PERRLA, conjunctivae and secretions clear            Ears- Hearing, canals-normal            Nose- Clear, no-Septal dev, mucus, polyps, erosion, perforation             Throat- Mallampati III , mucosa clear,not dry , drainage- none, tonsils- atrophic, + hoarse Neck- flexible , trachea midline, no stridor , thyroid nl, carotid no bruit Chest - symmetrical excursion , unlabored           Heart/CV- RRR , no murmur , no gallop  , no rub, nl s1 s2                           - JVD- none , edema- none, stasis changes- none, varices- none           Lung- clear to P&A, wheeze- none, cough-none , dullness-none, rub- none           Chest wall-  Abd-  Br/ Gen/ Rectal- Not done, not indicated Extrem- cyanosis- none, clubbing, none, atrophy- none, strength- nl Neuro- grossly intact to observation

## 2018-09-01 NOTE — Patient Instructions (Signed)
Order- DME [employer]- Please replace worn out chin strap. Continue CPAP 9, mask of choice, humidifier, supplies, AirView/ card  Please call if we can help

## 2018-09-08 ENCOUNTER — Telehealth: Payer: Self-pay | Admitting: Oncology

## 2018-09-08 ENCOUNTER — Inpatient Hospital Stay (HOSPITAL_BASED_OUTPATIENT_CLINIC_OR_DEPARTMENT_OTHER): Payer: Medicare PPO | Admitting: Oncology

## 2018-09-08 ENCOUNTER — Other Ambulatory Visit: Payer: Self-pay

## 2018-09-08 ENCOUNTER — Ambulatory Visit (INDEPENDENT_AMBULATORY_CARE_PROVIDER_SITE_OTHER): Payer: Medicare PPO | Admitting: *Deleted

## 2018-09-08 ENCOUNTER — Inpatient Hospital Stay: Payer: Medicare PPO | Attending: Oncology

## 2018-09-08 VITALS — BP 131/73 | HR 59 | Temp 97.8°F | Resp 18 | Ht 62.0 in | Wt 223.3 lb

## 2018-09-08 DIAGNOSIS — D509 Iron deficiency anemia, unspecified: Secondary | ICD-10-CM | POA: Insufficient documentation

## 2018-09-08 DIAGNOSIS — Z85038 Personal history of other malignant neoplasm of large intestine: Secondary | ICD-10-CM | POA: Insufficient documentation

## 2018-09-08 DIAGNOSIS — J309 Allergic rhinitis, unspecified: Secondary | ICD-10-CM | POA: Diagnosis not present

## 2018-09-08 DIAGNOSIS — C184 Malignant neoplasm of transverse colon: Secondary | ICD-10-CM | POA: Diagnosis not present

## 2018-09-08 DIAGNOSIS — R918 Other nonspecific abnormal finding of lung field: Secondary | ICD-10-CM | POA: Insufficient documentation

## 2018-09-08 DIAGNOSIS — J45909 Unspecified asthma, uncomplicated: Secondary | ICD-10-CM | POA: Diagnosis not present

## 2018-09-08 DIAGNOSIS — Z9221 Personal history of antineoplastic chemotherapy: Secondary | ICD-10-CM | POA: Insufficient documentation

## 2018-09-08 LAB — CEA (IN HOUSE-CHCC): CEA (CHCC-In House): 1.55 ng/mL (ref 0.00–5.00)

## 2018-09-08 NOTE — Progress Notes (Signed)
[place] OFFICE PROGRESS NOTE   Diagnosis: Colon cancer  INTERVAL HISTORY:   Ms. Fehrenbach returns as scheduled.  She feels well.  Good appetite.  No complaint.  Objective:  Vital signs in last 24 hours:  Blood pressure 131/73, pulse (!) 59, temperature 97.8 F (36.6 C), temperature source Oral, resp. rate 18, height 5' 2" (1.575 m), weight 223 lb 4.8 oz (101.3 kg), SpO2 98 %.    HEENT: Neck without mass Lymphatics: No cervical, supraclavicular, axillary, or inguinal nodes GI: No hepatosplenomegaly, no mass, nontender Vascular: No leg edema     Lab Results:  Lab Results  Component Value Date   WBC 5.3 09/09/2017   HGB 11.6 09/09/2017   HCT 35.9 09/09/2017   MCV 73.8 (L) 09/09/2017   PLT 268 09/09/2017   NEUTROABS 3.1 09/09/2017    CMP  Lab Results  Component Value Date   NA 142 03/09/2018   K 3.5 03/09/2018   CL 104 03/09/2018   CO2 28 03/09/2018   GLUCOSE 100 (H) 03/09/2018   BUN 14 03/09/2018   CREATININE 0.89 03/09/2018   CALCIUM 9.5 03/09/2018   PROT 7.1 11/19/2016   ALBUMIN 3.9 11/19/2016   AST 13 11/19/2016   ALT 14 11/19/2016   ALKPHOS 133 (H) 11/19/2016   BILITOT 0.5 11/19/2016   GFRNONAA >60 03/09/2018   GFRAA >60 03/09/2018    Lab Results  Component Value Date   CEA1 1.71 03/09/2018    Medications: I have reviewed the patient's current medications.   Assessment/Plan: 1. 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   Presentation to the emergency room 02/02/2013 with a colonic obstruction secondary to tumor at the splenic flexure.   Baseline CEA on 02/02/2013 less than 0.5.   Initiation of adjuvant Xeloda 04/10/2013.   Cycle 2 adjuvant Xeloda  05/01/2013.   Cycle 3 adjuvant Xeloda 05/22/2013.   Cycle 4 adjuvant Xeloda 06/12/2013.   Cycle 5 adjuvant Xeloda 07/03/2013 (Xeloda dose reduced due to hand foot syndrome).   Cycle 6 adjuvant Xeloda 07/24/2013.   Cycle 7 adjuvant Xeloda 08/14/2013   Cycle 8 adjuvant Xeloda 09/07/2013.  Surveillance colonoscopy 11/26/2016-patentend toside colocolonic anastomosis, characterized by healthy-appearing mucosa. Examination otherwise normal. Repeat colonoscopy in 5 years for surveillance. 2. History of iron deficiency anemia. Recurrent anemia 02/01/2014, improved, Persistent red cell microcytosis 3. Asthma. 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 5. Status post ostomy reversal 11/25/2013. 6. CT 02/01/2014 with no evidence of local tumor recurrence or metastatic disease. New area of masslike thickening and small bowel dilatation at the mid small bowel  Status post deep enteroscopy at [facility] on 03/16/2014, negative.  Status post capsule endoscopy 03/22/2014, confirmed a small bowel tumor  Exploratory laparotomy with resection of a small bowel mass on 04/06/2014 with the pathology confirming invasive adenocarcinoma extending through small bowel wall and involving adjacent loops of adherent small bowel, 2 of 12 lymph nodes positive. History consistent with recurrent colon cancer.Loss of MLH1 and PMS 2, MSI high as was the January 2015 tumor  CT abdomen/pelvis 02/03/2015-no evidence of recurrent colon cancer, ventral hernias  CT chest/abdomen/pelvis 03/09/2018-no evidence of recurrent or metastatic disease.  Nonspecific 2 mm posterior right upper lobe nodule.  Continued attention on follow-up exams suggested.     Disposition: Ms. Rawdon is in  clinical remission from colon cancer.  We will follow-up on the CEA from today.  She will return for an office visit in 6 months.  We will discuss the indication for a follow-up chest CT when she returns in 6 months.  She is a never smoker.   The nonspecific 2 mm right lung nodule noted on a CT 03/09/2018 is unlikely to represent a primary lung cancer or recurrence of colon cancer.  Betsy Coder, MD  09/08/2018  1:25 PM

## 2018-09-08 NOTE — Telephone Encounter (Signed)
Scheduled appt per 8/11 los - gave patient AVS and calender per los.   

## 2018-09-09 ENCOUNTER — Telehealth: Payer: Self-pay

## 2018-09-09 NOTE — Telephone Encounter (Signed)
TC to Pt per [name] informed of normal CEA results (1.55). Pt. Verbalized understanding.

## 2018-09-09 NOTE — Telephone Encounter (Signed)
-----   Message from Ladell Pier, MD sent at 09/08/2018  5:26 PM EDT ----- Please call patient, cea is normal

## 2018-09-21 ENCOUNTER — Ambulatory Visit (INDEPENDENT_AMBULATORY_CARE_PROVIDER_SITE_OTHER): Payer: Medicare PPO

## 2018-09-21 DIAGNOSIS — J309 Allergic rhinitis, unspecified: Secondary | ICD-10-CM

## 2018-10-06 ENCOUNTER — Ambulatory Visit (INDEPENDENT_AMBULATORY_CARE_PROVIDER_SITE_OTHER): Payer: Medicare PPO

## 2018-10-06 DIAGNOSIS — J309 Allergic rhinitis, unspecified: Secondary | ICD-10-CM

## 2018-10-08 ENCOUNTER — Other Ambulatory Visit: Payer: Self-pay | Admitting: *Deleted

## 2018-10-13 DIAGNOSIS — J301 Allergic rhinitis due to pollen: Secondary | ICD-10-CM | POA: Diagnosis not present

## 2018-10-13 NOTE — Progress Notes (Signed)
Vials exp 10-13-19

## 2018-10-14 DIAGNOSIS — J3089 Other allergic rhinitis: Secondary | ICD-10-CM | POA: Diagnosis not present

## 2018-10-19 ENCOUNTER — Ambulatory Visit (INDEPENDENT_AMBULATORY_CARE_PROVIDER_SITE_OTHER): Payer: Medicare PPO

## 2018-10-19 DIAGNOSIS — J309 Allergic rhinitis, unspecified: Secondary | ICD-10-CM | POA: Diagnosis not present

## 2018-10-20 ENCOUNTER — Encounter: Payer: Self-pay | Admitting: Internal Medicine

## 2018-10-27 NOTE — Progress Notes (Signed)
[place], [address] [place] [address] Dept: [phone]  FOLLOW UP NOTE  Patient ID: [name], female    DOB: May 24, 1950  Age: 68 y.o. MRN: [id number] Date of Office Visit: 10/28/2018  Assessment  Chief Complaint: Asthma  HPI [name] is a 68 year old female who presents to the clinic for a follow up visit. She was last seen via televisit on 07/29/2018 by Dr. Ernst Bowler for evaluation of asthma, allergic rhinitis, cough, and reflux. At today's visit, she reports her asthma has been well controlled with no shortness of breath, cough, or wheeze. She continues Symbicort 160-2 puffs twice a day with a spacer, montelukast 10 mg once a day and uses her albuterol less than once a week. Allergic rhinitis is reported as well controlled with a combination of Flonase and Atrovent nasal sapry as well as levocetirizine once a day. Reflux is reported as well controlled with no heartburn with omeprazole and famotidine. She reports her cough has resolved for the most part after beginning gabapentin as recommended by her ENT specialist, Dr. Benjamine Mola. Her current medications are listed in the chart.    Drug Allergies:  No Known Allergies  Physical Exam: BP 122/80 (BP Location: Left Arm, Patient Position: Sitting, Cuff Size: Large)   Pulse 66   Temp 98.1 F (36.7 C) (Temporal)   Resp 16   Ht 5\' 2"  (1.575 m)   Wt 220 lb 12.8 oz (100.2 kg)   SpO2 96%   BMI 40.38 kg/m    Physical Exam Vitals signs reviewed.  Constitutional:      Appearance: Normal appearance.  HENT:     Head: Normocephalic and atraumatic.     Right Ear: Tympanic membrane normal.     Left Ear: Tympanic membrane normal.     Nose:     Comments: Bilateral nares slightly erythematous with no nasal drainage noted. Pharynx normal. Ears normal. Eyes normal.    Mouth/Throat:     Pharynx: Oropharynx is clear.  Eyes:     Conjunctiva/sclera: Conjunctivae normal.  Neck:     Musculoskeletal: Normal range of  motion and neck supple.  Cardiovascular:     Rate and Rhythm: Normal rate and regular rhythm.     Heart sounds: Normal heart sounds. No murmur.  Pulmonary:     Effort: Pulmonary effort is normal.     Breath sounds: Normal breath sounds.     Comments: Lungs clear to auscultation Musculoskeletal: Normal range of motion.  Skin:    General: Skin is warm and dry.  Neurological:     Mental Status: She is alert and oriented to person, place, and time.  Psychiatric:        Mood and Affect: Mood normal.        Behavior: Behavior normal.        Thought Content: Thought content normal.        Judgment: Judgment normal.     Diagnostics: FVC 1.60, FEV1 1.28. Predicted FVC 2.21, predicted FEV1 1.56. Spirometry indicates mild restriction. This is consistent with previous spirometry readings.    Assessment and Plan: 1. Moderate persistent asthma, uncomplicated   2. Seasonal and perennial allergic rhinitis   3. Cough   4. Gastroesophageal reflux disease, esophagitis presence not specified     Meds ordered this encounter  Medications  . ipratropium (ATROVENT) 0.06 % nasal spray    Sig: Place 1 spray into both nostrils 4 (four) times daily.    Dispense:  15 mL  Refill:  5  . fluticasone (FLONASE) 50 MCG/ACT nasal spray    Sig: Place 2 sprays into both nostrils 2 (two) times daily.    Dispense:  16 g    Refill:  3    Patient Instructions  1. Seasonal and perennial allergic rhinitis (trees, weeds, grasses, molds, dust mites, cat, dog and cockroach) - Continue allergy shots at the same schedule. - Continue with Flonase 2 sprays in each nostril once or twice a day and azelastine 2 sprays in each nostril twice a day - Continue with ipratripium one spray per nostril every 6 hours as needed and Xyzal (levocetirizine) 5mg  tablet once daily - You can use an extra dose of the antihistamine, if needed, for breakthrough symptoms.  - Consider nasal saline rinses 1-2 times daily to remove allergens  from the nasal cavities as well as help with mucous clearance (this is especially helpful to do before the nasal sprays are given). - Continue saline nasal rinses - Continue with Tessalon Perles 100 mg every 8 hours as needed for the cough  2. Moderate persistent asthma, uncomplicated - Lung testing looked normal today - We will continue you on Symbicort 160-2 puffs twice a day with a spacer.  - Daily controller medication(s): Symbicort 160/4.36mcg two puffs twice daily with spacer + montelukast 10 mg once a day - Prior to physical activity: albuterol 2 puffs 10-15 minutes before physical activity. - Rescue medications: albuterol 4 puffs every 4-6 hours as needed - Asthma control goals:  * Full participation in all desired activities (may need albuterol before activity) * Albuterol use two time or less a week on average (not counting use with activity) * Cough interfering with sleep two time or less a month * Oral steroids no more than once a year * No hospitalization  3. GERD - Cut out caffeine, chocolate, and peppermint intake - Continue with omeprazole in the morning. - Continue famotidine 40 mg at night  4. Cough - Stable on gabapentin. Continue the treatment plan outlined by Dr. Benjamine Mola  5. Follow up in 6 months or sooner if needed This can be an in-person, a virtual Webex or a telephone follow up visit.   Return in about 6 months (around 04/27/2019), or if symptoms worsen or fail to improve.    Thank you for the opportunity to care for this patient.  Please do not hesitate to contact me with questions.  Gareth Morgan, FNP [facility]

## 2018-10-27 NOTE — Patient Instructions (Addendum)
1. Seasonal and perennial allergic rhinitis (trees, weeds, grasses, molds, dust mites, cat, dog and cockroach) - Continue allergy shots at the same schedule. - Continue with Flonase 2 sprays in each nostril once or twice a day and azelastine 2 sprays in each nostril twice a day - Continue with ipratripium one spray per nostril every 6 hours as needed and Xyzal (levocetirizine) 5mg  tablet once daily - You can use an extra dose of the antihistamine, if needed, for breakthrough symptoms.  - Consider nasal saline rinses 1-2 times daily to remove allergens from the nasal cavities as well as help with mucous clearance (this is especially helpful to do before the nasal sprays are given). - Continue saline nasal rinses - Continue with Tessalon Perles 100 mg every 8 hours as needed for the cough  2. Moderate persistent asthma, uncomplicated - Lung testing looked normal today - We will continue you on Symbicort 160-2 puffs twice a day with a spacer.  - Daily controller medication(s): Symbicort 160/4.35mcg two puffs twice daily with spacer + montelukast 10 mg once a day - Prior to physical activity: albuterol 2 puffs 10-15 minutes before physical activity. - Rescue medications: albuterol 4 puffs every 4-6 hours as needed - Asthma control goals:  * Full participation in all desired activities (may need albuterol before activity) * Albuterol use two time or less a week on average (not counting use with activity) * Cough interfering with sleep two time or less a month * Oral steroids no more than once a year * No hospitalization  3. GERD - Cut out caffeine, chocolate, and peppermint intake - Continue with omeprazole in the morning. - Continue famotidine 40 mg at night  4. Cough - Stable on gabapentin. Continue the treatment plan outlined by Dr. Benjamine Mola  5. Follow up in 6 months or sooner if needed This can be an in-person, a virtual Webex or a telephone follow up visit.   Please inform us of any  Emergency Department visits, hospitalizations, or changes in symptoms. Call us before going to the ED for breathing or allergy symptoms since we might be able to fit you in for a sick visit. Feel free to contact us anytime with any questions, problems, or concerns.  It was a pleasure to talk to you today today!

## 2018-10-28 ENCOUNTER — Encounter: Payer: Self-pay | Admitting: Family Medicine

## 2018-10-28 ENCOUNTER — Other Ambulatory Visit: Payer: Self-pay

## 2018-10-28 ENCOUNTER — Ambulatory Visit (INDEPENDENT_AMBULATORY_CARE_PROVIDER_SITE_OTHER): Payer: Medicare PPO | Admitting: Family Medicine

## 2018-10-28 VITALS — BP 122/80 | HR 66 | Temp 98.1°F | Resp 16 | Ht 62.0 in | Wt 220.8 lb

## 2018-10-28 DIAGNOSIS — J302 Other seasonal allergic rhinitis: Secondary | ICD-10-CM

## 2018-10-28 DIAGNOSIS — K219 Gastro-esophageal reflux disease without esophagitis: Secondary | ICD-10-CM | POA: Diagnosis not present

## 2018-10-28 DIAGNOSIS — R05 Cough: Secondary | ICD-10-CM | POA: Diagnosis not present

## 2018-10-28 DIAGNOSIS — R059 Cough, unspecified: Secondary | ICD-10-CM

## 2018-10-28 DIAGNOSIS — J4540 Moderate persistent asthma, uncomplicated: Secondary | ICD-10-CM

## 2018-10-28 DIAGNOSIS — J3089 Other allergic rhinitis: Secondary | ICD-10-CM | POA: Diagnosis not present

## 2018-10-28 MED ORDER — IPRATROPIUM BROMIDE 0.06 % NA SOLN: 1.0000 | Freq: Four times a day (QID) | NASAL | 5 refills | Status: DC

## 2018-10-28 MED ORDER — FLUTICASONE PROPIONATE 50 MCG/ACT NA SUSP: 2.0000 | Freq: Two times a day (BID) | NASAL | 3 refills | Status: DC

## 2018-11-02 ENCOUNTER — Ambulatory Visit (INDEPENDENT_AMBULATORY_CARE_PROVIDER_SITE_OTHER): Payer: Medicare PPO | Admitting: *Deleted

## 2018-11-02 DIAGNOSIS — J309 Allergic rhinitis, unspecified: Secondary | ICD-10-CM

## 2018-11-10 ENCOUNTER — Ambulatory Visit (INDEPENDENT_AMBULATORY_CARE_PROVIDER_SITE_OTHER): Payer: Medicare PPO | Admitting: *Deleted

## 2018-11-10 ENCOUNTER — Ambulatory Visit (INDEPENDENT_AMBULATORY_CARE_PROVIDER_SITE_OTHER): Payer: Medicare PPO | Admitting: Internal Medicine

## 2018-11-10 ENCOUNTER — Other Ambulatory Visit: Payer: Self-pay

## 2018-11-10 ENCOUNTER — Encounter: Payer: Self-pay | Admitting: Internal Medicine

## 2018-11-10 ENCOUNTER — Other Ambulatory Visit (INDEPENDENT_AMBULATORY_CARE_PROVIDER_SITE_OTHER): Payer: Medicare PPO

## 2018-11-10 VITALS — BP 118/72 | HR 73 | Temp 98.4°F | Ht 62.0 in | Wt 221.0 lb

## 2018-11-10 VITALS — BP 118/72 | HR 73 | Ht 62.0 in | Wt 221.0 lb

## 2018-11-10 DIAGNOSIS — K219 Gastro-esophageal reflux disease without esophagitis: Secondary | ICD-10-CM | POA: Diagnosis not present

## 2018-11-10 DIAGNOSIS — E559 Vitamin D deficiency, unspecified: Secondary | ICD-10-CM | POA: Diagnosis not present

## 2018-11-10 DIAGNOSIS — Z0000 Encounter for general adult medical examination without abnormal findings: Secondary | ICD-10-CM | POA: Diagnosis not present

## 2018-11-10 DIAGNOSIS — Z780 Asymptomatic menopausal state: Secondary | ICD-10-CM | POA: Diagnosis not present

## 2018-11-10 DIAGNOSIS — S161XXD Strain of muscle, fascia and tendon at neck level, subsequent encounter: Secondary | ICD-10-CM

## 2018-11-10 DIAGNOSIS — D510 Vitamin B12 deficiency anemia due to intrinsic factor deficiency: Secondary | ICD-10-CM

## 2018-11-10 DIAGNOSIS — S161XXS Strain of muscle, fascia and tendon at neck level, sequela: Secondary | ICD-10-CM

## 2018-11-10 DIAGNOSIS — Z1231 Encounter for screening mammogram for malignant neoplasm of breast: Secondary | ICD-10-CM

## 2018-11-10 DIAGNOSIS — S161XXA Strain of muscle, fascia and tendon at neck level, initial encounter: Secondary | ICD-10-CM

## 2018-11-10 DIAGNOSIS — Z85038 Personal history of other malignant neoplasm of large intestine: Secondary | ICD-10-CM

## 2018-11-10 DIAGNOSIS — Z23 Encounter for immunization: Secondary | ICD-10-CM

## 2018-11-10 LAB — URINALYSIS, ROUTINE W REFLEX MICROSCOPIC
Bilirubin Urine: NEGATIVE
Hgb urine dipstick: NEGATIVE
Ketones, ur: NEGATIVE
Nitrite: NEGATIVE
RBC / HPF: NONE SEEN (ref 0–?)
Specific Gravity, Urine: 1.015 (ref 1.000–1.030)
Total Protein, Urine: NEGATIVE
Urine Glucose: NEGATIVE
Urobilinogen, UA: 1 (ref 0.0–1.0)
pH: 6 (ref 5.0–8.0)

## 2018-11-10 LAB — LIPID PANEL
Cholesterol: 170 mg/dL (ref 0–200)
HDL: 79 mg/dL (ref >39.00)
LDL Cholesterol: 78 mg/dL (ref 0–99)
NonHDL: 91.25
Total CHOL/HDL Ratio: 2
Triglycerides: 65 mg/dL (ref 0.0–149.0)
VLDL: 13 mg/dL (ref 0.0–40.0)

## 2018-11-10 LAB — CBC WITH DIFFERENTIAL/PLATELET
Basophils Absolute: 0.1 10*3/uL (ref 0.0–0.1)
Basophils Relative: 1.5 % (ref 0.0–3.0)
Eosinophils Absolute: 0.1 10*3/uL (ref 0.0–0.7)
Eosinophils Relative: 1.6 % (ref 0.0–5.0)
HCT: 36.7 % (ref 36.0–46.0)
Hemoglobin: 11.8 g/dL — ABNORMAL LOW (ref 12.0–15.0)
Lymphocytes Relative: 27.1 % (ref 12.0–46.0)
Lymphs Abs: 1.4 10*3/uL (ref 0.7–4.0)
MCHC: 32.3 g/dL (ref 30.0–36.0)
MCV: 73.6 fl — ABNORMAL LOW (ref 78.0–100.0)
Monocytes Absolute: 0.5 10*3/uL (ref 0.1–1.0)
Monocytes Relative: 9.9 % (ref 3.0–12.0)
Neutro Abs: 3.1 10*3/uL (ref 1.4–7.7)
Neutrophils Relative %: 59.9 % (ref 43.0–77.0)
Platelets: 269 10*3/uL (ref 150.0–400.0)
RBC: 4.98 Mil/uL (ref 3.87–5.11)
RDW: 17 % — ABNORMAL HIGH (ref 11.5–15.5)
WBC: 5.2 10*3/uL (ref 4.0–10.5)

## 2018-11-10 LAB — HEPATIC FUNCTION PANEL
ALT: 13 U/L (ref 0–35)
AST: 13 U/L (ref 0–37)
Albumin: 4 g/dL (ref 3.5–5.2)
Alkaline Phosphatase: 156 U/L — ABNORMAL HIGH (ref 39–117)
Bilirubin, Direct: 0.1 mg/dL (ref 0.0–0.3)
Total Bilirubin: 0.5 mg/dL (ref 0.2–1.2)
Total Protein: 7.3 g/dL (ref 6.0–8.3)

## 2018-11-10 LAB — BASIC METABOLIC PANEL
BUN: 14 mg/dL (ref 6–23)
CO2: 29 mEq/L (ref 19–32)
Calcium: 9.8 mg/dL (ref 8.4–10.5)
Chloride: 104 mEq/L (ref 96–112)
Creatinine, Ser: 0.83 mg/dL (ref 0.40–1.20)
GFR: 82.37 mL/min (ref >60.00)
Glucose, Bld: 97 mg/dL (ref 70–99)
Potassium: 3.7 mEq/L (ref 3.5–5.1)
Sodium: 142 mEq/L (ref 135–145)

## 2018-11-10 LAB — TSH: TSH: 2.15 u[IU]/mL (ref 0.35–4.50)

## 2018-11-10 LAB — VITAMIN D 25 HYDROXY (VIT D DEFICIENCY, FRACTURES): VITD: 30.79 ng/mL (ref 30.00–100.00)

## 2018-11-10 LAB — VITAMIN B12: Vitamin B-12: 564 pg/mL (ref 211–911)

## 2018-11-10 NOTE — Assessment & Plan Note (Signed)
F/u w/Onc 

## 2018-11-10 NOTE — Assessment & Plan Note (Signed)
B12 po
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
## 2018-11-10 NOTE — Patient Instructions (Addendum)
The [facility] is available Monday through Friday, 9:00 a.m. - 5:00 p.m. Call Center Specialists provide information and referral services to aging adults 65+ in [place]. If there are waiting lists for community services, or if services are not available, [facility] connects clients with [facility] volunteers, or other caring individuals in the community who provide services such as respite care, wheelchair ramp construction, friendly visits, or transportation assistance.  [facility] Specialists consider it an honor and privilege to pray with callers. Contact the Call Center at [phone] or email [email or web address].  Continue to eat heart healthy diet (full of fruits, vegetables, whole grains, lean protein, water--limit salt, fat, and sugar intake) and increase physical activity as tolerated.  Continue to eat heart healthy diet (full of fruits, vegetables, whole grains, lean protein, water--limit salt, fat, and sugar intake) and increase physical activity as tolerated.   [name] , Thank you for taking time to come for your Medicare Wellness Visit. I appreciate your ongoing commitment to your health goals. Please review the following plan we discussed and let me know if I can assist you in the future.   These are the goals we discussed: Goals    . Patient Stated     Maintain current health status.       This is a list of the screening recommended for you and due dates:  Health Maintenance  Topic Date Due  .  Hepatitis C: One time screening is recommended by Center for Disease Control  (CDC) for  adults born from [birth year] through 1966.   Feb 28, 1950  . Mammogram  03/29/2014  . DEXA scan (bone density measurement)  06/09/2014  . Pneumonia vaccines (2 of 2 - PPSV23) 01/14/2017  . Flu Shot  08/29/2018  . Tetanus Vaccine  08/26/2021  . Colon Cancer Screening  11/26/2021    Preventive Care 65 Years and Older, Female Preventive care refers to lifestyle choices and visits with your  health care provider that can promote health and wellness. This includes:  A yearly physical exam. This is also called an annual well check.  Regular dental and eye exams.  Immunizations.  Screening for certain conditions.  Healthy lifestyle choices, such as diet and exercise. What can I expect for my preventive care visit? Physical exam Your health care provider will check:  Height and weight. These may be used to calculate body mass index (BMI), which is a measurement that tells if you are at a healthy weight.  Heart rate and blood pressure.  Your skin for abnormal spots. Counseling Your health care provider may ask you questions about:  Alcohol, tobacco, and drug use.  Emotional well-being.  Home and relationship well-being.  Sexual activity.  Eating habits.  History of falls.  Memory and ability to understand (cognition).  Work and work Statistician.  Pregnancy and menstrual history. What immunizations do I need?  Influenza (flu) vaccine  This is recommended every year. Tetanus, diphtheria, and pertussis (Tdap) vaccine  You may need a Td booster every 10 years. Varicella (chickenpox) vaccine  You may need this vaccine if you have not already been vaccinated. Zoster (shingles) vaccine  You may need this after age 43. Pneumococcal conjugate (PCV13) vaccine  One dose is recommended after age 69. Pneumococcal polysaccharide (PPSV23) vaccine  One dose is recommended after age 69. Measles, mumps, and rubella (MMR) vaccine  You may need at least one dose of MMR if you were born in 1957 or later. You may also need a second dose.  Meningococcal conjugate (MenACWY) vaccine  You may need this if you have certain conditions. Hepatitis A vaccine  You may need this if you have certain conditions or if you travel or work in places where you may be exposed to hepatitis A. Hepatitis B vaccine  You may need this if you have certain conditions or if you travel or  work in places where you may be exposed to hepatitis B. Haemophilus influenzae type b (Hib) vaccine  You may need this if you have certain conditions. You may receive vaccines as individual doses or as more than one vaccine together in one shot (combination vaccines). Talk with your health care provider about the risks and benefits of combination vaccines. What tests do I need? Blood tests  Lipid and cholesterol levels. These may be checked every 5 years, or more frequently depending on your overall health.  Hepatitis C test.  Hepatitis B test. Screening  Lung cancer screening. You may have this screening every year starting at age 69 if you have a 30-pack-year history of smoking and currently smoke or have quit within the past 15 years.  Colorectal cancer screening. All adults should have this screening starting at age 69 and continuing until age 44. Your health care provider may recommend screening at age 64 if you are at increased risk. You will have tests every 1-10 years, depending on your results and the type of screening test.  Diabetes screening. This is done by checking your blood sugar (glucose) after you have not eaten for a while (fasting). You may have this done every 1-3 years.  Mammogram. This may be done every 1-2 years. Talk with your health care provider about how often you should have regular mammograms.  BRCA-related cancer screening. This may be done if you have a family history of breast, ovarian, tubal, or peritoneal cancers. Other tests  Sexually transmitted disease (STD) testing.  Bone density scan. This is done to screen for osteoporosis. You may have this done starting at age 69. Follow these instructions at home: Eating and drinking  Eat a diet that includes fresh fruits and vegetables, whole grains, lean protein, and low-fat dairy products. Limit your intake of foods with high amounts of sugar, saturated fats, and salt.  Take vitamin and mineral  supplements as recommended by your health care provider.  Do not drink alcohol if your health care provider tells you not to drink.  If you drink alcohol: ? Limit how much you have to 0-1 drink a day. ? Be aware of how much alcohol is in your drink. In the [place] one drink equals one 12 oz bottle of beer (355 mL), one 5 oz glass of wine (148 mL), or one 1 oz glass of hard liquor (44 mL). Lifestyle  Take daily care of your teeth and gums.  Stay active. Exercise for at least 30 minutes on 5 or more days each week.  Do not use any products that contain nicotine or tobacco, such as cigarettes, e-cigarettes, and chewing tobacco. If you need help quitting, ask your health care provider.  If you are sexually active, practice safe sex. Use a condom or other form of protection in order to prevent STIs (sexually transmitted infections).  Talk with your health care provider about taking a low-dose aspirin or statin. What's next?  Go to your health care provider once a year for a well check visit.  Ask your health care provider how often you should have your eyes and teeth checked.  Stay up to date on all vaccines. This information is not intended to replace advice given to you by your health care provider. Make sure you discuss any questions you have with your health care provider. Document Released: 02/10/2015 Document Revised: 01/08/2018 Document Reviewed: 01/08/2018 Elsevier Patient Education  2020 [employer].

## 2018-11-10 NOTE — Assessment & Plan Note (Signed)
Wt Readings from Last 3 Encounters:  11/10/18 221 lb (100.2 kg)  10/28/18 220 lb 12.8 oz (100.2 kg)  09/08/18 223 lb 4.8 oz (101.3 kg)

## 2018-11-10 NOTE — Assessment & Plan Note (Signed)
We discussed age appropriate health related issues, including available/recomended screening tests and vaccinations. We discussed a need for adhering to healthy diet and exercise. Labs were ordered to be later reviewed . All questions were answered.   

## 2018-11-10 NOTE — Addendum Note (Signed)
Addended by: Karren Cobble on: 11/10/2018 01:24 PM   Modules accepted: Orders

## 2018-11-11 LAB — IRON,TIBC AND FERRITIN PANEL
%SAT: 19 % (calc) (ref 16–45)
Ferritin: 65 ng/mL (ref 16–288)
Iron: 51 ug/dL (ref 45–160)
TIBC: 263 mcg/dL (calc) (ref 250–450)

## 2018-11-16 ENCOUNTER — Ambulatory Visit (INDEPENDENT_AMBULATORY_CARE_PROVIDER_SITE_OTHER): Payer: Medicare PPO

## 2018-11-16 DIAGNOSIS — J309 Allergic rhinitis, unspecified: Secondary | ICD-10-CM | POA: Diagnosis not present

## 2018-11-23 ENCOUNTER — Ambulatory Visit (INDEPENDENT_AMBULATORY_CARE_PROVIDER_SITE_OTHER): Payer: Medicare PPO | Admitting: Otolaryngology

## 2018-11-23 ENCOUNTER — Other Ambulatory Visit: Payer: Self-pay

## 2018-11-23 DIAGNOSIS — R05 Cough: Secondary | ICD-10-CM

## 2018-12-03 ENCOUNTER — Ambulatory Visit (INDEPENDENT_AMBULATORY_CARE_PROVIDER_SITE_OTHER): Payer: Medicare PPO

## 2018-12-03 DIAGNOSIS — J309 Allergic rhinitis, unspecified: Secondary | ICD-10-CM

## 2018-12-08 ENCOUNTER — Ambulatory Visit (INDEPENDENT_AMBULATORY_CARE_PROVIDER_SITE_OTHER): Payer: Medicare PPO | Admitting: *Deleted

## 2018-12-08 DIAGNOSIS — J309 Allergic rhinitis, unspecified: Secondary | ICD-10-CM | POA: Diagnosis not present

## 2018-12-14 ENCOUNTER — Ambulatory Visit (INDEPENDENT_AMBULATORY_CARE_PROVIDER_SITE_OTHER): Payer: Medicare PPO

## 2018-12-14 DIAGNOSIS — J309 Allergic rhinitis, unspecified: Secondary | ICD-10-CM

## 2019-01-07 ENCOUNTER — Ambulatory Visit (INDEPENDENT_AMBULATORY_CARE_PROVIDER_SITE_OTHER): Payer: Medicare PPO

## 2019-01-07 DIAGNOSIS — J309 Allergic rhinitis, unspecified: Secondary | ICD-10-CM

## 2019-01-11 ENCOUNTER — Ambulatory Visit (INDEPENDENT_AMBULATORY_CARE_PROVIDER_SITE_OTHER): Payer: Medicare PPO

## 2019-01-11 DIAGNOSIS — J309 Allergic rhinitis, unspecified: Secondary | ICD-10-CM | POA: Diagnosis not present

## 2019-01-18 ENCOUNTER — Ambulatory Visit (INDEPENDENT_AMBULATORY_CARE_PROVIDER_SITE_OTHER): Payer: Medicare PPO

## 2019-01-18 DIAGNOSIS — J309 Allergic rhinitis, unspecified: Secondary | ICD-10-CM

## 2019-01-25 ENCOUNTER — Ambulatory Visit (INDEPENDENT_AMBULATORY_CARE_PROVIDER_SITE_OTHER): Payer: Medicare PPO

## 2019-01-25 DIAGNOSIS — J309 Allergic rhinitis, unspecified: Secondary | ICD-10-CM | POA: Diagnosis not present

## 2019-02-08 ENCOUNTER — Ambulatory Visit (INDEPENDENT_AMBULATORY_CARE_PROVIDER_SITE_OTHER): Payer: Medicare PPO

## 2019-02-08 DIAGNOSIS — J309 Allergic rhinitis, unspecified: Secondary | ICD-10-CM

## 2019-02-15 ENCOUNTER — Other Ambulatory Visit: Payer: Self-pay | Admitting: Internal Medicine

## 2019-02-16 MED ORDER — ALBUTEROL SULFATE HFA 108 (90 BASE) MCG/ACT IN AERS: INHALATION_SPRAY | RESPIRATORY_TRACT | 5 refills | Status: DC

## 2019-02-16 NOTE — Addendum Note (Signed)
Addended by: Earnstine Regal on: 02/16/2019 10:10 AM   Modules accepted: Orders

## 2019-02-16 NOTE — Telephone Encounter (Signed)
1st request did not go through. Resent albuterol to pof.Marland KitchenJohny Brandt

## 2019-02-19 ENCOUNTER — Other Ambulatory Visit: Payer: Self-pay

## 2019-02-19 MED ORDER — LEVOCETIRIZINE DIHYDROCHLORIDE 5 MG PO TABS: 5.0000 mg | ORAL_TABLET | Freq: Every evening | ORAL | 1 refills | Status: DC

## 2019-02-19 MED ORDER — BENZONATATE 100 MG PO CAPS: 100.0000 mg | ORAL_CAPSULE | Freq: Three times a day (TID) | ORAL | 1 refills | Status: DC | PRN

## 2019-02-22 ENCOUNTER — Ambulatory Visit (INDEPENDENT_AMBULATORY_CARE_PROVIDER_SITE_OTHER): Payer: Medicare PPO

## 2019-02-22 ENCOUNTER — Other Ambulatory Visit: Payer: Self-pay | Admitting: Internal Medicine

## 2019-02-22 DIAGNOSIS — J309 Allergic rhinitis, unspecified: Secondary | ICD-10-CM | POA: Diagnosis not present

## 2019-02-22 MED ORDER — ALBUTEROL SULFATE (2.5 MG/3ML) 0.083% IN NEBU: 2.5000 mg | INHALATION_SOLUTION | RESPIRATORY_TRACT | 3 refills | Status: DC | PRN

## 2019-02-22 MED ORDER — FLUTICASONE PROPIONATE 50 MCG/ACT NA SUSP: 2.0000 | Freq: Two times a day (BID) | NASAL | 3 refills | Status: DC

## 2019-02-22 MED ORDER — IPRATROPIUM BROMIDE 0.06 % NA SOLN: 1.0000 | Freq: Four times a day (QID) | NASAL | 3 refills | Status: DC

## 2019-02-23 ENCOUNTER — Other Ambulatory Visit: Payer: Self-pay | Admitting: *Deleted

## 2019-02-23 MED ORDER — CETIRIZINE HCL 10 MG PO TABS: 10.0000 mg | ORAL_TABLET | Freq: Every day | ORAL | 1 refills | Status: DC

## 2019-02-23 MED ORDER — LEVOCETIRIZINE DIHYDROCHLORIDE 5 MG PO TABS: 5.0000 mg | ORAL_TABLET | Freq: Every evening | ORAL | 1 refills | Status: DC

## 2019-02-23 MED ORDER — BENZONATATE 100 MG PO CAPS: 100.0000 mg | ORAL_CAPSULE | Freq: Three times a day (TID) | ORAL | 1 refills | Status: DC | PRN

## 2019-02-26 ENCOUNTER — Other Ambulatory Visit: Payer: Self-pay | Admitting: Internal Medicine

## 2019-02-26 ENCOUNTER — Other Ambulatory Visit: Payer: Self-pay

## 2019-02-26 MED ORDER — LEVOCETIRIZINE DIHYDROCHLORIDE 5 MG PO TABS: 5.0000 mg | ORAL_TABLET | Freq: Every evening | ORAL | 1 refills | Status: DC

## 2019-02-26 MED ORDER — FLUTICASONE PROPIONATE 50 MCG/ACT NA SUSP: NASAL | 3 refills | Status: DC

## 2019-03-01 DIAGNOSIS — J3081 Allergic rhinitis due to animal (cat) (dog) hair and dander: Secondary | ICD-10-CM | POA: Diagnosis not present

## 2019-03-01 NOTE — Progress Notes (Signed)
VIALS EXP 02-29-20 

## 2019-03-02 ENCOUNTER — Other Ambulatory Visit: Payer: Self-pay

## 2019-03-02 DIAGNOSIS — J3089 Other allergic rhinitis: Secondary | ICD-10-CM | POA: Diagnosis not present

## 2019-03-09 ENCOUNTER — Inpatient Hospital Stay: Payer: Medicare PPO

## 2019-03-09 ENCOUNTER — Other Ambulatory Visit: Payer: Self-pay

## 2019-03-09 ENCOUNTER — Encounter: Payer: Self-pay | Admitting: Nurse Practitioner

## 2019-03-09 ENCOUNTER — Inpatient Hospital Stay: Payer: Medicare PPO | Attending: Nurse Practitioner | Admitting: Nurse Practitioner

## 2019-03-09 ENCOUNTER — Ambulatory Visit (INDEPENDENT_AMBULATORY_CARE_PROVIDER_SITE_OTHER): Payer: Medicare PPO

## 2019-03-09 VITALS — BP 145/91 | HR 84 | Temp 98.7°F | Resp 17 | Ht 62.0 in | Wt 218.1 lb

## 2019-03-09 DIAGNOSIS — J45909 Unspecified asthma, uncomplicated: Secondary | ICD-10-CM | POA: Insufficient documentation

## 2019-03-09 DIAGNOSIS — Z85038 Personal history of other malignant neoplasm of large intestine: Secondary | ICD-10-CM | POA: Diagnosis not present

## 2019-03-09 DIAGNOSIS — C184 Malignant neoplasm of transverse colon: Secondary | ICD-10-CM | POA: Diagnosis not present

## 2019-03-09 DIAGNOSIS — D649 Anemia, unspecified: Secondary | ICD-10-CM | POA: Insufficient documentation

## 2019-03-09 DIAGNOSIS — J309 Allergic rhinitis, unspecified: Secondary | ICD-10-CM

## 2019-03-09 DIAGNOSIS — Z9221 Personal history of antineoplastic chemotherapy: Secondary | ICD-10-CM | POA: Insufficient documentation

## 2019-03-09 DIAGNOSIS — R918 Other nonspecific abnormal finding of lung field: Secondary | ICD-10-CM | POA: Insufficient documentation

## 2019-03-09 LAB — CEA (IN HOUSE-CHCC): CEA (CHCC-In House): 1.72 ng/mL (ref 0.00–5.00)

## 2019-03-09 NOTE — Progress Notes (Signed)
[place] OFFICE PROGRESS NOTE   Diagnosis: Colon cancer  INTERVAL HISTORY:   [name] returns as scheduled.  She feels well.  No change in bowel habits.  No blood or pain with bowel movements.  No abdominal pain.  She denies nausea/vomiting.  She has a good appetite.  No fever, cough, shortness of breath.  Objective:  Vital signs in last 24 hours:  Blood pressure (!) 145/91, pulse 84, temperature 98.7 F (37.1 C), temperature source Temporal, resp. rate 17, height _0  (1.575 m), weight 218 lb 1.6 oz (98.9 kg), SpO2 100 %.    HEENT: Neck without mass. Lymphatics: No palpable cervical, supraclavicular, axillary or inguinal lymph nodes. Resp: Lungs clear bilaterally. Cardio: Regular rate and rhythm. GI: Abdomen soft and nontender.  No hepatosplenomegaly. Vascular: No leg edema.   Lab Results:  Lab Results  Component Value Date   WBC 5.2 11/10/2018   HGB 11.8 (L) 11/10/2018   HCT 36.7 11/10/2018   MCV 73.6 (L) 11/10/2018   PLT 269.0 11/10/2018   NEUTROABS 3.1 11/10/2018    Imaging:  No results found.  Medications: I have reviewed the patient's current medications.  Assessment/Plan: 1. 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   Presentation to the emergency room 02/02/2013 with a colonic obstruction secondary to tumor at the splenic flexure.   Baseline CEA on 02/02/2013 less than 0.5.   Initiation of adjuvant Xeloda 04/10/2013.   Cycle 2 adjuvant Xeloda 05/01/2013.   Cycle 3 adjuvant Xeloda 05/22/2013.   Cycle 4 adjuvant Xeloda 06/12/2013.   Cycle 5 adjuvant Xeloda 07/03/2013 (Xeloda dose reduced due to hand foot syndrome).   Cycle 6 adjuvant Xeloda 07/24/2013.    Cycle 7 adjuvant Xeloda 08/14/2013   Cycle 8 adjuvant Xeloda 09/07/2013.  Surveillance colonoscopy 11/26/2016-patentend toside colocolonic anastomosis, characterized by healthy-appearing mucosa. Examination otherwise normal. Repeat colonoscopy in 5 years for surveillance. 2. History of iron deficiency anemia. Recurrent anemia 02/01/2014, improved, Persistent red cell microcytosis 3. Asthma. 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 5. Status post ostomy reversal 11/25/2013. 6. CT 02/01/2014 with no evidence of local tumor recurrence or metastatic disease. New area of masslike thickening and small bowel dilatation at the mid small bowel  Status post deep enteroscopy at [facility] on 03/16/2014, negative.  Status post capsule endoscopy 03/22/2014, confirmed a small bowel tumor  Exploratory laparotomy with resection of a small bowel mass on 04/06/2014 with the pathology confirming invasive adenocarcinoma extending through small bowel wall and involving adjacent loops of adherent small bowel, 2 of 12 lymph nodes positive. History consistent with recurrent colon cancer.Loss of MLH1 and PMS 2, MSI high as was the January 2015 tumor  CT abdomen/pelvis 02/03/2015-no evidence of recurrent colon cancer, ventral hernias  CT chest/abdomen/pelvis 03/09/2018-no evidence of recurrent or metastatic disease.  Nonspecific 2 mm posterior right upper lobe nodule.  Continued attention on follow-up exams suggested.    Disposition: [name] remains in clinical remission from colon cancer.  We will follow-up on the CEA from today.  We referred her for a follow-up noncontrast CT scan of the chest to evaluate the 2 mm right upper lobe nodule noted on CT 03/09/2018.  She will have this done in the next few weeks.  She will return for a CEA and follow-up visit in  6 months.  She will contact the office in the interim with any problems.    Ned Card ANP/GNP-BC   03/09/2019  1:12 PM

## 2019-03-10 ENCOUNTER — Telehealth: Payer: Self-pay

## 2019-03-10 NOTE — Telephone Encounter (Signed)
TC  To pt per Ned Card NP to let her know the CEA is stable in normal range. Follow-up as scheduled. Patient verbalized understanding. No further problems or concerns at this time.

## 2019-03-23 ENCOUNTER — Ambulatory Visit (INDEPENDENT_AMBULATORY_CARE_PROVIDER_SITE_OTHER): Payer: Medicare PPO

## 2019-03-23 DIAGNOSIS — J309 Allergic rhinitis, unspecified: Secondary | ICD-10-CM | POA: Diagnosis not present

## 2019-03-26 ENCOUNTER — Other Ambulatory Visit: Payer: Self-pay | Admitting: Internal Medicine

## 2019-03-26 MED ORDER — ALBUTEROL SULFATE HFA 108 (90 BASE) MCG/ACT IN AERS: INHALATION_SPRAY | RESPIRATORY_TRACT | 1 refills | Status: DC

## 2019-03-26 MED ORDER — CETIRIZINE HCL 10 MG PO TABS: 10.0000 mg | ORAL_TABLET | Freq: Every day | ORAL | 1 refills | Status: DC

## 2019-04-05 ENCOUNTER — Ambulatory Visit (INDEPENDENT_AMBULATORY_CARE_PROVIDER_SITE_OTHER): Payer: Medicare PPO

## 2019-04-05 ENCOUNTER — Telehealth: Payer: Self-pay

## 2019-04-05 DIAGNOSIS — J309 Allergic rhinitis, unspecified: Secondary | ICD-10-CM

## 2019-04-05 DIAGNOSIS — G4733 Obstructive sleep apnea (adult) (pediatric): Secondary | ICD-10-CM | POA: Diagnosis not present

## 2019-04-05 NOTE — Telephone Encounter (Signed)
Patient came into the clinic to get her allergy injection and advised to me that she hasn't been able to use her CPAP machine due to malfunction with the machine. Patient was told to contact [employer] [facility] at [phone] but got confused so I called and the respiratory therapist will contact the patient to schedule an appointment to get the machine fix.

## 2019-04-06 NOTE — Telephone Encounter (Signed)
Thank you, Garlon Hatchet! I appreciate you taking such good care of her.   Salvatore Marvel, MD [facility]

## 2019-04-19 ENCOUNTER — Ambulatory Visit (INDEPENDENT_AMBULATORY_CARE_PROVIDER_SITE_OTHER): Payer: Medicare PPO

## 2019-04-19 DIAGNOSIS — J309 Allergic rhinitis, unspecified: Secondary | ICD-10-CM | POA: Diagnosis not present

## 2019-04-26 ENCOUNTER — Ambulatory Visit (INDEPENDENT_AMBULATORY_CARE_PROVIDER_SITE_OTHER): Payer: Medicare PPO

## 2019-04-26 DIAGNOSIS — J309 Allergic rhinitis, unspecified: Secondary | ICD-10-CM

## 2019-04-28 ENCOUNTER — Other Ambulatory Visit: Payer: Self-pay

## 2019-04-28 ENCOUNTER — Ambulatory Visit (INDEPENDENT_AMBULATORY_CARE_PROVIDER_SITE_OTHER): Payer: Medicare PPO | Admitting: Allergy & Immunology

## 2019-04-28 ENCOUNTER — Encounter: Payer: Self-pay | Admitting: Allergy & Immunology

## 2019-04-28 VITALS — BP 134/70 | HR 72 | Temp 97.4°F | Resp 18

## 2019-04-28 DIAGNOSIS — J3089 Other allergic rhinitis: Secondary | ICD-10-CM | POA: Diagnosis not present

## 2019-04-28 DIAGNOSIS — J302 Other seasonal allergic rhinitis: Secondary | ICD-10-CM | POA: Diagnosis not present

## 2019-04-28 DIAGNOSIS — J4540 Moderate persistent asthma, uncomplicated: Secondary | ICD-10-CM | POA: Diagnosis not present

## 2019-04-28 DIAGNOSIS — R05 Cough: Secondary | ICD-10-CM | POA: Diagnosis not present

## 2019-04-28 DIAGNOSIS — R059 Cough, unspecified: Secondary | ICD-10-CM

## 2019-04-28 MED ORDER — BENZONATATE 100 MG PO CAPS: 100.0000 mg | ORAL_CAPSULE | Freq: Three times a day (TID) | ORAL | 1 refills | Status: DC | PRN

## 2019-04-28 MED ORDER — MONTELUKAST SODIUM 10 MG PO TABS: ORAL_TABLET | ORAL | 1 refills | Status: DC

## 2019-04-28 MED ORDER — BUDESONIDE-FORMOTEROL FUMARATE 160-4.5 MCG/ACT IN AERO: 2.0000 | INHALATION_SPRAY | Freq: Two times a day (BID) | RESPIRATORY_TRACT | 5 refills | Status: DC
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
## 2019-04-28 NOTE — Patient Instructions (Addendum)
1. Seasonal and perennial allergic rhinitis (trees, weeds, grasses, molds, dust mites, cat, dog and cockroach) - Continue allergy shots at the same schedule.  - Change the nose sprays to as needed, but restart them daily if you are not doing well on that.  - Continue with Flonase 2 sprays in each nostril once or twice a day and azelastine 2 sprays in each nostril twice a day (AS NEEDED FOR BOTH FOR NOW). - Continue with ipratripium one spray per nostril every 6 hours as needed and Xyzal (levocetirizine) 5mg  tablet once daily - You can use an extra dose of the antihistamine, if needed, for breakthrough symptoms.  - Consider nasal saline rinses 1-2 times daily to remove allergens from the nasal cavities as well as help with mucous clearance (this is especially helpful to do before the nasal sprays are given). - Continue saline nasal rinses - Continue with Tessalon perles as needed.   2. Moderate persistent asthma, uncomplicated - Lung testing looked normal today - We will continue you on Symbicort 160-2 puffs twice a day with a spacer.  - Daily controller medication(s): Symbicort 160/4.46mcg two puffs twice daily with spacer + montelukast 10 mg once a day - Prior to physical activity: albuterol 2 puffs 10-15 minutes before physical activity. - Rescue medications: albuterol 4 puffs every 4-6 hours as needed - Asthma control goals:  * Full participation in all desired activities (may need albuterol before activity) * Albuterol use two time or less a week on average (not counting use with activity) * Cough interfering with sleep two time or less a month * Oral steroids no more than once a year * No hospitalization  3. GERD - Continue avoidance of caffeine, chocolate, and peppermint intake - Continue with omeprazole in the morning. - Continue famotidine 40 mg at night  4. Cough - I would recommend talking to Dr. Benjamine Mola about restarting that gabapentin. - This clearly was working for you.   5.  Return in about 3 months (around 07/28/2019). This can be an in-person, a virtual Webex or a telephone follow up visit.   Please inform us of any Emergency Department visits, hospitalizations, or changes in symptoms. Call us before going to the ED for breathing or allergy symptoms since we might be able to fit you in for a sick visit. Feel free to contact us anytime with any questions, problems, or concerns.  It was a pleasure to see you again today!  Websites that have reliable patient information: 1. American Academy of Asthma, Allergy, and Immunology: www.aaaai.org 2. Food Allergy Research and Education (FARE): foodallergy.org 3. Mothers of Asthmatics: http://www.asthmacommunitynetwork.org 4. American College of Allergy, Asthma, and Immunology: www.acaai.org   COVID-19 Vaccine Information can be found at: ShippingScam.co.uk For questions related to vaccine distribution or appointments, please email [email or web address] or call [phone].     "Like" [place] on Facebook and Instagram for our latest updates!       HAPPY SPRING!  Make sure you are registered to vote! If you have moved or changed any of your contact information, you will need to get this updated before voting!  In some cases, you MAY be able to register to vote online: [email or web address]

## 2019-05-03 ENCOUNTER — Ambulatory Visit (INDEPENDENT_AMBULATORY_CARE_PROVIDER_SITE_OTHER): Payer: Medicare PPO

## 2019-05-03 DIAGNOSIS — J309 Allergic rhinitis, unspecified: Secondary | ICD-10-CM

## 2019-05-11 ENCOUNTER — Ambulatory Visit (INDEPENDENT_AMBULATORY_CARE_PROVIDER_SITE_OTHER): Payer: Medicare PPO | Admitting: Internal Medicine

## 2019-05-11 ENCOUNTER — Encounter: Payer: Self-pay | Admitting: Internal Medicine

## 2019-05-11 ENCOUNTER — Other Ambulatory Visit: Payer: Self-pay

## 2019-05-11 VITALS — BP 128/78 | HR 71 | Temp 98.4°F | Ht 62.0 in | Wt 219.0 lb

## 2019-05-11 DIAGNOSIS — G8929 Other chronic pain: Secondary | ICD-10-CM

## 2019-05-11 DIAGNOSIS — D510 Vitamin B12 deficiency anemia due to intrinsic factor deficiency: Secondary | ICD-10-CM | POA: Diagnosis not present

## 2019-05-11 DIAGNOSIS — C184 Malignant neoplasm of transverse colon: Secondary | ICD-10-CM

## 2019-05-11 DIAGNOSIS — I10 Essential (primary) hypertension: Secondary | ICD-10-CM | POA: Diagnosis not present

## 2019-05-11 DIAGNOSIS — M545 Low back pain: Secondary | ICD-10-CM | POA: Diagnosis not present

## 2019-05-11 DIAGNOSIS — J4520 Mild intermittent asthma, uncomplicated: Secondary | ICD-10-CM

## 2019-05-11 DIAGNOSIS — Z780 Asymptomatic menopausal state: Secondary | ICD-10-CM

## 2019-05-11 MED ORDER — METHOCARBAMOL 500 MG PO TABS: 500.0000 mg | ORAL_TABLET | Freq: Three times a day (TID) | ORAL | 1 refills | Status: DC | PRN

## 2019-05-11 NOTE — Assessment & Plan Note (Signed)
Proair prn, Singulair, Symbicort Allergy shots Flonase Gabapentin for cough

## 2019-05-11 NOTE — Assessment & Plan Note (Signed)
On B12 

## 2019-05-11 NOTE — Progress Notes (Signed)
Subjective:  Patient ID: ILANA FAHEY, female    DOB: [birth year]  Age: 69 y.o. MRN: [id number]  CC: No chief complaint on file.   HPI Hayven R [place] presents for allergies, HTN, B12 def f/u  Outpatient Medications Prior to Visit  Medication Sig Dispense Refill  . albuterol (PROVENTIL) (2.5 MG/3ML) 0.083% nebulizer solution Take 3 mLs (2.5 mg total) by nebulization every 4 (four) hours as needed for wheezing or shortness of breath. 225 mL 3  . albuterol (VENTOLIN HFA) 108 (90 Base) MCG/ACT inhaler INHALE TWO PUFFS BY MOUTH EVERY 6 HOURS AS NEEDED FOR  WHEEZING/SHORTNESS  OF  BREATH 24 g 1  . aspirin 81 MG tablet Take 81 mg by mouth daily.     . benzonatate (TESSALON) 100 MG capsule Take 1 capsule (100 mg total) by mouth 3 (three) times daily as needed for cough. 90 capsule 1  . budesonide-formoterol (SYMBICORT) 160-4.5 MCG/ACT inhaler Inhale 2 puffs into the lungs 2 (two) times daily. Rinse, gargle and spit out after use 1 Inhaler 5  . cetirizine (ZYRTEC) 10 MG tablet Take 1 tablet (10 mg total) by mouth daily. 90 tablet 1  . Cholecalciferol (VITAMIN D) 1000 UNITS capsule Take 1,000 Units by mouth every morning.     . Cyanocobalamin (VITAMIN B-12) 1000 MCG SUBL Place 1 tablet (1,000 mcg total) under the tongue daily. 100 tablet 3  . EPINEPHrine 0.3 mg/0.3 mL IJ SOAJ injection Use as directed for severe allergic reaction 2 Device 1  . famotidine (PEPCID) 40 MG tablet Take 1 tablet (40 mg total) by mouth daily. 90 tablet 3  . ferrous sulfate 325 (65 FE) MG tablet Take 325 mg by mouth 3 (three) times daily with meals.     . fluticasone (FLONASE) 50 MCG/ACT nasal spray 1-2 sprays each nostril once daily (Patient taking differently: 2 sprays 2 (two) times daily. 1-2 sprays each nostril once daily) 48 g 3  . gabapentin (NEURONTIN) 300 MG capsule Take 300 mg by mouth 2 (two) times daily.    . hydrochlorothiazide (HYDRODIURIL) 25 MG tablet Take 1 tablet by mouth once daily 90 tablet 3  .  ipratropium (ATROVENT) 0.06 % nasal spray Place 1 spray into both nostrils 4 (four) times daily. 45 mL 3  . irbesartan (AVAPRO) 150 MG tablet Take 1 tablet by mouth once daily 90 tablet 3  . levocetirizine (XYZAL) 5 MG tablet Take 1 tablet (5 mg total) by mouth every evening. 90 tablet 1  . methocarbamol (ROBAXIN) 500 MG tablet Take 1 tablet (500 mg total) by mouth every 8 (eight) hours as needed for muscle spasms. Muscle spasms 60 tablet 0  . montelukast (SINGULAIR) 10 MG tablet TAKE 1 TABLET BY MOUTH ONCE DAILY AT BEDTIME 90 tablet 1  . omeprazole (PRILOSEC) 20 MG capsule Take 1 capsule by mouth once daily 90 capsule 3  . potassium chloride (KLOR-CON) 10 MEQ tablet Take 1 tablet by mouth once daily 90 tablet 3  . furosemide (LASIX) 40 MG tablet Take 1 tablet (40 mg total) by mouth daily as needed. 30 tablet 3   No facility-administered medications prior to visit.    ROS: Review of Systems  Constitutional: Negative for activity change, appetite change, chills, fatigue and unexpected weight change.  HENT: Negative for congestion, mouth sores and sinus pressure.   Eyes: Negative for visual disturbance.  Respiratory: Negative for cough and chest tightness.   Gastrointestinal: Negative for abdominal pain and nausea.  Genitourinary: Negative for difficulty urinating, frequency  and vaginal pain.  Musculoskeletal: Negative for back pain and gait problem.  Skin: Negative for pallor and rash.  Neurological: Negative for dizziness, tremors, weakness, numbness and headaches.  Psychiatric/Behavioral: Negative for confusion, sleep disturbance and suicidal ideas.    Objective:  BP 128/78 (BP Location: Left Arm, Patient Position: Sitting, Cuff Size: Large)   Pulse 71   Temp 98.4 F (36.9 C) (Oral)   Ht 5\' 2"  (1.575 m)   Wt 219 lb (99.3 kg)   SpO2 96%   BMI 40.06 kg/m   BP Readings from Last 3 Encounters:  05/11/19 128/78  04/28/19 134/70  03/09/19 (!) 145/91    Wt Readings from Last 3  Encounters:  05/11/19 219 lb (99.3 kg)  03/09/19 218 lb 1.6 oz (98.9 kg)  11/10/18 221 lb (100.2 kg)    Physical Exam Constitutional:      General: She is not in acute distress.    Appearance: She is well-developed. She is obese.  HENT:     Head: Normocephalic.     Right Ear: External ear normal.     Left Ear: External ear normal.     Nose: Nose normal.  Eyes:     General:        Right eye: No discharge.        Left eye: No discharge.     Conjunctiva/sclera: Conjunctivae normal.     Pupils: Pupils are equal, round, and reactive to light.  Neck:     Thyroid: No thyromegaly.     Vascular: No JVD.     Trachea: No tracheal deviation.  Cardiovascular:     Rate and Rhythm: Normal rate and regular rhythm.     Heart sounds: Normal heart sounds.  Pulmonary:     Effort: No respiratory distress.     Breath sounds: No stridor. No wheezing.  Abdominal:     General: Bowel sounds are normal. There is no distension.     Palpations: Abdomen is soft. There is no mass.     Tenderness: There is no abdominal tenderness. There is no guarding or rebound.  Musculoskeletal:        General: No tenderness.     Cervical back: Normal range of motion and neck supple.  Lymphadenopathy:     Cervical: No cervical adenopathy.  Skin:    Findings: No erythema or rash.  Neurological:     Cranial Nerves: No cranial nerve deficit.     Motor: No abnormal muscle tone.     Coordination: Coordination normal.     Deep Tendon Reflexes: Reflexes normal.  Psychiatric:        Behavior: Behavior normal.        Thought Content: Thought content normal.        Judgment: Judgment normal.     Lab Results  Component Value Date   WBC 5.2 11/10/2018   HGB 11.8 (L) 11/10/2018   HCT 36.7 11/10/2018   PLT 269.0 11/10/2018   GLUCOSE 97 11/10/2018   CHOL 170 11/10/2018   TRIG 65.0 11/10/2018   HDL 79.00 11/10/2018   LDLCALC 78 11/10/2018   ALT 13 11/10/2018   AST 13 11/10/2018   NA 142 11/10/2018   K 3.7  11/10/2018   CL 104 11/10/2018   CREATININE 0.83 11/10/2018   BUN 14 11/10/2018   CO2 29 11/10/2018   TSH 2.15 11/10/2018   INR 1.07 02/02/2013   HGBA1C 5.5 09/20/2013    CT Chest W Contrast  Result Date: 03/09/2018 CLINICAL DATA:  Colon cancer, abdominal hernia repair. EXAM: CT CHEST, ABDOMEN, AND PELVIS WITH CONTRAST TECHNIQUE: Multidetector CT imaging of the chest, abdomen and pelvis was performed following the standard protocol during bolus administration of intravenous contrast. CONTRAST:  145mL OMNIPAQUE IOHEXOL 300 MG/ML  SOLN COMPARISON:  CT abdomen pelvis 02/04/2015 and CT chest 02/01/2014. FINDINGS: CT CHEST FINDINGS Cardiovascular: Heart is at the upper limits of normal in size to mildly enlarged. No pericardial effusion. Mediastinum/Nodes: No pathologically enlarged mediastinal, hilar or axillary lymph nodes. Esophagus is grossly unremarkable. Lungs/Pleura: 2 mm nodule in the posterior right upper lobe (series 4, image 51), nonspecific. No pleural fluid. Airway is unremarkable. Musculoskeletal: Degenerative changes in the spine and shoulders. No worrisome lytic or sclerotic lesions. CT ABDOMEN PELVIS FINDINGS Hepatobiliary: Liver is unremarkable. Cholecystectomy. No biliary ductal dilatation. Pancreas: Negative. Spleen: Negative. Adrenals/Urinary Tract: Adrenal glands are unremarkable. Low-attenuation lesion in the right kidney measures 1.4 cm and is difficult to further characterize due to size. Ureters are decompressed. Bladder is grossly unremarkable. Stomach/Bowel: Small hiatal hernia. Stomach is otherwise unremarkable. An anastomosis is seen in small bowel in the left abdomen with probable mild associated short segment atony and dilatation. A loop of unobstructed small bowel extends into a low right spigelian hernia. Left hemicolectomy. Colon is otherwise unremarkable. Vascular/Lymphatic: Circumaortic left renal vein. Vascular structures are otherwise unremarkable. Periportal lymph nodes  measure up to 11 mm, as before. Reproductive: Hysterectomy.  No adnexal mass. Other: No free fluid.  Mesenteries and peritoneum are unremarkable. Musculoskeletal: Degenerative changes in the spine and sacroiliac joints. Grade 1 anterolisthesis of L4 on L5. IMPRESSION: 1. No evidence of recurrent or metastatic disease. 2. 2 mm posterior right upper lobe nodule, nonspecific. Continued attention on follow-up exams is suggested. 3. Right pelvic spigelian hernia contains unobstructed small bowel. 4. Electronically Signed   By: Lorin Picket M.D.   On: 03/09/2018 15:30   CT Abdomen Pelvis W Contrast  Result Date: 03/09/2018 CLINICAL DATA:  Colon cancer, abdominal hernia repair. EXAM: CT CHEST, ABDOMEN, AND PELVIS WITH CONTRAST TECHNIQUE: Multidetector CT imaging of the chest, abdomen and pelvis was performed following the standard protocol during bolus administration of intravenous contrast. CONTRAST:  130mL OMNIPAQUE IOHEXOL 300 MG/ML  SOLN COMPARISON:  CT abdomen pelvis 02/04/2015 and CT chest 02/01/2014. FINDINGS: CT CHEST FINDINGS Cardiovascular: Heart is at the upper limits of normal in size to mildly enlarged. No pericardial effusion. Mediastinum/Nodes: No pathologically enlarged mediastinal, hilar or axillary lymph nodes. Esophagus is grossly unremarkable. Lungs/Pleura: 2 mm nodule in the posterior right upper lobe (series 4, image 51), nonspecific. No pleural fluid. Airway is unremarkable. Musculoskeletal: Degenerative changes in the spine and shoulders. No worrisome lytic or sclerotic lesions. CT ABDOMEN PELVIS FINDINGS Hepatobiliary: Liver is unremarkable. Cholecystectomy. No biliary ductal dilatation. Pancreas: Negative. Spleen: Negative. Adrenals/Urinary Tract: Adrenal glands are unremarkable. Low-attenuation lesion in the right kidney measures 1.4 cm and is difficult to further characterize due to size. Ureters are decompressed. Bladder is grossly unremarkable. Stomach/Bowel: Small hiatal hernia.  Stomach is otherwise unremarkable. An anastomosis is seen in small bowel in the left abdomen with probable mild associated short segment atony and dilatation. A loop of unobstructed small bowel extends into a low right spigelian hernia. Left hemicolectomy. Colon is otherwise unremarkable. Vascular/Lymphatic: Circumaortic left renal vein. Vascular structures are otherwise unremarkable. Periportal lymph nodes measure up to 11 mm, as before. Reproductive: Hysterectomy.  No adnexal mass. Other: No free fluid.  Mesenteries and peritoneum are unremarkable. Musculoskeletal: Degenerative changes in the spine and sacroiliac joints.  Grade 1 anterolisthesis of L4 on L5. IMPRESSION: 1. No evidence of recurrent or metastatic disease. 2. 2 mm posterior right upper lobe nodule, nonspecific. Continued attention on follow-up exams is suggested. 3. Right pelvic spigelian hernia contains unobstructed small bowel. 4. Electronically Signed   By: Lorin Picket M.D.   On: 03/09/2018 15:30    Assessment & Plan:    Follow-up: No follow-ups on file.  Walker Kehr, MD

## 2019-05-11 NOTE — Assessment & Plan Note (Signed)
Diovan HCT, Furosemide prn, KCl

## 2019-05-11 NOTE — Assessment & Plan Note (Signed)
F/u w/Dr Sherrill 

## 2019-05-11 NOTE — Assessment & Plan Note (Signed)
Robaxin Rx

## 2019-05-17 ENCOUNTER — Other Ambulatory Visit (INDEPENDENT_AMBULATORY_CARE_PROVIDER_SITE_OTHER): Payer: Medicare PPO

## 2019-05-17 ENCOUNTER — Other Ambulatory Visit: Payer: Self-pay

## 2019-05-17 ENCOUNTER — Ambulatory Visit (INDEPENDENT_AMBULATORY_CARE_PROVIDER_SITE_OTHER)
Admission: RE | Admit: 2019-05-17 | Discharge: 2019-05-17 | Disposition: A | Discharge status: Home / Self Care | Payer: Medicare PPO | Source: Ambulatory Visit | Attending: Internal Medicine | Admitting: Internal Medicine

## 2019-05-17 ENCOUNTER — Ambulatory Visit (INDEPENDENT_AMBULATORY_CARE_PROVIDER_SITE_OTHER): Payer: Medicare PPO

## 2019-05-17 DIAGNOSIS — Z780 Asymptomatic menopausal state: Secondary | ICD-10-CM

## 2019-05-17 DIAGNOSIS — D510 Vitamin B12 deficiency anemia due to intrinsic factor deficiency: Secondary | ICD-10-CM | POA: Diagnosis not present

## 2019-05-17 DIAGNOSIS — I10 Essential (primary) hypertension: Secondary | ICD-10-CM

## 2019-05-17 DIAGNOSIS — J309 Allergic rhinitis, unspecified: Secondary | ICD-10-CM

## 2019-05-17 LAB — HEPATIC FUNCTION PANEL
ALT: 12 U/L (ref 0–35)
AST: 12 U/L (ref 0–37)
Albumin: 3.9 g/dL (ref 3.5–5.2)
Alkaline Phosphatase: 142 U/L — ABNORMAL HIGH (ref 39–117)
Bilirubin, Direct: 0.1 mg/dL (ref 0.0–0.3)
Total Bilirubin: 0.4 mg/dL (ref 0.2–1.2)
Total Protein: 7.1 g/dL (ref 6.0–8.3)

## 2019-05-17 LAB — BASIC METABOLIC PANEL
BUN: 10 mg/dL (ref 6–23)
CO2: 27 mEq/L (ref 19–32)
Calcium: 9.3 mg/dL (ref 8.4–10.5)
Chloride: 104 mEq/L (ref 96–112)
Creatinine, Ser: 0.81 mg/dL (ref 0.40–1.20)
GFR: 84.6 mL/min (ref >60.00)
Glucose, Bld: 128 mg/dL — ABNORMAL HIGH (ref 70–99)
Potassium: 3.6 mEq/L (ref 3.5–5.1)
Sodium: 139 mEq/L (ref 135–145)

## 2019-05-17 LAB — VITAMIN B12: Vitamin B-12: 686 pg/mL (ref 211–911)

## 2019-05-24 ENCOUNTER — Ambulatory Visit (INDEPENDENT_AMBULATORY_CARE_PROVIDER_SITE_OTHER): Payer: Medicare PPO

## 2019-05-24 DIAGNOSIS — J309 Allergic rhinitis, unspecified: Secondary | ICD-10-CM | POA: Diagnosis not present

## 2019-06-01 ENCOUNTER — Ambulatory Visit (INDEPENDENT_AMBULATORY_CARE_PROVIDER_SITE_OTHER): Payer: Medicare PPO

## 2019-06-01 DIAGNOSIS — J309 Allergic rhinitis, unspecified: Secondary | ICD-10-CM | POA: Diagnosis not present

## 2019-06-21 ENCOUNTER — Ambulatory Visit (INDEPENDENT_AMBULATORY_CARE_PROVIDER_SITE_OTHER): Payer: Medicare PPO

## 2019-06-21 DIAGNOSIS — J309 Allergic rhinitis, unspecified: Secondary | ICD-10-CM | POA: Diagnosis not present

## 2019-07-12 ENCOUNTER — Ambulatory Visit (INDEPENDENT_AMBULATORY_CARE_PROVIDER_SITE_OTHER): Payer: Medicare PPO

## 2019-07-12 DIAGNOSIS — J309 Allergic rhinitis, unspecified: Secondary | ICD-10-CM

## 2019-07-19 NOTE — Progress Notes (Signed)
EXP 07/19/20 

## 2019-07-21 DIAGNOSIS — J3081 Allergic rhinitis due to animal (cat) (dog) hair and dander: Secondary | ICD-10-CM | POA: Diagnosis not present

## 2019-07-22 DIAGNOSIS — J3089 Other allergic rhinitis: Secondary | ICD-10-CM | POA: Diagnosis not present

## 2019-07-28 ENCOUNTER — Encounter: Payer: Self-pay | Admitting: Allergy & Immunology

## 2019-07-28 ENCOUNTER — Ambulatory Visit: Payer: Medicare PPO | Admitting: Allergy & Immunology

## 2019-07-28 ENCOUNTER — Other Ambulatory Visit: Payer: Self-pay

## 2019-07-28 VITALS — BP 136/80 | HR 67 | Resp 17

## 2019-07-28 DIAGNOSIS — R053 Chronic cough: Secondary | ICD-10-CM

## 2019-07-28 DIAGNOSIS — J4540 Moderate persistent asthma, uncomplicated: Secondary | ICD-10-CM | POA: Diagnosis not present

## 2019-07-28 DIAGNOSIS — R05 Cough: Secondary | ICD-10-CM

## 2019-07-28 DIAGNOSIS — J3089 Other allergic rhinitis: Secondary | ICD-10-CM | POA: Diagnosis not present

## 2019-07-28 DIAGNOSIS — J302 Other seasonal allergic rhinitis: Secondary | ICD-10-CM | POA: Diagnosis not present

## 2019-07-28 NOTE — Patient Instructions (Addendum)
1. Seasonal and perennial allergic rhinitis (trees, weeds, grasses, molds, dust mites, cat, dog and cockroach) - Continue allergy shots at the same schedule.  - Continue with the daily use of the nose sprays. - Continue with Xyzal (levocetirizine) 5mg  tablet once daily - Consider nasal saline rinses 1-2 times daily to remove allergens from the nasal cavities as well as help with mucous clearance (this is especially helpful to do before the nasal sprays are given). - Continue saline nasal rinses - Continue with Tessalon perles as needed.   2. Moderate persistent asthma, uncomplicated - Lung testing looked stable today.  - We will continue you on Symbicort 160-2 puffs twice a day with a spacer.  - Daily controller medication(s): Symbicort 160/4.69mcg two puffs twice daily with spacer + montelukast 10 mg once a day - Prior to physical activity: albuterol 2 puffs 10-15 minutes before physical activity. - Rescue medications: albuterol 4 puffs every 4-6 hours as needed - Asthma control goals:  * Full participation in all desired activities (may need albuterol before activity) * Albuterol use two time or less a week on average (not counting use with activity) * Cough interfering with sleep two time or less a month * Oral steroids no more than once a year * No hospitalization  3. GERD - Continue avoidance of caffeine, chocolate, and peppermint intake - Continue with omeprazole in the morning. - Continue famotidine 40 mg at night  4. Cough - I would recommend talking to Dr. Benjamine Mola about restarting that gabapentin. - This clearly was working for you.   5. No follow-ups on file. This can be an in-person, a virtual Webex or a telephone follow up visit.   Please inform us of any Emergency Department visits, hospitalizations, or changes in symptoms. Call us before going to the ED for breathing or allergy symptoms since we might be able to fit you in for a sick visit. Feel free to contact us anytime  with any questions, problems, or concerns.  It was a pleasure to see you again today!  Websites that have reliable patient information: 1. American Academy of Asthma, Allergy, and Immunology: www.aaaai.org 2. Food Allergy Research and Education (FARE): foodallergy.org 3. Mothers of Asthmatics: http://www.asthmacommunitynetwork.org 4. American College of Allergy, Asthma, and Immunology: www.acaai.org   COVID-19 Vaccine Information can be found at: ShippingScam.co.uk For questions related to vaccine distribution or appointments, please email [email or web address] or call [phone].     "Like" [place] on Facebook and Instagram for our latest updates!        Make sure you are registered to vote! If you have moved or changed any of your contact information, you will need to get this updated before voting!  In some cases, you MAY be able to register to vote online: [email or web address]
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
## 2019-08-02 ENCOUNTER — Other Ambulatory Visit: Payer: Self-pay | Admitting: Internal Medicine

## 2019-08-03 ENCOUNTER — Ambulatory Visit (INDEPENDENT_AMBULATORY_CARE_PROVIDER_SITE_OTHER): Payer: Medicare PPO

## 2019-08-03 DIAGNOSIS — J302 Other seasonal allergic rhinitis: Secondary | ICD-10-CM | POA: Diagnosis not present

## 2019-08-03 DIAGNOSIS — J3089 Other allergic rhinitis: Secondary | ICD-10-CM

## 2019-08-24 ENCOUNTER — Ambulatory Visit (INDEPENDENT_AMBULATORY_CARE_PROVIDER_SITE_OTHER): Payer: Medicare PPO

## 2019-08-24 DIAGNOSIS — J3089 Other allergic rhinitis: Secondary | ICD-10-CM | POA: Diagnosis not present

## 2019-08-24 DIAGNOSIS — J302 Other seasonal allergic rhinitis: Secondary | ICD-10-CM

## 2019-08-31 ENCOUNTER — Ambulatory Visit: Payer: Medicare PPO | Admitting: Internal Medicine

## 2019-09-06 ENCOUNTER — Inpatient Hospital Stay: Payer: Medicare PPO

## 2019-09-06 ENCOUNTER — Inpatient Hospital Stay: Payer: Medicare PPO | Admitting: Oncology

## 2019-09-07 ENCOUNTER — Ambulatory Visit: Payer: Medicare PPO | Admitting: Internal Medicine

## 2019-09-10 ENCOUNTER — Telehealth: Payer: Self-pay | Admitting: Oncology

## 2019-09-10 ENCOUNTER — Inpatient Hospital Stay (HOSPITAL_BASED_OUTPATIENT_CLINIC_OR_DEPARTMENT_OTHER): Payer: Medicare PPO | Admitting: Oncology

## 2019-09-10 ENCOUNTER — Inpatient Hospital Stay: Payer: Medicare PPO | Attending: Oncology

## 2019-09-10 ENCOUNTER — Other Ambulatory Visit: Payer: Self-pay

## 2019-09-10 VITALS — BP 131/77 | HR 81 | Temp 97.2°F | Resp 18 | Ht 62.0 in | Wt 213.2 lb

## 2019-09-10 DIAGNOSIS — J45909 Unspecified asthma, uncomplicated: Secondary | ICD-10-CM | POA: Insufficient documentation

## 2019-09-10 DIAGNOSIS — Z85038 Personal history of other malignant neoplasm of large intestine: Secondary | ICD-10-CM | POA: Insufficient documentation

## 2019-09-10 DIAGNOSIS — C184 Malignant neoplasm of transverse colon: Secondary | ICD-10-CM | POA: Diagnosis not present

## 2019-09-10 DIAGNOSIS — R911 Solitary pulmonary nodule: Secondary | ICD-10-CM | POA: Diagnosis not present

## 2019-09-10 DIAGNOSIS — L271 Localized skin eruption due to drugs and medicaments taken internally: Secondary | ICD-10-CM | POA: Diagnosis not present

## 2019-09-10 LAB — CEA (IN HOUSE-CHCC): CEA (CHCC-In House): 1.35 ng/mL (ref 0.00–5.00)

## 2019-09-10 NOTE — Progress Notes (Signed)
[facility] OFFICE PROGRESS NOTE   Diagnosis: Colon cancer  INTERVAL HISTORY:   Ms. Goldenstein returns as scheduled. She feels well. Good appetite and energy level. No difficulty with bowel function. She has received the COVID-19 vaccine.   Objective:  Vital signs in last 24 hours:  Blood pressure 131/77, pulse 81, temperature (!) 97.2 F (36.2 C), temperature source Axillary, resp. rate 18, height '5\' 2"'$  (1.575 m), weight 213 lb 3.2 oz (96.7 kg), SpO2 100 %.    HEENT: Neck without mass Lymphatics: No cervical, supraclavicular, axillary, or inguinal nodes Resp: Lungs clear bilaterally Cardio: Regular rate and rhythm GI: No hepatosplenomegaly, nontender, soft rounded fullness inferior to the cholecystectomy scar-hernia?, Lipoma? Vascular: No leg edema  Lab Results:  Lab Results  Component Value Date   WBC 5.2 11/10/2018   HGB 11.8 (L) 11/10/2018   HCT 36.7 11/10/2018   MCV 73.6 (L) 11/10/2018   PLT 269.0 11/10/2018   NEUTROABS 3.1 11/10/2018    CMP  Lab Results  Component Value Date   NA 139 05/17/2019   K 3.6 05/17/2019   CL 104 05/17/2019   CO2 27 05/17/2019   GLUCOSE 128 (H) 05/17/2019   BUN 10 05/17/2019   CREATININE 0.81 05/17/2019   CALCIUM 9.3 05/17/2019   PROT 7.1 05/17/2019   ALBUMIN 3.9 05/17/2019   AST 12 05/17/2019   ALT 12 05/17/2019   ALKPHOS 142 (H) 05/17/2019   BILITOT 0.4 05/17/2019   GFRNONAA >60 03/09/2018   GFRAA >60 03/09/2018    Lab Results  Component Value Date   CEA1 1.72 03/09/2019    Medications: I have reviewed the patient's current medications.   Assessment/Plan: 1. 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   Presentation to the emergency room  02/02/2013 with a colonic obstruction secondary to tumor at the splenic flexure.   Baseline CEA on 02/02/2013 less than 0.5.   Initiation of adjuvant Xeloda 04/10/2013.   Cycle 2 adjuvant Xeloda 05/01/2013.   Cycle 3 adjuvant Xeloda 05/22/2013.   Cycle 4 adjuvant Xeloda 06/12/2013.   Cycle 5 adjuvant Xeloda 07/03/2013 (Xeloda dose reduced due to hand foot syndrome).   Cycle 6 adjuvant Xeloda 07/24/2013.   Cycle 7 adjuvant Xeloda 08/14/2013   Cycle 8 adjuvant Xeloda 09/07/2013.  Surveillance colonoscopy 11/26/2016-patentend toside colocolonic anastomosis, characterized by healthy-appearing mucosa. Examination otherwise normal. Repeat colonoscopy in 5 years for surveillance. 2. History of iron deficiency anemia. Recurrent anemia 02/01/2014, improved, Persistent red cell microcytosis 3. Asthma. 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 5. Status post ostomy reversal 11/25/2013. 6. CT 02/01/2014 with no evidence of local tumor recurrence or metastatic disease. New area of masslike thickening and small bowel dilatation at the mid small bowel  Status post deep enteroscopy at [facility] on 03/16/2014, negative.  Status post capsule endoscopy 03/22/2014, confirmed a small bowel tumor  Exploratory laparotomy with resection of a small bowel mass on 04/06/2014 with the pathology confirming invasive adenocarcinoma extending through small bowel wall and involving adjacent loops of adherent small bowel, 2 of 12 lymph nodes positive. History consistent with recurrent colon cancer.Loss of MLH1 and PMS 2, MSI high as was the January 2015 tumor  CT abdomen/pelvis 02/03/2015-no evidence of recurrent colon cancer, ventral hernias  CT chest/abdomen/pelvis 03/09/2018-no evidence of recurrent or metastatic disease.  Nonspecific 2 mm posterior right upper lobe nodule.  Continued attention on follow-up exams suggested.     Disposition: Ms. Balik is in clinical remission from colon cancer.  We will follow up on the CEA from today. She will return for an office visit and CEA in 6 months. Ms. Hinkley was supposed to have a surveillance chest CT in February. This was ordered, but not scheduled. We reorder the chest CT today.  Betsy Coder, MD  09/10/2019  11:05 AM

## 2019-09-10 NOTE — Telephone Encounter (Signed)
Scheduled per 8/13 los. Printed avs and calendar for pt.

## 2019-09-13 ENCOUNTER — Ambulatory Visit
Admission: RE | Admit: 2019-09-13 | Discharge: 2019-09-13 | Disposition: A | Discharge status: Home / Self Care | Payer: Medicare PPO | Source: Ambulatory Visit | Attending: Oncology | Admitting: Oncology

## 2019-09-13 ENCOUNTER — Other Ambulatory Visit: Payer: Self-pay

## 2019-09-13 ENCOUNTER — Ambulatory Visit (INDEPENDENT_AMBULATORY_CARE_PROVIDER_SITE_OTHER): Payer: Medicare PPO | Admitting: Internal Medicine

## 2019-09-13 ENCOUNTER — Encounter: Payer: Self-pay | Admitting: Internal Medicine

## 2019-09-13 ENCOUNTER — Ambulatory Visit (INDEPENDENT_AMBULATORY_CARE_PROVIDER_SITE_OTHER): Payer: Medicare PPO | Admitting: *Deleted

## 2019-09-13 VITALS — BP 130/70 | HR 55 | Temp 97.6°F | Ht 62.0 in | Wt 212.6 lb

## 2019-09-13 DIAGNOSIS — Z85048 Personal history of other malignant neoplasm of rectum, rectosigmoid junction, and anus: Secondary | ICD-10-CM | POA: Diagnosis not present

## 2019-09-13 DIAGNOSIS — J3089 Other allergic rhinitis: Secondary | ICD-10-CM | POA: Diagnosis not present

## 2019-09-13 DIAGNOSIS — J309 Allergic rhinitis, unspecified: Secondary | ICD-10-CM | POA: Diagnosis not present

## 2019-09-13 DIAGNOSIS — G4733 Obstructive sleep apnea (adult) (pediatric): Secondary | ICD-10-CM

## 2019-09-13 DIAGNOSIS — R590 Localized enlarged lymph nodes: Secondary | ICD-10-CM | POA: Diagnosis not present

## 2019-09-13 DIAGNOSIS — Z9049 Acquired absence of other specified parts of digestive tract: Secondary | ICD-10-CM | POA: Diagnosis not present

## 2019-09-13 DIAGNOSIS — J302 Other seasonal allergic rhinitis: Secondary | ICD-10-CM | POA: Diagnosis not present

## 2019-09-13 DIAGNOSIS — K449 Diaphragmatic hernia without obstruction or gangrene: Secondary | ICD-10-CM | POA: Diagnosis not present

## 2019-09-13 DIAGNOSIS — C184 Malignant neoplasm of transverse colon: Secondary | ICD-10-CM

## 2019-09-13 NOTE — Patient Instructions (Signed)
Order- DME [employer]- please repair or replace leaking humidifier. Continue CPAP 9, mask of choice, humidifier, supplies, AirView/ card   Dr Learta Codding has ordered a CT scan to follow up on your little lung nodule. If he needs our help, he will let us know.  I'm glad gabapentin helped your dry cough.  Continue working with you allergy doctor on the asthma and allergies. If you need my help, please let me know.

## 2019-09-13 NOTE — Assessment & Plan Note (Signed)
Benefits from CPAP. Using sister's humidifier but needs her own replaced or repaired. Plan- continue CPAP 9, DME notified to service humidifier

## 2019-09-13 NOTE — Progress Notes (Signed)
Subjective:    Patient ID: DHALIA ZINGARO, female    DOB: [birth year], 69 y.o.   MRN: [id number]  HPI F never smoker followed for asthma, allergic rhinitis,, OSA, obesity/hypoventilation, complicated by GERD, arthritis, HBP, Colon Ca/ L hemicolectomy NPSG 12/12/05-AHI 38.4/hour, desaturation to 66%, body weight 250 pounds  ---------------------------------------------------------------------------  09/01/2018- Virtual Visit via Telephone Note  History of Present Illness: 69 year old female never smoker followed for asthma, allergic rhinitis, OSA, obesity/hypoventilation, complicated by GERD, arthritis, HBP, colon cancer/left hemicolectomy   Allergy vaccine- Dr Ernst Bowler CPAP 9/[facility] Cov[facility] careful- discussed. No download for televisit, but she reports using CPAP every night. Some mouth breathing and willing to try chin strap.  Uses gabapentin some to help cough and it also helps sleep.   Observations/Objective:   Assessment and Plan: OSA- Benefits from CPAP. Plan- add chin strap Asthma- working with Allergy, on vaccine. Notes gabapentin some help for cough.  Follow Up Instructions:   09/13/19- 69 year old female never smoker followed for OSA, complicated by asthma, allergic rhinitis,  obesity/hypoventilation,  GERD, arthritis, HBP, colon cancer/left hemicolectomy   Allergy vaccine- Dr Ernst Bowler CPAP 9/[employer]  Followed by Dr Learta Codding for Colon cancer in remission. He is aware of lung nodule. F/u CT chest pending 09/28/19. Has crack in humidifier chamber- for some reason [employer] says they need the serial number from her machine to fix this. Unclear why they don't have that?? Had 2 Moderna Covax Saw Dr Teoh/ ENT for dry cough, was scoped, dx'c upper airway cough, given gabapentin which helped.  Continues f/u with Allergy on vaccine for allergy and asthma. Feels controlled.  CT chest 03/09/18-  IMPRESSION: 1. No evidence of recurrent or metastatic disease. 2. 2 mm posterior right upper  lobe nodule, nonspecific. Continued attention on follow-up exams is suggested. 3. Right pelvic spigelian hernia contains unobstructed small bowel. 4.  ROS-see HPI   + = positive Constitutional:   No-   weight loss, night sweats, fevers, chills, fatigue, lassitude. HEENT:   No-  headaches, difficulty swallowing, tooth/dental problems, sore throat,       No-  sneezing, itching, ear ache, nasal congestion, + post nasal drip,  CV:  No-   chest pain, orthopnea, PND, swelling in lower extremities, anasarca, dizziness, palpitations Resp: No- acute  shortness of breath with exertion or at rest.              No-   productive cough,  + non-productive cough,  No- coughing up of blood.              No-   change in color of mucus.  No-sustained wheezing.   Skin: No-   rash or lesions. GI:  + heartburn, indigestion, no-abdominal pain, nausea, vomiting,  GU: No  complaint MS:  No-   joint pain or swelling.   Neuro-     nothing unusual Psych:  No- change in mood or affect. No depression or anxiety.  No memory loss.  OBJ- Physical Exam   similar to previous exams General- Alert, Oriented, Affect-appropriate, Distress- none acute, +obese Skin- rash-none, lesions- none, excoriation- none Lymphadenopathy- none Head- atraumatic            Eyes- Gross vision intact, PERRLA, conjunctivae and secretions clear            Ears- Hearing, canals-normal            Nose- Clear, no-Septal dev, mucus, polyps, erosion, perforation             Throat- Mallampati  III , mucosa clear,not dry , drainage- none, tonsils- atrophic,  Neck- flexible , trachea midline, no stridor , thyroid nl, carotid no bruit Chest - symmetrical excursion , unlabored           Heart/CV- RRR , no murmur , no gallop  , no rub, nl s1 s2                           - JVD- none , edema- none, stasis changes- none, varices- none           Lung- clear to P&A, wheeze- none, cough-none , dullness-none, rub- none           Chest wall-  Abd-  Br/ Gen/  Rectal- Not done, not indicated Extrem- cyanosis- none, clubbing, none, atrophy- none, strength- nl Neuro- grossly intact to observation

## 2019-09-13 NOTE — Assessment & Plan Note (Signed)
She understands to contact her allergist as needed

## 2019-09-21 ENCOUNTER — Telehealth: Payer: Self-pay | Admitting: *Deleted

## 2019-09-21 NOTE — Telephone Encounter (Signed)
Notified that CT scan was negative for any cancer and f/u as scheduled.

## 2019-09-21 NOTE — Telephone Encounter (Signed)
-----   Message from Ladell Pier, MD sent at 09/16/2019  9:36 PM EDT -----  ----- Message ----- From: Ladell Pier, MD Sent: 09/16/2019   9:07 PM EDT To: Arlice Colt Pod 2  Please call patient, CT is negative for cancer, f/u as scheduled

## 2019-10-05 ENCOUNTER — Ambulatory Visit (INDEPENDENT_AMBULATORY_CARE_PROVIDER_SITE_OTHER): Payer: Medicare PPO

## 2019-10-05 DIAGNOSIS — J309 Allergic rhinitis, unspecified: Secondary | ICD-10-CM

## 2019-10-16 DIAGNOSIS — G4733 Obstructive sleep apnea (adult) (pediatric): Secondary | ICD-10-CM | POA: Diagnosis not present

## 2019-10-25 ENCOUNTER — Ambulatory Visit (INDEPENDENT_AMBULATORY_CARE_PROVIDER_SITE_OTHER): Payer: Medicare PPO | Admitting: *Deleted

## 2019-10-25 DIAGNOSIS — J309 Allergic rhinitis, unspecified: Secondary | ICD-10-CM | POA: Diagnosis not present

## 2019-11-01 ENCOUNTER — Ambulatory Visit (INDEPENDENT_AMBULATORY_CARE_PROVIDER_SITE_OTHER): Payer: Medicare PPO

## 2019-11-01 DIAGNOSIS — J309 Allergic rhinitis, unspecified: Secondary | ICD-10-CM | POA: Diagnosis not present

## 2019-11-08 ENCOUNTER — Ambulatory Visit (INDEPENDENT_AMBULATORY_CARE_PROVIDER_SITE_OTHER): Payer: Medicare PPO

## 2019-11-08 DIAGNOSIS — J309 Allergic rhinitis, unspecified: Secondary | ICD-10-CM | POA: Diagnosis not present

## 2019-11-10 ENCOUNTER — Ambulatory Visit: Payer: Medicare PPO | Admitting: Internal Medicine

## 2019-11-17 ENCOUNTER — Ambulatory Visit (INDEPENDENT_AMBULATORY_CARE_PROVIDER_SITE_OTHER): Payer: Medicare PPO | Admitting: Internal Medicine

## 2019-11-17 ENCOUNTER — Other Ambulatory Visit: Payer: Self-pay

## 2019-11-17 ENCOUNTER — Encounter: Payer: Self-pay | Admitting: Internal Medicine

## 2019-11-17 VITALS — BP 124/80 | HR 79 | Temp 98.3°F | Ht 62.0 in | Wt 209.4 lb

## 2019-11-17 DIAGNOSIS — I10 Essential (primary) hypertension: Secondary | ICD-10-CM

## 2019-11-17 DIAGNOSIS — J4540 Moderate persistent asthma, uncomplicated: Secondary | ICD-10-CM | POA: Diagnosis not present

## 2019-11-17 DIAGNOSIS — D510 Vitamin B12 deficiency anemia due to intrinsic factor deficiency: Secondary | ICD-10-CM

## 2019-11-17 DIAGNOSIS — Z6841 Body Mass Index (BMI) 40.0 and over, adult: Secondary | ICD-10-CM | POA: Diagnosis not present

## 2019-11-17 DIAGNOSIS — Z23 Encounter for immunization: Secondary | ICD-10-CM | POA: Diagnosis not present

## 2019-11-17 DIAGNOSIS — C184 Malignant neoplasm of transverse colon: Secondary | ICD-10-CM

## 2019-11-17 LAB — CBC WITH DIFFERENTIAL/PLATELET
Basophils Absolute: 0.1 10*3/uL (ref 0.0–0.1)
Basophils Relative: 1.3 % (ref 0.0–3.0)
Eosinophils Absolute: 0.1 10*3/uL (ref 0.0–0.7)
Eosinophils Relative: 1.2 % (ref 0.0–5.0)
HCT: 36.8 % (ref 36.0–46.0)
Hemoglobin: 12.1 g/dL (ref 12.0–15.0)
Lymphocytes Relative: 22.4 % (ref 12.0–46.0)
Lymphs Abs: 1.3 10*3/uL (ref 0.7–4.0)
MCHC: 32.8 g/dL (ref 30.0–36.0)
MCV: 72.7 fl — ABNORMAL LOW (ref 78.0–100.0)
Monocytes Absolute: 0.7 10*3/uL (ref 0.1–1.0)
Monocytes Relative: 11.4 % (ref 3.0–12.0)
Neutro Abs: 3.7 10*3/uL (ref 1.4–7.7)
Neutrophils Relative %: 63.7 % (ref 43.0–77.0)
Platelets: 237 10*3/uL (ref 150.0–400.0)
RBC: 5.06 Mil/uL (ref 3.87–5.11)
RDW: 17 % — ABNORMAL HIGH (ref 11.5–15.5)
WBC: 5.8 10*3/uL (ref 4.0–10.5)

## 2019-11-17 LAB — COMPREHENSIVE METABOLIC PANEL
ALT: 15 U/L (ref 0–35)
AST: 15 U/L (ref 0–37)
Albumin: 3.9 g/dL (ref 3.5–5.2)
Alkaline Phosphatase: 144 U/L — ABNORMAL HIGH (ref 39–117)
BUN: 14 mg/dL (ref 6–23)
CO2: 27 mEq/L (ref 19–32)
Calcium: 9.6 mg/dL (ref 8.4–10.5)
Chloride: 102 mEq/L (ref 96–112)
Creatinine, Ser: 0.79 mg/dL (ref 0.40–1.20)
GFR: 75.61 mL/min (ref >60.00)
Glucose, Bld: 99 mg/dL (ref 70–99)
Potassium: 3.6 mEq/L (ref 3.5–5.1)
Sodium: 139 mEq/L (ref 135–145)
Total Bilirubin: 0.5 mg/dL (ref 0.2–1.2)
Total Protein: 7 g/dL (ref 6.0–8.3)

## 2019-11-17 NOTE — Patient Instructions (Signed)
  Cabbage soup recipe that will not make you gain weight: Take 1 small head of cabbage, 1 average pack of celery, 4 green peppers, 4 onions, 2 cans diced tomatoes (they are not available without salt), salt and spices to taste.  Chop cabbage, celery, peppers and onions.  And tomatoes and 2-2.5 liters (2.5 quarts) of water so that it would just cover the vegetables.  Bring to boil.  Add spices and salt.  Turn heat to low/medium and simmer for 20-25 minutes.  Naturally, you can make a smaller batch and change some of the ingredients.

## 2019-11-17 NOTE — Addendum Note (Signed)
Addended by: Rolm Baptise on: 11/17/2019 11:34 AM   Modules accepted: Orders

## 2019-11-17 NOTE — Assessment & Plan Note (Signed)
Labs

## 2019-11-17 NOTE — Assessment & Plan Note (Signed)
Doing fair 

## 2019-11-17 NOTE — Assessment & Plan Note (Signed)
Wt Readings from Last 3 Encounters:  11/17/19 209 lb 6.4 oz (95 kg)  09/13/19 212 lb 9.6 oz (96.4 kg)  09/10/19 213 lb 3.2 oz (96.7 kg)

## 2019-11-17 NOTE — Assessment & Plan Note (Addendum)
On B12 CBC

## 2019-11-17 NOTE — Assessment & Plan Note (Signed)
On Rx 

## 2019-11-17 NOTE — Progress Notes (Signed)
Subjective:  Patient ID: [name], female    DOB: Dec 13, 1950  Age: 69 y.o. MRN: [id number]  CC: Follow-up   HPI [name] presents for asthma, B12 def, HTN f/u  Outpatient Medications Prior to Visit  Medication Sig Dispense Refill  . albuterol (PROVENTIL) (2.5 MG/3ML) 0.083% nebulizer solution Take 3 mLs (2.5 mg total) by nebulization every 4 (four) hours as needed for wheezing or shortness of breath. 225 mL 3  . albuterol (VENTOLIN HFA) 108 (90 Base) MCG/ACT inhaler INHALE TWO PUFFS BY MOUTH EVERY 6 HOURS AS NEEDED FOR  WHEEZING/SHORTNESS  OF  BREATH 24 g 1  . aspirin 81 MG tablet Take 81 mg by mouth daily.     . benzonatate (TESSALON) 100 MG capsule Take 1 capsule (100 mg total) by mouth 3 (three) times daily as needed for cough. 90 capsule 1  . budesonide-formoterol (SYMBICORT) 160-4.5 MCG/ACT inhaler Inhale 2 puffs into the lungs 2 (two) times daily. Rinse, gargle and spit out after use 1 Inhaler 5  . cetirizine (ZYRTEC) 10 MG tablet Take 1 tablet (10 mg total) by mouth daily. 90 tablet 1  . Cholecalciferol (VITAMIN D) 1000 UNITS capsule Take 1,000 Units by mouth every morning.     . Cyanocobalamin (VITAMIN B-12) 1000 MCG SUBL Place 1 tablet (1,000 mcg total) under the tongue daily. 100 tablet 3  . EPINEPHrine 0.3 mg/0.3 mL IJ SOAJ injection Use as directed for severe allergic reaction 2 Device 1  . famotidine (PEPCID) 40 MG tablet Take 1 tablet (40 mg total) by mouth daily. Annual appt due in Oct must see provider for future refills 90 tablet 0  . ferrous sulfate 325 (65 FE) MG tablet Take 325 mg by mouth 3 (three) times daily with meals.     . fluticasone (FLONASE) 50 MCG/ACT nasal spray 1-2 sprays each nostril once daily (Patient taking differently: 2 sprays 2 (two) times daily. 1-2 sprays each nostril once daily) 48 g 3  . gabapentin (NEURONTIN) 300 MG capsule Take 300 mg by mouth 2 (two) times daily.    . hydrochlorothiazide (HYDRODIURIL) 25 MG tablet Take 1  tablet by mouth once daily 90 tablet 3  . ipratropium (ATROVENT) 0.06 % nasal spray Place 1 spray into both nostrils 4 (four) times daily. 45 mL 3  . irbesartan (AVAPRO) 150 MG tablet Take 1 tablet by mouth once daily 90 tablet 3  . levocetirizine (XYZAL) 5 MG tablet Take 1 tablet (5 mg total) by mouth every evening. 90 tablet 1  . methocarbamol (ROBAXIN) 500 MG tablet Take 1 tablet (500 mg total) by mouth every 8 (eight) hours as needed for muscle spasms. Muscle spasms 180 tablet 1  . montelukast (SINGULAIR) 10 MG tablet TAKE 1 TABLET BY MOUTH ONCE DAILY AT BEDTIME 90 tablet 1  . omeprazole (PRILOSEC) 20 MG capsule Take 1 capsule by mouth once daily 90 capsule 3  . potassium chloride (KLOR-CON) 10 MEQ tablet Take 1 tablet by mouth once daily 90 tablet 3  . furosemide (LASIX) 40 MG tablet Take 1 tablet (40 mg total) by mouth daily as needed. 30 tablet 3   No facility-administered medications prior to visit.    ROS: Review of Systems  Constitutional: Negative for activity change, appetite change, chills, fatigue and unexpected weight change.  HENT: Negative for congestion, mouth sores and sinus pressure.   Eyes: Negative for visual disturbance.  Respiratory: Negative for cough and chest tightness.   Gastrointestinal: Negative for abdominal pain and nausea.  Genitourinary: Negative for difficulty urinating, frequency and vaginal pain.  Musculoskeletal: Negative for back pain and gait problem.  Skin: Negative for pallor and rash.  Neurological: Negative for dizziness, tremors, weakness, numbness and headaches.  Psychiatric/Behavioral: Negative for confusion and sleep disturbance.    Objective:  BP 124/80 (BP Location: Right Arm, Patient Position: Standing, Cuff Size: Large)   Pulse 79   Temp 98.3 F (36.8 C) (Oral)   Ht 5\' 2"  (1.575 m)   Wt 209 lb 6.4 oz (95 kg)   SpO2 96%   BMI 38.30 kg/m   BP Readings from Last 3 Encounters:  11/17/19 124/80  09/13/19 130/70  09/10/19 131/77      Wt Readings from Last 3 Encounters:  11/17/19 209 lb 6.4 oz (95 kg)  09/13/19 212 lb 9.6 oz (96.4 kg)  09/10/19 213 lb 3.2 oz (96.7 kg)    Physical Exam Constitutional:      General: She is not in acute distress.    Appearance: She is well-developed. She is obese.  HENT:     Head: Normocephalic.     Right Ear: External ear normal.     Left Ear: External ear normal.     Nose: Nose normal.  Eyes:     General:        Right eye: No discharge.        Left eye: No discharge.     Conjunctiva/sclera: Conjunctivae normal.     Pupils: Pupils are equal, round, and reactive to light.  Neck:     Thyroid: No thyromegaly.     Vascular: No JVD.     Trachea: No tracheal deviation.  Cardiovascular:     Rate and Rhythm: Normal rate and regular rhythm.     Heart sounds: Normal heart sounds.  Pulmonary:     Effort: No respiratory distress.     Breath sounds: No stridor. No wheezing.  Abdominal:     General: Bowel sounds are normal. There is no distension.     Palpations: Abdomen is soft. There is no mass.     Tenderness: There is no abdominal tenderness. There is no guarding or rebound.  Musculoskeletal:        General: Tenderness present.     Cervical back: Normal range of motion and neck supple.  Lymphadenopathy:     Cervical: No cervical adenopathy.  Skin:    Findings: No erythema or rash.  Neurological:     Mental Status: She is oriented to person, place, and time.     Cranial Nerves: No cranial nerve deficit.     Motor: No abnormal muscle tone.     Coordination: Coordination normal.     Deep Tendon Reflexes: Reflexes normal.  Psychiatric:        Behavior: Behavior normal.        Thought Content: Thought content normal.        Judgment: Judgment normal.     Lab Results  Component Value Date   WBC 5.2 11/10/2018   HGB 11.8 (L) 11/10/2018   HCT 36.7 11/10/2018   PLT 269.0 11/10/2018   GLUCOSE 128 (H) 05/17/2019   CHOL 170 11/10/2018   TRIG 65.0 11/10/2018   HDL  79.00 11/10/2018   LDLCALC 78 11/10/2018   ALT 12 05/17/2019   AST 12 05/17/2019   NA 139 05/17/2019   K 3.6 05/17/2019   CL 104 05/17/2019   CREATININE 0.81 05/17/2019   BUN 10 05/17/2019   CO2 27 05/17/2019   TSH  2.15 11/10/2018   INR 1.07 02/02/2013   HGBA1C 5.5 09/20/2013    CT CHEST WO CONTRAST  Result Date: 09/13/2019 CLINICAL DATA:  69 year old female with history of colorectal cancer. Surveillance scan. EXAM: CT CHEST WITHOUT CONTRAST TECHNIQUE: Multidetector CT imaging of the chest was performed following the standard protocol without IV contrast. COMPARISON:  Chest CT 03/09/2018. FINDINGS: Cardiovascular: Heart size is normal. There is no significant pericardial fluid, thickening or pericardial calcification. No atherosclerotic calcifications in the thoracic aorta or the coronary arteries. Mediastinum/Nodes: No pathologically enlarged mediastinal or hilar lymph nodes. Please note that accurate exclusion of hilar adenopathy is limited on noncontrast CT scans. Small hiatal hernia. No axillary lymphadenopathy. Lungs/Pleura: No suspicious appearing pulmonary nodules or masses are noted. No acute consolidative airspace disease. No pleural effusions. Upper Abdomen: Status post cholecystectomy. Musculoskeletal: There are no aggressive appearing lytic or blastic lesions noted in the visualized portions of the skeleton. IMPRESSION: 1. No evidence to suggest metastatic disease to the chest. 2. Small hiatal hernia Electronically Signed   By: Vinnie Langton M.D.   On: 09/13/2019 14:43    Assessment & Plan:    Walker Kehr, MD

## 2019-11-17 NOTE — Addendum Note (Signed)
Addended by: Cresenciano Lick on: 11/17/2019 11:34 AM   Modules accepted: Orders

## 2019-11-18 ENCOUNTER — Other Ambulatory Visit: Payer: Self-pay | Admitting: Internal Medicine

## 2019-11-18 DIAGNOSIS — G8929 Other chronic pain: Secondary | ICD-10-CM

## 2019-11-22 ENCOUNTER — Ambulatory Visit (INDEPENDENT_AMBULATORY_CARE_PROVIDER_SITE_OTHER): Payer: Medicare PPO | Admitting: *Deleted

## 2019-11-22 DIAGNOSIS — J309 Allergic rhinitis, unspecified: Secondary | ICD-10-CM | POA: Diagnosis not present

## 2019-11-29 ENCOUNTER — Ambulatory Visit (INDEPENDENT_AMBULATORY_CARE_PROVIDER_SITE_OTHER): Payer: Medicare PPO

## 2019-11-29 DIAGNOSIS — J309 Allergic rhinitis, unspecified: Secondary | ICD-10-CM | POA: Diagnosis not present

## 2019-12-06 ENCOUNTER — Ambulatory Visit (INDEPENDENT_AMBULATORY_CARE_PROVIDER_SITE_OTHER): Payer: Medicare PPO | Admitting: *Deleted

## 2019-12-06 DIAGNOSIS — J309 Allergic rhinitis, unspecified: Secondary | ICD-10-CM

## 2019-12-27 ENCOUNTER — Ambulatory Visit (INDEPENDENT_AMBULATORY_CARE_PROVIDER_SITE_OTHER): Payer: Medicare PPO

## 2019-12-27 DIAGNOSIS — J309 Allergic rhinitis, unspecified: Secondary | ICD-10-CM | POA: Diagnosis not present

## 2020-01-07 ENCOUNTER — Other Ambulatory Visit: Payer: Self-pay | Admitting: Internal Medicine

## 2020-01-17 ENCOUNTER — Telehealth: Payer: Self-pay

## 2020-01-17 NOTE — Telephone Encounter (Signed)
Patient received her Covid Moderna booster on 01/04/2020 around 12 in the afternoon. She stated she had lip swelling on the right side that evening and tingling sensation which she slept it off and next day had a headache. She experience this for 1 week and had rash and soreness in the left side of her neck. Wanted to know if it was a possible reaction to the vaccine. After discussing the COVID algorithm she did not mention any further issues. Is scheduled to see dr. Ernst Bowler on 01/26/2020 and wanted to see if it is okay to resume her allergy injections at that time.

## 2020-01-18 NOTE — Telephone Encounter (Signed)
Awesome - we will chat then.  Salvatore Marvel, MD [facility]

## 2020-01-26 ENCOUNTER — Other Ambulatory Visit: Payer: Self-pay

## 2020-01-26 ENCOUNTER — Ambulatory Visit (INDEPENDENT_AMBULATORY_CARE_PROVIDER_SITE_OTHER): Payer: Medicare PPO | Admitting: Allergy & Immunology

## 2020-01-26 ENCOUNTER — Ambulatory Visit (INDEPENDENT_AMBULATORY_CARE_PROVIDER_SITE_OTHER): Payer: Medicare PPO

## 2020-01-26 ENCOUNTER — Encounter: Payer: Self-pay | Admitting: Allergy & Immunology

## 2020-01-26 VITALS — BP 118/74 | HR 66 | Temp 98.1°F | Resp 18 | Ht 61.81 in | Wt 211.8 lb

## 2020-01-26 DIAGNOSIS — R053 Chronic cough: Secondary | ICD-10-CM

## 2020-01-26 DIAGNOSIS — J302 Other seasonal allergic rhinitis: Secondary | ICD-10-CM | POA: Diagnosis not present

## 2020-01-26 DIAGNOSIS — J3089 Other allergic rhinitis: Secondary | ICD-10-CM

## 2020-01-26 DIAGNOSIS — J309 Allergic rhinitis, unspecified: Secondary | ICD-10-CM

## 2020-01-26 DIAGNOSIS — J4540 Moderate persistent asthma, uncomplicated: Secondary | ICD-10-CM | POA: Diagnosis not present

## 2020-01-26 DIAGNOSIS — T50Z95D Adverse effect of other vaccines and biological substances, subsequent encounter: Secondary | ICD-10-CM
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
## 2020-01-26 NOTE — Patient Instructions (Addendum)
1. Seasonal and perennial allergic rhinitis (trees, weeds, grasses, molds, dust mites, cat, dog and cockroach) - Continue allergy shots at the same schedule.  - We will restart at 0.3 mL of the Red Vial and advance on Schedule C from there.  - Continue with the daily use of the nose sprays. - Continue with Xyzal (levocetirizine) 5mg  tablet once daily - Consider nasal saline rinses 1-2 times daily to remove allergens from the nasal cavities as well as help with mucous clearance (this is especially helpful to do before the nasal sprays are given). - Continue saline nasal rinses - Continue with Tessalon perles as needed.   2. Moderate persistent asthma, uncomplicated - Lung testing not done today since it is not working. - We are not going to make any medication changes at this time.  - We will continue you on Symbicort 160-2 puffs twice a day with a spacer.  - Daily controller medication(s): Symbicort 160/4.47mcg two puffs twice daily with spacer + montelukast 10 mg once a day - Prior to physical activity: albuterol 2 puffs 10-15 minutes before physical activity. - Rescue medications: albuterol 4 puffs every 4-6 hours as needed - Asthma control goals:  * Full participation in all desired activities (may need albuterol before activity) * Albuterol use two time or less a week on average (not counting use with activity) * Cough interfering with sleep two time or less a month * Oral steroids no more than once a year * No hospitalization  3. GERD - Continue avoidance of caffeine, chocolate, and peppermint intake - Continue with omeprazole in the morning. - Continue famotidine 40 mg at night  4. Return in about 6 months (around 07/26/2020).    Please inform 07/28/2020 of any Emergency Department visits, hospitalizations, or changes in symptoms. Call us before going to the ED for breathing or allergy symptoms since we might be able to fit you in for a sick visit. Feel free to contact us anytime with any  questions, problems, or concerns.  It was a pleasure to see you again today! It is always good to see you!   Websites that have reliable patient information: 1. American Academy of Asthma, Allergy, and Immunology: www.aaaai.org 2. Food Allergy Research and Education (FARE): foodallergy.org 3. Mothers of Asthmatics: http://www.asthmacommunitynetwork.org 4. American College of Allergy, Asthma, and Immunology: www.acaai.org   COVID-19 Vaccine Information can be found at: [place] For questions related to vaccine distribution or appointments, please email [email or web address] or call [phone].     "Like" [phone] on Facebook and Instagram for our latest updates!       Make sure you are registered to vote! If you have moved or changed any of your contact information, you will need to get this updated before voting!  In some cases, you MAY be able to register to vote online: [place]

## 2020-01-31 NOTE — Progress Notes (Signed)
VIALS EXP 01-31-21 

## 2020-02-01 DIAGNOSIS — J3081 Allergic rhinitis due to animal (cat) (dog) hair and dander: Secondary | ICD-10-CM | POA: Diagnosis not present

## 2020-02-02 DIAGNOSIS — J3089 Other allergic rhinitis: Secondary | ICD-10-CM | POA: Diagnosis not present

## 2020-02-17 ENCOUNTER — Ambulatory Visit (INDEPENDENT_AMBULATORY_CARE_PROVIDER_SITE_OTHER): Payer: Medicare PPO

## 2020-02-17 DIAGNOSIS — J309 Allergic rhinitis, unspecified: Secondary | ICD-10-CM

## 2020-02-21 ENCOUNTER — Other Ambulatory Visit: Payer: Self-pay

## 2020-02-22 ENCOUNTER — Encounter (INDEPENDENT_AMBULATORY_CARE_PROVIDER_SITE_OTHER): Payer: Self-pay

## 2020-02-22 ENCOUNTER — Ambulatory Visit (INDEPENDENT_AMBULATORY_CARE_PROVIDER_SITE_OTHER): Payer: Medicare PPO

## 2020-02-22 ENCOUNTER — Ambulatory Visit (INDEPENDENT_AMBULATORY_CARE_PROVIDER_SITE_OTHER): Payer: Medicare PPO | Admitting: Internal Medicine

## 2020-02-22 ENCOUNTER — Encounter: Payer: Self-pay | Admitting: Internal Medicine

## 2020-02-22 VITALS — BP 124/70 | HR 73 | Temp 98.2°F | Ht 62.0 in | Wt 211.0 lb

## 2020-02-22 DIAGNOSIS — C184 Malignant neoplasm of transverse colon: Secondary | ICD-10-CM | POA: Diagnosis not present

## 2020-02-22 DIAGNOSIS — Z6841 Body Mass Index (BMI) 40.0 and over, adult: Secondary | ICD-10-CM | POA: Diagnosis not present

## 2020-02-22 DIAGNOSIS — I10 Essential (primary) hypertension: Secondary | ICD-10-CM | POA: Diagnosis not present

## 2020-02-22 DIAGNOSIS — D510 Vitamin B12 deficiency anemia due to intrinsic factor deficiency: Secondary | ICD-10-CM | POA: Diagnosis not present

## 2020-02-22 DIAGNOSIS — Z0000 Encounter for general adult medical examination without abnormal findings: Secondary | ICD-10-CM | POA: Diagnosis not present

## 2020-02-22 LAB — COMPREHENSIVE METABOLIC PANEL
ALT: 13 U/L (ref 0–35)
AST: 14 U/L (ref 0–37)
Albumin: 4.2 g/dL (ref 3.5–5.2)
Alkaline Phosphatase: 134 U/L — ABNORMAL HIGH (ref 39–117)
BUN: 18 mg/dL (ref 6–23)
CO2: 32 mEq/L (ref 19–32)
Calcium: 10 mg/dL (ref 8.4–10.5)
Chloride: 102 mEq/L (ref 96–112)
Creatinine, Ser: 0.91 mg/dL (ref 0.40–1.20)
GFR: 63.83 mL/min (ref >60.00)
Glucose, Bld: 95 mg/dL (ref 70–99)
Potassium: 3.8 mEq/L (ref 3.5–5.1)
Sodium: 140 mEq/L (ref 135–145)
Total Bilirubin: 0.6 mg/dL (ref 0.2–1.2)
Total Protein: 7.5 g/dL (ref 6.0–8.3)

## 2020-02-22 LAB — CBC WITH DIFFERENTIAL/PLATELET
Basophils Absolute: 0 10*3/uL (ref 0.0–0.1)
Basophils Relative: 0.7 % (ref 0.0–3.0)
Eosinophils Absolute: 0.1 10*3/uL (ref 0.0–0.7)
Eosinophils Relative: 1.2 % (ref 0.0–5.0)
HCT: 37 % (ref 36.0–46.0)
Hemoglobin: 11.8 g/dL — ABNORMAL LOW (ref 12.0–15.0)
Lymphocytes Relative: 26.8 % (ref 12.0–46.0)
Lymphs Abs: 1.5 10*3/uL (ref 0.7–4.0)
MCHC: 32 g/dL (ref 30.0–36.0)
MCV: 72.9 fl — ABNORMAL LOW (ref 78.0–100.0)
Monocytes Absolute: 0.6 10*3/uL (ref 0.1–1.0)
Monocytes Relative: 10.6 % (ref 3.0–12.0)
Neutro Abs: 3.5 10*3/uL (ref 1.4–7.7)
Neutrophils Relative %: 60.7 % (ref 43.0–77.0)
Platelets: 261 10*3/uL (ref 150.0–400.0)
RBC: 5.07 Mil/uL (ref 3.87–5.11)
RDW: 16.8 % — ABNORMAL HIGH (ref 11.5–15.5)
WBC: 5.8 10*3/uL (ref 4.0–10.5)

## 2020-02-22 LAB — VITAMIN B12: Vitamin B-12: 591 pg/mL (ref 211–911)

## 2020-02-22 LAB — TSH: TSH: 2.59 u[IU]/mL (ref 0.35–4.50)

## 2020-02-22 MED ORDER — OMEPRAZOLE 20 MG PO CPDR
20.0000 mg | DELAYED_RELEASE_CAPSULE | Freq: Every day | ORAL | 3 refills | Status: DC
Start: 2020-02-22 — End: 2021-07-09

## 2020-02-22 MED ORDER — CETIRIZINE HCL 10 MG PO TABS
10.0000 mg | ORAL_TABLET | Freq: Every day | ORAL | 3 refills | Status: DC
Start: 2020-02-22 — End: 2022-02-06

## 2020-02-22 MED ORDER — EPINEPHRINE 0.3 MG/0.3ML IJ SOAJ
INTRAMUSCULAR | 1 refills | Status: DC
Start: 2020-02-22 — End: 2022-02-06

## 2020-02-22 MED ORDER — MONTELUKAST SODIUM 10 MG PO TABS
ORAL_TABLET | ORAL | 3 refills | Status: DC
Start: 2020-02-22 — End: 2021-07-18

## 2020-02-22 MED ORDER — FAMOTIDINE 40 MG PO TABS
40.0000 mg | ORAL_TABLET | Freq: Every day | ORAL | 3 refills | Status: DC
Start: 2020-02-22 — End: 2021-07-09

## 2020-02-22 MED ORDER — GABAPENTIN 300 MG PO CAPS
300.0000 mg | ORAL_CAPSULE | Freq: Two times a day (BID) | ORAL | 0 refills | Status: DC
Start: 2020-02-22 — End: 2020-08-22

## 2020-02-22 NOTE — Assessment & Plan Note (Signed)
F/u w/Oncology 
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
## 2020-02-22 NOTE — Progress Notes (Signed)
Subjective:  Patient ID: [name], female    DOB: [birth year]  Age: 70 y.o. MRN: [id number]  CC: No chief complaint on file.   HPI [name] presents for allergies, asthma, GERD, HTN C/o reaction to a booster shot - resolved  Outpatient Medications Prior to Visit  Medication Sig Dispense Refill  . albuterol (PROVENTIL) (2.5 MG/3ML) 0.083% nebulizer solution Take 3 mLs (2.5 mg total) by nebulization every 4 (four) hours as needed for wheezing or shortness of breath. 225 mL 3  . albuterol (VENTOLIN HFA) 108 (90 Base) MCG/ACT inhaler INHALE TWO PUFFS BY MOUTH EVERY 6 HOURS AS NEEDED FOR  WHEEZING/SHORTNESS  OF  BREATH 24 g 1  . aspirin 81 MG tablet Take 81 mg by mouth daily.    . benzonatate (TESSALON) 100 MG capsule Take 1 capsule (100 mg total) by mouth 3 (three) times daily as needed for cough. 90 capsule 1  . budesonide-formoterol (SYMBICORT) 160-4.5 MCG/ACT inhaler Inhale 2 puffs into the lungs 2 (two) times daily. Rinse, gargle and spit out after use 1 Inhaler 5  . Cholecalciferol (VITAMIN D) 1000 UNITS capsule Take 1,000 Units by mouth every morning.    . Cyanocobalamin (VITAMIN B-12) 1000 MCG SUBL Place 1 tablet (1,000 mcg total) under the tongue daily. 100 tablet 3  . EPINEPHrine 0.3 mg/0.3 mL IJ SOAJ injection Use as directed for severe allergic reaction 2 Device 1  . ferrous sulfate 325 (65 FE) MG tablet Take 325 mg by mouth 3 (three) times daily with meals.     . fluticasone (FLONASE) 50 MCG/ACT nasal spray 1-2 sprays each nostril once daily (Patient taking differently: 2 sprays 2 (two) times daily. 1-2 sprays each nostril once daily) 48 g 3  . furosemide (LASIX) 40 MG tablet Take 1 tablet (40 mg total) by mouth daily as needed. 30 tablet 3  . gabapentin (NEURONTIN) 300 MG capsule Take 300 mg by mouth 2 (two) times daily.    . hydrochlorothiazide (HYDRODIURIL) 25 MG tablet Take 1 tablet by mouth once daily 90 tablet 3  . ipratropium (ATROVENT) 0.06 % nasal spray  Place 1 spray into both nostrils 4 (four) times daily. 45 mL 3  . irbesartan (AVAPRO) 150 MG tablet Take 1 tablet by mouth once daily 90 tablet 3  . levocetirizine (XYZAL) 5 MG tablet Take 1 tablet (5 mg total) by mouth every evening. 90 tablet 1  . methocarbamol (ROBAXIN) 500 MG tablet Take 1 tablet (500 mg total) by mouth every 8 (eight) hours as needed for muscle spasms. Muscle spasms 180 tablet 1  . potassium chloride (KLOR-CON) 10 MEQ tablet Take 1 tablet by mouth once daily 90 tablet 3  . cetirizine (ZYRTEC) 10 MG tablet TAKE 1 TABLET EVERY DAY 90 tablet 1  . famotidine (PEPCID) 40 MG tablet Take 1 tablet (40 mg total) by mouth daily. 90 tablet 3  . montelukast (SINGULAIR) 10 MG tablet TAKE 1 TABLET ONE TIME DAILY WITH BREAKFAST 90 tablet 3  . omeprazole (PRILOSEC) 20 MG capsule Take 1 capsule by mouth once daily 90 capsule 3   No facility-administered medications prior to visit.    ROS: Review of Systems  Constitutional: Negative for activity change, appetite change, chills, fatigue and unexpected weight change.  HENT: Negative for congestion, mouth sores and sinus pressure.   Eyes: Negative for visual disturbance.  Respiratory: Negative for cough and chest tightness.   Gastrointestinal: Negative for abdominal pain and nausea.  Genitourinary: Negative for difficulty urinating, frequency  and vaginal pain.  Musculoskeletal: Negative for back pain and gait problem.  Skin: Negative for pallor and rash.  Neurological: Negative for dizziness, tremors, weakness, numbness and headaches.  Psychiatric/Behavioral: Negative for confusion and sleep disturbance.    Objective:  There were no vitals taken for this visit.  BP Readings from Last 3 Encounters:  02/22/20 124/70  01/26/20 118/74  11/17/19 124/80    Wt Readings from Last 3 Encounters:  02/22/20 211 lb (95.7 kg)  01/26/20 211 lb 12.8 oz (96.1 kg)  11/17/19 209 lb 6.4 oz (95 kg)    Physical Exam Constitutional:       General: She is not in acute distress.    Appearance: She is well-developed. She is obese.  HENT:     Head: Normocephalic.     Right Ear: External ear normal.     Left Ear: External ear normal.     Nose: Nose normal.     Mouth/Throat:     Mouth: Oropharynx is clear and moist.  Eyes:     General:        Right eye: No discharge.        Left eye: No discharge.     Conjunctiva/sclera: Conjunctivae normal.     Pupils: Pupils are equal, round, and reactive to light.  Neck:     Thyroid: No thyromegaly.     Vascular: No JVD.     Trachea: No tracheal deviation.  Cardiovascular:     Rate and Rhythm: Normal rate and regular rhythm.     Heart sounds: Normal heart sounds.  Pulmonary:     Effort: No respiratory distress.     Breath sounds: No stridor. No wheezing.  Abdominal:     General: Bowel sounds are normal. There is no distension.     Palpations: Abdomen is soft. There is no mass.     Tenderness: There is no abdominal tenderness. There is no guarding or rebound.  Musculoskeletal:        General: No tenderness or edema.     Cervical back: Normal range of motion and neck supple.  Lymphadenopathy:     Cervical: No cervical adenopathy.  Skin:    Findings: No erythema or rash.  Neurological:     Cranial Nerves: No cranial nerve deficit.     Motor: No abnormal muscle tone.     Coordination: Coordination normal.     Deep Tendon Reflexes: Reflexes normal.  Psychiatric:        Mood and Affect: Mood and affect normal.        Behavior: Behavior normal.        Thought Content: Thought content normal.        Judgment: Judgment normal.     Lab Results  Component Value Date   WBC 5.8 11/17/2019   HGB 12.1 11/17/2019   HCT 36.8 11/17/2019   PLT 237.0 11/17/2019   GLUCOSE 99 11/17/2019   CHOL 170 11/10/2018   TRIG 65.0 11/10/2018   HDL 79.00 11/10/2018   LDLCALC 78 11/10/2018   ALT 15 11/17/2019   AST 15 11/17/2019   NA 139 11/17/2019   K 3.6 11/17/2019   CL 102 11/17/2019    CREATININE 0.79 11/17/2019   BUN 14 11/17/2019   CO2 27 11/17/2019   TSH 2.15 11/10/2018   INR 1.07 02/02/2013   HGBA1C 5.5 09/20/2013    CT CHEST WO CONTRAST  Result Date: 09/13/2019 CLINICAL DATA:  70 year old female with history of colorectal cancer. Surveillance scan. EXAM:  CT CHEST WITHOUT CONTRAST TECHNIQUE: Multidetector CT imaging of the chest was performed following the standard protocol without IV contrast. COMPARISON:  Chest CT 03/09/2018. FINDINGS: Cardiovascular: Heart size is normal. There is no significant pericardial fluid, thickening or pericardial calcification. No atherosclerotic calcifications in the thoracic aorta or the coronary arteries. Mediastinum/Nodes: No pathologically enlarged mediastinal or hilar lymph nodes. Please note that accurate exclusion of hilar adenopathy is limited on noncontrast CT scans. Small hiatal hernia. No axillary lymphadenopathy. Lungs/Pleura: No suspicious appearing pulmonary nodules or masses are noted. No acute consolidative airspace disease. No pleural effusions. Upper Abdomen: Status post cholecystectomy. Musculoskeletal: There are no aggressive appearing lytic or blastic lesions noted in the visualized portions of the skeleton. IMPRESSION: 1. No evidence to suggest metastatic disease to the chest. 2. Small hiatal hernia Electronically Signed   By: Vinnie Langton M.D.   On: 09/13/2019 14:43    Assessment & Plan:   There are no diagnoses linked to this encounter.   Meds ordered this encounter  Medications  . cetirizine (ZYRTEC) 10 MG tablet    Sig: Take 1 tablet (10 mg total) by mouth daily.    Dispense:  90 tablet    Refill:  3  . famotidine (PEPCID) 40 MG tablet    Sig: Take 1 tablet (40 mg total) by mouth daily.    Dispense:  90 tablet    Refill:  3  . montelukast (SINGULAIR) 10 MG tablet    Sig: TAKE 1 TABLET ONE TIME DAILY WITH BREAKFAST    Dispense:  90 tablet    Refill:  3  . omeprazole (PRILOSEC) 20 MG capsule    Sig:  Take 1 capsule (20 mg total) by mouth daily.    Dispense:  90 capsule    Refill:  3     Follow-up: No follow-ups on file.  Walker Kehr, MD

## 2020-02-22 NOTE — Assessment & Plan Note (Addendum)
Wt Readings from Last 3 Encounters:  02/22/20 211 lb (95.7 kg)  01/26/20 211 lb 12.8 oz (96.1 kg)  11/17/19 209 lb 6.4 oz (95 kg)   Stable wt

## 2020-02-22 NOTE — Assessment & Plan Note (Signed)
On B12 po CBC, B12 levels

## 2020-02-22 NOTE — Assessment & Plan Note (Signed)
Cont w/Diovan HCT, Furosemide prn, KCl

## 2020-02-22 NOTE — Patient Instructions (Signed)
Ms. Cieslewicz , Thank you for taking time to come for your Medicare Wellness Visit. I appreciate your ongoing commitment to your health goals. Please review the following plan we discussed and let me know if I can assist you in the future.   Screening recommendations/referrals: Colonoscopy: 11/26/2016; due every 5 years Mammogram: refused Bone Density: 05/17/2019; due every 2 years Recommended yearly ophthalmology/optometry visit for glaucoma screening and checkup Recommended yearly dental visit for hygiene and checkup  Vaccinations: Influenza vaccine: 11/17/19 Pneumococcal vaccine: up to date Tdap vaccine: 08/27/2011 Shingles vaccine: never done   Covid-19: up to date  Advanced directives: Advance directive discussed with you today. Even though you declined this today please call our office should you change your mind and we can give you the proper paperwork for you to fill out.  Conditions/risks identified: Yes; Reviewed health maintenance screenings with patient today and relevant education, vaccines, and/or referrals were provided. Please continue to do your personal lifestyle choices by: daily care of teeth and gums, regular physical activity (goal should be 5 days a week for 30 minutes), eat a healthy diet, avoid tobacco and drug use, limiting any alcohol intake, taking a low-dose aspirin (if not allergic or have been advised by your provider otherwise) and taking vitamins and minerals as recommended by your provider. Continue doing brain stimulating activities (puzzles, reading, adult coloring books, staying active) to keep memory sharp. Continue to eat heart healthy diet (full of fruits, vegetables, whole grains, lean protein, water--limit salt, fat, and sugar intake) and increase physical activity as tolerated.  Next appointment: Please schedule your next Medicare Wellness Visit with your Nurse Health Advisor in 1 year by calling [phone].   Preventive Care 55 Years and Older,  Female Preventive care refers to lifestyle choices and visits with your health care provider that can promote health and wellness. What does preventive care include?  A yearly physical exam. This is also called an annual well check.  Dental exams once or twice a year.  Routine eye exams. Ask your health care provider how often you should have your eyes checked.  Personal lifestyle choices, including:  Daily care of your teeth and gums.  Regular physical activity.  Eating a healthy diet.  Avoiding tobacco and drug use.  Limiting alcohol use.  Practicing safe sex.  Taking low-dose aspirin every day.  Taking vitamin and mineral supplements as recommended by your health care provider. What happens during an annual well check? The services and screenings done by your health care provider during your annual well check will depend on your age, overall health, lifestyle risk factors, and family history of disease. Counseling  Your health care provider may ask you questions about your:  Alcohol use.  Tobacco use.  Drug use.  Emotional well-being.  Home and relationship well-being.  Sexual activity.  Eating habits.  History of falls.  Memory and ability to understand (cognition).  Work and work Statistician.  Reproductive health. Screening  You may have the following tests or measurements:  Height, weight, and BMI.  Blood pressure.  Lipid and cholesterol levels. These may be checked every 5 years, or more frequently if you are over 73 years old.  Skin check.  Lung cancer screening. You may have this screening every year starting at age 29 if you have a 30-pack-year history of smoking and currently smoke or have quit within the past 15 years.  Fecal occult blood test (FOBT) of the stool. You may have this test every year starting at  age 79.  Flexible sigmoidoscopy or colonoscopy. You may have a sigmoidoscopy every 5 years or a colonoscopy every 10 years  starting at age 70.  Hepatitis C blood test.  Hepatitis B blood test.  Sexually transmitted disease (STD) testing.  Diabetes screening. This is done by checking your blood sugar (glucose) after you have not eaten for a while (fasting). You may have this done every 1-3 years.  Bone density scan. This is done to screen for osteoporosis. You may have this done starting at age 70.  Mammogram. This may be done every 1-2 years. Talk to your health care provider about how often you should have regular mammograms. Talk with your health care provider about your test results, treatment options, and if necessary, the need for more tests. Vaccines  Your health care provider may recommend certain vaccines, such as:  Influenza vaccine. This is recommended every year.  Tetanus, diphtheria, and acellular pertussis (Tdap, Td) vaccine. You may need a Td booster every 10 years.  Zoster vaccine. You may need this after age 52.  Pneumococcal 13-valent conjugate (PCV13) vaccine. One dose is recommended after age 30.  Pneumococcal polysaccharide (PPSV23) vaccine. One dose is recommended after age 59. Talk to your health care provider about which screenings and vaccines you need and how often you need them. This information is not intended to replace advice given to you by your health care provider. Make sure you discuss any questions you have with your health care provider. Document Released: 02/10/2015 Document Revised: 10/04/2015 Document Reviewed: 11/15/2014 Elsevier Interactive Patient Education  2017 [place] Prevention in the Home Falls can cause injuries. They can happen to people of all ages. There are many things you can do to make your home safe and to help prevent falls. What can I do on the outside of my home?  Regularly fix the edges of walkways and driveways and fix any cracks.  Remove anything that might make you trip as you walk through a door, such as a raised step or  threshold.  Trim any bushes or trees on the path to your home.  Use bright outdoor lighting.  Clear any walking paths of anything that might make someone trip, such as rocks or tools.  Regularly check to see if handrails are loose or broken. Make sure that both sides of any steps have handrails.  Any raised decks and porches should have guardrails on the edges.  Have any leaves, snow, or ice cleared regularly.  Use sand or salt on walking paths during winter.  Clean up any spills in your garage right away. This includes oil or grease spills. What can I do in the bathroom?  Use night lights.  Install grab bars by the toilet and in the tub and shower. Do not use towel bars as grab bars.  Use non-skid mats or decals in the tub or shower.  If you need to sit down in the shower, use a plastic, non-slip stool.  Keep the floor dry. Clean up any water that spills on the floor as soon as it happens.  Remove soap buildup in the tub or shower regularly.  Attach bath mats securely with double-sided non-slip rug tape.  Do not have throw rugs and other things on the floor that can make you trip. What can I do in the bedroom?  Use night lights.  Make sure that you have a light by your bed that is easy to reach.  Do not use any  sheets or blankets that are too big for your bed. They should not hang down onto the floor.  Have a firm chair that has side arms. You can use this for support while you get dressed.  Do not have throw rugs and other things on the floor that can make you trip. What can I do in the kitchen?  Clean up any spills right away.  Avoid walking on wet floors.  Keep items that you use a lot in easy-to-reach places.  If you need to reach something above you, use a strong step stool that has a grab bar.  Keep electrical cords out of the way.  Do not use floor polish or wax that makes floors slippery. If you must use wax, use non-skid floor wax.  Do not have  throw rugs and other things on the floor that can make you trip. What can I do with my stairs?  Do not leave any items on the stairs.  Make sure that there are handrails on both sides of the stairs and use them. Fix handrails that are broken or loose. Make sure that handrails are as long as the stairways.  Check any carpeting to make sure that it is firmly attached to the stairs. Fix any carpet that is loose or worn.  Avoid having throw rugs at the top or bottom of the stairs. If you do have throw rugs, attach them to the floor with carpet tape.  Make sure that you have a light switch at the top of the stairs and the bottom of the stairs. If you do not have them, ask someone to add them for you. What else can I do to help prevent falls?  Wear shoes that:  Do not have high heels.  Have rubber bottoms.  Are comfortable and fit you well.  Are closed at the toe. Do not wear sandals.  If you use a stepladder:  Make sure that it is fully opened. Do not climb a closed stepladder.  Make sure that both sides of the stepladder are locked into place.  Ask someone to hold it for you, if possible.  Clearly mark and make sure that you can see:  Any grab bars or handrails.  First and last steps.  Where the edge of each step is.  Use tools that help you move around (mobility aids) if they are needed. These include:  Canes.  Walkers.  Scooters.  Crutches.  Turn on the lights when you go into a dark area. Replace any light bulbs as soon as they burn out.  Set up your furniture so you have a clear path. Avoid moving your furniture around.  If any of your floors are uneven, fix them.  If there are any pets around you, be aware of where they are.  Review your medicines with your doctor. Some medicines can make you feel dizzy. This can increase your chance of falling. Ask your doctor what other things that you can do to help prevent falls. This information is not intended to  replace advice given to you by your health care provider. Make sure you discuss any questions you have with your health care provider. Document Released: 11/10/2008 Document Revised: 06/22/2015 Document Reviewed: 02/18/2014 Elsevier Interactive Patient Education  2017 [employer].

## 2020-03-02 ENCOUNTER — Telehealth: Payer: Self-pay | Admitting: Internal Medicine

## 2020-03-02 NOTE — Telephone Encounter (Signed)
[facility] called and is needing clarification on EPINEPHrine 0.3 mg/0.3 mL IJ SOAJ injection.    Phone: [phone]

## 2020-03-03 ENCOUNTER — Telehealth: Payer: Self-pay | Admitting: Internal Medicine

## 2020-03-03 NOTE — Telephone Encounter (Signed)
EPINEPHrine 0.3 mg/0.3 mL IJ SOAJ injection [employer] calling, states it was sent in for a specific brand but they do not have it.  JWL#[id number]

## 2020-03-03 NOTE — Telephone Encounter (Signed)
Called [employer] spoke w/tech she states there was a note to pharmacy to dispense Mylan, and wanted to inform provider there wasn't a guarantee that they have that [name]. Inform tech we spoke w/someone on yesterday and ok which ever Rashida Ladouceur that you have. Which was the [employer] pharmaceutical../lmb

## 2020-03-03 NOTE — Telephone Encounter (Signed)
Called [employer] back spoke w/tech she states they received the epinephrine rx but there is no guarantee that they will get the Mylan Lamine Laton. She states they used Estate agent. Just need to inform provider.Marland KitchenJohny Chess

## 2020-03-13 ENCOUNTER — Inpatient Hospital Stay: Payer: Medicare PPO

## 2020-03-13 ENCOUNTER — Encounter: Payer: Self-pay | Admitting: Nurse Practitioner

## 2020-03-13 ENCOUNTER — Inpatient Hospital Stay: Payer: Medicare PPO | Attending: Nurse Practitioner | Admitting: Nurse Practitioner

## 2020-03-13 ENCOUNTER — Other Ambulatory Visit: Payer: Self-pay

## 2020-03-13 ENCOUNTER — Telehealth: Payer: Self-pay | Admitting: Oncology

## 2020-03-13 ENCOUNTER — Ambulatory Visit (INDEPENDENT_AMBULATORY_CARE_PROVIDER_SITE_OTHER): Payer: Medicare PPO | Admitting: *Deleted

## 2020-03-13 VITALS — BP 131/78 | HR 74 | Temp 97.8°F | Resp 18 | Ht 62.0 in | Wt 212.7 lb

## 2020-03-13 DIAGNOSIS — Z9221 Personal history of antineoplastic chemotherapy: Secondary | ICD-10-CM | POA: Insufficient documentation

## 2020-03-13 DIAGNOSIS — R911 Solitary pulmonary nodule: Secondary | ICD-10-CM | POA: Diagnosis not present

## 2020-03-13 DIAGNOSIS — C184 Malignant neoplasm of transverse colon: Secondary | ICD-10-CM

## 2020-03-13 DIAGNOSIS — Z85038 Personal history of other malignant neoplasm of large intestine: Secondary | ICD-10-CM | POA: Insufficient documentation

## 2020-03-13 DIAGNOSIS — J45909 Unspecified asthma, uncomplicated: Secondary | ICD-10-CM | POA: Diagnosis not present

## 2020-03-13 DIAGNOSIS — D649 Anemia, unspecified: Secondary | ICD-10-CM | POA: Insufficient documentation

## 2020-03-13 DIAGNOSIS — J309 Allergic rhinitis, unspecified: Secondary | ICD-10-CM

## 2020-03-13 LAB — CEA (IN HOUSE-CHCC): CEA (CHCC-In House): 1.53 ng/mL (ref 0.00–5.00)

## 2020-03-13 NOTE — Progress Notes (Signed)
[facility] OFFICE PROGRESS NOTE   Diagnosis: Colon cancer  INTERVAL HISTORY:   [name] returns as scheduled.  She is feeling well.  No change in bowel habits.  No blood with bowel movements.  No bleeding with bowel movements.  She has intermittent allergy/asthma symptoms.  No fever or shortness of breath.  Objective:  Vital signs in last 24 hours:  Blood pressure 131/78, pulse 74, temperature 97.8 F (36.6 C), temperature source Tympanic, resp. rate 18, height _0  (1.575 m), weight 212 lb 11.2 oz (96.5 kg), SpO2 98 %.    HEENT: Neck without mass. Lymphatics: No palpable cervical, supraclavicular, axillary or inguinal lymph nodes. Resp: Lungs clear bilaterally. Cardio: Regular rate and rhythm. GI: Abdomen soft and nontender.  No hepatomegaly.  No mass.  Persistent soft rounded fullness inferior to the medial cholecystectomy scar right abdomen, question hernia, question lipoma Vascular: Trace edema at the lower leg bilaterally.    Lab Results:  Lab Results  Component Value Date   WBC 5.8 02/22/2020   HGB 11.8 (L) 02/22/2020   HCT 37.0 02/22/2020   MCV 72.9 (L) 02/22/2020   PLT 261.0 02/22/2020   NEUTROABS 3.5 02/22/2020    Imaging:  No results found.  Medications: I have reviewed the patient's current medications.  Assessment/Plan: 1. 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   Presentation to the emergency room 02/02/2013 with a colonic obstruction secondary to tumor at the splenic flexure.   Baseline CEA on 02/02/2013 less than 0.5.   Initiation of adjuvant Xeloda 04/10/2013.   Cycle 2 adjuvant Xeloda 05/01/2013.   Cycle 3 adjuvant Xeloda 05/22/2013.   Cycle 4  adjuvant Xeloda 06/12/2013.   Cycle 5 adjuvant Xeloda 07/03/2013 (Xeloda dose reduced due to hand foot syndrome).   Cycle 6 adjuvant Xeloda 07/24/2013.   Cycle 7 adjuvant Xeloda 08/14/2013   Cycle 8 adjuvant Xeloda 09/07/2013.  Surveillance colonoscopy 11/26/2016-patentend toside colocolonic anastomosis, characterized by healthy-appearing mucosa. Examination otherwise normal. Repeat colonoscopy in 5 years for surveillance.   2. History of iron deficiency anemia. Recurrent anemia 02/01/2014, improved, Persistent red cell microcytosis 3. Asthma. 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 5. Status post ostomy reversal 11/25/2013. 6. CT 02/01/2014 with no evidence of local tumor recurrence or metastatic disease. New area of masslike thickening and small bowel dilatation at the mid small bowel  Status post deep enteroscopy at [facility] on 03/16/2014, negative.  Status post capsule endoscopy 03/22/2014, confirmed a small bowel tumor  Exploratory laparotomy with resection of a small bowel mass on 04/06/2014 with the pathology confirming invasive adenocarcinoma extending through small bowel wall and involving adjacent loops of adherent small bowel, 2 of 12 lymph nodes positive. History consistent with recurrent colon cancer.Loss of MLH1 and PMS 2, MSI high as was the January 2015 tumor  CT abdomen/pelvis 02/03/2015-no evidence of recurrent colon cancer, ventral hernias  CT chest/abdomen/pelvis 03/09/2018-no evidence of recurrent or metastatic disease. Nonspecific 2 mm posterior right upper lobe nodule. Continued attention on follow-up exams suggested.  CT chest 09/13/2019-no evidence of metastatic disease     Disposition: Ms. [name] remains in clinical remission from colon cancer.  We will follow-up on the CEA from today.  Plan for surveillance CT scans in 6 months.  We will see her in follow-up a few  days later to review the results.    Ned Card ANP/GNP-BC   03/13/2020   11:59 AM

## 2020-03-13 NOTE — Telephone Encounter (Signed)
Scheduled per 02/14 los, patient received updated calender.

## 2020-03-20 ENCOUNTER — Telehealth: Payer: Self-pay

## 2020-03-20 NOTE — Telephone Encounter (Signed)
Patient would like to be worked in to see Dr. Marlou Sa for right ankle.  Patient hasn't been seen in some years, but stated that Dr. Marlou Sa did her right ankle surgery and now she is having a lot of pain in the right ankle.  Cb# [phone].  Please advise.  Thank you

## 2020-03-21 NOTE — Telephone Encounter (Signed)
scheduled

## 2020-03-22 ENCOUNTER — Ambulatory Visit: Payer: Medicare PPO | Admitting: Physician Assistant

## 2020-03-22 ENCOUNTER — Ambulatory Visit (INDEPENDENT_AMBULATORY_CARE_PROVIDER_SITE_OTHER): Payer: Medicare PPO

## 2020-03-22 ENCOUNTER — Other Ambulatory Visit: Payer: Self-pay

## 2020-03-22 ENCOUNTER — Ambulatory Visit: Payer: Medicare PPO | Admitting: Orthopedic Surgery

## 2020-03-22 DIAGNOSIS — M25571 Pain in right ankle and joints of right foot: Secondary | ICD-10-CM

## 2020-03-22 MED ORDER — MELOXICAM 15 MG PO TABS: 15.0000 mg | ORAL_TABLET | Freq: Every day | ORAL | 0 refills | Status: DC

## 2020-03-22 MED ORDER — TRAMADOL HCL 50 MG PO TABS: 50.0000 mg | ORAL_TABLET | Freq: Two times a day (BID) | ORAL | 0 refills | Status: DC | PRN

## 2020-03-29 ENCOUNTER — Encounter: Payer: Self-pay | Admitting: Orthopedic Surgery
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
## 2020-04-03 ENCOUNTER — Telehealth: Payer: Self-pay | Admitting: Orthopedic Surgery

## 2020-04-03 ENCOUNTER — Ambulatory Visit (INDEPENDENT_AMBULATORY_CARE_PROVIDER_SITE_OTHER): Payer: Medicare PPO | Admitting: *Deleted

## 2020-04-03 DIAGNOSIS — J309 Allergic rhinitis, unspecified: Secondary | ICD-10-CM

## 2020-04-03 NOTE — Telephone Encounter (Signed)
Alyssa the pharmacist from [facility] in [place] called and states pt called them for a refill on tramadol but she hasn't received a prescription. Wondering if you could send her one in?

## 2020-04-04 ENCOUNTER — Other Ambulatory Visit: Payer: Self-pay | Admitting: Surgical

## 2020-04-04 MED ORDER — TRAMADOL HCL 50 MG PO TABS: 50.0000 mg | ORAL_TABLET | Freq: Two times a day (BID) | ORAL | 0 refills | Status: DC | PRN

## 2020-04-04 NOTE — Telephone Encounter (Signed)
See below

## 2020-04-04 NOTE — Telephone Encounter (Signed)
Sent in, this is only refill prior to her follow-up appointment.  Thjanks!

## 2020-04-05 ENCOUNTER — Telehealth: Payer: Self-pay

## 2020-04-05 NOTE — Telephone Encounter (Signed)
Can you please dictate a note?

## 2020-04-05 NOTE — Telephone Encounter (Signed)
Patient called regarding rx that was sent to [facility] for inserts for her shoes she stated [employer] told her she would have to pay out of pocket she stated her insurance told her they would cover 80% and leave her to cover the rest she is requesting a note to be sent to [facility] stating the inserts are medical necessity so she can receive them patient would like a call back. [phone] Call back:[phone] Patient call [phone]

## 2020-04-06 ENCOUNTER — Telehealth: Payer: Self-pay | Admitting: Orthopedic Surgery

## 2020-04-06 NOTE — Telephone Encounter (Signed)
Working on this. See other note. Will contact patient.

## 2020-04-06 NOTE — Telephone Encounter (Signed)
Patient called requesting that we contact her insurance company with approval from the Dr. Marlou Sa for the insurance company to cover the foot inserts needed. Please call patient about this matter and her insurance company for doctor's approval. Patient phone number is [phone].

## 2020-04-06 NOTE — Telephone Encounter (Signed)
Note written per Dr Forbes Cellar note. Faxed to number provided below. Will call patient to advise done.

## 2020-04-06 NOTE — Telephone Encounter (Signed)
[name] is a patient with bilateral posterior tib tendon deficiency.  She has foot deformity and is not a great candidate for surgery.  It is a medical necessity that she have inserts made for her foot deformity to prevent further deformity and delayed need for surgery.  It is a medical necessity that she have these inserts.  Please call if there are any questions.

## 2020-04-07 ENCOUNTER — Telehealth: Payer: Self-pay | Admitting: Orthopedic Surgery

## 2020-04-07 NOTE — Telephone Encounter (Signed)
IC advised we had discussed this morning.

## 2020-04-07 NOTE — Telephone Encounter (Signed)
IC advised done 

## 2020-04-07 NOTE — Telephone Encounter (Signed)
Patient called asking for an updated concerning insurance and foot inserts

## 2020-04-10 ENCOUNTER — Telehealth: Payer: Self-pay

## 2020-04-10 NOTE — Telephone Encounter (Signed)
Patient called regarding inserts for her shoes she stated you guys were supposed to fax over a note she needs note refaxed due to her insurance not receiving anything call back:[phone]

## 2020-04-11 NOTE — Telephone Encounter (Signed)
Refaxed. Patient advised done.

## 2020-04-12 ENCOUNTER — Ambulatory Visit (INDEPENDENT_AMBULATORY_CARE_PROVIDER_SITE_OTHER): Payer: Medicare PPO | Admitting: Orthopedic Surgery

## 2020-04-12 ENCOUNTER — Telehealth: Payer: Self-pay | Admitting: Orthopedic Surgery

## 2020-04-12 DIAGNOSIS — M25571 Pain in right ankle and joints of right foot: Secondary | ICD-10-CM

## 2020-04-12 NOTE — Telephone Encounter (Signed)
Patient  Called advised she was given the wrong fax number. The correct fax# is [phone]   Patient asked if the fax can be resent? The number to contact patient is [phone]

## 2020-04-12 NOTE — Telephone Encounter (Signed)
Refaxed to number provided.

## 2020-04-13 ENCOUNTER — Encounter: Payer: Self-pay | Admitting: Orthopedic Surgery
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
## 2020-04-19 ENCOUNTER — Telehealth: Payer: Self-pay | Admitting: Orthopedic Surgery

## 2020-04-19 MED ORDER — MELOXICAM 15 MG PO TABS: 15.0000 mg | ORAL_TABLET | Freq: Every day | ORAL | 0 refills | Status: DC

## 2020-04-19 NOTE — Addendum Note (Signed)
Addended byLaurann Montana on: 04/19/2020 03:59 PM   Modules accepted: Orders

## 2020-04-19 NOTE — Telephone Encounter (Signed)
Pt calling and requesting refill on meloxicam because her foot is still swollen.

## 2020-04-19 NOTE — Telephone Encounter (Signed)
Rxrf submitted per Dr Dean.  

## 2020-04-24 ENCOUNTER — Telehealth: Payer: Self-pay | Admitting: Orthopedic Surgery

## 2020-04-24 ENCOUNTER — Ambulatory Visit (INDEPENDENT_AMBULATORY_CARE_PROVIDER_SITE_OTHER): Payer: Medicare PPO

## 2020-04-24 DIAGNOSIS — J309 Allergic rhinitis, unspecified: Secondary | ICD-10-CM

## 2020-04-24 NOTE — Telephone Encounter (Signed)
Pt called stating [facility] is telling her they can't fill her rx for her inserts and she states when she called her insurance they said they do; so pt states she feels like she's going in circles. Pt would like to know if there's anyone else that can fill the rx; pt would like a CB with an update.   [phone]

## 2020-04-25 ENCOUNTER — Telehealth: Payer: Self-pay | Admitting: Orthopedic Surgery

## 2020-04-25 NOTE — Telephone Encounter (Signed)
I am going to text you a picture of an ankle brace for you to have Dr. Marlou Sa review and see if this is something he would want for this pt. Insurance will not cover the more conservative insert as she does not have diabetes. If she wants to pay out of pocket it would cost roughly 300$ or he could order the ankle brace ( I will text the picture) and insurance would cover this.

## 2020-04-25 NOTE — Telephone Encounter (Signed)
Can you please help me with this? Do you know of any other places than [facility] where patient can be fitted for shoe inserts?

## 2020-04-25 NOTE — Telephone Encounter (Signed)
Holding to speak with Dr Marlou Sa

## 2020-04-25 NOTE — Telephone Encounter (Signed)
I have called Gerald Stabs with [employer] [facility]. He will call back to discuss will hold this message pending return call.

## 2020-04-25 NOTE — Telephone Encounter (Signed)
PT called and says that her insurance told her that they would pay 80% and she would only have to pay 20% for the inserts. She states that her insurance wants Dr.Dean to call them at [phone].

## 2020-04-26 NOTE — Telephone Encounter (Signed)
Spoke with Dr Marlou Sa. Tried calling patient to discuss.

## 2020-04-27 NOTE — Telephone Encounter (Signed)
Pt called today returning lauren's call

## 2020-05-03 ENCOUNTER — Telehealth: Payer: Self-pay | Admitting: Orthopedic Surgery

## 2020-05-03 NOTE — Telephone Encounter (Signed)
Will call patient to discuss

## 2020-05-03 NOTE — Telephone Encounter (Signed)
Patient called asked if she can get a call back concerning the inserts she need for her shoes. Patient asked what company will cover the inserts?. Patient said [employer] said [employer] will not cover the inserts and she would have to pay OOP. [employer] said the inserts would cost patient $289.00. Marland Kitchen The number to contact patient is [phone]

## 2020-05-03 NOTE — Telephone Encounter (Signed)
I do not have any more recommendations but Dr. Sharol Given may be able to give her his $0.02 worth since she may be heading to surgery anyway

## 2020-05-03 NOTE — Telephone Encounter (Signed)
See below. IC patient. She stated that she is having a really hard time with her feet.  She said that the inserts is what [employer] recommended rather than her having to take braces on and off because she has a hard time doing this. She would like to know if you have any other recommendations.

## 2020-05-04 NOTE — Telephone Encounter (Signed)
Called patient no answer.

## 2020-05-15 ENCOUNTER — Other Ambulatory Visit: Payer: Self-pay | Admitting: Surgical

## 2020-05-18 ENCOUNTER — Telehealth: Payer: Self-pay | Admitting: Orthopedic Surgery

## 2020-05-18 ENCOUNTER — Ambulatory Visit (INDEPENDENT_AMBULATORY_CARE_PROVIDER_SITE_OTHER): Payer: Medicare PPO | Admitting: *Deleted

## 2020-05-18 DIAGNOSIS — J309 Allergic rhinitis, unspecified: Secondary | ICD-10-CM | POA: Diagnosis not present

## 2020-05-18 NOTE — Telephone Encounter (Signed)
Please advise 

## 2020-05-18 NOTE — Telephone Encounter (Signed)
Patient called requesting a refill of tramadol. Please send to pharmacy on file. Patient also asking for a call back from [employer]. Patient phone number is [phone].

## 2020-05-22 ENCOUNTER — Ambulatory Visit (INDEPENDENT_AMBULATORY_CARE_PROVIDER_SITE_OTHER): Payer: Medicare PPO | Admitting: *Deleted

## 2020-05-22 DIAGNOSIS — J309 Allergic rhinitis, unspecified: Secondary | ICD-10-CM | POA: Diagnosis not present

## 2020-05-24 ENCOUNTER — Telehealth: Payer: Self-pay | Admitting: Orthopedic Surgery

## 2020-05-24 NOTE — Telephone Encounter (Signed)
See below. Prior message sent to you on 04/22

## 2020-05-24 NOTE — Telephone Encounter (Signed)
Sent in

## 2020-05-24 NOTE — Telephone Encounter (Signed)
Patient called for an update about refill of tramadol. Please call patient at [phone].

## 2020-05-29 ENCOUNTER — Ambulatory Visit (INDEPENDENT_AMBULATORY_CARE_PROVIDER_SITE_OTHER): Payer: Medicare PPO

## 2020-05-29 DIAGNOSIS — J309 Allergic rhinitis, unspecified: Secondary | ICD-10-CM | POA: Diagnosis not present

## 2020-06-07 ENCOUNTER — Ambulatory Visit (INDEPENDENT_AMBULATORY_CARE_PROVIDER_SITE_OTHER): Payer: Medicare PPO

## 2020-06-07 ENCOUNTER — Other Ambulatory Visit: Payer: Medicare PPO

## 2020-06-07 ENCOUNTER — Ambulatory Visit: Payer: Medicare PPO | Admitting: Orthopedic Surgery

## 2020-06-07 ENCOUNTER — Telehealth: Payer: Self-pay | Admitting: Orthopedic Surgery

## 2020-06-07 DIAGNOSIS — J309 Allergic rhinitis, unspecified: Secondary | ICD-10-CM | POA: Diagnosis not present

## 2020-06-07 DIAGNOSIS — M25571 Pain in right ankle and joints of right foot: Secondary | ICD-10-CM

## 2020-06-07 MED ORDER — TRAMADOL HCL 50 MG PO TABS: 50.0000 mg | ORAL_TABLET | Freq: Every day | ORAL | 0 refills | Status: DC | PRN

## 2020-06-07 NOTE — Telephone Encounter (Signed)
IC advised done 

## 2020-06-07 NOTE — Telephone Encounter (Signed)
IC LMVM advising submitted °

## 2020-06-07 NOTE — Telephone Encounter (Signed)
Pt called stating she had an appt wit Dr Marlou Sa and he was supposed to call her something in for her pain but her pharmacy still hasn't received anything. Pt would like to have her rx sent in and a CB when that's been done please.   [phone]

## 2020-06-10 ENCOUNTER — Encounter: Payer: Self-pay | Admitting: Orthopedic Surgery
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
## 2020-06-13 ENCOUNTER — Ambulatory Visit
Admission: RE | Admit: 2020-06-13 | Discharge: 2020-06-13 | Disposition: A | Discharge status: Home / Self Care | Payer: Medicare PPO | Source: Ambulatory Visit | Attending: Orthopedic Surgery | Admitting: Orthopedic Surgery

## 2020-06-13 ENCOUNTER — Ambulatory Visit (INDEPENDENT_AMBULATORY_CARE_PROVIDER_SITE_OTHER): Payer: Medicare PPO | Admitting: *Deleted

## 2020-06-13 DIAGNOSIS — J309 Allergic rhinitis, unspecified: Secondary | ICD-10-CM

## 2020-06-13 DIAGNOSIS — R600 Localized edema: Secondary | ICD-10-CM | POA: Diagnosis not present

## 2020-06-13 DIAGNOSIS — M778 Other enthesopathies, not elsewhere classified: Secondary | ICD-10-CM | POA: Diagnosis not present

## 2020-06-13 DIAGNOSIS — M1288 Other specific arthropathies, not elsewhere classified, other specified site: Secondary | ICD-10-CM | POA: Diagnosis not present

## 2020-06-13 DIAGNOSIS — M25571 Pain in right ankle and joints of right foot: Secondary | ICD-10-CM

## 2020-06-13 DIAGNOSIS — M19071 Primary osteoarthritis, right ankle and foot: Secondary | ICD-10-CM | POA: Diagnosis not present

## 2020-06-27 ENCOUNTER — Other Ambulatory Visit: Payer: Medicare PPO

## 2020-06-29 ENCOUNTER — Other Ambulatory Visit: Payer: Self-pay

## 2020-06-29 ENCOUNTER — Ambulatory Visit (INDEPENDENT_AMBULATORY_CARE_PROVIDER_SITE_OTHER): Payer: Medicare PPO | Admitting: Orthopedic Surgery

## 2020-06-29 DIAGNOSIS — M25571 Pain in right ankle and joints of right foot: Secondary | ICD-10-CM | POA: Diagnosis not present

## 2020-06-29 DIAGNOSIS — M76821 Posterior tibial tendinitis, right leg: Secondary | ICD-10-CM

## 2020-07-09 ENCOUNTER — Encounter: Payer: Self-pay | Admitting: Orthopedic Surgery
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
## 2020-07-10 ENCOUNTER — Ambulatory Visit (INDEPENDENT_AMBULATORY_CARE_PROVIDER_SITE_OTHER): Payer: Medicare PPO

## 2020-07-10 DIAGNOSIS — J309 Allergic rhinitis, unspecified: Secondary | ICD-10-CM | POA: Diagnosis not present

## 2020-07-27 ENCOUNTER — Encounter: Payer: Self-pay | Admitting: Orthopedic Surgery

## 2020-07-27 ENCOUNTER — Ambulatory Visit (INDEPENDENT_AMBULATORY_CARE_PROVIDER_SITE_OTHER): Payer: Medicare PPO | Admitting: Orthopedic Surgery

## 2020-07-27 DIAGNOSIS — M76821 Posterior tibial tendinitis, right leg: Secondary | ICD-10-CM | POA: Diagnosis not present

## 2020-07-27 MED ORDER — TRAMADOL HCL 50 MG PO TABS: 50.0000 mg | ORAL_TABLET | Freq: Every day | ORAL | 0 refills | Status: DC | PRN
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
## 2020-08-04 ENCOUNTER — Ambulatory Visit (INDEPENDENT_AMBULATORY_CARE_PROVIDER_SITE_OTHER): Payer: Medicare PPO | Admitting: Allergy & Immunology

## 2020-08-04 ENCOUNTER — Other Ambulatory Visit: Payer: Self-pay

## 2020-08-04 ENCOUNTER — Encounter: Payer: Self-pay | Admitting: Allergy & Immunology

## 2020-08-04 VITALS — BP 110/62 | HR 60 | Temp 98.0°F | Resp 16 | Ht 62.0 in | Wt 208.8 lb

## 2020-08-04 DIAGNOSIS — R053 Chronic cough: Secondary | ICD-10-CM

## 2020-08-04 DIAGNOSIS — J302 Other seasonal allergic rhinitis: Secondary | ICD-10-CM

## 2020-08-04 DIAGNOSIS — J4540 Moderate persistent asthma, uncomplicated: Secondary | ICD-10-CM

## 2020-08-04 DIAGNOSIS — J3089 Other allergic rhinitis: Secondary | ICD-10-CM

## 2020-08-04 NOTE — Patient Instructions (Addendum)
1. Seasonal and perennial allergic rhinitis (trees, weeds, grasses, molds, dust mites, cat, dog and cockroach) - Continue allergy shots at the same schedule.  - Continue with the daily use of the nose sprays. - Continue with Xyzal (levocetirizine) 5mg  tablet once daily - Continue saline nasal rinses - Continue with Tessalon perles as needed.   2. Moderate persistent asthma, uncomplicated - Lung testing looked beautiful today. - We are not going to make any medication changes at this time.  - We will continue you on Symbicort 160-2 puffs twice a day with a spacer.  - Daily controller medication(s): Symbicort 160/4.95mcg two puffs twice daily with spacer + montelukast 10 mg once a day - Prior to physical activity: albuterol 2 puffs 10-15 minutes before physical activity. - Rescue medications: albuterol 4 puffs every 4-6 hours as needed - Asthma control goals:  * Full participation in all desired activities (may need albuterol before activity) * Albuterol use two time or less a week on average (not counting use with activity) * Cough interfering with sleep two time or less a month * Oral steroids no more than once a year * No hospitalization  3. GERD - Continue avoidance of caffeine, chocolate, and peppermint intake - Continue with omeprazole in the morning. - Continue famotidine 40 mg at night.  3. Return in about 6 months (around 02/04/2021).    Please inform us of any Emergency Department visits, hospitalizations, or changes in symptoms. Call us before going to the ED for breathing or allergy symptoms since we might be able to fit you in for a sick visit. Feel free to contact us anytime with any questions, problems, or concerns.  It was a pleasure to see you again today!  Websites that have reliable patient information: 1. American Academy of Asthma, Allergy, and Immunology: www.aaaai.org 2. Food Allergy Research and Education (FARE): foodallergy.org 3. Mothers of Asthmatics:  http://www.asthmacommunitynetwork.org 4. American College of Allergy, Asthma, and Immunology: www.acaai.org   COVID-19 Vaccine Information can be found at: ShippingScam.co.uk For questions related to vaccine distribution or appointments, please email vaccine@[facility] .com or call [phone].   We realize that you might be concerned about having an allergic reaction to the COVID19 vaccines. To help with that concern, WE ARE OFFERING THE COVID19 VACCINES IN OUR OFFICE! Ask the front desk for dates!     "Like" [place] on Facebook and Instagram for our latest updates!      A healthy democracy works best when [employer] participate! Make sure you are registered to vote! If you have moved or changed any of your contact information, you will need to get this updated before voting!  In some cases, you MAY be able to register to vote online: [email or web address]
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
## 2020-08-06 ENCOUNTER — Encounter: Payer: Self-pay | Admitting: Allergy & Immunology

## 2020-08-06 MED ORDER — ALBUTEROL SULFATE HFA 108 (90 BASE) MCG/ACT IN AERS: INHALATION_SPRAY | RESPIRATORY_TRACT | 1 refills | Status: DC

## 2020-08-06 MED ORDER — BUDESONIDE-FORMOTEROL FUMARATE 160-4.5 MCG/ACT IN AERO: 2.0000 | INHALATION_SPRAY | Freq: Two times a day (BID) | RESPIRATORY_TRACT | 5 refills | Status: DC

## 2020-08-07 ENCOUNTER — Ambulatory Visit (INDEPENDENT_AMBULATORY_CARE_PROVIDER_SITE_OTHER): Payer: Medicare PPO

## 2020-08-07 DIAGNOSIS — J309 Allergic rhinitis, unspecified: Secondary | ICD-10-CM | POA: Diagnosis not present

## 2020-08-10 ENCOUNTER — Telehealth: Payer: Self-pay | Admitting: Orthopedic Surgery

## 2020-08-10 NOTE — Telephone Encounter (Signed)
Pt calling for Malachy Mood wanting to verify what day her surgery appt date with Dr. Sharol Given is. Pt thinks surgery appt is for the August 17th or August 3rd and wanted to check. The best call back number is [phone].

## 2020-08-14 ENCOUNTER — Telehealth: Payer: Self-pay | Admitting: Internal Medicine

## 2020-08-14 NOTE — Telephone Encounter (Signed)
Ok televisit 

## 2020-08-14 NOTE — Telephone Encounter (Signed)
Please advise if ok to make this a televisit.Thanks

## 2020-08-17 ENCOUNTER — Other Ambulatory Visit: Payer: Self-pay

## 2020-08-17 NOTE — Progress Notes (Signed)
Prior

## 2020-08-17 NOTE — Telephone Encounter (Signed)
Called and spoke to pt. Informed her of the change in appt status. Appt note changed to 'televisit'. Pt verbalized understanding and denied any further questions or concerns at this time.

## 2020-08-22 ENCOUNTER — Encounter: Payer: Self-pay | Admitting: Internal Medicine

## 2020-08-22 ENCOUNTER — Ambulatory Visit (INDEPENDENT_AMBULATORY_CARE_PROVIDER_SITE_OTHER): Payer: Medicare PPO | Admitting: Internal Medicine

## 2020-08-22 ENCOUNTER — Other Ambulatory Visit: Payer: Self-pay

## 2020-08-22 VITALS — BP 128/78 | HR 76 | Temp 98.9°F | Ht 62.0 in | Wt 208.6 lb

## 2020-08-22 DIAGNOSIS — J4520 Mild intermittent asthma, uncomplicated: Secondary | ICD-10-CM | POA: Diagnosis not present

## 2020-08-22 DIAGNOSIS — M545 Low back pain, unspecified: Secondary | ICD-10-CM

## 2020-08-22 DIAGNOSIS — I10 Essential (primary) hypertension: Secondary | ICD-10-CM | POA: Diagnosis not present

## 2020-08-22 DIAGNOSIS — Z23 Encounter for immunization: Secondary | ICD-10-CM | POA: Diagnosis not present

## 2020-08-22 DIAGNOSIS — G8929 Other chronic pain: Secondary | ICD-10-CM | POA: Diagnosis not present

## 2020-08-22 DIAGNOSIS — M79672 Pain in left foot: Secondary | ICD-10-CM | POA: Diagnosis not present

## 2020-08-22 DIAGNOSIS — Z6841 Body Mass Index (BMI) 40.0 and over, adult: Secondary | ICD-10-CM

## 2020-08-22 DIAGNOSIS — M79671 Pain in right foot: Secondary | ICD-10-CM

## 2020-08-22 MED ORDER — TRAMADOL HCL 50 MG PO TABS: 50.0000 mg | ORAL_TABLET | Freq: Three times a day (TID) | ORAL | 2 refills | Status: DC | PRN

## 2020-08-22 MED ORDER — GABAPENTIN 300 MG PO CAPS: 300.0000 mg | ORAL_CAPSULE | Freq: Two times a day (BID) | ORAL | 3 refills | Status: DC

## 2020-08-22 NOTE — Assessment & Plan Note (Signed)
Proair prn, Singulair, Symbicort

## 2020-08-22 NOTE — Assessment & Plan Note (Signed)
Gabapentin po 

## 2020-08-22 NOTE — Addendum Note (Signed)
Addended by: Earnstine Regal on: 08/22/2020 11:57 AM   Modules accepted: Orders

## 2020-08-22 NOTE — Assessment & Plan Note (Signed)
Diovan HCT, Furosemide prn, KCl

## 2020-08-22 NOTE — Progress Notes (Signed)
Subjective:  Patient ID: [name], female    DOB: Jun 10, 1950  Age: 70 y.o. MRN: [id number]  CC: Follow-up (6 month f/u)   HPI [name] presents for R foot pain - surgery is pending (Dr Sharol Given) 8/17 F/u OA, asthma    Outpatient Medications Prior to Visit  Medication Sig Dispense Refill   albuterol (PROVENTIL) (2.5 MG/3ML) 0.083% nebulizer solution Take 3 mLs (2.5 mg total) by nebulization every 4 (four) hours as needed for wheezing or shortness of breath. 225 mL 3   albuterol (VENTOLIN HFA) 108 (90 Base) MCG/ACT inhaler INHALE TWO PUFFS BY MOUTH EVERY 6 HOURS AS NEEDED FOR  WHEEZING/SHORTNESS  OF  BREATH 24 g 1   aspirin 81 MG tablet Take 81 mg by mouth daily.     benzonatate (TESSALON) 100 MG capsule Take 1 capsule (100 mg total) by mouth 3 (three) times daily as needed for cough. 90 capsule 1   budesonide-formoterol (SYMBICORT) 160-4.5 MCG/ACT inhaler Inhale 2 puffs into the lungs 2 (two) times daily. Rinse, gargle and spit out after use 1 each 5   cetirizine (ZYRTEC) 10 MG tablet Take 1 tablet (10 mg total) by mouth daily. 90 tablet 3   Cholecalciferol (VITAMIN D) 1000 UNITS capsule Take 1,000 Units by mouth every morning.     Cyanocobalamin (VITAMIN B-12) 1000 MCG SUBL Place 1 tablet (1,000 mcg total) under the tongue daily. 100 tablet 3   EPINEPHrine 0.3 mg/0.3 mL IJ SOAJ injection Use as directed for severe allergic reaction 2 each 1   famotidine (PEPCID) 40 MG tablet Take 1 tablet (40 mg total) by mouth daily. 90 tablet 3   ferrous sulfate 325 (65 FE) MG tablet Take 325 mg by mouth 3 (three) times daily with meals.      fluticasone (FLONASE) 50 MCG/ACT nasal spray 1-2 sprays each nostril once daily (Patient taking differently: 2 sprays 2 (two) times daily. 1-2 sprays each nostril once daily) 48 g 3   gabapentin (NEURONTIN) 300 MG capsule Take 1 capsule (300 mg total) by mouth 2 (two) times daily. 180 capsule 0   hydrochlorothiazide (HYDRODIURIL) 25 MG tablet Take  1 tablet by mouth once daily 90 tablet 3   ipratropium (ATROVENT) 0.06 % nasal spray Place 1 spray into both nostrils 4 (four) times daily. 45 mL 3   irbesartan (AVAPRO) 150 MG tablet Take 1 tablet by mouth once daily 90 tablet 3   levocetirizine (XYZAL) 5 MG tablet Take 1 tablet (5 mg total) by mouth every evening. 90 tablet 1   meloxicam (MOBIC) 15 MG tablet Take 1 tablet (15 mg total) by mouth daily. 30 tablet 0   methocarbamol (ROBAXIN) 500 MG tablet Take 1 tablet (500 mg total) by mouth every 8 (eight) hours as needed for muscle spasms. Muscle spasms 180 tablet 1   montelukast (SINGULAIR) 10 MG tablet TAKE 1 TABLET ONE TIME DAILY WITH BREAKFAST 90 tablet 3   omeprazole (PRILOSEC) 20 MG capsule Take 1 capsule (20 mg total) by mouth daily. 90 capsule 3   potassium chloride (KLOR-CON) 10 MEQ tablet Take 1 tablet by mouth once daily 90 tablet 3   traMADol (ULTRAM) 50 MG tablet Take 1 tablet (50 mg total) by mouth daily as needed. 30 tablet 0   furosemide (LASIX) 40 MG tablet Take 1 tablet (40 mg total) by mouth daily as needed. (Patient taking differently: Take 40 mg by mouth daily as needed. Only takes as needed) 30 tablet 3   No  facility-administered medications prior to visit.    ROS: Review of Systems  Constitutional:  Negative for activity change, appetite change, chills, fatigue and unexpected weight change.  HENT:  Negative for congestion, mouth sores and sinus pressure.   Eyes:  Negative for visual disturbance.  Respiratory:  Negative for cough and chest tightness.   Gastrointestinal:  Negative for abdominal pain and nausea.  Genitourinary:  Negative for difficulty urinating, frequency and vaginal pain.  Musculoskeletal:  Positive for arthralgias and gait problem. Negative for back pain.  Skin:  Negative for pallor and rash.  Neurological:  Negative for dizziness, tremors, weakness, numbness and headaches.  Psychiatric/Behavioral:  Negative for confusion and sleep disturbance.     Objective:  BP 128/78 (BP Location: Left Arm)   Pulse 76   Temp 98.9 F (37.2 C) (Oral)   Ht '5\' 2"'$  (1.575 m)   Wt 208 lb 9.6 oz (94.6 kg)   SpO2 96%   BMI 38.15 kg/m   BP Readings from Last 3 Encounters:  08/22/20 128/78  08/04/20 110/62  03/13/20 131/78    Wt Readings from Last 3 Encounters:  08/22/20 208 lb 9.6 oz (94.6 kg)  08/04/20 208 lb 12.8 oz (94.7 kg)  03/13/20 212 lb 11.2 oz (96.5 kg)    Physical Exam Constitutional:      General: She is not in acute distress.    Appearance: She is well-developed. She is obese.  HENT:     Head: Normocephalic.     Right Ear: External ear normal.     Left Ear: External ear normal.     Nose: Nose normal.  Eyes:     General:        Right eye: No discharge.        Left eye: No discharge.     Conjunctiva/sclera: Conjunctivae normal.     Pupils: Pupils are equal, round, and reactive to light.  Neck:     Thyroid: No thyromegaly.     Vascular: No JVD.     Trachea: No tracheal deviation.  Cardiovascular:     Rate and Rhythm: Normal rate and regular rhythm.     Heart sounds: Normal heart sounds.  Pulmonary:     Effort: No respiratory distress.     Breath sounds: No stridor. No wheezing.  Abdominal:     General: Bowel sounds are normal. There is no distension.     Palpations: Abdomen is soft. There is no mass.     Tenderness: There is no abdominal tenderness. There is no guarding or rebound.  Musculoskeletal:        General: Tenderness present.     Cervical back: Normal range of motion and neck supple. No rigidity.  Lymphadenopathy:     Cervical: No cervical adenopathy.  Skin:    Findings: No erythema or rash.  Neurological:     Mental Status: She is oriented to person, place, and time.     Cranial Nerves: No cranial nerve deficit.     Motor: No abnormal muscle tone.     Coordination: Coordination abnormal.     Deep Tendon Reflexes: Reflexes normal.  Psychiatric:        Behavior: Behavior normal.        Thought  Content: Thought content normal.        Judgment: Judgment normal.  Obese  Lab Results  Component Value Date   WBC 5.8 02/22/2020   HGB 11.8 (L) 02/22/2020   HCT 37.0 02/22/2020   PLT 261.0 02/22/2020  GLUCOSE 95 02/22/2020   CHOL 170 11/10/2018   TRIG 65.0 11/10/2018   HDL 79.00 11/10/2018   LDLCALC 78 11/10/2018   ALT 13 02/22/2020   AST 14 02/22/2020   NA 140 02/22/2020   K 3.8 02/22/2020   CL 102 02/22/2020   CREATININE 0.91 02/22/2020   BUN 18 02/22/2020   CO2 32 02/22/2020   TSH 2.59 02/22/2020   INR 1.07 02/02/2013   HGBA1C 5.5 09/20/2013    CT ANKLE RIGHT WO CONTRAST  Result Date: 06/15/2020 CLINICAL DATA:  Right ankle pain. History of right ankle ORIF in 2011 EXAM: CT OF THE RIGHT ANKLE WITHOUT CONTRAST TECHNIQUE: Multidetector CT imaging of the right ankle was performed according to the standard protocol. Multiplanar CT image reconstructions were also generated. COMPARISON:  X-ray 03/22/2020, 04/07/2009 FINDINGS: Bones/Joint/Cartilage Status post distal right fibular ORIF via lateral sideplate and screw fixation construct. Hardware intact without loosening or other complication. Two partially threaded medial malleolar fixation screws, intact. No acute fracture. No dislocation. Moderate tibiotalar joint osteoarthritis with joint space narrowing, subchondral sclerosis, and subchondral cystic change. Marginal osteophytes most pronounced at the anterolateral and posteromedial aspects of the tibiotalar joint. No large ankle joint effusion. Mild subtalar arthropathy. Mild-moderate degenerative changes throughout the midfoot, most pronounced at the talocalcaneal articulation. Bidirectional calcaneal enthesophytes. Irregular enthesopathic changes emanating from the medial navicular. No suspicious lytic or sclerotic bone lesions. Ligaments Suboptimally assessed by CT. Muscles and Tendons Prominent thickening of the distal tibialis posterior tendon (series 5, images 51-59). Remaining  tendinous structures appear grossly unremarkable by CT. There is fatty infiltration of the visualized lower leg musculature. Soft tissues Subcutaneous edema most pronounced over the lateral aspect of the lower leg. No organized fluid collection. IMPRESSION: 1. Remote trimalleolar right ankle fracture status post ORIF without hardware complication. 2. Moderate tibiotalar joint osteoarthritis. 3. Prominent thickening of the distal tibialis posterior tendon, suggesting tendinosis. Electronically Signed   By: Davina Poke D.O.   On: 06/15/2020 08:20    Assessment & Plan:

## 2020-08-22 NOTE — Assessment & Plan Note (Signed)
Wt Readings from Last 3 Encounters:  08/22/20 208 lb 9.6 oz (94.6 kg)  08/04/20 208 lb 12.8 oz (94.7 kg)  03/13/20 212 lb 11.2 oz (96.5 kg)  Discussed

## 2020-08-22 NOTE — Assessment & Plan Note (Deleted)
F/u w/Dr Sherrill 

## 2020-08-22 NOTE — Assessment & Plan Note (Signed)
R>>L Surgery pending in 8/22 - Dr Sharol Given Tramadol prn

## 2020-08-24 ENCOUNTER — Other Ambulatory Visit: Payer: Self-pay | Admitting: Internal Medicine

## 2020-08-27 ENCOUNTER — Other Ambulatory Visit: Payer: Self-pay | Admitting: Physician Assistant

## 2020-08-29 ENCOUNTER — Telehealth: Payer: Self-pay | Admitting: *Deleted

## 2020-08-29 ENCOUNTER — Telehealth: Payer: Self-pay | Admitting: Orthopedic Surgery

## 2020-08-29 ENCOUNTER — Other Ambulatory Visit: Payer: Self-pay | Admitting: *Deleted

## 2020-08-29 MED ORDER — BUDESONIDE-FORMOTEROL FUMARATE 160-4.5 MCG/ACT IN AERO: 2.0000 | INHALATION_SPRAY | Freq: Two times a day (BID) | RESPIRATORY_TRACT | 1 refills | Status: DC

## 2020-08-29 MED ORDER — ALBUTEROL SULFATE (2.5 MG/3ML) 0.083% IN NEBU: 2.5000 mg | INHALATION_SOLUTION | RESPIRATORY_TRACT | 3 refills | Status: DC | PRN

## 2020-08-29 MED ORDER — ALBUTEROL SULFATE HFA 108 (90 BASE) MCG/ACT IN AERS: INHALATION_SPRAY | RESPIRATORY_TRACT | 1 refills | Status: DC

## 2020-08-29 NOTE — Telephone Encounter (Signed)
Pt called stating she needs Malachy Mood to call her insurance so she give them the autho. for her surgery. Pt would like a CB after Malachy Mood has done this.  [employer]# [phone]

## 2020-08-29 NOTE — Telephone Encounter (Signed)
Duplicate request med was sent on 08/24/20.Marland KitchenJohny Chess

## 2020-08-29 NOTE — Telephone Encounter (Signed)
-----   Message from Larey Seat sent at 08/28/2020 12:08 PM EDT ----- Refill request

## 2020-08-29 NOTE — Telephone Encounter (Signed)
Pt called requesting a call back about her surgery date. Pt states her insurance company informed her that they dont have info or approval of surgery date. Please call pt about this matter at [phone].

## 2020-08-30 ENCOUNTER — Telehealth: Payer: Self-pay

## 2020-08-30 NOTE — Telephone Encounter (Signed)
Pt called stating that her insurance company is stating they haven't relieved anything regarding her surgery for it to be approved.

## 2020-09-04 ENCOUNTER — Ambulatory Visit (INDEPENDENT_AMBULATORY_CARE_PROVIDER_SITE_OTHER): Payer: Medicare PPO | Admitting: *Deleted

## 2020-09-04 DIAGNOSIS — J309 Allergic rhinitis, unspecified: Secondary | ICD-10-CM | POA: Diagnosis not present

## 2020-09-09 ENCOUNTER — Other Ambulatory Visit: Payer: Self-pay | Admitting: *Deleted

## 2020-09-11 ENCOUNTER — Other Ambulatory Visit: Payer: Medicare PPO

## 2020-09-11 ENCOUNTER — Ambulatory Visit (HOSPITAL_COMMUNITY): Payer: Medicare PPO

## 2020-09-11 ENCOUNTER — Encounter (HOSPITAL_COMMUNITY): Payer: Self-pay

## 2020-09-11 ENCOUNTER — Encounter (HOSPITAL_COMMUNITY)
Admission: RE | Admit: 2020-09-11 | Discharge: 2020-09-11 | Disposition: A | Discharge status: Home / Self Care | Payer: Medicare PPO | Source: Ambulatory Visit | Attending: Orthopedic Surgery | Admitting: Orthopedic Surgery

## 2020-09-11 ENCOUNTER — Ambulatory Visit (HOSPITAL_BASED_OUTPATIENT_CLINIC_OR_DEPARTMENT_OTHER)
Admission: RE | Admit: 2020-09-11 | Discharge: 2020-09-11 | Disposition: A | Discharge status: Home / Self Care | Payer: Medicare PPO | Source: Ambulatory Visit | Attending: Nurse Practitioner | Admitting: Nurse Practitioner

## 2020-09-11 ENCOUNTER — Other Ambulatory Visit: Payer: Self-pay

## 2020-09-11 ENCOUNTER — Inpatient Hospital Stay: Payer: Medicare PPO | Attending: Oncology

## 2020-09-11 DIAGNOSIS — Z0389 Encounter for observation for other suspected diseases and conditions ruled out: Secondary | ICD-10-CM | POA: Diagnosis not present

## 2020-09-11 DIAGNOSIS — Z01818 Encounter for other preprocedural examination: Secondary | ICD-10-CM | POA: Insufficient documentation

## 2020-09-11 DIAGNOSIS — R911 Solitary pulmonary nodule: Secondary | ICD-10-CM | POA: Insufficient documentation

## 2020-09-11 DIAGNOSIS — Z20822 Contact with and (suspected) exposure to covid-19: Secondary | ICD-10-CM | POA: Diagnosis not present

## 2020-09-11 DIAGNOSIS — D649 Anemia, unspecified: Secondary | ICD-10-CM | POA: Insufficient documentation

## 2020-09-11 DIAGNOSIS — C184 Malignant neoplasm of transverse colon: Secondary | ICD-10-CM | POA: Diagnosis not present

## 2020-09-11 DIAGNOSIS — Z9049 Acquired absence of other specified parts of digestive tract: Secondary | ICD-10-CM | POA: Diagnosis not present

## 2020-09-11 DIAGNOSIS — Z9221 Personal history of antineoplastic chemotherapy: Secondary | ICD-10-CM | POA: Diagnosis not present

## 2020-09-11 DIAGNOSIS — J45909 Unspecified asthma, uncomplicated: Secondary | ICD-10-CM | POA: Diagnosis not present

## 2020-09-11 DIAGNOSIS — K469 Unspecified abdominal hernia without obstruction or gangrene: Secondary | ICD-10-CM | POA: Diagnosis not present

## 2020-09-11 DIAGNOSIS — Z85038 Personal history of other malignant neoplasm of large intestine: Secondary | ICD-10-CM | POA: Diagnosis not present

## 2020-09-11 DIAGNOSIS — K439 Ventral hernia without obstruction or gangrene: Secondary | ICD-10-CM | POA: Insufficient documentation

## 2020-09-11 LAB — CBC WITH DIFFERENTIAL (CANCER CENTER ONLY)
Abs Immature Granulocytes: 0.01 10*3/uL (ref 0.00–0.07)
Basophils Absolute: 0.1 10*3/uL (ref 0.0–0.1)
Basophils Relative: 1 %
Eosinophils Absolute: 0.1 10*3/uL (ref 0.0–0.5)
Eosinophils Relative: 2 %
HCT: 36.7 % (ref 36.0–46.0)
Hemoglobin: 11.6 g/dL — ABNORMAL LOW (ref 12.0–15.0)
Immature Granulocytes: 0 %
Lymphocytes Relative: 32 %
Lymphs Abs: 1.7 10*3/uL (ref 0.7–4.0)
MCH: 23.2 pg — ABNORMAL LOW (ref 26.0–34.0)
MCHC: 31.6 g/dL (ref 30.0–36.0)
MCV: 73.3 fL — ABNORMAL LOW (ref 80.0–100.0)
Monocytes Absolute: 0.6 10*3/uL (ref 0.1–1.0)
Monocytes Relative: 11 %
Neutro Abs: 2.9 10*3/uL (ref 1.7–7.7)
Neutrophils Relative %: 54 %
Platelet Count: 249 10*3/uL (ref 150–400)
RBC: 5.01 MIL/uL (ref 3.87–5.11)
RDW: 16.7 % — ABNORMAL HIGH (ref 11.5–15.5)
WBC Count: 5.4 10*3/uL (ref 4.0–10.5)
nRBC: 0 % (ref 0.0–0.2)

## 2020-09-11 LAB — CMP (CANCER CENTER ONLY)
ALT: 15 U/L (ref 0–44)
AST: 16 U/L (ref 15–41)
Albumin: 3.8 g/dL (ref 3.5–5.0)
Alkaline Phosphatase: 126 U/L (ref 38–126)
Anion gap: 10 (ref 5–15)
BUN: 14 mg/dL (ref 8–23)
CO2: 27 mmol/L (ref 22–32)
Calcium: 9.4 mg/dL (ref 8.9–10.3)
Chloride: 103 mmol/L (ref 98–111)
Creatinine: 0.79 mg/dL (ref 0.44–1.00)
GFR, Estimated: >60 mL/min (ref >60)
Glucose, Bld: 97 mg/dL (ref 70–99)
Potassium: 3.7 mmol/L (ref 3.5–5.1)
Sodium: 140 mmol/L (ref 135–145)
Total Bilirubin: 0.6 mg/dL (ref 0.3–1.2)
Total Protein: 7.5 g/dL (ref 6.5–8.1)

## 2020-09-11 LAB — CEA (IN HOUSE-CHCC): CEA (CHCC-In House): 1.46 ng/mL (ref 0.00–5.00)

## 2020-09-11 LAB — SURGICAL PCR SCREEN
MRSA, PCR: POSITIVE — AB
Staphylococcus aureus: POSITIVE — AB

## 2020-09-11 LAB — SARS CORONAVIRUS 2 (TAT 6-24 HRS): SARS Coronavirus 2: NEGATIVE

## 2020-09-11 LAB — CEA (ACCESS): CEA (CHCC): 1.72 ng/mL (ref 0.00–5.00)

## 2020-09-11 MED ORDER — IOHEXOL 350 MG/ML SOLN
80.0000 mL | Freq: Once | INTRAVENOUS | Status: AC | PRN
  Administered 2020-09-11: 80 mL via INTRAVENOUS

## 2020-09-11 NOTE — Progress Notes (Signed)
Subjective:    Patient ID: [name], female    DOB: 01-Jan-1951, 70 y.o.   MRN: [id number]  HPI F never smoker followed for asthma, allergic rhinitis,, OSA, obesity/hypoventilation, complicated by GERD, arthritis, HBP, Colon Ca/ L hemicolectomy NPSG 12/12/05-AHI 38.4/hour, desaturation to 66%, body weight 250 pounds  --------------------------------------------------------------------------- 09/13/19- 70 year old female never smoker followed for OSA, complicated by asthma, allergic rhinitis,  obesity/hypoventilation,  GERD, arthritis, HBP, colon cancer/left hemicolectomy   Allergy vaccine- Dr Ernst Bowler CPAP 9/[employer]  Followed by Dr Learta Codding for Colon cancer in remission. He is aware of lung nodule. F/u CT chest pending 09/28/19. Has crack in humidifier chamber- for some reason [employer] says they need the serial number from her machine to fix this. Unclear why they don't have that?? Had 2 Moderna Covax Saw Dr Teoh/ ENT for dry cough, was scoped, dx'c upper airway cough, given gabapentin which helped.  Continues f/u with Allergy on vaccine for allergy and asthma. Feels controlled.  CT chest 03/09/18-  IMPRESSION: 1. No evidence of recurrent or metastatic disease. 2. 2 mm posterior right upper lobe nodule, nonspecific. Continued attention on follow-up exams is suggested. 3. Right pelvic spigelian hernia contains unobstructed small bowel. 4.  09/12/20- Virtual Visit via Telephone Note  I connected with [name] on 09/12/20 at 10:30 AM EDT by telephone and verified that I am speaking with the correct person using two identifiers.  Location: Patient: Home Provider: Office   I discussed the limitations, risks, security and privacy concerns of performing an evaluation and management service by telephone and the availability of in person appointments. I also discussed with the patient that there may be a patient responsible charge related to this service. The patient expressed  understanding and agreed to proceed.   History of Present Illness: 70 year old female never smoker followed for OSA, complicated by asthma, allergic rhinitis,  obesity/hypoventilation,  GERD, arthritis, HBP, Colon Cancer/left ,   Allergy vaccine- Dr Ernst Bowler Pending foot surgery tomorrow- has no concerns or acute health issues affecting this.  Her CPAP machine is about 71 years old and has leak and wiring connection problems. [employer] doesn't have a convenient office- lives in [place]. We discussed possile change to more local DME.  She reports comfortable with CPAP. CPAP 9/[employer]  Download-  Covid vax-  CT chest 09/13/19- IMPRESSION: 1. No evidence to suggest metastatic disease to the chest. 2. Small hiatal hernia   Observations/Objective:   Assessment and Plan:   Follow Up Instructions:    I discussed the assessment and treatment plan with the patient. The patient was provided an opportunity to ask questions and all were answered. The patient agreed with the plan and demonstrated an understanding of the instructions.   The patient was advised to call back or seek an in-person evaluation if the symptoms worsen or if the condition fails to improve as anticipated.  I provided 18 minutes of non-face-to-face time during this encounter.   Baird Lyons, MD    ROS-see HPI   + = positive Constitutional:   No-   weight loss, night sweats, fevers, chills, fatigue, lassitude. HEENT:   No-  headaches, difficulty swallowing, tooth/dental problems, sore throat,       No-  sneezing, itching, ear ache, nasal congestion, + post nasal drip,  CV:  No-   chest pain, orthopnea, PND, swelling in lower extremities, anasarca, dizziness, palpitations Resp: No- acute  shortness of breath with exertion or at rest.  No-   productive cough,  + non-productive cough,  No- coughing up of blood.              No-   change in color of mucus.  No-sustained wheezing.   Skin: No-   rash or  lesions. GI:  + heartburn, indigestion, no-abdominal pain, nausea, vomiting,  GU: No  complaint MS:  No-   joint pain or swelling.   Neuro-     nothing unusual Psych:  No- change in mood or affect. No depression or anxiety.  No memory loss.  OBJ- Physical Exam   similar to previous exams General- Alert, Oriented, Affect-appropriate, Distress- none acute, +obese Skin- rash-none, lesions- none, excoriation- none Lymphadenopathy- none Head- atraumatic            Eyes- Gross vision intact, PERRLA, conjunctivae and secretions clear            Ears- Hearing, canals-normal            Nose- Clear, no-Septal dev, mucus, polyps, erosion, perforation             Throat- Mallampati III , mucosa clear,not dry , drainage- none, tonsils- atrophic,  Neck- flexible , trachea midline, no stridor , thyroid nl, carotid no bruit Chest - symmetrical excursion , unlabored           Heart/CV- RRR , no murmur , no gallop  , no rub, nl s1 s2                           - JVD- none , edema- none, stasis changes- none, varices- none           Lung- clear to P&A, wheeze- none, cough-none , dullness-none, rub- none           Chest wall-  Abd-  Br/ Gen/ Rectal- Not done, not indicated Extrem- cyanosis- none, clubbing, none, atrophy- none, strength- nl Neuro- grossly intact to observation

## 2020-09-11 NOTE — Pre-Procedure Instructions (Signed)
    [name]  09/11/2020   PCP - Dr. Elinor Dodge  Cardiologist - none  Pulm- Dr Annamaria Boots  Allergisis : Dr Lawson Fiscal  Chest x-ray -   EKG -   Stress Test -   ECHO -   Cardiac Cath -   AICD- PM- LOOP-  Dialysis-  Sleep Study -  CPAP -   LABS-  ASA-  ERAS-  HA1C- Fasting Blood Sugar -  Checks Blood Sugar _____ times a day  Anesthesia-  Pt denies having chest pain, sob, or fever at this time. All instructions explained to the pt, with a verbal understanding of the material. Pt agrees to go over the instructions while at home for a better understanding. Pt also instructed to self quarantine after being tested for COVID-19. The opportunity to ask questions was provided.

## 2020-09-11 NOTE — Pre-Procedure Instructions (Signed)
[name]  09/11/2020      Your procedure is scheduled on  Wednesday, August 17.  Report to [facility], Main Entrance or Entrance "A" at 6:30 AM.                Your surgery or procedure is scheduled to begin at 8:30 AM   Call this number if you have problems the morning of surgery: [phone]  This is the number for the Pre- Surgical Desk.                For any other questions, please call [phone], Monday - Friday 8 AM - 4 PM.   Remember:  Do not eat or drink after midnight Tuesday, August 16.  You may drink clear liquids until 5:30 AM . Clear liquids allowed are:    Water, Juice (non-citric and without pulp - diabetics please choose diet or no sugar options), Carbonated beverages - (diabetics please choose diet or no sugar options), Clear Tea, Black Coffee only (no creamer, milk or cream including half and half), Plain Jell-O only (diabetics please choose diet or no sugar options), Gatorade (diabetics please choose diet or no sugar options), and Plain Popsicles only Drink the Pre-Surgery Ensure you were given between 4 :30 AM and 5:30 AM   Take these medicines the morning of surgery with A SIP OF WATER: Gabapentin Monkelukast (Singulair) Omeprazole (Prilosec) Use Budesonide-Formoterol (Symbicort) inhaler Tramadol (Ultram)  Take if needed: Albuterol nebulizer or Albuterol inhaler if needed - bring the inhaler with you. Flonase nasal spray Ipratopium (Atrovent)-nasal spray Tramadol (Ultram)   1 Week prior to surgery STOP taking Aspirin, Aspirin Products (Goody Powder, Excedrin Migraine), Ibuprofen (Advil), Naproxen (Aleve), Vitamins and Herbal Products (ie Fish Oil).    Special instructions:    [place]- Preparing For Surgery  Before surgery, you can play an important role. Because skin is not sterile, your skin needs to be as free of germs as possible. You can reduce the number of germs on your skin by washing with CHG (chlorahexidine gluconate)  Soap before surgery.  CHG is an antiseptic cleaner which kills germs and bonds with the skin to continue killing germs even after washing.    Oral Hygiene is also important to reduce your risk of infection.  Remember - BRUSH YOUR TEETH THE MORNING OF SURGERY WITH YOUR REGULAR TOOTHPASTE  Please do not use if you have an allergy to CHG or antibacterial soaps. If your skin becomes reddened/irritated stop using the CHG.  Do not shave (including legs and underarms) for at least 48 hours prior to first CHG shower. It is OK to shave your face.  Please follow these instructions carefully.   Shower the NIGHT BEFORE SURGERY and the MORNING OF SURGERY with CHG.   If you chose to wash your hair, wash your hair first as usual with your normal shampoo.  After you shampoo, wash your face and private area with the soap you use at home, then rinse your hair and body thoroughly to remove the shampoo and soap.  Use CHG as you would any other liquid soap. You can apply CHG directly to the skin and wash gently with a scrungie or a clean washcloth.   Apply the CHG Soap to your body ONLY FROM THE NECK DOWN.  Do not use on open wounds or open sores. Avoid contact with your eyes, ears, mouth and genitals (private parts).   Wash thoroughly, paying special attention to the area  where your surgery will be performed.  Thoroughly rinse your body with warm water from the neck down.  DO NOT shower/wash with your normal soap after using and rinsing off the CHG Soap.  Pat yourself dry with a CLEAN TOWEL.  Wear CLEAN PAJAMAS to bed the night before surgery, wear comfortable clothes the morning of surgery  Place CLEAN SHEETS on your bed the night of your first shower and DO NOT SLEEP WITH PETS.  Day of Surgery: Shower as instructed above. Do not apply any deodorants/lotions, powders or colognes.  Please wear clean clothes to the hospital/surgery center.   Remember to brush your teeth WITH YOUR REGULAR  TOOTHPASTE.  Do not wear jewelry, make-up or nail polish.  Do not shave 48 hours prior to surgery.  Men may shave face and neck.  Do not bring valuables to the hospital.  [facility] is not responsible for any belongings or valuables.  Contacts, dentures or bridgework may not be worn into surgery.  Leave your suitcase in the car.  After surgery it may be brought to your room.  For patients admitted to the hospital, discharge time will be determined by your treatment team.  Patients discharged the day of surgery will not be allowed to drive home.   Please read over the fact sheets that you were given.

## 2020-09-11 NOTE — Pre-Procedure Instructions (Signed)
[name]  09/11/2020     Your procedure is scheduled on Wednesday, August 17.  Report to [facility], Main Entrance or Entrance "A" at 6:30 AM .                Your surgery or procedure is scheduled to begin at 8:30 AM   Call this number if you have problems the morning of surgery: [phone]  This is the number for the Pre- Surgical Desk.                For any other questions, please call [phone], Monday - Friday 8 AM - 4 PM.   Remember:  Do not eat or drink after midnight Tuesday, August 16.  You may drink clear liquids until 5:30 AM.  Clear liquids allowed are:                    Water, Juice (non-citric and without pulp - diabetics please choose diet or no sugar options), Carbonated beverages - (diabetics please choose diet or no sugar options), Clear Tea, Black Coffee only (no creamer, milk or cream including half and half), Plain Jell-O only (diabetics please choose diet or no sugar options), Gatorade (diabetics please choose diet or no sugar options), and Plain Popsicles only    Drink the Pre- surgery Ensure between 4:30 AM and 5:30 AM- Nothing to drink after 5:30 AM   Take these medicines the morning of surgery with A SIP OF WATER: gabapentin (NEURONTIN) montelukast (SINGULAIR) omeprazole (PRILOSEC) [id number] (SYMBICORT)  Take/Use if needed: albuterol (PROVENTIL) Nebulizer albuterol (VENTOLIN HFA) 108 (90 Base) MCG/ACT inhaler  - bring it to the hospital with you. benzonatate (TESSALON) fluticasone (FLONASE) Nasal spray ipratropium (ATROVENT) nasal spray traMADol (ULTRAM)  1 Week prior to surgery STOP taking Aspirin Products (Goody Powder, Excedrin Migraine), Ibuprofen (Advil), Naproxen (Aleve), Vitamins and Herbal Products (ie Fish Oil).    Special instructions:    [place]- Preparing For Surgery  Before surgery, you can play an important role. Because skin is not sterile, your skin needs to be as free of germs as  possible. You can reduce the number of germs on your skin by washing with CHG (chlorahexidine gluconate) Soap before surgery.  CHG is an antiseptic cleaner which kills germs and bonds with the skin to continue killing germs even after washing.    Oral Hygiene is also important to reduce your risk of infection.  Remember - BRUSH YOUR TEETH THE MORNING OF SURGERY WITH YOUR REGULAR TOOTHPASTE  Please do not use if you have an allergy to CHG or antibacterial soaps. If your skin becomes reddened/irritated stop using the CHG.  Do not shave (including legs and underarms) for at least 48 hours prior to first CHG shower. It is OK to shave your face.  Please follow these instructions carefully.   Shower the NIGHT BEFORE SURGERY and the MORNING OF SURGERY with CHG.   If you chose to wash your hair, wash your hair first as usual with your normal shampoo.  After you shampoo, wash your face and private area with the soap you use at home, then rinse your hair and body thoroughly to remove the shampoo and soap.  Use CHG as you would any other liquid soap. You can apply CHG directly to the skin and wash gently with a scrungie or a clean washcloth.   Apply the CHG Soap to your body ONLY FROM THE NECK DOWN.  Do not use  on open wounds or open sores. Avoid contact with your eyes, ears, mouth and genitals (private parts).   Wash thoroughly, paying special attention to the area where your surgery will be performed.  Thoroughly rinse your body with warm water from the neck down.  DO NOT shower/wash with your normal soap after using and rinsing off the CHG Soap.  Pat yourself dry with a CLEAN TOWEL.  Wear CLEAN PAJAMAS to bed the night before surgery, wear comfortable clothes the morning of surgery  Place CLEAN SHEETS on your bed the night of your first shower and DO NOT SLEEP WITH PETS.  Day of Surgery: Shower as instructed above. Do not apply any deodorants/lotions, powders or colognes.  Please wear clean  clothes to the hospital/surgery center.   Remember to brush your teeth WITH YOUR REGULAR TOOTHPASTE.  Do not wear jewelry, make-up or nail polish.  Do not shave 48 hours prior to surgery.  Men may shave face and neck.  Do not bring valuables to the hospital.  [facility] is not responsible for any belongings or valuables.  Contacts, dentures or bridgework may not be worn into surgery.  Leave your suitcase in the car.  After surgery it may be brought to your room.  For patients admitted to the hospital, discharge time will be determined by your treatment team.  Patients discharged the day of surgery will not be allowed to drive home.   Please read over the fact sheets that you were given.

## 2020-09-11 NOTE — Pre-Procedure Instructions (Addendum)
    MERRILYN PAGANI  09/11/2020   PCP - Dr. Elinor Dodge  Cardiologist - none  Pulm- Dr Annamaria Boots  Allergist : Dr Lawson Fiscal- patient receives allergy shots weakly.  Oncologist Dr Benay Spice  Chest x-ray - na  EKG - 09/11/20  Stress Test -   ECHO - 2015  Cardiac Cath - no  AICD-no PM-no LOOP-no  Dialysis-no  Sleep Study - yes CPAP - yes  LABS- CBC with diff and CMP- drawn at the [facility]- on 09/11/20  ASA-no instructions- will not take in am.  ERAS-yes  HA1C-na Fasting Blood Sugar - na Checks Blood Sugar ___na__ times a day  Anesthesia-Mrs. Hurless's blood pressure on arrival was 134/97, HR rate 62. Pulse rate on EKG was 47.  I rechecked the blood pressure and did an apical heart rate, prior patient leaving, blood pressure was 132/80, HR was 60. Mrs. Mory has reported that she has been told that her heart rate runs low at times.  Pt denies having chest pain, sob, or fever at this time. All instructions explained to the pt, with a verbal understanding of the material. Pt agrees to go over the instructions while at home for a better understanding. Pt also instructed to self quarantine after being tested for COVID-19. The opportunity to ask questions was provided.

## 2020-09-11 NOTE — Progress Notes (Signed)
Surgical PCR from 8/15 is positive for MRSA  Left message for [name] [phone]

## 2020-09-12 ENCOUNTER — Ambulatory Visit (INDEPENDENT_AMBULATORY_CARE_PROVIDER_SITE_OTHER): Payer: Medicare PPO | Admitting: Internal Medicine

## 2020-09-12 ENCOUNTER — Encounter: Payer: Self-pay | Admitting: Internal Medicine

## 2020-09-12 VITALS — Wt 206.0 lb

## 2020-09-12 DIAGNOSIS — G4733 Obstructive sleep apnea (adult) (pediatric): Secondary | ICD-10-CM | POA: Diagnosis not present

## 2020-09-12 DIAGNOSIS — J4540 Moderate persistent asthma, uncomplicated: Secondary | ICD-10-CM

## 2020-09-12 NOTE — Patient Instructions (Signed)
Order- [facility]- Patient lives in [place] and can't get to an [place] office close to her.  Can we change to a DME more local to [facility] to continue CPAP 9, mask of choice, humidifier, supplies, AirView / Card ?    Current machine needs service.

## 2020-09-12 NOTE — Assessment & Plan Note (Signed)
Uncomplicated with no recent exacerbation

## 2020-09-12 NOTE — Assessment & Plan Note (Signed)
Describing problems with machine. Not sure if eligible for replacement. Does need service. She lives in [place] and has difficulty reaching [place] office.  Plan- service or replace (could change to auto 5-15 if eligible) machine,  Try changing DME to more local to her.

## 2020-09-13 ENCOUNTER — Ambulatory Visit (HOSPITAL_COMMUNITY): Payer: Medicare PPO | Admitting: Certified Registered"

## 2020-09-13 ENCOUNTER — Telehealth: Payer: Self-pay

## 2020-09-13 ENCOUNTER — Encounter (HOSPITAL_COMMUNITY)
Admission: RE | Disposition: A | Discharge status: Home / Self Care | Payer: Self-pay | Source: Ambulatory Visit | Attending: Orthopedic Surgery

## 2020-09-13 ENCOUNTER — Observation Stay (HOSPITAL_COMMUNITY)
Admission: RE | Admit: 2020-09-13 | Discharge: 2020-09-14 | Disposition: A | Discharge status: Home / Self Care | Payer: Medicare PPO | Source: Ambulatory Visit | Attending: Orthopedic Surgery | Admitting: Orthopedic Surgery

## 2020-09-13 ENCOUNTER — Encounter (HOSPITAL_COMMUNITY): Payer: Self-pay | Admitting: Orthopedic Surgery

## 2020-09-13 ENCOUNTER — Other Ambulatory Visit: Payer: Self-pay

## 2020-09-13 ENCOUNTER — Ambulatory Visit (HOSPITAL_COMMUNITY): Payer: Medicare PPO

## 2020-09-13 DIAGNOSIS — M76821 Posterior tibial tendinitis, right leg: Secondary | ICD-10-CM | POA: Diagnosis not present

## 2020-09-13 DIAGNOSIS — Z7982 Long term (current) use of aspirin: Secondary | ICD-10-CM | POA: Insufficient documentation

## 2020-09-13 DIAGNOSIS — I10 Essential (primary) hypertension: Secondary | ICD-10-CM | POA: Insufficient documentation

## 2020-09-13 DIAGNOSIS — Z79899 Other long term (current) drug therapy: Secondary | ICD-10-CM | POA: Insufficient documentation

## 2020-09-13 DIAGNOSIS — J4540 Moderate persistent asthma, uncomplicated: Secondary | ICD-10-CM | POA: Diagnosis not present

## 2020-09-13 DIAGNOSIS — M19071 Primary osteoarthritis, right ankle and foot: Secondary | ICD-10-CM | POA: Diagnosis not present

## 2020-09-13 DIAGNOSIS — J449 Chronic obstructive pulmonary disease, unspecified: Secondary | ICD-10-CM | POA: Diagnosis not present

## 2020-09-13 DIAGNOSIS — Z85038 Personal history of other malignant neoplasm of large intestine: Secondary | ICD-10-CM | POA: Diagnosis not present

## 2020-09-13 DIAGNOSIS — M79671 Pain in right foot: Secondary | ICD-10-CM | POA: Diagnosis present

## 2020-09-13 DIAGNOSIS — J45909 Unspecified asthma, uncomplicated: Secondary | ICD-10-CM | POA: Insufficient documentation

## 2020-09-13 DIAGNOSIS — K219 Gastro-esophageal reflux disease without esophagitis: Secondary | ICD-10-CM | POA: Diagnosis not present

## 2020-09-13 HISTORY — PX: FOOT ARTHRODESIS: SHX1655

## 2020-09-13 LAB — GLUCOSE, CAPILLARY: Glucose-Capillary: 124 mg/dL — ABNORMAL HIGH (ref 70–99)

## 2020-09-13 SURGERY — FUSION, JOINT, FOOT
Anesthesia: Monitor Anesthesia Care | Site: Foot | Laterality: Right

## 2020-09-13 MED ORDER — CHLORHEXIDINE GLUCONATE 0.12 % MT SOLN
15.0000 mL | Freq: Once | OROMUCOSAL | Status: AC
  Administered 2020-09-13: 15 mL via OROMUCOSAL
  Filled 2020-09-13: qty 15

## 2020-09-13 MED ORDER — LORATADINE 10 MG PO TABS
10.0000 mg | ORAL_TABLET | Freq: Every day | ORAL | Status: DC
  Administered 2020-09-13 – 2020-09-14 (×2): 10 mg via ORAL
  Filled 2020-09-13 (×2): qty 1

## 2020-09-13 MED ORDER — FENTANYL CITRATE (PF) 250 MCG/5ML IJ SOLN
INTRAMUSCULAR | Status: DC | PRN
  Administered 2020-09-13: 50 ug via INTRAVENOUS

## 2020-09-13 MED ORDER — VANCOMYCIN HCL IN DEXTROSE 1-5 GM/200ML-% IV SOLN
1000.0000 mg | INTRAVENOUS | Status: AC
  Administered 2020-09-13: 1000 mg via INTRAVENOUS
  Filled 2020-09-13: qty 200

## 2020-09-13 MED ORDER — HYDROMORPHONE HCL 1 MG/ML IJ SOLN
0.5000 mg | INTRAMUSCULAR | Status: DC | PRN
  Administered 2020-09-13: 0.5 mg via INTRAVENOUS
  Filled 2020-09-13: qty 0.5

## 2020-09-13 MED ORDER — ASPIRIN EC 81 MG PO TBEC
81.0000 mg | DELAYED_RELEASE_TABLET | Freq: Every morning | ORAL | Status: DC
  Administered 2020-09-14: 81 mg via ORAL
  Filled 2020-09-13: qty 1

## 2020-09-13 MED ORDER — FENTANYL CITRATE (PF) 250 MCG/5ML IJ SOLN
INTRAMUSCULAR | Status: AC
  Filled 2020-09-13: qty 5

## 2020-09-13 MED ORDER — DEXAMETHASONE SODIUM PHOSPHATE 10 MG/ML IJ SOLN
INTRAMUSCULAR | Status: AC
  Filled 2020-09-13: qty 1

## 2020-09-13 MED ORDER — OXYCODONE HCL 5 MG/5ML PO SOLN: 5.0000 mg | Freq: Once | ORAL | Status: DC | PRN

## 2020-09-13 MED ORDER — PROMETHAZINE HCL 25 MG/ML IJ SOLN: 6.2500 mg | INTRAMUSCULAR | Status: DC | PRN

## 2020-09-13 MED ORDER — PROPOFOL 10 MG/ML IV BOLUS
INTRAVENOUS | Status: AC
  Filled 2020-09-13: qty 20

## 2020-09-13 MED ORDER — ALBUTEROL SULFATE (2.5 MG/3ML) 0.083% IN NEBU: 2.5000 mg | INHALATION_SOLUTION | RESPIRATORY_TRACT | Status: DC | PRN

## 2020-09-13 MED ORDER — PANTOPRAZOLE SODIUM 40 MG PO TBEC
40.0000 mg | DELAYED_RELEASE_TABLET | Freq: Every day | ORAL | Status: DC
  Administered 2020-09-14: 40 mg via ORAL
  Filled 2020-09-13: qty 1

## 2020-09-13 MED ORDER — LACTATED RINGERS IV SOLN: INTRAVENOUS | Status: DC | PRN

## 2020-09-13 MED ORDER — HYDROCHLOROTHIAZIDE 25 MG PO TABS
25.0000 mg | ORAL_TABLET | Freq: Every day | ORAL | Status: DC
  Administered 2020-09-13 – 2020-09-14 (×2): 25 mg via ORAL
  Filled 2020-09-13 (×2): qty 1

## 2020-09-13 MED ORDER — METOCLOPRAMIDE HCL 5 MG/ML IJ SOLN: 5.0000 mg | Freq: Three times a day (TID) | INTRAMUSCULAR | Status: DC | PRN

## 2020-09-13 MED ORDER — ACETAMINOPHEN 325 MG PO TABS: 325.0000 mg | ORAL_TABLET | ORAL | Status: DC | PRN

## 2020-09-13 MED ORDER — PROPOFOL 500 MG/50ML IV EMUL
INTRAVENOUS | Status: DC | PRN
  Administered 2020-09-13: 100 ug/kg/min via INTRAVENOUS

## 2020-09-13 MED ORDER — GLYCOPYRROLATE PF 0.2 MG/ML IJ SOSY
PREFILLED_SYRINGE | INTRAMUSCULAR | Status: AC
  Filled 2020-09-13: qty 1

## 2020-09-13 MED ORDER — CEFAZOLIN SODIUM-DEXTROSE 2-4 GM/100ML-% IV SOLN
2.0000 g | INTRAVENOUS | Status: DC
  Filled 2020-09-13: qty 100

## 2020-09-13 MED ORDER — MOMETASONE FURO-FORMOTEROL FUM 200-5 MCG/ACT IN AERO
2.0000 | INHALATION_SPRAY | Freq: Two times a day (BID) | RESPIRATORY_TRACT | Status: DC
  Administered 2020-09-13 – 2020-09-14 (×2): 2 via RESPIRATORY_TRACT
  Filled 2020-09-13: qty 8.8

## 2020-09-13 MED ORDER — GLYCOPYRROLATE 0.2 MG/ML IJ SOLN
INTRAMUSCULAR | Status: DC | PRN
  Administered 2020-09-13: .2 mg via INTRAVENOUS

## 2020-09-13 MED ORDER — CEFAZOLIN SODIUM-DEXTROSE 2-4 GM/100ML-% IV SOLN
2.0000 g | Freq: Four times a day (QID) | INTRAVENOUS | Status: AC
  Administered 2020-09-13 – 2020-09-14 (×3): 2 g via INTRAVENOUS
  Filled 2020-09-13 (×4): qty 100

## 2020-09-13 MED ORDER — ROPIVACAINE HCL 5 MG/ML IJ SOLN
INTRAMUSCULAR | Status: DC | PRN
  Administered 2020-09-13: 15 mL via PERINEURAL

## 2020-09-13 MED ORDER — ORAL CARE MOUTH RINSE: 15.0000 mL | Freq: Once | OROMUCOSAL | Status: AC

## 2020-09-13 MED ORDER — ACETAMINOPHEN 160 MG/5ML PO SOLN: 325.0000 mg | ORAL | Status: DC | PRN

## 2020-09-13 MED ORDER — ONDANSETRON HCL 4 MG/2ML IJ SOLN
INTRAMUSCULAR | Status: DC | PRN
  Administered 2020-09-13: 4 mg via INTRAVENOUS

## 2020-09-13 MED ORDER — AMISULPRIDE (ANTIEMETIC) 5 MG/2ML IV SOLN: 10.0000 mg | Freq: Once | INTRAVENOUS | Status: DC | PRN

## 2020-09-13 MED ORDER — FAMOTIDINE 20 MG PO TABS
40.0000 mg | ORAL_TABLET | Freq: Every day | ORAL | Status: DC
  Administered 2020-09-13: 40 mg via ORAL
  Filled 2020-09-13: qty 2

## 2020-09-13 MED ORDER — LIDOCAINE HCL (CARDIAC) PF 100 MG/5ML IV SOSY
PREFILLED_SYRINGE | INTRAVENOUS | Status: DC | PRN
  Administered 2020-09-13: 80 mg via INTRATRACHEAL

## 2020-09-13 MED ORDER — FENTANYL CITRATE (PF) 100 MCG/2ML IJ SOLN: 25.0000 ug | INTRAMUSCULAR | Status: DC | PRN

## 2020-09-13 MED ORDER — OXYCODONE HCL 5 MG PO TABS: 5.0000 mg | ORAL_TABLET | Freq: Once | ORAL | Status: DC | PRN

## 2020-09-13 MED ORDER — FENTANYL CITRATE (PF) 100 MCG/2ML IJ SOLN
25.0000 ug | INTRAMUSCULAR | Status: DC | PRN
  Administered 2020-09-13: 25 ug via INTRAVENOUS

## 2020-09-13 MED ORDER — BUPIVACAINE-EPINEPHRINE (PF) 0.5% -1:200000 IJ SOLN
INTRAMUSCULAR | Status: DC | PRN
  Administered 2020-09-13: 30 mL via PERINEURAL

## 2020-09-13 MED ORDER — 0.9 % SODIUM CHLORIDE (POUR BTL) OPTIME
TOPICAL | Status: DC | PRN
  Administered 2020-09-13: 2000 mL

## 2020-09-13 MED ORDER — GABAPENTIN 300 MG PO CAPS
300.0000 mg | ORAL_CAPSULE | Freq: Two times a day (BID) | ORAL | Status: DC
  Administered 2020-09-13 – 2020-09-14 (×2): 300 mg via ORAL
  Filled 2020-09-13 (×2): qty 1

## 2020-09-13 MED ORDER — ONDANSETRON HCL 4 MG PO TABS: 4.0000 mg | ORAL_TABLET | Freq: Four times a day (QID) | ORAL | Status: DC | PRN

## 2020-09-13 MED ORDER — IRBESARTAN 150 MG PO TABS
150.0000 mg | ORAL_TABLET | Freq: Every day | ORAL | Status: DC
  Administered 2020-09-13 – 2020-09-14 (×2): 150 mg via ORAL
  Filled 2020-09-13: qty 2
  Filled 2020-09-13: qty 1

## 2020-09-13 MED ORDER — LACTATED RINGERS IV SOLN: INTRAVENOUS | Status: DC

## 2020-09-13 MED ORDER — SODIUM CHLORIDE 0.9 % IV SOLN: INTRAVENOUS | Status: DC

## 2020-09-13 MED ORDER — POTASSIUM CHLORIDE ER 10 MEQ PO TBCR
10.0000 meq | EXTENDED_RELEASE_TABLET | Freq: Every day | ORAL | Status: DC
  Administered 2020-09-13 – 2020-09-14 (×2): 10 meq via ORAL
  Filled 2020-09-13 (×4): qty 1

## 2020-09-13 MED ORDER — METOCLOPRAMIDE HCL 5 MG PO TABS: 5.0000 mg | ORAL_TABLET | Freq: Three times a day (TID) | ORAL | Status: DC | PRN

## 2020-09-13 MED ORDER — MONTELUKAST SODIUM 10 MG PO TABS
10.0000 mg | ORAL_TABLET | Freq: Every day | ORAL | Status: DC
  Administered 2020-09-13: 10 mg via ORAL
  Filled 2020-09-13: qty 1

## 2020-09-13 MED ORDER — OXYCODONE HCL 5 MG PO TABS
5.0000 mg | ORAL_TABLET | ORAL | Status: DC | PRN
  Administered 2020-09-13: 10 mg via ORAL
  Filled 2020-09-13: qty 2

## 2020-09-13 MED ORDER — ACETAMINOPHEN 325 MG PO TABS: 325.0000 mg | ORAL_TABLET | Freq: Four times a day (QID) | ORAL | Status: DC | PRN

## 2020-09-13 MED ORDER — ACETAMINOPHEN 10 MG/ML IV SOLN: 1000.0000 mg | Freq: Once | INTRAVENOUS | Status: DC | PRN

## 2020-09-13 MED ORDER — DEXAMETHASONE SODIUM PHOSPHATE 10 MG/ML IJ SOLN
INTRAMUSCULAR | Status: DC | PRN
  Administered 2020-09-13: 10 mg via INTRAVENOUS

## 2020-09-13 MED ORDER — ONDANSETRON HCL 4 MG/2ML IJ SOLN
INTRAMUSCULAR | Status: AC
  Filled 2020-09-13: qty 2

## 2020-09-13 MED ORDER — ALBUTEROL SULFATE HFA 108 (90 BASE) MCG/ACT IN AERS: 2.0000 | INHALATION_SPRAY | Freq: Four times a day (QID) | RESPIRATORY_TRACT | Status: DC | PRN

## 2020-09-13 MED ORDER — FENTANYL CITRATE (PF) 100 MCG/2ML IJ SOLN
INTRAMUSCULAR | Status: AC
  Filled 2020-09-13: qty 2

## 2020-09-13 MED ORDER — ONDANSETRON HCL 4 MG/2ML IJ SOLN: 4.0000 mg | Freq: Four times a day (QID) | INTRAMUSCULAR | Status: DC | PRN

## 2020-09-13 MED ORDER — DOCUSATE SODIUM 100 MG PO CAPS
100.0000 mg | ORAL_CAPSULE | Freq: Two times a day (BID) | ORAL | Status: DC
  Administered 2020-09-13 – 2020-09-14 (×3): 100 mg via ORAL
  Filled 2020-09-13 (×3): qty 1

## 2020-09-13 SURGICAL SUPPLY — 47 items
BAG COUNTER SPONGE SURGICOUNT (BAG) ×2 IMPLANT
BIT DRILL 4.5 TYB 9 (BIT) ×2 IMPLANT
BLADE SAW SGTL HD 18.5X60.5X1. (BLADE) ×2 IMPLANT
BLADE SURG 10 STRL SS (BLADE) ×2 IMPLANT
BNDG COHESIVE 3X5 TAN STRL LF (GAUZE/BANDAGES/DRESSINGS) ×2 IMPLANT
BNDG COHESIVE 4X5 TAN STRL (GAUZE/BANDAGES/DRESSINGS) ×2 IMPLANT
BNDG ESMARK 4X9 LF (GAUZE/BANDAGES/DRESSINGS) ×2 IMPLANT
BNDG GAUZE ELAST 4 BULKY (GAUZE/BANDAGES/DRESSINGS) ×2 IMPLANT
BUR EGG ELITE 4.0 (BURR) ×2 IMPLANT
COTTON STERILE ROLL (GAUZE/BANDAGES/DRESSINGS) ×2 IMPLANT
COVER MAYO STAND STRL (DRAPES) ×2 IMPLANT
COVER SURGICAL LIGHT HANDLE (MISCELLANEOUS) ×4 IMPLANT
DRAPE INCISE IOBAN 66X45 STRL (DRAPES) ×2 IMPLANT
DRAPE OEC MINIVIEW 54X84 (DRAPES) ×2 IMPLANT
DRAPE U-SHAPE 47X51 STRL (DRAPES) ×2 IMPLANT
DRSG ADAPTIC 3X8 NADH LF (GAUZE/BANDAGES/DRESSINGS) ×2 IMPLANT
DURAPREP 26ML APPLICATOR (WOUND CARE) ×2 IMPLANT
ELECT REM PT RETURN 9FT ADLT (ELECTROSURGICAL) ×2
ELECTRODE REM PT RTRN 9FT ADLT (ELECTROSURGICAL) ×1 IMPLANT
GAUZE SPONGE 4X4 12PLY STRL (GAUZE/BANDAGES/DRESSINGS) ×2 IMPLANT
GLOVE SURG ORTHO LTX SZ9 (GLOVE) ×2 IMPLANT
GLOVE SURG UNDER POLY LF SZ9 (GLOVE) ×2 IMPLANT
GOWN STRL REUS W/ TWL XL LVL3 (GOWN DISPOSABLE) ×3 IMPLANT
GOWN STRL REUS W/TWL XL LVL3 (GOWN DISPOSABLE) ×6
GUIDEWIRE TYB 2X9 (WIRE) ×4 IMPLANT
KIT BASIN OR (CUSTOM PROCEDURE TRAY) ×2 IMPLANT
KIT STAPLE ARCUS 20X17 STRL (Staple) ×2 IMPLANT
KIT TURNOVER KIT B (KITS) ×2 IMPLANT
MANIFOLD NEPTUNE II (INSTRUMENTS) ×2 IMPLANT
NS IRRIG 1000ML POUR BTL (IV SOLUTION) ×2 IMPLANT
PACK ORTHO EXTREMITY (CUSTOM PROCEDURE TRAY) ×2 IMPLANT
PAD ABD 8X10 STRL (GAUZE/BANDAGES/DRESSINGS) ×2 IMPLANT
PAD ARMBOARD 7.5X6 YLW CONV (MISCELLANEOUS) ×4 IMPLANT
PAD CAST 4YDX4 CTTN HI CHSV (CAST SUPPLIES) ×1 IMPLANT
PADDING CAST COTTON 4X4 STRL (CAST SUPPLIES) ×2
PUTTY DBX 5CC (Putty) ×2 IMPLANT
SCREW CANN HDLS SHT 6.5X65 (Screw) ×2 IMPLANT
SCREW CANN HDLS SHT 6.5X80 (Screw) ×2 IMPLANT
SPONGE T-LAP 18X18 ~~LOC~~+RFID (SPONGE) ×2 IMPLANT
STAPLE ASSEMBLY ARCUS 20X17 (Staple) ×2 IMPLANT
SUCTION FRAZIER HANDLE 10FR (MISCELLANEOUS) ×2
SUCTION TUBE FRAZIER 10FR DISP (MISCELLANEOUS) ×1 IMPLANT
SUT ETHILON 2 0 PSLX (SUTURE) ×6 IMPLANT
TOWEL GREEN STERILE (TOWEL DISPOSABLE) ×2 IMPLANT
TOWEL GREEN STERILE FF (TOWEL DISPOSABLE) ×2 IMPLANT
TUBE CONNECTING 12X1/4 (SUCTIONS) ×2 IMPLANT
WATER STERILE IRR 1000ML POUR (IV SOLUTION) ×2 IMPLANT

## 2020-09-13 NOTE — Anesthesia Postprocedure Evaluation (Signed)
Anesthesia Post Note  Patient: [name]  Procedure(s) Performed: RIGHT TALONAVICULAR AND SUBTALAR FUSION (Right: Foot)     Patient location during evaluation: PACU Anesthesia Type: Regional Level of consciousness: awake and alert Pain management: pain level controlled Vital Signs Assessment: post-procedure vital signs reviewed and stable Respiratory status: spontaneous breathing, nonlabored ventilation, respiratory function stable and patient connected to nasal cannula oxygen Cardiovascular status: stable and blood pressure returned to baseline Postop Assessment: no apparent nausea or vomiting Anesthetic complications: no   No notable events documented.  Last Vitals:  Vitals:   09/13/20 1145 09/13/20 1239  BP: 103/72 97/60  Pulse: (!) 40 (!) 43  Resp: 12 14  Temp: (!) 36.1 C (!) 36.3 C  SpO2: 100% 100%    Last Pain:  Vitals:   09/13/20 1239  TempSrc: Oral  PainSc:                  [name]

## 2020-09-13 NOTE — Telephone Encounter (Signed)
Spoke with patients nurse. She is improving and they dont really have any concerns at this time. They just had to let provider know

## 2020-09-13 NOTE — Evaluation (Signed)
Physical Therapy Evaluation Patient Details Name: [name] MRN: [id number] DOB: Jun 10, 1950 Today's Date: 09/13/2020   History of Present Illness  The pt is a 70 yo female presenting 8/17 for R talonavicular and subtalar fusion due to posterior tibial tendinitis. PMH includes: colon cancer, COPD, R ankle fx (2011), HTN, LBP, and OSA on cpap.   Clinical Impression  Pt in bed upon arrival of PT, agreeable to evaluation at this time. Prior to admission the pt was independent with use of a SPC for mobility in the home, but was independent with all other ADLs and IADLs. The pt now presents with limitations in functional mobility, strength, power, stability, and endurance due to above dx, and will continue to benefit from skilled PT to address these deficits. The pt was able to complete bed mobility with minA only to complete trunk elevation, but required min-modA to manage initial sit-stand transfers as well as pivot to recliner with RLE maintaining TDWB. The pt was unable to generate hops for ambulation at this time, will require WC due to deficits in mobility endurance at this time. The pt is eager to return home, but currently has 6 steps to enter and is unable at present to manage 6 steps with TDWB RLE. Will continue to benefit from skilled PT to progress OOB mobility, endurance, power, and to train the pt and her family on how to manage stairs with DME (such as a WC).      Follow Up Recommendations Home health PT;Supervision for mobility/OOB    Equipment Recommendations  Rolling walker with 5" wheels;3in1 (PT);Wheelchair (measurements PT);Wheelchair cushion (measurements PT)    Recommendations for Other Services       Precautions / Restrictions Precautions Precautions: Fall Precaution Comments: incontinent of urine on standing Required Braces or Orthoses: Other Brace Other Brace: cam walker R leg Restrictions Weight Bearing Restrictions: Yes RLE Weight Bearing: Touchdown weight  bearing      Mobility  Bed Mobility Overal bed mobility: Needs Assistance Bed Mobility: Supine to Sit     Supine to sit: Min assist     General bed mobility comments: minA to complete trunk elevation, pt moving BLE to EOB without physical assist    Transfers Overall transfer level: Needs assistance Equipment used: Rolling walker (2 wheeled) Transfers: Sit to/from [facility] Sit to Stand: Min assist;From elevated surface Stand pivot transfers: Mod assist       General transfer comment: minA to power up from elevated EOB. unable to maintain TDWB while hopping, able to use small pivots with LLE to reach recliner  Ambulation/Gait             General Gait Details: pt unable to achieve hopping at this time      Balance Overall balance assessment: Needs assistance Sitting-balance support: No upper extremity supported;Feet supported Sitting balance-Leahy Scale: Good     Standing balance support: Bilateral upper extremity supported;During functional activity Standing balance-Leahy Scale: Poor Standing balance comment: reliant on BUE support to maintain TDWB RLE                             Pertinent Vitals/Pain Pain Assessment: 0-10 Pain Score: 6  Pain Location: R ankle Pain Descriptors / Indicators: Aching Pain Intervention(s): Limited activity within patient's tolerance;Monitored during session;Repositioned    Home Living Family/patient expects to be discharged to:: Private residence Living Arrangements: Children;Spouse/significant other Available Help at Discharge: Family;Available 24 hours/day Type of Home: [place]  Access: Stairs to enter Entrance Stairs-Rails: Right Entrance Stairs-Number of Steps: 6 Home Layout: Multi-level;Able to live on main level with bedroom/bathroom Home Equipment: Cane - single point      Prior Function Level of Independence: Independent with assistive device(s)         Comments: use of cane  to walk around in the home     Hand Dominance   Dominant Hand: Right    Extremity/Trunk Assessment   Upper Extremity Assessment Upper Extremity Assessment: Overall WFL for tasks assessed    Lower Extremity Assessment Lower Extremity Assessment: Generalized weakness (RLE not assessed fully due to TDWB, able to move at hip against gravity)    Cervical / Trunk Assessment Cervical / Trunk Assessment: Other exceptions Cervical / Trunk Exceptions: large body habitus  Communication   Communication: No difficulties  Cognition Arousal/Alertness: Awake/alert Behavior During Therapy: WFL for tasks assessed/performed Overall Cognitive Status: Within Functional Limits for tasks assessed                                 General Comments: pt pleasant, needing repeated cues to maintain TDWB      General Comments General comments (skin integrity, edema, etc.): VSS on RA    Exercises     Assessment/Plan    PT Assessment Patient needs continued PT services  PT Problem List Decreased strength;Decreased range of motion;Decreased activity tolerance;Decreased balance;Decreased mobility       PT Treatment Interventions DME instruction;Stair training;Gait training;Functional mobility training;Therapeutic activities;Balance training;Therapeutic exercise;Patient/family education    PT Goals (Current goals can be found in the Care Plan section)  Acute Rehab PT Goals Patient Stated Goal: return home, heal completely PT Goal Formulation: With patient Time For Goal Achievement: 09/27/20 Potential to Achieve Goals: Good    Frequency Min 5X/week   Barriers to discharge Inaccessible home environment pt with 6 steps to enter and is TDWB       AM-PAC PT "6 Clicks" Mobility  Outcome Measure Help needed turning from your back to your side while in a flat bed without using bedrails?: A Little Help needed moving from lying on your back to sitting on the side of a flat bed without  using bedrails?: A Little Help needed moving to and from a bed to a chair (including a wheelchair)?: A Little Help needed standing up from a chair using your arms (e.g., wheelchair or bedside chair)?: A Little Help needed to walk in hospital room?: A Lot Help needed climbing 3-5 steps with a railing? : Total 6 Click Score: 15    End of Session Equipment Utilized During Treatment: Gait belt;Other (comment) (cam walker boot) Activity Tolerance: Patient tolerated treatment well Patient left: in chair;with call bell/phone within reach;with chair alarm set Nurse Communication: Mobility status PT Visit Diagnosis: Other abnormalities of gait and mobility (R26.89);Muscle weakness (generalized) (M62.81);Difficulty in walking, not elsewhere classified (R26.2)    Time: [id number] PT Time Calculation (min) (ACUTE ONLY): 41 min   Charges:   PT Evaluation $PT Eval Low Complexity: 1 Low PT Treatments $Therapeutic Activity: 23-37 mins        Inocencio Homes, PT, DPT   Acute Rehabilitation Department Pager #: [phone]  Sandra Cockayne 09/13/2020, 6:06 PM

## 2020-09-13 NOTE — Anesthesia Procedure Notes (Signed)
Anesthesia Regional Block: Popliteal block   Pre-Anesthetic Checklist: , timeout performed,  Correct Patient, Correct Site, Correct Laterality,  Correct Procedure, Correct Position, site marked,  Risks and benefits discussed,  Surgical consent,  Pre-op evaluation,  At surgeon's request and post-op pain management  Laterality: Right  Prep: chloraprep       Needles:  Injection technique: Single-shot  Needle Type: Echogenic Stimulator Needle     Needle Length: 9cm  Needle Gauge: 21     Additional Needles:   Procedures:,,,, ultrasound used (permanent image in chart),,    Narrative:  Start time: 09/13/2020 8:15 AM End time: 09/13/2020 8:20 AM Injection made incrementally with aspirations every 5 mL.  Performed by: Personally  Anesthesiologist: [name], MD  Additional Notes: Patient tolerated the procedure well. Local anesthetic introduced in an incremental fashion under minimal resistance after negative aspirations. No paresthesias were elicited. After completion of the procedure, no acute issues were identified and patient continued to be monitored by RN.
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
## 2020-09-13 NOTE — Interval H&P Note (Signed)
History and Physical Interval Note:  09/13/2020 6:55 AM  [name]  has presented today for surgery, with the diagnosis of Right Posterior Tibial Tendon Insufficiencey.  The various methods of treatment have been discussed with the patient and family. After consideration of risks, benefits and other options for treatment, the patient has consented to  Procedure(s): RIGHT TALONAVICULAR AND SUBTALAR FUSION (Right) as a surgical intervention.  The patient's history has been reviewed, patient examined, no change in status, stable for surgery.  I have reviewed the patient's chart and labs.  Questions were answered to the patient's satisfaction.     Newt Minion

## 2020-09-13 NOTE — Anesthesia Procedure Notes (Signed)
Procedure Name: MAC Date/Time: 09/13/2020 8:35 AM Performed by: Claris Che, CRNA Pre-anesthesia Checklist: Patient identified, Emergency Drugs available, Suction available, Patient being monitored and Timeout performed Patient Re-evaluated:Patient Re-evaluated prior to induction Oxygen Delivery Method: Simple face mask Ventilation: Oral airway inserted - appropriate to patient size Placement Confirmation: CO2 detector Dental Injury: Teeth and Oropharynx as per pre-operative assessment

## 2020-09-13 NOTE — Telephone Encounter (Signed)
The hospital called and wanted to update Dr. Sharol Given that the patients meuse is yellow.   And her pulse is 43  Her Bp is 100/58  She stated that her pulse as been 43 since surgery.

## 2020-09-13 NOTE — Op Note (Signed)
09/13/2020  9:48 AM  PATIENT:  [name]    PRE-OPERATIVE DIAGNOSIS:  Right Posterior Tibial Tendon Insufficiencey  POST-OPERATIVE DIAGNOSIS:  Same  PROCEDURE:  RIGHT TALONAVICULAR AND SUBTALAR FUSION  SURGEON:  Newt Minion, MD  PHYSICIAN ASSISTANT:None ANESTHESIA:   General  PREOPERATIVE INDICATIONS:  [name] is a  70 y.o. female with a diagnosis of Right Posterior Tibial Tendon Insufficiencey who failed conservative measures and elected for surgical management.    The risks benefits and alternatives were discussed with the patient preoperatively including but not limited to the risks of infection, bleeding, nerve injury, cardiopulmonary complications, the need for revision surgery, among others, and the patient was willing to proceed.  OPERATIVE IMPLANTS: 6.5 headless cannulated screws x2, staples x2. Demineralized bone matrix putty 5 cc.  '@ENCIMAGES'$ @  OPERATIVE FINDINGS: C-arm fluoroscopy verified reduction of the internal fixation.  OPERATIVE PROCEDURE: Patient was brought the operating room after undergoing a regional block.  After adequate levels anesthesia were obtained patient's right lower extremity was prepped using DuraPrep draped into a sterile field a timeout was called.  An Esmarch was placed around the ankle for tourniquet control.  An oblique incision was made over the sinus Tarsi this was carried down to the subtalar joint retractors were placed to protect the peroneal tendons.  The bur was used to debride the subtalar joint of articular cartilage.  This was irrigated cleansed and packed with 3 cc of demineralized bone matrix putty.  Attention was then focused on the talonavicular joint.  A dorsal incision was made this was carried down through the interval of the anterior tibial tendon and the EHL tendon.  A bur was used to debride the articular cartilage from the talonavicular joint this was irrigated cleansed packed with 2 cc of demineralized bone  matrix putty.  The foot was reduced at 90 degrees of dorsiflexion and 2 staples were placed in the talonavicular joint C-arm fluoroscopy verified reduction.  Attention was then focused on the subtalar joint.  2 K wires were placed across the subtalar joint from the calcaneus C-arm fluoroscopy verified alignment and 2 headless cannulated screws were used to reduce and compress the subtalar joint.  C-arm fluoroscopy verified alignment.  The incisions were closed using 2-0 nylon a sterile dressing was applied patient was taken the PACU in stable condition   DISCHARGE PLANNING:  Antibiotic duration: 24 hours IV antibiotics  Weightbearing: Touchdown weightbearing on the right  Pain medication: Opioid pathway  Dressing care/ Wound VAC: Dry dressing change as needed  Ambulatory devices: Walker  Discharge to: Anticipate discharge to home tomorrow.  Follow-up: In the office 1 week post operative.

## 2020-09-13 NOTE — Anesthesia Preprocedure Evaluation (Addendum)
Anesthesia Evaluation  Patient identified by MRN, date of birth, ID band Patient awake    Reviewed: Allergy & Precautions, NPO status , Patient's Chart, lab work & pertinent test results  Airway Mallampati: II  TM Distance: >3 FB Neck ROM: Full    Dental  (+) Teeth Intact, Dental Advisory Given   Pulmonary asthma , sleep apnea , COPD,    breath sounds clear to auscultation       Cardiovascular hypertension, Pt. on medications  Rhythm:Regular Rate:Normal     Neuro/Psych PSYCHIATRIC DISORDERS Depression    GI/Hepatic Neg liver ROS, GERD  Medicated,  Endo/Other  negative endocrine ROS  Renal/GU negative Renal ROS     Musculoskeletal  (+) Arthritis ,   Abdominal Normal abdominal exam  (+)   Peds  Hematology negative hematology ROS (+)   Anesthesia Other Findings   Reproductive/Obstetrics                            Anesthesia Physical Anesthesia Plan  ASA: 2  Anesthesia Plan: Regional and MAC   Post-op Pain Management:    Induction: Intravenous  PONV Risk Score and Plan: 3 and Ondansetron, Propofol infusion, Treatment may vary due to age or medical condition and Dexamethasone  Airway Management Planned: Simple Face Mask and Natural Airway  Additional Equipment: None  Intra-op Plan:   Post-operative Plan:   Informed Consent: I have reviewed the patients History and Physical, chart, labs and discussed the procedure including the risks, benefits and alternatives for the proposed anesthesia with the patient or authorized representative who has indicated his/her understanding and acceptance.     Dental advisory given  Plan Discussed with: CRNA  Anesthesia Plan Comments:        Anesthesia Quick Evaluation

## 2020-09-13 NOTE — Progress Notes (Signed)
Orthopedic Tech Progress Note Patient Details:  [name] [birth year] [id number]  Tried to apply CAM WALKER while patient was in PACU .Marland Kitchen THERAPY applied for me instead  Ortho Devices Type of Ortho Device: CAM walker Ortho Device/Splint Location: RLE Ortho Device/Splint Interventions: Ordered, Application, Adjustment, Other (comment)   Post Interventions Patient Tolerated: Well Instructions Provided: [facility] [place] 09/13/2020, 3:40 PM

## 2020-09-13 NOTE — Progress Notes (Signed)
RT set up CPAP with patient's mask. CPAP is at bedside. Patient stated she will place herself on CPAP when ready. RT instructed patient to have RN call RT if assistance is needed. RT will monitor.

## 2020-09-13 NOTE — Telephone Encounter (Signed)
Please see below.

## 2020-09-13 NOTE — Anesthesia Procedure Notes (Signed)
Anesthesia Regional Block: Adductor canal block   Pre-Anesthetic Checklist: , timeout performed,  Correct Patient, Correct Site, Correct Laterality,  Correct Procedure, Correct Position, site marked,  Risks and benefits discussed,  Surgical consent,  Pre-op evaluation,  At surgeon's request and post-op pain management  Laterality: Right  Prep: chloraprep       Needles:  Injection technique: Single-shot  Needle Type: Echogenic Stimulator Needle     Needle Length: 9cm  Needle Gauge: 21     Additional Needles:   Procedures:,,,, ultrasound used (permanent image in chart),,    Narrative:  Start time: 09/13/2020 8:15 AM End time: 09/13/2020 8:20 AM Injection made incrementally with aspirations every 5 mL.  Performed by: Personally  Anesthesiologist: [name], MD  Additional Notes: Patient tolerated the procedure well. Local anesthetic introduced in an incremental fashion under minimal resistance after negative aspirations. No paresthesias were elicited. After completion of the procedure, no acute issues were identified and patient continued to be monitored by RN.

## 2020-09-13 NOTE — Transfer of Care (Signed)
Immediate Anesthesia Transfer of Care Note  Patient: [name]  Procedure(s) Performed: RIGHT TALONAVICULAR AND SUBTALAR FUSION (Right: Foot)  Patient Location: PACU  Anesthesia Type:MAC combined with regional for post-op pain  Level of Consciousness: oriented, drowsy, patient cooperative and responds to stimulation  Airway & Oxygen Therapy: Patient Spontanous Breathing  Post-op Assessment: Report given to RN, Post -op Vital signs reviewed and stable and Patient moving all extremities X 4  Post vital signs: Reviewed and stable  Last Vitals:  Vitals Value Taken Time  BP 96/64 09/13/20 0944  Temp    Pulse 69 09/13/20 0946  Resp 20 09/13/20 0946  SpO2 99 % 09/13/20 0946  Vitals shown include unvalidated device data.  Last Pain:  Vitals:   09/13/20 0725  TempSrc:   PainSc: 7       Patients Stated Pain Goal: 4 (06/07/00 1117)  Complications: No notable events documented.

## 2020-09-14 ENCOUNTER — Encounter (HOSPITAL_COMMUNITY): Payer: Self-pay | Admitting: Orthopedic Surgery

## 2020-09-14 ENCOUNTER — Ambulatory Visit: Payer: Medicare PPO | Admitting: Oncology

## 2020-09-14 DIAGNOSIS — J45909 Unspecified asthma, uncomplicated: Secondary | ICD-10-CM | POA: Diagnosis not present

## 2020-09-14 DIAGNOSIS — Z85038 Personal history of other malignant neoplasm of large intestine: Secondary | ICD-10-CM | POA: Diagnosis not present

## 2020-09-14 DIAGNOSIS — M79671 Pain in right foot: Secondary | ICD-10-CM | POA: Diagnosis not present

## 2020-09-14 DIAGNOSIS — Z79899 Other long term (current) drug therapy: Secondary | ICD-10-CM | POA: Diagnosis not present

## 2020-09-14 DIAGNOSIS — I10 Essential (primary) hypertension: Secondary | ICD-10-CM | POA: Diagnosis not present

## 2020-09-14 DIAGNOSIS — G4733 Obstructive sleep apnea (adult) (pediatric): Secondary | ICD-10-CM | POA: Diagnosis not present

## 2020-09-14 DIAGNOSIS — J449 Chronic obstructive pulmonary disease, unspecified: Secondary | ICD-10-CM | POA: Diagnosis not present

## 2020-09-14 DIAGNOSIS — Z7982 Long term (current) use of aspirin: Secondary | ICD-10-CM | POA: Diagnosis not present

## 2020-09-14 DIAGNOSIS — M19071 Primary osteoarthritis, right ankle and foot: Secondary | ICD-10-CM | POA: Diagnosis not present

## 2020-09-14 MED ORDER — OXYCODONE HCL 5 MG PO TABS: 5.0000 mg | ORAL_TABLET | ORAL | 0 refills | Status: DC | PRN

## 2020-09-14 NOTE — Progress Notes (Signed)
[name] to be D/C'd Home with home health per MD order.  Discussed with the patient and all questions fully answered.  VSS, Skin clean, dry and intact without evidence of skin break down, no evidence of skin tears noted. IV catheter discontinued intact. Site without signs and symptoms of complications. Dressing and pressure applied.  An After Visit Summary was printed and given to the patient. Patient prescription sent to pharmacy and patient received rollator.  D/c education completed with patient/family including follow up instructions, medication list, d/c activities limitations if indicated, with other d/c instructions as indicated by MD - patient able to verbalize understanding, all questions fully answered.   Patient instructed to return to ED, call 911, or call MD for any changes in condition.   Patient escorted via [place], and D/C home via private auto.  Manuella Ghazi 09/14/2020 4:57 PM

## 2020-09-14 NOTE — Discharge Summary (Signed)
Discharge Diagnoses:  Active Problems:   Foot pain, right   Arthritis of ankle or foot, degenerative, right   Surgeries: Procedure(s): RIGHT TALONAVICULAR AND SUBTALAR FUSION on 09/13/2020    Consultants:   Discharged Condition: Improved  Hospital Course: [name] is an 70 y.o. female who was admitted 09/13/2020 with a chief complaint of right foot pain, with a final diagnosis of Right Posterior Tibial Tendon Insufficiencey.  Patient was brought to the operating room on 09/13/2020 and underwent Procedure(s): RIGHT TALONAVICULAR AND SUBTALAR FUSION.    Patient was given perioperative antibiotics:  Anti-infectives (From admission, onward)    Start     Dose/Rate Route Frequency Ordered Stop   09/13/20 1315  ceFAZolin (ANCEF) IVPB 2g/100 mL premix        2 g 200 mL/hr over 30 Minutes Intravenous Every 6 hours 09/13/20 1226 09/14/20 0504   09/13/20 0700  vancomycin (VANCOCIN) IVPB 1000 mg/200 mL premix        1,000 mg 200 mL/hr over 60 Minutes Intravenous To Surgery 09/13/20 0649 09/13/20 0905   09/13/20 0700  ceFAZolin (ANCEF) IVPB 2g/100 mL premix  Status:  Discontinued        2 g 200 mL/hr over 30 Minutes Intravenous On call to O.R. 09/13/20 [id number] 09/13/20 1213     .  Patient was given sequential compression devices, early ambulation, and aspirin for DVT prophylaxis.  Recent vital signs: Patient Vitals for the past 24 hrs:  BP Temp Temp src Pulse Resp SpO2  09/14/20 0525 130/84 98 F (36.7 C) Oral (!) 58 16 92 %  09/13/20 2329 (!) 104/55 98.3 F (36.8 C) Oral (!) 45 16 99 %  09/13/20 2120 -- -- -- 70 16 98 %  09/13/20 2025 123/80 (!) 97.4 F (36.3 C) -- 68 17 99 %  09/13/20 1649 104/68 (!) 97.5 F (36.4 C) Oral (!) 50 16 100 %  09/13/20 1449 (!) 100/58 (!) 97.5 F (36.4 C) -- (!) 43 16 98 %  09/13/20 1239 97/60 (!) 97.4 F (36.3 C) Oral (!) 43 14 100 %  09/13/20 1145 103/72 (!) 97 F (36.1 C) -- (!) 40 12 100 %  09/13/20 1115 102/83 -- -- 60 18 100 %  09/13/20  1100 96/65 -- -- (!) 43 (!) 23 100 %  09/13/20 1045 97/64 -- -- (!) 43 (!) 22 100 %  09/13/20 1030 95/64 -- -- (!) 44 17 100 %  09/13/20 1015 93/64 -- -- (!) 48 19 99 %  09/13/20 1000 92/64 -- -- (!) 54 11 94 %  09/13/20 0945 96/64 97.7 F (36.5 C) -- 65 14 100 %  .  Recent laboratory studies: DG MINI C-ARM IMAGE ONLY  Result Date: 09/13/2020 There is no interpretation for this exam.  This order is for images obtained during a surgical procedure.  Please See "Surgeries" Tab for more information regarding the procedure.    Discharge Medications:   Allergies as of 09/14/2020   No Known Allergies      Medication List     STOP taking these medications    benzonatate 100 MG capsule Commonly known as: TESSALON   levocetirizine 5 MG tablet Commonly known as: XYZAL   meloxicam 15 MG tablet Commonly known as: Mobic   methocarbamol 500 MG tablet Commonly known as: ROBAXIN   traMADol 50 MG tablet Commonly known as: ULTRAM       TAKE these medications    albuterol (2.5 MG/3ML) 0.083% nebulizer solution Commonly known as:  PROVENTIL Take 3 mLs (2.5 mg total) by nebulization every 4 (four) hours as needed for wheezing or shortness of breath.   albuterol 108 (90 Base) MCG/ACT inhaler Commonly known as: VENTOLIN HFA INHALE TWO PUFFS BY MOUTH EVERY 6 HOURS AS NEEDED FOR  WHEEZING/SHORTNESS  OF  BREATH   aspirin EC 81 MG tablet Take 81 mg by mouth in the morning. Swallow whole.   budesonide-formoterol 160-4.5 MCG/ACT inhaler Commonly known as: Symbicort Inhale 2 puffs into the lungs 2 (two) times daily. Rinse, gargle and spit out after use   cetirizine 10 MG tablet Commonly known as: ZYRTEC Take 1 tablet (10 mg total) by mouth daily. What changed: when to take this   EPINEPHrine 0.3 mg/0.3 mL Soaj injection Commonly known as: EPI-PEN Use as directed for severe allergic reaction What changed:  how much to take how to take this when to take this reasons to take this    famotidine 40 MG tablet Commonly known as: PEPCID Take 1 tablet (40 mg total) by mouth daily. What changed: when to take this   ferrous sulfate 325 (65 FE) MG tablet Take 325 mg by mouth in the morning and at bedtime.   fluticasone 50 MCG/ACT nasal spray Commonly known as: FLONASE 1-2 sprays each nostril once daily What changed:  how much to take how to take this when to take this reasons to take this additional instructions   gabapentin 300 MG capsule Commonly known as: NEURONTIN TAKE 1 CAPSULE (300 MG TOTAL) BY MOUTH 2 (TWO) TIMES DAILY. What changed:  when to take this reasons to take this   hydrochlorothiazide 25 MG tablet Commonly known as: HYDRODIURIL TAKE 1 TABLET EVERY DAY   ipratropium 0.06 % nasal spray Commonly known as: ATROVENT Place 1 spray into both nostrils 4 (four) times daily. What changed:  when to take this reasons to take this   irbesartan 150 MG tablet Commonly known as: AVAPRO TAKE 1 TABLET EVERY DAY   montelukast 10 MG tablet Commonly known as: SINGULAIR TAKE 1 TABLET ONE TIME DAILY WITH BREAKFAST What changed:  how much to take how to take this when to take this   omeprazole 20 MG capsule Commonly known as: PRILOSEC Take 1 capsule (20 mg total) by mouth daily.   oxyCODONE 5 MG immediate release tablet Commonly known as: Oxy IR/ROXICODONE Take 1 tablet (5 mg total) by mouth every 4 (four) hours as needed for moderate pain (pain score 4-6).   potassium chloride 10 MEQ tablet Commonly known as: KLOR-CON TAKE 1 TABLET EVERY DAY   Vitamin B-12 1000 MCG Subl Place 1 tablet (1,000 mcg total) under the tongue daily.   Vitamin D 1000 units capsule Take 1,000 Units by mouth every morning.        Diagnostic Studies: CT CHEST ABDOMEN PELVIS W CONTRAST  Result Date: 09/11/2020 CLINICAL DATA:  Gastrointestinal cancer. Adenocarcinoma transverse colon. Status post partial colectomy. EXAM: CT CHEST, ABDOMEN, AND PELVIS WITH CONTRAST  TECHNIQUE: Multidetector CT imaging of the chest, abdomen and pelvis was performed following the standard protocol during bolus administration of intravenous contrast. CONTRAST:  50m OMNIPAQUE IOHEXOL 350 MG/ML SOLN COMPARISON:  CT 03/09/2018 FINDINGS: CT CHEST FINDINGS Cardiovascular: No significant vascular findings. Normal heart size. No pericardial effusion. Mediastinum/Nodes: No suspicious pulmonary nodules. Normal pleural. Airways normal. Lungs/Pleura: No suspicious pulmonary nodules. Normal pleural. Airways normal. Musculoskeletal: No aggressive osseous lesion. CT ABDOMEN AND PELVIS FINDINGS Hepatobiliary: No focal hepatic lesion. Postcholecystectomy. No biliary dilatation. Pancreas: Pancreas is normal. No  ductal dilatation. No pancreatic inflammation. Spleen: Normal spleen Adrenals/urinary tract: Adrenal glands and kidneys are normal. The ureters and bladder normal. Stomach/Bowel: Stomach, duodenum and small-bowel normal. Terminal ileum and cecum normal. RIGHT abdominal wall hernia contains a short segment of nonobstructed small bowel (image 78/2). Ascending colon is normal. Post partial LEFT hemicolectomy. Distal colon leading up to the anus is unremarkable. No obstruction or mass. Vascular/Lymphatic: Abdominal aorta is normal caliber. There is no retroperitoneal or periportal lymphadenopathy. No pelvic lymphadenopathy. Reproductive: Post hysterectomy.  Adnexa unremarkable Other: No peritoneal nodularity. Musculoskeletal: No aggressive osseous lesion. IMPRESSION: Chest Impression: No evidence of thoracic metastasis Abdomen / Pelvis Impression: 1. Post partial LEFT colectomy.  No evidence of local recurrence. 2. No evidence of metastatic disease in the abdomen pelvis. 3. No skeletal metastasis. 4. RIGHT abdominal wall hernia contains loop of nonobstructed small bowel Electronically Signed   By: Suzy Bouchard M.D.   On: 09/11/2020 15:21   DG MINI C-ARM IMAGE ONLY  Result Date: 09/13/2020 There is no  interpretation for this exam.  This order is for images obtained during a surgical procedure.  Please See "Surgeries" Tab for more information regarding the procedure.    Patient benefited maximally from their hospital stay and there were no complications.     Disposition: Discharge disposition: 01-Home or Self Care      Discharge Instructions     Call MD / Call 911   Complete by: As directed    If you experience chest pain or shortness of breath, CALL 911 and be transported to the hospital emergency room.  If you develope a fever above 101 F, pus (white drainage) or increased drainage or redness at the wound, or calf pain, call your surgeon's office.   Constipation Prevention   Complete by: As directed    Drink plenty of fluids.  Prune juice may be helpful.  You may use a stool softener, such as Colace (over the counter) 100 mg twice a day.  Use MiraLax (over the counter) for constipation as needed.   Diet - low sodium heart healthy   Complete by: As directed    Increase activity slowly as tolerated   Complete by: As directed    Post-operative opioid taper instructions:   Complete by: As directed    POST-OPERATIVE OPIOID TAPER INSTRUCTIONS: It is important to wean off of your opioid medication as soon as possible. If you do not need pain medication after your surgery it is ok to stop day one. Opioids include: Codeine, Hydrocodone(Norco, Vicodin), Oxycodone(Percocet, oxycontin) and hydromorphone amongst others.  Long term and even short term use of opiods can cause: Increased pain response Dependence Constipation Depression Respiratory depression And more.  Withdrawal symptoms can include Flu like symptoms Nausea, vomiting And more Techniques to manage these symptoms Hydrate well Eat regular healthy meals Stay active Use relaxation techniques(deep breathing, meditating, yoga) Do Not substitute Alcohol to help with tapering If you have been on opioids for less than two  weeks and do not have pain than it is ok to stop all together.  Plan to wean off of opioids This plan should start within one week post op of your joint replacement. Maintain the same interval or time between taking each dose and first decrease the dose.  Cut the total daily intake of opioids by one tablet each day Next start to increase the time between doses. The last dose that should be eliminated is the evening dose.  Follow-up Information     Uvaldo Rybacki, Bevely Palmer, [place] Follow up in 1 week(s).   Specialty: Orthopedic Surgery Contact information: [address] [place] [address] [phone]                  Signed: Bevely Palmer Jere Vanburen 09/14/2020, 8:07 AM

## 2020-09-14 NOTE — Progress Notes (Signed)
Physical Therapy Treatment Patient Details Name: [name] MRN: [id number] DOB: 1950-09-21 Today's Date: 09/14/2020    History of Present Illness The pt is a 70 yo female presenting 8/17 for R talonavicular and subtalar fusion due to posterior tibial tendinitis. PMH includes: colon cancer, COPD, R ankle fx (2011), HTN, LBP, and OSA on cpap.    PT Comments    Pt was seen for progression of mobility on RW to stand and hop/slide steps with dense WB limitations cued.  Pt has permission to [facility] on RLE in cam boot at home in particular situations, but is not going to be able to maintain TDWB regularly.  Follow acutely for goals as outlined for increased endurance and safety of gait with protection of R ankle.    Follow Up Recommendations  Home health PT;Supervision for mobility/OOB     Equipment Recommendations  Rolling walker with 5" wheels;3in1 (PT);Wheelchair (measurements PT);Wheelchair cushion (measurements PT)    Recommendations for Other Services       Precautions / Restrictions Precautions Precautions: Fall Precaution Comments: incontinent of urine on standing Required Braces or Orthoses: Other Brace Other Brace: cam walker R leg Restrictions Weight Bearing Restrictions: Yes RLE Weight Bearing: Touchdown weight bearing Other Position/Activity Restrictions: RLE x WBAT for stairs and short walks in her home to BR and meals    Mobility  Bed Mobility Overal bed mobility: Needs Assistance             General bed mobility comments: up in chair with BLE's elevated    Transfers Overall transfer level: Needs assistance Equipment used: Rolling walker (2 wheeled);1 person hand held assist Transfers: Sit to/from Stand Sit to Stand: Min assist         General transfer comment: min assist with appropriate hand placement  Ambulation/Gait Ambulation/Gait assistance: Min assist Gait Distance (Feet): 6 Feet Assistive device: Rolling walker (2 wheeled);1 person  hand held assist Gait Pattern/deviations: Step-through pattern;Decreased stride length;Wide base of support;Decreased weight shift to right Gait velocity: reduced Gait velocity interpretation: <1.8 ft/sec, indicate of risk for recurrent falls General Gait Details: pt requires continual prompts to keep RLE off the floor and limited to TDWB   Stairs             Wheelchair Mobility    Modified Rankin (Stroke Patients Only)       Balance Overall balance assessment: Needs assistance Sitting-balance support: Feet supported Sitting balance-Leahy Scale: Good     Standing balance support: Bilateral upper extremity supported;During functional activity Standing balance-Leahy Scale: Poor                              Cognition Arousal/Alertness: Awake/alert Behavior During Therapy: WFL for tasks assessed/performed Overall Cognitive Status: Within Functional Limits for tasks assessed                                 General Comments: frequent TDWB cues      Exercises General Exercises - Lower Extremity Ankle Circles/Pumps: AROM;Left;5 reps Quad Sets: AROM;10 reps Heel Slides: AROM;Left;10 reps Hip ABduction/ADduction: 10 reps;AAROM;AROM    General Comments General comments (skin integrity, edema, etc.): pt requires reminders and safety prompts to avoid pressure on RLE during gait.  Has newer note from MD about WBAT briefly for stairs and to get to meals, BR;  No clear presentation that pt can reliably otherwise keep wgt off  the RLE during gait      Pertinent Vitals/Pain Pain Assessment: Faces Faces Pain Scale: Hurts a little bit Pain Location: R ankle Pain Descriptors / Indicators: Guarding Pain Intervention(s): Limited activity within patient's tolerance;Monitored during session;Premedicated before session;Repositioned    Home Living                      Prior Function            PT Goals (current goals can now be found in the  care plan section) Acute Rehab PT Goals Patient Stated Goal: go home with family Progress towards PT goals: Progressing toward goals    Frequency    Min 5X/week      PT Plan Current plan remains appropriate    Co-evaluation              AM-PAC PT "6 Clicks" Mobility   Outcome Measure  Help needed turning from your back to your side while in a flat bed without using bedrails?: A Little Help needed moving from lying on your back to sitting on the side of a flat bed without using bedrails?: A Little Help needed moving to and from a bed to a chair (including a wheelchair)?: A Little Help needed standing up from a chair using your arms (e.g., wheelchair or bedside chair)?: A Little Help needed to walk in hospital room?: A Lot Help needed climbing 3-5 steps with a railing? : Total 6 Click Score: 15    End of Session Equipment Utilized During Treatment: Gait belt;Other (comment) Activity Tolerance: Patient tolerated treatment well Patient left: in chair;with call bell/phone within reach;with chair alarm set Nurse Communication: Mobility status PT Visit Diagnosis: Other abnormalities of gait and mobility (R26.89);Muscle weakness (generalized) (M62.81);Difficulty in walking, not elsewhere classified (R26.2)     Time: [id number] PT Time Calculation (min) (ACUTE ONLY): 30 min  Charges:  $Gait Training: 8-22 mins $Therapeutic Exercise: 8-22 mins               Ramond Dial 09/14/2020, 5:06 PM  Mee Hives, PT MS Acute Rehab Dept. Number: [place] and [place]

## 2020-09-14 NOTE — Care Management (Signed)
    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  A cane will not resolve issue with performing activities of daily living. A wheelchair will allow patient to safely perform daily activities. Patient can safely propel the wheelchair in the home or has a caregiver who can provide assistance. Length of need lifetime. Accessories: elevating leg rests (ELRs), wheel locks, extensions and anti-tippers.  Seat and back cushions   09/14/20 1025
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
## 2020-09-14 NOTE — Progress Notes (Signed)
   09/13/20 1649  Assess: MEWS Score  Temp (!) 97.5 F (36.4 C)  BP 104/68  Pulse Rate (!) 50  Resp 16  SpO2 100 %  Assess: MEWS Score  MEWS Temp 0  MEWS Systolic 0  MEWS Pulse 1  MEWS RR 0  MEWS LOC 0  MEWS Score 1  MEWS Score Color Green  Document  Patient Outcome Other (Comment) (stabilized with time)  Progress note created (see row info) Yes

## 2020-09-14 NOTE — Progress Notes (Signed)
Patient ID: [name], female   DOB: [birth year], 70 y.o.   MRN: [id number] Is postoperative day 1 right subtalar and talonavicular fusion.  She has no complaints this morning we will plan for discharge after therapy.  Patient may be weightbearing as tolerated to get up the stairs to her home to get up for meals or going to the bathroom otherwise minimize weightbearing and keep her leg elevated.

## 2020-09-19 DIAGNOSIS — J3081 Allergic rhinitis due to animal (cat) (dog) hair and dander: Secondary | ICD-10-CM | POA: Diagnosis not present

## 2020-09-19 NOTE — Progress Notes (Signed)
VIALS MADE. EXP 09-19-21 

## 2020-09-20 ENCOUNTER — Ambulatory Visit (INDEPENDENT_AMBULATORY_CARE_PROVIDER_SITE_OTHER): Payer: Medicare PPO | Admitting: Family

## 2020-09-20 ENCOUNTER — Encounter: Payer: Self-pay | Admitting: Family

## 2020-09-20 DIAGNOSIS — M19071 Primary osteoarthritis, right ankle and foot: Secondary | ICD-10-CM

## 2020-09-20 DIAGNOSIS — J3089 Other allergic rhinitis: Secondary | ICD-10-CM | POA: Diagnosis not present
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
## 2020-09-22 DIAGNOSIS — I10 Essential (primary) hypertension: Secondary | ICD-10-CM | POA: Diagnosis not present

## 2020-09-22 DIAGNOSIS — J309 Allergic rhinitis, unspecified: Secondary | ICD-10-CM | POA: Diagnosis not present

## 2020-09-22 DIAGNOSIS — Z981 Arthrodesis status: Secondary | ICD-10-CM | POA: Diagnosis not present

## 2020-09-22 DIAGNOSIS — Z4789 Encounter for other orthopedic aftercare: Secondary | ICD-10-CM | POA: Diagnosis not present

## 2020-09-26 DIAGNOSIS — J309 Allergic rhinitis, unspecified: Secondary | ICD-10-CM | POA: Diagnosis not present

## 2020-09-26 DIAGNOSIS — I10 Essential (primary) hypertension: Secondary | ICD-10-CM | POA: Diagnosis not present

## 2020-09-26 DIAGNOSIS — Z4789 Encounter for other orthopedic aftercare: Secondary | ICD-10-CM | POA: Diagnosis not present

## 2020-09-26 DIAGNOSIS — Z981 Arthrodesis status: Secondary | ICD-10-CM | POA: Diagnosis not present

## 2020-09-27 ENCOUNTER — Ambulatory Visit (INDEPENDENT_AMBULATORY_CARE_PROVIDER_SITE_OTHER): Payer: Medicare PPO

## 2020-09-27 ENCOUNTER — Ambulatory Visit (INDEPENDENT_AMBULATORY_CARE_PROVIDER_SITE_OTHER): Payer: Medicare PPO | Admitting: Family

## 2020-09-27 ENCOUNTER — Encounter: Payer: Self-pay | Admitting: Family

## 2020-09-27 DIAGNOSIS — M25571 Pain in right ankle and joints of right foot: Secondary | ICD-10-CM

## 2020-09-27 MED ORDER — OXYCODONE HCL 5 MG PO TABS: 5.0000 mg | ORAL_TABLET | Freq: Four times a day (QID) | ORAL | 0 refills | Status: DC | PRN
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
## 2020-09-28 DIAGNOSIS — I10 Essential (primary) hypertension: Secondary | ICD-10-CM | POA: Diagnosis not present

## 2020-09-28 DIAGNOSIS — Z981 Arthrodesis status: Secondary | ICD-10-CM | POA: Diagnosis not present

## 2020-09-28 DIAGNOSIS — G4733 Obstructive sleep apnea (adult) (pediatric): Secondary | ICD-10-CM | POA: Diagnosis not present

## 2020-09-28 DIAGNOSIS — J309 Allergic rhinitis, unspecified: Secondary | ICD-10-CM | POA: Diagnosis not present

## 2020-09-28 DIAGNOSIS — Z4789 Encounter for other orthopedic aftercare: Secondary | ICD-10-CM | POA: Diagnosis not present

## 2020-10-04 ENCOUNTER — Telehealth: Payer: Self-pay | Admitting: Internal Medicine

## 2020-10-04 DIAGNOSIS — I10 Essential (primary) hypertension: Secondary | ICD-10-CM | POA: Diagnosis not present

## 2020-10-04 DIAGNOSIS — Z4789 Encounter for other orthopedic aftercare: Secondary | ICD-10-CM | POA: Diagnosis not present

## 2020-10-04 DIAGNOSIS — Z981 Arthrodesis status: Secondary | ICD-10-CM | POA: Diagnosis not present

## 2020-10-04 DIAGNOSIS — J309 Allergic rhinitis, unspecified: Secondary | ICD-10-CM | POA: Diagnosis not present

## 2020-10-04 NOTE — Telephone Encounter (Signed)
Order was placed after pt's last appt with CY 09/12/20. Called and spoke with pt letting her know that this had been done.  Pt said that she has received supplies for her cpap but said that she is wanting to receive a new machine.  PCCs, please advise if the order that was placed on 09/12/20 will be okay or if we need to send a new order specifying to have pt's cpap machine replaced?

## 2020-10-05 NOTE — Telephone Encounter (Addendum)
Order was sent to [facility].  I contacted them and spoke to [employer].  She states they contacted [facility] & they said pt last got cpap in 2018 and she will not be eligible for new machine until 07/2021.  I called pt & made her aware.  She thought she got machine in 2017 and she is going to look for her paperwork.  Nothing further needed at this time.

## 2020-10-06 DIAGNOSIS — Z981 Arthrodesis status: Secondary | ICD-10-CM | POA: Diagnosis not present

## 2020-10-06 DIAGNOSIS — J309 Allergic rhinitis, unspecified: Secondary | ICD-10-CM | POA: Diagnosis not present

## 2020-10-06 DIAGNOSIS — Z4789 Encounter for other orthopedic aftercare: Secondary | ICD-10-CM | POA: Diagnosis not present

## 2020-10-06 DIAGNOSIS — I10 Essential (primary) hypertension: Secondary | ICD-10-CM | POA: Diagnosis not present

## 2020-10-10 ENCOUNTER — Ambulatory Visit (INDEPENDENT_AMBULATORY_CARE_PROVIDER_SITE_OTHER): Payer: Medicare PPO | Admitting: Physician Assistant

## 2020-10-10 ENCOUNTER — Other Ambulatory Visit: Payer: Self-pay

## 2020-10-10 ENCOUNTER — Encounter: Payer: Self-pay | Admitting: Orthopedic Surgery

## 2020-10-10 ENCOUNTER — Inpatient Hospital Stay: Payer: Medicare PPO | Attending: Oncology | Admitting: Oncology

## 2020-10-10 ENCOUNTER — Ambulatory Visit (INDEPENDENT_AMBULATORY_CARE_PROVIDER_SITE_OTHER): Payer: Medicare PPO | Admitting: *Deleted

## 2020-10-10 VITALS — BP 117/71 | HR 74 | Temp 98.1°F | Resp 18 | Ht 62.0 in | Wt 202.6 lb

## 2020-10-10 DIAGNOSIS — Z9049 Acquired absence of other specified parts of digestive tract: Secondary | ICD-10-CM | POA: Diagnosis not present

## 2020-10-10 DIAGNOSIS — J309 Allergic rhinitis, unspecified: Secondary | ICD-10-CM | POA: Diagnosis not present

## 2020-10-10 DIAGNOSIS — C184 Malignant neoplasm of transverse colon: Secondary | ICD-10-CM | POA: Diagnosis not present

## 2020-10-10 DIAGNOSIS — J45909 Unspecified asthma, uncomplicated: Secondary | ICD-10-CM | POA: Diagnosis not present

## 2020-10-10 DIAGNOSIS — D649 Anemia, unspecified: Secondary | ICD-10-CM | POA: Insufficient documentation

## 2020-10-10 DIAGNOSIS — Z85038 Personal history of other malignant neoplasm of large intestine: Secondary | ICD-10-CM | POA: Insufficient documentation

## 2020-10-10 DIAGNOSIS — R911 Solitary pulmonary nodule: Secondary | ICD-10-CM | POA: Insufficient documentation

## 2020-10-10 DIAGNOSIS — M19071 Primary osteoarthritis, right ankle and foot: Secondary | ICD-10-CM

## 2020-10-10 DIAGNOSIS — Z9221 Personal history of antineoplastic chemotherapy: Secondary | ICD-10-CM | POA: Diagnosis not present
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
## 2020-10-10 NOTE — Progress Notes (Signed)
[facility] OFFICE PROGRESS NOTE   Diagnosis: Colon cancer  INTERVAL HISTORY:   [name] is here for a scheduled visit.  She underwent a right foot fusion procedure on 09/13/2020.  She continues to recover from surgery.  She is in a boot and ambulates with assistance.  Good appetite.  Objective:  Vital signs in last 24 hours:  Blood pressure 117/71, pulse 74, temperature 98.1 F (36.7 C), temperature source Oral, resp. rate 18, height _0  (1.575 m), weight 202 lb 9.6 oz (91.9 kg), SpO2 100 %.    Lymphatics: No cervical, supraclavicular, axillary, or inguinal nodes Resp: Lungs clear bilaterally, no respiratory distress Cardio: Regular rate and rhythm GI: No mass, no hepatosplenomegaly, nontender   Lab Results:  Lab Results  Component Value Date   WBC 5.4 09/11/2020   HGB 11.6 (L) 09/11/2020   HCT 36.7 09/11/2020   MCV 73.3 (L) 09/11/2020   PLT 249 09/11/2020   NEUTROABS 2.9 09/11/2020    CMP  Lab Results  Component Value Date   NA 140 09/11/2020   K 3.7 09/11/2020   CL 103 09/11/2020   CO2 27 09/11/2020   GLUCOSE 97 09/11/2020   BUN 14 09/11/2020   CREATININE 0.79 09/11/2020   CALCIUM 9.4 09/11/2020   PROT 7.5 09/11/2020   ALBUMIN 3.8 09/11/2020   AST 16 09/11/2020   ALT 15 09/11/2020   ALKPHOS 126 09/11/2020   BILITOT 0.6 09/11/2020   GFRNONAA >60 09/11/2020   GFRAA >60 03/09/2018    Lab Results  Component Value Date   CEA1 1.46 09/11/2020   CEA 1.72 09/11/2020     Medications: I have reviewed the patient's current medications.   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   Presentation to the emergency room 02/02/2013 with a colonic obstruction  secondary to tumor at the splenic flexure.   Baseline CEA on 02/02/2013 less than 0.5.   Initiation of adjuvant Xeloda 04/10/2013.   Cycle 2 adjuvant Xeloda 05/01/2013.   Cycle 3 adjuvant Xeloda 05/22/2013.   Cycle 4 adjuvant Xeloda 06/12/2013.   Cycle 5 adjuvant Xeloda 07/03/2013 (Xeloda dose reduced due to hand foot syndrome).   Cycle 6 adjuvant Xeloda 07/24/2013.   Cycle 7 adjuvant Xeloda 08/14/2013   Cycle 8 adjuvant Xeloda 09/07/2013.  Surveillance colonoscopy 11/26/2016-patent end to side colocolonic anastomosis, characterized by healthy-appearing mucosa.  Examination otherwise normal.  Repeat colonoscopy in 5 years for surveillance.  History of iron deficiency anemia. Recurrent anemia 02/01/2014, improved, Persistent red cell microcytosis Asthma. Hand-foot syndrome secondary to Xeloda. Status post ostomy reversal 11/25/2013. CT 02/01/2014 with no evidence of local tumor recurrence or metastatic disease. New area of masslike thickening and small bowel dilatation at the mid small bowel Status post deep enteroscopy at [facility] on 03/16/2014, negative. Status post capsule endoscopy 03/22/2014, confirmed a small bowel tumor Exploratory laparotomy with resection of a small bowel mass on 04/06/2014 with the pathology confirming invasive adenocarcinoma extending through small bowel wall and involving adjacent loops of adherent small bowel, 2 of 12 lymph nodes positive. History consistent with recurrent colon cancer. Loss of MLH1 and PMS 2, MSI high as was the January 2015 tumor CT abdomen/pelvis 02/03/2015-no evidence of recurrent colon cancer, ventral hernias CT chest/abdomen/pelvis 03/09/2018-no evidence of recurrent or metastatic disease.  Nonspecific  2 mm posterior right upper lobe nodule.  Continued attention on follow-up exams suggested. CT chest 09/13/2019-no evidence of metastatic disease CTs 09/11/2020-no evidence of thoracic metastases, no evidence of metastatic disease in the abdomen or  pelvis          Disposition: Ms. Uncapher remains in clinical remission from colon cancer.  The restaging CTs revealed no evidence of recurrent disease.  She will return for an office visit in 6 months.  She will be due for a surveillance colonoscopy in 2023.    Betsy Coder, MD  10/10/2020  12:25 PM

## 2020-10-11 ENCOUNTER — Telehealth: Payer: Self-pay | Admitting: Orthopedic Surgery

## 2020-10-11 DIAGNOSIS — Z4789 Encounter for other orthopedic aftercare: Secondary | ICD-10-CM | POA: Diagnosis not present

## 2020-10-11 DIAGNOSIS — Z981 Arthrodesis status: Secondary | ICD-10-CM | POA: Diagnosis not present

## 2020-10-11 DIAGNOSIS — I10 Essential (primary) hypertension: Secondary | ICD-10-CM | POA: Diagnosis not present

## 2020-10-11 DIAGNOSIS — J309 Allergic rhinitis, unspecified: Secondary | ICD-10-CM | POA: Diagnosis not present

## 2020-10-11 NOTE — Telephone Encounter (Signed)
Patient called asked if it it is ok to take the Oxycodone and the Gabapentin on the same day. Patient said she take the Gabapentin for her cough. Patient said she has asthma. The number to contact patient is [phone]

## 2020-10-12 NOTE — Telephone Encounter (Signed)
I caled pt and advised that she should also check with her pharmacist but yes it is ok to take these medications pt says she will not take them together and uses mor sparingly then even rx is written for.

## 2020-10-17 DIAGNOSIS — J309 Allergic rhinitis, unspecified: Secondary | ICD-10-CM | POA: Diagnosis not present

## 2020-10-17 DIAGNOSIS — I10 Essential (primary) hypertension: Secondary | ICD-10-CM | POA: Diagnosis not present

## 2020-10-17 DIAGNOSIS — Z4789 Encounter for other orthopedic aftercare: Secondary | ICD-10-CM | POA: Diagnosis not present

## 2020-10-17 DIAGNOSIS — Z981 Arthrodesis status: Secondary | ICD-10-CM | POA: Diagnosis not present

## 2020-10-23 ENCOUNTER — Other Ambulatory Visit: Payer: Self-pay | Admitting: *Deleted

## 2020-10-23 MED ORDER — IPRATROPIUM BROMIDE 0.06 % NA SOLN: 1.0000 | Freq: Four times a day (QID) | NASAL | 3 refills | Status: DC | PRN

## 2020-10-23 NOTE — Telephone Encounter (Signed)
Rec'd fax for refill on Tramadol 50 mg. Med is not on med list.

## 2020-10-24 ENCOUNTER — Ambulatory Visit: Payer: Self-pay

## 2020-10-24 ENCOUNTER — Encounter: Payer: Self-pay | Admitting: Family

## 2020-10-24 ENCOUNTER — Ambulatory Visit (INDEPENDENT_AMBULATORY_CARE_PROVIDER_SITE_OTHER): Payer: Medicare PPO | Admitting: Family

## 2020-10-24 DIAGNOSIS — M19071 Primary osteoarthritis, right ankle and foot: Secondary | ICD-10-CM | POA: Diagnosis not present
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
## 2020-10-25 DIAGNOSIS — J309 Allergic rhinitis, unspecified: Secondary | ICD-10-CM | POA: Diagnosis not present

## 2020-10-25 DIAGNOSIS — Z981 Arthrodesis status: Secondary | ICD-10-CM | POA: Diagnosis not present

## 2020-10-25 DIAGNOSIS — I10 Essential (primary) hypertension: Secondary | ICD-10-CM | POA: Diagnosis not present

## 2020-10-25 DIAGNOSIS — Z4789 Encounter for other orthopedic aftercare: Secondary | ICD-10-CM | POA: Diagnosis not present

## 2020-10-27 DIAGNOSIS — I10 Essential (primary) hypertension: Secondary | ICD-10-CM | POA: Diagnosis not present

## 2020-10-27 DIAGNOSIS — Z981 Arthrodesis status: Secondary | ICD-10-CM | POA: Diagnosis not present

## 2020-10-27 DIAGNOSIS — Z4789 Encounter for other orthopedic aftercare: Secondary | ICD-10-CM | POA: Diagnosis not present

## 2020-10-27 DIAGNOSIS — J309 Allergic rhinitis, unspecified: Secondary | ICD-10-CM | POA: Diagnosis not present

## 2020-10-29 DIAGNOSIS — I10 Essential (primary) hypertension: Secondary | ICD-10-CM | POA: Diagnosis not present

## 2020-10-29 DIAGNOSIS — J309 Allergic rhinitis, unspecified: Secondary | ICD-10-CM | POA: Diagnosis not present

## 2020-10-29 DIAGNOSIS — Z981 Arthrodesis status: Secondary | ICD-10-CM | POA: Diagnosis not present

## 2020-10-29 DIAGNOSIS — Z4789 Encounter for other orthopedic aftercare: Secondary | ICD-10-CM | POA: Diagnosis not present

## 2020-11-07 ENCOUNTER — Ambulatory Visit (INDEPENDENT_AMBULATORY_CARE_PROVIDER_SITE_OTHER): Payer: Medicare PPO

## 2020-11-07 DIAGNOSIS — J309 Allergic rhinitis, unspecified: Secondary | ICD-10-CM

## 2020-11-08 DIAGNOSIS — Z4789 Encounter for other orthopedic aftercare: Secondary | ICD-10-CM | POA: Diagnosis not present

## 2020-11-08 DIAGNOSIS — I10 Essential (primary) hypertension: Secondary | ICD-10-CM | POA: Diagnosis not present

## 2020-11-08 DIAGNOSIS — Z981 Arthrodesis status: Secondary | ICD-10-CM | POA: Diagnosis not present

## 2020-11-08 DIAGNOSIS — J309 Allergic rhinitis, unspecified: Secondary | ICD-10-CM | POA: Diagnosis not present

## 2020-11-10 DIAGNOSIS — J309 Allergic rhinitis, unspecified: Secondary | ICD-10-CM | POA: Diagnosis not present

## 2020-11-10 DIAGNOSIS — Z981 Arthrodesis status: Secondary | ICD-10-CM | POA: Diagnosis not present

## 2020-11-10 DIAGNOSIS — I10 Essential (primary) hypertension: Secondary | ICD-10-CM | POA: Diagnosis not present

## 2020-11-10 DIAGNOSIS — Z4789 Encounter for other orthopedic aftercare: Secondary | ICD-10-CM | POA: Diagnosis not present

## 2020-11-15 ENCOUNTER — Ambulatory Visit (INDEPENDENT_AMBULATORY_CARE_PROVIDER_SITE_OTHER): Payer: Medicare PPO | Admitting: Family

## 2020-11-15 ENCOUNTER — Encounter: Payer: Self-pay | Admitting: Family

## 2020-11-15 ENCOUNTER — Ambulatory Visit: Payer: Self-pay

## 2020-11-15 DIAGNOSIS — M19071 Primary osteoarthritis, right ankle and foot: Secondary | ICD-10-CM | POA: Diagnosis not present
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
## 2020-11-27 ENCOUNTER — Telehealth: Payer: Self-pay | Admitting: Orthopedic Surgery

## 2020-11-27 ENCOUNTER — Other Ambulatory Visit: Payer: Self-pay

## 2020-11-27 NOTE — Telephone Encounter (Signed)
Pt is requesting refill of her oxycodone. Please advise. Last refill was 09/27/20  I called and sw pt.  She is requesting a Referral for outpatient PT/OT to [facility] orthopedic and athletic rehab ph# [phone] fax# [phone] advised I will fax request with demo sheet and last office visit note.

## 2020-11-27 NOTE — Telephone Encounter (Signed)
Pt called requesting a call back concerning a referral for OT from Dr. Sharol Given. Please call pt at [phone].

## 2020-11-28 ENCOUNTER — Other Ambulatory Visit: Payer: Self-pay | Admitting: Family

## 2020-11-28 MED ORDER — OXYCODONE HCL 5 MG PO TABS: 5.0000 mg | ORAL_TABLET | Freq: Three times a day (TID) | ORAL | 0 refills | Status: DC | PRN

## 2020-12-06 DIAGNOSIS — M25571 Pain in right ankle and joints of right foot: Secondary | ICD-10-CM | POA: Diagnosis not present

## 2020-12-12 DIAGNOSIS — M25571 Pain in right ankle and joints of right foot: Secondary | ICD-10-CM | POA: Diagnosis not present

## 2020-12-14 ENCOUNTER — Ambulatory Visit (INDEPENDENT_AMBULATORY_CARE_PROVIDER_SITE_OTHER): Payer: Medicare PPO | Admitting: Internal Medicine

## 2020-12-14 ENCOUNTER — Other Ambulatory Visit: Payer: Self-pay

## 2020-12-14 ENCOUNTER — Encounter: Payer: Self-pay | Admitting: Internal Medicine

## 2020-12-14 ENCOUNTER — Ambulatory Visit: Payer: Medicare PPO

## 2020-12-14 VITALS — BP 118/70 | HR 72 | Temp 98.4°F | Ht 62.0 in | Wt 203.0 lb

## 2020-12-14 DIAGNOSIS — Z85038 Personal history of other malignant neoplasm of large intestine: Secondary | ICD-10-CM | POA: Diagnosis not present

## 2020-12-14 DIAGNOSIS — M79671 Pain in right foot: Secondary | ICD-10-CM | POA: Diagnosis not present

## 2020-12-14 DIAGNOSIS — Z23 Encounter for immunization: Secondary | ICD-10-CM

## 2020-12-14 DIAGNOSIS — R739 Hyperglycemia, unspecified: Secondary | ICD-10-CM

## 2020-12-14 DIAGNOSIS — K219 Gastro-esophageal reflux disease without esophagitis: Secondary | ICD-10-CM | POA: Diagnosis not present

## 2020-12-14 DIAGNOSIS — I10 Essential (primary) hypertension: Secondary | ICD-10-CM

## 2020-12-14 LAB — HEMOGLOBIN A1C: Hgb A1c MFr Bld: 6.2 % (ref 4.6–6.5)

## 2020-12-14 LAB — COMPREHENSIVE METABOLIC PANEL
ALT: 15 U/L (ref 0–35)
AST: 17 U/L (ref 0–37)
Albumin: 4.1 g/dL (ref 3.5–5.2)
Alkaline Phosphatase: 146 U/L — ABNORMAL HIGH (ref 39–117)
BUN: 15 mg/dL (ref 6–23)
CO2: 30 mEq/L (ref 19–32)
Calcium: 9.9 mg/dL (ref 8.4–10.5)
Chloride: 101 mEq/L (ref 96–112)
Creatinine, Ser: 0.85 mg/dL (ref 0.40–1.20)
GFR: 68.88 mL/min (ref >60.00)
Glucose, Bld: 96 mg/dL (ref 70–99)
Potassium: 3.6 mEq/L (ref 3.5–5.1)
Sodium: 139 mEq/L (ref 135–145)
Total Bilirubin: 0.5 mg/dL (ref 0.2–1.2)
Total Protein: 7.5 g/dL (ref 6.0–8.3)

## 2020-12-14 NOTE — Assessment & Plan Note (Addendum)
F/u w/Dr Benay Spice q 6 months CEA q 6 months

## 2020-12-14 NOTE — Assessment & Plan Note (Signed)
Cont w/Pepcid 

## 2020-12-14 NOTE — Progress Notes (Signed)
Subjective:  Patient ID: [name], female    DOB: [birth year]  Age: 70 y.o. MRN: [id number]  CC: Annual Exam (FLU SHOT)   HPI [name] presents for HTN, OA, h/o colon cancer, foot pain  Outpatient Medications Prior to Visit  Medication Sig Dispense Refill   albuterol (PROVENTIL) (2.5 MG/3ML) 0.083% nebulizer solution Take 3 mLs (2.5 mg total) by nebulization every 4 (four) hours as needed for wheezing or shortness of breath. 225 mL 3   albuterol (VENTOLIN HFA) 108 (90 Base) MCG/ACT inhaler INHALE TWO PUFFS BY MOUTH EVERY 6 HOURS AS NEEDED FOR  WHEEZING/SHORTNESS  OF  BREATH 24 g 1   aspirin EC 81 MG tablet Take 81 mg by mouth in the morning. Swallow whole.     budesonide-formoterol (SYMBICORT) 160-4.5 MCG/ACT inhaler Inhale 2 puffs into the lungs 2 (two) times daily. Rinse, gargle and spit out after use 3 each 1   cetirizine (ZYRTEC) 10 MG tablet Take 1 tablet (10 mg total) by mouth daily. (Patient taking differently: Take 10 mg by mouth at bedtime.) 90 tablet 3   Cholecalciferol (VITAMIN D) 1000 UNITS capsule Take 1,000 Units by mouth every morning.     Cyanocobalamin (VITAMIN B-12) 1000 MCG SUBL Place 1 tablet (1,000 mcg total) under the tongue daily. 100 tablet 3   EPINEPHrine 0.3 mg/0.3 mL IJ SOAJ injection Use as directed for severe allergic reaction (Patient taking differently: Inject 0.3 mg into the muscle as needed for anaphylaxis. Use as directed for severe allergic reaction) 2 each 1   famotidine (PEPCID) 40 MG tablet Take 1 tablet (40 mg total) by mouth daily. (Patient taking differently: Take 40 mg by mouth at bedtime.) 90 tablet 3   ferrous sulfate 325 (65 FE) MG tablet Take 325 mg by mouth in the morning and at bedtime.     fluticasone (FLONASE) 50 MCG/ACT nasal spray 1-2 sprays each nostril once daily (Patient taking differently: Place 2 sprays into both nostrils daily as needed for allergies.) 48 g 3   gabapentin (NEURONTIN) 300 MG capsule TAKE 1 CAPSULE  (300 MG TOTAL) BY MOUTH 2 (TWO) TIMES DAILY. (Patient taking differently: Take 300 mg by mouth 2 (two) times daily as needed.) 180 capsule 3   hydrochlorothiazide (HYDRODIURIL) 25 MG tablet TAKE 1 TABLET EVERY DAY 90 tablet 3   ipratropium (ATROVENT) 0.06 % nasal spray Place 1 spray into both nostrils 4 (four) times daily as needed (allergies.). 45 mL 3   irbesartan (AVAPRO) 150 MG tablet TAKE 1 TABLET EVERY DAY 90 tablet 3   montelukast (SINGULAIR) 10 MG tablet TAKE 1 TABLET ONE TIME DAILY WITH BREAKFAST (Patient taking differently: Take 10 mg by mouth at bedtime. TAKE 1 TABLET ONE TIME DAILY WITH BREAKFAST) 90 tablet 3   omeprazole (PRILOSEC) 20 MG capsule Take 1 capsule (20 mg total) by mouth daily. 90 capsule 3   oxyCODONE (OXY IR/ROXICODONE) 5 MG immediate release tablet Take 1 tablet (5 mg total) by mouth every 8 (eight) hours as needed for moderate pain (pain score 4-6). 21 tablet 0   potassium chloride (KLOR-CON) 10 MEQ tablet TAKE 1 TABLET EVERY DAY 90 tablet 3   No facility-administered medications prior to visit.    ROS: Review of Systems  Constitutional:  Negative for activity change, appetite change, chills, fatigue and unexpected weight change.  HENT:  Negative for congestion, mouth sores and sinus pressure.   Eyes:  Negative for visual disturbance.  Respiratory:  Negative for cough and chest  tightness.   Gastrointestinal:  Negative for abdominal pain and nausea.  Genitourinary:  Negative for difficulty urinating, frequency and vaginal pain.  Musculoskeletal:  Positive for arthralgias and gait problem. Negative for back pain.  Skin:  Negative for pallor and rash.  Neurological:  Negative for dizziness, tremors, weakness, numbness and headaches.  Psychiatric/Behavioral:  Negative for confusion and sleep disturbance.    Objective:  BP 118/70 (BP Location: Left Arm)   Pulse 72   Temp 98.4 F (36.9 C) (Oral)   Ht 5\' 2"  (1.575 m)   Wt 203 lb (92.1 kg)   SpO2 98%   BMI 37.13  kg/m   BP Readings from Last 3 Encounters:  12/14/20 118/70  10/10/20 117/71  09/14/20 119/67    Wt Readings from Last 3 Encounters:  12/14/20 203 lb (92.1 kg)  10/10/20 202 lb 9.6 oz (91.9 kg)  09/13/20 206 lb (93.4 kg)    Physical Exam Constitutional:      General: She is not in acute distress.    Appearance: She is well-developed. She is obese.  HENT:     Head: Normocephalic.     Right Ear: External ear normal.     Left Ear: External ear normal.     Nose: Nose normal.  Eyes:     General:        Right eye: No discharge.        Left eye: No discharge.     Conjunctiva/sclera: Conjunctivae normal.     Pupils: Pupils are equal, round, and reactive to light.  Neck:     Thyroid: No thyromegaly.     Vascular: No JVD.     Trachea: No tracheal deviation.  Cardiovascular:     Rate and Rhythm: Normal rate and regular rhythm.     Heart sounds: Normal heart sounds.  Pulmonary:     Effort: No respiratory distress.     Breath sounds: No stridor. No wheezing.  Abdominal:     General: Bowel sounds are normal. There is no distension.     Palpations: Abdomen is soft. There is no mass.     Tenderness: no abdominal tenderness There is no guarding or rebound.  Musculoskeletal:        General: Tenderness present.     Cervical back: Normal range of motion and neck supple. No rigidity.  Lymphadenopathy:     Cervical: No cervical adenopathy.  Skin:    Findings: No erythema or rash.  Neurological:     Cranial Nerves: No cranial nerve deficit.     Motor: No abnormal muscle tone.     Coordination: Coordination normal.     Gait: Gait normal.     Deep Tendon Reflexes: Reflexes normal.  Psychiatric:        Behavior: Behavior normal.        Thought Content: Thought content normal.        Judgment: Judgment normal.   Antalgic gait, using a cane R foot w/pain   Lab Results  Component Value Date   WBC 5.4 09/11/2020   HGB 11.6 (L) 09/11/2020   HCT 36.7 09/11/2020   PLT 249  09/11/2020   GLUCOSE 97 09/11/2020   CHOL 170 11/10/2018   TRIG 65.0 11/10/2018   HDL 79.00 11/10/2018   LDLCALC 78 11/10/2018   ALT 15 09/11/2020   AST 16 09/11/2020   NA 140 09/11/2020   K 3.7 09/11/2020   CL 103 09/11/2020   CREATININE 0.79 09/11/2020   BUN 14 09/11/2020  CO2 27 09/11/2020   TSH 2.59 02/22/2020   INR 1.07 02/02/2013   HGBA1C 5.5 09/20/2013    DG MINI C-ARM IMAGE ONLY  Result Date: 09/13/2020 There is no interpretation for this exam.  This order is for images obtained during a surgical procedure.  Please See "Surgeries" Tab for more information regarding the procedure.    Assessment & Plan:   Problem List Items Addressed This Visit     GERD (gastroesophageal reflux disease) (Chronic)    Cont w/Pepcid      Essential hypertension   Foot pain, right    S/p surgery - recovering. S/p RIGHT TALONAVICULAR AND SUBTALAR FUSION on 09/13/2020 by Dr Sharol Given      History of colon cancer    F/u w/Dr Benay Spice q 6 months CEA q 6 months      Other Visit Diagnoses     Needs flu shot    -  Primary   Relevant Orders   Flu Vaccine QUAD High Dose(Fluad) (Completed)   Hyperglycemia       Relevant Orders   Comprehensive metabolic panel   Hemoglobin A1c         No orders of the defined types were placed in this encounter.     Follow-up: No follow-ups on file.  Walker Kehr, MD

## 2020-12-14 NOTE — Assessment & Plan Note (Signed)
S/p surgery - recovering. S/p RIGHT TALONAVICULAR AND SUBTALAR FUSION on 09/13/2020 by Dr Sharol Given

## 2020-12-15 DIAGNOSIS — M25571 Pain in right ankle and joints of right foot: Secondary | ICD-10-CM | POA: Diagnosis not present

## 2020-12-18 ENCOUNTER — Ambulatory Visit (INDEPENDENT_AMBULATORY_CARE_PROVIDER_SITE_OTHER): Payer: Medicare PPO | Admitting: *Deleted

## 2020-12-18 DIAGNOSIS — J309 Allergic rhinitis, unspecified: Secondary | ICD-10-CM

## 2020-12-20 DIAGNOSIS — M25571 Pain in right ankle and joints of right foot: Secondary | ICD-10-CM | POA: Diagnosis not present

## 2020-12-25 DIAGNOSIS — M25571 Pain in right ankle and joints of right foot: Secondary | ICD-10-CM | POA: Diagnosis not present

## 2020-12-26 ENCOUNTER — Encounter: Payer: Self-pay | Admitting: Orthopedic Surgery

## 2020-12-26 ENCOUNTER — Ambulatory Visit (INDEPENDENT_AMBULATORY_CARE_PROVIDER_SITE_OTHER): Payer: Medicare PPO | Admitting: Orthopedic Surgery

## 2020-12-26 ENCOUNTER — Other Ambulatory Visit: Payer: Self-pay

## 2020-12-26 DIAGNOSIS — M19071 Primary osteoarthritis, right ankle and foot: Secondary | ICD-10-CM
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
## 2020-12-27 DIAGNOSIS — M25571 Pain in right ankle and joints of right foot: Secondary | ICD-10-CM | POA: Diagnosis not present

## 2021-01-03 ENCOUNTER — Other Ambulatory Visit: Payer: Self-pay | Admitting: Internal Medicine

## 2021-01-08 DIAGNOSIS — M25571 Pain in right ankle and joints of right foot: Secondary | ICD-10-CM | POA: Diagnosis not present

## 2021-01-12 DIAGNOSIS — M25571 Pain in right ankle and joints of right foot: Secondary | ICD-10-CM | POA: Diagnosis not present

## 2021-01-15 DIAGNOSIS — M25571 Pain in right ankle and joints of right foot: Secondary | ICD-10-CM | POA: Diagnosis not present

## 2021-01-18 ENCOUNTER — Other Ambulatory Visit: Payer: Self-pay | Admitting: Internal Medicine

## 2021-01-23 ENCOUNTER — Ambulatory Visit (INDEPENDENT_AMBULATORY_CARE_PROVIDER_SITE_OTHER): Payer: Medicare PPO | Admitting: *Deleted

## 2021-01-23 DIAGNOSIS — J309 Allergic rhinitis, unspecified: Secondary | ICD-10-CM | POA: Diagnosis not present

## 2021-01-24 DIAGNOSIS — M25571 Pain in right ankle and joints of right foot: Secondary | ICD-10-CM | POA: Diagnosis not present

## 2021-01-26 DIAGNOSIS — M25571 Pain in right ankle and joints of right foot: Secondary | ICD-10-CM | POA: Diagnosis not present

## 2021-01-30 ENCOUNTER — Ambulatory Visit (INDEPENDENT_AMBULATORY_CARE_PROVIDER_SITE_OTHER): Payer: Medicare Other | Admitting: *Deleted

## 2021-01-30 DIAGNOSIS — J309 Allergic rhinitis, unspecified: Secondary | ICD-10-CM

## 2021-02-05 ENCOUNTER — Ambulatory Visit (INDEPENDENT_AMBULATORY_CARE_PROVIDER_SITE_OTHER): Payer: Medicare Other | Admitting: *Deleted

## 2021-02-05 DIAGNOSIS — J309 Allergic rhinitis, unspecified: Secondary | ICD-10-CM

## 2021-02-07 ENCOUNTER — Ambulatory Visit (INDEPENDENT_AMBULATORY_CARE_PROVIDER_SITE_OTHER): Payer: Medicare Other | Admitting: Allergy & Immunology

## 2021-02-07 ENCOUNTER — Other Ambulatory Visit: Payer: Self-pay

## 2021-02-07 ENCOUNTER — Encounter: Payer: Self-pay | Admitting: Allergy & Immunology

## 2021-02-07 VITALS — BP 122/78 | HR 66 | Temp 96.8°F | Resp 16 | Ht 62.0 in | Wt 205.8 lb

## 2021-02-07 DIAGNOSIS — J3089 Other allergic rhinitis: Secondary | ICD-10-CM

## 2021-02-07 DIAGNOSIS — K219 Gastro-esophageal reflux disease without esophagitis: Secondary | ICD-10-CM | POA: Diagnosis not present

## 2021-02-07 DIAGNOSIS — J4540 Moderate persistent asthma, uncomplicated: Secondary | ICD-10-CM | POA: Diagnosis not present

## 2021-02-07 DIAGNOSIS — J302 Other seasonal allergic rhinitis: Secondary | ICD-10-CM

## 2021-02-07 DIAGNOSIS — R053 Chronic cough: Secondary | ICD-10-CM | POA: Diagnosis not present

## 2021-02-07 NOTE — Patient Instructions (Addendum)
1. Seasonal and perennial allergic rhinitis (trees, weeds, grasses, molds, dust mites, cat, dog and cockroach) - Continue allergy shots at the same schedule.  - Continue with the daily use of the nose sprays. - Continue with Xyzal (levocetirizine) 5mg  tablet once daily - Continue saline nasal rinses - Continue with Tessalon perles as needed (let us know if you need refills).   2. Moderate persistent asthma, uncomplicated - Lung testing looked beautiful today again.  - You are doing an awesome job.  - We will continue you on Symbicort 160-2 puffs twice a day with a spacer.  - Daily controller medication(s): Symbicort 160/4.68mcg two puffs twice daily with spacer + montelukast 10 mg once a day - Prior to physical activity: albuterol 2 puffs 10-15 minutes before physical activity. - Rescue medications: albuterol 4 puffs every 4-6 hours as needed - Asthma control goals:  * Full participation in all desired activities (may need albuterol before activity) * Albuterol use two time or less a week on average (not counting use with activity) * Cough interfering with sleep two time or less a month * Oral steroids no more than once a year * No hospitalization  3. GERD - Continue avoidance of caffeine, chocolate, and peppermint intake - Continue with omeprazole in the morning. - Continue famotidine 40 mg at night.  3. Return in about 6 months (around 08/07/2021).    Please inform us of any Emergency Department visits, hospitalizations, or changes in symptoms. Call us before going to the ED for breathing or allergy symptoms since we might be able to fit you in for a sick visit. Feel free to contact us anytime with any questions, problems, or concerns.  It was a pleasure to see you again today!  Websites that have reliable patient information: 1. American Academy of Asthma, Allergy, and Immunology: www.aaaai.org 2. Food Allergy Research and Education (FARE): foodallergy.org 3. Mothers of  Asthmatics: http://www.asthmacommunitynetwork.org 4. American College of Allergy, Asthma, and Immunology: www.acaai.org   COVID-19 Vaccine Information can be found at: ShippingScam.co.uk For questions related to vaccine distribution or appointments, please email [email or web address] or call [phone].   We realize that you might be concerned about having an allergic reaction to the COVID19 vaccines. To help with that concern, WE ARE OFFERING THE COVID19 VACCINES IN OUR OFFICE! Ask the front desk for dates!     Like [place] on [place] and Instagram for our latest updates!      A healthy democracy works best when [employer] participate! Make sure you are registered to vote! If you have moved or changed any of your contact information, you will need to get this updated before voting!  In some cases, you MAY be able to register to vote online: [email or web address]
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
## 2021-02-12 ENCOUNTER — Ambulatory Visit (INDEPENDENT_AMBULATORY_CARE_PROVIDER_SITE_OTHER): Payer: Medicare Other

## 2021-02-12 DIAGNOSIS — J309 Allergic rhinitis, unspecified: Secondary | ICD-10-CM

## 2021-02-19 ENCOUNTER — Ambulatory Visit (INDEPENDENT_AMBULATORY_CARE_PROVIDER_SITE_OTHER): Payer: Medicare Other

## 2021-02-19 DIAGNOSIS — J309 Allergic rhinitis, unspecified: Secondary | ICD-10-CM

## 2021-02-22 ENCOUNTER — Other Ambulatory Visit: Payer: Self-pay

## 2021-02-22 ENCOUNTER — Ambulatory Visit (INDEPENDENT_AMBULATORY_CARE_PROVIDER_SITE_OTHER): Payer: Medicare Other

## 2021-02-22 DIAGNOSIS — Z0000 Encounter for general adult medical examination without abnormal findings: Secondary | ICD-10-CM | POA: Diagnosis not present

## 2021-02-22 DIAGNOSIS — Z599 Problem related to housing and economic circumstances, unspecified: Secondary | ICD-10-CM | POA: Diagnosis not present
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
## 2021-02-23 ENCOUNTER — Telehealth: Payer: Self-pay

## 2021-02-23 NOTE — Telephone Encounter (Signed)
° °  Telephone encounter was:  Successful.  02/23/2021 Name: [name] MRN: [id number] DOB: 1950-10-31  [name] is a 71 y.o. year old female who is a primary care patient of Plotnikov, Evie Lacks, MD . The community resource team was consulted for assistance with  [facility] guide performed the following interventions: Patient provided with information about care guide support team and interviewed to confirm resource needs.Patient stated she needs outside hand rails for her steps going into her home   Follow Up Plan:  Care guide will follow up with patient by phone over the next few days    [place], Care Management  [phone] [address], [place], [place] [address] Phone: [phone] Email: [email or web address]

## 2021-02-27 ENCOUNTER — Telehealth: Payer: Self-pay

## 2021-02-27 NOTE — Telephone Encounter (Signed)
° °  Telephone encounter was:  Successful.  02/27/2021 Name: [name] MRN: [id number] DOB: 06/12/1950  [name] is a 71 y.o. year old female who is a primary care patient of Plotnikov, Evie Lacks, MD . The community resource team was consulted for assistance with  hand rails for home  Care guide performed the following interventions: Patient provided with information about care guide support team and interviewed to confirm resource needs.Follow up call with numbers for resources about ramps and hand rails. Will also mail resources  Follow Up Plan:  No further follow up planned at this time. The patient has been provided with needed resources.    [facility], Care Management  [phone] [address], [place], [place] [address] Phone: [phone] Email: [email or web address]

## 2021-02-27 NOTE — Telephone Encounter (Signed)
° °  Telephone encounter was:  Successful.  02/27/2021 Name: LAKEYA MULKA MRN: [id number] DOB: 17-Mar-1950  Rickey Primus [place] is a 71 y.o. year old female who is a primary care patient of Plotnikov, Evie Lacks, MD . The community resource team was consulted for assistance with  [facility] guide performed the following interventions: Patient provided with information about care guide support team and interviewed to confirm resource needs. Mailing letter  Follow Up Plan:  No further follow up planned at this time. The patient has been provided with needed resources.    [facility], Care Management  [phone] [address], [place], [place] [address] Phone: [phone] Email: [email or web address]

## 2021-03-15 ENCOUNTER — Ambulatory Visit: Payer: Medicare PPO | Admitting: Internal Medicine

## 2021-03-20 ENCOUNTER — Encounter: Payer: Self-pay | Admitting: Internal Medicine

## 2021-03-20 ENCOUNTER — Other Ambulatory Visit: Payer: Self-pay

## 2021-03-20 ENCOUNTER — Ambulatory Visit (INDEPENDENT_AMBULATORY_CARE_PROVIDER_SITE_OTHER): Payer: Medicare Other

## 2021-03-20 ENCOUNTER — Ambulatory Visit (INDEPENDENT_AMBULATORY_CARE_PROVIDER_SITE_OTHER): Payer: Medicare Other | Admitting: Internal Medicine

## 2021-03-20 VITALS — BP 130/68 | HR 79 | Resp 18 | Ht 62.0 in | Wt 203.8 lb

## 2021-03-20 DIAGNOSIS — Z6841 Body Mass Index (BMI) 40.0 and over, adult: Secondary | ICD-10-CM | POA: Diagnosis not present

## 2021-03-20 DIAGNOSIS — D510 Vitamin B12 deficiency anemia due to intrinsic factor deficiency: Secondary | ICD-10-CM | POA: Diagnosis not present

## 2021-03-20 DIAGNOSIS — J309 Allergic rhinitis, unspecified: Secondary | ICD-10-CM | POA: Diagnosis not present

## 2021-03-20 DIAGNOSIS — Z85038 Personal history of other malignant neoplasm of large intestine: Secondary | ICD-10-CM

## 2021-03-20 DIAGNOSIS — K219 Gastro-esophageal reflux disease without esophagitis: Secondary | ICD-10-CM | POA: Diagnosis not present

## 2021-03-20 DIAGNOSIS — R739 Hyperglycemia, unspecified: Secondary | ICD-10-CM | POA: Diagnosis not present

## 2021-03-20 LAB — COMPREHENSIVE METABOLIC PANEL
ALT: 13 U/L (ref 0–35)
AST: 15 U/L (ref 0–37)
Albumin: 4.1 g/dL (ref 3.5–5.2)
Alkaline Phosphatase: 136 U/L — ABNORMAL HIGH (ref 39–117)
BUN: 11 mg/dL (ref 6–23)
CO2: 30 mEq/L (ref 19–32)
Calcium: 9.6 mg/dL (ref 8.4–10.5)
Chloride: 104 mEq/L (ref 96–112)
Creatinine, Ser: 0.86 mg/dL (ref 0.40–1.20)
GFR: 67.8 mL/min (ref >60.00)
Glucose, Bld: 97 mg/dL (ref 70–99)
Potassium: 3.6 mEq/L (ref 3.5–5.1)
Sodium: 140 mEq/L (ref 135–145)
Total Bilirubin: 0.5 mg/dL (ref 0.2–1.2)
Total Protein: 7.4 g/dL (ref 6.0–8.3)

## 2021-03-20 LAB — VITAMIN B12: Vitamin B-12: 692 pg/mL (ref 211–911)

## 2021-03-20 LAB — CBC WITH DIFFERENTIAL/PLATELET
Basophils Absolute: 0 10*3/uL (ref 0.0–0.1)
Basophils Relative: 0.5 % (ref 0.0–3.0)
Eosinophils Absolute: 0.1 10*3/uL (ref 0.0–0.7)
Eosinophils Relative: 1.3 % (ref 0.0–5.0)
HCT: 36.9 % (ref 36.0–46.0)
Hemoglobin: 11.8 g/dL — ABNORMAL LOW (ref 12.0–15.0)
Lymphocytes Relative: 25.3 % (ref 12.0–46.0)
Lymphs Abs: 1.1 10*3/uL (ref 0.7–4.0)
MCHC: 32 g/dL (ref 30.0–36.0)
MCV: 73.7 fl — ABNORMAL LOW (ref 78.0–100.0)
Monocytes Absolute: 0.5 10*3/uL (ref 0.1–1.0)
Monocytes Relative: 11 % (ref 3.0–12.0)
Neutro Abs: 2.8 10*3/uL (ref 1.4–7.7)
Neutrophils Relative %: 61.9 % (ref 43.0–77.0)
Platelets: 246 10*3/uL (ref 150.0–400.0)
RBC: 5.01 Mil/uL (ref 3.87–5.11)
RDW: 16.5 % — ABNORMAL HIGH (ref 11.5–15.5)
WBC: 4.5 10*3/uL (ref 4.0–10.5)

## 2021-03-20 LAB — HEMOGLOBIN A1C: Hgb A1c MFr Bld: 6.1 % (ref 4.6–6.5)

## 2021-03-20 LAB — TSH: TSH: 1.57 u[IU]/mL (ref 0.35–5.50)

## 2021-03-20 NOTE — Progress Notes (Signed)
Subjective:  Patient ID: [name], female    DOB: 03/27/1950  Age: 71 y.o. MRN: [id number]  CC: 3 month f/u (No concerns. )   HPI [name] presents for GERD, asthma, HTN, pernicious anemia, cancer f/u  Outpatient Medications Prior to Visit  Medication Sig Dispense Refill   albuterol (PROVENTIL) (2.5 MG/3ML) 0.083% nebulizer solution Take 3 mLs (2.5 mg total) by nebulization every 4 (four) hours as needed for wheezing or shortness of breath. 225 mL 3   albuterol (VENTOLIN HFA) 108 (90 Base) MCG/ACT inhaler INHALE 2 PUFFS BY MOUTH EVERY 6 HOURS AS NEEDED FOR  WHEEZING/SHORTNESS  OF  BREATH 18 g 9   aspirin EC 81 MG tablet Take 81 mg by mouth in the morning. Swallow whole.     cetirizine (ZYRTEC) 10 MG tablet Take 1 tablet (10 mg total) by mouth daily. (Patient taking differently: Take 10 mg by mouth at bedtime.) 90 tablet 3   Cholecalciferol (VITAMIN D) 1000 UNITS capsule Take 1,000 Units by mouth every morning.     Cyanocobalamin (VITAMIN B-12) 1000 MCG SUBL Place 1 tablet (1,000 mcg total) under the tongue daily. 100 tablet 3   EPINEPHrine 0.3 mg/0.3 mL IJ SOAJ injection Use as directed for severe allergic reaction (Patient taking differently: Inject 0.3 mg into the muscle as needed for anaphylaxis. Use as directed for severe allergic reaction) 2 each 1   famotidine (PEPCID) 40 MG tablet Take 1 tablet (40 mg total) by mouth daily. (Patient taking differently: Take 40 mg by mouth at bedtime.) 90 tablet 3   ferrous sulfate 325 (65 FE) MG tablet Take 325 mg by mouth in the morning and at bedtime.     fluticasone (FLONASE) 50 MCG/ACT nasal spray 1-2 sprays each nostril once daily (Patient taking differently: Place 2 sprays into both nostrils daily as needed for allergies.) 48 g 3   gabapentin (NEURONTIN) 300 MG capsule TAKE 1 CAPSULE (300 MG TOTAL) BY MOUTH 2 (TWO) TIMES DAILY. (Patient taking differently: Take 300 mg by mouth 2 (two) times daily as needed.) 180 capsule 3    hydrochlorothiazide (HYDRODIURIL) 25 MG tablet TAKE 1 TABLET EVERY DAY 90 tablet 3   ipratropium (ATROVENT) 0.06 % nasal spray Place 1 spray into both nostrils 4 (four) times daily as needed (allergies.). 45 mL 3   irbesartan (AVAPRO) 150 MG tablet TAKE 1 TABLET EVERY DAY 90 tablet 3   montelukast (SINGULAIR) 10 MG tablet TAKE 1 TABLET ONE TIME DAILY WITH BREAKFAST (Patient taking differently: Take 10 mg by mouth at bedtime. TAKE 1 TABLET ONE TIME DAILY WITH BREAKFAST) 90 tablet 3   omeprazole (PRILOSEC) 20 MG capsule Take 1 capsule (20 mg total) by mouth daily. 90 capsule 3   oxyCODONE (OXY IR/ROXICODONE) 5 MG immediate release tablet Take 1 tablet (5 mg total) by mouth every 8 (eight) hours as needed for moderate pain (pain score 4-6). 21 tablet 0   potassium chloride (KLOR-CON) 10 MEQ tablet TAKE 1 TABLET EVERY DAY 90 tablet 3   SYMBICORT 160-4.5 MCG/ACT inhaler INHALE 2 PUFFS INTO THE LUNGS 2 (TWO) TIMES DAILY. RINSE, GARGLE AND SPIT OUT AFTER USE 3 each 1   No facility-administered medications prior to visit.    ROS: Review of Systems  Constitutional:  Negative for activity change, appetite change, chills, fatigue and unexpected weight change.  HENT:  Negative for congestion, mouth sores and sinus pressure.   Eyes:  Negative for visual disturbance.  Respiratory:  Negative for cough and  chest tightness.   Gastrointestinal:  Negative for abdominal pain and nausea.  Genitourinary:  Negative for difficulty urinating, frequency and vaginal pain.  Musculoskeletal:  Positive for arthralgias and gait problem. Negative for back pain.  Skin:  Negative for pallor and rash.  Neurological:  Negative for dizziness, tremors, weakness, numbness and headaches.  Psychiatric/Behavioral:  Negative for confusion and sleep disturbance.    Objective:  BP 130/68    Pulse 79    Resp 18    Ht 5\' 2"  (1.575 m)    Wt 203 lb 12.8 oz (92.4 kg)    SpO2 97%    BMI 37.28 kg/m   BP Readings from Last 3 Encounters:   03/20/21 130/68  02/07/21 122/78  12/14/20 118/70    Wt Readings from Last 3 Encounters:  03/20/21 203 lb 12.8 oz (92.4 kg)  02/07/21 205 lb 12.8 oz (93.4 kg)  12/14/20 203 lb (92.1 kg)    Physical Exam Constitutional:      General: She is not in acute distress.    Appearance: She is well-developed. She is obese.  HENT:     Head: Normocephalic.     Right Ear: External ear normal.     Left Ear: External ear normal.     Nose: Nose normal.  Eyes:     General:        Right eye: No discharge.        Left eye: No discharge.     Conjunctiva/sclera: Conjunctivae normal.     Pupils: Pupils are equal, round, and reactive to light.  Neck:     Thyroid: No thyromegaly.     Vascular: No JVD.     Trachea: No tracheal deviation.  Cardiovascular:     Rate and Rhythm: Normal rate and regular rhythm.     Heart sounds: Normal heart sounds.  Pulmonary:     Effort: No respiratory distress.     Breath sounds: No stridor. No wheezing.  Abdominal:     General: Bowel sounds are normal. There is no distension.     Palpations: Abdomen is soft. There is no mass.     Tenderness: There is no abdominal tenderness. There is no guarding or rebound.  Musculoskeletal:        General: No tenderness.     Cervical back: Normal range of motion and neck supple. No rigidity.  Lymphadenopathy:     Cervical: No cervical adenopathy.  Skin:    Findings: No erythema or rash.  Neurological:     Mental Status: She is oriented to person, place, and time.     Cranial Nerves: No cranial nerve deficit.     Motor: No abnormal muscle tone.     Coordination: Coordination abnormal.     Gait: Gait abnormal.     Deep Tendon Reflexes: Reflexes normal.  Psychiatric:        Behavior: Behavior normal.        Thought Content: Thought content normal.        Judgment: Judgment normal.  Using a cane  Lab Results  Component Value Date   WBC 5.4 09/11/2020   HGB 11.6 (L) 09/11/2020   HCT 36.7 09/11/2020   PLT 249  09/11/2020   GLUCOSE 96 12/14/2020   CHOL 170 11/10/2018   TRIG 65.0 11/10/2018   HDL 79.00 11/10/2018   LDLCALC 78 11/10/2018   ALT 15 12/14/2020   AST 17 12/14/2020   NA 139 12/14/2020   K 3.6 12/14/2020   CL 101  12/14/2020   CREATININE 0.85 12/14/2020   BUN 15 12/14/2020   CO2 30 12/14/2020   TSH 2.59 02/22/2020   INR 1.07 02/02/2013   HGBA1C 6.2 12/14/2020    DG MINI C-ARM IMAGE ONLY  Result Date: 09/13/2020 There is no interpretation for this exam.  This order is for images obtained during a surgical procedure.  Please See "Surgeries" Tab for more information regarding the procedure.    Assessment & Plan:   Problem List Items Addressed This Visit     GERD (gastroesophageal reflux disease) (Chronic)    Cont on Omeprazole, Pepcid      History of colon cancer    CEA q 6 months F/u w/Dr Benay Spice q 6 months      Relevant Orders   CEA   Obesity    Wt Readings from Last 3 Encounters:  03/20/21 203 lb 12.8 oz (92.4 kg)  02/07/21 205 lb 12.8 oz (93.4 kg)  12/14/20 203 lb (92.1 kg)        Relevant Orders   Comprehensive metabolic panel   Hemoglobin A1c   TSH   Pernicious anemia    On B12 po      Relevant Orders   TSH   Vitamin B12   CBC with Differential/Platelet   Other Visit Diagnoses     Hyperglycemia    -  Primary   Relevant Orders   Comprehensive metabolic panel   Hemoglobin A1c         No orders of the defined types were placed in this encounter.     Follow-up: Return in about 4 months (around 07/18/2021) for a follow-up visit.  Walker Kehr, MD

## 2021-03-20 NOTE — Assessment & Plan Note (Signed)
Wt Readings from Last 3 Encounters:  03/20/21 203 lb 12.8 oz (92.4 kg)  02/07/21 205 lb 12.8 oz (93.4 kg)  12/14/20 203 lb (92.1 kg)

## 2021-03-20 NOTE — Assessment & Plan Note (Signed)
Cont on Omeprazole, Pepcid 

## 2021-03-20 NOTE — Assessment & Plan Note (Signed)
On B12 po 

## 2021-03-20 NOTE — Assessment & Plan Note (Signed)
CEA q 6 months F/u w/Dr Benay Spice q 6 months

## 2021-03-21 LAB — CEA: CEA: <2 ng/mL

## 2021-04-09 ENCOUNTER — Telehealth: Payer: Self-pay | Admitting: *Deleted

## 2021-04-09 ENCOUNTER — Inpatient Hospital Stay: Payer: Medicare Other | Admitting: Nurse Practitioner

## 2021-04-09 ENCOUNTER — Inpatient Hospital Stay: Payer: Medicare Other | Attending: Oncology

## 2021-04-09 ENCOUNTER — Other Ambulatory Visit: Payer: Self-pay

## 2021-04-09 ENCOUNTER — Encounter: Payer: Self-pay | Admitting: Nurse Practitioner

## 2021-04-09 VITALS — BP 134/73 | HR 86 | Temp 97.7°F | Resp 20 | Ht 62.0 in | Wt 204.0 lb

## 2021-04-09 DIAGNOSIS — C184 Malignant neoplasm of transverse colon: Secondary | ICD-10-CM | POA: Diagnosis not present

## 2021-04-09 DIAGNOSIS — C185 Malignant neoplasm of splenic flexure: Secondary | ICD-10-CM | POA: Insufficient documentation

## 2021-04-09 LAB — CEA (ACCESS): CEA (CHCC): 1.74 ng/mL (ref 0.00–5.00)

## 2021-04-09 NOTE — Telephone Encounter (Signed)
Patient notified of CEA of 1.74 and that it was in normal range. She asked me to repeat it a couple of times. Patient repeated back her CEA level to me.  ?

## 2021-04-09 NOTE — Progress Notes (Signed)
?  [facility] ?OFFICE PROGRESS NOTE ? ? ?Diagnosis: Colon cancer ? ?INTERVAL HISTORY:  ? ?[name] returns as scheduled.  She overall is feeling well.  She has a good appetite.  No change in bowel habits.  No bleeding with bowel movements.  No nausea or vomiting. ? ?Objective: ? ?Vital signs in last 24 hours: ? ?Blood pressure 134/73, pulse 86, temperature 97.7 ?F (36.5 ?C), temperature source Oral, resp. rate 20, height 5\' 2"  (1.575 m), weight 204 lb (92.5 kg), SpO2 100 %. ?  ? ?Lymphatics: No palpable cervical, supraclavicular, axillary or inguinal lymph nodes. ?Resp: Lungs clear bilaterally. ?Cardio: Regular rate and rhythm. ?GI: Abdomen soft and nontender.  No hepatomegaly.  No mass. ?Vascular: No leg edema. ? ? ? ?Lab Results: ? ?Lab Results  ?Component Value Date  ? WBC 4.5 03/20/2021  ? HGB 11.8 (L) 03/20/2021  ? HCT 36.9 03/20/2021  ? MCV 73.7 (L) 03/20/2021  ? PLT 246.0 03/20/2021  ? NEUTROABS 2.8 03/20/2021  ? ? ?Imaging: ? ?No results found. ? ?Medications: I have reviewed the patient's current medications. ? 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   ?Presentation to the emergency room 02/02/2013 with a colonic obstruction secondary to tumor at the splenic flexure.   ?Baseline CEA on 02/02/2013 less than 0.5.   ?Initiation of adjuvant Xeloda 04/10/2013.   ?Cycle 2 adjuvant Xeloda 05/01/2013.   ?Cycle 3 adjuvant Xeloda 05/22/2013.   ?Cycle 4 adjuvant Xeloda 06/12/2013.   ?Cycle 5 adjuvant Xeloda 07/03/2013 (Xeloda dose reduced due to hand foot syndrome).   ?Cycle 6 adjuvant Xeloda 07/24/2013.   ?Cycle 7 adjuvant Xeloda 08/14/2013   ?Cycle 8 adjuvant Xeloda 09/07/2013.  ?Surveillance colonoscopy  11/26/2016-patent end to side colocolonic anastomosis, characterized by healthy-appearing mucosa.  Examination otherwise normal.  Repeat colonoscopy in 5 years for surveillance. ? ?History of iron deficiency anemia. Recurrent anemia 02/01/2014, improved, Persistent red cell microcytosis ?Asthma. ?Hand-foot syndrome secondary to Xeloda. ?Status post ostomy reversal 11/25/2013. ?CT 02/01/2014 with no evidence of local tumor recurrence or metastatic disease. New area of masslike thickening and small bowel dilatation at the mid small bowel ?Status post deep enteroscopy at [facility] on 03/16/2014, negative. ?Status post capsule endoscopy 03/22/2014, confirmed a small bowel tumor ?Exploratory laparotomy with resection of a small bowel mass on 04/06/2014 with the pathology confirming invasive adenocarcinoma extending through small bowel wall and involving adjacent loops of adherent small bowel, 2 of 12 lymph nodes positive. History consistent with recurrent colon cancer. Loss of MLH1 and PMS 2, MSI high as was the January 2015 tumor ?CT abdomen/pelvis 02/03/2015-no evidence of recurrent colon cancer, ventral hernias ?CT chest/abdomen/pelvis 03/09/2018-no evidence of recurrent or metastatic disease.  Nonspecific 2 mm posterior right upper lobe nodule.  Continued attention on follow-up exams suggested. ?CT chest 09/13/2019-no evidence of metastatic disease ?CTs 09/11/2020-no evidence of thoracic metastases, no evidence of metastatic disease in the abdomen or pelvis ?  ?  ?  ? ?Disposition: [name] remains in clinical remission from colon cancer.  Plan for restaging CTs in approximately 6 months.  Surveillance colonoscopy due October 2023. ? ?She will return for follow-up a few days after the CT scans. ? ? ? ?November 2023 ANP/GNP-BC  ? ?04/09/2021  ?12:30 PM ? ? ? ? ? ? ? ?

## 2021-04-13 ENCOUNTER — Telehealth: Payer: Self-pay | Admitting: Internal Medicine

## 2021-04-13 NOTE — Telephone Encounter (Signed)
Appointment for Tuesday changed to a mychart video visit. Called patient to informed her. Left her a voicemail to call office back.  ?

## 2021-04-13 NOTE — Telephone Encounter (Signed)
Yes ok 

## 2021-04-13 NOTE — Telephone Encounter (Signed)
Called patient and she is wondering if her appointment that she has with Dr Annamaria Boots on Tuesday can be a MyChart visit. She states that she is having car issues. I advised patient that I would have to ask Dr Annamaria Boots first.  ? ?Dr Annamaria Boots are you okay with this.  ? ?I believe Donah Driver is with you on this day in office.  ?

## 2021-04-16 ENCOUNTER — Telehealth: Payer: Self-pay | Admitting: Internal Medicine

## 2021-04-16 NOTE — Telephone Encounter (Signed)
ATC patient to see who her DME is since [facility] was not able to take her, LMTCB ? ?If patient calls back please ask who her DME is ?

## 2021-04-16 NOTE — Progress Notes (Signed)
?Subjective:  ? ? Patient ID: [name], female    DOB: [birth year], 71 y.o.   MRN: [id number] ? ?HPI ?F never smoker followed for asthma, allergic rhinitis,, OSA, obesity/hypoventilation, complicated by GERD, arthritis, HBP, Colon Ca/ L hemicolectomy ?NPSG 12/12/05-AHI 38.4/hour, desaturation to 66%, body weight 250 pounds ? ?--------------------------------------------------------------------------- ? ? ?09/12/20- Virtual Visit via Telephone Note ? ?I connected with [name] on 09/12/20 at 10:30 AM EDT by telephone and verified that I am speaking with the correct person using two identifiers. ?She was unable to manage video meeting. ? ?Location: ?Patient: Home ?Provider: Office ?  ?I discussed the limitations, risks, security and privacy concerns of performing an evaluation and management service by telephone and the availability of in person appointments. I also discussed with the patient that there may be a patient responsible charge related to this service. The patient expressed understanding and agreed to proceed. ? ? ?History of Present Illness: ?71 year old female never smoker followed for OSA, complicated by asthma, allergic rhinitis,  obesity/hypoventilation,  GERD, arthritis, HBP, Colon Cancer/left ,   Allergy vaccine- Dr Ernst Bowler ?Pending foot surgery tomorrow- has no concerns or acute health issues affecting this.  ?Her CPAP machine is about 72 years old and has leak and wiring connection problems. [facility] doesn't have a convenient office- lives in [place]. We discussed possilbe change to more local DME.  She reports comfortable with CPAP. ?CPAP 9/[facility]  ?Download- ? ?Covid vax- ? ?CT chest 09/13/19- ?IMPRESSION: ?1. No evidence to suggest metastatic disease to the chest. ?2. Small hiatal hernia ?  ?Observations/Objective: ? ? ?Assessment and Plan: ? ? ?Follow Up Instructions: ? ?  ?I discussed the assessment and treatment plan with the patient. The patient was provided an opportunity to  ask questions and all were answered. The patient agreed with the plan and demonstrated an understanding of the instructions. ?  ?The patient was advised to call back or seek an in-person evaluation if the symptoms worsen or if the condition fails to improve as anticipated. ? ?I provided 18 minutes of non-face-to-face time during this encounter. ? ? ?Baird Lyons, MD ? ? ?04/17/21- phone ? ?I connected with [name] on 04/16/21 at 10:00 AM EDT by a telemedicine application and verified that I am speaking with the correct person using two identifiers. ? ?Location: ?Patient: Home ?Provider: Office ?  ?I discussed the limitations of evaluation and management by telemedicine and the availability of in person appointments. The patient expressed understanding and agreed to proceed. ? ?History of Present Illness ?71 year old female never smoker followed for OSA, complicated by asthma, allergic rhinitis,  obesity/hypoventilation,  GERD, arthritis, HBP, Colon Cancer/left ,   Allergy vaccine- Dr Ernst Bowler ?CPAP 9/ [facility] ?Download compliance- not available ?Covid vax-3 Moderna ?Flu vax-had ?She could not come for in person visit today because of car trouble, living in [place]. ?She tries to use CPAP every night but says that the motor now cuts off frequently during her sleep over the past 3-4 months.  She had considered changing her DME provider to accompany more convenient for her in [place] but has not done so. ?Denies active breathing problems otherwise. ?  ?Observations/Objective: ? ? ?Assessment and Plan: ? ? ?Follow Up Instructions: ? ?  ?I discussed the assessment and treatment plan with the patient. The patient was provided an opportunity to ask questions and all were answered. The patient agreed with the plan and demonstrated an understanding of the instructions. ?  ?The patient was advised to call  back or seek an in-person evaluation if the symptoms worsen or if the condition fails to improve as  anticipated. ? ?I provided 15 minutes of non-face-to-face time during this encounter. ? ? ?Baird Lyons, MD ? ? ? ? ?ROS-see HPI   + = positive ?Constitutional:   No-   weight loss, night sweats, fevers, chills, fatigue, lassitude. ?HEENT:   No-  headaches, difficulty swallowing, tooth/dental problems, sore throat,  ?     No-  sneezing, itching, ear ache, nasal congestion, + post nasal drip,  ?CV:  No-   chest pain, orthopnea, PND, swelling in lower extremities, anasarca, dizziness, palpitations ?Resp: No- acute  shortness of breath with exertion or at rest.   ?           No-   productive cough,  + non-productive cough,  No- coughing up of blood.   ?           No-   change in color of mucus.  No-sustained wheezing.   ?Skin: No-   rash or lesions. ?GI:  + heartburn, indigestion, no-abdominal pain, nausea, vomiting,  ?GU: No  complaint ?MS:  No-   joint pain or swelling.   ?Neuro-     nothing unusual ?Psych:  No- change in mood or affect. No depression or anxiety.  No memory loss. ? ?OBJ- Physical Exam   similar to previous exams ?General- Alert, Oriented, Affect-appropriate, Distress- none acute, +obese ?Skin- rash-none, lesions- none, excoriation- none ?Lymphadenopathy- none ?Head- atraumatic ?           Eyes- Gross vision intact, PERRLA, conjunctivae and secretions clear ?           Ears- Hearing, canals-normal ?           Nose- Clear, no-Septal dev, mucus, polyps, erosion, perforation  ?           Throat- Mallampati III , mucosa clear,not dry , drainage- none, tonsils- atrophic,  ?Neck- flexible , trachea midline, no stridor , thyroid nl, carotid no bruit ?Chest - symmetrical excursion , unlabored ?          Heart/CV- RRR , no murmur , no gallop  , no rub, nl s1 s2 ?                          - JVD- none , edema- none, stasis changes- none, varices- none ?          Lung- clear to P&A, wheeze- none, cough-none , dullness-none, rub- none ?          Chest wall-  ?Abd-  ?Br/ Gen/ Rectal- Not done, not  indicated ?Extrem- cyanosis- none, clubbing, none, atrophy- none, strength- nl ?Neuro- grossly intact to observation ? ? ?

## 2021-04-17 ENCOUNTER — Ambulatory Visit: Payer: Self-pay | Admitting: Internal Medicine

## 2021-04-17 ENCOUNTER — Encounter: Payer: Self-pay | Admitting: Internal Medicine

## 2021-04-17 ENCOUNTER — Telehealth (INDEPENDENT_AMBULATORY_CARE_PROVIDER_SITE_OTHER): Payer: Medicare Other | Admitting: Internal Medicine

## 2021-04-17 ENCOUNTER — Other Ambulatory Visit: Payer: Self-pay

## 2021-04-17 DIAGNOSIS — J4520 Mild intermittent asthma, uncomplicated: Secondary | ICD-10-CM

## 2021-04-17 DIAGNOSIS — G4733 Obstructive sleep apnea (adult) (pediatric): Secondary | ICD-10-CM | POA: Diagnosis not present

## 2021-04-17 NOTE — Assessment & Plan Note (Signed)
Her machine is reaching its 5-year limit we think.  She reports benefit with improved sleep from using it. ?Plan-when eligible, replace old CPAP machine, changing to auto 5-15.  Meanwhile hopefully [facility] can service current machine. ?

## 2021-04-17 NOTE — Patient Instructions (Addendum)
Order- DME [facility] please service current CPAP- machine is cutting off in the night. ?           Please replace old machine when eligible, changing it autopap 5-15, mask of choice, humidifier, supplies, AirView/ card ? ? ? ? ? ? ?Please call if we can help ?

## 2021-04-17 NOTE — Assessment & Plan Note (Signed)
She indicates she feels well controlled with no recent exacerbation. ?Plan-no changes.  Continues allergy vaccine from her allergist. ?

## 2021-04-18 ENCOUNTER — Ambulatory Visit (INDEPENDENT_AMBULATORY_CARE_PROVIDER_SITE_OTHER): Payer: Medicare Other

## 2021-04-18 DIAGNOSIS — J309 Allergic rhinitis, unspecified: Secondary | ICD-10-CM | POA: Diagnosis not present

## 2021-04-25 ENCOUNTER — Telehealth: Payer: Self-pay | Admitting: Internal Medicine

## 2021-04-25 NOTE — Telephone Encounter (Signed)
Spoke to patient.  ?She stated that her cpap machine cuts off during the night. [facility] serviced machine and machine is still cutting off. ?Order placed to [facility] to service machine or replace machine.  ?Spoke to [place] with [employer]. He stated that he would forward order for pap team. ?Patient is aware and voiced her understanding.  ?Nothing further needed.  ? ?

## 2021-05-17 ENCOUNTER — Ambulatory Visit (INDEPENDENT_AMBULATORY_CARE_PROVIDER_SITE_OTHER): Payer: Medicare Other

## 2021-05-17 DIAGNOSIS — J309 Allergic rhinitis, unspecified: Secondary | ICD-10-CM | POA: Diagnosis not present

## 2021-06-14 ENCOUNTER — Ambulatory Visit (INDEPENDENT_AMBULATORY_CARE_PROVIDER_SITE_OTHER): Payer: Medicare Other

## 2021-06-14 DIAGNOSIS — J309 Allergic rhinitis, unspecified: Secondary | ICD-10-CM

## 2021-06-20 DIAGNOSIS — J3081 Allergic rhinitis due to animal (cat) (dog) hair and dander: Secondary | ICD-10-CM | POA: Diagnosis not present

## 2021-06-20 NOTE — Progress Notes (Signed)
VIALS EXP 06-21-22

## 2021-06-21 DIAGNOSIS — J3089 Other allergic rhinitis: Secondary | ICD-10-CM | POA: Diagnosis not present

## 2021-07-06 ENCOUNTER — Telehealth: Payer: Self-pay | Admitting: Internal Medicine

## 2021-07-06 NOTE — Telephone Encounter (Signed)
Orma Render would like to change RX to generic [employer]. Pharmacy is [facility]. Phone number is [phone].   Dr. Annamaria Boots is this okay with you?

## 2021-07-08 NOTE — Telephone Encounter (Signed)
Ok to change albuterol hfa to generic

## 2021-07-09 ENCOUNTER — Other Ambulatory Visit: Payer: Self-pay | Admitting: *Deleted

## 2021-07-09 MED ORDER — GABAPENTIN 300 MG PO CAPS: 300.0000 mg | ORAL_CAPSULE | Freq: Two times a day (BID) | ORAL | 2 refills | Status: DC

## 2021-07-09 MED ORDER — OMEPRAZOLE 20 MG PO CPDR: 20.0000 mg | DELAYED_RELEASE_CAPSULE | Freq: Every day | ORAL | 2 refills | Status: DC

## 2021-07-09 MED ORDER — FAMOTIDINE 40 MG PO TABS: 40.0000 mg | ORAL_TABLET | Freq: Every day | ORAL | 3 refills | Status: DC

## 2021-07-11 MED ORDER — ALBUTEROL SULFATE HFA 108 (90 BASE) MCG/ACT IN AERS: 2.0000 | INHALATION_SPRAY | Freq: Four times a day (QID) | RESPIRATORY_TRACT | 1 refills | Status: DC | PRN

## 2021-07-11 NOTE — Telephone Encounter (Signed)
New RX has been sent to [facility].

## 2021-07-12 ENCOUNTER — Ambulatory Visit (INDEPENDENT_AMBULATORY_CARE_PROVIDER_SITE_OTHER): Payer: Medicare Other

## 2021-07-12 ENCOUNTER — Ambulatory Visit: Payer: Self-pay

## 2021-07-12 DIAGNOSIS — J309 Allergic rhinitis, unspecified: Secondary | ICD-10-CM

## 2021-07-18 ENCOUNTER — Ambulatory Visit (INDEPENDENT_AMBULATORY_CARE_PROVIDER_SITE_OTHER): Payer: Medicare Other | Admitting: Internal Medicine

## 2021-07-18 ENCOUNTER — Encounter: Payer: Self-pay | Admitting: Internal Medicine

## 2021-07-18 DIAGNOSIS — I10 Essential (primary) hypertension: Secondary | ICD-10-CM | POA: Diagnosis not present

## 2021-07-18 DIAGNOSIS — Z85038 Personal history of other malignant neoplasm of large intestine: Secondary | ICD-10-CM | POA: Diagnosis not present

## 2021-07-18 DIAGNOSIS — Z6841 Body Mass Index (BMI) 40.0 and over, adult: Secondary | ICD-10-CM

## 2021-07-18 DIAGNOSIS — K219 Gastro-esophageal reflux disease without esophagitis: Secondary | ICD-10-CM | POA: Diagnosis not present

## 2021-07-18 DIAGNOSIS — R600 Localized edema: Secondary | ICD-10-CM

## 2021-07-18 MED ORDER — POTASSIUM CHLORIDE CRYS ER 20 MEQ PO TBCR: 20.0000 meq | EXTENDED_RELEASE_TABLET | Freq: Every day | ORAL | 1 refills | Status: DC | PRN

## 2021-07-18 MED ORDER — MONTELUKAST SODIUM 10 MG PO TABS
ORAL_TABLET | ORAL | 3 refills | Status: DC
Start: 2021-07-18 — End: 2022-02-06

## 2021-07-18 MED ORDER — FUROSEMIDE 40 MG PO TABS: 40.0000 mg | ORAL_TABLET | Freq: Every day | ORAL | 1 refills | Status: DC | PRN

## 2021-07-18 MED ORDER — OMEPRAZOLE 20 MG PO CPDR: 20.0000 mg | DELAYED_RELEASE_CAPSULE | Freq: Every day | ORAL | 3 refills | Status: DC

## 2021-07-18 MED ORDER — ALBUTEROL SULFATE HFA 108 (90 BASE) MCG/ACT IN AERS
INHALATION_SPRAY | RESPIRATORY_TRACT | 3 refills | Status: DC
Start: 2021-07-18 — End: 2021-07-24

## 2021-07-18 NOTE — Assessment & Plan Note (Signed)
F/u w/Dr Benay Spice q 6 months

## 2021-07-18 NOTE — Progress Notes (Signed)
Subjective:  Patient ID: [name], female    DOB: December 06, 1950  Age: 71 y.o. MRN: [id number]  CC: No chief complaint on file.   HPI [name] presents for GERD, rhinitis, asthma, HTN  Outpatient Medications Prior to Visit  Medication Sig Dispense Refill   albuterol (PROVENTIL) (2.5 MG/3ML) 0.083% nebulizer solution Take 3 mLs (2.5 mg total) by nebulization every 4 (four) hours as needed for wheezing or shortness of breath. 225 mL 3   aspirin EC 81 MG tablet Take 81 mg by mouth in the morning. Swallow whole.     cetirizine (ZYRTEC) 10 MG tablet Take 1 tablet (10 mg total) by mouth daily. (Patient taking differently: Take 10 mg by mouth at bedtime.) 90 tablet 3   Cholecalciferol (VITAMIN D) 1000 UNITS capsule Take 1,000 Units by mouth every morning.     Cyanocobalamin (VITAMIN B-12) 1000 MCG SUBL Place 1 tablet (1,000 mcg total) under the tongue daily. 100 tablet 3   EPINEPHrine 0.3 mg/0.3 mL IJ SOAJ injection Use as directed for severe allergic reaction 2 each 1   famotidine (PEPCID) 40 MG tablet Take 1 tablet (40 mg total) by mouth daily. 90 tablet 3   ferrous sulfate 325 (65 FE) MG tablet Take 325 mg by mouth in the morning and at bedtime.     fluticasone (FLONASE) 50 MCG/ACT nasal spray 1-2 sprays each nostril once daily (Patient taking differently: Place 2 sprays into both nostrils daily as needed for allergies.) 48 g 3   gabapentin (NEURONTIN) 300 MG capsule Take 1 capsule (300 mg total) by mouth 2 (two) times daily. 180 capsule 2   hydrochlorothiazide (HYDRODIURIL) 25 MG tablet TAKE 1 TABLET EVERY DAY 90 tablet 3   ipratropium (ATROVENT) 0.06 % nasal spray Place 1 spray into both nostrils 4 (four) times daily as needed (allergies.). 45 mL 3   irbesartan (AVAPRO) 150 MG tablet TAKE 1 TABLET EVERY DAY 90 tablet 3   oxyCODONE (OXY IR/ROXICODONE) 5 MG immediate release tablet Take 1 tablet (5 mg total) by mouth every 8 (eight) hours as needed for moderate pain (pain score  4-6). 21 tablet 0   potassium chloride (KLOR-CON) 10 MEQ tablet TAKE 1 TABLET EVERY DAY 90 tablet 3   SYMBICORT 160-4.5 MCG/ACT inhaler INHALE 2 PUFFS INTO THE LUNGS 2 (TWO) TIMES DAILY. RINSE, GARGLE AND SPIT OUT AFTER USE 3 each 1   albuterol (VENTOLIN HFA) 108 (90 Base) MCG/ACT inhaler INHALE 2 PUFFS BY MOUTH EVERY 6 HOURS AS NEEDED FOR  WHEEZING/SHORTNESS  OF  BREATH 18 g 9   albuterol (VENTOLIN HFA) 108 (90 Base) MCG/ACT inhaler Inhale 2 puffs into the lungs every 6 (six) hours as needed for wheezing or shortness of breath. 24 g 1   montelukast (SINGULAIR) 10 MG tablet TAKE 1 TABLET ONE TIME DAILY WITH BREAKFAST (Patient taking differently: Take 10 mg by mouth at bedtime. TAKE 1 TABLET ONE TIME DAILY WITH BREAKFAST) 90 tablet 3   omeprazole (PRILOSEC) 20 MG capsule Take 1 capsule (20 mg total) by mouth daily. 90 capsule 2   No facility-administered medications prior to visit.    ROS: Review of Systems  Constitutional:  Negative for activity change, appetite change, chills, fatigue and unexpected weight change.  HENT:  Positive for congestion. Negative for mouth sores and sinus pressure.   Eyes:  Negative for visual disturbance.  Respiratory:  Positive for wheezing. Negative for cough and chest tightness.   Gastrointestinal:  Negative for abdominal pain and nausea.  Genitourinary:  Negative for difficulty urinating, frequency and vaginal pain.  Musculoskeletal:  Positive for arthralgias and gait problem. Negative for back pain.  Skin:  Negative for pallor and rash.  Neurological:  Negative for dizziness, tremors, weakness, numbness and headaches.  Psychiatric/Behavioral:  Negative for confusion, sleep disturbance and suicidal ideas.     Objective:  BP 110/82 (BP Location: Right Arm, Patient Position: Sitting, Cuff Size: Normal)   Pulse 74   Temp 98.1 F (36.7 C) (Oral)   Ht '5\' 2"'$  (1.575 m)   Wt 203 lb (92.1 kg)   SpO2 94%   BMI 37.13 kg/m   BP Readings from Last 3 Encounters:   07/18/21 110/82  04/09/21 134/73  03/20/21 130/68    Wt Readings from Last 3 Encounters:  07/18/21 203 lb (92.1 kg)  04/09/21 204 lb (92.5 kg)  03/20/21 203 lb 12.8 oz (92.4 kg)    Physical Exam Constitutional:      General: She is not in acute distress.    Appearance: She is well-developed. She is obese.  HENT:     Head: Normocephalic.     Right Ear: External ear normal.     Left Ear: External ear normal.     Nose: Nose normal.  Eyes:     General:        Right eye: No discharge.        Left eye: No discharge.     Conjunctiva/sclera: Conjunctivae normal.     Pupils: Pupils are equal, round, and reactive to light.  Neck:     Thyroid: No thyromegaly.     Vascular: No JVD.     Trachea: No tracheal deviation.  Cardiovascular:     Rate and Rhythm: Normal rate and regular rhythm.     Heart sounds: Normal heart sounds.  Pulmonary:     Effort: No respiratory distress.     Breath sounds: No stridor. No wheezing.  Abdominal:     General: Bowel sounds are normal. There is no distension.     Palpations: Abdomen is soft. There is no mass.     Tenderness: There is no abdominal tenderness. There is no guarding or rebound.  Musculoskeletal:        General: Tenderness present.     Cervical back: Normal range of motion and neck supple. No rigidity.  Lymphadenopathy:     Cervical: No cervical adenopathy.  Skin:    Findings: No erythema or rash.  Neurological:     Mental Status: She is oriented to person, place, and time.     Cranial Nerves: No cranial nerve deficit.     Motor: No abnormal muscle tone.     Coordination: Coordination normal.     Gait: Gait abnormal.     Deep Tendon Reflexes: Reflexes normal.  Psychiatric:        Behavior: Behavior normal.        Thought Content: Thought content normal.        Judgment: Judgment normal.   Using a cane  Lab Results  Component Value Date   WBC 4.5 03/20/2021   HGB 11.8 (L) 03/20/2021   HCT 36.9 03/20/2021   PLT 246.0  03/20/2021   GLUCOSE 97 03/20/2021   CHOL 170 11/10/2018   TRIG 65.0 11/10/2018   HDL 79.00 11/10/2018   LDLCALC 78 11/10/2018   ALT 13 03/20/2021   AST 15 03/20/2021   NA 140 03/20/2021   K 3.6 03/20/2021   CL 104 03/20/2021   CREATININE 0.86  03/20/2021   BUN 11 03/20/2021   CO2 30 03/20/2021   TSH 1.57 03/20/2021   INR 1.07 02/02/2013   HGBA1C 6.1 03/20/2021    DG MINI C-ARM IMAGE ONLY  Result Date: 09/13/2020 There is no interpretation for this exam.  This order is for images obtained during a surgical procedure.  Please See "Surgeries" Tab for more information regarding the procedure.    Assessment & Plan:   Problem List Items Addressed This Visit     GERD (gastroesophageal reflux disease) (Chronic)    Cont on Omeprazole, Pepcid      Relevant Medications   omeprazole (PRILOSEC) 20 MG capsule   Edema    Use Furosemide, Kdur prn      Essential hypertension    Cont on Irbesartan, HCT, Furosemide prn, KCl prn      Relevant Medications   furosemide (LASIX) 40 MG tablet   History of colon cancer    F/u w/Dr Benay Spice q 6 months      Obesity    Wt Readings from Last 3 Encounters:  07/18/21 203 lb (92.1 kg)  04/09/21 204 lb (92.5 kg)  03/20/21 203 lb 12.8 oz (92.4 kg)           Meds ordered this encounter  Medications   furosemide (LASIX) 40 MG tablet    Sig: Take 1 tablet (40 mg total) by mouth daily as needed for edema.    Dispense:  90 tablet    Refill:  1   potassium chloride SA (KLOR-CON M) 20 MEQ tablet    Sig: Take 1 tablet (20 mEq total) by mouth daily as needed (take w/furosemide).    Dispense:  180 tablet    Refill:  1   omeprazole (PRILOSEC) 20 MG capsule    Sig: Take 1 capsule (20 mg total) by mouth daily.    Dispense:  90 capsule    Refill:  3   albuterol (VENTOLIN HFA) 108 (90 Base) MCG/ACT inhaler    Sig: INHALE 2 PUFFS BY MOUTH EVERY 6 HOURS AS NEEDED FOR  WHEEZING/SHORTNESS  OF  BREATH    Dispense:  54 g    Refill:  3    montelukast (SINGULAIR) 10 MG tablet    Sig: TAKE 1 TABLET ONE TIME DAILY WITH BREAKFAST    Dispense:  90 tablet    Refill:  3      Follow-up: Return in about 3 months (around 10/18/2021) for a follow-up visit.  Walker Kehr, MD

## 2021-07-18 NOTE — Assessment & Plan Note (Signed)
Cont on Omeprazole, Pepcid 

## 2021-07-18 NOTE — Assessment & Plan Note (Signed)
Cont on Irbesartan, HCT, Furosemide prn, KCl prn

## 2021-07-18 NOTE — Assessment & Plan Note (Signed)
Wt Readings from Last 3 Encounters:  07/18/21 203 lb (92.1 kg)  04/09/21 204 lb (92.5 kg)  03/20/21 203 lb 12.8 oz (92.4 kg)

## 2021-07-18 NOTE — Assessment & Plan Note (Signed)
Use Furosemide, Kdur prn

## 2021-07-24 ENCOUNTER — Telehealth: Payer: Self-pay | Admitting: *Deleted

## 2021-07-24 MED ORDER — ALBUTEROL SULFATE HFA 108 (90 BASE) MCG/ACT IN AERS: 2.0000 | INHALATION_SPRAY | RESPIRATORY_TRACT | 3 refills | Status: DC | PRN

## 2021-07-24 NOTE — Telephone Encounter (Signed)
Rec'd fax Ventolin HFA is not covered under your plan. We are requesting approval to change to alternative Albuterol HFA (ProAir). MD change to alternative.Marland KitchenRaechel Chute

## 2021-08-08 ENCOUNTER — Ambulatory Visit (INDEPENDENT_AMBULATORY_CARE_PROVIDER_SITE_OTHER): Payer: Medicare Other | Admitting: Allergy & Immunology

## 2021-08-08 ENCOUNTER — Encounter: Payer: Self-pay | Admitting: Allergy & Immunology

## 2021-08-08 VITALS — BP 128/70 | HR 59 | Temp 98.3°F | Resp 18 | Ht 60.5 in | Wt 202.5 lb

## 2021-08-08 DIAGNOSIS — K219 Gastro-esophageal reflux disease without esophagitis: Secondary | ICD-10-CM | POA: Diagnosis not present

## 2021-08-08 DIAGNOSIS — J4540 Moderate persistent asthma, uncomplicated: Secondary | ICD-10-CM

## 2021-08-08 DIAGNOSIS — J302 Other seasonal allergic rhinitis: Secondary | ICD-10-CM

## 2021-08-08 DIAGNOSIS — R053 Chronic cough: Secondary | ICD-10-CM | POA: Diagnosis not present

## 2021-08-08 DIAGNOSIS — J3089 Other allergic rhinitis: Secondary | ICD-10-CM | POA: Diagnosis not present

## 2021-08-08 MED ORDER — FLUTICASONE-SALMETEROL 115-21 MCG/ACT IN AERO: 2.0000 | INHALATION_SPRAY | Freq: Two times a day (BID) | RESPIRATORY_TRACT | 5 refills | Status: DC

## 2021-08-08 NOTE — Patient Instructions (Addendum)
1. Seasonal and perennial allergic rhinitis (trees, weeds, grasses, molds, dust mites, cat, dog and cockroach) - Continue allergy shots at the same schedule.  - You will reach five years of maintenance in May 2024, but we can continue past that if you think you need it.  - Continue with the daily use of the nose sprays. - Continue with Xyzal (levocetirizine) '5mg'$  tablet once daily - Continue saline nasal rinses - Continue with Tessalon perles as needed (let us know if you need refills).   2. Moderate persistent asthma, uncomplicated - You are doing an awesome job.  - We are not going to make any changes at this time.  - You have a good handle on your symptoms.  - Daily controller medication(s): Advair 115/37mg two puffs twice daily with spacer + montelukast 10 mg once a day - Prior to physical activity: albuterol 2 puffs 10-15 minutes before physical activity. - Rescue medications: albuterol 4 puffs every 4-6 hours as needed - Asthma control goals:  * Full participation in all desired activities (may need albuterol before activity) * Albuterol use two time or less a week on average (not counting use with activity) * Cough interfering with sleep two time or less a month * Oral steroids no more than once a year * No hospitalization  3. GERD - Continue avoidance of caffeine, chocolate, and peppermint intake - Continue with omeprazole in the morning. - Continue famotidine 40 mg at night.  3. Return in about 6 months (around 02/08/2022).    Please inform [place] any Emergency Department visits, hospitalizations, or changes in symptoms. Call [place]efore going to the ED for breathing or allergy symptoms since we might be able to fit you in for a sick visit. Feel free to contact u[place]anytime with any questions, problems, or concerns.  It was a pleasure to see you again today!  Websites that have reliable patient information: 1. American Academy of Asthma, Allergy, and Immunology:  www.aaaai.org 2. Food Allergy Research and Education (FARE): foodallergy.org 3. Mothers of Asthmatics: http://www.asthmacommunitynetwork.org 4. American College of Allergy, Asthma, and Immunology: www.acaai.org   COVID-19 Vaccine Information can be found at: [email or web address] questions related to vaccine distribution or appointments, please email [email or web address] or call [phone]   We realize that you might be concerned about having an allergic reaction to the COVID19 vaccines. To help with that concern, WE ARE OFFERING THE COVID19 VACCINES IN OUR OFFICE! Ask the front desk for dates!     "Like" [facility] Facebook and Instagram for our latest updates!      A healthy democracy works best when ANew [employer] Life Insuranceparticipate! Make sure you are registered to vote! If you have moved or changed any of your contact information, you will need to get this updated before voting!  In some cases, you MAY be able to register to vote online: [email or web address]
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
## 2021-08-14 ENCOUNTER — Telehealth: Payer: Self-pay

## 2021-08-14 ENCOUNTER — Other Ambulatory Visit: Payer: Self-pay

## 2021-08-14 NOTE — Telephone Encounter (Signed)
Fluticasone - Salmeterol (Advair HFA) 115-21 mcg/act not on the preferred formulary for the patient's insurance plan.  Following are the alternatives:  Advair 45-21 mcg/act and 230-21 mcg/act Symbicort 80-4.5 mcg/act and 160-4.5 mcg/act BREO Ellipta 100-25 mcg/act and 200-25 mcg/act  Forwarding to provider for next step.

## 2021-08-15 ENCOUNTER — Ambulatory Visit (INDEPENDENT_AMBULATORY_CARE_PROVIDER_SITE_OTHER): Payer: Medicare Other | Admitting: *Deleted

## 2021-08-15 DIAGNOSIS — J309 Allergic rhinitis, unspecified: Secondary | ICD-10-CM | POA: Diagnosis not present

## 2021-08-15 MED ORDER — BUDESONIDE-FORMOTEROL FUMARATE 80-4.5 MCG/ACT IN AERO: 2.0000 | INHALATION_SPRAY | Freq: Two times a day (BID) | RESPIRATORY_TRACT | 5 refills | Status: DC

## 2021-08-15 NOTE — Telephone Encounter (Signed)
Let's do Symbicort 48mg two puffs BID.  JSalvatore Marvel MD [facility]

## 2021-08-15 NOTE — Addendum Note (Signed)
Addended by: Clovis Cao A on: 08/15/2021 03:12 PM   Modules accepted: Orders

## 2021-08-15 NOTE — Telephone Encounter (Signed)
Called and spoke to patient and informed her of the medication change. Patient verbalized understanding.

## 2021-08-20 ENCOUNTER — Other Ambulatory Visit: Payer: Self-pay

## 2021-08-30 ENCOUNTER — Other Ambulatory Visit: Payer: Self-pay | Admitting: Internal Medicine

## 2021-09-13 ENCOUNTER — Ambulatory Visit (INDEPENDENT_AMBULATORY_CARE_PROVIDER_SITE_OTHER): Payer: Medicare Other

## 2021-09-13 DIAGNOSIS — J309 Allergic rhinitis, unspecified: Secondary | ICD-10-CM

## 2021-09-17 ENCOUNTER — Ambulatory Visit (INDEPENDENT_AMBULATORY_CARE_PROVIDER_SITE_OTHER): Payer: Medicare Other | Admitting: *Deleted

## 2021-09-17 DIAGNOSIS — J309 Allergic rhinitis, unspecified: Secondary | ICD-10-CM

## 2021-09-25 ENCOUNTER — Ambulatory Visit (INDEPENDENT_AMBULATORY_CARE_PROVIDER_SITE_OTHER): Payer: Medicare Other | Admitting: *Deleted

## 2021-09-25 ENCOUNTER — Other Ambulatory Visit: Payer: Self-pay | Admitting: *Deleted

## 2021-09-25 DIAGNOSIS — J309 Allergic rhinitis, unspecified: Secondary | ICD-10-CM | POA: Diagnosis not present

## 2021-10-03 ENCOUNTER — Ambulatory Visit (INDEPENDENT_AMBULATORY_CARE_PROVIDER_SITE_OTHER): Payer: Medicare Other | Admitting: *Deleted

## 2021-10-03 DIAGNOSIS — J309 Allergic rhinitis, unspecified: Secondary | ICD-10-CM | POA: Diagnosis not present

## 2021-10-08 ENCOUNTER — Ambulatory Visit (INDEPENDENT_AMBULATORY_CARE_PROVIDER_SITE_OTHER): Payer: Medicare Other

## 2021-10-08 DIAGNOSIS — J309 Allergic rhinitis, unspecified: Secondary | ICD-10-CM

## 2021-10-10 ENCOUNTER — Ambulatory Visit (HOSPITAL_BASED_OUTPATIENT_CLINIC_OR_DEPARTMENT_OTHER)
Admission: RE | Admit: 2021-10-10 | Discharge: 2021-10-10 | Disposition: A | Discharge status: Home / Self Care | Payer: Medicare Other | Source: Ambulatory Visit | Attending: Nurse Practitioner | Admitting: Nurse Practitioner

## 2021-10-10 ENCOUNTER — Inpatient Hospital Stay: Payer: Medicare Other | Attending: Oncology

## 2021-10-10 DIAGNOSIS — C184 Malignant neoplasm of transverse colon: Secondary | ICD-10-CM

## 2021-10-10 DIAGNOSIS — Z85038 Personal history of other malignant neoplasm of large intestine: Secondary | ICD-10-CM | POA: Insufficient documentation

## 2021-10-10 LAB — CMP (CANCER CENTER ONLY)
ALT: 14 U/L (ref 0–44)
AST: 15 U/L (ref 15–41)
Albumin: 4.2 g/dL (ref 3.5–5.0)
Alkaline Phosphatase: 126 U/L (ref 38–126)
Anion gap: 11 (ref 5–15)
BUN: 14 mg/dL (ref 8–23)
CO2: 26 mmol/L (ref 22–32)
Calcium: 9.4 mg/dL (ref 8.9–10.3)
Chloride: 105 mmol/L (ref 98–111)
Creatinine: 0.88 mg/dL (ref 0.44–1.00)
GFR, Estimated: >60 mL/min (ref >60)
Glucose, Bld: 99 mg/dL (ref 70–99)
Potassium: 3.8 mmol/L (ref 3.5–5.1)
Sodium: 142 mmol/L (ref 135–145)
Total Bilirubin: 0.5 mg/dL (ref 0.3–1.2)
Total Protein: 7.3 g/dL (ref 6.5–8.1)

## 2021-10-10 LAB — CEA (ACCESS): CEA (CHCC): 1.78 ng/mL (ref 0.00–5.00)

## 2021-10-10 MED ORDER — IOHEXOL 300 MG/ML  SOLN
100.0000 mL | Freq: Once | INTRAMUSCULAR | Status: AC | PRN
  Administered 2021-10-10: 85 mL via INTRAVENOUS

## 2021-10-15 ENCOUNTER — Ambulatory Visit (INDEPENDENT_AMBULATORY_CARE_PROVIDER_SITE_OTHER): Payer: Medicare Other | Admitting: Internal Medicine

## 2021-10-15 ENCOUNTER — Inpatient Hospital Stay: Payer: Medicare Other | Admitting: Oncology

## 2021-10-15 ENCOUNTER — Encounter: Payer: Self-pay | Admitting: Internal Medicine

## 2021-10-15 VITALS — BP 130/70 | HR 65 | Temp 98.1°F | Ht 62.5 in | Wt 200.4 lb

## 2021-10-15 VITALS — BP 137/65 | HR 80 | Temp 98.2°F | Resp 18 | Ht 65.0 in | Wt 201.6 lb

## 2021-10-15 DIAGNOSIS — C184 Malignant neoplasm of transverse colon: Secondary | ICD-10-CM

## 2021-10-15 DIAGNOSIS — Z85038 Personal history of other malignant neoplasm of large intestine: Secondary | ICD-10-CM | POA: Diagnosis present

## 2021-10-15 DIAGNOSIS — K219 Gastro-esophageal reflux disease without esophagitis: Secondary | ICD-10-CM | POA: Diagnosis not present

## 2021-10-15 DIAGNOSIS — Z23 Encounter for immunization: Secondary | ICD-10-CM | POA: Diagnosis not present

## 2021-10-15 DIAGNOSIS — I10 Essential (primary) hypertension: Secondary | ICD-10-CM

## 2021-10-15 DIAGNOSIS — Z6841 Body Mass Index (BMI) 40.0 and over, adult: Secondary | ICD-10-CM

## 2021-10-15 DIAGNOSIS — E66813 Obesity, class 3: Secondary | ICD-10-CM

## 2021-10-15 NOTE — Assessment & Plan Note (Signed)
Wt Readings from Last 3 Encounters:  10/15/21 200 lb 6.4 oz (90.9 kg)  10/15/21 201 lb 9.6 oz (91.4 kg)  08/08/21 202 lb 8 oz (91.9 kg)  Cont on diet

## 2021-10-15 NOTE — Assessment & Plan Note (Signed)
Chronic  Cont on Irbesartan, HCT, Furosemide prn, KCl  .

## 2021-10-15 NOTE — Progress Notes (Signed)
[facility] OFFICE PROGRESS NOTE   Diagnosis: Colon cancer  INTERVAL HISTORY:   [name] returns as scheduled.  She feels well.  Good appetite.  No difficulty with bowel function.  No bleeding.  She continues to recover from right foot tendon surgery performed in August of last year.  Objective:  Vital signs in last 24 hours:  Blood pressure 137/65, pulse 80, temperature 98.2 F (36.8 C), temperature source Oral, resp. rate 18, height $RemoveBe'5\' 5"'kqriuddxN$  (1.651 m), weight 201 lb 9.6 oz (91.4 kg), SpO2 98 %.    Lymphatics: No cervical, supraclavicular, axillary, or inguinal nodes Resp: Lungs clear bilaterally  Cardio: Regular rate and rhythm GI: No hepatosplenomegaly, nontender Vascular: No leg edema    Lab Results:  Lab Results  Component Value Date   WBC 4.5 03/20/2021   HGB 11.8 (L) 03/20/2021   HCT 36.9 03/20/2021   MCV 73.7 (L) 03/20/2021   PLT 246.0 03/20/2021   NEUTROABS 2.8 03/20/2021    CMP  Lab Results  Component Value Date   NA 142 10/10/2021   K 3.8 10/10/2021   CL 105 10/10/2021   CO2 26 10/10/2021   GLUCOSE 99 10/10/2021   BUN 14 10/10/2021   CREATININE 0.88 10/10/2021   CALCIUM 9.4 10/10/2021   PROT 7.3 10/10/2021   ALBUMIN 4.2 10/10/2021   AST 15 10/10/2021   ALT 14 10/10/2021   ALKPHOS 126 10/10/2021   BILITOT 0.5 10/10/2021   GFRNONAA >60 10/10/2021   GFRAA >60 03/09/2018    Lab Results  Component Value Date   CEA1 1.46 09/11/2020   CEA 1.78 10/10/2021   Medications: I have reviewed the patient's current medications.   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   Presentation to the emergency room 02/02/2013 with a colonic obstruction secondary to  tumor at the splenic flexure.   Baseline CEA on 02/02/2013 less than 0.5.   Initiation of adjuvant Xeloda 04/10/2013.   Cycle 2 adjuvant Xeloda 05/01/2013.   Cycle 3 adjuvant Xeloda 05/22/2013.   Cycle 4 adjuvant Xeloda 06/12/2013.   Cycle 5 adjuvant Xeloda 07/03/2013 (Xeloda dose reduced due to hand foot syndrome).   Cycle 6 adjuvant Xeloda 07/24/2013.   Cycle 7 adjuvant Xeloda 08/14/2013   Cycle 8 adjuvant Xeloda 09/07/2013.  Surveillance colonoscopy 11/26/2016-patent end to side colocolonic anastomosis, characterized by healthy-appearing mucosa.  Examination otherwise normal.  Repeat colonoscopy in 5 years for surveillance.  History of iron deficiency anemia. Recurrent anemia 02/01/2014, improved, Persistent red cell microcytosis Asthma. Hand-foot syndrome secondary to Xeloda. Status post ostomy reversal 11/25/2013. CT 02/01/2014 with no evidence of local tumor recurrence or metastatic disease. New area of masslike thickening and small bowel dilatation at the mid small bowel Status post deep enteroscopy at [facility] on 03/16/2014, negative. Status post capsule endoscopy 03/22/2014, confirmed a small bowel tumor Exploratory laparotomy with resection of a small bowel mass on 04/06/2014 with the pathology confirming invasive adenocarcinoma extending through small bowel wall and involving adjacent loops of adherent small bowel, 2 of 12 lymph nodes positive. History consistent with recurrent colon cancer. Loss of MLH1 and PMS 2, MSI high as was the January 2015 tumor CT abdomen/pelvis 02/03/2015-no evidence of recurrent colon cancer, ventral hernias CT chest/abdomen/pelvis 03/09/2018-no evidence of recurrent or metastatic disease.  Nonspecific 2 mm  posterior right upper lobe nodule.  Continued attention on follow-up exams suggested. CT chest 09/13/2019-no evidence of metastatic disease CTs 09/11/2020-no evidence of thoracic metastases, no evidence of metastatic disease in the abdomen or pelvis CTs  10/10/2021-no evidence of metastatic disease      Disposition: [name] remains in clinical remission from colon cancer.  The restaging CTs showed no evidence of recurrent disease.  She will return for an office visit in 6 months.  Betsy Coder, MD  10/15/2021  12:37 PM

## 2021-10-15 NOTE — Assessment & Plan Note (Signed)
Recent abd CT and labs were OK

## 2021-10-15 NOTE — Assessment & Plan Note (Signed)
Cont on Omeprazole, Pepcid  Potential benefits of a long term PPI use as well as potential risks  and complications were explained to the patient and were aknowledged

## 2021-10-15 NOTE — Progress Notes (Signed)
Subjective:  Patient ID: [name], female    DOB: 04/21/1950  Age: 71 y.o. MRN: [id number]  CC: Follow-up (3 month f/u- Flu shot)   HPI Sydne R Hone presents for HTN, h/o colon cancer, GERD  Outpatient Medications Prior to Visit  Medication Sig Dispense Refill   albuterol (PROVENTIL) (2.5 MG/3ML) 0.083% nebulizer solution Take 3 mLs (2.5 mg total) by nebulization every 4 (four) hours as needed for wheezing or shortness of breath. 225 mL 3   albuterol (VENTOLIN HFA) 108 (90 Base) MCG/ACT inhaler Inhale 2 puffs into the lungs every 4 (four) hours as needed for wheezing or shortness of breath. 3 g 3   aspirin EC 81 MG tablet Take 81 mg by mouth in the morning. Swallow whole.     budesonide-formoterol (SYMBICORT) 80-4.5 MCG/ACT inhaler Inhale 2 puffs into the lungs 2 (two) times daily. 10.2 g 5   cetirizine (ZYRTEC) 10 MG tablet Take 1 tablet (10 mg total) by mouth daily. (Patient taking differently: Take 10 mg by mouth at bedtime.) 90 tablet 3   Cholecalciferol (VITAMIN D) 1000 UNITS capsule Take 1,000 Units by mouth every morning.     Cyanocobalamin (VITAMIN B-12) 1000 MCG SUBL Place 1 tablet (1,000 mcg total) under the tongue daily. 100 tablet 3   EPINEPHrine 0.3 mg/0.3 mL IJ SOAJ injection Use as directed for severe allergic reaction (Patient not taking: Reported on 10/15/2021) 2 each 1   famotidine (PEPCID) 40 MG tablet Take 1 tablet (40 mg total) by mouth daily. 90 tablet 3   ferrous sulfate 325 (65 FE) MG tablet Take 325 mg by mouth in the morning and at bedtime.     fluticasone (FLONASE) 50 MCG/ACT nasal spray 1-2 sprays each nostril once daily (Patient taking differently: Place 2 sprays into both nostrils daily as needed for allergies.) 48 g 3   furosemide (LASIX) 40 MG tablet TAKE 1 TABLET BY MOUTH DAILY AS  NEEDED FOR EDEMA 100 tablet 2   gabapentin (NEURONTIN) 300 MG capsule Take 1 capsule (300 mg total) by mouth 2 (two) times daily. 180 capsule 2    hydrochlorothiazide (HYDRODIURIL) 25 MG tablet TAKE 1 TABLET BY MOUTH DAILY 100 tablet 2   ipratropium (ATROVENT) 0.06 % nasal spray Place 1 spray into both nostrils 4 (four) times daily as needed (allergies.). 45 mL 3   irbesartan (AVAPRO) 150 MG tablet TAKE 1 TABLET BY MOUTH DAILY 100 tablet 2   montelukast (SINGULAIR) 10 MG tablet TAKE 1 TABLET ONE TIME DAILY WITH BREAKFAST 90 tablet 3   omeprazole (PRILOSEC) 20 MG capsule Take 1 capsule (20 mg total) by mouth daily. 90 capsule 3   oxyCODONE (OXY IR/ROXICODONE) 5 MG immediate release tablet Take 1 tablet (5 mg total) by mouth every 8 (eight) hours as needed for moderate pain (pain score 4-6). (Patient not taking: Reported on 08/08/2021) 21 tablet 0   potassium chloride (KLOR-CON) 10 MEQ tablet TAKE 1 TABLET EVERY DAY 90 tablet 3   potassium chloride SA (KLOR-CON M) 20 MEQ tablet Take 1 tablet (20 mEq total) by mouth daily as needed (take w/furosemide). (Patient not taking: Reported on 08/08/2021) 180 tablet 1   No facility-administered medications prior to visit.    ROS: Review of Systems  Constitutional:  Negative for activity change, appetite change, chills, fatigue and unexpected weight change.  HENT:  Negative for congestion, mouth sores and sinus pressure.   Eyes:  Negative for visual disturbance.  Respiratory:  Negative for cough and chest tightness.  Gastrointestinal:  Negative for abdominal pain and nausea.  Genitourinary:  Negative for difficulty urinating, frequency and vaginal pain.  Musculoskeletal:  Negative for back pain and gait problem.  Skin:  Negative for pallor and rash.  Neurological:  Negative for dizziness, tremors, weakness, numbness and headaches.  Psychiatric/Behavioral:  Negative for confusion and sleep disturbance.     Objective:  BP 130/70 (BP Location: Left Arm)   Pulse 65   Temp 98.1 F (36.7 C) (Oral)   Ht 5' 2.5" (1.588 m)   Wt 200 lb 6.4 oz (90.9 kg)   SpO2 96%   BMI 36.07 kg/m   BP Readings  from Last 3 Encounters:  10/15/21 130/70  10/15/21 137/65  08/08/21 128/70    Wt Readings from Last 3 Encounters:  10/15/21 200 lb 6.4 oz (90.9 kg)  10/15/21 201 lb 9.6 oz (91.4 kg)  08/08/21 202 lb 8 oz (91.9 kg)    Physical Exam Constitutional:      General: She is not in acute distress.    Appearance: She is well-developed. She is obese.  HENT:     Head: Normocephalic.     Right Ear: External ear normal.     Left Ear: External ear normal.     Nose: Nose normal.  Eyes:     General:        Right eye: No discharge.        Left eye: No discharge.     Conjunctiva/sclera: Conjunctivae normal.     Pupils: Pupils are equal, round, and reactive to light.  Neck:     Thyroid: No thyromegaly.     Vascular: No JVD.     Trachea: No tracheal deviation.  Cardiovascular:     Rate and Rhythm: Normal rate and regular rhythm.     Heart sounds: Normal heart sounds.  Pulmonary:     Effort: No respiratory distress.     Breath sounds: No stridor. No wheezing.  Abdominal:     General: Bowel sounds are normal. There is no distension.     Palpations: Abdomen is soft. There is no mass.     Tenderness: There is no abdominal tenderness. There is no guarding or rebound.  Musculoskeletal:        General: No tenderness.     Cervical back: Normal range of motion and neck supple. No rigidity.  Lymphadenopathy:     Cervical: No cervical adenopathy.  Skin:    Findings: No erythema or rash.  Neurological:     Cranial Nerves: No cranial nerve deficit.     Motor: No abnormal muscle tone.     Coordination: Coordination normal.     Deep Tendon Reflexes: Reflexes normal.  Psychiatric:        Behavior: Behavior normal.        Thought Content: Thought content normal.        Judgment: Judgment normal.     Lab Results  Component Value Date   WBC 4.5 03/20/2021   HGB 11.8 (L) 03/20/2021   HCT 36.9 03/20/2021   PLT 246.0 03/20/2021   GLUCOSE 99 10/10/2021   CHOL 170 11/10/2018   TRIG 65.0  11/10/2018   HDL 79.00 11/10/2018   LDLCALC 78 11/10/2018   ALT 14 10/10/2021   AST 15 10/10/2021   NA 142 10/10/2021   K 3.8 10/10/2021   CL 105 10/10/2021   CREATININE 0.88 10/10/2021   BUN 14 10/10/2021   CO2 26 10/10/2021   TSH 1.57 03/20/2021   INR  1.07 02/02/2013   HGBA1C 6.1 03/20/2021    CT CHEST ABDOMEN PELVIS W CONTRAST  Result Date: 10/11/2021 CLINICAL DATA:  Restaging of colon cancer. * Tracking Code: BO * EXAM: CT CHEST, ABDOMEN, AND PELVIS WITH CONTRAST TECHNIQUE: Multidetector CT imaging of the chest, abdomen and pelvis was performed following the standard protocol during bolus administration of intravenous contrast. RADIATION DOSE REDUCTION: This exam was performed according to the departmental dose-optimization program which includes automated exposure control, adjustment of the mA and/or kV according to patient size and/or use of iterative reconstruction technique. CONTRAST:  55m OMNIPAQUE IOHEXOL 300 MG/ML  SOLN COMPARISON:  September 11, 2020 FINDINGS: CT CHEST FINDINGS Cardiovascular: Stable appearance of the heart great vessels without aortic dilation. Normal caliber of central pulmonary vessels. Mediastinum/Nodes: No thoracic inlet, axillary, mediastinal or hilar adenopathy. Esophagus grossly normal. Lungs/Pleura: Mild basilar atelectasis. Airways are patent. No signs of pleural effusion or suspicious pulmonary nodule. Musculoskeletal: No chest wall mass. See below for full musculoskeletal details. CT ABDOMEN PELVIS FINDINGS Hepatobiliary: Smooth hepatic contours. No focal, suspicious hepatic lesion. The portal vein is patent. Post cholecystectomy without substantial biliary duct distension. Pancreas: Normal, without mass, inflammation or ductal dilatation. Spleen: Normal. Adrenals/Urinary Tract: Adrenal glands are unremarkable. Symmetric renal enhancement. No sign of hydronephrosis. No suspicious renal lesion or perinephric stranding. Urinary bladder is grossly unremarkable.  Stomach/Bowel: Small hiatal hernia. No signs of bowel obstruction or acute bowel process. Evidence of prior partial bowel resection in the LEFT upper quadrant with patulous anastomotic site involving the small bowel is unchanged, no signs of obstruction or inflammation. Small bowel herniation into the RIGHT lower quadrant ostomy site also unchanged and without acute process. Appendix not visualized and no secondary signs to suggest acute appendicitis. Signs of partial colonic resection with foreshortening of the colon and tortuosity. Colon confined to the midline of the abdomen and RIGHT lower quadrant. No signs of colonic obstruction. No pericolonic stranding. Vascular/Lymphatic: Aorta with smooth contours. IVC with smooth contours. No aneurysmal dilation of the abdominal aorta. There is no gastrohepatic or hepatoduodenal ligament lymphadenopathy. No retroperitoneal or mesenteric lymphadenopathy. No pelvic sidewall lymphadenopathy. Reproductive: Post hysterectomy. Other: RIGHT lower quadrant hernia at the previous ostomy site. No ascites. Musculoskeletal: No acute bone finding. No destructive bone process. Spinal degenerative changes. Degenerative changes of sacroiliac joints similar to previous imaging. Degenerative changes of glenohumeral joints RIGHT greater than LEFT IMPRESSION: 1. No evidence of metastatic disease within the chest, abdomen or pelvis. 2. Signs of prior partial colonic resection, LEFT hemicolectomy. 3. Signs of prior partial small-bowel resection as before. 4. Small hiatal hernia. 5. Post cholecystectomy. 6. RIGHT lower quadrant ostomy site with small bowel herniation into the ostomy site, also unchanged and without acute process. Electronically Signed   By: GZetta BillsM.D.   On: 10/11/2021 13:11    Assessment & Plan:   Problem List Items Addressed This Visit     Essential hypertension    Chronic  Cont on Irbesartan, HCT, Furosemide prn, KCl  .      Relevant Orders    Comprehensive metabolic panel   CBC with Differential/Platelet   TSH   Hemoglobin A1c   Lipid panel   Urinalysis   GERD (gastroesophageal reflux disease) - Primary (Chronic)    Cont on Omeprazole, Pepcid  Potential benefits of a long term PPI use as well as potential risks  and complications were explained to the patient and were aknowledged      Relevant Orders   Comprehensive metabolic panel  CBC with Differential/Platelet   TSH   Hemoglobin A1c   Lipid panel   Urinalysis   History of colon cancer    Recent abd CT and labs were OK      Relevant Orders   Comprehensive metabolic panel   CBC with Differential/Platelet   TSH   Hemoglobin A1c   Lipid panel   Urinalysis   Obesity    Wt Readings from Last 3 Encounters:  10/15/21 200 lb 6.4 oz (90.9 kg)  10/15/21 201 lb 9.6 oz (91.4 kg)  08/08/21 202 lb 8 oz (91.9 kg)  Cont on diet       Other Visit Diagnoses     Needs flu shot       Relevant Orders   Flu Vaccine QUAD High Dose(Fluad) (Completed)         No orders of the defined types were placed in this encounter.     Follow-up: Return in about 4 months (around 02/14/2022) for a follow-up visit.  Walker Kehr, MD

## 2021-10-16 ENCOUNTER — Ambulatory Visit: Payer: Medicare Other | Admitting: Internal Medicine

## 2021-10-29 ENCOUNTER — Ambulatory Visit (INDEPENDENT_AMBULATORY_CARE_PROVIDER_SITE_OTHER): Payer: Medicare Other

## 2021-10-29 DIAGNOSIS — J309 Allergic rhinitis, unspecified: Secondary | ICD-10-CM | POA: Diagnosis not present

## 2021-11-06 ENCOUNTER — Telehealth: Payer: Self-pay | Admitting: Internal Medicine

## 2021-11-07 NOTE — Telephone Encounter (Signed)
Left message for [name] last order was 03/2021 do they need a new one or what

## 2021-11-13 NOTE — Telephone Encounter (Signed)
Sent another message to [employer]

## 2021-11-26 NOTE — Telephone Encounter (Signed)
What I had ordered at March 04/17/21 video visit was to replace old CPAP machine when eligible, changing to auto 5-15.  I did not order CCPAP 10 and the paper [employer] sent indicates I signed it "January 22, 2022 at 03:33 AM", which seems unlikely unless they were rying to file in advance for when her current machine reaches 72 years old.

## 2021-11-26 NOTE — Telephone Encounter (Signed)
Spoke with Charleston Ropes, with [employer]  She states that she is calling to confirm CPAP pressure change  She says that they received pressure change (setting of 10 cm) via [employer] on 11/21/21  I do not see this in the pt's chart and explained to her that this is not how we change settings, we send referral  She is going to fax me a copy of what she is seeing  Will review and discuss with Dr Annamaria Boots  Will hold in my basket

## 2021-11-26 NOTE — Telephone Encounter (Signed)
Dr Annamaria Boots- please advise if you wanted to change pt's CPAP to 10 cm

## 2021-11-27 NOTE — Telephone Encounter (Signed)
Called [employer] and had to [facility].

## 2021-11-30 ENCOUNTER — Encounter: Payer: Self-pay | Admitting: Internal Medicine

## 2021-12-04 ENCOUNTER — Ambulatory Visit (INDEPENDENT_AMBULATORY_CARE_PROVIDER_SITE_OTHER): Payer: Medicare Other

## 2021-12-04 DIAGNOSIS — J309 Allergic rhinitis, unspecified: Secondary | ICD-10-CM | POA: Diagnosis not present

## 2021-12-31 ENCOUNTER — Ambulatory Visit (INDEPENDENT_AMBULATORY_CARE_PROVIDER_SITE_OTHER): Payer: Medicare Other

## 2021-12-31 DIAGNOSIS — J309 Allergic rhinitis, unspecified: Secondary | ICD-10-CM | POA: Diagnosis not present

## 2022-01-23 DIAGNOSIS — J3081 Allergic rhinitis due to animal (cat) (dog) hair and dander: Secondary | ICD-10-CM

## 2022-01-23 NOTE — Progress Notes (Signed)
VIALS EXP 01-24-23 

## 2022-01-24 DIAGNOSIS — J3089 Other allergic rhinitis: Secondary | ICD-10-CM | POA: Diagnosis not present

## 2022-01-29 ENCOUNTER — Ambulatory Visit (INDEPENDENT_AMBULATORY_CARE_PROVIDER_SITE_OTHER): Payer: Medicare Other

## 2022-01-29 DIAGNOSIS — J309 Allergic rhinitis, unspecified: Secondary | ICD-10-CM

## 2022-02-04 ENCOUNTER — Other Ambulatory Visit: Payer: Self-pay | Admitting: *Deleted

## 2022-02-04 MED ORDER — GABAPENTIN 300 MG PO CAPS: 300.0000 mg | ORAL_CAPSULE | Freq: Two times a day (BID) | ORAL | 2 refills | Status: DC

## 2022-02-04 MED ORDER — IRBESARTAN 150 MG PO TABS: 150.0000 mg | ORAL_TABLET | Freq: Every day | ORAL | 2 refills | Status: DC

## 2022-02-04 MED ORDER — FAMOTIDINE 40 MG PO TABS: 40.0000 mg | ORAL_TABLET | Freq: Every day | ORAL | 2 refills | Status: DC

## 2022-02-04 MED ORDER — HYDROCHLOROTHIAZIDE 25 MG PO TABS: 25.0000 mg | ORAL_TABLET | Freq: Every day | ORAL | 2 refills | Status: DC

## 2022-02-04 MED ORDER — POTASSIUM CHLORIDE CRYS ER 20 MEQ PO TBCR: 20.0000 meq | EXTENDED_RELEASE_TABLET | Freq: Every day | ORAL | 0 refills | Status: DC | PRN

## 2022-02-04 NOTE — Addendum Note (Signed)
Addended by: Earnstine Regal on: 02/04/2022 04:19 PM   Modules accepted: Orders

## 2022-02-05 MED ORDER — OMEPRAZOLE 20 MG PO CPDR: 20.0000 mg | DELAYED_RELEASE_CAPSULE | Freq: Every day | ORAL | 2 refills | Status: DC

## 2022-02-05 NOTE — Addendum Note (Signed)
Addended by: Earnstine Regal on: 02/05/2022 04:49 PM   Modules accepted: Orders

## 2022-02-06 ENCOUNTER — Ambulatory Visit (INDEPENDENT_AMBULATORY_CARE_PROVIDER_SITE_OTHER): Payer: Medicare PPO | Admitting: Allergy & Immunology

## 2022-02-06 ENCOUNTER — Encounter: Payer: Self-pay | Admitting: Allergy & Immunology

## 2022-02-06 ENCOUNTER — Other Ambulatory Visit: Payer: Self-pay

## 2022-02-06 VITALS — BP 110/80 | HR 84 | Temp 99.1°F | Resp 20 | Ht 62.0 in | Wt 204.1 lb

## 2022-02-06 DIAGNOSIS — J4540 Moderate persistent asthma, uncomplicated: Secondary | ICD-10-CM

## 2022-02-06 DIAGNOSIS — R053 Chronic cough: Secondary | ICD-10-CM | POA: Diagnosis not present

## 2022-02-06 DIAGNOSIS — J3089 Other allergic rhinitis: Secondary | ICD-10-CM

## 2022-02-06 DIAGNOSIS — K219 Gastro-esophageal reflux disease without esophagitis: Secondary | ICD-10-CM | POA: Diagnosis not present

## 2022-02-06 DIAGNOSIS — J302 Other seasonal allergic rhinitis: Secondary | ICD-10-CM

## 2022-02-06 MED ORDER — FLUTICASONE PROPIONATE 50 MCG/ACT NA SUSP: 1.0000 | Freq: Every day | NASAL | 1 refills | Status: DC

## 2022-02-06 MED ORDER — CETIRIZINE HCL 10 MG PO TABS: 10.0000 mg | ORAL_TABLET | Freq: Every day | ORAL | 1 refills | Status: DC

## 2022-02-06 MED ORDER — ALBUTEROL SULFATE HFA 108 (90 BASE) MCG/ACT IN AERS: 2.0000 | INHALATION_SPRAY | RESPIRATORY_TRACT | 1 refills | Status: DC | PRN

## 2022-02-06 MED ORDER — OMEPRAZOLE 20 MG PO CPDR: 20.0000 mg | DELAYED_RELEASE_CAPSULE | Freq: Every day | ORAL | 1 refills | Status: DC

## 2022-02-06 MED ORDER — EPINEPHRINE 0.3 MG/0.3ML IJ SOAJ: INTRAMUSCULAR | 1 refills | Status: DC

## 2022-02-06 MED ORDER — MONTELUKAST SODIUM 10 MG PO TABS: ORAL_TABLET | ORAL | 1 refills | Status: DC

## 2022-02-06 MED ORDER — IPRATROPIUM BROMIDE 0.06 % NA SOLN: 1.0000 | Freq: Four times a day (QID) | NASAL | 1 refills | Status: DC | PRN

## 2022-02-06 MED ORDER — BUDESONIDE-FORMOTEROL FUMARATE 80-4.5 MCG/ACT IN AERO: 2.0000 | INHALATION_SPRAY | Freq: Two times a day (BID) | RESPIRATORY_TRACT | 1 refills | Status: DC

## 2022-02-06 NOTE — Addendum Note (Signed)
Addended by: Larence Penning on: 02/06/2022 02:35 PM   Modules accepted: Orders

## 2022-02-06 NOTE — Patient Instructions (Addendum)
1. Seasonal and perennial allergic rhinitis (trees, weeds, grasses, molds, dust mites, cat, dog and cockroach) - Continue allergy shots at the same schedule.  - You will reach five years of maintenance in May 2024, but we can continue past that if you think you need it.  Continue with fluticasone and ipratropium one spray per nostril daily.  - Continue with cetirizine '10mg'$  tablet once daily - Continue saline nasal rinses  2. Moderate persistent asthma, uncomplicated - You are doing an awesome job.  - Lung testing looks AMAZING!  - You have a good handle on your symptoms.  - Daily controller medication(s): Symbicort 80/4.21mg two puffs twice daily with spacer + montelukast 10 mg once a day - Prior to physical activity: albuterol 2 puffs 10-15 minutes before physical activity. - Rescue medications: albuterol 4 puffs every 4-6 hours as needed - Asthma control goals:  * Full participation in all desired activities (may need albuterol before activity) * Albuterol use two time or less a week on average (not counting use with activity) * Cough interfering with sleep two time or less a month * Oral steroids no more than once a year * No hospitalization  3. GERD - Continue avoidance of caffeine, chocolate, and peppermint intake - Continue with omeprazole in the morning.  3. Return in about 6 months (around 08/07/2022).    Please inform [place] any Emergency Department visits, hospitalizations, or changes in symptoms. Call [place]efore going to the ED for breathing or allergy symptoms since we might be able to fit you in for a sick visit. Feel free to contact [place]anytime with any questions, problems, or concerns.  It was a pleasure to see you again today!  Websites that have reliable patient information: 1. American Academy of Asthma, Allergy, and Immunology: www.aaaai.org 2. Food Allergy Research and Education (FARE): foodallergy.org 3. Mothers of Asthmatics:  http://www.asthmacommunitynetwork.org 4. American College of Allergy, Asthma, and Immunology: www.acaai.org   COVID-19 Vaccine Information can be found at: [email or web address] questions related to vaccine distribution or appointments, please email [email or web address] or call [phone]   We realize that you might be concerned about having an allergic reaction to the COVID19 vaccines. To help with that concern, WE ARE OFFERING THE COVID19 VACCINES IN OUR OFFICE! Ask the front desk for dates!     "Like" [facility] Facebook and Instagram for our latest updates!      A healthy democracy works best when ANew York Life Insuranceparticipate! Make sure you are registered to vote! If you have moved or changed any of your contact information, you will need to get this updated before voting!  In some cases, you MAY be able to register to vote online: [email or web address]
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
## 2022-02-12 ENCOUNTER — Other Ambulatory Visit: Payer: Self-pay | Admitting: *Deleted

## 2022-02-19 ENCOUNTER — Encounter: Payer: Self-pay | Admitting: Internal Medicine

## 2022-02-19 ENCOUNTER — Ambulatory Visit: Payer: Medicare PPO | Admitting: Internal Medicine

## 2022-02-19 VITALS — BP 110/78 | HR 63 | Temp 98.1°F | Ht 62.0 in | Wt 198.0 lb

## 2022-02-19 DIAGNOSIS — Z85038 Personal history of other malignant neoplasm of large intestine: Secondary | ICD-10-CM

## 2022-02-19 DIAGNOSIS — J302 Other seasonal allergic rhinitis: Secondary | ICD-10-CM | POA: Diagnosis not present

## 2022-02-19 DIAGNOSIS — G8929 Other chronic pain: Secondary | ICD-10-CM

## 2022-02-19 DIAGNOSIS — K219 Gastro-esophageal reflux disease without esophagitis: Secondary | ICD-10-CM | POA: Diagnosis not present

## 2022-02-19 DIAGNOSIS — I10 Essential (primary) hypertension: Secondary | ICD-10-CM

## 2022-02-19 DIAGNOSIS — G4733 Obstructive sleep apnea (adult) (pediatric): Secondary | ICD-10-CM

## 2022-02-19 DIAGNOSIS — M545 Low back pain, unspecified: Secondary | ICD-10-CM | POA: Diagnosis not present

## 2022-02-19 DIAGNOSIS — J3089 Other allergic rhinitis: Secondary | ICD-10-CM | POA: Diagnosis not present

## 2022-02-19 DIAGNOSIS — R634 Abnormal weight loss: Secondary | ICD-10-CM

## 2022-02-19 DIAGNOSIS — D510 Vitamin B12 deficiency anemia due to intrinsic factor deficiency: Secondary | ICD-10-CM | POA: Diagnosis not present

## 2022-02-19 LAB — URINALYSIS, ROUTINE W REFLEX MICROSCOPIC
Bilirubin Urine: NEGATIVE
Hgb urine dipstick: NEGATIVE
Ketones, ur: NEGATIVE
Nitrite: NEGATIVE
RBC / HPF: NONE SEEN (ref 0–?)
Specific Gravity, Urine: 1.015 (ref 1.000–1.030)
Total Protein, Urine: NEGATIVE
Urine Glucose: NEGATIVE
Urobilinogen, UA: 0.2 (ref 0.0–1.0)
pH: 6 (ref 5.0–8.0)

## 2022-02-19 LAB — LIPID PANEL
Cholesterol: 179 mg/dL (ref 0–200)
HDL: 86.1 mg/dL (ref >39.00)
LDL Cholesterol: 78 mg/dL (ref 0–99)
NonHDL: 93.24
Total CHOL/HDL Ratio: 2
Triglycerides: 76 mg/dL (ref 0.0–149.0)
VLDL: 15.2 mg/dL (ref 0.0–40.0)

## 2022-02-19 LAB — COMPREHENSIVE METABOLIC PANEL
ALT: 13 U/L (ref 0–35)
AST: 16 U/L (ref 0–37)
Albumin: 4.1 g/dL (ref 3.5–5.2)
Alkaline Phosphatase: 125 U/L — ABNORMAL HIGH (ref 39–117)
BUN: 17 mg/dL (ref 6–23)
CO2: 29 mEq/L (ref 19–32)
Calcium: 9.8 mg/dL (ref 8.4–10.5)
Chloride: 105 mEq/L (ref 96–112)
Creatinine, Ser: 0.81 mg/dL (ref 0.40–1.20)
GFR: 72.38 mL/min (ref >60.00)
Glucose, Bld: 97 mg/dL (ref 70–99)
Potassium: 4.1 mEq/L (ref 3.5–5.1)
Sodium: 142 mEq/L (ref 135–145)
Total Bilirubin: 0.4 mg/dL (ref 0.2–1.2)
Total Protein: 7.5 g/dL (ref 6.0–8.3)

## 2022-02-19 LAB — CBC WITH DIFFERENTIAL/PLATELET
Basophils Absolute: 0 10*3/uL (ref 0.0–0.1)
Basophils Relative: 1 % (ref 0.0–3.0)
Eosinophils Absolute: 0.1 10*3/uL (ref 0.0–0.7)
Eosinophils Relative: 1.7 % (ref 0.0–5.0)
HCT: 38 % (ref 36.0–46.0)
Hemoglobin: 12.5 g/dL (ref 12.0–15.0)
Lymphocytes Relative: 27.6 % (ref 12.0–46.0)
Lymphs Abs: 1.3 10*3/uL (ref 0.7–4.0)
MCHC: 32.8 g/dL (ref 30.0–36.0)
MCV: 73.2 fl — ABNORMAL LOW (ref 78.0–100.0)
Monocytes Absolute: 0.6 10*3/uL (ref 0.1–1.0)
Monocytes Relative: 11.7 % (ref 3.0–12.0)
Neutro Abs: 2.7 10*3/uL (ref 1.4–7.7)
Neutrophils Relative %: 58 % (ref 43.0–77.0)
Platelets: 241 10*3/uL (ref 150.0–400.0)
RBC: 5.19 Mil/uL — ABNORMAL HIGH (ref 3.87–5.11)
RDW: 17.1 % — ABNORMAL HIGH (ref 11.5–15.5)
WBC: 4.7 10*3/uL (ref 4.0–10.5)

## 2022-02-19 LAB — HEMOGLOBIN A1C: Hgb A1c MFr Bld: 6.1 % (ref 4.6–6.5)

## 2022-02-19 LAB — TSH: TSH: 2.66 u[IU]/mL (ref 0.35–5.50)

## 2022-02-19 MED ORDER — CETIRIZINE HCL 10 MG PO TABS: 10.0000 mg | ORAL_TABLET | Freq: Every day | ORAL | 3 refills | Status: DC

## 2022-02-19 NOTE — Assessment & Plan Note (Signed)
Cont on Omeprazole, Pepcid

## 2022-02-19 NOTE — Assessment & Plan Note (Signed)
Losing wt on diet On CPAP

## 2022-02-19 NOTE — Assessment & Plan Note (Signed)
  F/u w/Dr Benay Spice

## 2022-02-19 NOTE — Progress Notes (Signed)
Subjective:  Patient ID: [name], female    DOB: 1950/02/25  Age: 72 y.o. MRN: [id number]  CC: No chief complaint on file.   HPI [name] presents for allergies, colon cancer, GERD  Losing wt on diet  Outpatient Medications Prior to Visit  Medication Sig Dispense Refill   albuterol (PROVENTIL) (2.5 MG/3ML) 0.083% nebulizer solution Take 3 mLs (2.5 mg total) by nebulization every 4 (four) hours as needed for wheezing or shortness of breath. 225 mL 3   albuterol (VENTOLIN HFA) 108 (90 Base) MCG/ACT inhaler Inhale 2 puffs into the lungs every 4 (four) hours as needed for wheezing or shortness of breath. 2 each 1   aspirin EC 81 MG tablet Take 81 mg by mouth in the morning. Swallow whole.     budesonide-formoterol (SYMBICORT) 80-4.5 MCG/ACT inhaler Inhale 2 puffs into the lungs 2 (two) times daily. 3 each 1   Cholecalciferol (VITAMIN D) 1000 UNITS capsule Take 1,000 Units by mouth every morning.     Cyanocobalamin (VITAMIN B-12) 1000 MCG SUBL Place 1 tablet (1,000 mcg total) under the tongue daily. 100 tablet 3   EPINEPHrine 0.3 mg/0.3 mL IJ SOAJ injection Use as directed for severe allergic reaction 2 each 1   famotidine (PEPCID) 40 MG tablet Take 1 tablet (40 mg total) by mouth daily. 90 tablet 2   ferrous sulfate 325 (65 FE) MG tablet Take 325 mg by mouth in the morning and at bedtime.     fluticasone (FLONASE) 50 MCG/ACT nasal spray Place 1 spray into both nostrils daily. 1-2 sprays each nostril once daily 48 g 1   furosemide (LASIX) 40 MG tablet TAKE 1 TABLET BY MOUTH DAILY AS  NEEDED FOR EDEMA 100 tablet 2   gabapentin (NEURONTIN) 300 MG capsule Take 1 capsule (300 mg total) by mouth 2 (two) times daily. 180 capsule 2   hydrochlorothiazide (HYDRODIURIL) 25 MG tablet Take 1 tablet (25 mg total) by mouth daily. 100 tablet 2   ipratropium (ATROVENT) 0.06 % nasal spray Place 1 spray into both nostrils 4 (four) times daily as needed (allergies.). 45 mL 1   irbesartan  (AVAPRO) 150 MG tablet Take 1 tablet (150 mg total) by mouth daily. 100 tablet 2   montelukast (SINGULAIR) 10 MG tablet TAKE 1 TABLET ONE TIME DAILY WITH BREAKFAST 90 tablet 1   omeprazole (PRILOSEC) 20 MG capsule Take 1 capsule (20 mg total) by mouth daily. 90 capsule 1   potassium chloride (KLOR-CON) 10 MEQ tablet TAKE 1 TABLET EVERY DAY 90 tablet 3   potassium chloride SA (KLOR-CON M) 20 MEQ tablet Take 1 tablet (20 mEq total) by mouth daily as needed (take w/furosemide). 180 tablet 0   cetirizine (ZYRTEC) 10 MG tablet Take 1 tablet (10 mg total) by mouth daily. 90 tablet 1   No facility-administered medications prior to visit.    ROS: Review of Systems  Constitutional:  Positive for unexpected weight change. Negative for activity change, appetite change, chills and fatigue.  HENT:  Negative for congestion, mouth sores and sinus pressure.   Eyes:  Negative for visual disturbance.  Respiratory:  Negative for cough and chest tightness.   Gastrointestinal:  Negative for abdominal pain and nausea.  Genitourinary:  Negative for difficulty urinating, frequency and vaginal pain.  Musculoskeletal:  Negative for back pain and gait problem.  Skin:  Negative for pallor and rash.  Neurological:  Negative for dizziness, tremors, weakness, numbness and headaches.  Psychiatric/Behavioral:  Negative for confusion and  sleep disturbance.     Objective:  BP 110/78 (BP Location: Right Arm, Patient Position: Sitting, Cuff Size: Normal)   Pulse 63   Temp 98.1 F (36.7 C) (Oral)   Ht '5\' 2"'$  (1.575 m)   Wt 198 lb (89.8 kg)   SpO2 95%   BMI 36.21 kg/m   BP Readings from Last 3 Encounters:  02/19/22 110/78  02/06/22 110/80  10/15/21 130/70    Wt Readings from Last 3 Encounters:  02/19/22 198 lb (89.8 kg)  02/06/22 204 lb 2 oz (92.6 kg)  10/15/21 200 lb 6.4 oz (90.9 kg)    Physical Exam Constitutional:      General: She is not in acute distress.    Appearance: She is well-developed. She is  obese.  HENT:     Head: Normocephalic.     Right Ear: External ear normal.     Left Ear: External ear normal.     Nose: Nose normal.  Eyes:     General:        Right eye: No discharge.        Left eye: No discharge.     Conjunctiva/sclera: Conjunctivae normal.     Pupils: Pupils are equal, round, and reactive to light.  Neck:     Thyroid: No thyromegaly.     Vascular: No JVD.     Trachea: No tracheal deviation.  Cardiovascular:     Rate and Rhythm: Normal rate and regular rhythm.     Heart sounds: Normal heart sounds.  Pulmonary:     Effort: No respiratory distress.     Breath sounds: No stridor. No wheezing.  Abdominal:     General: Bowel sounds are normal. There is no distension.     Palpations: Abdomen is soft. There is no mass.     Tenderness: There is no abdominal tenderness. There is no guarding or rebound.  Musculoskeletal:        General: No tenderness.     Cervical back: Normal range of motion and neck supple. No rigidity.  Lymphadenopathy:     Cervical: No cervical adenopathy.  Skin:    Findings: No erythema or rash.  Neurological:     Cranial Nerves: No cranial nerve deficit.     Motor: No abnormal muscle tone.     Coordination: Coordination normal.     Deep Tendon Reflexes: Reflexes normal.  Psychiatric:        Behavior: Behavior normal.        Thought Content: Thought content normal.        Judgment: Judgment normal.     Lab Results  Component Value Date   WBC 4.5 03/20/2021   HGB 11.8 (L) 03/20/2021   HCT 36.9 03/20/2021   PLT 246.0 03/20/2021   GLUCOSE 99 10/10/2021   CHOL 170 11/10/2018   TRIG 65.0 11/10/2018   HDL 79.00 11/10/2018   LDLCALC 78 11/10/2018   ALT 14 10/10/2021   AST 15 10/10/2021   NA 142 10/10/2021   K 3.8 10/10/2021   CL 105 10/10/2021   CREATININE 0.88 10/10/2021   BUN 14 10/10/2021   CO2 26 10/10/2021   TSH 1.57 03/20/2021   INR 1.07 02/02/2013   HGBA1C 6.1 03/20/2021    CT CHEST ABDOMEN PELVIS W  CONTRAST  Result Date: 10/11/2021 CLINICAL DATA:  Restaging of colon cancer. * Tracking Code: BO * EXAM: CT CHEST, ABDOMEN, AND PELVIS WITH CONTRAST TECHNIQUE: Multidetector CT imaging of the chest, abdomen and pelvis was performed following  the standard protocol during bolus administration of intravenous contrast. RADIATION DOSE REDUCTION: This exam was performed according to the departmental dose-optimization program which includes automated exposure control, adjustment of the mA and/or kV according to patient size and/or use of iterative reconstruction technique. CONTRAST:  8m OMNIPAQUE IOHEXOL 300 MG/ML  SOLN COMPARISON:  September 11, 2020 FINDINGS: CT CHEST FINDINGS Cardiovascular: Stable appearance of the heart great vessels without aortic dilation. Normal caliber of central pulmonary vessels. Mediastinum/Nodes: No thoracic inlet, axillary, mediastinal or hilar adenopathy. Esophagus grossly normal. Lungs/Pleura: Mild basilar atelectasis. Airways are patent. No signs of pleural effusion or suspicious pulmonary nodule. Musculoskeletal: No chest wall mass. See below for full musculoskeletal details. CT ABDOMEN PELVIS FINDINGS Hepatobiliary: Smooth hepatic contours. No focal, suspicious hepatic lesion. The portal vein is patent. Post cholecystectomy without substantial biliary duct distension. Pancreas: Normal, without mass, inflammation or ductal dilatation. Spleen: Normal. Adrenals/Urinary Tract: Adrenal glands are unremarkable. Symmetric renal enhancement. No sign of hydronephrosis. No suspicious renal lesion or perinephric stranding. Urinary bladder is grossly unremarkable. Stomach/Bowel: Small hiatal hernia. No signs of bowel obstruction or acute bowel process. Evidence of prior partial bowel resection in the LEFT upper quadrant with patulous anastomotic site involving the small bowel is unchanged, no signs of obstruction or inflammation. Small bowel herniation into the RIGHT lower quadrant ostomy site also  unchanged and without acute process. Appendix not visualized and no secondary signs to suggest acute appendicitis. Signs of partial colonic resection with foreshortening of the colon and tortuosity. Colon confined to the midline of the abdomen and RIGHT lower quadrant. No signs of colonic obstruction. No pericolonic stranding. Vascular/Lymphatic: Aorta with smooth contours. IVC with smooth contours. No aneurysmal dilation of the abdominal aorta. There is no gastrohepatic or hepatoduodenal ligament lymphadenopathy. No retroperitoneal or mesenteric lymphadenopathy. No pelvic sidewall lymphadenopathy. Reproductive: Post hysterectomy. Other: RIGHT lower quadrant hernia at the previous ostomy site. No ascites. Musculoskeletal: No acute bone finding. No destructive bone process. Spinal degenerative changes. Degenerative changes of sacroiliac joints similar to previous imaging. Degenerative changes of glenohumeral joints RIGHT greater than LEFT IMPRESSION: 1. No evidence of metastatic disease within the chest, abdomen or pelvis. 2. Signs of prior partial colonic resection, LEFT hemicolectomy. 3. Signs of prior partial small-bowel resection as before. 4. Small hiatal hernia. 5. Post cholecystectomy. 6. RIGHT lower quadrant ostomy site with small bowel herniation into the ostomy site, also unchanged and without acute process. Electronically Signed   By: GZetta BillsM.D.   On: 10/11/2021 13:11    Assessment & Plan:   Problem List Items Addressed This Visit       Cardiovascular and Mediastinum   Essential hypertension - Primary    Chronic  Cont on Irbesartan, HCT, Furosemide prn, KCl  Losing wt on diet        Respiratory   Obstructive sleep apnea    Losing wt on diet On CPAP      Seasonal and perennial allergic rhinitis    On Zyrtec        Digestive   GERD (gastroesophageal reflux disease) (Chronic)    Cont on Omeprazole, Pepcid        Other   Low back pain    Better       Pernicious  anemia    On B12      Personal history of colon cancer     F/u w/Dr SBenay Spice     Weight loss    On diet Check labs  Meds ordered this encounter  Medications   DISCONTD: cetirizine (ZYRTEC) 10 MG tablet    Sig: Take 1 tablet (10 mg total) by mouth daily.    Dispense:  90 tablet    Refill:  3   cetirizine (ZYRTEC) 10 MG tablet    Sig: Take 1 tablet (10 mg total) by mouth daily.    Dispense:  90 tablet    Refill:  3      Follow-up: Return in about 3 months (around 05/21/2022) for a follow-up visit.  Walker Kehr, MD

## 2022-02-19 NOTE — Assessment & Plan Note (Signed)
On diet Check labs

## 2022-02-19 NOTE — Assessment & Plan Note (Signed)
On B12 

## 2022-02-19 NOTE — Assessment & Plan Note (Signed)
On Zyrtec

## 2022-02-19 NOTE — Assessment & Plan Note (Signed)
Better  

## 2022-02-19 NOTE — Assessment & Plan Note (Signed)
Chronic  Cont on Irbesartan, HCT, Furosemide prn, KCl  Losing wt on diet

## 2022-02-25 ENCOUNTER — Ambulatory Visit (INDEPENDENT_AMBULATORY_CARE_PROVIDER_SITE_OTHER): Payer: Medicare PPO

## 2022-02-25 DIAGNOSIS — J4540 Moderate persistent asthma, uncomplicated: Secondary | ICD-10-CM

## 2022-03-06 ENCOUNTER — Other Ambulatory Visit: Payer: Self-pay | Admitting: Internal Medicine

## 2022-03-21 ENCOUNTER — Telehealth: Payer: Self-pay

## 2022-03-21 NOTE — Telephone Encounter (Signed)
Contacted [name] to schedule their annual wellness visit. Appointment made for 04/04/22.  Norton Blizzard, [place] ([facility])  [facility] [phone]

## 2022-03-25 ENCOUNTER — Ambulatory Visit (INDEPENDENT_AMBULATORY_CARE_PROVIDER_SITE_OTHER): Payer: Medicare PPO | Admitting: *Deleted

## 2022-03-25 DIAGNOSIS — J309 Allergic rhinitis, unspecified: Secondary | ICD-10-CM | POA: Diagnosis not present

## 2022-04-04 ENCOUNTER — Ambulatory Visit (INDEPENDENT_AMBULATORY_CARE_PROVIDER_SITE_OTHER): Payer: Medicare PPO

## 2022-04-04 VITALS — Ht 62.0 in | Wt 198.0 lb

## 2022-04-04 DIAGNOSIS — Z0000 Encounter for general adult medical examination without abnormal findings: Secondary | ICD-10-CM

## 2022-04-04 NOTE — Patient Instructions (Addendum)
[name] , Thank you for taking time to come for your Medicare Wellness Visit. I appreciate your ongoing commitment to your health goals. Please review the following plan we discussed and let me know if I can assist you in the future.   These are the goals we discussed:  Goals      Client understands the importance of follow-up with providers by attending scheduled visits.        This is a list of the screening recommended for you and due dates:  Health Maintenance  Topic Date Due   Hepatitis C Screening: USPSTF Recommendation to screen - Ages 83-79 yo.  Never done   Mammogram  03/29/2014   DTaP/Tdap/Td vaccine (2 - Tdap) 08/26/2021   Colon Cancer Screening  11/26/2021   Medicare Annual Wellness Visit  04/04/2023   Pneumonia Vaccine  Completed   Flu Shot  Completed   DEXA scan (bone density measurement)  Completed   HPV Vaccine  Aged Out   COVID-19 Vaccine  Discontinued   Zoster (Shingles) Vaccine  Discontinued    Advanced directives: No  Conditions/risks identified: Yes  Next appointment: Follow up in one year for your annual wellness visit.   Preventive Care 9 Years and Older, Female Preventive care refers to lifestyle choices and visits with your health care provider that can promote health and wellness. What does preventive care include? A yearly physical exam. This is also called an annual well check. Dental exams once or twice a year. Routine eye exams. Ask your health care provider how often you should have your eyes checked. Personal lifestyle choices, including: Daily care of your teeth and gums. Regular physical activity. Eating a healthy diet. Avoiding tobacco and drug use. Limiting alcohol use. Practicing safe sex. Taking low-dose aspirin every day. Taking vitamin and mineral supplements as recommended by your health care provider. What happens during an annual well check? The services and screenings done by your health care provider during your  annual well check will depend on your age, overall health, lifestyle risk factors, and family history of disease. Counseling  Your health care provider may ask you questions about your: Alcohol use. Tobacco use. Drug use. Emotional well-being. Home and relationship well-being. Sexual activity. Eating habits. History of falls. Memory and ability to understand (cognition). Work and work Statistician. Reproductive health. Screening  You may have the following tests or measurements: Height, weight, and BMI. Blood pressure. Lipid and cholesterol levels. These may be checked every 5 years, or more frequently if you are over 63 years old. Skin check. Lung cancer screening. You may have this screening every year starting at age 15 if you have a 30-pack-year history of smoking and currently smoke or have quit within the past 15 years. Fecal occult blood test (FOBT) of the stool. You may have this test every year starting at age 52. Flexible sigmoidoscopy or colonoscopy. You may have a sigmoidoscopy every 5 years or a colonoscopy every 10 years starting at age 44. Hepatitis C blood test. Hepatitis B blood test. Sexually transmitted disease (STD) testing. Diabetes screening. This is done by checking your blood sugar (glucose) after you have not eaten for a while (fasting). You may have this done every 1-3 years. Bone density scan. This is done to screen for osteoporosis. You may have this done starting at age 24. Mammogram. This may be done every 1-2 years. Talk to your health care provider about how often you should have regular mammograms. Talk with your health care  provider about your test results, treatment options, and if necessary, the need for more tests. Vaccines  Your health care provider may recommend certain vaccines, such as: Influenza vaccine. This is recommended every year. Tetanus, diphtheria, and acellular pertussis (Tdap, Td) vaccine. You may need a Td booster every 10  years. Zoster vaccine. You may need this after age 11. Pneumococcal 13-valent conjugate (PCV13) vaccine. One dose is recommended after age 74. Pneumococcal polysaccharide (PPSV23) vaccine. One dose is recommended after age 15. Talk to your health care provider about which screenings and vaccines you need and how often you need them. This information is not intended to replace advice given to you by your health care provider. Make sure you discuss any questions you have with your health care provider. Document Released: 02/10/2015 Document Revised: 10/04/2015 Document Reviewed: 11/15/2014 Elsevier Interactive Patient Education  2017 [employer] Prevention in the Home Falls can cause injuries. They can happen to people of all ages. There are many things you can do to make your home safe and to help prevent falls. What can I do on the outside of my home? Regularly fix the edges of walkways and driveways and fix any cracks. Remove anything that might make you trip as you walk through a door, such as a raised step or threshold. Trim any bushes or trees on the path to your home. Use bright outdoor lighting. Clear any walking paths of anything that might make someone trip, such as rocks or tools. Regularly check to see if handrails are loose or broken. Make sure that both sides of any steps have handrails. Any raised decks and porches should have guardrails on the edges. Have any leaves, snow, or ice cleared regularly. Use sand or salt on walking paths during winter. Clean up any spills in your garage right away. This includes oil or grease spills. What can I do in the bathroom? Use night lights. Install grab bars by the toilet and in the tub and shower. Do not use towel bars as grab bars. Use non-skid mats or decals in the tub or shower. If you need to sit down in the shower, use a plastic, non-slip stool. Keep the floor dry. Clean up any water that spills on the floor as soon as it  happens. Remove soap buildup in the tub or shower regularly. Attach bath mats securely with double-sided non-slip rug tape. Do not have throw rugs and other things on the floor that can make you trip. What can I do in the bedroom? Use night lights. Make sure that you have a light by your bed that is easy to reach. Do not use any sheets or blankets that are too big for your bed. They should not hang down onto the floor. Have a firm chair that has side arms. You can use this for support while you get dressed. Do not have throw rugs and other things on the floor that can make you trip. What can I do in the kitchen? Clean up any spills right away. Avoid walking on wet floors. Keep items that you use a lot in easy-to-reach places. If you need to reach something above you, use a strong step stool that has a grab bar. Keep electrical cords out of the way. Do not use floor polish or wax that makes floors slippery. If you must use wax, use non-skid floor wax. Do not have throw rugs and other things on the floor that can make you trip. What can I  do with my stairs? Do not leave any items on the stairs. Make sure that there are handrails on both sides of the stairs and use them. Fix handrails that are broken or loose. Make sure that handrails are as long as the stairways. Check any carpeting to make sure that it is firmly attached to the stairs. Fix any carpet that is loose or worn. Avoid having throw rugs at the top or bottom of the stairs. If you do have throw rugs, attach them to the floor with carpet tape. Make sure that you have a light switch at the top of the stairs and the bottom of the stairs. If you do not have them, ask someone to add them for you. What else can I do to help prevent falls? Wear shoes that: Do not have high heels. Have rubber bottoms. Are comfortable and fit you well. Are closed at the toe. Do not wear sandals. If you use a stepladder: Make sure that it is fully opened.  Do not climb a closed stepladder. Make sure that both sides of the stepladder are locked into place. Ask someone to hold it for you, if possible. Clearly mark and make sure that you can see: Any grab bars or handrails. First and last steps. Where the edge of each step is. Use tools that help you move around (mobility aids) if they are needed. These include: Canes. Walkers. Scooters. Crutches. Turn on the lights when you go into a dark area. Replace any light bulbs as soon as they burn out. Set up your furniture so you have a clear path. Avoid moving your furniture around. If any of your floors are uneven, fix them. If there are any pets around you, be aware of where they are. Review your medicines with your doctor. Some medicines can make you feel dizzy. This can increase your chance of falling. Ask your doctor what other things that you can do to help prevent falls. This information is not intended to replace advice given to you by your health care provider. Make sure you discuss any questions you have with your health care provider. Document Released: 11/10/2008 Document Revised: 06/22/2015 Document Reviewed: 02/18/2014 Elsevier Interactive Patient Education  2017 [employer].
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
## 2022-04-06 ENCOUNTER — Other Ambulatory Visit: Payer: Self-pay | Admitting: Allergy & Immunology

## 2022-04-11 ENCOUNTER — Telehealth: Payer: Self-pay | Admitting: *Deleted

## 2022-04-11 NOTE — Telephone Encounter (Signed)
Called to report she has GI illness w/nausea and vomiting and diarrhea since 04/09/22. No fever. Felt better yesterday, but diarrhea returned today. She is taking only #2 Imodium/day. Informed her she can take ~ 6/day and to concentrate on clear liquids till diarrhea resolved. Reach out to PCP if she does not improve. Will reschedule her Monday routine f/u out 2-3 weeks.

## 2022-04-15 ENCOUNTER — Inpatient Hospital Stay: Payer: Medicare PPO

## 2022-04-15 ENCOUNTER — Inpatient Hospital Stay: Payer: Medicare PPO | Admitting: Nurse Practitioner

## 2022-04-17 NOTE — Progress Notes (Unsigned)
Subjective:    Patient ID: [name], female    DOB: 1950/11/14, 72 y.o.   MRN: [id number]  HPI F never smoker followed for asthma, allergic rhinitis,, OSA, obesity/hypoventilation, complicated by GERD, arthritis, HBP, Colon Ca/ L hemicolectomy NPSG 12/12/05-AHI 38.4/hour, desaturation to 66%, body weight 250 pounds  ---------------------------------------------------------------------------   09/12/20- Virtual Visit via Telephone Note  I connected with [name] on 09/12/20 at 10:30 AM EDT by telephone and verified that I am speaking with the correct person using two identifiers.  Location: Patient: Home Provider: Office   I discussed the limitations, risks, security and privacy concerns of performing an evaluation and management service by telephone and the availability of in person appointments. I also discussed with the patient that there may be a patient responsible charge related to this service. The patient expressed understanding and agreed to proceed.   History of Present Illness: 71 year old female never smoker followed for OSA, complicated by asthma, allergic rhinitis,  obesity/hypoventilation,  GERD, arthritis, HBP, Colon Cancer/left ,   Allergy vaccine- Dr Ernst Bowler Pending foot surgery tomorrow- has no concerns or acute health issues affecting this.  Her CPAP machine is about 73 years old and has leak and wiring connection problems. [facility] doesn't have a convenient office- lives in [place]. We discussed possile change to more local DME.  She reports comfortable with CPAP. CPAP 9/[facility]  Download-  Covid vax-  CT chest 09/13/19- IMPRESSION: 1. No evidence to suggest metastatic disease to the chest. 2. Small hiatal hernia   Observations/Objective:   Assessment and Plan:   Follow Up Instructions:    I discussed the assessment and treatment plan with the patient. The patient was provided an opportunity to ask questions and all were answered. The  patient agreed with the plan and demonstrated an understanding of the instructions.   The patient was advised to call back or seek an in-person evaluation if the symptoms worsen or if the condition fails to improve as anticipated.  I provided 18 minutes of non-face-to-face time during this encounter.   Melissa Lyons, MD  04/18/22- 72 year old female never smoker followed for OSA, complicated by Asthma, Allergic rhinitis,  obesity/hypoventilation,  GERD, arthritis, HBP, Colon Cancer/left ,    Allergy vaccine- Dr Ernst Bowler -Symbicort 80, Neb albuterol, Ventolin hfa, Singgulair, Arovent nasal, Flonase, Zyrtec CPAP 9/[facility]  Download compliance- Body weight today-200 lbs Covid vax- [facility] says they called her several time and she never responded to replace old CPAP last year. No download available today.  She reports a "bad cold" last week which has now resolved.  She is feeling well but comes wearing a mask. She continues to use CPAP routinely and says she sleeps better with it.  Still would like to go forward with replacing old machine.  ROS-see HPI   + = positive Constitutional:   No-   weight loss, night sweats, fevers, chills, fatigue, lassitude. HEENT:   No-  headaches, difficulty swallowing, tooth/dental problems, sore throat,       No-  sneezing, itching, ear ache, nasal congestion, + post nasal drip,  CV:  No-   chest pain, orthopnea, PND, swelling in lower extremities, anasarca, dizziness, palpitations Resp: No- acute  shortness of breath with exertion or at rest.              No-   productive cough,  + non-productive cough,  No- coughing up of blood.              No-  change in color of mucus.  No-sustained wheezing.   Skin: No-   rash or lesions. GI:  + heartburn, indigestion, no-abdominal pain, nausea, vomiting,  GU: No  complaint MS:  No-   joint pain or swelling.   Neuro-     nothing unusual Psych:  No- change in mood or affect. No depression or anxiety.  No memory  loss.  OBJ- Physical Exam   similar to previous exams General- Alert, Oriented, Affect-appropriate, Distress- none acute, +obese Skin- rash-none, lesions- none, excoriation- none Lymphadenopathy- none Head- atraumatic            Eyes- Gross vision intact, PERRLA, conjunctivae and secretions clear            Ears- Hearing, canals-normal            Nose- Clear, no-Septal dev, mucus, polyps, erosion, perforation             Throat- Mallampati III , mucosa clear,not dry , drainage- none, tonsils- atrophic,  Neck- flexible , trachea midline, no stridor , thyroid nl, carotid no bruit Chest - symmetrical excursion , unlabored           Heart/CV- RRR , no murmur , no gallop  , no rub, nl s1 s2                           - JVD- none , edema- none, stasis changes- none, varices- none           Lung- clear to P&A, wheeze- none, cough-none , dullness-none, rub- none           Chest wall-  Abd-  Br/ Gen/ Rectal- Not done, not indicated Extrem- cyanosis- none, clubbing, none, atrophy- none, strength- nl Neuro- grossly intact to observation

## 2022-04-18 ENCOUNTER — Encounter: Payer: Self-pay | Admitting: Internal Medicine

## 2022-04-18 ENCOUNTER — Ambulatory Visit: Payer: Medicare PPO | Admitting: Internal Medicine

## 2022-04-18 VITALS — BP 122/70 | HR 79 | Ht 62.0 in | Wt 200.2 lb

## 2022-04-18 DIAGNOSIS — G4733 Obstructive sleep apnea (adult) (pediatric): Secondary | ICD-10-CM

## 2022-04-18 DIAGNOSIS — J4520 Mild intermittent asthma, uncomplicated: Secondary | ICD-10-CM

## 2022-04-18 NOTE — Assessment & Plan Note (Signed)
Benefits from CPAP and reports routine use and improved sleep. Plan-replace old machine, changing to auto 5-15

## 2022-04-18 NOTE — Patient Instructions (Signed)
Order- DME [facility]- please replace old CPAP machine, change to auto 5-15, mask of choice, humidifier, supplies, AirView/ card 

## 2022-04-18 NOTE — Assessment & Plan Note (Signed)
It sounds as if she had a viral illness recently, now resolved.  Recognize onset of spring tree and early grass pollen season which may contribute.  She will continue with inhalers and report if she has problems.

## 2022-04-29 ENCOUNTER — Encounter: Payer: Self-pay | Admitting: Nurse Practitioner

## 2022-04-29 ENCOUNTER — Inpatient Hospital Stay (HOSPITAL_BASED_OUTPATIENT_CLINIC_OR_DEPARTMENT_OTHER): Payer: Medicare PPO | Admitting: Nurse Practitioner

## 2022-04-29 ENCOUNTER — Inpatient Hospital Stay: Payer: Medicare PPO | Attending: Oncology

## 2022-04-29 ENCOUNTER — Ambulatory Visit (INDEPENDENT_AMBULATORY_CARE_PROVIDER_SITE_OTHER): Payer: Medicare PPO | Admitting: *Deleted

## 2022-04-29 VITALS — BP 135/58 | HR 65 | Temp 98.1°F | Resp 18 | Ht 62.0 in | Wt 201.6 lb

## 2022-04-29 DIAGNOSIS — C185 Malignant neoplasm of splenic flexure: Secondary | ICD-10-CM | POA: Diagnosis not present

## 2022-04-29 DIAGNOSIS — Z9049 Acquired absence of other specified parts of digestive tract: Secondary | ICD-10-CM | POA: Insufficient documentation

## 2022-04-29 DIAGNOSIS — J45909 Unspecified asthma, uncomplicated: Secondary | ICD-10-CM | POA: Diagnosis not present

## 2022-04-29 DIAGNOSIS — Z933 Colostomy status: Secondary | ICD-10-CM | POA: Insufficient documentation

## 2022-04-29 DIAGNOSIS — R109 Unspecified abdominal pain: Secondary | ICD-10-CM | POA: Insufficient documentation

## 2022-04-29 DIAGNOSIS — C184 Malignant neoplasm of transverse colon: Secondary | ICD-10-CM

## 2022-04-29 DIAGNOSIS — J309 Allergic rhinitis, unspecified: Secondary | ICD-10-CM

## 2022-04-29 DIAGNOSIS — Z79899 Other long term (current) drug therapy: Secondary | ICD-10-CM | POA: Insufficient documentation

## 2022-04-29 DIAGNOSIS — D649 Anemia, unspecified: Secondary | ICD-10-CM | POA: Insufficient documentation

## 2022-04-29 LAB — CEA (ACCESS): CEA (CHCC): 1.56 ng/mL (ref 0.00–5.00)

## 2022-04-29 NOTE — Progress Notes (Signed)
[name] OFFICE PROGRESS NOTE   Diagnosis: Colon cancer  INTERVAL HISTORY:   [name] returns for follow-up.  She had a GI illness several weeks ago.  She has recovered.  Bowel habits have returned to baseline.  She denies bleeding.  She has a good appetite.  She has occasional abdominal pain related to a "hernia".  Objective:  Vital signs in last 24 hours:  Blood pressure (!) 135/58, pulse 65, temperature 98.1 F (36.7 C), temperature source Oral, resp. rate 18, height 5\' 2"  (1.575 m), weight 201 lb 9.6 oz (91.4 kg), SpO2 98 %.    Lymphatics: No palpable cervical, supraclavicular, axillary or inguinal lymph nodes. Resp: Lungs clear bilaterally. Cardio: Regular rate and rhythm. GI: Abdomen soft and nontender.  No hepatosplenomegaly. Vascular: No leg edema.   Lab Results:  Lab Results  Component Value Date   WBC 4.7 02/19/2022   HGB 12.5 02/19/2022   HCT 38.0 02/19/2022   MCV 73.2 (L) 02/19/2022   PLT 241.0 02/19/2022   NEUTROABS 2.7 02/19/2022    Imaging:  No results found.  Medications: I have reviewed the patient's current medications.  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   Presentation to the emergency room 02/02/2013 with a colonic obstruction secondary to tumor at the splenic flexure.   Baseline CEA on 02/02/2013 less than 0.5.   Initiation of adjuvant Xeloda 04/10/2013.   Cycle 2 adjuvant Xeloda 05/01/2013.   Cycle 3 adjuvant Xeloda 05/22/2013.   Cycle 4 adjuvant Xeloda 06/12/2013.   Cycle 5 adjuvant Xeloda 07/03/2013 (Xeloda dose reduced due to hand foot syndrome).   Cycle 6 adjuvant Xeloda 07/24/2013.   Cycle 7 adjuvant Xeloda 08/14/2013   Cycle 8  adjuvant Xeloda 09/07/2013.  Surveillance colonoscopy 11/26/2016-patent end to side colocolonic anastomosis, characterized by healthy-appearing mucosa.  Examination otherwise normal.  Repeat colonoscopy in 5 years for surveillance.   History of iron deficiency anemia. Recurrent anemia 02/01/2014, improved, Persistent red cell microcytosis Asthma. Hand-foot syndrome secondary to Xeloda. Status post ostomy reversal 11/25/2013. CT 02/01/2014 with no evidence of local tumor recurrence or metastatic disease. New area of masslike thickening and small bowel dilatation at the mid small bowel Status post deep enteroscopy at [facility] on 03/16/2014, negative. Status post capsule endoscopy 03/22/2014, confirmed a small bowel tumor Exploratory laparotomy with resection of a small bowel mass on 04/06/2014 with the pathology confirming invasive adenocarcinoma extending through small bowel wall and involving adjacent loops of adherent small bowel, 2 of 12 lymph nodes positive. History consistent with recurrent colon cancer. Loss of MLH1 and PMS 2, MSI high as was the January 2015 tumor CT abdomen/pelvis 02/03/2015-no evidence of recurrent colon cancer, ventral hernias CT chest/abdomen/pelvis 03/09/2018-no evidence of recurrent or metastatic disease.  Nonspecific 2 mm posterior right upper lobe nodule.  Continued attention on follow-up exams suggested. CT chest 09/13/2019-no evidence of metastatic disease CTs 09/11/2020-no evidence of thoracic metastases, no evidence of metastatic disease in the abdomen or pelvis CTs 10/10/2021-no evidence of metastatic disease  Disposition: [name] remains in clinical remission from colon cancer.  CEA is stable in normal range.  Plan for surveillance CT scans in about 6 months.  Looks like she is due for a colonoscopy.  Referral made to Dr. Carlean Purl.  She will return for follow-up in  6 months.  We are available to see her sooner if needed.    Ned Card ANP/GNP-BC   04/29/2022   1:23 PM

## 2022-04-30 ENCOUNTER — Encounter: Payer: Self-pay | Admitting: Internal Medicine

## 2022-05-10 ENCOUNTER — Ambulatory Visit (INDEPENDENT_AMBULATORY_CARE_PROVIDER_SITE_OTHER): Payer: Medicare PPO

## 2022-05-10 DIAGNOSIS — J309 Allergic rhinitis, unspecified: Secondary | ICD-10-CM | POA: Diagnosis not present

## 2022-05-13 ENCOUNTER — Ambulatory Visit (INDEPENDENT_AMBULATORY_CARE_PROVIDER_SITE_OTHER): Payer: Medicare PPO | Admitting: *Deleted

## 2022-05-13 DIAGNOSIS — J309 Allergic rhinitis, unspecified: Secondary | ICD-10-CM

## 2022-05-21 ENCOUNTER — Ambulatory Visit (INDEPENDENT_AMBULATORY_CARE_PROVIDER_SITE_OTHER): Payer: Medicare PPO | Admitting: Internal Medicine

## 2022-05-21 ENCOUNTER — Encounter: Payer: Self-pay | Admitting: Internal Medicine

## 2022-05-21 ENCOUNTER — Ambulatory Visit (INDEPENDENT_AMBULATORY_CARE_PROVIDER_SITE_OTHER): Payer: Medicare PPO

## 2022-05-21 VITALS — BP 140/86 | HR 95 | Temp 97.8°F | Ht 62.0 in | Wt 207.0 lb

## 2022-05-21 DIAGNOSIS — D510 Vitamin B12 deficiency anemia due to intrinsic factor deficiency: Secondary | ICD-10-CM | POA: Diagnosis not present

## 2022-05-21 DIAGNOSIS — Z6841 Body Mass Index (BMI) 40.0 and over, adult: Secondary | ICD-10-CM | POA: Diagnosis not present

## 2022-05-21 DIAGNOSIS — Z85038 Personal history of other malignant neoplasm of large intestine: Secondary | ICD-10-CM | POA: Diagnosis not present

## 2022-05-21 DIAGNOSIS — J309 Allergic rhinitis, unspecified: Secondary | ICD-10-CM | POA: Diagnosis not present

## 2022-05-21 DIAGNOSIS — I10 Essential (primary) hypertension: Secondary | ICD-10-CM | POA: Diagnosis not present

## 2022-05-21 DIAGNOSIS — R739 Hyperglycemia, unspecified: Secondary | ICD-10-CM

## 2022-05-21 NOTE — Assessment & Plan Note (Signed)
Wt Readings from Last 3 Encounters:  05/21/22 207 lb (93.9 kg)  04/29/22 201 lb 9.6 oz (91.4 kg)  04/18/22 200 lb 3.2 oz (90.8 kg)

## 2022-05-21 NOTE — Assessment & Plan Note (Signed)
Chronic  Cont on Irbesartan, HCT, Furosemide prn, KCl  . 

## 2022-05-21 NOTE — Assessment & Plan Note (Signed)
On B12 

## 2022-05-21 NOTE — Progress Notes (Signed)
Subjective:  Patient ID: [name], female    DOB: 25-Aug-1950  Age: 72 y.o. MRN: [id number]  CC: No chief complaint on file.   HPI Brion R Tauzin presents for HTN, asthma, cancer  Outpatient Medications Prior to Visit  Medication Sig Dispense Refill   albuterol (PROVENTIL) (2.5 MG/3ML) 0.083% nebulizer solution Take 3 mLs (2.5 mg total) by nebulization every 4 (four) hours as needed for wheezing or shortness of breath. 225 mL 3   albuterol (VENTOLIN HFA) 108 (90 Base) MCG/ACT inhaler INHALE 2 PUFFS INTO THE LUNGS EVERY 4 (FOUR) HOURS AS NEEDED FOR WHEEZING OR SHORTNESS OF BREATH. 36 g 1   aspirin EC 81 MG tablet Take 81 mg by mouth in the morning. Swallow whole.     budesonide-formoterol (SYMBICORT) 80-4.5 MCG/ACT inhaler Inhale 2 puffs into the lungs 2 (two) times daily. 3 each 1   cetirizine (ZYRTEC) 10 MG tablet Take 1 tablet (10 mg total) by mouth daily. 90 tablet 3   Cholecalciferol (VITAMIN D) 1000 UNITS capsule Take 1,000 Units by mouth every morning.     Cyanocobalamin (VITAMIN B-12) 1000 MCG SUBL Place 1 tablet (1,000 mcg total) under the tongue daily. 100 tablet 3   EPINEPHrine 0.3 mg/0.3 mL IJ SOAJ injection Use as directed for severe allergic reaction 2 each 1   famotidine (PEPCID) 40 MG tablet Take 1 tablet (40 mg total) by mouth daily. 90 tablet 2   ferrous sulfate 325 (65 FE) MG tablet Take 325 mg by mouth in the morning and at bedtime.     fluticasone (FLONASE) 50 MCG/ACT nasal spray Place 1 spray into both nostrils daily. 1-2 sprays each nostril once daily 48 g 1   furosemide (LASIX) 40 MG tablet TAKE 1 TABLET BY MOUTH DAILY AS  NEEDED FOR EDEMA 100 tablet 2   gabapentin (NEURONTIN) 300 MG capsule Take 1 capsule (300 mg total) by mouth 2 (two) times daily. 180 capsule 2   hydrochlorothiazide (HYDRODIURIL) 25 MG tablet Take 1 tablet (25 mg total) by mouth daily. 100 tablet 2   ipratropium (ATROVENT) 0.06 % nasal spray USE 1 SPRAY IN EACH NOSTRIL FOUR TIMES  DAILY AS NEEDED FOR ALLERGIES 45 mL 3   irbesartan (AVAPRO) 150 MG tablet Take 1 tablet (150 mg total) by mouth daily. 100 tablet 2   montelukast (SINGULAIR) 10 MG tablet TAKE 1 TABLET ONE TIME DAILY WITH BREAKFAST 90 tablet 1   omeprazole (PRILOSEC) 20 MG capsule Take 1 capsule (20 mg total) by mouth daily. 90 capsule 1   potassium chloride SA (KLOR-CON M) 20 MEQ tablet Take 1 tablet (20 mEq total) by mouth daily as needed (take w/furosemide). 180 tablet 0   No facility-administered medications prior to visit.    ROS: Review of Systems  Objective:  BP (!) 140/86 (BP Location: Left Arm, Patient Position: Sitting, Cuff Size: Normal)   Pulse 95   Temp 97.8 F (36.6 C) (Oral)   Ht 5\' 2"  (1.575 m)   Wt 207 lb (93.9 kg)   SpO2 95%   BMI 37.86 kg/m   BP Readings from Last 3 Encounters:  05/21/22 (!) 140/86  04/29/22 (!) 135/58  04/18/22 122/70    Wt Readings from Last 3 Encounters:  05/21/22 207 lb (93.9 kg)  04/29/22 201 lb 9.6 oz (91.4 kg)  04/18/22 200 lb 3.2 oz (90.8 kg)    Physical Exam  Lab Results  Component Value Date   WBC 4.7 02/19/2022   HGB 12.5 02/19/2022  HCT 38.0 02/19/2022   PLT 241.0 02/19/2022   GLUCOSE 97 02/19/2022   CHOL 179 02/19/2022   TRIG 76.0 02/19/2022   HDL 86.10 02/19/2022   LDLCALC 78 02/19/2022   ALT 13 02/19/2022   AST 16 02/19/2022   NA 142 02/19/2022   K 4.1 02/19/2022   CL 105 02/19/2022   CREATININE 0.81 02/19/2022   BUN 17 02/19/2022   CO2 29 02/19/2022   TSH 2.66 02/19/2022   INR 1.07 02/02/2013   HGBA1C 6.1 02/19/2022    CT CHEST ABDOMEN PELVIS W CONTRAST  Result Date: 10/11/2021 CLINICAL DATA:  Restaging of colon cancer. * Tracking Code: BO * EXAM: CT CHEST, ABDOMEN, AND PELVIS WITH CONTRAST TECHNIQUE: Multidetector CT imaging of the chest, abdomen and pelvis was performed following the standard protocol during bolus administration of intravenous contrast. RADIATION DOSE REDUCTION: This exam was performed according to  the departmental dose-optimization program which includes automated exposure control, adjustment of the mA and/or kV according to patient size and/or use of iterative reconstruction technique. CONTRAST:  85mL OMNIPAQUE IOHEXOL 300 MG/ML  SOLN COMPARISON:  September 11, 2020 FINDINGS: CT CHEST FINDINGS Cardiovascular: Stable appearance of the heart great vessels without aortic dilation. Normal caliber of central pulmonary vessels. Mediastinum/Nodes: No thoracic inlet, axillary, mediastinal or hilar adenopathy. Esophagus grossly normal. Lungs/Pleura: Mild basilar atelectasis. Airways are patent. No signs of pleural effusion or suspicious pulmonary nodule. Musculoskeletal: No chest wall mass. See below for full musculoskeletal details. CT ABDOMEN PELVIS FINDINGS Hepatobiliary: Smooth hepatic contours. No focal, suspicious hepatic lesion. The portal vein is patent. Post cholecystectomy without substantial biliary duct distension. Pancreas: Normal, without mass, inflammation or ductal dilatation. Spleen: Normal. Adrenals/Urinary Tract: Adrenal glands are unremarkable. Symmetric renal enhancement. No sign of hydronephrosis. No suspicious renal lesion or perinephric stranding. Urinary bladder is grossly unremarkable. Stomach/Bowel: Small hiatal hernia. No signs of bowel obstruction or acute bowel process. Evidence of prior partial bowel resection in the LEFT upper quadrant with patulous anastomotic site involving the small bowel is unchanged, no signs of obstruction or inflammation. Small bowel herniation into the RIGHT lower quadrant ostomy site also unchanged and without acute process. Appendix not visualized and no secondary signs to suggest acute appendicitis. Signs of partial colonic resection with foreshortening of the colon and tortuosity. Colon confined to the midline of the abdomen and RIGHT lower quadrant. No signs of colonic obstruction. No pericolonic stranding. Vascular/Lymphatic: Aorta with smooth contours. IVC  with smooth contours. No aneurysmal dilation of the abdominal aorta. There is no gastrohepatic or hepatoduodenal ligament lymphadenopathy. No retroperitoneal or mesenteric lymphadenopathy. No pelvic sidewall lymphadenopathy. Reproductive: Post hysterectomy. Other: RIGHT lower quadrant hernia at the previous ostomy site. No ascites. Musculoskeletal: No acute bone finding. No destructive bone process. Spinal degenerative changes. Degenerative changes of sacroiliac joints similar to previous imaging. Degenerative changes of glenohumeral joints RIGHT greater than LEFT IMPRESSION: 1. No evidence of metastatic disease within the chest, abdomen or pelvis. 2. Signs of prior partial colonic resection, LEFT hemicolectomy. 3. Signs of prior partial small-bowel resection as before. 4. Small hiatal hernia. 5. Post cholecystectomy. 6. RIGHT lower quadrant ostomy site with small bowel herniation into the ostomy site, also unchanged and without acute process. Electronically Signed   By: Donzetta Kohut M.D.   On: 10/11/2021 13:11    Assessment & Plan:   Problem List Items Addressed This Visit       Cardiovascular and Mediastinum   Essential hypertension - Primary    Chronic  Cont on Irbesartan, HCT,  Furosemide prn, KCl        Relevant Orders   Comprehensive metabolic panel     Other   History of colon cancer    F/u w/Dr Truett Perna q 6 months CEA q 6 months      Relevant Orders   TSH   Urinalysis   CBC with Differential/Platelet   Lipid panel   Comprehensive metabolic panel   Vitamin B12   VITAMIN D 25 Hydroxy (Vit-D Deficiency, Fractures)   Hemoglobin A1c   Obesity    Wt Readings from Last 3 Encounters:  05/21/22 207 lb (93.9 kg)  04/29/22 201 lb 9.6 oz (91.4 kg)  04/18/22 200 lb 3.2 oz (90.8 kg)        Pernicious anemia    On B12      Relevant Orders   CBC with Differential/Platelet   Vitamin B12   Other Visit Diagnoses     Hyperglycemia       Relevant Orders   Hemoglobin A1c          No orders of the defined types were placed in this encounter.     Follow-up: Return in about 4 months (around 09/20/2022) for a follow-up visit.  Sonda Primes, MD

## 2022-05-21 NOTE — Assessment & Plan Note (Signed)
F/u w/Dr Sherrill q 6 months CEA q 6 months 

## 2022-05-27 ENCOUNTER — Ambulatory Visit (INDEPENDENT_AMBULATORY_CARE_PROVIDER_SITE_OTHER): Payer: Medicare PPO

## 2022-05-27 DIAGNOSIS — J309 Allergic rhinitis, unspecified: Secondary | ICD-10-CM | POA: Diagnosis not present

## 2022-05-28 ENCOUNTER — Other Ambulatory Visit: Payer: Self-pay | Admitting: Allergy & Immunology

## 2022-06-04 ENCOUNTER — Telehealth: Payer: Self-pay

## 2022-06-04 ENCOUNTER — Ambulatory Visit (AMBULATORY_SURGERY_CENTER): Payer: Medicare PPO

## 2022-06-04 VITALS — Ht 62.0 in | Wt 207.0 lb

## 2022-06-04 DIAGNOSIS — Z85038 Personal history of other malignant neoplasm of large intestine: Secondary | ICD-10-CM

## 2022-06-04 MED ORDER — PEG 3350-KCL-NA BICARB-NACL 420 G PO SOLR
4000.0000 mL | Freq: Once | ORAL | 0 refills | Status: AC
Start: 2022-06-04 — End: 2022-06-04

## 2022-06-04 NOTE — Progress Notes (Signed)
No egg or soy allergy known to patient  No issues known to pt with past sedation with any surgeries or procedures Patient denies ever being told they had issues or difficulty with intubation  No FH of Malignant Hyperthermia Pt is not on diet pills Pt is not on  home 02  Pt is not on blood thinners  Pt denies issues with constipation  No A fib or A flutter Have any cardiac testing pending--no  Pt walks with a cane per pt she gets around pretty well   PV complete. Prep reviewed with patient. Instructions sent to mailing address on file.

## 2022-06-05 NOTE — Telephone Encounter (Signed)
Pv completed.  °

## 2022-06-18 ENCOUNTER — Telehealth: Payer: Self-pay | Admitting: Internal Medicine

## 2022-06-18 NOTE — Telephone Encounter (Signed)
Patient would like order for CPAP. Patient uses [facility].. Patient phone number is [phone].

## 2022-06-19 ENCOUNTER — Encounter: Payer: Self-pay | Admitting: Internal Medicine

## 2022-06-20 NOTE — Telephone Encounter (Signed)
Spoke to pt and she states [employer] told her to give our office a call about her cpap machine pt has not received the machine and the order was placed in March 2024. I informed pt we will look into the matter and get back in contact with her 06/21/2022 or next week. Pt verbalized understanding. Will leave encounter open.

## 2022-06-20 NOTE — Telephone Encounter (Signed)
Patient checking on message for CPAP machine. Patient phone number is [phone].

## 2022-06-20 NOTE — Telephone Encounter (Signed)
Fax number is [phone]. Patient phone number is [phone].

## 2022-06-21 NOTE — Telephone Encounter (Signed)
Called [employer] Nida Boatman ) to see about the order status for her CPAP machine. Brad informed me that her account is on HOLD because she owes a balance and that she needed to call the [facility] office to handle that matter. The order was received on 04/24/2022 per Brad. I called the patient to let her know what Nida Boatman stated and gave her the phone number to that office for her to settle her account. Patient stated understanding and nothing further needed at this time.

## 2022-06-21 NOTE — Telephone Encounter (Signed)
PT calling again about Cpap status. Please call to advise at numbers within encounter. TY.

## 2022-07-02 ENCOUNTER — Ambulatory Visit (AMBULATORY_SURGERY_CENTER): Payer: Medicare PPO | Admitting: Internal Medicine

## 2022-07-02 ENCOUNTER — Encounter: Payer: Self-pay | Admitting: Internal Medicine

## 2022-07-02 VITALS — BP 118/71 | HR 63 | Temp 97.7°F | Resp 13 | Ht 62.0 in | Wt 207.0 lb

## 2022-07-02 DIAGNOSIS — Z85038 Personal history of other malignant neoplasm of large intestine: Secondary | ICD-10-CM | POA: Diagnosis not present

## 2022-07-02 DIAGNOSIS — G4733 Obstructive sleep apnea (adult) (pediatric): Secondary | ICD-10-CM | POA: Diagnosis not present

## 2022-07-02 DIAGNOSIS — Z08 Encounter for follow-up examination after completed treatment for malignant neoplasm: Secondary | ICD-10-CM | POA: Diagnosis not present

## 2022-07-02 DIAGNOSIS — I10 Essential (primary) hypertension: Secondary | ICD-10-CM | POA: Diagnosis not present

## 2022-07-02 DIAGNOSIS — Z1211 Encounter for screening for malignant neoplasm of colon: Secondary | ICD-10-CM | POA: Diagnosis not present

## 2022-07-02 DIAGNOSIS — J449 Chronic obstructive pulmonary disease, unspecified: Secondary | ICD-10-CM | POA: Diagnosis not present

## 2022-07-02 MED ORDER — SODIUM CHLORIDE 0.9 % IV SOLN: 500.0000 mL | Freq: Once | INTRAVENOUS | Status: DC

## 2022-07-02 NOTE — Patient Instructions (Addendum)
No polyps or cancer seen today.  The cleanout was off some in parts of the colon so I will recommend a repeat exam in 3 years.  I appreciate the opportunity to care for you. Iva Boop, MD, [facility]  Thank you for letting us take care of your healthcare needs today.  YOU HAD AN ENDOSCOPIC PROCEDURE TODAY AT THE [facility]:   Refer to the procedure report that was given to you for any specific questions about what was found during the examination.  If the procedure report does not answer your questions, please call your gastroenterologist to clarify.  If you requested that your care partner not be given the details of your procedure findings, then the procedure report has been included in a sealed envelope for you to review at your convenience later.  YOU SHOULD EXPECT: Some feelings of bloating in the abdomen. Passage of more gas than usual.  Walking can help get rid of the air that was put into your GI tract during the procedure and reduce the bloating. If you had a lower endoscopy (such as a colonoscopy or flexible sigmoidoscopy) you may notice spotting of blood in your stool or on the toilet paper. If you underwent a bowel prep for your procedure, you may not have a normal bowel movement for a few days.  Please Note:  You might notice some irritation and congestion in your nose or some drainage.  This is from the oxygen used during your procedure.  There is no need for concern and it should clear up in a day or so.  SYMPTOMS TO REPORT IMMEDIATELY:  Following lower endoscopy (colonoscopy or flexible sigmoidoscopy):  Excessive amounts of blood in the stool  Significant tenderness or worsening of abdominal pains  Swelling of the abdomen that is new, acute  Fever of 100F or higher   For urgent or emergent issues, a gastroenterologist can be reached at any hour by calling [phone]. Do not use MyChart messaging for urgent concerns.    DIET:  We do recommend a small meal  at first, but then you may proceed to your regular diet.  Drink plenty of fluids but you should avoid alcoholic beverages for 24 hours.  ACTIVITY:  You should plan to take it easy for the rest of today and you should NOT DRIVE or use heavy machinery until tomorrow (because of the sedation medicines used during the test).    FOLLOW UP: Our staff will call the number listed on your records the next business day following your procedure.  We will call around 7:15- 8:00 am to check on you and address any questions or concerns that you may have regarding the information given to you following your procedure. If we do not reach you, we will leave a message.     If any biopsies were taken you will be contacted by phone or by letter within the next 1-3 weeks.  Please call us at [phone] if you have not heard about the biopsies in 3 weeks.    SIGNATURES/CONFIDENTIALITY: You and/or your care partner have signed paperwork which will be entered into your electronic medical record.  These signatures attest to the fact that that the information above on your After Visit Summary has been reviewed and is understood.  Full responsibility of the confidentiality of this discharge information lies with you and/or your care-partner.
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
## 2022-07-02 NOTE — Progress Notes (Signed)
VS by CW  Pt's states no medical or surgical changes since previsit or office visit.  

## 2022-07-02 NOTE — Progress Notes (Signed)
Uneventful anesthetic. Report to pacu rn. Vss. Care resumed by rn. 

## 2022-07-02 NOTE — Op Note (Signed)
[facility] Patient Name: [name] Procedure Date: 07/02/2022 10:46 AM MRN: [id number] Endoscopist: Iva Boop , MD, [id number] Age: 72 Referring MD:  Date of Birth: 05-02-1950 Gender: Female Account #: [id number] Procedure:                Colonoscopy Indications:              High risk colon cancer surveillance: Personal                            history of colon cancer, Last colonoscopy: 2018 Medicines:                Monitored Anesthesia Care Procedure:                Pre-Anesthesia Assessment:                           - Prior to the procedure, a History and Physical                            was performed, and patient medications and                            allergies were reviewed. The patient's tolerance of                            previous anesthesia was also reviewed. The risks                            and benefits of the procedure and the sedation                            options and risks were discussed with the patient.                            All questions were answered, and informed consent                            was obtained. Prior Anticoagulants: The patient has                            taken no anticoagulant or antiplatelet agents. ASA                            Grade Assessment: III - A patient with severe                            systemic disease. After reviewing the risks and                            benefits, the patient was deemed in satisfactory                            condition to undergo the procedure.  After obtaining informed consent, the colonoscope                            was passed under direct vision. Throughout the                            procedure, the patient's blood pressure, pulse, and                            oxygen saturations were monitored continuously. The                            PCF-HQ190L Colonoscope [id number] was introduced                            through the  anus and advanced to the the cecum,                            identified by appendiceal orifice and ileocecal                            valve. The colonoscopy was performed with moderate                            difficulty due to restricted mobility of the colon.                            The patient tolerated the procedure well. The                            quality of the bowel preparation was fair. The                            ileocecal valve, appendiceal orifice, and rectum                            were photographed. The bowel preparation used was                            GoLYTELY via split dose instruction. Scope In: 11:01:41 AM Scope Out: 11:21:07 AM Scope Withdrawal Time: 0 hours 11 minutes 42 seconds  Total Procedure Duration: 0 hours 19 minutes 26 seconds  Findings:                 The perianal and digital rectal examinations were                            normal.                           There was evidence of a prior end-to-side                            colo-colonic anastomosis in the sigmoid colon. This  was patent and was characterized by moderate                            stenosis. The anastomosis was traversed.                           The exam was otherwise without abnormality on                            direct and retroflexion views. Complications:            No immediate complications. Estimated Blood Loss:     Estimated blood loss: none. Impression:               - Preparation of the colon was fair in areas of                            left colon where prior resection was, otherwise                            adequate..                           - Patent end-to-side colo-colonic anastomosis,                            characterized by moderate stenosis.                           - The examination was otherwise normal on direct                            and retroflexion views.                           - No specimens  collected.                           - Personal history of malignant neoplasm of the                            colon. Resectred 2015 Recommendation:           - Patient has a contact number available for                            emergencies. The signs and symptoms of potential                            delayed complications were discussed with the                            patient. Return to normal activities tomorrow.                            Written discharge instructions were provided to the  patient.                           - Resume previous diet.                           - Continue present medications.                           - Repeat colonoscopy in 3 years for surveillance.                            Though this was accomplished w/ pediatric scope, I                            suggest ultraslim scope next time (fixed sigmoid)                            and extra prep. Iva Boop, MD 07/02/2022 11:36:19 AM This report has been signed electronically.

## 2022-07-03 ENCOUNTER — Telehealth: Payer: Self-pay

## 2022-07-03 NOTE — Telephone Encounter (Signed)
  Follow up Call-     07/02/2022   10:25 AM  Call back number  Post procedure Call Back phone  # [phone]  Permission to leave phone message Yes     Patient questions:  Do you have a fever, pain , or abdominal swelling? No. Pain Score  0 *  Have you tolerated food without any problems? Yes.    Have you been able to return to your normal activities? Yes.    Do you have any questions about your discharge instructions: Diet   No. Medications  No. Follow up visit  No.  Do you have questions or concerns about your Care? No.  Actions: * If pain score is 4 or above: No action needed, pain <4.

## 2022-07-08 ENCOUNTER — Ambulatory Visit (INDEPENDENT_AMBULATORY_CARE_PROVIDER_SITE_OTHER): Payer: Medicare PPO | Admitting: *Deleted

## 2022-07-08 DIAGNOSIS — M19071 Primary osteoarthritis, right ankle and foot: Secondary | ICD-10-CM | POA: Diagnosis not present

## 2022-07-08 DIAGNOSIS — G4733 Obstructive sleep apnea (adult) (pediatric): Secondary | ICD-10-CM | POA: Diagnosis not present

## 2022-07-08 DIAGNOSIS — J309 Allergic rhinitis, unspecified: Secondary | ICD-10-CM | POA: Diagnosis not present

## 2022-07-08 DIAGNOSIS — M79671 Pain in right foot: Secondary | ICD-10-CM | POA: Diagnosis not present

## 2022-07-11 ENCOUNTER — Other Ambulatory Visit: Payer: Self-pay | Admitting: Allergy & Immunology

## 2022-07-17 ENCOUNTER — Other Ambulatory Visit: Payer: Self-pay | Admitting: Allergy & Immunology

## 2022-07-19 ENCOUNTER — Ambulatory Visit: Payer: Self-pay

## 2022-07-23 ENCOUNTER — Other Ambulatory Visit: Payer: Self-pay | Admitting: Internal Medicine

## 2022-07-29 ENCOUNTER — Ambulatory Visit (INDEPENDENT_AMBULATORY_CARE_PROVIDER_SITE_OTHER): Payer: Medicare PPO

## 2022-07-29 DIAGNOSIS — J309 Allergic rhinitis, unspecified: Secondary | ICD-10-CM | POA: Diagnosis not present

## 2022-08-07 ENCOUNTER — Ambulatory Visit (INDEPENDENT_AMBULATORY_CARE_PROVIDER_SITE_OTHER): Payer: Medicare PPO | Admitting: Allergy & Immunology

## 2022-08-07 ENCOUNTER — Encounter: Payer: Self-pay | Admitting: Allergy & Immunology

## 2022-08-07 ENCOUNTER — Other Ambulatory Visit: Payer: Self-pay

## 2022-08-07 VITALS — BP 130/88 | HR 80 | Temp 98.3°F | Resp 18 | Ht 61.0 in | Wt 202.2 lb

## 2022-08-07 DIAGNOSIS — J302 Other seasonal allergic rhinitis: Secondary | ICD-10-CM | POA: Diagnosis not present

## 2022-08-07 DIAGNOSIS — G4733 Obstructive sleep apnea (adult) (pediatric): Secondary | ICD-10-CM | POA: Diagnosis not present

## 2022-08-07 DIAGNOSIS — J4540 Moderate persistent asthma, uncomplicated: Secondary | ICD-10-CM

## 2022-08-07 DIAGNOSIS — K219 Gastro-esophageal reflux disease without esophagitis: Secondary | ICD-10-CM | POA: Diagnosis not present

## 2022-08-07 DIAGNOSIS — J3089 Other allergic rhinitis: Secondary | ICD-10-CM | POA: Diagnosis not present

## 2022-08-07 DIAGNOSIS — M19071 Primary osteoarthritis, right ankle and foot: Secondary | ICD-10-CM | POA: Diagnosis not present

## 2022-08-07 DIAGNOSIS — M79671 Pain in right foot: Secondary | ICD-10-CM | POA: Diagnosis not present

## 2022-08-07 MED ORDER — CETIRIZINE HCL 10 MG PO TABS: 10.0000 mg | ORAL_TABLET | Freq: Every day | ORAL | 1 refills | Status: DC

## 2022-08-07 NOTE — Patient Instructions (Addendum)
1. Seasonal and perennial allergic rhinitis (trees, weeds, grasses, molds, dust mites, cat, dog and cockroach) - We are going to finish up the vials that are already made up and then stop. - We can always retest if symptoms resume and start shots again.  - Continue with fluticasone and ipratropium one spray per nostril daily.  - Continue with cetirizine 10mg  tablet once daily - Continue saline nasal rinses. - Call us if your nasal symptoms worsen.   2. Moderate persistent asthma, uncomplicated - You are doing an awesome job.  - Lung testing looks AMAZING!  - You have a good handle on your symptoms.  - Daily controller medication(s): Symbicort 80/4.49mcg two puffs twice daily with spacer + montelukast 10 mg once a day - Prior to physical activity: albuterol 2 puffs 10-15 minutes before physical activity. - Rescue medications: albuterol 4 puffs every 4-6 hours as needed - Asthma control goals:  * Full participation in all desired activities (may need albuterol before activity) * Albuterol use two time or less a week on average (not counting use with activity) * Cough interfering with sleep two time or less a month * Oral steroids no more than once a year * No hospitalization  3. GERD - Continue avoidance of caffeine, chocolate, and peppermint intake - Continue with omeprazole in the morning.  3. Return in about 4 months (around 12/08/2022).    Please inform us of any Emergency Department visits, hospitalizations, or changes in symptoms. Call us before going to the ED for breathing or allergy symptoms since we might be able to fit you in for a sick visit. Feel free to contact us anytime with any questions, problems, or concerns.  It was a pleasure to see you again today!  Websites that have reliable patient information: 1. American Academy of Asthma, Allergy, and Immunology: www.aaaai.org 2. Food Allergy Research and Education (FARE): foodallergy.org 3. Mothers of Asthmatics:  http://www.asthmacommunitynetwork.org 4. American College of Allergy, Asthma, and Immunology: www.acaai.org   COVID-19 Vaccine Information can be found at: [email or web address].[email or web address] For questions related to vaccine distribution or appointments, please email [email or web address] or call [phone].   We realize that you might be concerned about having an allergic reaction to the COVID19 vaccines. To help with that concern, WE ARE OFFERING THE COVID19 VACCINES IN OUR OFFICE! Ask the front desk for dates!     "Like" [place] on Facebook and Instagram for our latest updates!      A healthy democracy works best when [employer] participate! Make sure you are registered to vote! If you have moved or changed any of your contact information, you will need to get this updated before voting!  In some cases, you MAY be able to register to vote online: [email or web address]
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
## 2022-08-20 ENCOUNTER — Ambulatory Visit (INDEPENDENT_AMBULATORY_CARE_PROVIDER_SITE_OTHER): Payer: Medicare PPO | Admitting: Primary Care

## 2022-08-20 ENCOUNTER — Ambulatory Visit: Payer: Medicare PPO | Admitting: Acute Care

## 2022-08-20 ENCOUNTER — Encounter: Payer: Self-pay | Admitting: Primary Care

## 2022-08-20 VITALS — BP 120/70 | HR 78 | Temp 98.1°F | Ht 62.0 in | Wt 200.0 lb

## 2022-08-20 DIAGNOSIS — G4733 Obstructive sleep apnea (adult) (pediatric): Secondary | ICD-10-CM

## 2022-08-20 DIAGNOSIS — J4540 Moderate persistent asthma, uncomplicated: Secondary | ICD-10-CM

## 2022-08-20 MED ORDER — BUDESONIDE-FORMOTEROL FUMARATE 80-4.5 MCG/ACT IN AERO: 2.0000 | INHALATION_SPRAY | Freq: Two times a day (BID) | RESPIRATORY_TRACT | 3 refills | Status: DC

## 2022-08-20 NOTE — Assessment & Plan Note (Signed)
-   Well controlled; Received Luna CPAP machine in Spring 2024. Patient is compliant with CPAP use and reports benefit from PAP therapy. Requesting download for compliance report with [employer]. Current pressure auto 5-15cm h20.  Advised patient continue her CPAP nightly for 4 to 6 hours or longer.  Focus on side sleeping position.  Encouraged weight loss efforts.  Follow-up annually.

## 2022-08-20 NOTE — Assessment & Plan Note (Addendum)
-   Stable; No acute respiratory complaints. Rare SABA use. ACE score 21. - Symbicort 80 mcg 2 puffs twice daily and as needed albuterol hfa 2 puffs every 4-6 hours for breakthrough shortness of breath or wheezing

## 2022-08-20 NOTE — Patient Instructions (Signed)
Sleep apnea is currently well-controlled on CPAP No changes today, continue all medications as prescribed We have requested a download from your Luna CPAP machine with [facility] Keep follow-up with Dr. Maple Hudson in March as scheduled
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
## 2022-08-26 ENCOUNTER — Telehealth: Payer: Self-pay | Admitting: *Deleted

## 2022-08-26 NOTE — Telephone Encounter (Signed)
Patient called to request her lab/CT on same day as office visit due to coming from [place]. This was scheduled her her request. Informed her that MD will not have final report on her scan until next day, but he can look at images with her for any obvious changes.

## 2022-09-05 ENCOUNTER — Other Ambulatory Visit: Payer: Self-pay | Admitting: Allergy & Immunology

## 2022-09-07 ENCOUNTER — Other Ambulatory Visit: Payer: Self-pay | Admitting: Internal Medicine

## 2022-09-07 DIAGNOSIS — G4733 Obstructive sleep apnea (adult) (pediatric): Secondary | ICD-10-CM | POA: Diagnosis not present

## 2022-09-07 DIAGNOSIS — M19071 Primary osteoarthritis, right ankle and foot: Secondary | ICD-10-CM | POA: Diagnosis not present

## 2022-09-07 DIAGNOSIS — M79671 Pain in right foot: Secondary | ICD-10-CM | POA: Diagnosis not present

## 2022-09-17 ENCOUNTER — Ambulatory Visit (INDEPENDENT_AMBULATORY_CARE_PROVIDER_SITE_OTHER): Payer: Medicare PPO | Admitting: Internal Medicine

## 2022-09-17 ENCOUNTER — Encounter: Payer: Self-pay | Admitting: Internal Medicine

## 2022-09-17 VITALS — BP 102/64 | HR 78 | Temp 98.3°F | Ht 62.0 in | Wt 198.0 lb

## 2022-09-17 DIAGNOSIS — D510 Vitamin B12 deficiency anemia due to intrinsic factor deficiency: Secondary | ICD-10-CM | POA: Diagnosis not present

## 2022-09-17 DIAGNOSIS — I10 Essential (primary) hypertension: Secondary | ICD-10-CM

## 2022-09-17 DIAGNOSIS — R739 Hyperglycemia, unspecified: Secondary | ICD-10-CM | POA: Diagnosis not present

## 2022-09-17 DIAGNOSIS — K219 Gastro-esophageal reflux disease without esophagitis: Secondary | ICD-10-CM | POA: Diagnosis not present

## 2022-09-17 DIAGNOSIS — J45909 Unspecified asthma, uncomplicated: Secondary | ICD-10-CM | POA: Insufficient documentation

## 2022-09-17 DIAGNOSIS — Z85038 Personal history of other malignant neoplasm of large intestine: Secondary | ICD-10-CM | POA: Diagnosis not present

## 2022-09-17 DIAGNOSIS — J4520 Mild intermittent asthma, uncomplicated: Secondary | ICD-10-CM

## 2022-09-17 LAB — COMPREHENSIVE METABOLIC PANEL
ALT: 12 U/L (ref 0–35)
AST: 15 U/L (ref 0–37)
Albumin: 4.1 g/dL (ref 3.5–5.2)
Alkaline Phosphatase: 139 U/L — ABNORMAL HIGH (ref 39–117)
BUN: 10 mg/dL (ref 6–23)
CO2: 30 mEq/L (ref 19–32)
Calcium: 9.7 mg/dL (ref 8.4–10.5)
Chloride: 103 mEq/L (ref 96–112)
Creatinine, Ser: 0.79 mg/dL (ref 0.40–1.20)
GFR: 74.28 mL/min (ref >60.00)
Glucose, Bld: 93 mg/dL (ref 70–99)
Potassium: 3.8 mEq/L (ref 3.5–5.1)
Sodium: 140 mEq/L (ref 135–145)
Total Bilirubin: 0.6 mg/dL (ref 0.2–1.2)
Total Protein: 7.4 g/dL (ref 6.0–8.3)

## 2022-09-17 LAB — URINALYSIS, ROUTINE W REFLEX MICROSCOPIC
Bilirubin Urine: NEGATIVE
Hgb urine dipstick: NEGATIVE
Ketones, ur: NEGATIVE
Nitrite: NEGATIVE
RBC / HPF: NONE SEEN (ref 0–?)
Specific Gravity, Urine: 1.01 (ref 1.000–1.030)
Total Protein, Urine: NEGATIVE
Urine Glucose: NEGATIVE
Urobilinogen, UA: 1 (ref 0.0–1.0)
pH: 6 (ref 5.0–8.0)

## 2022-09-17 LAB — CBC WITH DIFFERENTIAL/PLATELET
Basophils Absolute: 0 10*3/uL (ref 0.0–0.1)
Basophils Relative: 0.6 % (ref 0.0–3.0)
Eosinophils Absolute: 0.1 10*3/uL (ref 0.0–0.7)
Eosinophils Relative: 1.9 % (ref 0.0–5.0)
HCT: 38.9 % (ref 36.0–46.0)
Hemoglobin: 12.1 g/dL (ref 12.0–15.0)
Lymphocytes Relative: 26.5 % (ref 12.0–46.0)
Lymphs Abs: 1.3 10*3/uL (ref 0.7–4.0)
MCHC: 31.2 g/dL (ref 30.0–36.0)
MCV: 74.1 fl — ABNORMAL LOW (ref 78.0–100.0)
Monocytes Absolute: 0.7 10*3/uL (ref 0.1–1.0)
Monocytes Relative: 13.2 % — ABNORMAL HIGH (ref 3.0–12.0)
Neutro Abs: 2.9 10*3/uL (ref 1.4–7.7)
Neutrophils Relative %: 57.8 % (ref 43.0–77.0)
Platelets: 237 10*3/uL (ref 150.0–400.0)
RBC: 5.26 Mil/uL — ABNORMAL HIGH (ref 3.87–5.11)
RDW: 16.9 % — ABNORMAL HIGH (ref 11.5–15.5)
WBC: 5.1 10*3/uL (ref 4.0–10.5)

## 2022-09-17 LAB — HEMOGLOBIN A1C: Hgb A1c MFr Bld: 5.9 % (ref 4.6–6.5)

## 2022-09-17 LAB — VITAMIN B12: Vitamin B-12: 380 pg/mL (ref 211–911)

## 2022-09-17 LAB — VITAMIN D 25 HYDROXY (VIT D DEFICIENCY, FRACTURES): VITD: 21.48 ng/mL — ABNORMAL LOW (ref 30.00–100.00)

## 2022-09-17 LAB — LIPID PANEL
Cholesterol: 188 mg/dL (ref 0–200)
HDL: 87.8 mg/dL (ref >39.00)
LDL Cholesterol: 85 mg/dL (ref 0–99)
NonHDL: 100.43
Total CHOL/HDL Ratio: 2
Triglycerides: 75 mg/dL (ref 0.0–149.0)
VLDL: 15 mg/dL (ref 0.0–40.0)

## 2022-09-17 LAB — TSH: TSH: 3.03 u[IU]/mL (ref 0.35–5.50)

## 2022-09-17 NOTE — Progress Notes (Signed)
Subjective:  Patient ID: [name], female    DOB: 1950-05-14  Age: 72 y.o. MRN: [id number]  CC: Follow-up (4 mnth f/u)   HPI [name] presents for asthma, allergies, HTN, GERD, cancer  Outpatient Medications Prior to Visit  Medication Sig Dispense Refill   albuterol (PROVENTIL) (2.5 MG/3ML) 0.083% nebulizer solution Take 3 mLs (2.5 mg total) by nebulization every 4 (four) hours as needed for wheezing or shortness of breath. 225 mL 3   albuterol (VENTOLIN HFA) 108 (90 Base) MCG/ACT inhaler INHALE 2 PUFFS INTO THE LUNGS EVERY 4 HOURS AS NEEDED FOR WHEEZING OR SHORTNESS OF BREATH 36 g 1   aspirin EC 81 MG tablet Take 81 mg by mouth in the morning. Swallow whole.     budesonide-formoterol (SYMBICORT) 80-4.5 MCG/ACT inhaler Inhale 2 puffs into the lungs 2 (two) times daily. 3 each 3   cetirizine (ZYRTEC) 10 MG tablet Take 1 tablet (10 mg total) by mouth daily. 90 tablet 1   Cholecalciferol (VITAMIN D) 1000 UNITS capsule Take 1,000 Units by mouth every morning.     Cyanocobalamin (VITAMIN B-12) 1000 MCG SUBL Place 1 tablet (1,000 mcg total) under the tongue daily. 100 tablet 3   EPINEPHrine 0.3 mg/0.3 mL IJ SOAJ injection Use as directed for severe allergic reaction 2 each 1   famotidine (PEPCID) 40 MG tablet TAKE 1 TABLET EVERY DAY 90 tablet 3   ferrous sulfate 325 (65 FE) MG tablet Take 325 mg by mouth in the morning and at bedtime.     fluticasone (FLONASE) 50 MCG/ACT nasal spray Place 1 spray into both nostrils daily. 1-2 sprays each nostril once daily 48 g 1   furosemide (LASIX) 40 MG tablet TAKE 1 TABLET BY MOUTH DAILY AS  NEEDED FOR EDEMA 100 tablet 2   gabapentin (NEURONTIN) 300 MG capsule Take 1 capsule (300 mg total) by mouth 2 (two) times daily. 180 capsule 2   hydrochlorothiazide (HYDRODIURIL) 25 MG tablet TAKE 1 TABLET EVERY DAY 90 tablet 3   ipratropium (ATROVENT) 0.06 % nasal spray USE 1 SPRAY IN EACH NOSTRIL FOUR TIMES DAILY AS NEEDED FOR ALLERGIES 45 mL 3    irbesartan (AVAPRO) 150 MG tablet TAKE 1 TABLET EVERY DAY 90 tablet 3   montelukast (SINGULAIR) 10 MG tablet TAKE 1 TABLET ONE TIME DAILY WITH BREAKFAST 90 tablet 1   omeprazole (PRILOSEC) 20 MG capsule TAKE 1 CAPSULE EVERY DAY 90 capsule 3   Potassium Chloride ER 20 MEQ TBCR TAKE 1 TABLET EVERY DAY AS NEEDED (TAKE W/ FUROSEMIDE) 90 tablet 3   No facility-administered medications prior to visit.    ROS: Review of Systems  Constitutional:  Negative for activity change, appetite change, chills, fatigue and unexpected weight change.  HENT:  Negative for congestion, mouth sores and sinus pressure.   Eyes:  Negative for visual disturbance.  Respiratory:  Negative for cough and chest tightness.   Gastrointestinal:  Negative for abdominal pain and nausea.  Genitourinary:  Negative for difficulty urinating, frequency and vaginal pain.  Musculoskeletal:  Negative for back pain and gait problem.  Skin:  Negative for pallor and rash.  Neurological:  Negative for dizziness, tremors, weakness, numbness and headaches.  Psychiatric/Behavioral:  Negative for confusion and sleep disturbance.     Objective:  BP 102/64 (BP Location: Left Arm, Patient Position: Sitting, Cuff Size: Large)   Pulse 78   Temp 98.3 F (36.8 C) (Oral)   Ht 5\' 2"  (1.575 m)   Wt 198 lb (89.8  kg)   SpO2 95%   BMI 36.21 kg/m   BP Readings from Last 3 Encounters:  09/17/22 102/64  08/20/22 120/70  08/07/22 130/88    Wt Readings from Last 3 Encounters:  09/17/22 198 lb (89.8 kg)  08/20/22 200 lb (90.7 kg)  08/07/22 202 lb 4 oz (91.7 kg)    Physical Exam Constitutional:      General: She is not in acute distress.    Appearance: She is well-developed. She is obese.  HENT:     Head: Normocephalic.     Right Ear: External ear normal.     Left Ear: External ear normal.     Nose: Nose normal.  Eyes:     General:        Right eye: No discharge.        Left eye: No discharge.     Conjunctiva/sclera: Conjunctivae  normal.     Pupils: Pupils are equal, round, and reactive to light.  Neck:     Thyroid: No thyromegaly.     Vascular: No JVD.     Trachea: No tracheal deviation.  Cardiovascular:     Rate and Rhythm: Normal rate and regular rhythm.     Heart sounds: Normal heart sounds.  Pulmonary:     Effort: No respiratory distress.     Breath sounds: No stridor. No wheezing.  Abdominal:     General: Bowel sounds are normal. There is no distension.     Palpations: Abdomen is soft. There is no mass.     Tenderness: There is no abdominal tenderness. There is no guarding or rebound.  Musculoskeletal:        General: No tenderness.     Cervical back: Normal range of motion and neck supple. No rigidity.  Lymphadenopathy:     Cervical: No cervical adenopathy.  Skin:    Findings: No erythema or rash.  Neurological:     Mental Status: She is oriented to person, place, and time.     Cranial Nerves: No cranial nerve deficit.     Motor: No abnormal muscle tone.     Coordination: Coordination normal.     Gait: Gait abnormal.     Deep Tendon Reflexes: Reflexes normal.  Psychiatric:        Behavior: Behavior normal.        Thought Content: Thought content normal.        Judgment: Judgment normal.   Arthritic gait, stiff Using a cane  Lab Results  Component Value Date   WBC 4.7 02/19/2022   HGB 12.5 02/19/2022   HCT 38.0 02/19/2022   PLT 241.0 02/19/2022   GLUCOSE 97 02/19/2022   CHOL 179 02/19/2022   TRIG 76.0 02/19/2022   HDL 86.10 02/19/2022   LDLCALC 78 02/19/2022   ALT 13 02/19/2022   AST 16 02/19/2022   NA 142 02/19/2022   K 4.1 02/19/2022   CL 105 02/19/2022   CREATININE 0.81 02/19/2022   BUN 17 02/19/2022   CO2 29 02/19/2022   TSH 2.66 02/19/2022   INR 1.07 02/02/2013   HGBA1C 6.1 02/19/2022    CT CHEST ABDOMEN PELVIS W CONTRAST  Result Date: 10/11/2021 CLINICAL DATA:  Restaging of colon cancer. * Tracking Code: BO * EXAM: CT CHEST, ABDOMEN, AND PELVIS WITH CONTRAST  TECHNIQUE: Multidetector CT imaging of the chest, abdomen and pelvis was performed following the standard protocol during bolus administration of intravenous contrast. RADIATION DOSE REDUCTION: This exam was performed according to the departmental dose-optimization program  which includes automated exposure control, adjustment of the mA and/or kV according to patient size and/or use of iterative reconstruction technique. CONTRAST:  85mL OMNIPAQUE IOHEXOL 300 MG/ML  SOLN COMPARISON:  September 11, 2020 FINDINGS: CT CHEST FINDINGS Cardiovascular: Stable appearance of the heart great vessels without aortic dilation. Normal caliber of central pulmonary vessels. Mediastinum/Nodes: No thoracic inlet, axillary, mediastinal or hilar adenopathy. Esophagus grossly normal. Lungs/Pleura: Mild basilar atelectasis. Airways are patent. No signs of pleural effusion or suspicious pulmonary nodule. Musculoskeletal: No chest wall mass. See below for full musculoskeletal details. CT ABDOMEN PELVIS FINDINGS Hepatobiliary: Smooth hepatic contours. No focal, suspicious hepatic lesion. The portal vein is patent. Post cholecystectomy without substantial biliary duct distension. Pancreas: Normal, without mass, inflammation or ductal dilatation. Spleen: Normal. Adrenals/Urinary Tract: Adrenal glands are unremarkable. Symmetric renal enhancement. No sign of hydronephrosis. No suspicious renal lesion or perinephric stranding. Urinary bladder is grossly unremarkable. Stomach/Bowel: Small hiatal hernia. No signs of bowel obstruction or acute bowel process. Evidence of prior partial bowel resection in the LEFT upper quadrant with patulous anastomotic site involving the small bowel is unchanged, no signs of obstruction or inflammation. Small bowel herniation into the RIGHT lower quadrant ostomy site also unchanged and without acute process. Appendix not visualized and no secondary signs to suggest acute appendicitis. Signs of partial colonic resection  with foreshortening of the colon and tortuosity. Colon confined to the midline of the abdomen and RIGHT lower quadrant. No signs of colonic obstruction. No pericolonic stranding. Vascular/Lymphatic: Aorta with smooth contours. IVC with smooth contours. No aneurysmal dilation of the abdominal aorta. There is no gastrohepatic or hepatoduodenal ligament lymphadenopathy. No retroperitoneal or mesenteric lymphadenopathy. No pelvic sidewall lymphadenopathy. Reproductive: Post hysterectomy. Other: RIGHT lower quadrant hernia at the previous ostomy site. No ascites. Musculoskeletal: No acute bone finding. No destructive bone process. Spinal degenerative changes. Degenerative changes of sacroiliac joints similar to previous imaging. Degenerative changes of glenohumeral joints RIGHT greater than LEFT IMPRESSION: 1. No evidence of metastatic disease within the chest, abdomen or pelvis. 2. Signs of prior partial colonic resection, LEFT hemicolectomy. 3. Signs of prior partial small-bowel resection as before. 4. Small hiatal hernia. 5. Post cholecystectomy. 6. RIGHT lower quadrant ostomy site with small bowel herniation into the ostomy site, also unchanged and without acute process. Electronically Signed   By: Donzetta Kohut M.D.   On: 10/11/2021 13:11    Assessment & Plan:   Problem List Items Addressed This Visit     Essential hypertension - Primary    Chronic  Cont on Irbesartan, HCT, Furosemide prn, KCl        Pernicious anemia    On B12      GERD (gastroesophageal reflux disease) (Chronic)    Cont on Omeprazole, Pepcid      History of colon cancer    F/u w/Dr Truett Perna every 6 months      Asthma    On Ventolin prn, Symbicort bid          No orders of the defined types were placed in this encounter.     Follow-up: No follow-ups on file.  Sonda Primes, MD

## 2022-09-17 NOTE — Assessment & Plan Note (Signed)
On B12 

## 2022-09-17 NOTE — Assessment & Plan Note (Signed)
Chronic  Cont on Irbesartan, HCT, Furosemide prn, KCl  . 

## 2022-09-17 NOTE — Assessment & Plan Note (Signed)
F/u w/Dr Truett Perna every 6 months

## 2022-09-17 NOTE — Assessment & Plan Note (Signed)
On Ventolin prn, Symbicort bid

## 2022-09-17 NOTE — Assessment & Plan Note (Signed)
Cont on Omeprazole, Pepcid 

## 2022-09-19 ENCOUNTER — Telehealth: Payer: Self-pay | Admitting: Internal Medicine

## 2022-09-19 NOTE — Telephone Encounter (Signed)
PT had blood work done 8.20.24 and would like for her nurse to give her a call for her results at your earliest convenience.

## 2022-09-20 MED ORDER — VITAMIN D (ERGOCALCIFEROL) 1.25 MG (50000 UNIT) PO CAPS: 50000.0000 [IU] | ORAL_CAPSULE | ORAL | 0 refills | Status: AC

## 2022-09-20 MED ORDER — VITAMIN D3 50 MCG (2000 UT) PO CAPS
2000.0000 [IU] | ORAL_CAPSULE | Freq: Every day | ORAL | 3 refills | Status: DC
Start: 2022-09-20 — End: 2023-07-08

## 2022-09-20 NOTE — Telephone Encounter (Signed)
See MD response on labs.[name]

## 2022-09-20 NOTE — Telephone Encounter (Signed)
Lucy, Please inform the patient that all labs are stable, except for a low Vit D. Start Vit D prescription 50000 iu weekly (Rx emailed to your pharmacy) followed by over-the-counter Vit D 2000 iu daily.  Thanks, AP

## 2022-10-08 DIAGNOSIS — G4733 Obstructive sleep apnea (adult) (pediatric): Secondary | ICD-10-CM | POA: Diagnosis not present

## 2022-10-08 DIAGNOSIS — M79671 Pain in right foot: Secondary | ICD-10-CM | POA: Diagnosis not present

## 2022-10-08 DIAGNOSIS — M19071 Primary osteoarthritis, right ankle and foot: Secondary | ICD-10-CM | POA: Diagnosis not present

## 2022-10-29 ENCOUNTER — Inpatient Hospital Stay (HOSPITAL_BASED_OUTPATIENT_CLINIC_OR_DEPARTMENT_OTHER): Payer: Medicare PPO | Admitting: Oncology

## 2022-10-29 ENCOUNTER — Inpatient Hospital Stay: Payer: Medicare PPO | Attending: Oncology

## 2022-10-29 ENCOUNTER — Inpatient Hospital Stay: Payer: Medicare PPO

## 2022-10-29 ENCOUNTER — Ambulatory Visit (HOSPITAL_BASED_OUTPATIENT_CLINIC_OR_DEPARTMENT_OTHER)
Admission: RE | Admit: 2022-10-29 | Discharge: 2022-10-29 | Disposition: A | Discharge status: Home / Self Care | Payer: Medicare PPO | Source: Ambulatory Visit | Attending: Nurse Practitioner | Admitting: Nurse Practitioner

## 2022-10-29 VITALS — BP 120/65 | HR 69 | Temp 98.1°F | Resp 18 | Ht 62.0 in | Wt 207.0 lb

## 2022-10-29 DIAGNOSIS — C184 Malignant neoplasm of transverse colon: Secondary | ICD-10-CM | POA: Diagnosis not present

## 2022-10-29 DIAGNOSIS — K439 Ventral hernia without obstruction or gangrene: Secondary | ICD-10-CM | POA: Diagnosis not present

## 2022-10-29 DIAGNOSIS — Z23 Encounter for immunization: Secondary | ICD-10-CM

## 2022-10-29 DIAGNOSIS — C189 Malignant neoplasm of colon, unspecified: Secondary | ICD-10-CM | POA: Diagnosis not present

## 2022-10-29 DIAGNOSIS — Z85038 Personal history of other malignant neoplasm of large intestine: Secondary | ICD-10-CM | POA: Insufficient documentation

## 2022-10-29 DIAGNOSIS — Z9221 Personal history of antineoplastic chemotherapy: Secondary | ICD-10-CM | POA: Diagnosis not present

## 2022-10-29 LAB — BASIC METABOLIC PANEL - CANCER CENTER ONLY
Anion gap: 9 (ref 5–15)
BUN: 14 mg/dL (ref 8–23)
CO2: 27 mmol/L (ref 22–32)
Calcium: 9.8 mg/dL (ref 8.9–10.3)
Chloride: 106 mmol/L (ref 98–111)
Creatinine: 0.75 mg/dL (ref 0.44–1.00)
GFR, Estimated: >60 mL/min (ref >60)
Glucose, Bld: 97 mg/dL (ref 70–99)
Potassium: 3.7 mmol/L (ref 3.5–5.1)
Sodium: 142 mmol/L (ref 135–145)

## 2022-10-29 LAB — CEA (ACCESS): CEA (CHCC): 1.72 ng/mL (ref 0.00–5.00)

## 2022-10-29 MED ORDER — INFLUENZA VAC A&B SURF ANT ADJ 0.5 ML IM SUSY
0.5000 mL | PREFILLED_SYRINGE | Freq: Once | INTRAMUSCULAR | Status: AC
  Administered 2022-10-29: 0.5 mL via INTRAMUSCULAR
  Filled 2022-10-29: qty 0.5

## 2022-10-29 MED ORDER — IOHEXOL 300 MG/ML  SOLN
100.0000 mL | Freq: Once | INTRAMUSCULAR | Status: AC | PRN
  Administered 2022-10-29: 100 mL via INTRAVENOUS

## 2022-10-29 NOTE — Progress Notes (Signed)
[facility] OFFICE PROGRESS NOTE   Diagnosis: Colon cancer  INTERVAL HISTORY:   [name] returns as scheduled.  She feels well.  Good appetite.  No difficulty with bowel function.  No complaint.  Objective:  Vital signs in last 24 hours:  Blood pressure 120/65, pulse 69, temperature 98.1 F (36.7 C), temperature source Temporal, resp. rate 18, height 5\' 2"  (1.575 m), weight 207 lb (93.9 kg), SpO2 99%.    Lymphatics: No cervical, supraclavicular, axillary, or inguinal nodes Resp: Lungs clear bilaterally Cardio: Regular rate and rhythm GI: No hepatosplenomegaly, no mass, nontender Vascular: No leg edema   Lab Results:  Lab Results  Component Value Date   WBC 5.1 09/17/2022   HGB 12.1 09/17/2022   HCT 38.9 09/17/2022   MCV 74.1 (L) 09/17/2022   PLT 237.0 09/17/2022   NEUTROABS 2.9 09/17/2022    CMP  Lab Results  Component Value Date   NA 142 10/29/2022   K 3.7 10/29/2022   CL 106 10/29/2022   CO2 27 10/29/2022   GLUCOSE 97 10/29/2022   BUN 14 10/29/2022   CREATININE 0.75 10/29/2022   CALCIUM 9.8 10/29/2022   PROT 7.4 09/17/2022   ALBUMIN 4.1 09/17/2022   AST 15 09/17/2022   ALT 12 09/17/2022   ALKPHOS 139 (H) 09/17/2022   BILITOT 0.6 09/17/2022   GFRNONAA >60 10/29/2022   GFRAA >60 03/09/2018    Lab Results  Component Value Date   CEA1 1.46 09/11/2020   CEA 1.72 10/29/2022     Medications: I have reviewed the patient's current medications.   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   Presentation to the emergency room 02/02/2013 with a colonic obstruction secondary to tumor at the splenic flexure.   Baseline CEA on 02/02/2013 less than 0.5.    Initiation of adjuvant Xeloda 04/10/2013.   Cycle 2 adjuvant Xeloda 05/01/2013.   Cycle 3 adjuvant Xeloda 05/22/2013.   Cycle 4 adjuvant Xeloda 06/12/2013.   Cycle 5 adjuvant Xeloda 07/03/2013 (Xeloda dose reduced due to hand foot syndrome).   Cycle 6 adjuvant Xeloda 07/24/2013.   Cycle 7 adjuvant Xeloda 08/14/2013   Cycle 8 adjuvant Xeloda 09/07/2013.  Surveillance colonoscopy 11/26/2016-patent end to side colocolonic anastomosis, characterized by healthy-appearing mucosa.  Examination otherwise normal.  Repeat colonoscopy in 5 years for surveillance.   History of iron deficiency anemia. Recurrent anemia 02/01/2014, improved, Persistent red cell microcytosis Asthma. Hand-foot syndrome secondary to Xeloda. Status post ostomy reversal 11/25/2013. CT 02/01/2014 with no evidence of local tumor recurrence or metastatic disease. New area of masslike thickening and small bowel dilatation at the mid small bowel Status post deep enteroscopy at [facility] on 03/16/2014, negative. Status post capsule endoscopy 03/22/2014, confirmed a small bowel tumor Exploratory laparotomy with resection of a small bowel mass on 04/06/2014 with the pathology confirming invasive adenocarcinoma extending through small bowel wall and involving adjacent loops of adherent small bowel, 2 of 12 lymph nodes positive. History consistent with recurrent colon cancer. Loss of MLH1 and PMS 2, MSI high as was the January 2015 tumor CT abdomen/pelvis 02/03/2015-no evidence of recurrent colon cancer, ventral hernias CT chest/abdomen/pelvis 03/09/2018-no evidence of recurrent or metastatic disease.  Nonspecific 2 mm posterior right upper lobe nodule.  Continued attention on follow-up exams suggested. CT chest 09/13/2019-no evidence  of metastatic disease CTs 09/11/2020-no evidence of thoracic metastases, no evidence of metastatic disease in the abdomen or pelvis CTs 10/10/2021-no evidence of metastatic disease    Disposition: [name]  remains in clinical remission from colon cancer.  She underwent surveillance imaging earlier today.  We will contact her when the final reading is available.  She will return for an office visit and CEA in 6 months.   Thornton Papas, MD  10/29/2022  12:15 PM

## 2022-10-30 ENCOUNTER — Telehealth: Payer: Self-pay

## 2022-10-30 NOTE — Telephone Encounter (Signed)
-----   Message from Thornton Papas sent at 10/29/2022  7:17 PM EDT ----- Please call patient, CTs are negative for cancer, f/u as scheduled

## 2022-10-30 NOTE — Telephone Encounter (Signed)
Patient gave verbal understanding and had no further questions or concern

## 2022-10-31 ENCOUNTER — Ambulatory Visit: Payer: Medicare PPO | Admitting: Oncology

## 2022-11-01 ENCOUNTER — Other Ambulatory Visit: Payer: Self-pay | Admitting: Internal Medicine

## 2022-11-07 DIAGNOSIS — M79671 Pain in right foot: Secondary | ICD-10-CM | POA: Diagnosis not present

## 2022-11-07 DIAGNOSIS — M19071 Primary osteoarthritis, right ankle and foot: Secondary | ICD-10-CM | POA: Diagnosis not present

## 2022-11-07 DIAGNOSIS — G4733 Obstructive sleep apnea (adult) (pediatric): Secondary | ICD-10-CM | POA: Diagnosis not present

## 2022-12-04 ENCOUNTER — Other Ambulatory Visit: Payer: Self-pay | Admitting: Allergy & Immunology

## 2022-12-08 DIAGNOSIS — M19071 Primary osteoarthritis, right ankle and foot: Secondary | ICD-10-CM | POA: Diagnosis not present

## 2022-12-08 DIAGNOSIS — G4733 Obstructive sleep apnea (adult) (pediatric): Secondary | ICD-10-CM | POA: Diagnosis not present

## 2022-12-08 DIAGNOSIS — M79671 Pain in right foot: Secondary | ICD-10-CM | POA: Diagnosis not present

## 2022-12-12 IMAGING — CT CT CHEST-ABD-PELV W/ CM
2 of 5 series · 15 of 46 positions shown, 17 images · IV contrast (APPLIED)
Comparison: CT 03/09/2018

CLINICAL DATA: Gastrointestinal cancer. Adenocarcinoma transverse
colon. Status post partial colectomy.

EXAM:
CT CHEST, ABDOMEN, AND PELVIS WITH CONTRAST
TECHNIQUE: Multidetector CT imaging of the chest, abdomen and pelvis was
performed following the standard protocol during bolus
administration of intravenous contrast.
CONTRAST:  80mL OMNIPAQUE IOHEXOL 350 MG/ML SOLN

[Series 2: cap with · axial · 0.92mm/px · z∈[+828,+1308]mm · 12 of 114 slices shown, 14 images]
[im 9/114  soft-tissue]
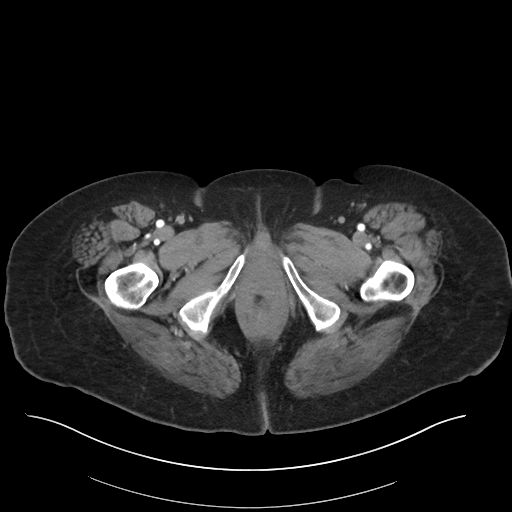
[im 9/114  bone]
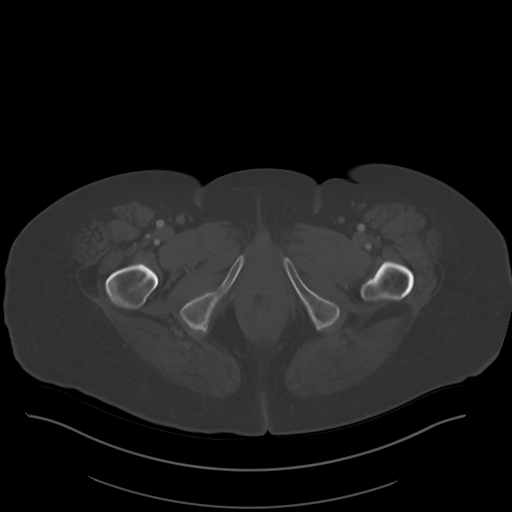
[im 18/114  soft-tissue]
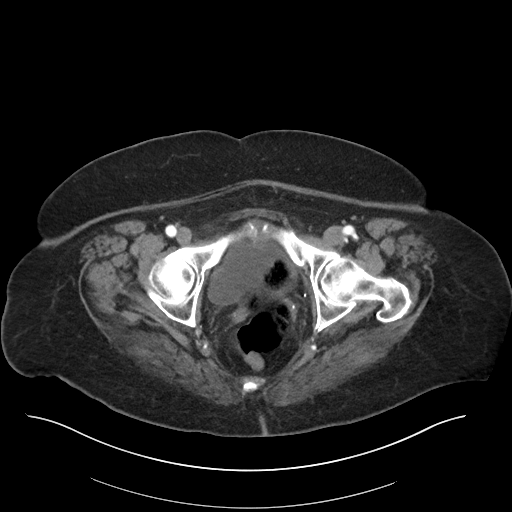
[im 27/114  soft-tissue]
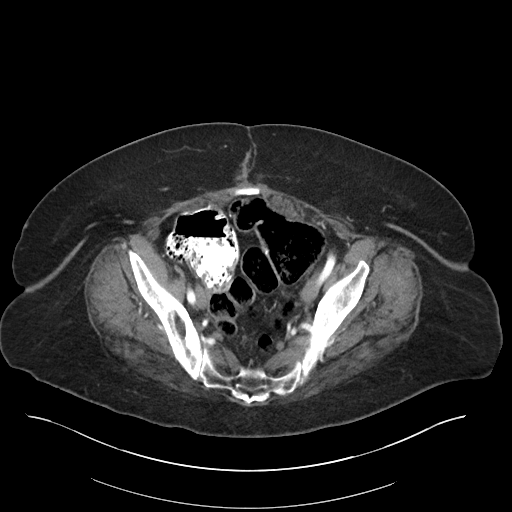
[im 35/114  soft-tissue]
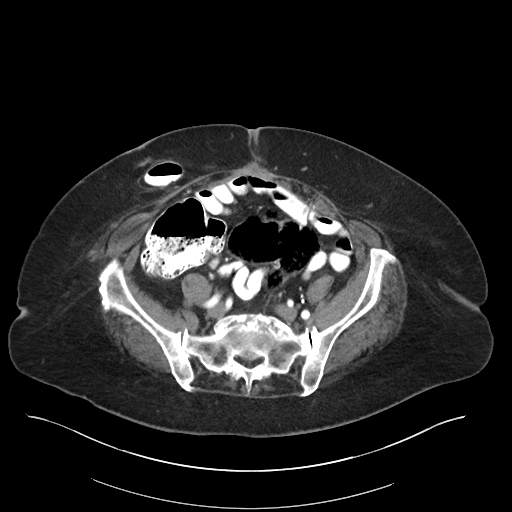
[im 44/114  soft-tissue]
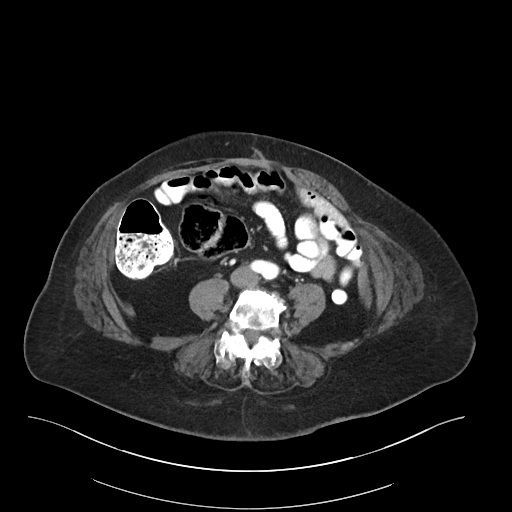
[im 53/114  soft-tissue]
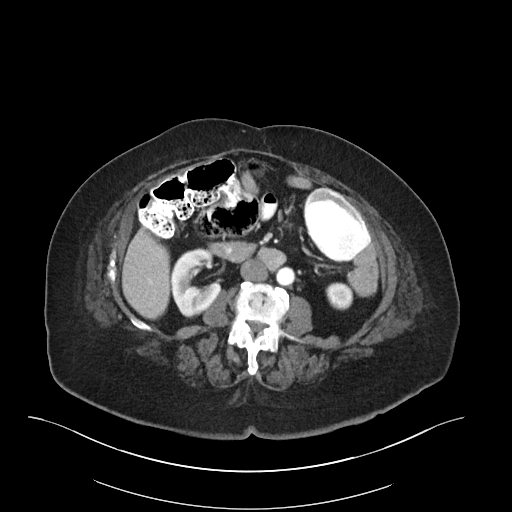
[im 61/114  soft-tissue]
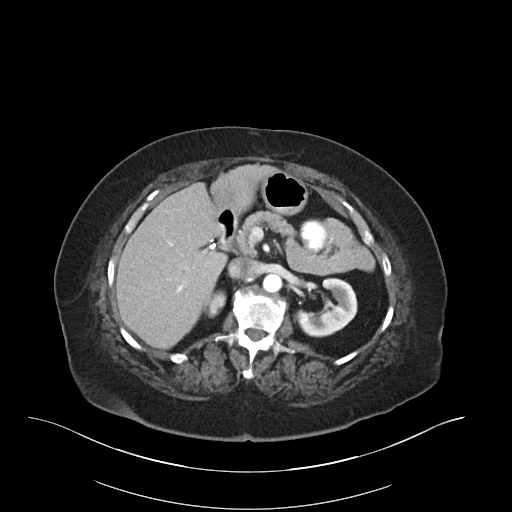
[im 70/114  soft-tissue]
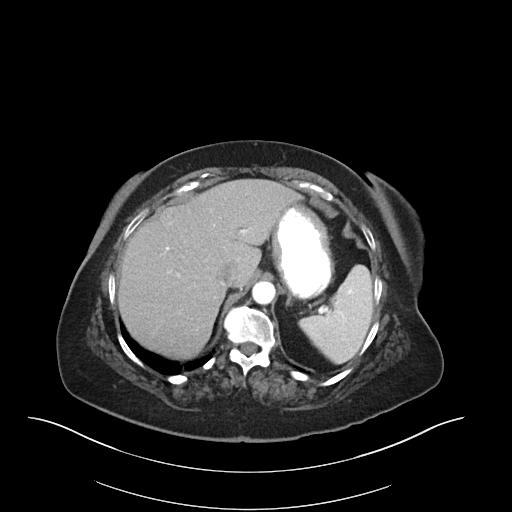
[im 79/114  soft-tissue]
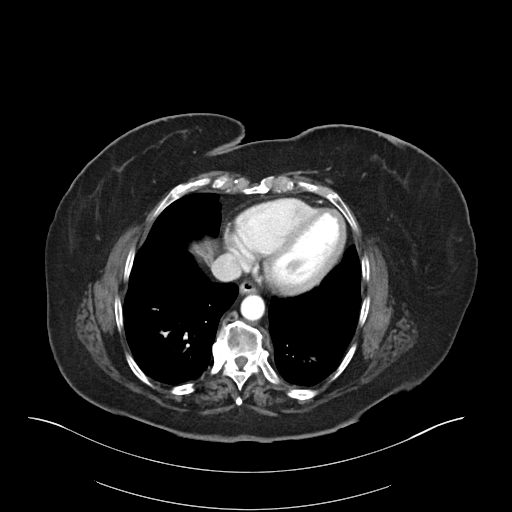
[im 79/114  bone]
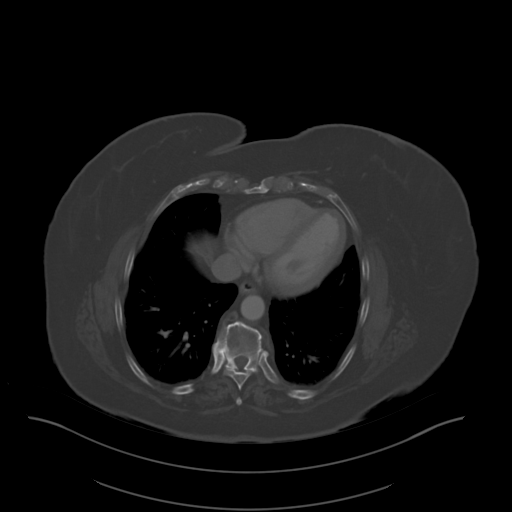
[im 87/114  soft-tissue]
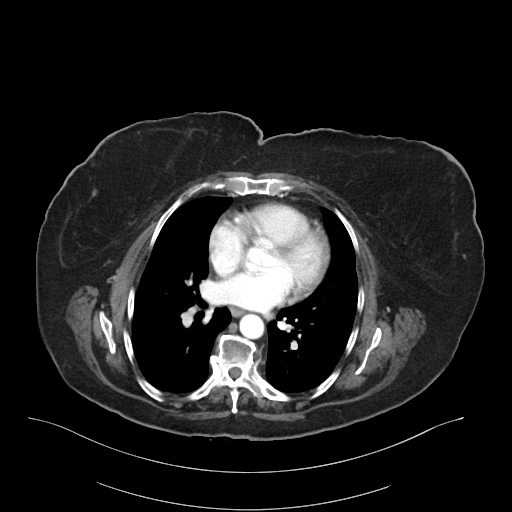
[im 96/114  soft-tissue]
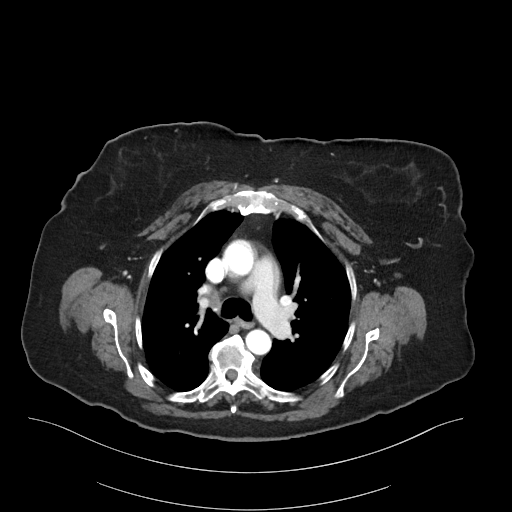
[im 105/114  soft-tissue]
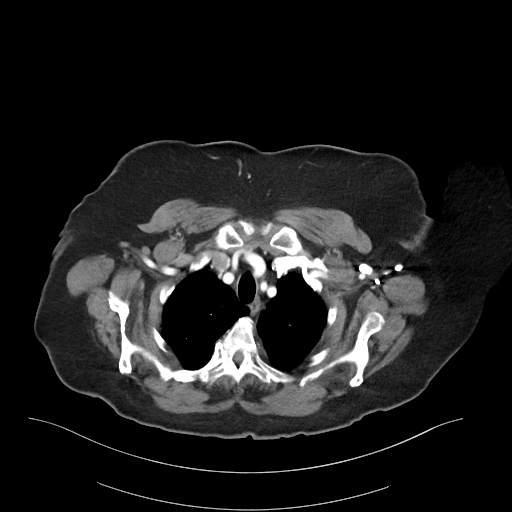

[Series 4: coronal · coronal · 0.87mm/px · 3 of 109 slices shown]
[im 37/109  soft-tissue]
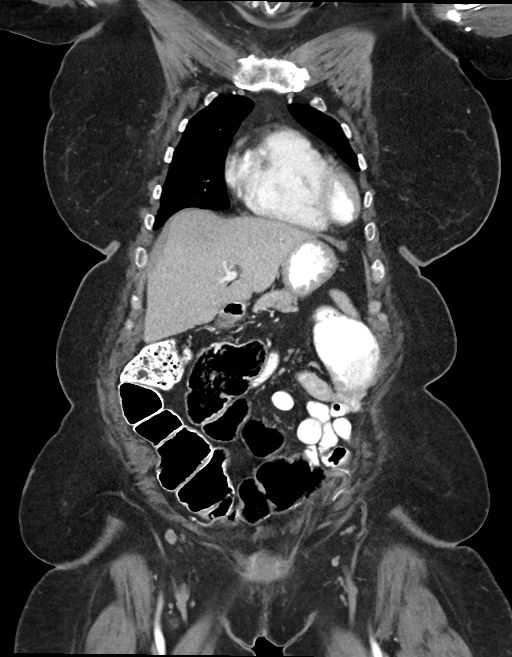
[im 49/109  soft-tissue]
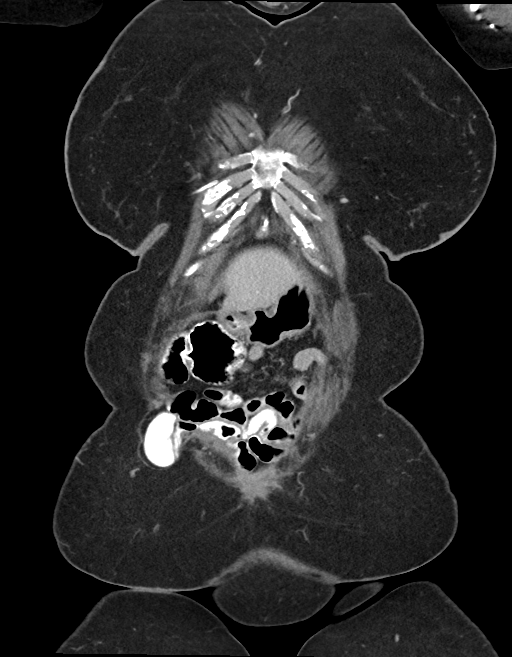
[im 61/109  soft-tissue]
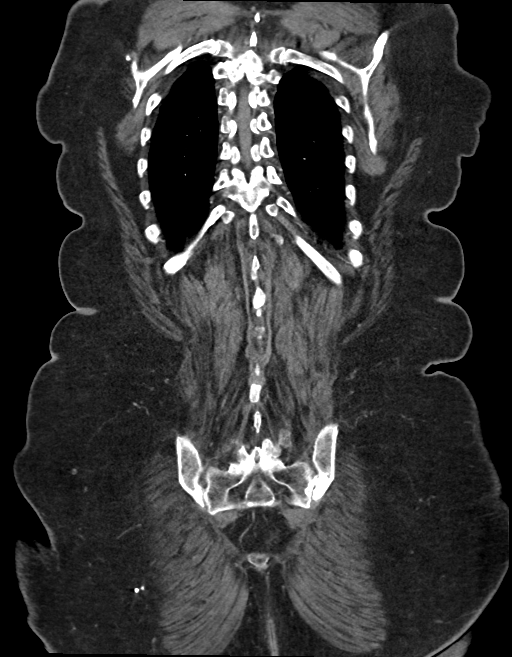

[15 of 46 positions shown; findings below may reference images not displayed]

FINDINGS: CT CHEST FINDINGS

Cardiovascular: No significant vascular findings. Normal heart size.
No pericardial effusion.

Mediastinum/Nodes: No suspicious pulmonary nodules. Normal pleural.
Airways normal.

Lungs/Pleura: No suspicious pulmonary nodules. Normal pleural.
Airways normal.

Musculoskeletal: No aggressive osseous lesion.

CT ABDOMEN AND PELVIS FINDINGS

Hepatobiliary: No focal hepatic lesion. Postcholecystectomy. No
biliary dilatation.

Pancreas: Pancreas is normal. No ductal dilatation. No pancreatic
inflammation.

Spleen: Normal spleen

Adrenals/urinary tract: Adrenal glands and kidneys are normal. The
ureters and bladder normal.

Stomach/Bowel: Stomach, duodenum and small-bowel normal. Terminal
ileum and cecum normal. RIGHT abdominal wall hernia contains a short
segment of nonobstructed small bowel (image 78/2). Ascending colon
is normal. Post partial LEFT hemicolectomy. Distal colon leading up
to the anus is unremarkable. No obstruction or mass.

Vascular/Lymphatic: Abdominal aorta is normal caliber. There is no
retroperitoneal or periportal lymphadenopathy. No pelvic
lymphadenopathy.

Reproductive: Post hysterectomy.  Adnexa unremarkable

Other: No peritoneal nodularity.

Musculoskeletal: No aggressive osseous lesion.
IMPRESSION: Chest Impression:

No evidence of thoracic metastasis

Abdomen / Pelvis Impression:

1. Post partial LEFT colectomy.  No evidence of local recurrence.
2. No evidence of metastatic disease in the abdomen pelvis.
3. No skeletal metastasis.
4. RIGHT abdominal wall hernia contains loop of nonobstructed small
bowel

## 2022-12-18 ENCOUNTER — Encounter: Payer: Self-pay | Admitting: Allergy & Immunology

## 2022-12-18 ENCOUNTER — Other Ambulatory Visit: Payer: Self-pay

## 2022-12-18 ENCOUNTER — Ambulatory Visit (INDEPENDENT_AMBULATORY_CARE_PROVIDER_SITE_OTHER): Payer: Medicare PPO | Admitting: Allergy & Immunology

## 2022-12-18 VITALS — BP 140/72 | HR 97 | Temp 98.5°F | Wt 197.1 lb

## 2022-12-18 DIAGNOSIS — J4540 Moderate persistent asthma, uncomplicated: Secondary | ICD-10-CM

## 2022-12-18 DIAGNOSIS — J3089 Other allergic rhinitis: Secondary | ICD-10-CM

## 2022-12-18 DIAGNOSIS — J302 Other seasonal allergic rhinitis: Secondary | ICD-10-CM

## 2022-12-18 DIAGNOSIS — K219 Gastro-esophageal reflux disease without esophagitis: Secondary | ICD-10-CM | POA: Diagnosis not present

## 2022-12-18 MED ORDER — CETIRIZINE HCL 10 MG PO TABS: 10.0000 mg | ORAL_TABLET | Freq: Every day | ORAL | 3 refills | Status: DC

## 2022-12-18 MED ORDER — MONTELUKAST SODIUM 10 MG PO TABS: ORAL_TABLET | ORAL | 3 refills | Status: DC

## 2022-12-18 MED ORDER — ALBUTEROL SULFATE HFA 108 (90 BASE) MCG/ACT IN AERS: 2.0000 | INHALATION_SPRAY | RESPIRATORY_TRACT | 1 refills | Status: AC | PRN

## 2022-12-18 MED ORDER — OMEPRAZOLE 20 MG PO CPDR: 20.0000 mg | DELAYED_RELEASE_CAPSULE | Freq: Every day | ORAL | 3 refills | Status: DC

## 2022-12-18 MED ORDER — IPRATROPIUM BROMIDE 0.06 % NA SOLN: 1.0000 | Freq: Three times a day (TID) | NASAL | 3 refills | Status: AC | PRN

## 2022-12-18 MED ORDER — FLUTICASONE PROPIONATE 50 MCG/ACT NA SUSP: 1.0000 | Freq: Every day | NASAL | 1 refills | Status: AC

## 2022-12-18 MED ORDER — BUDESONIDE-FORMOTEROL FUMARATE 80-4.5 MCG/ACT IN AERO: 2.0000 | INHALATION_SPRAY | Freq: Two times a day (BID) | RESPIRATORY_TRACT | 3 refills | Status: AC

## 2022-12-18 MED ORDER — FAMOTIDINE 40 MG PO TABS: 40.0000 mg | ORAL_TABLET | Freq: Every day | ORAL | 3 refills | Status: DC
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
## 2022-12-18 NOTE — Patient Instructions (Addendum)
1. Seasonal and perennial allergic rhinitis (trees, weeds, grasses, molds, dust mites, cat, dog and cockroach) - Continue to hold allergy shots as we have been doing. - The big test is going to be how good you are doing next spring!  - Continue with fluticasone and ipratropium one spray per nostril daily.  - Continue with cetirizine 10mg  tablet once daily - Continue saline nasal rinses. - Call us if your nasal symptoms worsen.   2. Moderate persistent asthma, uncomplicated - Lung testing looks fantastic!  - I am glad that you are doing so well.  - Daily controller medication(s): Symbicort 80/4.45mcg two puffs twice daily with spacer + montelukast 10 mg once a day - Prior to physical activity: albuterol 2 puffs 10-15 minutes before physical activity. - Rescue medications: albuterol 4 puffs every 4-6 hours as needed - Asthma control goals:  * Full participation in all desired activities (may need albuterol before activity) * Albuterol use two time or less a week on average (not counting use with activity) * Cough interfering with sleep two time or less a month * Oral steroids no more than once a year * No hospitalization  3. GERD - Continue avoidance of caffeine, chocolate, and peppermint intake - Continue with omeprazole in the morning.  4. Return in about 6 months (around 06/17/2023). You can have the follow up appointment with Dr. Dellis Anes or a Nurse Practicioner (our Nurse Practitioners are excellent and always have Physician oversight!).    Please inform us of any Emergency Department visits, hospitalizations, or changes in symptoms. Call us before going to the ED for breathing or allergy symptoms since we might be able to fit you in for a sick visit. Feel free to contact us anytime with any questions, problems, or concerns.  It was a pleasure to see you again today! YOU ARE AN ABSOLUTE JOY!!   Websites that have reliable patient information: 1. American Academy of Asthma, Allergy,  and Immunology: www.aaaai.org 2. Food Allergy Research and Education (FARE): foodallergy.org 3. Mothers of Asthmatics: http://www.asthmacommunitynetwork.org 4. American College of Allergy, Asthma, and Immunology: www.acaai.org      "Like" [place] on Facebook and Instagram for our latest updates!      A healthy democracy works best when [employer] participate! Make sure you are registered to vote! If you have moved or changed any of your contact information, you will need to get this updated before voting! Scan the QR codes below to learn more!

## 2022-12-20 NOTE — Addendum Note (Signed)
Addended by: Elsworth Soho on: 12/20/2022 04:11 PM   Modules accepted: Orders

## 2023-01-07 DIAGNOSIS — G4733 Obstructive sleep apnea (adult) (pediatric): Secondary | ICD-10-CM | POA: Diagnosis not present

## 2023-01-07 DIAGNOSIS — M79671 Pain in right foot: Secondary | ICD-10-CM | POA: Diagnosis not present

## 2023-01-07 DIAGNOSIS — M19071 Primary osteoarthritis, right ankle and foot: Secondary | ICD-10-CM | POA: Diagnosis not present

## 2023-01-15 ENCOUNTER — Encounter: Payer: Self-pay | Admitting: Internal Medicine

## 2023-01-15 ENCOUNTER — Ambulatory Visit: Payer: Medicare PPO | Admitting: Internal Medicine

## 2023-01-15 VITALS — BP 122/74 | HR 70 | Temp 98.4°F | Ht 62.0 in | Wt 202.0 lb

## 2023-01-15 DIAGNOSIS — E66813 Obesity, class 3: Secondary | ICD-10-CM | POA: Diagnosis not present

## 2023-01-15 DIAGNOSIS — E559 Vitamin D deficiency, unspecified: Secondary | ICD-10-CM | POA: Diagnosis not present

## 2023-01-15 DIAGNOSIS — D510 Vitamin B12 deficiency anemia due to intrinsic factor deficiency: Secondary | ICD-10-CM | POA: Diagnosis not present

## 2023-01-15 DIAGNOSIS — Z6841 Body Mass Index (BMI) 40.0 and over, adult: Secondary | ICD-10-CM

## 2023-01-15 DIAGNOSIS — K219 Gastro-esophageal reflux disease without esophagitis: Secondary | ICD-10-CM | POA: Diagnosis not present

## 2023-01-15 DIAGNOSIS — I10 Essential (primary) hypertension: Secondary | ICD-10-CM

## 2023-01-15 LAB — CBC WITH DIFFERENTIAL/PLATELET
Basophils Absolute: 0 10*3/uL (ref 0.0–0.1)
Basophils Relative: 0.8 % (ref 0.0–3.0)
Eosinophils Absolute: 0.1 10*3/uL (ref 0.0–0.7)
Eosinophils Relative: 1.8 % (ref 0.0–5.0)
HCT: 38 % (ref 36.0–46.0)
Hemoglobin: 12.2 g/dL (ref 12.0–15.0)
Lymphocytes Relative: 27.9 % (ref 12.0–46.0)
Lymphs Abs: 1.3 10*3/uL (ref 0.7–4.0)
MCHC: 32.1 g/dL (ref 30.0–36.0)
MCV: 73.5 fL — ABNORMAL LOW (ref 78.0–100.0)
Monocytes Absolute: 0.5 10*3/uL (ref 0.1–1.0)
Monocytes Relative: 10.6 % (ref 3.0–12.0)
Neutro Abs: 2.7 10*3/uL (ref 1.4–7.7)
Neutrophils Relative %: 58.9 % (ref 43.0–77.0)
Platelets: 247 10*3/uL (ref 150.0–400.0)
RBC: 5.18 Mil/uL — ABNORMAL HIGH (ref 3.87–5.11)
RDW: 16.9 % — ABNORMAL HIGH (ref 11.5–15.5)
WBC: 4.7 10*3/uL (ref 4.0–10.5)

## 2023-01-15 LAB — COMPREHENSIVE METABOLIC PANEL
ALT: 10 U/L (ref 0–35)
AST: 14 U/L (ref 0–37)
Albumin: 4 g/dL (ref 3.5–5.2)
Alkaline Phosphatase: 140 U/L — ABNORMAL HIGH (ref 39–117)
BUN: 20 mg/dL (ref 6–23)
CO2: 26 meq/L (ref 19–32)
Calcium: 9.4 mg/dL (ref 8.4–10.5)
Chloride: 107 meq/L (ref 96–112)
Creatinine, Ser: 0.79 mg/dL (ref 0.40–1.20)
GFR: 74.11 mL/min (ref >60.00)
Glucose, Bld: 97 mg/dL (ref 70–99)
Potassium: 3.7 meq/L (ref 3.5–5.1)
Sodium: 141 meq/L (ref 135–145)
Total Bilirubin: 0.6 mg/dL (ref 0.2–1.2)
Total Protein: 7.2 g/dL (ref 6.0–8.3)

## 2023-01-15 LAB — VITAMIN D 25 HYDROXY (VIT D DEFICIENCY, FRACTURES): VITD: 31.89 ng/mL (ref 30.00–100.00)

## 2023-01-15 NOTE — Assessment & Plan Note (Signed)
Cont on Omeprazole, Pepcid 

## 2023-01-15 NOTE — Progress Notes (Signed)
Subjective:  Patient ID: [name], female    DOB: [birth year]  Age: 72 y.o. MRN: [id number]  CC: Medical Management of Chronic Issues (4 month follow up )   HPI [name] presents for GERD, cancer, allergies  Outpatient Medications Prior to Visit  Medication Sig Dispense Refill   albuterol (VENTOLIN HFA) 108 (90 Base) MCG/ACT inhaler Inhale 2 puffs into the lungs every 4 (four) hours as needed for wheezing or shortness of breath. 36 g 1   aspirin EC 81 MG tablet Take 81 mg by mouth in the morning. Swallow whole.     budesonide-formoterol (SYMBICORT) 80-4.5 MCG/ACT inhaler Inhale 2 puffs into the lungs 2 (two) times daily. 3 each 3   cetirizine (ZYRTEC) 10 MG tablet Take 1 tablet (10 mg total) by mouth daily. 90 tablet 3   Cholecalciferol (VITAMIN D3) 50 MCG (2000 UT) capsule Take 1 capsule (2,000 Units total) by mouth daily. 100 capsule 3   Cyanocobalamin (VITAMIN B-12) 1000 MCG SUBL Place 1 tablet (1,000 mcg total) under the tongue daily. 100 tablet 3   famotidine (PEPCID) 40 MG tablet Take 1 tablet (40 mg total) by mouth daily. 90 tablet 3   ferrous sulfate 325 (65 FE) MG tablet Take 325 mg by mouth in the morning and at bedtime.     fluticasone (FLONASE) 50 MCG/ACT nasal spray Place 1 spray into both nostrils daily. 1-2 sprays each nostril once daily 48 g 1   furosemide (LASIX) 40 MG tablet TAKE 1 TABLET BY MOUTH DAILY AS  NEEDED FOR EDEMA 100 tablet 2   gabapentin (NEURONTIN) 300 MG capsule Take 1 capsule (300 mg total) by mouth 2 (two) times daily. 180 capsule 2   hydrochlorothiazide (HYDRODIURIL) 25 MG tablet TAKE 1 TABLET EVERY DAY 90 tablet 3   ipratropium (ATROVENT) 0.06 % nasal spray Place 1 spray into both nostrils 3 (three) times daily as needed for rhinitis. 45 mL 3   irbesartan (AVAPRO) 150 MG tablet TAKE 1 TABLET EVERY DAY 90 tablet 3   montelukast (SINGULAIR) 10 MG tablet TAKE 1 TABLET ONE TIME DAILY WITH BREAKFAST 90 tablet 3   omeprazole (PRILOSEC) 20  MG capsule Take 1 capsule (20 mg total) by mouth daily. 90 capsule 3   Potassium Chloride ER 20 MEQ TBCR TAKE 1 TABLET EVERY DAY AS NEEDED (TAKE W/ FUROSEMIDE) 90 tablet 3   Vitamin D, Ergocalciferol, (DRISDOL) 1.25 MG (50000 UNIT) CAPS capsule Take 1 capsule (50,000 Units total) by mouth every 7 (seven) days. 8 capsule 0   No facility-administered medications prior to visit.    ROS: Review of Systems  Constitutional:  Negative for activity change, appetite change, chills, fatigue and unexpected weight change.  HENT:  Negative for congestion, mouth sores and sinus pressure.   Eyes:  Negative for visual disturbance.  Respiratory:  Negative for cough and chest tightness.   Gastrointestinal:  Negative for abdominal pain and nausea.  Genitourinary:  Negative for difficulty urinating, frequency and vaginal pain.  Musculoskeletal:  Negative for back pain and gait problem.  Skin:  Negative for pallor and rash.  Neurological:  Negative for dizziness, tremors, weakness, numbness and headaches.  Psychiatric/Behavioral:  Negative for confusion, sleep disturbance and suicidal ideas.     Objective:  BP 122/74 (BP Location: Left Arm, Patient Position: Sitting, Cuff Size: Normal)   Pulse 70   Temp 98.4 F (36.9 C) (Oral)   Ht 5\' 2"  (1.575 m)   Wt 202 lb (91.6 kg)  SpO2 98%   BMI 36.95 kg/m   BP Readings from Last 3 Encounters:  01/15/23 122/74  12/18/22 (!) 140/72  10/29/22 120/65    Wt Readings from Last 3 Encounters:  01/15/23 202 lb (91.6 kg)  12/18/22 197 lb 2 oz (89.4 kg)  10/29/22 207 lb (93.9 kg)    Physical Exam Constitutional:      General: She is not in acute distress.    Appearance: She is well-developed. She is obese.  HENT:     Head: Normocephalic.     Right Ear: External ear normal.     Left Ear: External ear normal.     Nose: Nose normal.  Eyes:     General:        Right eye: No discharge.        Left eye: No discharge.     Conjunctiva/sclera: Conjunctivae  normal.     Pupils: Pupils are equal, round, and reactive to light.  Neck:     Thyroid: No thyromegaly.     Vascular: No JVD.     Trachea: No tracheal deviation.  Cardiovascular:     Rate and Rhythm: Normal rate and regular rhythm.     Heart sounds: Normal heart sounds.  Pulmonary:     Effort: No respiratory distress.     Breath sounds: No stridor. No wheezing.  Abdominal:     General: Bowel sounds are normal. There is no distension.     Palpations: Abdomen is soft. There is no mass.     Tenderness: There is no abdominal tenderness. There is no guarding or rebound.  Musculoskeletal:        General: No tenderness.     Cervical back: Normal range of motion and neck supple. No rigidity.  Lymphadenopathy:     Cervical: No cervical adenopathy.  Skin:    Findings: No erythema or rash.  Neurological:     Cranial Nerves: No cranial nerve deficit.     Motor: No abnormal muscle tone.     Coordination: Coordination normal.     Deep Tendon Reflexes: Reflexes normal.  Psychiatric:        Behavior: Behavior normal.        Thought Content: Thought content normal.        Judgment: Judgment normal.     Lab Results  Component Value Date   WBC 5.1 09/17/2022   HGB 12.1 09/17/2022   HCT 38.9 09/17/2022   PLT 237.0 09/17/2022   GLUCOSE 97 10/29/2022   CHOL 188 09/17/2022   TRIG 75.0 09/17/2022   HDL 87.80 09/17/2022   LDLCALC 85 09/17/2022   ALT 12 09/17/2022   AST 15 09/17/2022   NA 142 10/29/2022   K 3.7 10/29/2022   CL 106 10/29/2022   CREATININE 0.75 10/29/2022   BUN 14 10/29/2022   CO2 27 10/29/2022   TSH 3.03 09/17/2022   INR 1.07 02/02/2013   HGBA1C 5.9 09/17/2022    CT CHEST ABDOMEN PELVIS W CONTRAST Result Date: 10/29/2022 CLINICAL DATA:  Follow-up colon carcinoma. Surveillance. * Tracking Code: BO * EXAM: CT CHEST, ABDOMEN, AND PELVIS WITH CONTRAST TECHNIQUE: Multidetector CT imaging of the chest, abdomen and pelvis was performed following the standard protocol during  bolus administration of intravenous contrast. RADIATION DOSE REDUCTION: This exam was performed according to the departmental dose-optimization program which includes automated exposure control, adjustment of the mA and/or kV according to patient size and/or use of iterative reconstruction technique. CONTRAST:  OMNIPAQUE IOHEXOL 300 MG/ML  SOLN COMPARISON:  10/10/2021 FINDINGS: CT CHEST FINDINGS Cardiovascular: No acute findings. Mediastinum/Lymph Nodes: No masses or pathologically enlarged lymph nodes identified. Lungs/Pleura: No suspicious pulmonary nodules or masses identified. No evidence of infiltrate or pleural effusion. Musculoskeletal:  No suspicious bone lesions identified. CT ABDOMEN AND PELVIS FINDINGS Hepatobiliary: No masses identified. Prior cholecystectomy. No evidence of biliary obstruction. Pancreas:  No mass or inflammatory changes. Spleen:  Within normal limits in size and appearance. Adrenals/Urinary tract: No suspicious masses or hydronephrosis. Stomach/Bowel: Stable postop changes from previous left hemicolectomy. No mass identified. No evidence of obstruction, inflammatory process, or abnormal fluid collections. A small right lower quadrant ventral hernia is seen which contains a loop of small bowel. No evidence of bowel obstruction or strangulation. Vascular/Lymphatic: No pathologically enlarged lymph nodes identified. No acute vascular findings. Reproductive: Prior hysterectomy noted. Adnexal regions are unremarkable in appearance. Other:  None. Musculoskeletal:  No suspicious bone lesions identified. IMPRESSION: Stable exam. No evidence of recurrent or metastatic carcinoma within the chest, abdomen, or pelvis. Small right lower quadrant ventral hernia containing a loop of small bowel. No evidence of bowel obstruction or strangulation. Electronically Signed   By: Danae Orleans M.D.   On: 10/29/2022 12:25    Assessment & Plan:   Problem List Items Addressed This Visit     GERD  (gastroesophageal reflux disease) (Chronic)   Cont on Omeprazole, Pepcid      Obesity   Wt Readings from Last 3 Encounters:  01/15/23 202 lb (91.6 kg)  12/18/22 197 lb 2 oz (89.4 kg)  10/29/22 207 lb (93.9 kg)         Essential hypertension - Primary   Chronic  Cont on Irbesartan, HCT, Furosemide prn, KCl        Relevant Orders   VITAMIN D 25 Hydroxy (Vit-D Deficiency, Fractures)   Comprehensive metabolic panel   CBC with Differential/Platelet   Pernicious anemia   On B12      Relevant Orders   VITAMIN D 25 Hydroxy (Vit-D Deficiency, Fractures)   Comprehensive metabolic panel   CBC with Differential/Platelet   Vitamin D deficiency   On Vit D Check labs      Relevant Orders   VITAMIN D 25 Hydroxy (Vit-D Deficiency, Fractures)      No orders of the defined types were placed in this encounter.     Follow-up: Return in about 3 months (around 04/15/2023) for a follow-up visit.  Sonda Primes, MD

## 2023-01-15 NOTE — Assessment & Plan Note (Signed)
On Vit D Check labs

## 2023-01-15 NOTE — Assessment & Plan Note (Signed)
On B12 

## 2023-01-15 NOTE — Assessment & Plan Note (Signed)
Wt Readings from Last 3 Encounters:  01/15/23 202 lb (91.6 kg)  12/18/22 197 lb 2 oz (89.4 kg)  10/29/22 207 lb (93.9 kg)

## 2023-01-15 NOTE — Assessment & Plan Note (Signed)
Chronic  Cont on Irbesartan, HCT, Furosemide prn, KCl  . 

## 2023-04-07 ENCOUNTER — Ambulatory Visit (INDEPENDENT_AMBULATORY_CARE_PROVIDER_SITE_OTHER): Payer: Medicare PPO

## 2023-04-07 VITALS — Ht 62.0 in | Wt 202.0 lb

## 2023-04-07 DIAGNOSIS — Z0000 Encounter for general adult medical examination without abnormal findings: Secondary | ICD-10-CM | POA: Diagnosis not present

## 2023-04-07 NOTE — Patient Instructions (Addendum)
 Ms. Gorton , Thank you for taking time to come for your Medicare Wellness Visit. I appreciate your ongoing commitment to your health goals. Please review the following plan we discussed and let me know if I can assist you in the future.   Referrals/Orders/Follow-Ups/Clinician Recommendations: Aim for 30 minutes of exercise or brisk walking, 6-8 glasses of water, and 5 servings of fruits and vegetables each day. Educated and advised on getting the Tdap and Hepatitis C vaccines in 2025 at local pharmacy.  This is a list of the screening recommended for you and due dates:  Health Maintenance  Topic Date Due   Hepatitis C Screening  Never done   DTaP/Tdap/Td vaccine (2 - Tdap) 08/26/2021   Mammogram  05/21/2023*   Medicare Annual Wellness Visit  04/06/2024   Colon Cancer Screening  07/01/2025   Pneumonia Vaccine  Completed   Flu Shot  Completed   DEXA scan (bone density measurement)  Completed   HPV Vaccine  Aged Out   COVID-19 Vaccine  Discontinued   Zoster (Shingles) Vaccine  Discontinued  *Topic was postponed. The date shown is not the original due date.    Advanced directives: (Declined) Advance directive discussed with you today. Even though you declined this today, please call our office should you change your mind, and we can give you the proper paperwork for you to fill out.  Next Medicare Annual Wellness Visit scheduled for next year: Yes
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
## 2023-04-15 ENCOUNTER — Ambulatory Visit (INDEPENDENT_AMBULATORY_CARE_PROVIDER_SITE_OTHER): Payer: Medicare PPO | Admitting: Internal Medicine

## 2023-04-15 ENCOUNTER — Encounter: Payer: Self-pay | Admitting: Internal Medicine

## 2023-04-15 VITALS — BP 120/70 | HR 81 | Temp 98.0°F | Ht 62.0 in | Wt 203.0 lb

## 2023-04-15 DIAGNOSIS — D510 Vitamin B12 deficiency anemia due to intrinsic factor deficiency: Secondary | ICD-10-CM | POA: Diagnosis not present

## 2023-04-15 DIAGNOSIS — I10 Essential (primary) hypertension: Secondary | ICD-10-CM

## 2023-04-15 DIAGNOSIS — G4733 Obstructive sleep apnea (adult) (pediatric): Secondary | ICD-10-CM

## 2023-04-15 DIAGNOSIS — Z6841 Body Mass Index (BMI) 40.0 and over, adult: Secondary | ICD-10-CM

## 2023-04-15 DIAGNOSIS — E66813 Obesity, class 3: Secondary | ICD-10-CM

## 2023-04-15 MED ORDER — IRBESARTAN 150 MG PO TABS: 150.0000 mg | ORAL_TABLET | Freq: Every day | ORAL | 3 refills | Status: DC

## 2023-04-15 MED ORDER — HYDROCHLOROTHIAZIDE 25 MG PO TABS: 25.0000 mg | ORAL_TABLET | Freq: Every day | ORAL | 3 refills | Status: DC

## 2023-04-15 MED ORDER — IRBESARTAN 150 MG PO TABS: 150.0000 mg | ORAL_TABLET | Freq: Every day | ORAL | 3 refills | Status: AC

## 2023-04-15 MED ORDER — POTASSIUM CHLORIDE ER 20 MEQ PO TBCR: 20.0000 meq | EXTENDED_RELEASE_TABLET | Freq: Every day | ORAL | 3 refills | Status: DC

## 2023-04-15 MED ORDER — HYDROCHLOROTHIAZIDE 25 MG PO TABS: 25.0000 mg | ORAL_TABLET | Freq: Every day | ORAL | 3 refills | Status: AC

## 2023-04-15 NOTE — Progress Notes (Signed)
 Subjective:  Patient ID: [name], female    DOB: Jan 30, 1950  Age: 73 y.o. MRN: [id number]  CC: Medical Management of Chronic Issues (3 mnth f/u)   HPI [name] presents for HTN, OA, cancer f/u  Outpatient Medications Prior to Visit  Medication Sig Dispense Refill   albuterol  (VENTOLIN  HFA) 108 (90 Base) MCG/ACT inhaler Inhale 2 puffs into the lungs every 4 (four) hours as needed for wheezing or shortness of breath. 36 g 1   aspirin  EC 81 MG tablet Take 81 mg by mouth in the morning. Swallow whole.     Cholecalciferol (VITAMIN D3) 50 MCG (2000 UT) capsule Take 1 capsule (2,000 Units total) by mouth daily. 100 capsule 3   Cyanocobalamin  (VITAMIN B-12) 1000 MCG SUBL Place 1 tablet (1,000 mcg total) under the tongue daily. 100 tablet 3   famotidine  (PEPCID ) 40 MG tablet Take 1 tablet (40 mg total) by mouth daily. 90 tablet 3   ferrous sulfate  325 (65 FE) MG tablet Take 325 mg by mouth in the morning and at bedtime.     fluticasone  (FLONASE ) 50 MCG/ACT nasal spray Place 1 spray into both nostrils daily. 1-2 sprays each nostril once daily 48 g 1   furosemide  (LASIX ) 40 MG tablet TAKE 1 TABLET BY MOUTH DAILY AS  NEEDED FOR EDEMA 100 tablet 2   gabapentin  (NEURONTIN ) 300 MG capsule Take 1 capsule (300 mg total) by mouth 2 (two) times daily. 180 capsule 2   ipratropium (ATROVENT ) 0.06 % nasal spray Place 1 spray into both nostrils 3 (three) times daily as needed for rhinitis. 45 mL 3   montelukast  (SINGULAIR ) 10 MG tablet TAKE 1 TABLET ONE TIME DAILY WITH BREAKFAST 90 tablet 3   omeprazole  (PRILOSEC) 20 MG capsule Take 1 capsule (20 mg total) by mouth daily. 90 capsule 3   Vitamin D , Ergocalciferol , (DRISDOL ) 1.25 MG (50000 UNIT) CAPS capsule Take 1 capsule (50,000 Units total) by mouth every 7 (seven) days. (Patient not taking: Reported on 04/29/2023) 8 capsule 0   cetirizine  (ZYRTEC ) 10 MG tablet Take 1 tablet (10 mg total) by mouth daily. 90 tablet 3   hydrochlorothiazide   (HYDRODIURIL ) 25 MG tablet TAKE 1 TABLET EVERY DAY 90 tablet 3   irbesartan  (AVAPRO ) 150 MG tablet TAKE 1 TABLET EVERY DAY 90 tablet 3   Potassium Chloride  ER 20 MEQ TBCR TAKE 1 TABLET EVERY DAY AS NEEDED (TAKE W/ FUROSEMIDE ) 90 tablet 3   budesonide -formoterol  (SYMBICORT ) 80-4.5 MCG/ACT inhaler Inhale 2 puffs into the lungs 2 (two) times daily. 3 each 3   No facility-administered medications prior to visit.    ROS: Review of Systems  Constitutional:  Positive for fatigue. Negative for activity change, appetite change, chills and unexpected weight change.  HENT:  Negative for congestion, mouth sores and sinus pressure.   Eyes:  Negative for visual disturbance.  Respiratory:  Negative for cough and chest tightness.   Gastrointestinal:  Negative for abdominal pain and nausea.  Genitourinary:  Negative for difficulty urinating, frequency and vaginal pain.  Musculoskeletal:  Positive for arthralgias. Negative for back pain and gait problem.  Skin:  Negative for pallor and rash.  Neurological:  Negative for dizziness, tremors, weakness, numbness and headaches.  Psychiatric/Behavioral:  Negative for confusion and sleep disturbance.     Objective:  BP 120/70   Pulse 81   Temp 98 F (36.7 C) (Oral)   Ht 5\' 2"  (1.575 m)   Wt 203 lb (92.1 kg)   SpO2 97%  BMI 37.13 kg/m   BP Readings from Last 3 Encounters:  04/29/23 128/71  04/22/23 126/70  04/15/23 120/70    Wt Readings from Last 3 Encounters:  04/29/23 201 lb 12.8 oz (91.5 kg)  04/22/23 204 lb 9.6 oz (92.8 kg)  04/15/23 203 lb (92.1 kg)    Physical Exam Constitutional:      General: She is not in acute distress.    Appearance: She is well-developed. She is obese.  HENT:     Head: Normocephalic.     Right Ear: External ear normal.     Left Ear: External ear normal.     Nose: Nose normal.  Eyes:     General:        Right eye: No discharge.        Left eye: No discharge.     Conjunctiva/sclera: Conjunctivae normal.      Pupils: Pupils are equal, round, and reactive to light.  Neck:     Thyroid : No thyromegaly.     Vascular: No JVD.     Trachea: No tracheal deviation.  Cardiovascular:     Rate and Rhythm: Normal rate and regular rhythm.     Heart sounds: Normal heart sounds.  Pulmonary:     Effort: No respiratory distress.     Breath sounds: No stridor. No wheezing.  Abdominal:     General: Bowel sounds are normal. There is no distension.     Palpations: Abdomen is soft. There is no mass.     Tenderness: There is no abdominal tenderness. There is no guarding or rebound.  Musculoskeletal:        General: Tenderness present.     Cervical back: Normal range of motion and neck supple. No rigidity.  Lymphadenopathy:     Cervical: No cervical adenopathy.  Skin:    Findings: No erythema or rash.  Neurological:     Cranial Nerves: No cranial nerve deficit.     Motor: No abnormal muscle tone.     Coordination: Coordination normal.     Deep Tendon Reflexes: Reflexes normal.  Psychiatric:        Behavior: Behavior normal.        Thought Content: Thought content normal.        Judgment: Judgment normal.   Antalgic gait  Lab Results  Component Value Date   WBC 4.7 01/15/2023   HGB 12.2 01/15/2023   HCT 38.0 01/15/2023   PLT 247.0 01/15/2023   GLUCOSE 97 01/15/2023   CHOL 188 09/17/2022   TRIG 75.0 09/17/2022   HDL 87.80 09/17/2022   LDLCALC 85 09/17/2022   ALT 10 01/15/2023   AST 14 01/15/2023   NA 141 01/15/2023   K 3.7 01/15/2023   CL 107 01/15/2023   CREATININE 0.79 01/15/2023   BUN 20 01/15/2023   CO2 26 01/15/2023   TSH 3.03 09/17/2022   INR 1.07 02/02/2013   HGBA1C 5.9 09/17/2022    CT CHEST ABDOMEN PELVIS W CONTRAST Result Date: 10/29/2022 CLINICAL DATA:  Follow-up colon carcinoma. Surveillance. * Tracking Code: BO * EXAM: CT CHEST, ABDOMEN, AND PELVIS WITH CONTRAST TECHNIQUE: Multidetector CT imaging of the chest, abdomen and pelvis was performed following the standard protocol  during bolus administration of intravenous contrast. RADIATION DOSE REDUCTION: This exam was performed according to the departmental dose-optimization program which includes automated exposure control, adjustment of the mA and/or kV according to patient size and/or use of iterative reconstruction technique. CONTRAST:  OMNIPAQUE  IOHEXOL  300 MG/ML  SOLN COMPARISON:  10/10/2021 FINDINGS: CT CHEST FINDINGS Cardiovascular: No acute findings. Mediastinum/Lymph Nodes: No masses or pathologically enlarged lymph nodes identified. Lungs/Pleura: No suspicious pulmonary nodules or masses identified. No evidence of infiltrate or pleural effusion. Musculoskeletal:  No suspicious bone lesions identified. CT ABDOMEN AND PELVIS FINDINGS Hepatobiliary: No masses identified. Prior cholecystectomy. No evidence of biliary obstruction. Pancreas:  No mass or inflammatory changes. Spleen:  Within normal limits in size and appearance. Adrenals/Urinary tract: No suspicious masses or hydronephrosis. Stomach/Bowel: Stable postop changes from previous left hemicolectomy. No mass identified. No evidence of obstruction, inflammatory process, or abnormal fluid collections. A small right lower quadrant ventral hernia is seen which contains a loop of small bowel. No evidence of bowel obstruction or strangulation. Vascular/Lymphatic: No pathologically enlarged lymph nodes identified. No acute vascular findings. Reproductive: Prior hysterectomy noted. Adnexal regions are unremarkable in appearance. Other:  None. Musculoskeletal:  No suspicious bone lesions identified. IMPRESSION: Stable exam. No evidence of recurrent or metastatic carcinoma within the chest, abdomen, or pelvis. Small right lower quadrant ventral hernia containing a loop of small bowel. No evidence of bowel obstruction or strangulation. Electronically Signed   By: Marlyce Sine M.D.   On: 10/29/2022 12:25    Assessment & Plan:   Problem List Items Addressed This Visit      Obesity   Wt Readings from Last 3 Encounters:  04/29/23 201 lb 12.8 oz (91.5 kg)  04/22/23 204 lb 9.6 oz (92.8 kg)  04/15/23 203 lb (92.1 kg)         Obstructive sleep apnea   Losing wt on diet On CPAP      Essential hypertension - Primary   Chronic  Cont on Irbesartan , HCT, Furosemide  prn, KCl        Relevant Medications   hydrochlorothiazide  (HYDRODIURIL ) 25 MG tablet   irbesartan  (AVAPRO ) 150 MG tablet   Pernicious anemia   On B12         Meds ordered this encounter  Medications   DISCONTD: hydrochlorothiazide  (HYDRODIURIL ) 25 MG tablet    Sig: Take 1 tablet (25 mg total) by mouth daily.    Dispense:  90 tablet    Refill:  3   DISCONTD: irbesartan  (AVAPRO ) 150 MG tablet    Sig: Take 1 tablet (150 mg total) by mouth daily.    Dispense:  90 tablet    Refill:  3   DISCONTD: Potassium Chloride  ER 20 MEQ TBCR    Sig: Take 1 tablet (20 mEq total) by mouth daily. TAKE 1 TABLET EVERY DAY AS NEEDED (TAKE W/ FUROSEMIDE )    Dispense:  90 tablet    Refill:  3   hydrochlorothiazide  (HYDRODIURIL ) 25 MG tablet    Sig: Take 1 tablet (25 mg total) by mouth daily.    Dispense:  90 tablet    Refill:  3   irbesartan  (AVAPRO ) 150 MG tablet    Sig: Take 1 tablet (150 mg total) by mouth daily.    Dispense:  90 tablet    Refill:  3   Potassium Chloride  ER 20 MEQ TBCR    Sig: Take 1 tablet (20 mEq total) by mouth daily. TAKE 1 TABLET EVERY DAY AS NEEDED (TAKE W/ FUROSEMIDE )    Dispense:  90 tablet    Refill:  3      Follow-up: Return in about 4 months (around 08/15/2023) for a follow-up visit.  Anitra Barn, MD

## 2023-04-20 NOTE — Progress Notes (Unsigned)
 Subjective:    Patient ID: [name], female    DOB: 05-09-1950, 73 y.o.   MRN: [id number]  HPI F never smoker followed for asthma, allergic rhinitis,, OSA, obesity/hypoventilation, complicated by GERD, arthritis, HBP, Colon Ca/ L hemicolectomy NPSG 12/12/05-AHI 38.4/hour, desaturation to 66%, body weight 250 pounds  ---------------------------------------------------------------------------  04/18/22- 73 year old female never smoker followed for OSA, complicated by Asthma, Allergic rhinitis,  obesity/hypoventilation,  GERD, arthritis, HBP, Colon Cancer/left ,    Allergy vaccine- Dr Dellis Anes -Symbicort 80, Neb albuterol, Ventolin hfa, Singgulair, Arovent nasal, Flonase, Zyrtec CPAP 9/[employer]  Download compliance- Body weight today-200 lbs Covid vax- [employer] says they called her several time and she never responded to replace old CPAP last year. No download available today.  She reports a "bad cold" last week which has now resolved.  She is feeling well but comes wearing a mask. She continues to use CPAP routinely and says she sleeps better with it.  Still would like to go forward with replacing old machine.  04/22/23- 72yoF never smoker followed for OSA, complicated by Asthma, Allergic rhinitis,  obesity/hypoventilation,  GERD, arthritis, HBP, Colon Cancer/left ,    Allergy vaccine- Dr Dellis Anes -Symbicort 80, Neb albuterol, Ventolin hfa, Singgulair, Arovent nasal, Flonase, Zyrtec CPAP 9/[employer]   [facility]una machine ([employer]) replaced Spring 2024 Download compliance- Body weight today-204 lbs Discussed the use of AI scribe software for clinical note transcription with the patient, who gave verbal consent to proceed.  History of Present Illness   The patient, with a history of sleep apnea and allergies, presents for a routine follow-up. She reports being comfortable with her CPAP machine and sleeps better with it than without it. She has recently switched insurance to [employer] and her  current antihistamine, Zyrtec (cetirizine), is no longer covered. She has been experiencing a runny nose, which she attributes to a change in the weather. She has not received allergy shots since July and is currently observing how she does without them. She also has prescriptions for an albuterol inhaler, a Symbicort inhaler, and Singulair pill for her breathing, which she reports no issues with.     ROS-see HPI   + = positive Constitutional:   No-   weight loss, night sweats, fevers, chills, fatigue, lassitude. HEENT:   No-  headaches, difficulty swallowing, tooth/dental problems, sore throat,       No-  sneezing, itching, ear ache, nasal congestion, + post nasal drip,  CV:  No-   chest pain, orthopnea, PND, swelling in lower extremities, anasarca, dizziness, palpitations Resp: No- acute  shortness of breath with exertion or at rest.              No-   productive cough,  + non-productive cough,  No- coughing up of blood.              No-   change in color of mucus.  No-sustained wheezing.   Skin: No-   rash or lesions. GI:  + heartburn, indigestion, no-abdominal pain, nausea, vomiting,  GU: No  complaint MS:  No-   joint pain or swelling.   Neuro-     nothing unusual Psych:  No- change in mood or affect. No depression or anxiety.  No memory loss.  OBJ- Physical Exam   similar to previous exams General- Alert, Oriented, Affect-appropriate, Distress- none acute, +obese Skin- rash-none, lesions- none, excoriation- none Lymphadenopathy- none Head- atraumatic            Eyes- Gross vision intact, PERRLA, conjunctivae and  secretions clear            Ears- Hearing, canals-normal            Nose- Clear, no-Septal dev, mucus, polyps, erosion, perforation             Throat- Mallampati III , mucosa clear,not dry , drainage- none, tonsils- atrophic,  Neck- flexible , trachea midline, no stridor , thyroid nl, carotid no bruit Chest - symmetrical excursion , unlabored           Heart/CV- RRR , no  murmur , no gallop  , no rub, nl s1 s2                           - JVD- none , edema- none, stasis changes- none, varices- none           Lung- clear to P&A, wheeze- none, cough-none , dullness-none, rub- none           Chest wall-  Abd-  Br/ Gen/ Rectal- Not done, not indicated Extrem- cyanosis- none, clubbing, none, atrophy- none, strength- nl Neuro- grossly intact to observation Assessment and Plan:    Obstructive Sleep Apnea She has been using CPAP with comfort and improved sleep. No issues with current settings. - Continue CPAP therapy with current settings (auto PAP range 5-15 cm H2O).  Asthma Current medications effective with no reported breathing problems. - Continue current asthma medications (albuterol inhaler, Symbicort inhaler, Singulair).  Allergic Rhinitis Experiencing nasal symptoms. Insurance does not cover cetirizine. Discussed switching to loratadine due to coverage. - Prescribe loratadine (Claritin) as an alternative to cetirizine. - [facility] [address] [facility] [address]. - Advise her to consider [facility] for potentially cheaper options.

## 2023-04-22 ENCOUNTER — Ambulatory Visit: Payer: Medicare PPO | Admitting: Internal Medicine

## 2023-04-22 ENCOUNTER — Encounter: Payer: Self-pay | Admitting: Internal Medicine

## 2023-04-22 VITALS — BP 126/70 | HR 77 | Temp 97.9°F | Ht 62.0 in | Wt 204.6 lb

## 2023-04-22 DIAGNOSIS — G4733 Obstructive sleep apnea (adult) (pediatric): Secondary | ICD-10-CM | POA: Diagnosis not present

## 2023-04-22 DIAGNOSIS — J45909 Unspecified asthma, uncomplicated: Secondary | ICD-10-CM

## 2023-04-22 MED ORDER — LORATADINE 10 MG PO TABS: 10.0000 mg | ORAL_TABLET | Freq: Every day | ORAL | 11 refills | Status: AC

## 2023-04-22 NOTE — Patient Instructions (Signed)
 We can continue CPAP auto 5-15  Script sent changing from cetirizine to loratadine (Claritin) antihistamine for allergy  Please call if we can help

## 2023-04-29 ENCOUNTER — Inpatient Hospital Stay: Payer: Self-pay

## 2023-04-29 ENCOUNTER — Inpatient Hospital Stay: Payer: Medicare PPO | Attending: Nurse Practitioner | Admitting: Nurse Practitioner

## 2023-04-29 ENCOUNTER — Encounter: Payer: Self-pay | Admitting: Nurse Practitioner

## 2023-04-29 ENCOUNTER — Other Ambulatory Visit: Payer: Medicare PPO

## 2023-04-29 VITALS — BP 128/71 | HR 80 | Temp 98.2°F | Resp 20 | Ht 62.0 in | Wt 201.8 lb

## 2023-04-29 DIAGNOSIS — C184 Malignant neoplasm of transverse colon: Secondary | ICD-10-CM | POA: Diagnosis not present

## 2023-04-29 DIAGNOSIS — Z85038 Personal history of other malignant neoplasm of large intestine: Secondary | ICD-10-CM | POA: Insufficient documentation

## 2023-04-29 DIAGNOSIS — Z9221 Personal history of antineoplastic chemotherapy: Secondary | ICD-10-CM | POA: Insufficient documentation

## 2023-04-29 DIAGNOSIS — Z862 Personal history of diseases of the blood and blood-forming organs and certain disorders involving the immune mechanism: Secondary | ICD-10-CM | POA: Insufficient documentation

## 2023-04-29 DIAGNOSIS — J45909 Unspecified asthma, uncomplicated: Secondary | ICD-10-CM | POA: Insufficient documentation

## 2023-04-29 DIAGNOSIS — Z08 Encounter for follow-up examination after completed treatment for malignant neoplasm: Secondary | ICD-10-CM | POA: Insufficient documentation

## 2023-04-29 LAB — CEA (ACCESS): CEA (CHCC): 1.63 ng/mL (ref 0.00–5.00)

## 2023-04-29 NOTE — Progress Notes (Signed)
 [facility] OFFICE PROGRESS NOTE   Diagnosis: Colon cancer  INTERVAL HISTORY:   [name] returns as scheduled.  She feels well.  No change in bowel habits.  No blood or pain with bowel movements.  No abdominal pain.  No nausea or vomiting.  She has a good appetite.  Objective:  Vital signs in last 24 hours:  Blood pressure 128/71, pulse 80, temperature 98.2 F (36.8 C), temperature source Temporal, resp. rate 20, height 5\' 2"  (1.575 m), weight 201 lb 12.8 oz (91.5 kg), SpO2 98%.     Lymphatics: No palpable cervical, supraclavicular, axillary or inguinal lymph nodes. Resp: Lungs clear bilaterally. Cardio: Regular rate and rhythm. GI: Abdomen soft and nontender.  No hepatosplenomegaly.  No mass. Vascular: Trace lower leg edema bilaterally.   Lab Results:  Lab Results  Component Value Date   WBC 4.7 01/15/2023   HGB 12.2 01/15/2023   HCT 38.0 01/15/2023   MCV 73.5 (L) 01/15/2023   PLT 247.0 01/15/2023   NEUTROABS 2.7 01/15/2023    Imaging:  No results found.  Medications: I have reviewed the patient's current medications.  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   Presentation to the emergency room 02/02/2013 with a colonic obstruction secondary to tumor at the splenic flexure.   Baseline CEA on 02/02/2013 less than 0.5.   Initiation of adjuvant Xeloda 04/10/2013.   Cycle 2 adjuvant Xeloda 05/01/2013.   Cycle 3 adjuvant Xeloda 05/22/2013.   Cycle 4 adjuvant Xeloda 06/12/2013.   Cycle 5 adjuvant Xeloda 07/03/2013 (Xeloda dose reduced due to hand foot syndrome).   Cycle 6 adjuvant Xeloda 07/24/2013.   Cycle 7 adjuvant Xeloda 08/14/2013   Cycle 8 adjuvant Xeloda  09/07/2013.  Surveillance colonoscopy 11/26/2016-patent end to side colocolonic anastomosis, characterized by healthy-appearing mucosa.  Examination otherwise normal.  Repeat colonoscopy in 5 years for surveillance.   History of iron deficiency anemia. Recurrent anemia 02/01/2014, improved, Persistent red cell microcytosis Asthma. Hand-foot syndrome secondary to Xeloda. Status post ostomy reversal 11/25/2013. CT 02/01/2014 with no evidence of local tumor recurrence or metastatic disease. New area of masslike thickening and small bowel dilatation at the mid small bowel Status post deep [facility]oscopy [facility] on 03/16/2014, negative. Status post capsule endoscopy 03/22/2014, confirmed a small bowel tumor Exploratory laparotomy with resection of a small bowel mass on 04/06/2014 with the pathology confirming invasive adenocarcinoma extending through small bowel wall and involving adjacent loops of adherent small bowel, 2 of 12 lymph nodes positive. History consistent with recurrent colon cancer. Loss of MLH1 and PMS 2, MSI high as was the January 2015 tumor CT abdomen/pelvis 02/03/2015-no evidence of recurrent colon cancer, ventral hernias CT chest/abdomen/pelvis 03/09/2018-no evidence of recurrent or metastatic disease.  Nonspecific 2 mm posterior right upper lobe nodule.  Continued attention on follow-up exams suggested. CT chest 09/13/2019-no evidence of metastatic disease CTs 09/11/2020-no evidence of thoracic metastases, no evidence of metastatic disease in the abdomen or pelvis CTs 10/10/2021-no evidence of metastatic disease CTs 10/29/2022-no evidence of metastatic disease  Disposition: [name] remains in clinical remission from colon cancer.  We will follow-up on the CEA from today.  Plan for surveillance CT scans prior to next office visit.  She will return for follow-up in 6 months.  Lonna Cobb ANP/GNP-BC   04/29/2023  1:52 PM

## 2023-04-30 ENCOUNTER — Telehealth: Payer: Self-pay

## 2023-04-30 NOTE — Telephone Encounter (Signed)
 Patient gave verbal understanding and had no further questions or concerns

## 2023-04-30 NOTE — Telephone Encounter (Signed)
-----   Message from Lonna Cobb sent at 04/29/2023  4:51 PM EDT ----- Please let her know CEA is stable in normal range, follow up as scheduled.

## 2023-05-18 ENCOUNTER — Encounter: Payer: Self-pay | Admitting: Internal Medicine

## 2023-05-18 NOTE — Assessment & Plan Note (Signed)
Losing wt on diet On CPAP

## 2023-05-18 NOTE — Assessment & Plan Note (Signed)
 Wt Readings from Last 3 Encounters:  04/29/23 201 lb 12.8 oz (91.5 kg)  04/22/23 204 lb 9.6 oz (92.8 kg)  04/15/23 203 lb (92.1 kg)

## 2023-05-18 NOTE — Assessment & Plan Note (Signed)
 On B12

## 2023-05-18 NOTE — Assessment & Plan Note (Signed)
Chronic  Cont on Irbesartan, HCT, Furosemide prn, KCl  . 

## 2023-06-18 ENCOUNTER — Ambulatory Visit: Payer: Medicare PPO | Admitting: Allergy & Immunology

## 2023-06-25 ENCOUNTER — Telehealth: Payer: Self-pay

## 2023-06-25 NOTE — Telephone Encounter (Signed)
 Please see below  Copied from CRM #[id number]. Topic: Clinical - Medication Question >> Jun 25, 2023  3:06 PM [name] wrote: Reason for CRM: [facility]  Pharmacy stated that the nurse/doctor would have to call [phone] so that she can start getting her scripts delivered to her through this company

## 2023-07-08 ENCOUNTER — Ambulatory Visit (INDEPENDENT_AMBULATORY_CARE_PROVIDER_SITE_OTHER)

## 2023-07-08 ENCOUNTER — Ambulatory Visit (INDEPENDENT_AMBULATORY_CARE_PROVIDER_SITE_OTHER): Admitting: Internal Medicine

## 2023-07-08 ENCOUNTER — Encounter: Payer: Self-pay | Admitting: Internal Medicine

## 2023-07-08 VITALS — BP 130/76 | HR 54 | Temp 99.1°F | Ht 62.0 in | Wt 196.0 lb

## 2023-07-08 DIAGNOSIS — M545 Low back pain, unspecified: Secondary | ICD-10-CM

## 2023-07-08 DIAGNOSIS — I10 Essential (primary) hypertension: Secondary | ICD-10-CM | POA: Diagnosis not present

## 2023-07-08 DIAGNOSIS — M25552 Pain in left hip: Secondary | ICD-10-CM

## 2023-07-08 DIAGNOSIS — G8929 Other chronic pain: Secondary | ICD-10-CM

## 2023-07-08 DIAGNOSIS — M25551 Pain in right hip: Secondary | ICD-10-CM

## 2023-07-08 MED ORDER — HYDROCODONE-ACETAMINOPHEN 5-325 MG PO TABS: 1.0000 | ORAL_TABLET | Freq: Four times a day (QID) | ORAL | 0 refills | Status: DC | PRN

## 2023-07-08 MED ORDER — MONTELUKAST SODIUM 10 MG PO TABS: ORAL_TABLET | ORAL | 3 refills | Status: AC

## 2023-07-08 MED ORDER — FAMOTIDINE 40 MG PO TABS: 40.0000 mg | ORAL_TABLET | Freq: Every day | ORAL | 3 refills | Status: AC

## 2023-07-08 MED ORDER — VITAMIN D3 50 MCG (2000 UT) PO CAPS: 2000.0000 [IU] | ORAL_CAPSULE | Freq: Every day | ORAL | 3 refills | Status: AC

## 2023-07-08 MED ORDER — POTASSIUM CHLORIDE ER 20 MEQ PO TBCR: 20.0000 meq | EXTENDED_RELEASE_TABLET | Freq: Every day | ORAL | 3 refills | Status: AC

## 2023-07-08 MED ORDER — OMEPRAZOLE 20 MG PO CPDR: 20.0000 mg | DELAYED_RELEASE_CAPSULE | Freq: Every day | ORAL | 3 refills | Status: AC

## 2023-07-08 MED ORDER — GABAPENTIN 300 MG PO CAPS: 300.0000 mg | ORAL_CAPSULE | Freq: Two times a day (BID) | ORAL | 2 refills | Status: AC

## 2023-07-08 MED ORDER — METHOCARBAMOL 500 MG PO TABS: 500.0000 mg | ORAL_TABLET | Freq: Three times a day (TID) | ORAL | 0 refills | Status: DC | PRN

## 2023-07-08 MED ORDER — METHYLPREDNISOLONE 4 MG PO TBPK: ORAL_TABLET | ORAL | 0 refills | Status: DC

## 2023-07-08 NOTE — Assessment & Plan Note (Signed)
Chronic  Cont on Irbesartan, HCT, Furosemide prn, KCl  . 

## 2023-07-08 NOTE — Progress Notes (Signed)
 Subjective:  Patient ID: [name], female    DOB: 05-Jan-1951  Age: 73 y.o. MRN: [id number]  CC: Medical Management of Chronic Issues (Referral for ortho, and wants to be prescribed a muscle relaxer )   HPI [name] presents for severe pain in the R hip and LS spine. No injury. Robaxin  helped. No irrad to the leg. Pain is worse w/ROM, severe at times  Outpatient Medications Prior to Visit  Medication Sig Dispense Refill   albuterol  (VENTOLIN  HFA) 108 (90 Base) MCG/ACT inhaler Inhale 2 puffs into the lungs every 4 (four) hours as needed for wheezing or shortness of breath. 36 g 1   aspirin  EC 81 MG tablet Take 81 mg by mouth in the morning. Swallow whole.     Cyanocobalamin  (VITAMIN B-12) 1000 MCG SUBL Place 1 tablet (1,000 mcg total) under the tongue daily. 100 tablet 3   ferrous sulfate  325 (65 FE) MG tablet Take 325 mg by mouth in the morning and at bedtime.     fluticasone  (FLONASE ) 50 MCG/ACT nasal spray Place 1 spray into both nostrils daily. 1-2 sprays each nostril once daily 48 g 1   furosemide  (LASIX ) 40 MG tablet TAKE 1 TABLET BY MOUTH DAILY AS  NEEDED FOR EDEMA 100 tablet 2   hydrochlorothiazide  (HYDRODIURIL ) 25 MG tablet Take 1 tablet (25 mg total) by mouth daily. 90 tablet 3   ipratropium (ATROVENT ) 0.06 % nasal spray Place 1 spray into both nostrils 3 (three) times daily as needed for rhinitis. 45 mL 3   irbesartan  (AVAPRO ) 150 MG tablet Take 1 tablet (150 mg total) by mouth daily. 90 tablet 3   loratadine  (CLARITIN ) 10 MG tablet Take 1 tablet (10 mg total) by mouth daily. 30 tablet 11   Vitamin D , Ergocalciferol , (DRISDOL ) 1.25 MG (50000 UNIT) CAPS capsule Take 1 capsule (50,000 Units total) by mouth every 7 (seven) days. 8 capsule 0   Cholecalciferol (VITAMIN D3) 50 MCG (2000 UT) capsule Take 1 capsule (2,000 Units total) by mouth daily. 100 capsule 3   famotidine  (PEPCID ) 40 MG tablet Take 1 tablet (40 mg total) by mouth daily. 90 tablet 3   gabapentin   (NEURONTIN ) 300 MG capsule Take 1 capsule (300 mg total) by mouth 2 (two) times daily. 180 capsule 2   montelukast  (SINGULAIR ) 10 MG tablet TAKE 1 TABLET ONE TIME DAILY WITH BREAKFAST 90 tablet 3   omeprazole  (PRILOSEC) 20 MG capsule Take 1 capsule (20 mg total) by mouth daily. 90 capsule 3   Potassium Chloride  ER 20 MEQ TBCR Take 1 tablet (20 mEq total) by mouth daily. TAKE 1 TABLET EVERY DAY AS NEEDED (TAKE W/ FUROSEMIDE ) 90 tablet 3   budesonide -formoterol  (SYMBICORT ) 80-4.5 MCG/ACT inhaler Inhale 2 puffs into the lungs 2 (two) times daily. 3 each 3   No facility-administered medications prior to visit.    ROS: Review of Systems  Constitutional:  Negative for activity change, appetite change, chills, fatigue and unexpected weight change.  HENT:  Negative for congestion, mouth sores and sinus pressure.   Eyes:  Negative for visual disturbance.  Respiratory:  Negative for cough and chest tightness.   Gastrointestinal:  Negative for abdominal pain and nausea.  Genitourinary:  Negative for difficulty urinating, frequency and vaginal pain.  Musculoskeletal:  Positive for arthralgias, back pain and gait problem.  Skin:  Negative for pallor and rash.  Neurological:  Negative for dizziness, tremors, weakness, numbness and headaches.  Psychiatric/Behavioral:  Negative for confusion, dysphoric mood and sleep  disturbance. The patient is not nervous/anxious.     Objective:  BP 130/76 (BP Location: Right Arm, Patient Position: Sitting, Cuff Size: Normal)   Pulse (!) 54   Temp 99.1 F (37.3 C) (Oral)   Ht 5\' 2"  (1.575 m)   Wt 196 lb (88.9 kg)   SpO2 97%   BMI 35.85 kg/m   BP Readings from Last 3 Encounters:  07/08/23 130/76  04/29/23 128/71  04/22/23 126/70    Wt Readings from Last 3 Encounters:  07/08/23 196 lb (88.9 kg)  04/29/23 201 lb 12.8 oz (91.5 kg)  04/22/23 204 lb 9.6 oz (92.8 kg)    Physical Exam Constitutional:      General: She is not in acute distress.     Appearance: She is well-developed. She is obese.  HENT:     Head: Normocephalic.     Right Ear: External ear normal.     Left Ear: External ear normal.     Nose: Nose normal.  Eyes:     General:        Right eye: No discharge.        Left eye: No discharge.     Conjunctiva/sclera: Conjunctivae normal.     Pupils: Pupils are equal, round, and reactive to light.  Neck:     Thyroid : No thyromegaly.     Vascular: No JVD.     Trachea: No tracheal deviation.  Cardiovascular:     Rate and Rhythm: Normal rate and regular rhythm.     Heart sounds: Normal heart sounds.  Pulmonary:     Effort: No respiratory distress.     Breath sounds: No stridor. No wheezing.  Abdominal:     General: Bowel sounds are normal. There is no distension.     Palpations: Abdomen is soft. There is no mass.     Tenderness: There is no abdominal tenderness. There is no guarding or rebound.  Musculoskeletal:        General: Tenderness present.     Cervical back: Normal range of motion and neck supple. No rigidity.     Right lower leg: Edema present.     Left lower leg: Edema present.  Lymphadenopathy:     Cervical: No cervical adenopathy.  Skin:    Findings: No erythema, lesion or rash.  Neurological:     Mental Status: Mental status is at baseline.     Cranial Nerves: No cranial nerve deficit.     Motor: No abnormal muscle tone.     Coordination: Coordination normal.     Deep Tendon Reflexes: Reflexes normal.  Psychiatric:        Behavior: Behavior normal.        Thought Content: Thought content normal.        Judgment: Judgment normal.   Trace edema B ankles LS spine w/pain. Str leg elev (-) B. R hip - pain w/ROM. R LS spine w/pain on ROM  Lab Results  Component Value Date   WBC 4.7 01/15/2023   HGB 12.2 01/15/2023   HCT 38.0 01/15/2023   PLT 247.0 01/15/2023   GLUCOSE 97 01/15/2023   CHOL 188 09/17/2022   TRIG 75.0 09/17/2022   HDL 87.80 09/17/2022   LDLCALC 85 09/17/2022   ALT 10  01/15/2023   AST 14 01/15/2023   NA 141 01/15/2023   K 3.7 01/15/2023   CL 107 01/15/2023   CREATININE 0.79 01/15/2023   BUN 20 01/15/2023   CO2 26 01/15/2023   TSH 3.03 09/17/2022  INR 1.07 02/02/2013   HGBA1C 5.9 09/17/2022    CT CHEST ABDOMEN PELVIS W CONTRAST Result Date: 10/29/2022 CLINICAL DATA:  Follow-up colon carcinoma. Surveillance. * Tracking Code: BO * EXAM: CT CHEST, ABDOMEN, AND PELVIS WITH CONTRAST TECHNIQUE: Multidetector CT imaging of the chest, abdomen and pelvis was performed following the standard protocol during bolus administration of intravenous contrast. RADIATION DOSE REDUCTION: This exam was performed according to the departmental dose-optimization program which includes automated exposure control, adjustment of the mA and/or kV according to patient size and/or use of iterative reconstruction technique. CONTRAST:  OMNIPAQUE  IOHEXOL  300 MG/ML  SOLN COMPARISON:  10/10/2021 FINDINGS: CT CHEST FINDINGS Cardiovascular: No acute findings. Mediastinum/Lymph Nodes: No masses or pathologically enlarged lymph nodes identified. Lungs/Pleura: No suspicious pulmonary nodules or masses identified. No evidence of infiltrate or pleural effusion. Musculoskeletal:  No suspicious bone lesions identified. CT ABDOMEN AND PELVIS FINDINGS Hepatobiliary: No masses identified. Prior cholecystectomy. No evidence of biliary obstruction. Pancreas:  No mass or inflammatory changes. Spleen:  Within normal limits in size and appearance. Adrenals/Urinary tract: No suspicious masses or hydronephrosis. Stomach/Bowel: Stable postop changes from previous left hemicolectomy. No mass identified. No evidence of obstruction, inflammatory process, or abnormal fluid collections. A small right lower quadrant ventral hernia is seen which contains a loop of small bowel. No evidence of bowel obstruction or strangulation. Vascular/Lymphatic: No pathologically enlarged lymph nodes identified. No acute vascular  findings. Reproductive: Prior hysterectomy noted. Adnexal regions are unremarkable in appearance. Other:  None. Musculoskeletal:  No suspicious bone lesions identified. IMPRESSION: Stable exam. No evidence of recurrent or metastatic carcinoma within the chest, abdomen, or pelvis. Small right lower quadrant ventral hernia containing a loop of small bowel. No evidence of bowel obstruction or strangulation. Electronically Signed   By: Marlyce Sine M.D.   On: 10/29/2022 12:25    Assessment & Plan:   Problem List Items Addressed This Visit     Essential hypertension   Chronic  Cont on Irbesartan , HCT, Furosemide  prn, KCl        Hip pain   R hip X ray Will ref to see Dr Romell Cluster prn Medrol  pack      Relevant Orders   Ambulatory referral to Orthopedic Surgery   XR HIP UNILAT W OR W/O PELVIS 2-3 VIEWS RIGHT   Low back pain - Primary   R hip X ray Will ref to see Dr Romell Cluster prn Medrol  pack      Relevant Medications   HYDROcodone -acetaminophen  (NORCO/VICODIN) 5-325 MG tablet   methylPREDNISolone  (MEDROL  DOSEPAK) 4 MG TBPK tablet   methocarbamol  (ROBAXIN ) 500 MG tablet   Other Relevant Orders   Ambulatory referral to Orthopedic Surgery   XR HIP UNILAT W OR W/O PELVIS 2-3 VIEWS RIGHT      Meds ordered this encounter  Medications   Cholecalciferol (VITAMIN D3) 50 MCG (2000 UT) capsule    Sig: Take 1 capsule (2,000 Units total) by mouth daily.    Dispense:  100 capsule    Refill:  3   famotidine  (PEPCID ) 40 MG tablet    Sig: Take 1 tablet (40 mg total) by mouth daily.    Dispense:  90 tablet    Refill:  3   gabapentin  (NEURONTIN ) 300 MG capsule    Sig: Take 1 capsule (300 mg total) by mouth 2 (two) times daily.    Dispense:  180 capsule    Refill:  2   omeprazole  (PRILOSEC) 20 MG capsule    Sig: Take 1  capsule (20 mg total) by mouth daily.    Dispense:  90 capsule    Refill:  3   Potassium Chloride  ER 20 MEQ TBCR    Sig: Take 1 tablet (20 mEq total) by mouth daily.  TAKE 1 TABLET EVERY DAY AS NEEDED (TAKE W/ FUROSEMIDE )    Dispense:  90 tablet    Refill:  3   montelukast  (SINGULAIR ) 10 MG tablet    Sig: TAKE 1 TABLET ONE TIME DAILY WITH BREAKFAST    Dispense:  90 tablet    Refill:  3   HYDROcodone -acetaminophen  (NORCO/VICODIN) 5-325 MG tablet    Sig: Take 1 tablet by mouth every 6 (six) hours as needed for severe pain (pain score 7-10).    Dispense:  20 tablet    Refill:  0   methylPREDNISolone  (MEDROL  DOSEPAK) 4 MG TBPK tablet    Sig: As directed    Dispense:  21 tablet    Refill:  0   methocarbamol  (ROBAXIN ) 500 MG tablet    Sig: Take 1 tablet (500 mg total) by mouth every 8 (eight) hours as needed for muscle spasms.    Dispense:  30 tablet    Refill:  0      Follow-up: Return in about 6 weeks (around 08/19/2023) for a follow-up visit.  Anitra Barn, MD

## 2023-07-08 NOTE — Assessment & Plan Note (Signed)
 R hip X ray Will ref to see Dr Romell Cluster prn Medrol  pack

## 2023-07-09 ENCOUNTER — Ambulatory Visit: Payer: Self-pay | Admitting: Internal Medicine

## 2023-08-06 ENCOUNTER — Encounter: Admitting: Orthopedic Surgery

## 2023-08-06 ENCOUNTER — Ambulatory Visit: Admitting: Allergy & Immunology

## 2023-08-19 ENCOUNTER — Ambulatory Visit: Admitting: Internal Medicine

## 2023-08-21 ENCOUNTER — Ambulatory Visit: Admitting: Internal Medicine

## 2023-08-25 ENCOUNTER — Ambulatory Visit: Admitting: Internal Medicine

## 2023-08-28 ENCOUNTER — Ambulatory Visit: Admitting: Orthopedic Surgery

## 2023-08-28 ENCOUNTER — Other Ambulatory Visit (INDEPENDENT_AMBULATORY_CARE_PROVIDER_SITE_OTHER): Payer: Self-pay

## 2023-08-28 DIAGNOSIS — M545 Low back pain, unspecified: Secondary | ICD-10-CM | POA: Diagnosis not present

## 2023-08-29 ENCOUNTER — Encounter: Payer: Self-pay | Admitting: Orthopedic Surgery
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
## 2023-08-31 ENCOUNTER — Ambulatory Visit (HOSPITAL_COMMUNITY): Payer: Self-pay

## 2023-09-05 ENCOUNTER — Ambulatory Visit (HOSPITAL_COMMUNITY)
Admission: RE | Admit: 2023-09-05 | Discharge: 2023-09-05 | Disposition: A | Discharge status: Home / Self Care | Source: Ambulatory Visit | Attending: Orthopedic Surgery | Admitting: Orthopedic Surgery

## 2023-09-05 DIAGNOSIS — M545 Low back pain, unspecified: Secondary | ICD-10-CM

## 2023-09-23 ENCOUNTER — Ambulatory Visit: Admitting: Physical Medicine and Rehabilitation

## 2023-09-24 ENCOUNTER — Encounter: Payer: Self-pay | Admitting: Physical Medicine and Rehabilitation

## 2023-09-24 ENCOUNTER — Ambulatory Visit: Admitting: Physical Medicine and Rehabilitation

## 2023-09-24 DIAGNOSIS — R269 Unspecified abnormalities of gait and mobility: Secondary | ICD-10-CM | POA: Diagnosis not present

## 2023-09-24 DIAGNOSIS — M5441 Lumbago with sciatica, right side: Secondary | ICD-10-CM | POA: Diagnosis not present

## 2023-09-24 DIAGNOSIS — M48062 Spinal stenosis, lumbar region with neurogenic claudication: Secondary | ICD-10-CM

## 2023-09-24 DIAGNOSIS — G8929 Other chronic pain: Secondary | ICD-10-CM | POA: Diagnosis not present

## 2023-09-24 DIAGNOSIS — M5416 Radiculopathy, lumbar region: Secondary | ICD-10-CM
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
## 2023-09-24 NOTE — Progress Notes (Unsigned)
 Pain Scale   Average Pain 6 Patient advising she has lower back pain radiating to right hip. Patient is here for MRI review        +Driver, -BT, -Dye Allergies.

## 2023-10-02 ENCOUNTER — Telehealth: Payer: Self-pay

## 2023-10-02 NOTE — Telephone Encounter (Signed)
 Copied from CRM [id number]. Topic: Clinical - Medication Question >> Oct 02, 2023  2:58 PM Turkey A wrote: Reason for CRM: Patient called and would like a refill on HYDROcodone -acetaminophen  (NORCO/VICODIN) 5-325 MG tablet. Patient has no refills agent offered to schedule appointment for patient. Patient said that her car is not working at this time. Please contat.  Last OV 07/08/23. Last refill 07/08/23 20tab

## 2023-10-03 ENCOUNTER — Telehealth: Payer: Self-pay | Admitting: Radiology

## 2023-10-03 MED ORDER — HYDROCODONE-ACETAMINOPHEN 5-325 MG PO TABS: 1.0000 | ORAL_TABLET | Freq: Four times a day (QID) | ORAL | 0 refills | Status: AC | PRN

## 2023-10-03 NOTE — Telephone Encounter (Signed)
 LVM making patient aware medication was sent in today

## 2023-10-03 NOTE — Addendum Note (Signed)
 Addended by: Rashawna Scoles V on: 10/03/2023 01:14 PM   Modules accepted: Orders

## 2023-10-03 NOTE — Telephone Encounter (Signed)
 Okay.  Thanks.

## 2023-10-03 NOTE — Telephone Encounter (Signed)
 Copied from CRM [id number]. Topic: Clinical - Medication Question >> Oct 02, 2023  2:58 PM Turkey A wrote: Reason for CRM: Patient called and would like a refill on HYDROcodone -acetaminophen  (NORCO/VICODIN) 5-325 MG tablet. Patient has no refills agent offered to schedule appointment for patient. Patient said that her car is not working at this time. Please contat >> Oct 03, 2023  1:11 PM Rosina BIRCH wrote: Patient called stating she is checking on a previous message about a medication refill

## 2023-10-07 ENCOUNTER — Telehealth: Payer: Self-pay | Admitting: Physical Medicine and Rehabilitation

## 2023-10-07 NOTE — Telephone Encounter (Signed)
 Patient will need to [facility] her appointment. She will not have a ride that date.

## 2023-10-20 ENCOUNTER — Encounter: Admitting: Physical Medicine and Rehabilitation

## 2023-10-22 ENCOUNTER — Encounter: Admitting: Physical Medicine and Rehabilitation

## 2023-10-26 ENCOUNTER — Ambulatory Visit (HOSPITAL_BASED_OUTPATIENT_CLINIC_OR_DEPARTMENT_OTHER)
Admission: RE | Admit: 2023-10-26 | Discharge: 2023-10-26 | Disposition: A | Discharge status: Home / Self Care | Source: Ambulatory Visit | Attending: Nurse Practitioner | Admitting: Nurse Practitioner

## 2023-10-26 DIAGNOSIS — C184 Malignant neoplasm of transverse colon: Secondary | ICD-10-CM | POA: Insufficient documentation

## 2023-10-26 MED ORDER — IOHEXOL 300 MG/ML  SOLN
100.0000 mL | Freq: Once | INTRAMUSCULAR | Status: AC | PRN
  Administered 2023-10-26: 100 mL via INTRAVENOUS

## 2023-10-30 ENCOUNTER — Ambulatory Visit: Admitting: Physical Medicine and Rehabilitation

## 2023-10-30 ENCOUNTER — Other Ambulatory Visit: Payer: Self-pay

## 2023-10-30 DIAGNOSIS — M5416 Radiculopathy, lumbar region: Secondary | ICD-10-CM | POA: Diagnosis not present

## 2023-10-30 MED ORDER — METHYLPREDNISOLONE ACETATE 80 MG/ML IJ SUSP
40.0000 mg | Freq: Once | INTRAMUSCULAR | Status: AC
  Administered 2023-10-30: 40 mg

## 2023-10-30 NOTE — Progress Notes (Signed)
 Pain Scale   Average Pain 4 Patient advised she lower back pain that radiates to her right hip at times patient advising her pain increasing when walking and decreases when sitting.        +Driver, -BT, -Dye Allergies.

## 2023-11-05 ENCOUNTER — Other Ambulatory Visit: Payer: Self-pay | Admitting: *Deleted

## 2023-11-05 ENCOUNTER — Inpatient Hospital Stay: Attending: Oncology | Admitting: Oncology

## 2023-11-05 ENCOUNTER — Other Ambulatory Visit: Admitting: Oncology

## 2023-11-05 ENCOUNTER — Inpatient Hospital Stay

## 2023-11-05 VITALS — BP 131/66 | HR 78 | Temp 97.8°F | Resp 18 | Ht 62.0 in | Wt 192.1 lb

## 2023-11-05 DIAGNOSIS — C184 Malignant neoplasm of transverse colon: Secondary | ICD-10-CM

## 2023-11-05 DIAGNOSIS — M549 Dorsalgia, unspecified: Secondary | ICD-10-CM | POA: Insufficient documentation

## 2023-11-05 DIAGNOSIS — G8929 Other chronic pain: Secondary | ICD-10-CM | POA: Insufficient documentation

## 2023-11-05 DIAGNOSIS — Z85038 Personal history of other malignant neoplasm of large intestine: Secondary | ICD-10-CM | POA: Insufficient documentation

## 2023-11-05 DIAGNOSIS — Z862 Personal history of diseases of the blood and blood-forming organs and certain disorders involving the immune mechanism: Secondary | ICD-10-CM | POA: Insufficient documentation

## 2023-11-05 DIAGNOSIS — Z08 Encounter for follow-up examination after completed treatment for malignant neoplasm: Secondary | ICD-10-CM | POA: Insufficient documentation

## 2023-11-05 DIAGNOSIS — J45909 Unspecified asthma, uncomplicated: Secondary | ICD-10-CM | POA: Insufficient documentation

## 2023-11-05 DIAGNOSIS — Z23 Encounter for immunization: Secondary | ICD-10-CM

## 2023-11-05 DIAGNOSIS — Z9221 Personal history of antineoplastic chemotherapy: Secondary | ICD-10-CM | POA: Diagnosis not present

## 2023-11-05 LAB — BASIC METABOLIC PANEL - CANCER CENTER ONLY
Anion gap: 12 (ref 5–15)
BUN: 10 mg/dL (ref 8–23)
CO2: 26 mmol/L (ref 22–32)
Calcium: 10 mg/dL (ref 8.9–10.3)
Chloride: 105 mmol/L (ref 98–111)
Creatinine: 0.82 mg/dL (ref 0.44–1.00)
GFR, Estimated: >60 mL/min (ref >60)
Glucose, Bld: 100 mg/dL — ABNORMAL HIGH (ref 70–99)
Potassium: 4 mmol/L (ref 3.5–5.1)
Sodium: 143 mmol/L (ref 135–145)

## 2023-11-05 LAB — CEA (ACCESS): CEA (CHCC): 1.63 ng/mL (ref 0.00–5.00)

## 2023-11-05 LAB — FERRITIN: Ferritin: 70 ng/mL (ref 11–307)

## 2023-11-05 MED ORDER — INFLUENZA VAC SPLIT HIGH-DOSE 0.5 ML IM SUSY
0.5000 mL | PREFILLED_SYRINGE | Freq: Once | INTRAMUSCULAR | Status: AC
  Administered 2023-11-05: 0.5 mL via INTRAMUSCULAR
  Filled 2023-11-05: qty 0.5

## 2023-11-05 NOTE — Progress Notes (Signed)
 [facility] OFFICE PROGRESS NOTE   Diagnosis: Colon cancer  INTERVAL HISTORY:   [name] returns as scheduled.  She feels well.  She has chronic back pain and had a steroid injection last week.  The pain has not improved.  Objective:  Vital signs in last 24 hours:  Blood pressure 131/66, pulse 78, temperature 97.8 F (36.6 C), temperature source Temporal, resp. rate 18, height 5' 2 (1.575 m), weight 192 lb 1.6 oz (87.1 kg), SpO2 100%.     Lymphatics: No cervical, supraclavicular, axillary, or inguinal nodes Resp: Lungs clear bilaterally Cardio: Regular rate and rhythm GI: No mass nontender, no hepatosplenomegaly Vascular: No leg edema   Lab Results:  Lab Results  Component Value Date   WBC 4.7 01/15/2023   HGB 12.2 01/15/2023   HCT 38.0 01/15/2023   MCV 73.5 (L) 01/15/2023   PLT 247.0 01/15/2023   NEUTROABS 2.7 01/15/2023    CMP  Lab Results  Component Value Date   NA 141 01/15/2023   K 3.7 01/15/2023   CL 107 01/15/2023   CO2 26 01/15/2023   GLUCOSE 97 01/15/2023   BUN 20 01/15/2023   CREATININE 0.79 01/15/2023   CALCIUM 9.4 01/15/2023   PROT 7.2 01/15/2023   ALBUMIN  4.0 01/15/2023   AST 14 01/15/2023   ALT 10 01/15/2023   ALKPHOS 140 (H) 01/15/2023   BILITOT 0.6 01/15/2023   GFRNONAA >60 10/29/2022   GFRAA >60 03/09/2018    Lab Results  Component Value Date   CEA1 1.46 09/11/2020   CEA 1.63 04/29/2023    Medications: I have reviewed the patient's current medications.   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   Presentation to the emergency room 02/02/2013 with a colonic obstruction secondary to tumor at the splenic flexure.   Baseline CEA on  02/02/2013 less than 0.5.   Initiation of adjuvant Xeloda  04/10/2013.   Cycle 2 adjuvant Xeloda  05/01/2013.   Cycle 3 adjuvant Xeloda  05/22/2013.   Cycle 4 adjuvant Xeloda  06/12/2013.   Cycle 5 adjuvant Xeloda  07/03/2013 (Xeloda  dose reduced due to hand foot syndrome).   Cycle 6 adjuvant Xeloda  07/24/2013.   Cycle 7 adjuvant Xeloda  08/14/2013   Cycle 8 adjuvant Xeloda  09/07/2013.  Surveillance colonoscopy 11/26/2016-patent end to side colocolonic anastomosis, characterized by healthy-appearing mucosa.  Examination otherwise normal.  Repeat colonoscopy in 5 years for surveillance.   History of iron deficiency anemia. Recurrent anemia 02/01/2014, improved, Persistent red cell microcytosis Asthma. Hand-foot syndrome secondary to Xeloda . Status post ostomy reversal 11/25/2013. CT 02/01/2014 with no evidence of local tumor recurrence or metastatic disease. New area of masslike thickening and small bowel dilatation at the mid small bowel Status post deep enteroscopy at [facility] on 03/16/2014, negative. Status post capsule endoscopy 03/22/2014, confirmed a small bowel tumor Exploratory laparotomy with resection of a small bowel mass on 04/06/2014 with the pathology confirming invasive adenocarcinoma extending through small bowel wall and involving adjacent loops of adherent small bowel, 2 of 12 lymph nodes positive. History consistent with recurrent colon cancer. Loss of MLH1 and PMS 2, MSI high as was the January 2015 tumor CT abdomen/pelvis 02/03/2015-no evidence of recurrent colon cancer, ventral hernias CT chest/abdomen/pelvis 03/09/2018-no evidence of recurrent or metastatic disease.  Nonspecific 2 mm posterior right upper lobe nodule.  Continued attention on follow-up exams suggested. CT chest 09/13/2019-no evidence of metastatic disease CTs 09/11/2020-no evidence of thoracic metastases, no evidence of metastatic disease in the abdomen or pelvis CTs 10/10/2021-no evidence of metastatic disease CTs  10/29/2022-no evidence of metastatic disease CTs 10/26/2023: No evidence of metastatic disease   Disposition: [name] remains in clinical remission from colon cancer.  The surveillance CTs revealed no evidence of recurrent disease.  We will follow-up on the CEA from today.  She will return for an office visit and CEA in 6 months. She has a history of iron deficiency and chronic red cell microcytosis.  We will check the ferritin today.  She will decrease ferrous sulfate  to once daily.  I suspect the chronic red cell microcytosis is related to a thalassemia variant.  Arley Hof, MD  11/05/2023  12:31 PM

## 2023-11-07 ENCOUNTER — Telehealth: Payer: Self-pay | Admitting: *Deleted

## 2023-11-07 NOTE — Telephone Encounter (Signed)
 Called [name] with her lab results: ferritin BMP and CEA all normal. F/U as scheduled.

## 2023-11-09 NOTE — Procedures (Signed)
 Lumbosacral Transforaminal Epidural Steroid Injection - Sub-Pedicular Approach with Fluoroscopic Guidance  Patient: [name]      Date of Birth: 1950-09-30 MRN: [id number] PCP: Garald Karlynn GAILS, MD      Visit Date: 10/30/2023   Universal Protocol:    Date/Time: 10/30/2023  Consent Given By: the patient  Position: PRONE  Additional Comments: Vital signs were monitored before and after the procedure. Patient was prepped and draped in the usual sterile fashion. The correct patient, procedure, and site was verified.   Injection Procedure Details:   Procedure diagnoses: Lumbar radiculopathy [M54.16]    Meds Administered:  Meds ordered this encounter  Medications   methylPREDNISolone  acetate (DEPO-MEDROL ) injection 40 mg    Laterality: Right  Location/Site: L3  Needle:5.0 in., 22 ga.  Short bevel or Quincke spinal needle  Needle Placement: Transforaminal  Findings:    -Comments: Excellent flow of contrast along the nerve, nerve root and into the epidural space.  Procedure Details: After squaring off the end-plates to get a true AP view, the C-arm was positioned so that an oblique view of the foramen as noted above was visualized. The target area is just inferior to the nose of the scotty dog or sub pedicular. The soft tissues overlying this structure were infiltrated with 2-3 ml. of 1% Lidocaine  without Epinephrine .  The spinal needle was inserted toward the target using a trajectory view along the fluoroscope beam.  Under AP and lateral visualization, the needle was advanced so it did not puncture dura and was located close the 6 O'Clock position of the pedical in AP tracterory. Biplanar projections were used to confirm position. Aspiration was confirmed to be negative for CSF and/or blood. A 1-2 ml. volume of Isovue-250 was injected and flow of contrast was noted at each level. Radiographs were obtained for documentation purposes.   After attaining the  desired flow of contrast documented above, a 0.5 to 1.0 ml test dose of 0.25% Marcaine  was injected into each respective transforaminal space.  The patient was observed for 90 seconds post injection.  After no sensory deficits were reported, and normal lower extremity motor function was noted,   the above injectate was administered so that equal amounts of the injectate were placed at each foramen (level) into the transforaminal epidural space.   Additional Comments:  The patient tolerated the procedure well Dressing: 2 x 2 sterile gauze and Band-Aid    Post-procedure details: Patient was observed during the procedure. Post-procedure instructions were reviewed.  Patient left the clinic in stable condition.

## 2023-11-09 NOTE — Progress Notes (Signed)
 [name] - 73 y.o. female MRN [id number]  Date of birth: 1950-06-21  Office Visit Note: Visit Date: 10/30/2023 PCP: Garald Karlynn GAILS, MD Referred by: Garald Karlynn GAILS, MD  Subjective: Chief Complaint  Patient presents with   Lower Back - Pain   HPI:  [name] is a 73 y.o. female who comes in today at the request of Duwaine Pouch, FNP and Dr. JUDITHANN Hamilton Dean for planned Right L3-4 Lumbar Transforaminal epidural steroid injection with fluoroscopic guidance.  The patient has failed conservative care including home exercise, medications, time and activity modification.  This injection will be diagnostic and hopefully therapeutic.  Please see requesting physician notes for further details and justification.   ROS Otherwise per HPI.  Assessment & Plan: Visit Diagnoses:    ICD-10-CM   1. Lumbar radiculopathy  M54.16 XR C-ARM NO REPORT    Epidural Steroid injection    methylPREDNISolone  acetate (DEPO-MEDROL ) injection 40 mg      Plan: No additional findings.   Meds & Orders:  Meds ordered this encounter  Medications   methylPREDNISolone  acetate (DEPO-MEDROL ) injection 40 mg    Orders Placed This Encounter  Procedures   XR C-ARM NO REPORT   Epidural Steroid injection    Follow-up: Return for visit to requesting provider as needed.   Procedures: No procedures performed  Lumbosacral Transforaminal Epidural Steroid Injection - Sub-Pedicular Approach with Fluoroscopic Guidance  Patient: [name]      Date of Birth: 1950/05/12 MRN: [id number] PCP: Garald Karlynn GAILS, MD      Visit Date: 10/30/2023   Universal Protocol:    Date/Time: 10/30/2023  Consent Given By: the patient  Position: PRONE  Additional Comments: Vital signs were monitored before and after the procedure. Patient was prepped and draped in the usual sterile fashion. The correct patient, procedure, and site was verified.   Injection Procedure Details:   Procedure  diagnoses: Lumbar radiculopathy [M54.16]    Meds Administered:  Meds ordered this encounter  Medications   methylPREDNISolone  acetate (DEPO-MEDROL ) injection 40 mg    Laterality: Right  Location/Site: L3  Needle:5.0 in., 22 ga.  Short bevel or Quincke spinal needle  Needle Placement: Transforaminal  Findings:    -Comments: Excellent flow of contrast along the nerve, nerve root and into the epidural space.  Procedure Details: After squaring off the end-plates to get a true AP view, the C-arm was positioned so that an oblique view of the foramen as noted above was visualized. The target area is just inferior to the nose of the scotty dog or sub pedicular. The soft tissues overlying this structure were infiltrated with 2-3 ml. of 1% Lidocaine  without Epinephrine .  The spinal needle was inserted toward the target using a trajectory view along the fluoroscope beam.  Under AP and lateral visualization, the needle was advanced so it did not puncture dura and was located close the 6 O'Clock position of the pedical in AP tracterory. Biplanar projections were used to confirm position. Aspiration was confirmed to be negative for CSF and/or blood. A 1-2 ml. volume of Isovue-250 was injected and flow of contrast was noted at each level. Radiographs were obtained for documentation purposes.   After attaining the desired flow of contrast documented above, a 0.5 to 1.0 ml test dose of 0.25% Marcaine  was injected into each respective transforaminal space.  The patient was observed for 90 seconds post injection.  After no sensory deficits were reported, and normal lower extremity motor function was noted,  the above injectate was administered so that equal amounts of the injectate were placed at each foramen (level) into the transforaminal epidural space.   Additional Comments:  The patient tolerated the procedure well Dressing: 2 x 2 sterile gauze and Band-Aid    Post-procedure details: Patient  was observed during the procedure. Post-procedure instructions were reviewed.  Patient left the clinic in stable condition.    Clinical History: CLINICAL DATA:  Low back pain   EXAM: MRI LUMBAR SPINE WITHOUT CONTRAST   TECHNIQUE: Multiplanar, multisequence MR imaging of the lumbar spine was performed. No intravenous contrast was administered.   COMPARISON:  April 05, 2007   FINDINGS: Bone marrow: No significant abnormality   Conus and cauda equina: There are Modic type 2 changes at L3-4   Paraspinal tissues: No significant abnormality   L1-L2: There is mild degenerative disc disease with a mild disc bulge. Mild facet arthropathy. No spinal stenosis or foraminal stenosis   L2-L3: There is moderate degenerative disc disease with mild disc bulge. There is mild facet arthropathy with facet enlargement and mild spinal stenosis. No significant foraminal stenosis   L3-L4: There is moderate degenerative disc disease with a mild disc bulge. There is facet arthropathy with facet enlargement and moderate spinal stenosis. No significant foraminal stenosis   L4-L5: There is moderate degenerative disc disease. 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 There is mild spinal stenosis. No significant foraminal stenosis   L5-S1: The disc height is preserved. There is some mineralization in the disc space. There is ankylosis of the facet joints. No spinal stenosis or foraminal stenosis   IMPRESSION: 1. 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 There is severe degenerative disc disease and moderate facet arthropathy at L3-4 with moderate spinal stenosis 3. There is mild spinal stenosis at L2-3 due to moderate degenerative disc disease, disc bulge and moderate facet arthropathy.     Electronically Signed   By: Nancyann Burns M.D.   On: 09/11/2023 10:54     Objective:  VS:  HT:    WT:   BMI:     BP:    HR: bpm  TEMP: ( )  RESP:  Physical Exam Constitutional:      Appearance: She is obese.      Imaging: No results found.

## 2023-12-01 ENCOUNTER — Encounter: Payer: Self-pay | Admitting: Radiology

## 2024-02-12 ENCOUNTER — Other Ambulatory Visit: Payer: Self-pay | Admitting: Internal Medicine

## 2024-04-22 ENCOUNTER — Ambulatory Visit

## 2024-04-22 ENCOUNTER — Encounter: Admitting: Internal Medicine
